# Patient Record
Sex: Female | Born: 1955 | Race: White | Hispanic: No | Marital: Married | Smoking: Never smoker
Health system: Southern US, Community
[De-identification: ages and names within clinical notes are randomized; demographics above are authoritative.]

## PROBLEM LIST (undated history)

## (undated) DIAGNOSIS — R112 Nausea with vomiting, unspecified: Secondary | ICD-10-CM

## (undated) DIAGNOSIS — I499 Cardiac arrhythmia, unspecified: Secondary | ICD-10-CM

## (undated) DIAGNOSIS — T8859XA Other complications of anesthesia, initial encounter: Secondary | ICD-10-CM

## (undated) DIAGNOSIS — R519 Headache, unspecified: Secondary | ICD-10-CM

## (undated) DIAGNOSIS — Z9889 Other specified postprocedural states: Secondary | ICD-10-CM

## (undated) DIAGNOSIS — E079 Disorder of thyroid, unspecified: Secondary | ICD-10-CM

## (undated) DIAGNOSIS — R0600 Dyspnea, unspecified: Secondary | ICD-10-CM

## (undated) DIAGNOSIS — Z87442 Personal history of urinary calculi: Secondary | ICD-10-CM

## (undated) DIAGNOSIS — E039 Hypothyroidism, unspecified: Secondary | ICD-10-CM

## (undated) DIAGNOSIS — M419 Scoliosis, unspecified: Secondary | ICD-10-CM

## (undated) DIAGNOSIS — C801 Malignant (primary) neoplasm, unspecified: Secondary | ICD-10-CM

## (undated) DIAGNOSIS — C50919 Malignant neoplasm of unspecified site of unspecified female breast: Secondary | ICD-10-CM

## (undated) HISTORY — DX: Disorder of thyroid, unspecified: E07.9

## (undated) HISTORY — PX: APPENDECTOMY: SHX54

## (undated) HISTORY — PX: WISDOM TOOTH EXTRACTION: SHX21

## (undated) HISTORY — PX: LAPAROSCOPIC APPENDECTOMY: SUR753

## (undated) HISTORY — PX: COLONOSCOPY: SHX174

## (undated) SURGERY — INSERTION, PLEURAL DRAINAGE CATHETER
Anesthesia: LOCAL | Laterality: Left

---

## 2005-08-05 ENCOUNTER — Encounter: Admission: RE | Admit: 2005-08-05 | Discharge: 2005-08-05 | Payer: Self-pay | Admitting: General Surgery

## 2006-08-11 ENCOUNTER — Encounter: Admission: RE | Admit: 2006-08-11 | Discharge: 2006-08-11 | Payer: Self-pay | Admitting: Unknown Physician Specialty

## 2007-08-18 ENCOUNTER — Encounter: Admission: RE | Admit: 2007-08-18 | Discharge: 2007-08-18 | Payer: Self-pay | Admitting: Unknown Physician Specialty

## 2008-08-27 ENCOUNTER — Encounter: Admission: RE | Admit: 2008-08-27 | Discharge: 2008-08-27 | Payer: Self-pay | Admitting: Unknown Physician Specialty

## 2009-09-05 ENCOUNTER — Encounter: Admission: RE | Admit: 2009-09-05 | Discharge: 2009-09-05 | Payer: Self-pay | Admitting: Unknown Physician Specialty

## 2010-10-29 ENCOUNTER — Encounter
Admission: RE | Admit: 2010-10-29 | Discharge: 2010-10-29 | Payer: Self-pay | Source: Home / Self Care | Attending: Unknown Physician Specialty | Admitting: Unknown Physician Specialty

## 2010-11-20 ENCOUNTER — Encounter: Payer: Self-pay | Admitting: Cardiology

## 2010-11-21 ENCOUNTER — Encounter: Payer: Self-pay | Admitting: Cardiology

## 2010-11-25 ENCOUNTER — Encounter: Payer: Self-pay | Admitting: Cardiology

## 2010-11-25 DIAGNOSIS — I4891 Unspecified atrial fibrillation: Secondary | ICD-10-CM

## 2010-11-27 DIAGNOSIS — R002 Palpitations: Secondary | ICD-10-CM | POA: Insufficient documentation

## 2010-11-30 ENCOUNTER — Encounter: Payer: Self-pay | Admitting: Cardiology

## 2010-11-30 ENCOUNTER — Encounter: Payer: Self-pay | Admitting: *Deleted

## 2010-11-30 ENCOUNTER — Ambulatory Visit (INDEPENDENT_AMBULATORY_CARE_PROVIDER_SITE_OTHER): Payer: BC Managed Care – PPO | Admitting: Cardiology

## 2010-11-30 VITALS — BP 105/74 | HR 68

## 2010-11-30 DIAGNOSIS — I4891 Unspecified atrial fibrillation: Secondary | ICD-10-CM | POA: Insufficient documentation

## 2010-11-30 DIAGNOSIS — E039 Hypothyroidism, unspecified: Secondary | ICD-10-CM | POA: Insufficient documentation

## 2010-11-30 DIAGNOSIS — Z7901 Long term (current) use of anticoagulants: Secondary | ICD-10-CM | POA: Insufficient documentation

## 2010-11-30 NOTE — Assessment & Plan Note (Signed)
I explained to the patient that at this point I would not commit her to long-term medications.  This was a single episode of atrial fibrillation and if it occurs again in the future he can be managed in the same fashion I also told the patient that even episode is no break in approximately 12 hours not to wait any longer than 24 to 48 hours to contact us and we consider chemical cardioversion with a type I C. Antiarrhythmic drug.  Consideration can be given to flecainide or Propafenone. I would also notes to make the patient a long-term antiarrhythmic drug therapy at this point in time.  I explained to her that the first time she ever takes flecainide or any other type of antiarrhythmic agent, this will need to be monitored in the emergency room or in hospital.  Thereafter, she can use the pill in the pocket approach. Briefly approach the subject of atrial fibrillation ablation, pulmonary vein isolation, but this is certainly not an option at this point in time.

## 2010-11-30 NOTE — Progress Notes (Deleted)
Subjective:      Patient ID: Caitlin [name] is a 55 y.o. female.  Chief Complaint: HPIthe patient {Common ambulatory SmartLinks:19316} ROS    Objective:    Physical Exam  Lab Review:  {Recent labs:19471::"not applicable"}    Assessment:     No diagnosis found.   Plan:     ***

## 2010-11-30 NOTE — Progress Notes (Signed)
The patient is a 55 year old female is been referred for evaluation of atrial fibrillation.  She had a recent echocardiogram done.  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 She had laboratory work done, this was essentially within normal limits.  There was a mild elevation of the ALT.  The patient however was hypothyroid with a TSH of 16.640. I reviewed an EKG from 217 2012.  This demonstrated atrial fibrillation with rapid ventricular response without any significant EKG changes.a follow-up EKG from the next day demonstrated normal sinus rhythm with no significant abnormalities.  The patient reports an episode Friday week ago in the day when she felt dizzy and developed palpitations.this was associated with a bad headache.  Her husband Dr. Neita Carp diagnosed her with it her fibrillation with rapid ventricular response.  This was confirmed by an electrocardiogram.  I noted he contacted Dr. Myrtis Ser.  The patient was given short-acting diltiazem 30 mg tablets for her she took 3 tablets then some hours later two tablets in another couple of hours later another two tablets.  Approximately 8 hours after the onset of symptoms the patient reverted back to normal sinus rhythm.  As outlined above this was confirmed on electrocardiogram the next day.   The patient stated he is currently doing well.  She has been started on Synthroid low dose.  She denies any palpitations shortness of breath orthopnea PND.  She said no recurrent episodes.  I reviewed her electrocardiogram and the patient was in normal sinus rhythm.  There is no evidence of preexcitation by EKG.

## 2010-11-30 NOTE — Assessment & Plan Note (Signed)
I discussed with the patient that at this point all she needs is aspirin.  She has no indication for chronic anticoagulation.

## 2010-11-30 NOTE — Progress Notes (Deleted)
Subjective:      Patient ID: Caitlin [name] is a 55 y.o. female.  Chief Complaint: HPI {Common ambulatory SmartLinks:19316} Review of Systems  All other systems reviewed and are negative.      Objective:    Physical Exam  Lab Review:  {Recent labs:19471::"not applicable"}    Assessment:     1. Chronic anticoagulation   2. Atrial fibrillation with RVR   3. Hypothyroidism      Plan:     ***

## 2010-11-30 NOTE — Assessment & Plan Note (Signed)
Synthroid has been started.  This will be further followed by Dr. Neita Carp.

## 2010-11-30 NOTE — Progress Notes (Deleted)
Subjective:      Patient ID: Caitlin [name] is a 55 y.o. female.  Chief Complaint: HPI   ROS    Objective:    Physical Exam  Lab Review:  {Recent labs:19471::"not applicable"}    Assessment:     1. Chronic anticoagulation   2. Atrial fibrillation with RVR   3. Hypothyroidism      Plan:     ***

## 2010-12-10 NOTE — Assessment & Plan Note (Signed)
Summary: NP-ABN EKG  AFIB PER DEGENT ADD THIS DAY-SRS   Visit Type:  Initial Consult Primary Provider:  Guido Sander   History of Present Illness: Note was done in EPIC.   put nothing in his  Preventive Screening-Counseling & Management  Alcohol-Tobacco     Smoking Status: never  Current Medications (verified): 1)  Synthroid 25 Mcg Tabs (Levothyroxine Sodium) .... Take 1 Tablet By Mouth Once A Day 2)  Aspir-Low 81 Mg Tbec (Aspirin) .... Take 1 Tablet By Mouth Once A Day 3)  Advair Hfa 115-21 Mcg/act Aero (Fluticasone-Salmeterol) .... As Needed  Allergies (verified): 1)  ! Pcn  Comments:  Nurse/Medical Assistant: The patient's medications and allergies were verbally reviewed with the patient and were updated in the Medication and Allergy Lists.  Past History:  Past Medical History: Last updated: 11/27/2010 Palpitations  Social History: Smoking Status:  never  Vital Signs:  Patient profile:   55 year old female Height:      65 inches Weight:      167 pounds BMI:     27.89 O2 Sat:      97 % on Room air Pulse rate:   68 / minute BP sitting:   105 / 74  (left arm) Cuff size:   regular  Vitals Entered By: Carlye Grippe (November 30, 2010 10:09 AM)  Nutrition Counseling: [name] BMI is greater than 25 and therefore counseled on weight management options.  O2 Flow:  Room air   Other Orders: EKG w/ Interpretation (93000)  Patient Instructions: 1)  Your physician recommends that you continue on your current medications as directed. Please refer to the Current Medication list given to you today. 2)  Follow up in  6 months

## 2011-12-13 ENCOUNTER — Other Ambulatory Visit: Payer: Self-pay | Admitting: Unknown Physician Specialty

## 2011-12-13 DIAGNOSIS — Z1231 Encounter for screening mammogram for malignant neoplasm of breast: Secondary | ICD-10-CM

## 2012-06-09 ENCOUNTER — Ambulatory Visit
Admission: RE | Admit: 2012-06-09 | Discharge: 2012-06-09 | Disposition: A | Discharge status: Home / Self Care | Payer: BC Managed Care – PPO | Source: Ambulatory Visit | Attending: Unknown Physician Specialty | Admitting: Unknown Physician Specialty

## 2012-06-09 DIAGNOSIS — Z1231 Encounter for screening mammogram for malignant neoplasm of breast: Secondary | ICD-10-CM

## 2013-06-07 ENCOUNTER — Other Ambulatory Visit: Payer: Self-pay

## 2013-06-07 DIAGNOSIS — Z1231 Encounter for screening mammogram for malignant neoplasm of breast: Secondary | ICD-10-CM

## 2013-06-29 ENCOUNTER — Ambulatory Visit
Admission: RE | Admit: 2013-06-29 | Discharge: 2013-06-29 | Disposition: A | Discharge status: Home / Self Care | Payer: BC Managed Care – PPO | Source: Ambulatory Visit

## 2013-06-29 DIAGNOSIS — Z1231 Encounter for screening mammogram for malignant neoplasm of breast: Secondary | ICD-10-CM

## 2014-06-06 ENCOUNTER — Other Ambulatory Visit: Payer: Self-pay

## 2014-06-06 DIAGNOSIS — Z1231 Encounter for screening mammogram for malignant neoplasm of breast: Secondary | ICD-10-CM

## 2014-07-01 ENCOUNTER — Ambulatory Visit
Admission: RE | Admit: 2014-07-01 | Discharge: 2014-07-01 | Disposition: A | Discharge status: Home / Self Care | Payer: BC Managed Care – PPO | Source: Ambulatory Visit

## 2014-07-01 DIAGNOSIS — Z1231 Encounter for screening mammogram for malignant neoplasm of breast: Secondary | ICD-10-CM

## 2014-07-02 ENCOUNTER — Other Ambulatory Visit: Payer: Self-pay | Admitting: Unknown Physician Specialty

## 2014-07-02 DIAGNOSIS — R928 Other abnormal and inconclusive findings on diagnostic imaging of breast: Secondary | ICD-10-CM

## 2014-07-08 ENCOUNTER — Ambulatory Visit
Admission: RE | Admit: 2014-07-08 | Discharge: 2014-07-08 | Disposition: A | Discharge status: Home / Self Care | Payer: BC Managed Care – PPO | Source: Ambulatory Visit | Attending: Unknown Physician Specialty | Admitting: Unknown Physician Specialty

## 2014-07-08 DIAGNOSIS — R928 Other abnormal and inconclusive findings on diagnostic imaging of breast: Secondary | ICD-10-CM

## 2015-06-13 ENCOUNTER — Other Ambulatory Visit: Payer: Self-pay

## 2015-06-13 DIAGNOSIS — Z1231 Encounter for screening mammogram for malignant neoplasm of breast: Secondary | ICD-10-CM

## 2015-07-15 ENCOUNTER — Ambulatory Visit
Admission: RE | Admit: 2015-07-15 | Discharge: 2015-07-15 | Disposition: A | Discharge status: Home / Self Care | Payer: BLUE CROSS/BLUE SHIELD | Source: Ambulatory Visit

## 2015-07-15 DIAGNOSIS — Z1231 Encounter for screening mammogram for malignant neoplasm of breast: Secondary | ICD-10-CM

## 2016-03-09 DIAGNOSIS — L821 Other seborrheic keratosis: Secondary | ICD-10-CM | POA: Diagnosis not present

## 2016-07-20 DIAGNOSIS — Z23 Encounter for immunization: Secondary | ICD-10-CM | POA: Diagnosis not present

## 2016-11-29 ENCOUNTER — Other Ambulatory Visit: Payer: Self-pay | Admitting: Unknown Physician Specialty

## 2016-11-29 DIAGNOSIS — Z1231 Encounter for screening mammogram for malignant neoplasm of breast: Secondary | ICD-10-CM

## 2016-12-13 ENCOUNTER — Ambulatory Visit
Admission: RE | Admit: 2016-12-13 | Discharge: 2016-12-13 | Disposition: A | Discharge status: Home / Self Care | Payer: BLUE CROSS/BLUE SHIELD | Source: Ambulatory Visit | Attending: Unknown Physician Specialty | Admitting: Unknown Physician Specialty

## 2016-12-13 DIAGNOSIS — Z1231 Encounter for screening mammogram for malignant neoplasm of breast: Secondary | ICD-10-CM

## 2017-01-27 DIAGNOSIS — H4311 Vitreous hemorrhage, right eye: Secondary | ICD-10-CM | POA: Diagnosis not present

## 2017-02-08 DIAGNOSIS — L820 Inflamed seborrheic keratosis: Secondary | ICD-10-CM | POA: Diagnosis not present

## 2017-02-14 DIAGNOSIS — H43811 Vitreous degeneration, right eye: Secondary | ICD-10-CM | POA: Diagnosis not present

## 2017-02-14 DIAGNOSIS — H2513 Age-related nuclear cataract, bilateral: Secondary | ICD-10-CM | POA: Diagnosis not present

## 2017-02-16 DIAGNOSIS — H6121 Impacted cerumen, right ear: Secondary | ICD-10-CM | POA: Diagnosis not present

## 2017-02-16 DIAGNOSIS — H9203 Otalgia, bilateral: Secondary | ICD-10-CM | POA: Diagnosis not present

## 2017-02-16 DIAGNOSIS — Z6826 Body mass index (BMI) 26.0-26.9, adult: Secondary | ICD-10-CM | POA: Diagnosis not present

## 2017-04-04 DIAGNOSIS — Z79899 Other long term (current) drug therapy: Secondary | ICD-10-CM | POA: Diagnosis not present

## 2017-04-04 DIAGNOSIS — K37 Unspecified appendicitis: Secondary | ICD-10-CM | POA: Diagnosis not present

## 2017-04-04 DIAGNOSIS — Z7951 Long term (current) use of inhaled steroids: Secondary | ICD-10-CM | POA: Diagnosis not present

## 2017-04-04 DIAGNOSIS — R1013 Epigastric pain: Secondary | ICD-10-CM | POA: Diagnosis not present

## 2017-04-04 DIAGNOSIS — M419 Scoliosis, unspecified: Secondary | ICD-10-CM | POA: Diagnosis not present

## 2017-04-04 DIAGNOSIS — R1031 Right lower quadrant pain: Secondary | ICD-10-CM | POA: Diagnosis not present

## 2017-04-04 DIAGNOSIS — J4530 Mild persistent asthma, uncomplicated: Secondary | ICD-10-CM | POA: Diagnosis not present

## 2017-04-04 DIAGNOSIS — Z880 Allergy status to penicillin: Secondary | ICD-10-CM | POA: Diagnosis not present

## 2017-04-04 DIAGNOSIS — N200 Calculus of kidney: Secondary | ICD-10-CM | POA: Diagnosis not present

## 2017-04-04 DIAGNOSIS — R1909 Other intra-abdominal and pelvic swelling, mass and lump: Secondary | ICD-10-CM | POA: Diagnosis not present

## 2017-04-04 DIAGNOSIS — I700 Atherosclerosis of aorta: Secondary | ICD-10-CM | POA: Diagnosis not present

## 2017-04-04 DIAGNOSIS — K353 Acute appendicitis with localized peritonitis: Secondary | ICD-10-CM | POA: Diagnosis not present

## 2017-04-04 DIAGNOSIS — Z8042 Family history of malignant neoplasm of prostate: Secondary | ICD-10-CM | POA: Diagnosis not present

## 2017-04-04 DIAGNOSIS — K3580 Unspecified acute appendicitis: Secondary | ICD-10-CM | POA: Diagnosis not present

## 2017-04-04 DIAGNOSIS — E039 Hypothyroidism, unspecified: Secondary | ICD-10-CM | POA: Diagnosis not present

## 2017-04-04 DIAGNOSIS — I480 Paroxysmal atrial fibrillation: Secondary | ICD-10-CM | POA: Diagnosis not present

## 2017-04-04 DIAGNOSIS — G250 Essential tremor: Secondary | ICD-10-CM | POA: Diagnosis not present

## 2017-04-04 DIAGNOSIS — M7989 Other specified soft tissue disorders: Secondary | ICD-10-CM | POA: Diagnosis not present

## 2017-04-04 DIAGNOSIS — Z6826 Body mass index (BMI) 26.0-26.9, adult: Secondary | ICD-10-CM | POA: Diagnosis not present

## 2017-04-05 DIAGNOSIS — K3580 Unspecified acute appendicitis: Secondary | ICD-10-CM | POA: Diagnosis not present

## 2017-04-05 DIAGNOSIS — Z880 Allergy status to penicillin: Secondary | ICD-10-CM | POA: Diagnosis not present

## 2017-04-05 DIAGNOSIS — Z7951 Long term (current) use of inhaled steroids: Secondary | ICD-10-CM | POA: Diagnosis not present

## 2017-04-05 DIAGNOSIS — E039 Hypothyroidism, unspecified: Secondary | ICD-10-CM | POA: Diagnosis not present

## 2017-04-05 DIAGNOSIS — J4530 Mild persistent asthma, uncomplicated: Secondary | ICD-10-CM | POA: Diagnosis not present

## 2017-04-05 DIAGNOSIS — I480 Paroxysmal atrial fibrillation: Secondary | ICD-10-CM | POA: Diagnosis not present

## 2017-04-05 DIAGNOSIS — G250 Essential tremor: Secondary | ICD-10-CM | POA: Diagnosis not present

## 2017-04-05 DIAGNOSIS — Z79899 Other long term (current) drug therapy: Secondary | ICD-10-CM | POA: Diagnosis not present

## 2017-04-05 DIAGNOSIS — R1909 Other intra-abdominal and pelvic swelling, mass and lump: Secondary | ICD-10-CM | POA: Diagnosis not present

## 2017-04-05 DIAGNOSIS — Z8042 Family history of malignant neoplasm of prostate: Secondary | ICD-10-CM | POA: Diagnosis not present

## 2017-04-05 DIAGNOSIS — K353 Acute appendicitis with localized peritonitis: Secondary | ICD-10-CM | POA: Diagnosis not present

## 2017-06-30 DIAGNOSIS — H00012 Hordeolum externum right lower eyelid: Secondary | ICD-10-CM | POA: Diagnosis not present

## 2017-07-01 DIAGNOSIS — L570 Actinic keratosis: Secondary | ICD-10-CM | POA: Diagnosis not present

## 2017-07-01 DIAGNOSIS — D485 Neoplasm of uncertain behavior of skin: Secondary | ICD-10-CM | POA: Diagnosis not present

## 2017-07-01 DIAGNOSIS — L821 Other seborrheic keratosis: Secondary | ICD-10-CM | POA: Diagnosis not present

## 2017-07-04 DIAGNOSIS — H00012 Hordeolum externum right lower eyelid: Secondary | ICD-10-CM | POA: Diagnosis not present

## 2017-07-18 DIAGNOSIS — Z23 Encounter for immunization: Secondary | ICD-10-CM | POA: Diagnosis not present

## 2017-08-01 DIAGNOSIS — Z13220 Encounter for screening for lipoid disorders: Secondary | ICD-10-CM | POA: Diagnosis not present

## 2017-08-01 DIAGNOSIS — E039 Hypothyroidism, unspecified: Secondary | ICD-10-CM | POA: Diagnosis not present

## 2017-08-01 DIAGNOSIS — R5382 Chronic fatigue, unspecified: Secondary | ICD-10-CM | POA: Diagnosis not present

## 2018-05-26 DIAGNOSIS — H00025 Hordeolum internum left lower eyelid: Secondary | ICD-10-CM | POA: Diagnosis not present

## 2018-06-13 ENCOUNTER — Other Ambulatory Visit: Payer: Self-pay | Admitting: Unknown Physician Specialty

## 2018-06-13 DIAGNOSIS — Z1231 Encounter for screening mammogram for malignant neoplasm of breast: Secondary | ICD-10-CM

## 2018-07-10 ENCOUNTER — Ambulatory Visit
Admission: RE | Admit: 2018-07-10 | Discharge: 2018-07-10 | Disposition: A | Discharge status: Home / Self Care | Payer: BLUE CROSS/BLUE SHIELD | Source: Ambulatory Visit | Attending: Unknown Physician Specialty | Admitting: Unknown Physician Specialty

## 2018-07-10 ENCOUNTER — Encounter (INDEPENDENT_AMBULATORY_CARE_PROVIDER_SITE_OTHER): Payer: Self-pay

## 2018-07-10 DIAGNOSIS — Z23 Encounter for immunization: Secondary | ICD-10-CM | POA: Diagnosis not present

## 2018-07-10 DIAGNOSIS — Z1231 Encounter for screening mammogram for malignant neoplasm of breast: Secondary | ICD-10-CM | POA: Diagnosis not present

## 2018-07-17 DIAGNOSIS — Z01419 Encounter for gynecological examination (general) (routine) without abnormal findings: Secondary | ICD-10-CM | POA: Diagnosis not present

## 2018-07-17 DIAGNOSIS — Z6826 Body mass index (BMI) 26.0-26.9, adult: Secondary | ICD-10-CM | POA: Diagnosis not present

## 2018-08-07 DIAGNOSIS — M9906 Segmental and somatic dysfunction of lower extremity: Secondary | ICD-10-CM | POA: Diagnosis not present

## 2018-08-07 DIAGNOSIS — M9901 Segmental and somatic dysfunction of cervical region: Secondary | ICD-10-CM | POA: Diagnosis not present

## 2018-08-07 DIAGNOSIS — M9905 Segmental and somatic dysfunction of pelvic region: Secondary | ICD-10-CM | POA: Diagnosis not present

## 2018-08-07 DIAGNOSIS — M4126 Other idiopathic scoliosis, lumbar region: Secondary | ICD-10-CM | POA: Diagnosis not present

## 2018-08-28 DIAGNOSIS — S335XXA Sprain of ligaments of lumbar spine, initial encounter: Secondary | ICD-10-CM | POA: Diagnosis not present

## 2018-08-28 DIAGNOSIS — S134XXA Sprain of ligaments of cervical spine, initial encounter: Secondary | ICD-10-CM | POA: Diagnosis not present

## 2018-08-28 DIAGNOSIS — M4125 Other idiopathic scoliosis, thoracolumbar region: Secondary | ICD-10-CM | POA: Diagnosis not present

## 2018-08-30 DIAGNOSIS — S335XXA Sprain of ligaments of lumbar spine, initial encounter: Secondary | ICD-10-CM | POA: Diagnosis not present

## 2018-08-30 DIAGNOSIS — M4125 Other idiopathic scoliosis, thoracolumbar region: Secondary | ICD-10-CM | POA: Diagnosis not present

## 2018-08-30 DIAGNOSIS — S134XXA Sprain of ligaments of cervical spine, initial encounter: Secondary | ICD-10-CM | POA: Diagnosis not present

## 2018-09-04 DIAGNOSIS — M4125 Other idiopathic scoliosis, thoracolumbar region: Secondary | ICD-10-CM | POA: Diagnosis not present

## 2018-09-04 DIAGNOSIS — S335XXA Sprain of ligaments of lumbar spine, initial encounter: Secondary | ICD-10-CM | POA: Diagnosis not present

## 2018-09-04 DIAGNOSIS — S134XXA Sprain of ligaments of cervical spine, initial encounter: Secondary | ICD-10-CM | POA: Diagnosis not present

## 2018-09-11 DIAGNOSIS — M4125 Other idiopathic scoliosis, thoracolumbar region: Secondary | ICD-10-CM | POA: Diagnosis not present

## 2018-09-11 DIAGNOSIS — S335XXA Sprain of ligaments of lumbar spine, initial encounter: Secondary | ICD-10-CM | POA: Diagnosis not present

## 2018-09-11 DIAGNOSIS — S134XXA Sprain of ligaments of cervical spine, initial encounter: Secondary | ICD-10-CM | POA: Diagnosis not present

## 2018-09-13 DIAGNOSIS — M4125 Other idiopathic scoliosis, thoracolumbar region: Secondary | ICD-10-CM | POA: Diagnosis not present

## 2018-09-13 DIAGNOSIS — S134XXA Sprain of ligaments of cervical spine, initial encounter: Secondary | ICD-10-CM | POA: Diagnosis not present

## 2018-09-13 DIAGNOSIS — S335XXA Sprain of ligaments of lumbar spine, initial encounter: Secondary | ICD-10-CM | POA: Diagnosis not present

## 2018-09-18 DIAGNOSIS — M4125 Other idiopathic scoliosis, thoracolumbar region: Secondary | ICD-10-CM | POA: Diagnosis not present

## 2018-09-18 DIAGNOSIS — S134XXA Sprain of ligaments of cervical spine, initial encounter: Secondary | ICD-10-CM | POA: Diagnosis not present

## 2018-09-18 DIAGNOSIS — S335XXA Sprain of ligaments of lumbar spine, initial encounter: Secondary | ICD-10-CM | POA: Diagnosis not present

## 2018-09-20 DIAGNOSIS — M4125 Other idiopathic scoliosis, thoracolumbar region: Secondary | ICD-10-CM | POA: Diagnosis not present

## 2018-09-20 DIAGNOSIS — S335XXA Sprain of ligaments of lumbar spine, initial encounter: Secondary | ICD-10-CM | POA: Diagnosis not present

## 2018-09-20 DIAGNOSIS — S134XXA Sprain of ligaments of cervical spine, initial encounter: Secondary | ICD-10-CM | POA: Diagnosis not present

## 2018-10-09 DIAGNOSIS — M4125 Other idiopathic scoliosis, thoracolumbar region: Secondary | ICD-10-CM | POA: Diagnosis not present

## 2018-10-09 DIAGNOSIS — S335XXA Sprain of ligaments of lumbar spine, initial encounter: Secondary | ICD-10-CM | POA: Diagnosis not present

## 2018-10-09 DIAGNOSIS — S134XXA Sprain of ligaments of cervical spine, initial encounter: Secondary | ICD-10-CM | POA: Diagnosis not present

## 2018-10-12 DIAGNOSIS — S134XXA Sprain of ligaments of cervical spine, initial encounter: Secondary | ICD-10-CM | POA: Diagnosis not present

## 2018-10-12 DIAGNOSIS — M4125 Other idiopathic scoliosis, thoracolumbar region: Secondary | ICD-10-CM | POA: Diagnosis not present

## 2018-10-12 DIAGNOSIS — S335XXA Sprain of ligaments of lumbar spine, initial encounter: Secondary | ICD-10-CM | POA: Diagnosis not present

## 2018-10-19 DIAGNOSIS — M4125 Other idiopathic scoliosis, thoracolumbar region: Secondary | ICD-10-CM | POA: Diagnosis not present

## 2018-10-19 DIAGNOSIS — S335XXA Sprain of ligaments of lumbar spine, initial encounter: Secondary | ICD-10-CM | POA: Diagnosis not present

## 2018-10-19 DIAGNOSIS — S134XXA Sprain of ligaments of cervical spine, initial encounter: Secondary | ICD-10-CM | POA: Diagnosis not present

## 2018-10-26 DIAGNOSIS — S335XXA Sprain of ligaments of lumbar spine, initial encounter: Secondary | ICD-10-CM | POA: Diagnosis not present

## 2018-10-26 DIAGNOSIS — S134XXA Sprain of ligaments of cervical spine, initial encounter: Secondary | ICD-10-CM | POA: Diagnosis not present

## 2018-10-26 DIAGNOSIS — M4125 Other idiopathic scoliosis, thoracolumbar region: Secondary | ICD-10-CM | POA: Diagnosis not present

## 2018-10-26 DIAGNOSIS — H2513 Age-related nuclear cataract, bilateral: Secondary | ICD-10-CM | POA: Diagnosis not present

## 2018-11-02 DIAGNOSIS — M4125 Other idiopathic scoliosis, thoracolumbar region: Secondary | ICD-10-CM | POA: Diagnosis not present

## 2018-11-02 DIAGNOSIS — S335XXA Sprain of ligaments of lumbar spine, initial encounter: Secondary | ICD-10-CM | POA: Diagnosis not present

## 2018-11-02 DIAGNOSIS — S134XXA Sprain of ligaments of cervical spine, initial encounter: Secondary | ICD-10-CM | POA: Diagnosis not present

## 2018-11-09 DIAGNOSIS — S134XXA Sprain of ligaments of cervical spine, initial encounter: Secondary | ICD-10-CM | POA: Diagnosis not present

## 2018-11-09 DIAGNOSIS — S335XXA Sprain of ligaments of lumbar spine, initial encounter: Secondary | ICD-10-CM | POA: Diagnosis not present

## 2018-11-09 DIAGNOSIS — M4125 Other idiopathic scoliosis, thoracolumbar region: Secondary | ICD-10-CM | POA: Diagnosis not present

## 2018-11-16 DIAGNOSIS — M4125 Other idiopathic scoliosis, thoracolumbar region: Secondary | ICD-10-CM | POA: Diagnosis not present

## 2018-11-16 DIAGNOSIS — S134XXA Sprain of ligaments of cervical spine, initial encounter: Secondary | ICD-10-CM | POA: Diagnosis not present

## 2018-11-16 DIAGNOSIS — S335XXA Sprain of ligaments of lumbar spine, initial encounter: Secondary | ICD-10-CM | POA: Diagnosis not present

## 2018-11-22 DIAGNOSIS — S335XXA Sprain of ligaments of lumbar spine, initial encounter: Secondary | ICD-10-CM | POA: Diagnosis not present

## 2018-11-22 DIAGNOSIS — S134XXA Sprain of ligaments of cervical spine, initial encounter: Secondary | ICD-10-CM | POA: Diagnosis not present

## 2018-11-22 DIAGNOSIS — M4125 Other idiopathic scoliosis, thoracolumbar region: Secondary | ICD-10-CM | POA: Diagnosis not present

## 2018-11-30 DIAGNOSIS — S335XXA Sprain of ligaments of lumbar spine, initial encounter: Secondary | ICD-10-CM | POA: Diagnosis not present

## 2018-11-30 DIAGNOSIS — S134XXA Sprain of ligaments of cervical spine, initial encounter: Secondary | ICD-10-CM | POA: Diagnosis not present

## 2018-11-30 DIAGNOSIS — M4125 Other idiopathic scoliosis, thoracolumbar region: Secondary | ICD-10-CM | POA: Diagnosis not present

## 2018-12-07 DIAGNOSIS — M4125 Other idiopathic scoliosis, thoracolumbar region: Secondary | ICD-10-CM | POA: Diagnosis not present

## 2018-12-07 DIAGNOSIS — S134XXA Sprain of ligaments of cervical spine, initial encounter: Secondary | ICD-10-CM | POA: Diagnosis not present

## 2018-12-07 DIAGNOSIS — S335XXA Sprain of ligaments of lumbar spine, initial encounter: Secondary | ICD-10-CM | POA: Diagnosis not present

## 2018-12-21 DIAGNOSIS — S134XXA Sprain of ligaments of cervical spine, initial encounter: Secondary | ICD-10-CM | POA: Diagnosis not present

## 2018-12-21 DIAGNOSIS — S335XXA Sprain of ligaments of lumbar spine, initial encounter: Secondary | ICD-10-CM | POA: Diagnosis not present

## 2018-12-21 DIAGNOSIS — M4125 Other idiopathic scoliosis, thoracolumbar region: Secondary | ICD-10-CM | POA: Diagnosis not present

## 2019-01-04 DIAGNOSIS — S134XXA Sprain of ligaments of cervical spine, initial encounter: Secondary | ICD-10-CM | POA: Diagnosis not present

## 2019-01-04 DIAGNOSIS — S335XXA Sprain of ligaments of lumbar spine, initial encounter: Secondary | ICD-10-CM | POA: Diagnosis not present

## 2019-01-04 DIAGNOSIS — M4125 Other idiopathic scoliosis, thoracolumbar region: Secondary | ICD-10-CM | POA: Diagnosis not present

## 2019-01-18 DIAGNOSIS — M4125 Other idiopathic scoliosis, thoracolumbar region: Secondary | ICD-10-CM | POA: Diagnosis not present

## 2019-01-18 DIAGNOSIS — S335XXA Sprain of ligaments of lumbar spine, initial encounter: Secondary | ICD-10-CM | POA: Diagnosis not present

## 2019-01-18 DIAGNOSIS — S134XXA Sprain of ligaments of cervical spine, initial encounter: Secondary | ICD-10-CM | POA: Diagnosis not present

## 2019-02-01 DIAGNOSIS — M4125 Other idiopathic scoliosis, thoracolumbar region: Secondary | ICD-10-CM | POA: Diagnosis not present

## 2019-02-01 DIAGNOSIS — S134XXA Sprain of ligaments of cervical spine, initial encounter: Secondary | ICD-10-CM | POA: Diagnosis not present

## 2019-02-01 DIAGNOSIS — S335XXA Sprain of ligaments of lumbar spine, initial encounter: Secondary | ICD-10-CM | POA: Diagnosis not present

## 2019-07-23 DIAGNOSIS — Z23 Encounter for immunization: Secondary | ICD-10-CM | POA: Diagnosis not present

## 2019-11-17 ENCOUNTER — Other Ambulatory Visit: Payer: Self-pay

## 2019-11-17 ENCOUNTER — Ambulatory Visit: Payer: Self-pay | Attending: Internal Medicine

## 2019-11-17 DIAGNOSIS — Z23 Encounter for immunization: Secondary | ICD-10-CM | POA: Insufficient documentation

## 2019-11-17 NOTE — Progress Notes (Signed)
   Covid-19 Vaccination Clinic  Name:  Caitlin [name]    MRN: [id number] DOB: [birth year]  11/17/2019  Caitlin [name] was observed post Covid-19 immunization for 15 minutes without incidence. She was provided with Vaccine Information Sheet and instruction to access the V-Safe system.   Caitlin [name] was instructed to call 911 with any severe reactions post vaccine: Marland Kitchen Difficulty breathing  . Swelling of your face and throat  . A fast heartbeat  . A bad rash all over your body  . Dizziness and weakness    Immunizations Administered    Name Date Dose VIS Date Route   Moderna COVID-19 Vaccine 11/17/2019 11:44 AM 0.5 mL 09/04/2019 Intramuscular   Manufacturer: Moderna   Lot: [id number]   [id number]

## 2019-12-15 ENCOUNTER — Ambulatory Visit: Payer: Self-pay | Attending: Internal Medicine

## 2019-12-15 DIAGNOSIS — Z23 Encounter for immunization: Secondary | ICD-10-CM

## 2019-12-15 NOTE — Progress Notes (Signed)
   Covid-19 Vaccination Clinic  Name:  Caitlin [name]    MRN: [id number] DOB: [birth year]  12/15/2019  [name] was observed post Covid-19 immunization for 15 minutes without incident. She was provided with Vaccine Information Sheet and instruction to access the V-Safe system.   [name] was instructed to call 911 with any severe reactions post vaccine: Marland Kitchen Difficulty breathing  . Swelling of face and throat  . A fast heartbeat  . A bad rash all over body  . Dizziness and weakness   Immunizations Administered    Name Date Dose VIS Date Route   Pfizer COVID-19 Vaccine 12/15/2019 10:03 AM 0.3 mL 09/14/2019 Intramuscular   Manufacturer: [place]   Lot: [id number]   [place]: [id number]

## 2020-02-14 ENCOUNTER — Other Ambulatory Visit: Payer: Self-pay | Admitting: Unknown Physician Specialty

## 2020-02-14 DIAGNOSIS — Z1231 Encounter for screening mammogram for malignant neoplasm of breast: Secondary | ICD-10-CM

## 2020-02-27 ENCOUNTER — Ambulatory Visit
Admission: RE | Admit: 2020-02-27 | Discharge: 2020-02-27 | Disposition: A | Discharge status: Home / Self Care | Payer: BC Managed Care – PPO | Source: Ambulatory Visit | Attending: Unknown Physician Specialty | Admitting: Unknown Physician Specialty

## 2020-02-27 ENCOUNTER — Other Ambulatory Visit: Payer: Self-pay

## 2020-02-27 DIAGNOSIS — Z1231 Encounter for screening mammogram for malignant neoplasm of breast: Secondary | ICD-10-CM | POA: Diagnosis not present

## 2020-05-07 DIAGNOSIS — Z20828 Contact with and (suspected) exposure to other viral communicable diseases: Secondary | ICD-10-CM | POA: Diagnosis not present

## 2020-06-19 DIAGNOSIS — Z6824 Body mass index (BMI) 24.0-24.9, adult: Secondary | ICD-10-CM | POA: Diagnosis not present

## 2020-06-19 DIAGNOSIS — Z01419 Encounter for gynecological examination (general) (routine) without abnormal findings: Secondary | ICD-10-CM | POA: Diagnosis not present

## 2020-09-02 DIAGNOSIS — R300 Dysuria: Secondary | ICD-10-CM | POA: Diagnosis not present

## 2020-09-10 DIAGNOSIS — Z23 Encounter for immunization: Secondary | ICD-10-CM | POA: Diagnosis not present

## 2020-09-16 DIAGNOSIS — Z23 Encounter for immunization: Secondary | ICD-10-CM | POA: Diagnosis not present

## 2021-04-27 ENCOUNTER — Other Ambulatory Visit: Payer: Self-pay | Admitting: Family Medicine

## 2021-04-27 DIAGNOSIS — Z1231 Encounter for screening mammogram for malignant neoplasm of breast: Secondary | ICD-10-CM

## 2021-06-17 ENCOUNTER — Other Ambulatory Visit: Payer: Self-pay

## 2021-06-17 ENCOUNTER — Ambulatory Visit
Admission: RE | Admit: 2021-06-17 | Discharge: 2021-06-17 | Disposition: A | Discharge status: Home / Self Care | Payer: BC Managed Care – PPO | Source: Ambulatory Visit | Attending: Family Medicine | Admitting: Family Medicine

## 2021-06-17 DIAGNOSIS — Z1231 Encounter for screening mammogram for malignant neoplasm of breast: Secondary | ICD-10-CM

## 2021-09-18 IMAGING — MG DIGITAL SCREENING BILAT W/ TOMO W/ CAD
8 series · 9 of 24 positions shown · non-contrast
Comparison: Previous exam(s).

CLINICAL DATA: Screening.

EXAM:
DIGITAL SCREENING BILATERAL MAMMOGRAM WITH TOMO AND CAD

[L MLO synth-2D]
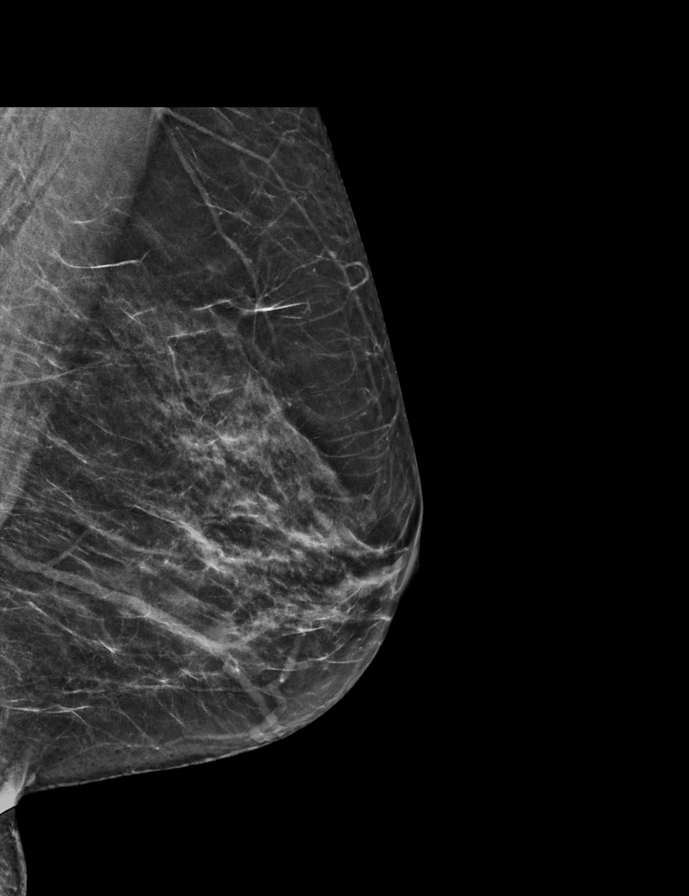

[R MLO synth-2D]
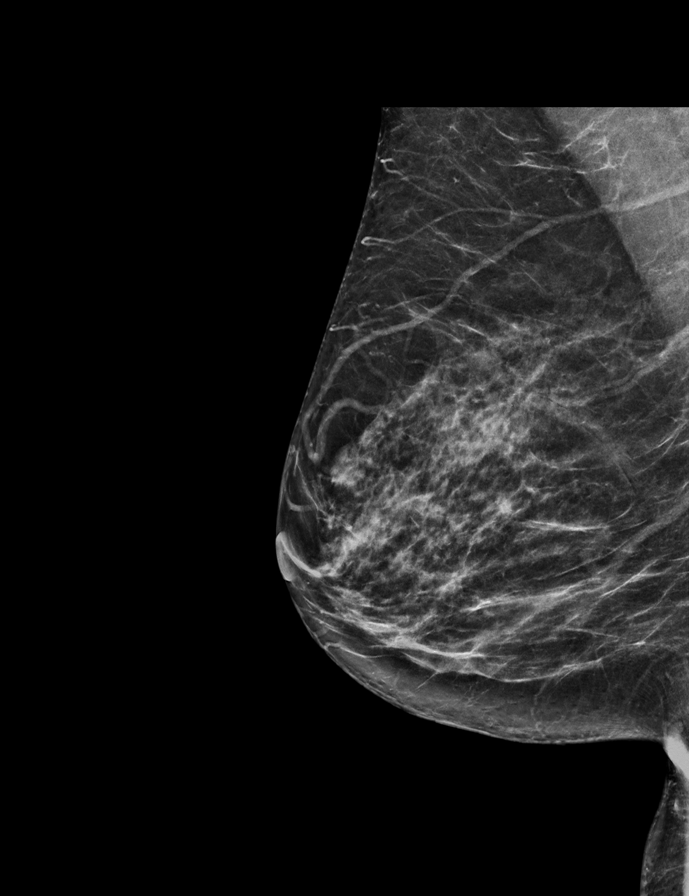

[R CC synth-2D]
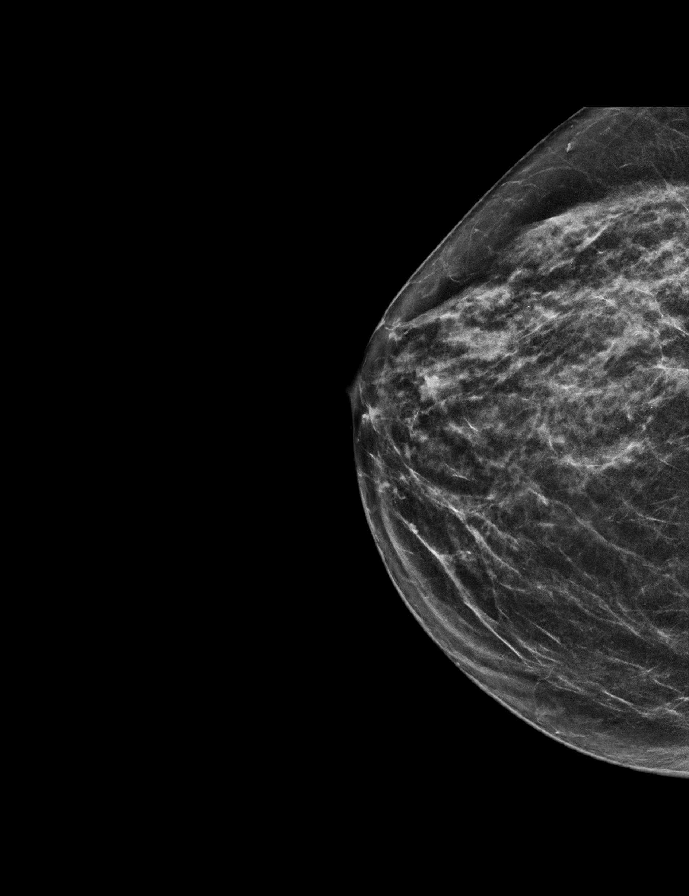

[L CC synth-2D]
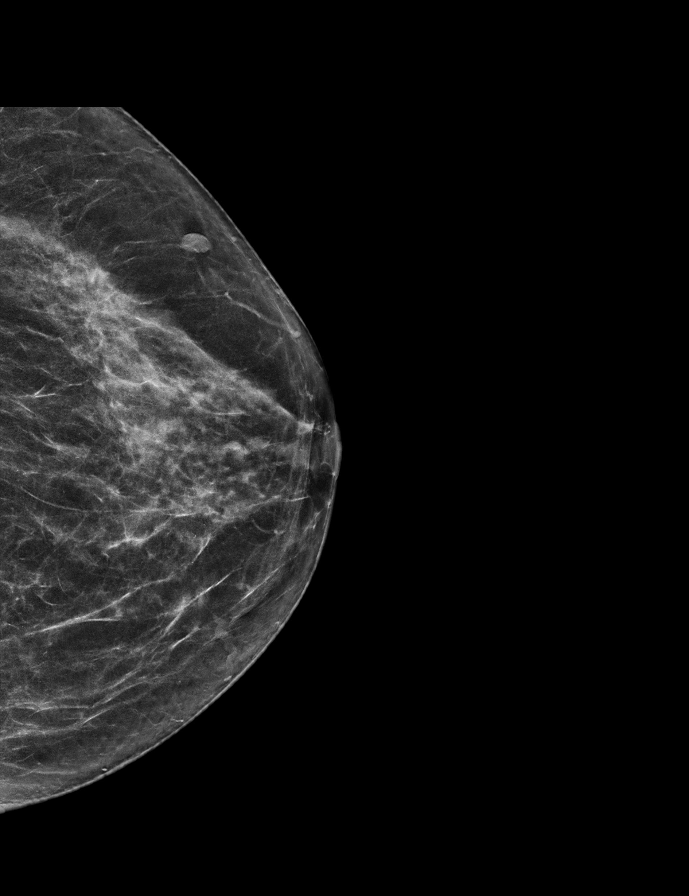

[L CC tomo · 2 of 59 frames shown]
[frame 20/59]
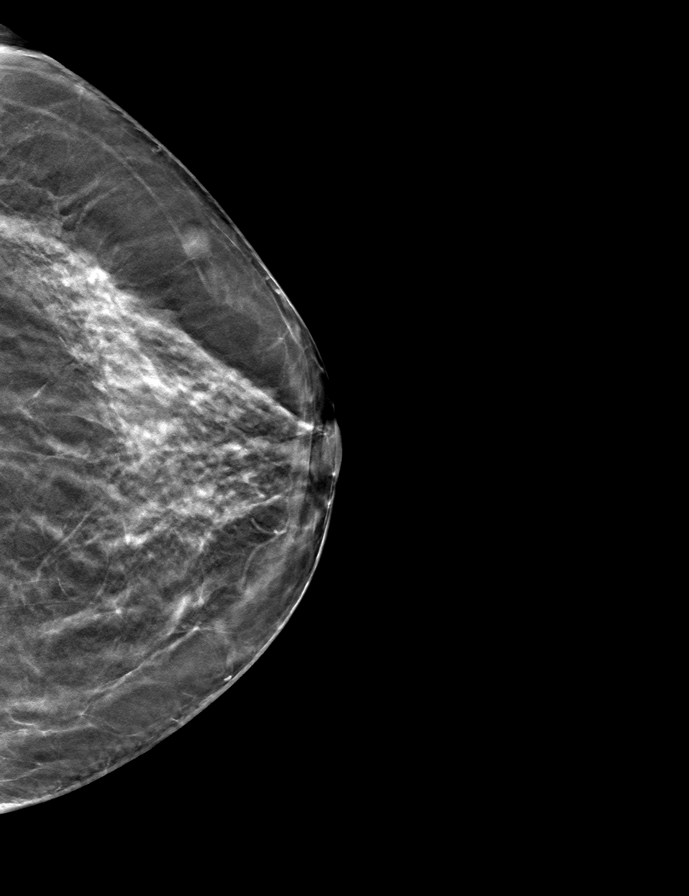
[frame 30/59]
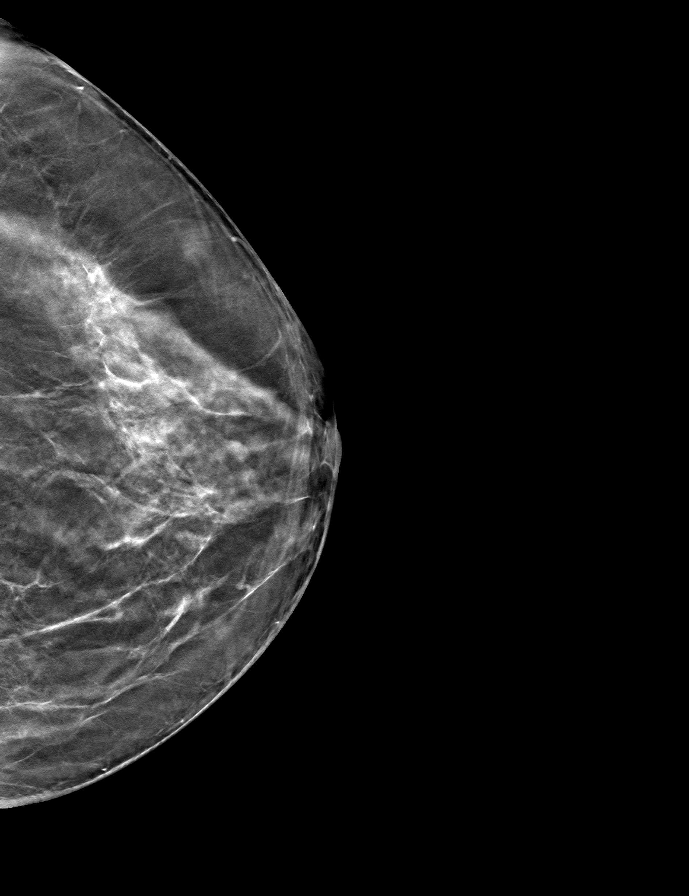

[R CC tomo · tomo slice 28/55.0]
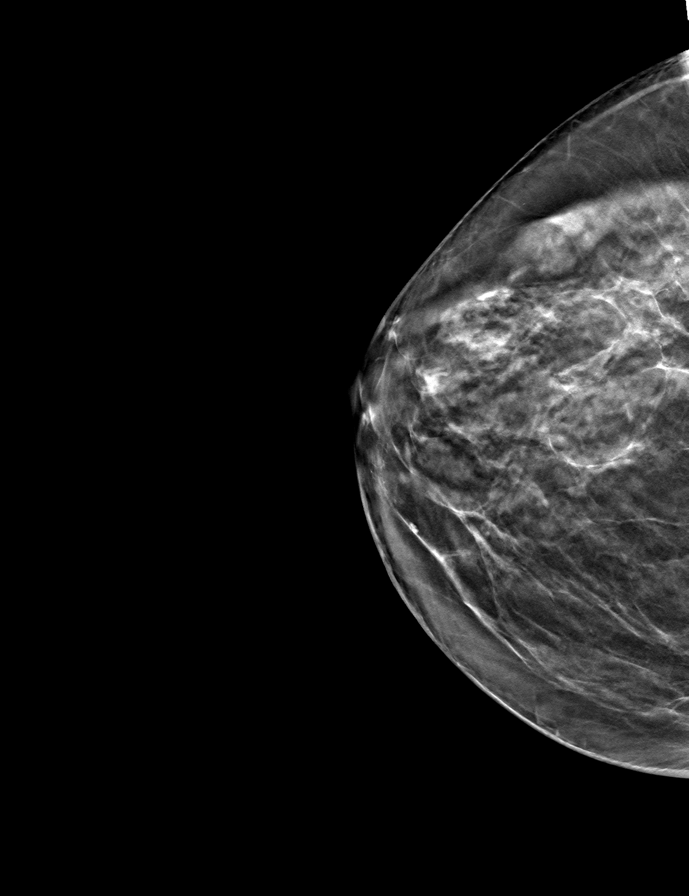

[R MLO tomo · tomo slice 31/60.0]
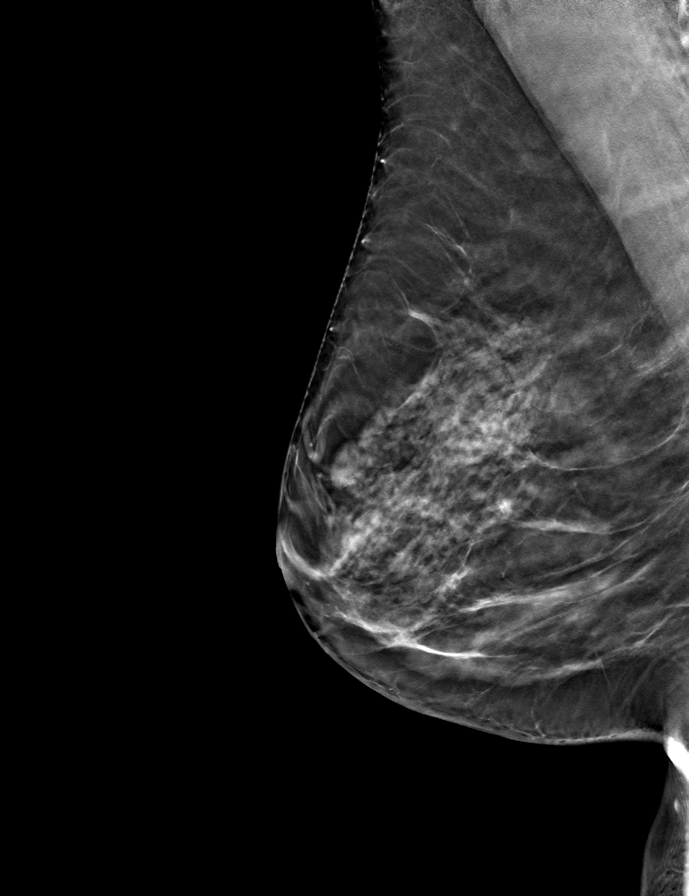

[L MLO tomo · tomo slice 29/56.0]
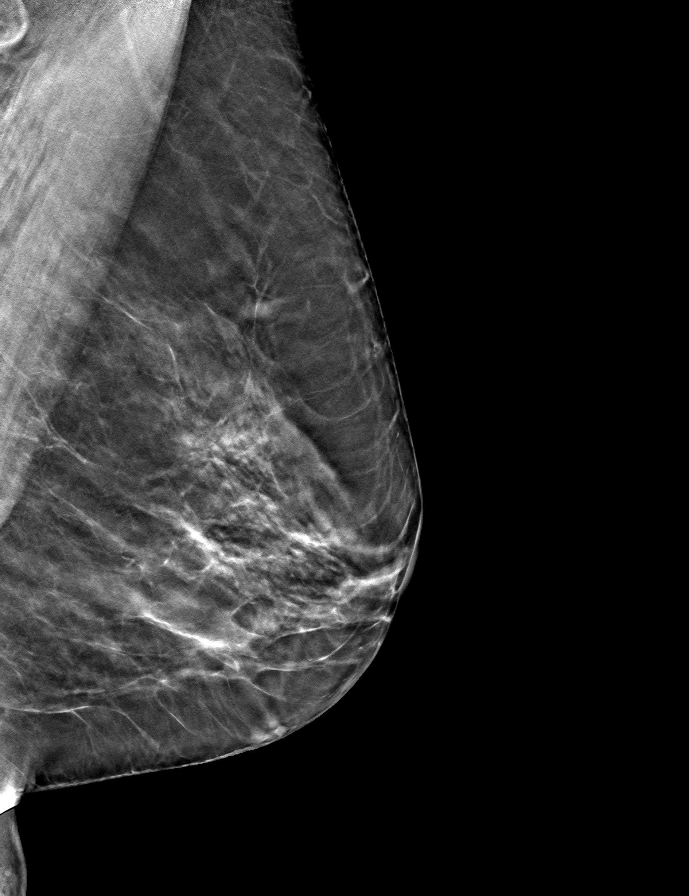

[9 of 24 positions shown; findings below may reference images not displayed]

ACR Breast Density Category c: The breast tissue is heterogeneously
dense, which may obscure small masses.
FINDINGS: There are no findings suspicious for malignancy. Images were
processed with CAD.
IMPRESSION: No mammographic evidence of malignancy. A result letter of this
screening mammogram will be mailed directly to the patient.

RECOMMENDATION:
Screening mammogram in one year. (Code:[id number])

BI-RADS CATEGORY  1: Negative.

## 2022-07-19 ENCOUNTER — Other Ambulatory Visit: Payer: Self-pay | Admitting: Unknown Physician Specialty

## 2022-07-19 ENCOUNTER — Other Ambulatory Visit: Payer: Self-pay | Admitting: Nurse Practitioner

## 2022-07-19 DIAGNOSIS — Z1231 Encounter for screening mammogram for malignant neoplasm of breast: Secondary | ICD-10-CM

## 2022-08-04 DIAGNOSIS — C801 Malignant (primary) neoplasm, unspecified: Secondary | ICD-10-CM

## 2022-08-04 HISTORY — DX: Malignant (primary) neoplasm, unspecified: C80.1

## 2022-08-10 ENCOUNTER — Ambulatory Visit
Admission: RE | Admit: 2022-08-10 | Discharge: 2022-08-10 | Disposition: A | Discharge status: Home / Self Care | Payer: BC Managed Care – PPO | Source: Ambulatory Visit | Attending: Unknown Physician Specialty | Admitting: Unknown Physician Specialty

## 2022-08-10 DIAGNOSIS — Z1231 Encounter for screening mammogram for malignant neoplasm of breast: Secondary | ICD-10-CM

## 2022-08-12 ENCOUNTER — Other Ambulatory Visit: Payer: Self-pay | Admitting: Unknown Physician Specialty

## 2022-08-12 DIAGNOSIS — R928 Other abnormal and inconclusive findings on diagnostic imaging of breast: Secondary | ICD-10-CM

## 2022-09-06 ENCOUNTER — Ambulatory Visit
Admission: RE | Admit: 2022-09-06 | Discharge: 2022-09-06 | Disposition: A | Discharge status: Home / Self Care | Payer: BC Managed Care – PPO | Source: Ambulatory Visit | Attending: Unknown Physician Specialty | Admitting: Unknown Physician Specialty

## 2022-09-06 ENCOUNTER — Encounter: Payer: Self-pay | Admitting: Unknown Physician Specialty

## 2022-09-06 ENCOUNTER — Other Ambulatory Visit: Payer: Self-pay | Admitting: Unknown Physician Specialty

## 2022-09-06 DIAGNOSIS — R928 Other abnormal and inconclusive findings on diagnostic imaging of breast: Secondary | ICD-10-CM

## 2022-09-06 DIAGNOSIS — N6310 Unspecified lump in the right breast, unspecified quadrant: Secondary | ICD-10-CM

## 2022-09-15 ENCOUNTER — Ambulatory Visit
Admission: RE | Admit: 2022-09-15 | Discharge: 2022-09-15 | Disposition: A | Discharge status: Home / Self Care | Payer: BC Managed Care – PPO | Source: Ambulatory Visit | Attending: Unknown Physician Specialty | Admitting: Unknown Physician Specialty

## 2022-09-15 DIAGNOSIS — N6310 Unspecified lump in the right breast, unspecified quadrant: Secondary | ICD-10-CM

## 2022-09-15 HISTORY — PX: BREAST BIOPSY: SHX20

## 2022-09-22 ENCOUNTER — Telehealth: Payer: Self-pay | Admitting: Hematology and Oncology

## 2022-09-22 ENCOUNTER — Telehealth: Payer: Self-pay | Admitting: Radiation Oncology

## 2022-09-22 ENCOUNTER — Other Ambulatory Visit: Payer: Self-pay | Admitting: General Surgery

## 2022-09-22 ENCOUNTER — Other Ambulatory Visit: Payer: Self-pay | Admitting: *Deleted

## 2022-09-22 DIAGNOSIS — Z170 Estrogen receptor positive status [ER+]: Secondary | ICD-10-CM

## 2022-09-22 NOTE — Telephone Encounter (Signed)
Contacted patient to scheduled appointments. Left message with appointment details and a call back number if patient had any questions or could not accommodate the time we provided.   

## 2022-09-22 NOTE — Telephone Encounter (Signed)
12/20 @ 11:34 am Left voicemail for patient to call our office to be schedule for consult.

## 2022-09-22 NOTE — Progress Notes (Signed)
Location of Breast Cancer:  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female, estrogen receptor positive   Histology per Pathology Report:    Receptor Status:    Did patient present with symptoms (if so, please note symptoms) or was this found on screening mammography?: routine mammogram  Past/Anticipated interventions by surgeon, if any: Scheduled to see Dr. Rolm Bookbinder on 10/05/2022  Past/Anticipated interventions by medical oncology, if any:  Under care of Dr. Nicholas Lose 09/25/2022 --Treatment plan: Breast conserving surgery with sentinel lymph node biopsy Adjuvant Taxotere and Cytoxan every 3 weeks x 4 Adjuvant radiation Adjuvant antiestrogen therapy --Patient husband is our family practice physician Dr. Consuello Masse. --We will repeat the breast prognostic panel on the final pathology.  Lymphedema issues, if any:  None   Pain issues, if any: Reports occasional discomfort to biopsy site  SAFETY ISSUES: Prior radiation? No Pacemaker/ICD? No Possible current pregnancy?  Is the patient on methotrexate? No  Current Complaints / other details:  Currently taking tamsulosin capsule daily to help manage kidney stone

## 2022-09-25 ENCOUNTER — Inpatient Hospital Stay: Payer: BC Managed Care – PPO | Attending: Hematology and Oncology | Admitting: Hematology and Oncology

## 2022-09-25 VITALS — BP 136/72 | HR 74 | Temp 97.2°F | Resp 20 | Ht 63.5 in | Wt 155.4 lb

## 2022-09-25 DIAGNOSIS — Z8042 Family history of malignant neoplasm of prostate: Secondary | ICD-10-CM | POA: Insufficient documentation

## 2022-09-25 DIAGNOSIS — Z8249 Family history of ischemic heart disease and other diseases of the circulatory system: Secondary | ICD-10-CM | POA: Insufficient documentation

## 2022-09-25 DIAGNOSIS — Z8349 Family history of other endocrine, nutritional and metabolic diseases: Secondary | ICD-10-CM | POA: Insufficient documentation

## 2022-09-25 DIAGNOSIS — Z880 Allergy status to penicillin: Secondary | ICD-10-CM | POA: Diagnosis not present

## 2022-09-25 DIAGNOSIS — Z170 Estrogen receptor positive status [ER+]: Secondary | ICD-10-CM | POA: Insufficient documentation

## 2022-09-25 DIAGNOSIS — C50411 Malignant neoplasm of upper-outer quadrant of right female breast: Secondary | ICD-10-CM | POA: Diagnosis present

## 2022-09-25 DIAGNOSIS — Z836 Family history of other diseases of the respiratory system: Secondary | ICD-10-CM | POA: Insufficient documentation

## 2022-09-25 DIAGNOSIS — Z832 Family history of diseases of the blood and blood-forming organs and certain disorders involving the immune mechanism: Secondary | ICD-10-CM | POA: Diagnosis not present

## 2022-09-25 DIAGNOSIS — Z79890 Hormone replacement therapy (postmenopausal): Secondary | ICD-10-CM | POA: Insufficient documentation

## 2022-09-25 DIAGNOSIS — Z79899 Other long term (current) drug therapy: Secondary | ICD-10-CM | POA: Diagnosis not present

## 2022-09-25 DIAGNOSIS — E039 Hypothyroidism, unspecified: Secondary | ICD-10-CM | POA: Diagnosis not present

## 2022-09-25 NOTE — Assessment & Plan Note (Addendum)
Screening mammogram detected right breast mass which measured 1.4 cm by ultrasound at 10 o'clock position, biopsy revealed grade 3 IDC with lymphovascular invasion ER 10%, PR 0%, Ki-67 30%, HER2 0 negative  Pathology and radiology counseling: Discussed with the patient, the details of pathology including the type of breast cancer,the clinical staging, the significance of ER, PR and HER-2/neu receptors and the implications for treatment. After reviewing the pathology in detail, we proceeded to discuss the different treatment options between surgery, radiation, chemotherapy, antiestrogen therapies.  Treatment plan: Breast conserving surgery with sentinel lymph node biopsy adjuvant Taxotere and Cytoxan every 3 weeks x 4 Adjuvant radiation Adjuvant antiestrogen therapy  Chemotherapy Counseling: I discussed the risks and benefits of chemotherapy including the risks of nausea/ vomiting, risk of infection from low WBC count, fatigue due to chemo or anemia, bruising or bleeding due to low platelets, mouth sores, loss/ change in taste and decreased appetite. Liver and kidney function will be monitored through out chemotherapy as abnormalities in liver and kidney function may be a side effect of treatment.  Risk of neuropathy and permanent bone marrow dysfunction and leukemia due to chemo were also discussed  We will repeat the breast prognostic panel on the final pathology.

## 2022-09-25 NOTE — Progress Notes (Signed)
[facility] CONSULT NOTE  Patient Care Team: Daniel, Terry G, MD as PCP - General (Unknown Physician Specialty)  CHIEF COMPLAINTS/PURPOSE OF CONSULTATION:  Newly diagnosed breast cancer  HISTORY OF PRESENTING ILLNESS:  Caitlin [name] 66 y.o. female is here because of recent diagnosis of right breast cancer.  Patient had a screening mammogram that detected abnormality in the right breast measuring 1.4 cm at 10 o'clock position.  Biopsy revealed grade 3 IDC with lymphovascular invasion that was ER 10% PR 0% Ki-67 30% and HER2 negative.  She is going to see Dr. Wakefield on October 05, 2022.  She came today to discuss adjuvant treatment plans.  I reviewed her records extensively and collaborated the history with the patient.  SUMMARY OF ONCOLOGIC HISTORY: Oncology History  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female, estrogen receptor positive (HCC)  09/15/2022 Initial Diagnosis   Screening mammogram detected right breast mass which measured 1.4 cm by ultrasound at 10 o'clock position, biopsy revealed grade 3 IDC with lymphovascular invasion ER 10%, PR 0%, Ki-67 30%, HER2 0 negative      MEDICAL HISTORY:  Past Medical History:  Diagnosis Date   Asthma    Thyroid disease     SURGICAL HISTORY: Past Surgical History:  Procedure Laterality Date   BREAST BIOPSY Right 09/15/2022   US RT BREAST BX W LOC DEV 1ST LESION IMG BX SPEC US GUIDE 09/15/2022 [facility]MO[facility]    SOCIAL HISTORY: Social History   Socioeconomic History   Marital status: Married    Spouse name: Not on file   Number of children: Not on file   Years of education: Not on file   Highest education level: Not on file  Occupational History   Not on file  Tobacco Use   Smoking status: Never   Smokeless tobacco: Not on file  Substance and Sexual Activity   Alcohol use: Not on file   Drug use: Not on file   Sexual activity: Not on file  Other Topics Concern   Not on  file  Social History Narrative   Not on file   Social Determinants of Health   Financial Resource Strain: Not on file  Food Insecurity: Not on file  Transportation Needs: Not on file  Physical Activity: Not on file  Stress: Not on file  Social Connections: Not on file  Intimate Partner Violence: Not on file    FAMILY HISTORY: Family History  Problem Relation Age of Onset   Thyroid disease Mother    Cancer Father        prostate ca died age 81   Hypertension Father    Heart attack Maternal Grandmother        died age 80's   Heart attack Maternal Grandfather        died age 80's   Clotting disorder Paternal Grandmother        cerebral hemorrage   Thyroid disease Sister        twin sister age 53   Asthma Sister        twin sister age53    ALLERGIES:  is allergic to penicillins.  MEDICATIONS:  Current Outpatient Medications  Medication Sig Dispense Refill   albuterol (VENTOLIN HFA) 108 (90 Base) MCG/ACT inhaler Inhale 2 puffs into the lungs every 4 (four) hours as needed.     aspirin 81 MG tablet Take 81 mg by mouth daily.       benzonatate (TESSALON) 200 MG capsule        conjugated estrogens (PREMARIN) vaginal cream Place vaginally.     diltiazem (CARDIZEM CD) 120 MG 24 hr capsule Take 120 mg by mouth daily.     fluticasone-salmeterol (ADVAIR HFA) 115-21 MCG/ACT inhaler Inhale 2 puffs into the lungs 2 (two) times daily.       levothyroxine (SYNTHROID) 75 MCG tablet Take 75 mcg by mouth daily before breakfast.     PULMICORT FLEXHALER 180 MCG/ACT inhaler SMARTSIG:2 Puff(s) By Mouth Twice Daily     No current facility-administered medications for this visit.    REVIEW OF SYSTEMS:   Constitutional: Denies fevers, chills or abnormal night sweats   All other systems were reviewed with the patient and are negative.  PHYSICAL EXAMINATION: ECOG PERFORMANCE STATUS: 1 - Symptomatic but completely ambulatory  Vitals:   09/25/22 1028  BP: 136/72  Pulse: 74  Resp: 20   Temp: (!) 97.2 F (36.2 C)   Filed Weights   09/25/22 1028  Weight: 155 lb 6.4 oz (70.5 kg)    GENERAL:alert, no distress and comfortable     RADIOGRAPHIC STUDIES: I have personally reviewed the radiological reports and agreed with the findings in the report.  ASSESSMENT AND PLAN:  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female, estrogen receptor positive (HCC) Screening mammogram detected right breast mass which measured 1.4 cm by ultrasound at 10 o'clock position, biopsy revealed grade 3 IDC with lymphovascular invasion ER 10%, PR 0%, Ki-67 30%, HER2 0 negative  Pathology and radiology counseling: Discussed with the patient, the details of pathology including the type of breast cancer,the clinical staging, the significance of ER, PR and HER-2/neu receptors and the implications for treatment. After reviewing the pathology in detail, we proceeded to discuss the different treatment options between surgery, radiation, chemotherapy, antiestrogen therapies.  Treatment plan: Breast conserving surgery with sentinel lymph node biopsy adjuvant Taxotere and Cytoxan every 3 weeks x 4 Adjuvant radiation Adjuvant antiestrogen therapy  Chemotherapy Counseling: I discussed the risks and benefits of chemotherapy including the risks of nausea/ vomiting, risk of infection from low WBC count, fatigue due to chemo or anemia, bruising or bleeding due to low platelets, mouth sores, loss/ change in taste and decreased appetite. Liver and kidney function will be monitored through out chemotherapy as abnormalities in liver and kidney function may be a side effect of treatment.  Risk of neuropathy and permanent bone marrow dysfunction and leukemia due to chemo were also discussed  Patient husband is our family practice physician Dr. [name] Navedo. We will repeat the breast prognostic panel on the final pathology.   All questions were answered. The patient knows to call the clinic with  any problems, questions or concerns.    Viinay K Gudena, MD 09/25/22  

## 2022-09-28 NOTE — Progress Notes (Signed)
**Note Caitlin-Identified via Obfuscation** Radiation Oncology         [phone] ________________________________  Initial Outpatient Consultation  Name: Caitlin [name] MRN: [id number]  Date: 09/29/2022  DOB: Jan 08, 1956  GU:RKYHCW, Mitzie Na, MD  Rolm Bookbinder, MD   REFERRING PHYSICIAN: Rolm Bookbinder, MD  DIAGNOSIS: The encounter diagnosis was 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female, estrogen receptor positive ([place]).  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 female who is accompanied by her husband Dr. Quintin Alto. she is seen as a courtesy of Dr. Donne Hazel for an opinion concerning radiation therapy as part of management for her recently diagnosed right breast cancer.   The patient presented for a routine screening mammogram on 08/10/22 which showed a possible mass in the right breast. Diagnostic right breast mammogram and right breast ultrasound on 09/06/22 further revealed a suspicious 1.4 cm mass in the 10 o'clock right breast, located 7 cmfn. No right axillary lymphadenopathy was appreciated.   Biopsy of the 10 o'clock right breast on 09/15/22 showed grade 3 invasive ductal carcinoma measuring 14 mm in the greatest linear extent of the sample, with focal high-grade DCIS, and a possible focus of LVI. Prognostic indicators significant for: estrogen receptor 10% weakly positive with weak staining intensity; progesterone receptor 0% negative; Proliferation marker Ki67 at 30%; Her2 status negative; Grade 3.   Accordingly, the patient was referred to Dr. Lindi Adie on 09/25/22 to discuss treatment options. Following discussion of the risks and benefits with Dr. Lindi Adie, the patient has agreed to proceed with breast conserving surgery with SLN biopsies, followed by adjuvant chemotherapy consisting of Taxotere and Cytoxan every 3 weeks x 4, XRT, and adjuvant  antiestrogen therapy.   She is scheduled to meet with Dr. Donne Hazel on 10/05/22.   PREVIOUS RADIATION THERAPY: No  PAST MEDICAL HISTORY:  Past Medical History:  Diagnosis Date   Asthma    Scoliosis    Thyroid disease     PAST SURGICAL HISTORY: Past Surgical History:  Procedure Laterality Date   BREAST BIOPSY Right 09/15/2022   [place] RT BREAST BX W LOC DEV 1ST LESION IMG BX SPEC US GUIDE 09/15/2022 [facility]    FAMILY HISTORY:  Family History  Problem Relation Age of Onset   Thyroid disease Mother    Cancer Father        prostate ca died age 55   Hypertension Father    Heart attack Maternal Grandmother        died age 67's   Heart attack Maternal Grandfather        died age 79's   Clotting disorder Paternal Grandmother        cerebral hemorrage   Thyroid disease Sister        twin sister age 50   Asthma Sister        twin sister age36    SOCIAL HISTORY:  Social History   Tobacco Use   Smoking status: Never  Vaping Use   Vaping Use: Never used  Substance Use Topics   Alcohol use: Not Currently   Drug use: Never    ALLERGIES:  Allergies  Allergen Reactions   Penicillins     REACTION: ?childhood reaction    MEDICATIONS:  Current Outpatient Medications  Medication Sig Dispense Refill   acetaminophen (TYLENOL) 500 MG tablet Take 500 mg by mouth every 6 (six) hours as  needed for moderate pain.     tamsulosin (FLOMAX) 0.4 MG CAPS capsule Take 0.4 mg by mouth daily.     albuterol (VENTOLIN HFA) 108 (90 Base) MCG/ACT inhaler Inhale 2 puffs into the lungs every 4 (four) hours as needed.     aspirin 81 MG tablet Take 81 mg by mouth daily.       benzonatate (TESSALON) 200 MG capsule      conjugated estrogens (PREMARIN) vaginal cream Place vaginally.     diltiazem (CARDIZEM CD) 120 MG 24 hr capsule Take 120 mg by mouth daily.     fluticasone-salmeterol (ADVAIR HFA) 115-21 MCG/ACT inhaler Inhale 2 puffs into the lungs 2 (two) times daily.        levothyroxine (SYNTHROID) 75 MCG tablet Take 75 mcg by mouth daily before breakfast.     PULMICORT FLEXHALER 180 MCG/ACT inhaler SMARTSIG:2 Puff(s) By Mouth Twice Daily     No current facility-administered medications for this encounter.    REVIEW OF SYSTEMS:  A 10+ POINT REVIEW OF SYSTEMS WAS OBTAINED including neurology, dermatology, psychiatry, cardiac, respiratory, lymph, extremities, GI, GU, musculoskeletal, constitutional, reproductive, HEENT.  Prior to biopsy she denied any pain within the breast area nipple discharge or bleeding.  She denies any previous biopsies of either breast.   PHYSICAL EXAM:  height is 5' 3.5" (1.613 m) and weight is 154 lb 9.6 oz (70.1 kg). Her temperature is 97.8 F (36.6 C). Her blood pressure is 127/66 and her pulse is 73. Her respiration is 20 and oxygen saturation is 100%.   General: Alert and oriented, in no acute distress HEENT: Head is normocephalic. Extraocular movements are intact.  Neck: Neck is supple, no palpable cervical or supraclavicular lymphadenopathy. Heart: Regular in rate and rhythm with no murmurs, rubs, or gallops. Chest: Clear to auscultation bilaterally, with no rhonchi, wheezes, or rales. Abdomen: Soft, nontender, nondistended, with no rigidity or guarding. Extremities: No cyanosis or edema. Lymphatics: see Neck Exam Skin: No concerning lesions. Musculoskeletal: symmetric strength and muscle tone throughout.  Scoliosis noted in the upper back Neurologic: Cranial nerves II through XII are grossly intact. No obvious focalities. Speech is fluent. Coordination is intact. Psychiatric: Judgment and insight are intact. Affect is appropriate. Left Breast: no palpable mass, nipple discharge or bleeding. Right Breast: Small biopsy site noted in the upper outer aspect of the right breast.  No dominant mass appreciated in the breast nipple discharge or bleeding.  ECOG = 0  0 - Asymptomatic (Fully active, able to carry on all predisease  activities without restriction)  1 - Symptomatic but completely ambulatory (Restricted in physically strenuous activity but ambulatory and able to carry out work of a light or sedentary nature. For example, light housework, office work)  2 - Symptomatic, <50% in bed during the day (Ambulatory and capable of all self care but unable to carry out any work activities. Up and about more than 50% of waking hours)  3 - Symptomatic, >50% in bed, but not bedbound (Capable of only limited self-care, confined to bed or chair 50% or more of waking hours)  4 - Bedbound (Completely disabled. Cannot carry on any self-care. Totally confined to bed or chair)  5 - Death   Eustace Pen MM, Creech RH, Tormey DC, et al. [phone]). "Toxicity and response criteria of the [facility]". [place] Oncol. 5 (6): 649-55  LABORATORY DATA:  No results found for: "WBC", "HGB", "HCT", "MCV", "PLT", "NEUTROABS" No results found for: "NA", "K", "CL", "CO2", "GLUCOSE", "  BUN", "CREATININE", "CALCIUM"    RADIOGRAPHY: [place] RT BREAST BX W LOC DEV 1ST LESION IMG BX SPEC US GUIDE  Addendum Date: 09/20/2022   ADDENDUM REPORT: 09/20/2022 08:59 ADDENDUM: Pathology revealed GRADE 3 INVASIVE DUCTAL CARCINOMA, FOCAL DUCTAL CARCINOMA IN SITU, SOLID TYPE, NUCLEAR GRADE 3 OF 3, LYMPHOVASCULAR INVASION: RARE FOCUS SUSPICIOUS FOR LYMPHOVASCULAR INVASION of the RIGHT breast, 10:00 o'clock (ribbon clip). This was found to be concordant by Dr. Franki Cabot. Pathology results were discussed with the patient by telephone. The patient reported doing well after the biopsy with tenderness at the site. Post biopsy instructions and care were reviewed and questions were answered. The patient was encouraged to call [facility] for any additional concerns. Per patient's request and her schedule, surgical consultation has been arranged with Dr. Rolm Bookbinder at [facility] on October 19, 2022.  Pathology results reported by Stacie Acres RN on 09/20/2022. Electronically Signed   By: Franki Cabot M.D.   On: 09/20/2022 08:59   Result Date: 09/20/2022 CLINICAL DATA:  Patient presents today for ultrasound-guided biopsy of a RIGHT breast mass at the 10 o'clock axis. EXAM: ULTRASOUND GUIDED RIGHT BREAST CORE NEEDLE BIOPSY COMPARISON:  Previous exam(s). PROCEDURE: I met with the patient and we discussed the procedure of ultrasound-guided biopsy, including benefits and alternatives. We discussed the high likelihood of a successful procedure. We discussed the risks of the procedure, including infection, bleeding, tissue injury, clip migration, and inadequate sampling. Informed written consent was given. The usual time-out protocol was performed immediately prior to the procedure. Lesion quadrant: Upper outer quadrant Using sterile technique and 1% Lidocaine as local anesthetic, under direct ultrasound visualization, a 12 gauge spring-loaded device was used to perform biopsy of the RIGHT breast mass at the 10 o'clock axis, 7 cm from the nipple, using a inferolateral approach. At the conclusion of the procedure , a ribbon shaped tissue marker clip was deployed into the biopsy cavity. Follow up 2 view mammogram was performed and dictated separately. IMPRESSION: Ultrasound guided biopsy of the RIGHT breast mass at the 10 o'clock axis. No apparent complications. Electronically Signed: By: Franki Cabot M.D. On: 09/15/2022 13:16  MM CLIP PLACEMENT RIGHT  Result Date: 09/15/2022 CLINICAL DATA:  Status post ultrasound-guided biopsy for a RIGHT breast mass at the 10 o'clock axis. EXAM: 3D DIAGNOSTIC RIGHT MAMMOGRAM POST ULTRASOUND BIOPSY COMPARISON:  Previous exam(s). FINDINGS: 3D Mammographic images were obtained following ultrasound guided biopsy of the RIGHT breast mass at the 10 o'clock axis. The biopsy marking clip is in expected position at the site of biopsy. IMPRESSION: Appropriate positioning of the ribbon  shaped biopsy marking clip at the site of biopsy in the upper-outer quadrant of the RIGHT breast corresponding to the targeted mass at the 10 o'clock axis. Final Assessment: Post Procedure Mammograms for Marker Placement Electronically Signed   By: Franki Cabot M.D.   On: 09/15/2022 13:36  MM DIAG BREAST TOMO UNI RIGHT  Result Date: 09/06/2022 CLINICAL DATA:  Patient was recalled from screening mammogram for a right breast mass. EXAM: DIGITAL DIAGNOSTIC UNILATERAL RIGHT MAMMOGRAM WITH TOMOSYNTHESIS; ULTRASOUND RIGHT BREAST LIMITED TECHNIQUE: Right digital diagnostic mammography and breast tomosynthesis was performed.; Targeted ultrasound examination of the right breast was performed COMPARISON:  Previous exam(s). ACR Breast Density Category c: The breast tissue is heterogeneously dense, which may obscure small masses. FINDINGS: Additional imaging of the right breast was performed. There is a spiculated mass in the upper-outer quadrant of the breast. There are no malignant  type microcalcifications. Benign vascular calcifications are seen in the upper-outer quadrant of the breast. Targeted ultrasound is performed, showing a spiculated hypoechoic mass in the right breast at 10 o'clock 7 cm from the nipple measuring 1.4 x 0.9 x 1.2 cm. Sonographic evaluation the right axilla does not show any enlarged adenopathy. IMPRESSION: Suspicious 1.4 cm mass in the 10 o'clock region of the right breast 7 cm from the nipple. RECOMMENDATION: Ultrasound-guided core biopsy of the mass in the upper-outer quadrant of the right breast is recommended. The biopsy will be scheduled at the patient's convenience. I have discussed the findings and recommendations with the patient. If applicable, a reminder letter will be sent to the patient regarding the next appointment. BI-RADS CATEGORY  5: Highly suggestive of malignancy. Electronically Signed   By: Lillia Mountain M.D.   On: 09/06/2022 12:23  US BREAST LTD UNI RIGHT INC AXILLA  Result  Date: 09/06/2022 CLINICAL DATA:  Patient was recalled from screening mammogram for a right breast mass. EXAM: DIGITAL DIAGNOSTIC UNILATERAL RIGHT MAMMOGRAM WITH TOMOSYNTHESIS; ULTRASOUND RIGHT BREAST LIMITED TECHNIQUE: Right digital diagnostic mammography and breast tomosynthesis was performed.; Targeted ultrasound examination of the right breast was performed COMPARISON:  Previous exam(s). ACR Breast Density Category c: The breast tissue is heterogeneously dense, which may obscure small masses. FINDINGS: Additional imaging of the right breast was performed. There is a spiculated mass in the upper-outer quadrant of the breast. There are no malignant type microcalcifications. Benign vascular calcifications are seen in the upper-outer quadrant of the breast. Targeted ultrasound is performed, showing a spiculated hypoechoic mass in the right breast at 10 o'clock 7 cm from the nipple measuring 1.4 x 0.9 x 1.2 cm. Sonographic evaluation the right axilla does not show any enlarged adenopathy. IMPRESSION: Suspicious 1.4 cm mass in the 10 o'clock region of the right breast 7 cm from the nipple. RECOMMENDATION: Ultrasound-guided core biopsy of the mass in the upper-outer quadrant of the right breast is recommended. The biopsy will be scheduled at the patient's convenience. I have discussed the findings and recommendations with the patient. If applicable, a reminder letter will be sent to the patient regarding the next appointment. BI-RADS CATEGORY  5: Highly suggestive of malignancy. Electronically Signed   By: Lillia Mountain M.D.   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  She is scheduled to see Dr. Donne Hazel in early January for further evaluation of surgical options.  She would also  benefit from radiation therapy to the breast as breast conserving therapy and to reduce the risk for recurrence within the breast.  Today, I talked to the patient and her husband about the findings and work-up thus far.  We discussed the natural history of invasive ductal breast cancer and general treatment, highlighting the role of radiotherapy in the management.  We discussed the available radiation techniques, and focused on the details of logistics and delivery.  We reviewed the anticipated acute and late sequelae associated with radiation in this setting.  The patient was encouraged to ask questions that I answered to the best of my ability.   PLAN: She will meet with with genetics later today.  Surgical evaluation in early January.  She has already met with Dr. Lindi Adie who has recommended adjuvant chemotherapy.  I discussed with the patient and her husband that radiation therapy would follow her adjuvant chemotherapy.  I also discussed potential radiation treatments in [place] where they live and she will make a decision concerning treatment facility at a later date after she has met with Dr. Donne Hazel.   45 minutes of total time was spent for this patient encounter, including preparation, face-to-face counseling with the patient and coordination of care, physical exam, and documentation of the encounter.   ------------------------------------------------  Blair Promise, PhD, MD  This document serves as a record of services personally performed by Gery Pray, MD. It was created on his behalf by Roney Mans, a trained medical scribe. The creation of this record is based on the scribe's personal observations and the provider's statements to them. This document has been checked and approved by the attending provider.

## 2022-09-29 ENCOUNTER — Encounter: Payer: Self-pay | Admitting: Genetic Counselor

## 2022-09-29 ENCOUNTER — Encounter: Payer: Self-pay | Admitting: Radiation Oncology

## 2022-09-29 ENCOUNTER — Inpatient Hospital Stay: Payer: BC Managed Care – PPO

## 2022-09-29 ENCOUNTER — Ambulatory Visit
Admission: RE | Admit: 2022-09-29 | Discharge: 2022-09-29 | Disposition: A | Discharge status: Home / Self Care | Payer: BC Managed Care – PPO | Source: Ambulatory Visit | Attending: Radiation Oncology | Admitting: Radiation Oncology

## 2022-09-29 ENCOUNTER — Other Ambulatory Visit: Payer: Self-pay | Admitting: Genetic Counselor

## 2022-09-29 ENCOUNTER — Inpatient Hospital Stay (HOSPITAL_BASED_OUTPATIENT_CLINIC_OR_DEPARTMENT_OTHER): Payer: BC Managed Care – PPO | Admitting: Genetic Counselor

## 2022-09-29 VITALS — BP 127/66 | HR 73 | Temp 97.8°F | Resp 20 | Ht 63.5 in | Wt 154.6 lb

## 2022-09-29 DIAGNOSIS — Z170 Estrogen receptor positive status [ER+]: Secondary | ICD-10-CM | POA: Diagnosis not present

## 2022-09-29 DIAGNOSIS — Z8042 Family history of malignant neoplasm of prostate: Secondary | ICD-10-CM | POA: Diagnosis not present

## 2022-09-29 DIAGNOSIS — Z79899 Other long term (current) drug therapy: Secondary | ICD-10-CM | POA: Insufficient documentation

## 2022-09-29 DIAGNOSIS — J45909 Unspecified asthma, uncomplicated: Secondary | ICD-10-CM | POA: Insufficient documentation

## 2022-09-29 DIAGNOSIS — Z7951 Long term (current) use of inhaled steroids: Secondary | ICD-10-CM | POA: Insufficient documentation

## 2022-09-29 DIAGNOSIS — Z7982 Long term (current) use of aspirin: Secondary | ICD-10-CM | POA: Insufficient documentation

## 2022-09-29 DIAGNOSIS — Z808 Family history of malignant neoplasm of other organs or systems: Secondary | ICD-10-CM | POA: Diagnosis not present

## 2022-09-29 DIAGNOSIS — C50411 Malignant neoplasm of upper-outer quadrant of right female breast: Secondary | ICD-10-CM | POA: Insufficient documentation

## 2022-09-29 DIAGNOSIS — Z1379 Encounter for other screening for genetic and chromosomal anomalies: Secondary | ICD-10-CM

## 2022-09-29 DIAGNOSIS — E079 Disorder of thyroid, unspecified: Secondary | ICD-10-CM | POA: Diagnosis not present

## 2022-09-29 DIAGNOSIS — K3533 Acute appendicitis with perforation and localized peritonitis, with abscess: Secondary | ICD-10-CM | POA: Insufficient documentation

## 2022-09-29 HISTORY — DX: Scoliosis, unspecified: M41.9

## 2022-09-29 LAB — GENETIC SCREENING ORDER

## 2022-09-29 NOTE — Progress Notes (Signed)
REFERRING PROVIDER: Rolm Bookbinder, MD  PRIMARY PROVIDER:  Caryl Bis, MD  PRIMARY REASON FOR VISIT:  1. 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female, estrogen receptor positive ([place])   2. Family history of prostate cancer     HISTORY OF PRESENT ILLNESS:   Ms. Youkhana, a 66 y.o. female, was seen for a [place] cancer genetics consultation at the request of Dr. Donne Hazel due to a personal and family history of cancer.  Ms. Lobb presents to clinic today to discuss the possibility of a hereditary predisposition to cancer, to discuss genetic testing, and to further clarify her future cancer risks, as well as potential cancer risks for family members.   In December 2023, at the age of 45, Ms. Rogue was diagnosed with invasive ductal carcinoma of the right breast (ER 10% low positive, weak staining, PR negative, HER2 negative).  CANCER HISTORY:  Oncology History  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female, estrogen receptor positive ([place])  09/15/2022 Initial Diagnosis   Screening mammogram detected right breast mass which measured 1.4 cm by ultrasound at 10 o'clock position, biopsy revealed grade 3 IDC with lymphovascular invasion ER 10%, PR 0%, Ki-67 30%, HER2 0 negative   09/25/2022 Cancer Staging   Staging form: Breast, AJCC 8th Edition - Clinical: Stage IB (cT1c, cN0, cM0, G3, ER+, PR-, HER2-) - Signed by Nicholas Lose, MD on 09/25/2022 Histologic grading system: 3 grade system      RISK FACTORS:  First live birth at age 80.  OCP use for approximately  15-20  years.  Ovaries intact: yes.  Uterus intact: yes.  Menopausal status: postmenopausal.  HRT use: 0 years. Colonoscopy: yes; normal. Mammogram within the last year: yes. Any excessive radiation exposure in the past: no  Past Medical History:  Diagnosis Date   Asthma    Scoliosis    Thyroid disease     Past Surgical History:  Procedure Laterality Date    BREAST BIOPSY Right 09/15/2022   [place] RT BREAST BX W LOC DEV 1ST LESION IMG BX SPEC US GUIDE 09/15/2022 [facility]    Social History   Socioeconomic History   Marital status: Married    Spouse name: Not on file   Number of children: Not on file   Years of education: Not on file   Highest education level: Not on file  Occupational History   Not on file  Tobacco Use   Smoking status: Never   Smokeless tobacco: Not on file  Vaping Use   Vaping Use: Never used  Substance and Sexual Activity   Alcohol use: Not Currently   Drug use: Never   Sexual activity: Not Currently  Other Topics Concern   Not on file  Social History Narrative   Not on file   Social Determinants of Health   Financial Resource Strain: Not on file  Food Insecurity: Not on file  Transportation Needs: Not on file  Physical Activity: Not on file  Stress: Not on file  Social Connections: Not on file     FAMILY HISTORY:  We obtained a detailed, 4-generation family history.  Significant diagnoses are listed below: Family History  Problem Relation Age of Onset   Thyroid disease Mother    Prostate cancer Father 85       metastatic   Hypertension Father    Thyroid disease Sister        twin sister age 70   Asthma Sister    Prostate  cancer Brother 42   Heart attack Maternal Grandmother        died age 35's   Heart attack Maternal Grandfather        died age 39's   Clotting disorder Paternal Grandmother        cerebral hemorrage       Ms. Renteria's brother was diagnosed with prostate cancer at age 25, he is currently 9. Her maternal great grandmother (grandmother's mother) possibly had breast cancer, she is deceased. Ms. Grabinski father was diagnosed with prostate cancer at age 43 and died due to metastatic prostate cancer at age 57. Ms. Montag is unaware of previous family history of genetic testing for hereditary cancer risks. There is no reported Ashkenazi Jewish ancestry.  GENETIC COUNSELING  ASSESSMENT: Ms. Luhmann is a 66 y.o. female with a personal and family history of cancer which is somewhat suggestive of a hereditary predisposition to cancer. We, therefore, discussed and recommended the following at today's visit.   DISCUSSION: We discussed that 5 - 10% of cancer is hereditary, with most cases of breast cancer associated with BRCA1/2.  There are other genes that can be associated with hereditary breast cancer syndromes.  We discussed that testing is beneficial for several reasons including knowing how to follow individuals after completing their treatment, identifying whether potential treatment options would be beneficial, and understanding if other family members could be at risk for cancer and allowing them to undergo genetic testing.   We reviewed the characteristics, features and inheritance patterns of hereditary cancer syndromes. We also discussed genetic testing, including the appropriate family members to test, the process of testing, insurance coverage and turn-around-time for results. We discussed the implications of a negative, positive, carrier and/or variant of uncertain significant result. We recommended Ms. Speece pursue genetic testing for a panel that includes genes associated with breast and prostate cancer.   Ms. Charley  was offered a common hereditary cancer panel (47 genes) and an expanded pan-cancer panel (77 genes). Ms. Leonhardt was informed of the benefits and limitations of each panel, including that expanded pan-cancer panels contain genes that do not have clear management guidelines at this point in time.  We also discussed that as the number of genes included on a panel increases, the chances of variants of uncertain significance increases. After considering the benefits and limitations of each gene panel, Ms. Jayne elected to have [facility] CancerNext-Expanded Panel.  The CancerNext-Expanded gene panel offered by [facility] and includes sequencing,  rearrangement, and RNA analysis for the following 77 genes: AIP, ALK, APC, ATM, AXIN2, BAP1, BARD1, BLM, BMPR1A, BRCA1, BRCA2, BRIP1, CDC73, CDH1, CDK4, CDKN1B, CDKN2A, CHEK2, CTNNA1, DICER1, FANCC, FH, FLCN, GALNT12, KIF1B, LZTR1, MAX, MEN1, MET, MLH1, MSH2, MSH3, MSH6, MUTYH, NBN, NF1, NF2, NTHL1, PALB2, PHOX2B, PMS2, POT1, PRKAR1A, PTCH1, PTEN, RAD51C, RAD51D, RB1, RECQL, RET, SDHA, SDHAF2, SDHB, SDHC, SDHD, SMAD4, SMARCA4, SMARCB1, SMARCE1, STK11, SUFU, TMEM127, TP53, TSC1, TSC2, VHL and XRCC2 (sequencing and deletion/duplication); EGFR, EGLN1, HOXB13, KIT, MITF, PDGFRA, POLD1, and POLE (sequencing only); EPCAM and GREM1 (deletion/duplication only).   Based on Ms. Cullifer's personal and family history of cancer, she meets medical criteria for genetic testing. Despite that she meets criteria, she may still have an out of pocket cost. We discussed that if her out of pocket cost for testing is over $100, the laboratory will call and confirm whether she wants to proceed with testing.  If the out of pocket cost of testing is less than $100 she will be billed by  the genetic testing laboratory.   PLAN: After considering the risks, benefits, and limitations, Ms. Kobler provided informed consent to pursue genetic testing and the blood sample was sent to [facility] for analysis of the CancerNext-Expanded Panel. Results should be available within approximately 2-3 weeks' time, at which point they will be disclosed by telephone to Ms. Krehbiel, as will any additional recommendations warranted by these results. Ms. Poulton will receive a summary of her genetic counseling visit and a copy of her results once available. This information will also be available in Epic.   Ms. Colonna questions were answered to her satisfaction today. Our contact information was provided should additional questions or concerns arise. Thank you for the referral and allowing [place] to share in the care of your patient.   Lucille Passy,  MS, [facility] Genetic Counselor [email or web address] (P) [phone]  The patient was seen for a total of 40 minutes in face-to-face genetic counseling. The patient brought her husband. Drs. Lindi Adie and/or Burr Medico were available to discuss this case as needed.   _______________________________________________________________________ For Office Staff:  Number of people involved in session: 2 Was an Intern/ student involved with case: no

## 2022-09-30 ENCOUNTER — Telehealth: Payer: Self-pay | Admitting: *Deleted

## 2022-09-30 ENCOUNTER — Encounter: Payer: Self-pay | Admitting: *Deleted

## 2022-09-30 NOTE — Telephone Encounter (Signed)
Left vm with contact information regarding questions or needs.

## 2022-10-05 ENCOUNTER — Other Ambulatory Visit: Payer: Self-pay | Admitting: General Surgery

## 2022-10-05 DIAGNOSIS — Z170 Estrogen receptor positive status [ER+]: Secondary | ICD-10-CM

## 2022-10-06 ENCOUNTER — Encounter: Payer: Self-pay | Admitting: *Deleted

## 2022-10-06 DIAGNOSIS — Z170 Estrogen receptor positive status [ER+]: Secondary | ICD-10-CM

## 2022-10-07 ENCOUNTER — Encounter: Payer: Self-pay | Admitting: *Deleted

## 2022-10-11 ENCOUNTER — Telehealth: Payer: Self-pay | Admitting: Genetic Counselor

## 2022-10-11 ENCOUNTER — Encounter: Payer: Self-pay | Admitting: Genetic Counselor

## 2022-10-11 ENCOUNTER — Encounter: Payer: Self-pay | Admitting: Hematology and Oncology

## 2022-10-11 DIAGNOSIS — Z1379 Encounter for other screening for genetic and chromosomal anomalies: Secondary | ICD-10-CM | POA: Insufficient documentation

## 2022-10-11 NOTE — Therapy (Signed)
OUTPATIENT PHYSICAL THERAPY BREAST CANCER BASELINE EVALUATION   Patient Name: Caitlin [name] MRN: [id number] DOB:[birth year], 67 y.o., female Today's Date: 10/12/2022  END OF SESSION:  PT End of Session - 10/12/22 1049     Visit Number 1    Number of Visits 2    Date for PT Re-Evaluation 11/09/22    PT Start Time 1004    PT Stop Time 1287    PT Time Calculation (min) 40 min    Activity Tolerance Patient tolerated treatment well    Behavior During Therapy [facility] for tasks assessed/performed             Past Medical History:  Diagnosis Date   Asthma    Scoliosis    Thyroid disease    Past Surgical History:  Procedure Laterality Date   BREAST BIOPSY Right 09/15/2022   [place] RT BREAST BX W LOC DEV 1ST LESION IMG BX SPEC US GUIDE 09/15/2022 [facility] MAMMOGRAPHY   Patient Active Problem List   Diagnosis Date Noted   Genetic testing 10/11/2022   Appendicitis with peritonitis 09/29/2022   Family history of prostate cancer 09/29/2022   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female, estrogen receptor positive ([name]) 09/22/2022   Atrial fibrillation with RVR ([place]) 11/30/2010   Chronic anticoagulation 11/30/2010   Hypothyroidism 11/30/2010   PALPITATIONS 11/27/2010    PCP: Caryl Bis, MD  REFERRING PROVIDER: Rolm Bookbinder, MD  REFERRING DIAG: [phone] (ICD-10-CM) - 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female, estrogen receptor positive ([place])  THERAPY DIAG:  Abnormal posture  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female, estrogen receptor positive ([place])  Rationale for Evaluation and Treatment: Rehabilitation  ONSET DATE: 09/15/22  SUBJECTIVE:                                                                                                                                                                                           SUBJECTIVE STATEMENT: Patient reports she is here today to be  seen by her medical team for her newly diagnosed right breast cancer.   PERTINENT HISTORY:  Patient was diagnosed on 09/15/22 with right grade 3. It measures 1.4 x 0.9 x 1.2 cm and is located in the upper-outer quadrant. It is ER+, PR-, HER2- with a Ki67 of 30%. Plan is to undergo a R lumpectomy and SLNB on 10/14/22 followed by chemo and then radiation.   PATIENT GOALS:   reduce lymphedema risk and learn post op HEP.   PAIN:  Are you having pain? No  PRECAUTIONS: Active CA None  HAND DOMINANCE: right  WEIGHT BEARING  RESTRICTIONS: No  FALLS:  Has patient fallen in last 6 months? 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   LEISURE: walks 2x a day every day to walk animals  PRIOR LEVEL OF FUNCTION: Independent   OBJECTIVE:  COGNITION: Overall cognitive status: Within functional limits for tasks assessed    POSTURE:  Forward head and rounded shoulders posture  UPPER EXTREMITY AROM/PROM:  A/PROM RIGHT   eval   Shoulder extension 81  Shoulder flexion 157  Shoulder abduction 165  Shoulder internal rotation 64  Shoulder external rotation 62    (Blank rows = not tested)  A/PROM LEFT   eval  Shoulder extension 64  Shoulder flexion 167  Shoulder abduction 160  Shoulder internal rotation 54  Shoulder external rotation 81    (Blank rows = not tested)  CERVICAL AROM: All within normal limits:    Percent limited  Flexion WFL  Extension 25%  Right lateral flexion WFL  Left lateral flexion WFL  Right rotation WFL  Left rotation WFL    UPPER EXTREMITY STRENGTH: 5/5  LYMPHEDEMA ASSESSMENTS:   LANDMARK RIGHT   eval  10 cm proximal to olecranon process 27.3  Olecranon process 23.3  10 cm proximal to ulnar styloid process 20.8   Just proximal to ulnar styloid process 15.5  Across hand at thumb web space 20.2  At base of 2nd digit 6.4  (Blank rows = not tested)  LANDMARK LEFT   eval  10 cm proximal to olecranon process 28  Olecranon process 23.8  10 cm proximal to ulnar styloid process 20  Just proximal to ulnar styloid process 15  Across hand at thumb web space 18.5  At base of 2nd digit 6.1  (Blank rows = not tested)  L-DEX LYMPHEDEMA SCREENING:  The patient was assessed using the L-Dex machine today to produce a lymphedema index baseline score. The patient will be reassessed on a regular basis (typically every 3 months) to obtain new L-Dex scores. If the score is > 6.5 points away from his/her baseline score indicating onset of subclinical lymphedema, it will be recommended to wear a compression garment for 4 weeks, 12 hours per day and then be reassessed. If the score continues to be > 6.5 points from baseline at reassessment, we will initiate lymphedema treatment. Assessing in this manner has a 95% rate of preventing clinically significant lymphedema.   L-DEX FLOWSHEETS - 10/12/22 1000       L-DEX LYMPHEDEMA SCREENING   Measurement Type Unilateral    L-DEX MEASUREMENT EXTREMITY Upper Extremity    POSITION  Standing    DOMINANT SIDE Right    At Risk Side Right    BASELINE SCORE (UNILATERAL) -2.9             QUICK DASH SURVEY:  Katina Dung - 10/12/22 0001     Open a tight or new jar Mild difficulty    Do heavy household chores (wash walls, wash floors) No difficulty    Carry a shopping bag or briefcase No difficulty    Wash your back No difficulty    Use a knife to cut food No difficulty    Recreational activities in which you take some force or impact through your arm, shoulder, or hand (golf, hammering, tennis) No difficulty  During the past week, to what extent has your arm, shoulder or hand problem interfered with your normal social activities with family, friends, neighbors, or groups?  Not at all    During the past week, to what extent has your arm, shoulder or hand problem limited your work or other regular daily activities Not at all    Arm, shoulder, or hand pain. None    Tingling (pins and needles) in your arm, shoulder, or hand None    Difficulty Sleeping No difficulty    DASH Score 2.27 %              PATIENT EDUCATION:  Education details: Lymphedema risk reduction and post op shoulder/posture HEP Person educated: Patient Education method: Explanation, Demonstration, Handout Education comprehension: Patient verbalized understanding and returned demonstration  HOME EXERCISE PROGRAM: Patient was instructed today in a home exercise program today for post op shoulder range of motion. These included active assist shoulder flexion in sitting, scapular retraction, wall walking with shoulder abduction, and hands behind head external rotation.  She was encouraged to do these twice a day, holding 3 seconds and repeating 5 times when permitted by her physician.   ASSESSMENT:  CLINICAL IMPRESSION: Pt reports to PT with recently diagnosed R breast cancer. She will undergo a R lumpectomy and SLNB on 10/14/22. She plans to have chemo and radiation following. Her shoulder ROM is WFL at baseline. Pt was educated about risk of lymphedema and post op exercises. She will benefit from a post op PT reassessment to determine needs and from L-Dex screens every 3 months for 2 years to detect subclinical lymphedema.  Pt will benefit from skilled therapeutic intervention to improve on the following deficits: Decreased knowledge of precautions, impaired UE functional use, pain, decreased ROM, postural dysfunction.   PT treatment/interventions: ADL/self-care home management, pt/family education, therapeutic exercise  REHAB POTENTIAL: Good  CLINICAL DECISION MAKING: Stable/uncomplicated  EVALUATION COMPLEXITY: Low   GOALS: Goals reviewed with patient? YES  LONG TERM GOALS:  (STG=LTG)    Name Target Date Goal status  1 Pt will be able to verbalize understanding of pertinent lymphedema risk reduction practices relevant to her dx specifically related to skin care.  Baseline:  No knowledge 10/12/2022 Achieved at eval  2 Pt will be able to return demo and/or verbalize understanding of the post op HEP related to regaining shoulder ROM. Baseline:  No knowledge 10/12/2022 Achieved at eval  3 Pt will be able to verbalize understanding of the importance of attending the post op After Breast CA Class for further lymphedema risk reduction education and therapeutic exercise.  Baseline:  No knowledge 10/12/2022 Achieved at eval  4 Pt will demo she has regained full shoulder ROM and function post operatively compared to baselines.  Baseline: See objective measurements taken today. 11/09/22 NEW    PLAN:  PT FREQUENCY/DURATION: EVAL and 1 follow up appointment.   PLAN FOR NEXT SESSION: will reassess 3-4 weeks post op to determine needs.   Patient will follow up at outpatient cancer rehab 3-4 weeks following surgery.  If the patient requires physical therapy at that time, a specific plan will be dictated and sent to the referring physician for approval. The patient was educated today on appropriate basic range of motion exercises to begin post operatively and the importance of attending the After Breast Cancer class following surgery.  Patient was educated today on lymphedema risk reduction practices as it pertains to recommendations that will benefit the patient immediately following surgery.  She  verbalized good understanding.    Physical Therapy Information for After Breast Cancer Surgery/Treatment:  Lymphedema is a swelling condition that you may be at risk for in your arm if you have lymph nodes removed from the armpit area.  After a sentinel node biopsy, the risk is approximately 5-9% and is higher after an axillary node dissection.  There is treatment available for this condition  and it is not life-threatening.  Contact your physician or physical therapist with concerns. You may begin the 4 shoulder/posture exercises (see additional sheet) when permitted by your physician (typically a week after surgery).  If you have drains, you may need to wait until those are removed before beginning range of motion exercises.  A general recommendation is to not lift your arms above shoulder height until drains are removed.  These exercises should be done to your tolerance and gently.  This is not a "no pain/no gain" type of recovery so listen to your body and stretch into the range of motion that you can tolerate, stopping if you have pain.  If you are having immediate reconstruction, ask your plastic surgeon about doing exercises as he or she may want you to wait. We encourage you to attend the free one time ABC (After Breast Cancer) class offered by [facility].  You will learn information related to lymphedema risk, prevention and treatment and additional exercises to regain mobility following surgery.  You can call [phone] for more information.  This is offered the 1st and 3rd Monday of each month.  You only attend the class one time. While undergoing any medical procedure or treatment, try to avoid blood pressure being taken or needle sticks from occurring on the arm on the side of cancer.   This recommendation begins after surgery and continues for the rest of your life.  This may help reduce your risk of getting lymphedema (swelling in your arm). An excellent resource for those seeking information on lymphedema is the National Lymphedema Network's web site. It can be accessed at [place].org If you notice swelling in your hand, arm or breast at any time following surgery (even if it is many years from now), please contact your doctor or physical therapist to discuss this.  Lymphedema can be treated at any time but it is easier for you if it is treated early  on.  If you feel like your shoulder motion is not returning to normal in a reasonable amount of time, please contact your surgeon or physical therapist.  [facility] [id number]. [address], [place] [address]  ABC CLASS After Breast Cancer Class  After Breast Cancer Class is a specially designed exercise class to assist you in a safe recover after having breast cancer surgery.  In this class you will learn how to get back to full function whether your drains were just removed or if you had surgery a month ago.  This one-time class is held the 1st and 3rd Monday of every month from 11:00 a.m. until 12:00 noon virtually.  This class is FREE and space is limited. For more information or to register for the next available class, call [phone].  Class Goals  Understand specific stretches to improve the flexibility of you chest and shoulder. Learn ways to safely strengthen your upper body and improve your posture. Understand the warning signs of infection and why you may be at risk for an arm infection. Learn about Lymphedema and prevention.  **  You do not attend this class until after surgery.  Drains must be removed to participate  Patient was instructed today in a home exercise program today for post op shoulder range of motion. These included active assist shoulder flexion in sitting, scapular retraction, wall walking with shoulder abduction, and hands behind head external rotation.  She was encouraged to do these twice a day, holding 3 seconds and repeating 5 times when permitted by her physician.    [employer], PT 10/12/2022, 10:51 AM

## 2022-10-11 NOTE — Telephone Encounter (Signed)
I contacted Ms. Cortopassi to discuss her genetic testing results. No pathogenic variants were identified in the 77 genes analyzed. Detailed clinic note to follow.  The test report has been scanned into EPIC and is located under the Molecular Pathology section of the Results Review tab.  A portion of the result report is included below for reference.   Lucille Passy, MS, [facility] [email or web address] (P) [phone]

## 2022-10-12 ENCOUNTER — Ambulatory Visit
Admission: RE | Admit: 2022-10-12 | Discharge: 2022-10-12 | Disposition: A | Discharge status: Home / Self Care | Payer: BC Managed Care – PPO | Source: Ambulatory Visit | Attending: General Surgery | Admitting: General Surgery

## 2022-10-12 ENCOUNTER — Encounter: Payer: Self-pay | Admitting: Physical Therapy

## 2022-10-12 ENCOUNTER — Other Ambulatory Visit: Payer: Self-pay | Admitting: General Surgery

## 2022-10-12 ENCOUNTER — Ambulatory Visit: Payer: BC Managed Care – PPO | Attending: General Surgery | Admitting: Physical Therapy

## 2022-10-12 ENCOUNTER — Other Ambulatory Visit: Payer: Self-pay

## 2022-10-12 DIAGNOSIS — Z170 Estrogen receptor positive status [ER+]: Secondary | ICD-10-CM

## 2022-10-12 DIAGNOSIS — C50411 Malignant neoplasm of upper-outer quadrant of right female breast: Secondary | ICD-10-CM | POA: Diagnosis present

## 2022-10-12 DIAGNOSIS — R293 Abnormal posture: Secondary | ICD-10-CM

## 2022-10-12 HISTORY — PX: BREAST BIOPSY: SHX20

## 2022-10-12 NOTE — Pre-Procedure Instructions (Signed)
Surgical Instructions    Your procedure is scheduled on Thursday 10/14/22.   Report to [facility] Main Entrance "A" at 07:00 A.M., then check in with the Admitting office.  Call this number if you have problems the morning of surgery:  [phone]   If you have any questions prior to your surgery date call [phone]: Open Monday-Friday 8am-4pm If you experience any cold or flu symptoms such as cough, fever, chills, shortness of breath, etc. between now and your scheduled surgery, please notify us at the above number     Remember:  Do not eat after midnight the night before your surgery  You may drink clear liquids until 06:00 A.M. the morning of your surgery.   Clear liquids allowed are: Water, Non-Citrus Juices (without pulp), Carbonated Beverages, Clear Tea, Black Coffee ONLY (NO MILK, CREAM OR POWDERED CREAMER of any kind), and Gatorade   Patient Instructions  The night before surgery:  No food after midnight. ONLY clear liquids after midnight  The day of surgery (if you do NOT have diabetes):  Drink ONE (1) Pre-Surgery Clear Ensure by 06:00 A.M. the morning of surgery. Drink in one sitting. Do not sip.  This drink was given to you during your hospital  pre-op appointment visit.  Nothing else to drink after completing the  Pre-Surgery Clear Ensure.         If you have questions, please contact your surgeon's office.   Take these medicines the morning of surgery with A SIP OF WATER:   diltiazem (CARDIZEM CD)   levothyroxine (SYNTHROID)    Take these medicines if needed:   acetaminophen (TYLENOL)   albuterol (VENTOLIN HFA)   benzonatate (TESSALON)   PULMICORT FLEXHALER   tamsulosin (FLOMAX)    As of today, STOP taking any Aspirin (unless otherwise instructed by your surgeon) Aleve, Naproxen, Ibuprofen, Motrin, Advil, Goody's, BC's, all herbal medications, fish oil, and all vitamins.           Do not wear jewelry or makeup. Do not wear lotions, powders,  perfumes/cologne or deodorant. Do not shave 48 hours prior to surgery.  Men may shave face and neck. Do not bring valuables to the hospital. Do not wear nail polish, gel polish, artificial nails, or any other type of covering on natural nails (fingers and toes) If you have artificial nails or gel coating that need to be removed by a nail salon, please have this removed prior to surgery. Artificial nails or gel coating may interfere with anesthesia's ability to adequately monitor your vital signs.  [facility] is not responsible for any belongings or valuables.    Do NOT Smoke (Tobacco/Vaping)  24 hours prior to your procedure  If you use a CPAP at night, you may bring your mask for your overnight stay.   Contacts, glasses, hearing aids, dentures or partials may not be worn into surgery, please bring cases for these belongings   For patients admitted to the hospital, discharge time will be determined by your treatment team.   Patients discharged the day of surgery will not be allowed to drive home, and someone needs to stay with them for 24 hours.   SURGICAL WAITING ROOM VISITATION Patients having surgery or a procedure may have no more than 2 support people in the waiting area - these visitors may rotate.   Children under the age of 2 must have an adult with them who is not the patient. If the patient needs to stay at the hospital during part  of their recovery, the visitor guidelines for inpatient rooms apply. Pre-op nurse will coordinate an appropriate time for 1 support person to accompany patient in pre-op.  This support person may not rotate.   Please refer to [email or web address] for the visitor guidelines for Inpatients (after your surgery is over and you are in a regular room).    Special instructions:    Oral Hygiene is also important to reduce your risk of infection.  Remember - BRUSH YOUR TEETH THE MORNING OF SURGERY WITH YOUR  REGULAR TOOTHPASTE   [place]- Preparing For Surgery  Before surgery, you can play an important role. Because skin is not sterile, your skin needs to be as free of germs as possible. You can reduce the number of germs on your skin by washing with CHG (chlorahexidine gluconate) Soap before surgery.  CHG is an antiseptic cleaner which kills germs and bonds with the skin to continue killing germs even after washing.     Please do not use if you have an allergy to CHG or antibacterial soaps. If your skin becomes reddened/irritated stop using the CHG.  Do not shave (including legs and underarms) for at least 48 hours prior to first CHG shower. It is OK to shave your face.  Please follow these instructions carefully.     Shower the NIGHT BEFORE SURGERY and the MORNING OF SURGERY with CHG Soap.   If you chose to wash your hair, wash your hair first as usual with your normal shampoo. After you shampoo, rinse your hair and body thoroughly to remove the shampoo.  Then [employer] and genitals (private parts) with your normal soap and rinse thoroughly to remove soap.  After that Use CHG Soap as you would any other liquid soap. You can apply CHG directly to the skin and wash gently with a scrungie or a clean washcloth.   Apply the CHG Soap to your body ONLY FROM THE NECK DOWN.  Do not use on open wounds or open sores. Avoid contact with your eyes, ears, mouth and genitals (private parts). Wash Face and genitals (private parts)  with your normal soap.   Wash thoroughly, paying special attention to the area where your surgery will be performed.  Thoroughly rinse your body with warm water from the neck down.  DO NOT shower/wash with your normal soap after using and rinsing off the CHG Soap.  Pat yourself dry with a CLEAN TOWEL.  Wear CLEAN PAJAMAS to bed the night before surgery  Place CLEAN SHEETS on your bed the night before your surgery  DO NOT SLEEP WITH PETS.   Day of Surgery:  Take a  shower with CHG soap. Wear Clean/Comfortable clothing the morning of surgery Do not apply any deodorants/lotions.   Remember to brush your teeth WITH YOUR REGULAR TOOTHPASTE.    If you received a COVID test during your pre-op visit, it is requested that you wear a mask when out in public, stay away from anyone that may not be feeling well, and notify your surgeon if you develop symptoms. If you have been in contact with anyone that has tested positive in the last 10 days, please notify your surgeon.    Please read over the following fact sheets that you were given.

## 2022-10-13 ENCOUNTER — Other Ambulatory Visit: Payer: Self-pay

## 2022-10-13 ENCOUNTER — Encounter (HOSPITAL_COMMUNITY): Payer: Self-pay

## 2022-10-13 ENCOUNTER — Encounter (HOSPITAL_COMMUNITY)
Admission: RE | Admit: 2022-10-13 | Discharge: 2022-10-13 | Disposition: A | Discharge status: Home / Self Care | Payer: BC Managed Care – PPO | Source: Ambulatory Visit | Attending: General Surgery | Admitting: General Surgery

## 2022-10-13 VITALS — BP 120/75 | HR 73 | Temp 97.7°F | Resp 17 | Ht 63.0 in | Wt 154.0 lb

## 2022-10-13 DIAGNOSIS — I4891 Unspecified atrial fibrillation: Secondary | ICD-10-CM | POA: Insufficient documentation

## 2022-10-13 DIAGNOSIS — I10 Essential (primary) hypertension: Secondary | ICD-10-CM | POA: Diagnosis not present

## 2022-10-13 DIAGNOSIS — E039 Hypothyroidism, unspecified: Secondary | ICD-10-CM | POA: Diagnosis not present

## 2022-10-13 DIAGNOSIS — J45909 Unspecified asthma, uncomplicated: Secondary | ICD-10-CM | POA: Diagnosis not present

## 2022-10-13 DIAGNOSIS — Z01818 Encounter for other preprocedural examination: Secondary | ICD-10-CM

## 2022-10-13 DIAGNOSIS — C773 Secondary and unspecified malignant neoplasm of axilla and upper limb lymph nodes: Secondary | ICD-10-CM | POA: Diagnosis not present

## 2022-10-13 DIAGNOSIS — Z170 Estrogen receptor positive status [ER+]: Secondary | ICD-10-CM | POA: Diagnosis not present

## 2022-10-13 DIAGNOSIS — C50411 Malignant neoplasm of upper-outer quadrant of right female breast: Secondary | ICD-10-CM | POA: Diagnosis not present

## 2022-10-13 DIAGNOSIS — Z8042 Family history of malignant neoplasm of prostate: Secondary | ICD-10-CM | POA: Diagnosis not present

## 2022-10-13 HISTORY — DX: Personal history of urinary calculi: Z87.442

## 2022-10-13 HISTORY — DX: Hypothyroidism, unspecified: E03.9

## 2022-10-13 HISTORY — DX: Malignant (primary) neoplasm, unspecified: C80.1

## 2022-10-13 HISTORY — DX: Dyspnea, unspecified: R06.00

## 2022-10-13 LAB — CBC
HCT: 39.7 % (ref 36.0–46.0)
Hemoglobin: 12.6 g/dL (ref 12.0–15.0)
MCH: 29.9 pg (ref 26.0–34.0)
MCHC: 31.7 g/dL (ref 30.0–36.0)
MCV: 94.1 fL (ref 80.0–100.0)
Platelets: 350 10*3/uL (ref 150–400)
RBC: 4.22 MIL/uL (ref 3.87–5.11)
RDW: 12.6 % (ref 11.5–15.5)
WBC: 11.1 10*3/uL — ABNORMAL HIGH (ref 4.0–10.5)
nRBC: 0 % (ref 0.0–0.2)

## 2022-10-13 LAB — BASIC METABOLIC PANEL
Anion gap: 10 (ref 5–15)
BUN: 14 mg/dL (ref 8–23)
CO2: 25 mmol/L (ref 22–32)
Calcium: 8.9 mg/dL (ref 8.9–10.3)
Chloride: 102 mmol/L (ref 98–111)
Creatinine, Ser: 0.89 mg/dL (ref 0.44–1.00)
GFR, Estimated: >60 mL/min (ref >60)
Glucose, Bld: 89 mg/dL (ref 70–99)
Potassium: 4.1 mmol/L (ref 3.5–5.1)
Sodium: 137 mmol/L (ref 135–145)

## 2022-10-13 NOTE — Progress Notes (Signed)
Anesthesia Chart Review:  Pt was evaluated by cardiologist Dr. Lutricia Feil in 2012 for episode of afib with RVR. Per notes, this was initially detected by her husband, Dr. Consuello Masse who contacted pt's PCP Dr. Ron Parker. She was treated with diltiazem and converted to sinus rhythm. Per Dr. Tennis Must Gent's note 11/30/10, echo showed EF 60-65%, no significant valvular abnormalities. She was noted to be doing well at that time without symptoms.  It was also noted that she was found to have hypothyroidism with TSH of 16.64 and was subsequently started on Synthroid.  She was not felt to require anticoagulation. Per note, "The patient does not need chronic anticoagulation.  She is at very low risk for thromboembolic risk.  She has no hypertension, diabetes mellitus, prior stroke or age about 67 years of age."  Patient did not have any additional cardiac follow-up.  Chronic conditions are followed by her PCP Dr. Gar Ponto at [facility] in [place].   Pt reports she has had no further episodes of afib. She denies any CV symptoms, no CP, SOB, DOE, palpitations.   Preop labs reviewed, unremarkable.  EKG 06/13/22: NSR. Rate 66.    Wynonia Musty [facility] Short Stay Center/Anesthesiology Phone [phone] 10/13/2022 3:27 PM

## 2022-10-13 NOTE — Anesthesia Preprocedure Evaluation (Signed)
Anesthesia Evaluation  Patient identified by MRN, date of birth, ID band Patient awake    Reviewed: Allergy & Precautions, NPO status , [name] Chart, lab work & pertinent test results  Airway Mallampati: III  TM Distance: >3 FB Neck ROM: Full    Dental  (+) Teeth Intact, Dental Advisory Given   Pulmonary asthma    Pulmonary exam normal breath sounds clear to auscultation       Cardiovascular hypertension, Pt. on medications + dysrhythmias Atrial Fibrillation  Rhythm:Regular Rate:Normal     Neuro/Psych negative neurological ROS  negative psych ROS   GI/Hepatic negative GI ROS, Neg liver ROS,,,  Endo/Other  Hypothyroidism    Renal/GU negative Renal ROS     Musculoskeletal negative musculoskeletal ROS (+)    Abdominal   Peds  Hematology negative hematology ROS (+)   Anesthesia Other Findings Day of surgery medications reviewed with the patient.  RIGHT BREAST CANCER  Reproductive/Obstetrics                             Anesthesia Physical Anesthesia Plan  ASA: 3  Anesthesia Plan: General   Post-op Pain Management: Regional block* and Tylenol PO (pre-op)*   Induction: Intravenous  PONV Risk Score and Plan: 3 and Midazolam, Dexamethasone and Ondansetron  Airway Management Planned: LMA  Additional Equipment:   Intra-op Plan:   Post-operative Plan: Extubation in OR  Informed Consent: I have reviewed the patients History and Physical, chart, labs and discussed the procedure including the risks, benefits and alternatives for the proposed anesthesia with the patient or authorized representative who has indicated his/her understanding and acceptance.     Dental advisory given  Plan Discussed with: CRNA  Anesthesia Plan Comments: (PAT note by Karoline Caldwell, PA-C: Pt was evaluated by cardiologist Dr. Lutricia Feil in 2012 for episode of afib with RVR. Per notes, this was initially  detected by her husband, Dr. Consuello Masse who contacted pt's PCP Dr. Ron Parker. She was treated with diltiazem and converted to sinus rhythm. Per Dr. Tennis Must Gent's note 11/30/10, echo showed EF 60-65%, no significant valvular abnormalities. She was noted to be doing well at that time without symptoms.  It was also noted that she was found to have hypothyroidism with TSH of 16.64 and was subsequently started on Synthroid.  She was not felt to require anticoagulation. Per note, "The patient does not need chronic anticoagulation.  She is at very low risk for thromboembolic risk.  She has no hypertension, diabetes mellitus, prior stroke or age about 67 years of age."  Patient did not have any additional cardiac follow-up.  Chronic conditions are followed by her PCP Dr. Gar Ponto at [facility] in [place].   Pt reports she has had no further episodes of afib. She denies any CV symptoms, no CP, SOB, DOE, palpitations.   Preop labs reviewed, unremarkable.  EKG 06/13/22: NSR. Rate 66.   )        Anesthesia Quick Evaluation

## 2022-10-13 NOTE — Progress Notes (Signed)
PCP - Dr. Gar Ponto at [facility]- Records Requested Cardiologist - denies. Saw Guy deGent in 2012  PPM/ICD - n/a Device Orders - n/a Rep Notified - n/a  Chest x-ray - n/a EKG - 10/13/22 Stress Test - denies ECHO - 2012 Cardiac Cath - denies  Sleep Study - denies CPAP - n/a  Fasting Blood Sugar - n/a Checks Blood Sugar _____ times a day- n/a  Last dose of GLP1 agonist-  n/a GLP1 instructions: n/a  Blood Thinner Instructions: n/a Aspirin Instructions: As today stop taking Aspirin. Patient states surgeon told her she does not have to stop Aspirin.   ERAS Protcol - Yes. Clear liquids until 0600 am day of surgery PRE-SURGERY Ensure or G2- Ensure  COVID TEST- n/a   Anesthesia review: Yes. Spoke to [employer], [place] during PAT visit. Records requested from Dr. Quillian Quince.   Patient denies shortness of breath, fever, cough and chest pain at PAT appointment   All instructions explained to the patient, with a verbal understanding of the material. Patient agrees to go over the instructions while at home for a better understanding. Patient also instructed to self quarantine after being tested for COVID-19. The opportunity to ask questions was provided.

## 2022-10-14 ENCOUNTER — Ambulatory Visit (HOSPITAL_COMMUNITY): Payer: BC Managed Care – PPO

## 2022-10-14 ENCOUNTER — Encounter (HOSPITAL_COMMUNITY): Payer: Self-pay | Admitting: General Surgery

## 2022-10-14 ENCOUNTER — Encounter (HOSPITAL_COMMUNITY)
Admission: RE | Disposition: A | Discharge status: Home / Self Care | Payer: Self-pay | Source: Home / Self Care | Attending: General Surgery

## 2022-10-14 ENCOUNTER — Ambulatory Visit (HOSPITAL_COMMUNITY): Payer: BC Managed Care – PPO | Admitting: Physician Assistant

## 2022-10-14 ENCOUNTER — Other Ambulatory Visit: Payer: Self-pay

## 2022-10-14 ENCOUNTER — Ambulatory Visit (HOSPITAL_COMMUNITY)
Admission: RE | Admit: 2022-10-14 | Discharge: 2022-10-14 | Disposition: A | Discharge status: Home / Self Care | Payer: BC Managed Care – PPO | Attending: General Surgery | Admitting: General Surgery

## 2022-10-14 ENCOUNTER — Ambulatory Visit (HOSPITAL_COMMUNITY): Payer: BC Managed Care – PPO | Admitting: Anesthesiology

## 2022-10-14 ENCOUNTER — Ambulatory Visit
Admission: RE | Admit: 2022-10-14 | Discharge: 2022-10-14 | Disposition: A | Discharge status: Home / Self Care | Payer: BC Managed Care – PPO | Source: Ambulatory Visit | Attending: General Surgery | Admitting: General Surgery

## 2022-10-14 DIAGNOSIS — J45909 Unspecified asthma, uncomplicated: Secondary | ICD-10-CM | POA: Insufficient documentation

## 2022-10-14 DIAGNOSIS — Z170 Estrogen receptor positive status [ER+]: Secondary | ICD-10-CM | POA: Insufficient documentation

## 2022-10-14 DIAGNOSIS — E039 Hypothyroidism, unspecified: Secondary | ICD-10-CM | POA: Insufficient documentation

## 2022-10-14 DIAGNOSIS — I10 Essential (primary) hypertension: Secondary | ICD-10-CM | POA: Insufficient documentation

## 2022-10-14 DIAGNOSIS — Z8042 Family history of malignant neoplasm of prostate: Secondary | ICD-10-CM | POA: Insufficient documentation

## 2022-10-14 DIAGNOSIS — C773 Secondary and unspecified malignant neoplasm of axilla and upper limb lymph nodes: Secondary | ICD-10-CM | POA: Insufficient documentation

## 2022-10-14 DIAGNOSIS — I4891 Unspecified atrial fibrillation: Secondary | ICD-10-CM | POA: Insufficient documentation

## 2022-10-14 DIAGNOSIS — C50411 Malignant neoplasm of upper-outer quadrant of right female breast: Secondary | ICD-10-CM | POA: Diagnosis not present

## 2022-10-14 HISTORY — PX: AXILLARY SENTINEL NODE BIOPSY: SHX5738

## 2022-10-14 HISTORY — PX: BREAST LUMPECTOMY WITH RADIOACTIVE SEED AND SENTINEL LYMPH NODE BIOPSY: SHX6550

## 2022-10-14 HISTORY — PX: PORTACATH PLACEMENT: SHX2246

## 2022-10-14 SURGERY — BREAST LUMPECTOMY WITH RADIOACTIVE SEED AND SENTINEL LYMPH NODE BIOPSY
Anesthesia: General | Site: Chest | Laterality: Right

## 2022-10-14 MED ORDER — MIDAZOLAM HCL 2 MG/2ML IJ SOLN: 2.0000 mg | Freq: Once | INTRAMUSCULAR | Status: AC

## 2022-10-14 MED ORDER — METHYLENE BLUE 1 % INJ SOLN
INTRAVENOUS | Status: AC
  Filled 2022-10-14: qty 10

## 2022-10-14 MED ORDER — HEPARIN SOD (PORK) LOCK FLUSH 100 UNIT/ML IV SOLN
INTRAVENOUS | Status: DC | PRN
  Administered 2022-10-14: 400 [IU]

## 2022-10-14 MED ORDER — BUPIVACAINE-EPINEPHRINE (PF) 0.5% -1:200000 IJ SOLN
INTRAMUSCULAR | Status: AC
  Filled 2022-10-14: qty 30

## 2022-10-14 MED ORDER — BUPIVACAINE-EPINEPHRINE (PF) 0.5% -1:200000 IJ SOLN
INTRAMUSCULAR | Status: DC | PRN
  Administered 2022-10-14: 30 mL via PERINEURAL

## 2022-10-14 MED ORDER — CHLORHEXIDINE GLUCONATE CLOTH 2 % EX PADS: 6.0000 | MEDICATED_PAD | Freq: Once | CUTANEOUS | Status: DC

## 2022-10-14 MED ORDER — TRAMADOL HCL 50 MG PO TABS
50.0000 mg | ORAL_TABLET | Freq: Once | ORAL | Status: AC
  Administered 2022-10-14: 50 mg via ORAL

## 2022-10-14 MED ORDER — PROPOFOL 10 MG/ML IV BOLUS
INTRAVENOUS | Status: AC
  Filled 2022-10-14: qty 20

## 2022-10-14 MED ORDER — ENSURE PRE-SURGERY PO LIQD: 296.0000 mL | Freq: Once | ORAL | Status: DC

## 2022-10-14 MED ORDER — HEPARIN SOD (PORK) LOCK FLUSH 100 UNIT/ML IV SOLN
INTRAVENOUS | Status: AC
  Filled 2022-10-14: qty 5

## 2022-10-14 MED ORDER — CHLORHEXIDINE GLUCONATE 0.12 % MT SOLN
15.0000 mL | Freq: Once | OROMUCOSAL | Status: AC
  Administered 2022-10-14: 15 mL via OROMUCOSAL
  Filled 2022-10-14: qty 15

## 2022-10-14 MED ORDER — FENTANYL CITRATE (PF) 250 MCG/5ML IJ SOLN
INTRAMUSCULAR | Status: DC | PRN
  Administered 2022-10-14: 50 ug via INTRAVENOUS

## 2022-10-14 MED ORDER — BUPIVACAINE-EPINEPHRINE (PF) 0.5% -1:200000 IJ SOLN
INTRAMUSCULAR | Status: DC | PRN
  Administered 2022-10-14: 5 mL via PERINEURAL

## 2022-10-14 MED ORDER — HEPARIN 6000 UNIT IRRIGATION SOLUTION
Status: DC | PRN
  Administered 2022-10-14: 1

## 2022-10-14 MED ORDER — PROMETHAZINE HCL 25 MG/ML IJ SOLN: 6.2500 mg | INTRAMUSCULAR | Status: DC | PRN

## 2022-10-14 MED ORDER — EPHEDRINE SULFATE-NACL 50-0.9 MG/10ML-% IV SOSY
PREFILLED_SYRINGE | INTRAVENOUS | Status: DC | PRN
  Administered 2022-10-14: 10 mg via INTRAVENOUS

## 2022-10-14 MED ORDER — DEXAMETHASONE SODIUM PHOSPHATE 10 MG/ML IJ SOLN
INTRAMUSCULAR | Status: DC | PRN
  Administered 2022-10-14: 10 mg

## 2022-10-14 MED ORDER — MIDAZOLAM HCL 2 MG/2ML IJ SOLN
INTRAMUSCULAR | Status: AC
  Administered 2022-10-14: 2 mg via INTRAVENOUS
  Filled 2022-10-14: qty 2

## 2022-10-14 MED ORDER — FENTANYL CITRATE (PF) 250 MCG/5ML IJ SOLN
INTRAMUSCULAR | Status: AC
  Filled 2022-10-14: qty 5

## 2022-10-14 MED ORDER — FENTANYL CITRATE (PF) 100 MCG/2ML IJ SOLN
INTRAMUSCULAR | Status: AC
  Administered 2022-10-14: 50 ug via INTRAVENOUS
  Filled 2022-10-14: qty 2

## 2022-10-14 MED ORDER — HEMOSTATIC AGENTS (NO CHARGE) OPTIME
TOPICAL | Status: DC | PRN
  Administered 2022-10-14: 1 via TOPICAL

## 2022-10-14 MED ORDER — PHENYLEPHRINE 80 MCG/ML (10ML) SYRINGE FOR IV PUSH (FOR BLOOD PRESSURE SUPPORT)
PREFILLED_SYRINGE | INTRAVENOUS | Status: DC | PRN
  Administered 2022-10-14 (×3): 80 ug via INTRAVENOUS

## 2022-10-14 MED ORDER — FENTANYL CITRATE (PF) 100 MCG/2ML IJ SOLN
25.0000 ug | INTRAMUSCULAR | Status: DC | PRN
  Administered 2022-10-14: 50 ug via INTRAVENOUS

## 2022-10-14 MED ORDER — 0.9 % SODIUM CHLORIDE (POUR BTL) OPTIME
TOPICAL | Status: DC | PRN
  Administered 2022-10-14: 1000 mL

## 2022-10-14 MED ORDER — FENTANYL CITRATE (PF) 100 MCG/2ML IJ SOLN
INTRAMUSCULAR | Status: AC
  Filled 2022-10-14: qty 2

## 2022-10-14 MED ORDER — DEXAMETHASONE SODIUM PHOSPHATE 10 MG/ML IJ SOLN
INTRAMUSCULAR | Status: DC | PRN
  Administered 2022-10-14: 4 mg via INTRAVENOUS

## 2022-10-14 MED ORDER — PROPOFOL 10 MG/ML IV BOLUS
INTRAVENOUS | Status: DC | PRN
  Administered 2022-10-14: 130 mg via INTRAVENOUS

## 2022-10-14 MED ORDER — ONDANSETRON HCL 4 MG/2ML IJ SOLN
INTRAMUSCULAR | Status: DC | PRN
  Administered 2022-10-14: 4 mg via INTRAVENOUS

## 2022-10-14 MED ORDER — MIDAZOLAM HCL 2 MG/2ML IJ SOLN
INTRAMUSCULAR | Status: AC
  Filled 2022-10-14: qty 2

## 2022-10-14 MED ORDER — HEPARIN 6000 UNIT IRRIGATION SOLUTION
Status: AC
  Filled 2022-10-14: qty 500

## 2022-10-14 MED ORDER — ORAL CARE MOUTH RINSE: 15.0000 mL | Freq: Once | OROMUCOSAL | Status: AC

## 2022-10-14 MED ORDER — TRAMADOL HCL 50 MG PO TABS: 50.0000 mg | ORAL_TABLET | Freq: Four times a day (QID) | ORAL | 0 refills | Status: DC | PRN

## 2022-10-14 MED ORDER — BUPIVACAINE HCL (PF) 0.25 % IJ SOLN
INTRAMUSCULAR | Status: AC
  Filled 2022-10-14: qty 30

## 2022-10-14 MED ORDER — FENTANYL CITRATE (PF) 100 MCG/2ML IJ SOLN: 50.0000 ug | Freq: Once | INTRAMUSCULAR | Status: AC

## 2022-10-14 MED ORDER — ACETAMINOPHEN 500 MG PO TABS: 1000.0000 mg | ORAL_TABLET | ORAL | Status: DC

## 2022-10-14 MED ORDER — MAGTRACE LYMPHATIC TRACER
INTRAMUSCULAR | Status: DC | PRN
  Administered 2022-10-14: 2 mL via INTRAMUSCULAR

## 2022-10-14 MED ORDER — LACTATED RINGERS IV SOLN: INTRAVENOUS | Status: DC

## 2022-10-14 MED ORDER — TRAMADOL HCL 50 MG PO TABS
ORAL_TABLET | ORAL | Status: AC
  Filled 2022-10-14: qty 1

## 2022-10-14 MED ORDER — CEFAZOLIN SODIUM-DEXTROSE 2-4 GM/100ML-% IV SOLN
2.0000 g | INTRAVENOUS | Status: AC
  Administered 2022-10-14: 2 g via INTRAVENOUS
  Filled 2022-10-14: qty 100

## 2022-10-14 MED ORDER — LIDOCAINE 2% (20 MG/ML) 5 ML SYRINGE
INTRAMUSCULAR | Status: DC | PRN
  Administered 2022-10-14: 60 mg via INTRAVENOUS

## 2022-10-14 SURGICAL SUPPLY — 64 items
ADH SKN CLS APL DERMABOND .7 (GAUZE/BANDAGES/DRESSINGS) ×6
APL PRP STRL LF DISP 70% ISPRP (MISCELLANEOUS) ×3
APPLIER CLIP 9.375 MED OPEN (MISCELLANEOUS) ×3
APR CLP MED 9.3 20 MLT OPN (MISCELLANEOUS) ×3
BAG DECANTER FOR FLEXI CONT (MISCELLANEOUS) ×3 IMPLANT
BINDER BREAST LRG (GAUZE/BANDAGES/DRESSINGS) IMPLANT
BINDER BREAST XLRG (GAUZE/BANDAGES/DRESSINGS) IMPLANT
CANISTER SUCT 3000ML PPV (MISCELLANEOUS) ×3 IMPLANT
CHLORAPREP W/TINT 26 (MISCELLANEOUS) ×3 IMPLANT
CLIP APPLIE 9.375 MED OPEN (MISCELLANEOUS) IMPLANT
CLIP VESOCCLUDE MED 6/CT (CLIP) ×3 IMPLANT
CLSR STERI-STRIP ANTIMIC 1/2X4 (GAUZE/BANDAGES/DRESSINGS) IMPLANT
COVER PROBE W GEL 5X96 (DRAPES) ×6 IMPLANT
COVER SURGICAL LIGHT HANDLE (MISCELLANEOUS) ×3 IMPLANT
DERMABOND ADVANCED .7 DNX12 (GAUZE/BANDAGES/DRESSINGS) ×3 IMPLANT
DRAPE C-ARM 42X120 X-RAY (DRAPES) ×3 IMPLANT
DRAPE ORTHO SPLIT 77X108 STRL (DRAPES) ×3
DRAPE SURG ORHT 6 SPLT 77X108 (DRAPES) IMPLANT
DRAPE TOP ARMCOVERS (MISCELLANEOUS) IMPLANT
DRAPE UTILITY XL STRL (DRAPES) IMPLANT
ELECT CAUTERY BLADE 6.4 (BLADE) ×3 IMPLANT
ELECT COATED BLADE 2.86 ST (ELECTRODE) ×3 IMPLANT
ELECT REM PT RETURN 9FT ADLT (ELECTROSURGICAL) ×3
ELECTRODE REM PT RTRN 9FT ADLT (ELECTROSURGICAL) ×3 IMPLANT
GAUZE 4X4 16PLY ~~LOC~~+RFID DBL (SPONGE) ×3 IMPLANT
GEL ULTRASOUND 20GR AQUASONIC (MISCELLANEOUS) ×3 IMPLANT
GLOVE BIO SURGEON STRL SZ7 (GLOVE) ×3 IMPLANT
GLOVE BIOGEL PI IND STRL 7.5 (GLOVE) ×3 IMPLANT
GOWN STRL REUS W/ TWL LRG LVL3 (GOWN DISPOSABLE) ×6 IMPLANT
GOWN STRL REUS W/TWL LRG LVL3 (GOWN DISPOSABLE) ×6
HEMOSTAT ARISTA ABSORB 3G PWDR (HEMOSTASIS) IMPLANT
KIT BASIN OR (CUSTOM PROCEDURE TRAY) ×3 IMPLANT
KIT MARKER MARGIN INK (KITS) ×3 IMPLANT
KIT PORT POWER 8FR ISP CVUE (Port) ×3 IMPLANT
KIT TURNOVER KIT B (KITS) ×3 IMPLANT
NDL 18GX1X1/2 (RX/OR ONLY) (NEEDLE) IMPLANT
NDL FILTER BLUNT 18X1 1/2 (NEEDLE) IMPLANT
NDL HYPO 25GX1X1/2 BEV (NEEDLE) ×3 IMPLANT
NEEDLE 18GX1X1/2 (RX/OR ONLY) (NEEDLE) IMPLANT
NEEDLE FILTER BLUNT 18X1 1/2 (NEEDLE) IMPLANT
NEEDLE HYPO 25GX1X1/2 BEV (NEEDLE) ×3 IMPLANT
NS IRRIG 1000ML POUR BTL (IV SOLUTION) ×3 IMPLANT
PACK GENERAL/GYN (CUSTOM PROCEDURE TRAY) ×3 IMPLANT
PAD ARMBOARD 7.5X6 YLW CONV (MISCELLANEOUS) ×6 IMPLANT
PENCIL BUTTON HOLSTER BLD 10FT (ELECTRODE) ×3 IMPLANT
PENCIL SMOKE EVACUATOR (MISCELLANEOUS) IMPLANT
POSITIONER HEAD DONUT 9IN (MISCELLANEOUS) ×3 IMPLANT
STRIP CLOSURE SKIN 1/2X4 (GAUZE/BANDAGES/DRESSINGS) ×3 IMPLANT
SUT MNCRL AB 4-0 PS2 18 (SUTURE) ×6 IMPLANT
SUT PROLENE 2 0 SH DA (SUTURE) ×3 IMPLANT
SUT SILK 2 0 SH (SUTURE) IMPLANT
SUT VIC AB 2-0 SH 27 (SUTURE) ×9
SUT VIC AB 2-0 SH 27X BRD (SUTURE) IMPLANT
SUT VIC AB 2-0 SH 27XBRD (SUTURE) ×6 IMPLANT
SUT VIC AB 3-0 SH 27 (SUTURE) ×12
SUT VIC AB 3-0 SH 27X BRD (SUTURE) ×6 IMPLANT
SUT VIC AB 3-0 SH 27XBRD (SUTURE) ×3 IMPLANT
SYR 5ML LUER SLIP (SYRINGE) ×3 IMPLANT
SYR CONTROL 10ML LL (SYRINGE) ×3 IMPLANT
TOWEL GREEN STERILE (TOWEL DISPOSABLE) ×3 IMPLANT
TOWEL GREEN STERILE FF (TOWEL DISPOSABLE) ×3 IMPLANT
TRAY LAPAROSCOPIC MC (CUSTOM PROCEDURE TRAY) ×3 IMPLANT
TUBE CONNECTING 12X1/4 (SUCTIONS) IMPLANT
YANKAUER SUCT BULB TIP NO VENT (SUCTIONS) IMPLANT

## 2022-10-14 NOTE — Anesthesia Procedure Notes (Signed)
Anesthesia Regional Block: Pectoralis block   Pre-Anesthetic Checklist: , timeout performed,  Correct Patient, Correct Site, Correct Laterality,  Correct Procedure, Correct Position, site marked,  Risks and benefits discussed,  Surgical consent,  Pre-op evaluation,  At surgeon's request and post-op pain management  Laterality: Right  Prep: chloraprep       Needles:  Injection technique: Single-shot  Needle Type: Echogenic Needle     Needle Length: 9cm  Needle Gauge: 21     Additional Needles:   Procedures:,,,, ultrasound used (permanent image in chart),,    Narrative:  Start time: 10/14/2022 8:00 AM End time: 10/14/2022 8:09 AM Injection made incrementally with aspirations every 5 mL.  Performed by: Personally  Anesthesiologist: Santa Lighter, MD  Additional Notes: No pain on injection. No increased resistance to injection. Injection made in 5cc increments.  Good needle visualization.  Patient tolerated procedure well.

## 2022-10-14 NOTE — H&P (View-Only) (Deleted)
67 year old otherwise fairly healthy female who has no prior breast history. She has a family history of prostate cancer in her father and her brother who had an at age 11. She had no mass or discharge. She underwent a screening mammogram that shows C density breast tissue. She was noted to have a right breast mass. On ultrasound this was 1.4 x 0.9 x 1.2 cm. There is no axillary adenopathy on ultrasound. 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 She is here with her husband Dr. Quintin Alto who works in [place] to discuss options.  Review of Systems: A complete review of systems was obtained from the patient. I have reviewed this information and discussed as appropriate with the patient. See HPI as well for other ROS.  Review of Systems  All other systems reviewed and are negative.   Medical History: Past Medical History:  Diagnosis Date  Arrhythmia  Asthma, unspecified asthma severity, unspecified whether complicated, unspecified whether persistent   Patient Active Problem List  Diagnosis  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female, estrogen receptor positive   Past Surgical History:  Procedure Laterality Date  APPENDECTOMY    Allergies  Allergen Reactions  Penicillins Rash  REACTION: ?childhood reaction   Current Outpatient Medications on File Prior to Visit  Medication Sig Dispense Refill  acetaminophen (TYLENOL) 500 MG tablet Take by mouth  albuterol 90 mcg/actuation inhaler INHALE TWO PUFFS EVERY 4 HOURS AS NEEDED  aspirin 81 MG chewable tablet Take 81 mg by mouth once daily  benzonatate (TESSALON) 200 MG capsule TAKE ONE CAPSULE BY MOUTH THREE TIMES DAILY AS NEEDED FOR COUGH  dilTIAZem (CARDIZEM CD) 120 MG XR capsule Take 120 mg by mouth once daily  fluticasone propion-salmeteroL (ADVAIR HFA) 115-21 mcg/actuation inhaler Inhale into the  lungs  levothyroxine (SYNTHROID) 75 MCG tablet Take 75 mcg by mouth once daily  tamsulosin (FLOMAX) 0.4 mg capsule Take by mouth   No current facility-administered medications on file prior to visit.   History reviewed. No pertinent family history.   Social History   Tobacco Use  Smoking Status Never  Smokeless Tobacco Never    Social History   Socioeconomic History  Marital status: Married  Tobacco Use  Smoking status: Never  Smokeless tobacco: Never  Substance and Sexual Activity  Alcohol use: Not Currently  Drug use: Not Currently   Objective:   Vitals:  10/05/22 0840  BP: 122/72  Pulse: 85  Weight: 69.5 kg (153 lb 3.2 oz)  Height: 160 cm ('5\' 3"'$ )   Body mass index is 27.14 kg/m.  Physical Exam Vitals reviewed.  Constitutional:  Appearance: Normal appearance.  Chest:  Breasts: Right: No inverted nipple, mass or nipple discharge.  Left: No inverted nipple, mass or nipple discharge.  Lymphadenopathy:  Upper Body:  Right upper body: No supraclavicular or axillary adenopathy.  Left upper body: No supraclavicular or axillary adenopathy.  Neurological:  Mental Status: She is alert.     Assessment and Plan:   Diagnoses and all orders for this visit:  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female, estrogen receptor positive  - Breast Prosthesis  Right breast seed guided lumpectomy, right ax sn biopsy, port placement  We discussed the staging and pathophysiology of breast cancer. We discussed all of the different options for treatment for breast cancer including surgery, chemotherapy, radiation therapy, Herceptin, and antiestrogen  therapy.  We discussed a sentinel lymph node biopsy as she does not appear to having lymph node involvement right now. We discussed the performance of that with injection of Magtrace. We discussed skin discoloration. We discussed that there is a chance of having a positive node with a sentinel lymph node  biopsy and we will await the permanent pathology to make any other first further decisions in terms of her treatment. We discussed up to a 5% risk lifetime of chronic shoulder pain as well as lymphedema associated with a sentinel lymph node biopsy.  We discussed the options for treatment of the breast cancer which included lumpectomy versus a mastectomy. We discussed the performance of the lumpectomy with radioactive seed placement. We discussed a 5-10% chance of a positive margin requiring reexcision in the operating room. We also discussed that she will likely need radiation therapy if she undergoes lumpectomy. We discussed mastectomy and the postoperative care for that as well. Mastectomy can be followed by reconstruction. The decision for lumpectomy vs mastectomy has no impact on decision for chemotherapy. Most mastectomy patients will not need radiation therapy. We discussed that there is no difference in her survival whether she undergoes lumpectomy with radiation therapy or antiestrogen therapy versus a mastectomy. There is also no real difference between her recurrence in the breast.  We discussed the risks of operation including bleeding, infection, possible reoperation. She understands her further therapy will be based on what her stages at the time of her operation.

## 2022-10-14 NOTE — Transfer of Care (Signed)
Immediate Anesthesia Transfer of Care Note  Patient: Caitlin [name]  Procedure(s) Performed: RIGHT BREAST LUMPECTOMY WITH RADIOACTIVE SEED (Right: Breast) INSERTION PORT-A-CATH (Left: Chest) RIGHT AXILLARY SENTINEL NODE BIOPSY (Right: Axilla)  Patient Location: PACU  Anesthesia Type:General  Level of Consciousness: awake, alert , and oriented  Airway & Oxygen Therapy: Patient Spontanous Breathing  Post-op Assessment: Report given to RN and Post -op Vital signs reviewed and stable  Post vital signs: Reviewed and stable  Last Vitals:  Vitals Value Taken Time  BP 116/61 10/14/22 1110  Temp    Pulse 81 10/14/22 1114  Resp 11 10/14/22 1114  SpO2 99 % 10/14/22 1114  Vitals shown include unvalidated device data.  Last Pain:  Vitals:   10/14/22 0815  TempSrc:   PainSc: 0-No pain      Patients Stated Pain Goal: 0 (96/11/64 3539)  Complications: No notable events documented.

## 2022-10-14 NOTE — Anesthesia Procedure Notes (Signed)
Procedure Name: LMA Insertion Date/Time: 10/14/2022 9:21 AM  Performed by: Babs Bertin, CRNAPre-anesthesia Checklist: Patient identified, Emergency Drugs available, Suction available and Patient being monitored Patient Re-evaluated:Patient Re-evaluated prior to induction Oxygen Delivery Method: Circle System Utilized Preoxygenation: Pre-oxygenation with 100% oxygen Induction Type: IV induction Ventilation: Mask ventilation without difficulty LMA: LMA inserted LMA Size: 4.0 Number of attempts: 1 Airway Equipment and Method: Bite block Placement Confirmation: positive ETCO2 Tube secured with: Tape Dental Injury: Teeth and Oropharynx as per pre-operative assessment

## 2022-10-14 NOTE — Discharge Instructions (Signed)
[facility] Office Phone Number [phone]  POST OP INSTRUCTIONS Take 400 mg of ibuprofen every 8 hours or 650 mg tylenol every 6 hours for next 72 hours then as needed. Use ice several times daily also.  A prescription for pain medication may be given to you upon discharge.  Take your pain medication as prescribed, if needed.  If narcotic pain medicine is not needed, then you may take acetaminophen (Tylenol), naprosyn (Alleve) or ibuprofen (Advil) as needed. Take your usually prescribed medications unless otherwise directed If you need a refill on your pain medication, please contact your pharmacy.  They will contact our office to request authorization.  Prescriptions will not be filled after 5pm or on week-ends. You should eat very light the first 24 hours after surgery, such as soup, crackers, pudding, etc.  Resume your normal diet the day after surgery. Most patients will experience some swelling and bruising in the breast.  Ice packs and a good support bra will help.  Wear the breast binder provided or a sports bra for 72 hours day and night.  After that wear a sports bra during the day until you return to the office. Swelling and bruising can take several days to resolve.  It is common to experience some constipation if taking pain medication after surgery.  Increasing fluid intake and taking a stool softener will usually help or prevent this problem from occurring.  A mild laxative (Milk of Magnesia or Miralax) should be taken according to package directions if there are no bowel movements after 48 hours. I used skin glue on the incision, you may shower in 24 hours.  The glue will flake off over the next 2-3 weeks.  Any sutures or staples will be removed at the office during your follow-up visit. ACTIVITIES:  You may resume regular daily activities (gradually increasing) beginning the next day.  Wearing a good support bra or sports bra minimizes pain and swelling.  You may have  sexual intercourse when it is comfortable. You may drive when you no longer are taking prescription pain medication, you can comfortably wear a seatbelt, and you can safely maneuver your car and apply brakes. RETURN TO WORK:  ______________________________________________________________________________________ You should see your doctor in the office for a follow-up appointment approximately two weeks after your surgery.  Your doctor's nurse will typically make your follow-up appointment when she calls you with your pathology report.  Expect your pathology report 3-4 business days after your surgery.  You may call to check if you do not hear from us after three days. OTHER INSTRUCTIONS: _______________________________________________________________________________________________ _____________________________________________________________________________________________________________________________________ _____________________________________________________________________________________________________________________________________ _____________________________________________________________________________________________________________________________________  WHEN TO CALL DR Kirra Verga: Fever over 101.0 Nausea and/or vomiting. Extreme swelling or bruising. Continued bleeding from incision. Increased pain, redness, or drainage from the incision.  The clinic staff is available to answer your questions during regular business hours.  Please don't hesitate to call and ask to speak to one of the nurses for clinical concerns.  If you have a medical emergency, go to the nearest emergency room or call 911.  A surgeon from [facility] is always on call at the hospital.  For further questions, please visit [email or web address]  

## 2022-10-14 NOTE — Anesthesia Postprocedure Evaluation (Signed)
Anesthesia Post Note  Patient: RAYLIN DIGUGLIELMO  Procedure(s) Performed: RIGHT BREAST LUMPECTOMY WITH RADIOACTIVE SEED (Right: Breast) INSERTION PORT-A-CATH (Left: Chest) RIGHT AXILLARY SENTINEL NODE BIOPSY (Right: Axilla)     Patient location during evaluation: PACU Anesthesia Type: General Level of consciousness: awake and alert Pain management: pain level controlled Vital Signs Assessment: post-procedure vital signs reviewed and stable Respiratory status: spontaneous breathing, nonlabored ventilation, respiratory function stable and patient connected to nasal cannula oxygen Cardiovascular status: blood pressure returned to baseline and stable Postop Assessment: no apparent nausea or vomiting Anesthetic complications: no   No notable events documented.  Last Vitals:  Vitals:   10/14/22 1130 10/14/22 1145  BP: 105/62 112/76  Pulse: 77 76  Resp: 16 12  Temp:    SpO2: 95% 96%    Last Pain:  Vitals:   10/14/22 1130  TempSrc:   PainSc: [place]

## 2022-10-14 NOTE — Interval H&P Note (Signed)
History and Physical Interval Note:  10/14/2022 9:08 AM  Caitlin [name]  has presented today for surgery, with the diagnosis of RIGHT BREAST CANCER.  The various methods of treatment have been discussed with the patient and family. After consideration of risks, benefits and other options for treatment, the patient has consented to  Procedure(s): RIGHT BREAST LUMPECTOMY WITH RADIOACTIVE SEED (Right) INSERTION PORT-A-CATH (N/A) RIGHT AXILLARY SENTINEL NODE BIOPSY (Right) as a surgical intervention.  The patient's history has been reviewed, patient examined, no change in status, stable for surgery.  I have reviewed the patient's chart and labs.  Questions were answered to the patient's satisfaction.     Rolm Bookbinder

## 2022-10-14 NOTE — H&P (Signed)
67 year old otherwise fairly healthy female who has no prior breast history. She has a family history of prostate cancer in her father and her brother who had an at age 27. She had no mass or discharge. She underwent a screening mammogram that shows C density breast tissue. She was noted to have a right breast mass. On ultrasound this was 1.4 x 0.9 x 1.2 cm. There is no axillary adenopathy on ultrasound. 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 She is here with her husband Dr. Quintin Alto who works in [place] to discuss options.  Review of Systems: A complete review of systems was obtained from the patient. I have reviewed this information and discussed as appropriate with the patient. See HPI as well for other ROS.  Review of Systems  All other systems reviewed and are negative.   Medical History: Past Medical History:  Diagnosis Date  Arrhythmia  Asthma, unspecified asthma severity, unspecified whether complicated, unspecified whether persistent   Patient Active Problem List  Diagnosis  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female, estrogen receptor positive   Past Surgical History:  Procedure Laterality Date  APPENDECTOMY    Allergies  Allergen Reactions  Penicillins Rash  REACTION: ?childhood reaction   Current Outpatient Medications on File Prior to Visit  Medication Sig Dispense Refill  acetaminophen (TYLENOL) 500 MG tablet Take by mouth  albuterol 90 mcg/actuation inhaler INHALE TWO PUFFS EVERY 4 HOURS AS NEEDED  aspirin 81 MG chewable tablet Take 81 mg by mouth once daily  benzonatate (TESSALON) 200 MG capsule TAKE ONE CAPSULE BY MOUTH THREE TIMES DAILY AS NEEDED FOR COUGH  dilTIAZem (CARDIZEM CD) 120 MG XR capsule Take 120 mg by mouth once daily  fluticasone propion-salmeteroL (ADVAIR HFA) 115-21 mcg/actuation inhaler Inhale into the  lungs  levothyroxine (SYNTHROID) 75 MCG tablet Take 75 mcg by mouth once daily  tamsulosin (FLOMAX) 0.4 mg capsule Take by mouth   No current facility-administered medications on file prior to visit.   History reviewed. No pertinent family history.   Social History   Tobacco Use  Smoking Status Never  Smokeless Tobacco Never    Social History   Socioeconomic History  Marital status: Married  Tobacco Use  Smoking status: Never  Smokeless tobacco: Never  Substance and Sexual Activity  Alcohol use: Not Currently  Drug use: Not Currently   Objective:   Vitals:  10/05/22 0840  BP: 122/72  Pulse: 85  Weight: 69.5 kg (153 lb 3.2 oz)  Height: 160 cm ('5\' 3"'$ )   Body mass index is 27.14 kg/m.  Physical Exam Vitals reviewed.  Constitutional:  Appearance: Normal appearance.  Chest:  Breasts: Right: No inverted nipple, mass or nipple discharge.  Left: No inverted nipple, mass or nipple discharge.  Lymphadenopathy:  Upper Body:  Right upper body: No supraclavicular or axillary adenopathy.  Left upper body: No supraclavicular or axillary adenopathy.  Neurological:  Mental Status: She is alert.     Assessment and Plan:   Diagnoses and all orders for this visit:  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female, estrogen receptor positive  - Breast Prosthesis  Right breast seed guided lumpectomy, right ax sn biopsy, port placement  We discussed the staging and pathophysiology of breast cancer. We discussed all of the different options for treatment for breast cancer including surgery, chemotherapy, radiation therapy, Herceptin, and antiestrogen  therapy.  We discussed a sentinel lymph node biopsy as she does not appear to having lymph node involvement right now. We discussed the performance of that with injection of Magtrace. We discussed skin discoloration. We discussed that there is a chance of having a positive node with a sentinel lymph node  biopsy and we will await the permanent pathology to make any other first further decisions in terms of her treatment. We discussed up to a 5% risk lifetime of chronic shoulder pain as well as lymphedema associated with a sentinel lymph node biopsy.  We discussed the options for treatment of the breast cancer which included lumpectomy versus a mastectomy. We discussed the performance of the lumpectomy with radioactive seed placement. We discussed a 5-10% chance of a positive margin requiring reexcision in the operating room. We also discussed that she will likely need radiation therapy if she undergoes lumpectomy. We discussed mastectomy and the postoperative care for that as well. Mastectomy can be followed by reconstruction. The decision for lumpectomy vs mastectomy has no impact on decision for chemotherapy. Most mastectomy patients will not need radiation therapy. We discussed that there is no difference in her survival whether she undergoes lumpectomy with radiation therapy or antiestrogen therapy versus a mastectomy. There is also no real difference between her recurrence in the breast.  We discussed the risks of operation including bleeding, infection, possible reoperation. She understands her further therapy will be based on what her stages at the time of her operation.

## 2022-10-14 NOTE — Op Note (Signed)
Preoperative diagnosis: Clinical stage I right breast cancer Postoperative diagnosis: Same as above Procedure 1.  Right breast radioactive seed guided lumpectomy 2.  Injection of mag trace for sentinel lymph node identification 3.  Right deep axillary sentinel lymph node biopsy 4.  Port placement  Surgeon: Dr. Serita Grammes Estimated blood loss: Minimal Anesthesia: General with a pectoral block Drains: None Specimens: 1.  Right breast tissue marked with paint containing clip seed 2. Additional lateral, inferior, medial, posterior and anterior margins marked short stitch superior, long stitch lateral, double stitch deep, additional anterior margin 3. Right deep axillary sentinel nodes Complications: None Special counts correct completion Disposition recovery stable condition   Indications: 67 year old otherwise fairly healthy female who has no prior breast history. She has a family history of prostate cancer in her father and her brother who had an at age 29. She had no mass or discharge. She underwent a screening mammogram that shows C density breast tissue. She was noted to have a right breast mass. On ultrasound this was 1.4 x 0.9 x 1.2 cm. There is no axillary adenopathy on ultrasound. 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 We discussed lumpectomy, sn biopsy and port placement.   Procedure: After informed consent obtained she first underwent a pectoral block.  Antibiotics were given.  SCDs were placed.  She was placed under general anesthesia without complication.  She was prepped and draped in standard sterile surgical fashion.  Surgical timeout was then performed.   I then injected 2 cc of mag trace in the subareolar position and massages for 5 minutes.   I first did the port.  I then identified the IJ and accessed it with the needle. The wire was placed and I  confirmed this with [place] and fluoro.  I then made an incision and created a pocket inferiorly below clavicle.  I then tunneled the line between the two sites.  I placed the dilator under fluoro.  I then placed the line and removed the peel away sheath.  I pulled the line back so that the tip was at the cavoatrial junction in good position for usage. I confirmed this with fluoro.  I then hooked this to the port and suture it to the muscle.  This flushed easily, aspirated blood and I packed it with heparin. I then closed this with 3-0 vicryl and 4-0 monocryl. Glue was placed.  I then performed a lumpectomy first.  II did both the lumpectomy and through the same incision.  I dissected to the seed. I then using the neoprobe I removed the mass and surrounding tissue to get a clear margin. MM confirmed removal of the seed and the clip.   I did a 3D image and I thought I was close to several margins so I removed these.  I then obtained hemostasis.  I placed clips in the cavity.  I closed the breast tissue down with 2-0 Vicryl.  I dissected through the axillary fascia into the axilla proper.  Using the magtrace was able to identify a couple of lymph nodes with highest count of 225.  These were removed and passed off the table as sentinel nodes.  I then obtained hemostasis. There were no other activity and there were no palpable nodes present. The skin was closed with 3-0 Vicryl as well as 5-0 Monocryl.  Glue and Steri-Strips were eventually placed.  I  She tolerated this well was extubated transferred recovery stable.

## 2022-10-15 ENCOUNTER — Encounter (HOSPITAL_COMMUNITY): Payer: Self-pay | Admitting: General Surgery

## 2022-10-18 ENCOUNTER — Ambulatory Visit: Payer: Self-pay | Admitting: Genetic Counselor

## 2022-10-18 DIAGNOSIS — Z1379 Encounter for other screening for genetic and chromosomal anomalies: Secondary | ICD-10-CM

## 2022-10-18 DIAGNOSIS — C50411 Malignant neoplasm of upper-outer quadrant of right female breast: Secondary | ICD-10-CM

## 2022-10-18 NOTE — Progress Notes (Signed)
HPI:   Caitlin [name] was previously seen in the [place] clinic due to a personal and family history of cancer and concerns regarding a hereditary predisposition to cancer. Please refer to our prior cancer genetics clinic note for more information regarding our discussion, assessment and recommendations, at the time. Ms. Lowenstein recent genetic test results were disclosed to her, as were recommendations warranted by these results. These results and recommendations are discussed in more detail below.  CANCER HISTORY:  Oncology History  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female, estrogen receptor positive ([place])  09/15/2022 Initial Diagnosis   Screening mammogram detected right breast mass which measured 1.4 cm by ultrasound at 10 o'clock position, biopsy revealed grade 3 IDC with lymphovascular invasion ER 10%, PR 0%, Ki-67 30%, HER2 0 negative   09/25/2022 Cancer Staging   Staging form: Breast, AJCC 8th Edition - Clinical: Stage IB (cT1c, cN0, cM0, G3, ER+, PR-, HER2-) - Signed by Nicholas Lose, MD on 09/25/2022 Histologic grading system: 3 grade system    Genetic Testing   [facility] CancerNext-Expanded Panel+RNA was Negative. Report date is 10/06/2021.  The CancerNext-Expanded gene panel offered by [facility] and includes sequencing, rearrangement, and RNA analysis for the following 77 genes: AIP, ALK, APC, ATM, AXIN2, BAP1, BARD1, BLM, BMPR1A, BRCA1, BRCA2, BRIP1, CDC73, CDH1, CDK4, CDKN1B, CDKN2A, CHEK2, CTNNA1, DICER1, FANCC, FH, FLCN, GALNT12, KIF1B, LZTR1, MAX, MEN1, MET, MLH1, MSH2, MSH3, MSH6, MUTYH, NBN, NF1, NF2, NTHL1, PALB2, PHOX2B, PMS2, POT1, PRKAR1A, PTCH1, PTEN, RAD51C, RAD51D, RB1, RECQL, RET, SDHA, SDHAF2, SDHB, SDHC, SDHD, SMAD4, SMARCA4, SMARCB1, SMARCE1, STK11, SUFU, TMEM127, TP53, TSC1, TSC2, VHL and XRCC2 (sequencing and deletion/duplication); EGFR, EGLN1, HOXB13, KIT, MITF, PDGFRA, POLD1, and POLE (sequencing only); EPCAM and GREM1  (deletion/duplication only).      FAMILY HISTORY:  We obtained a detailed, 4-generation family history.  Significant diagnoses are listed below:      Family History  Problem Relation Age of Onset   Thyroid disease Mother     Prostate cancer Father 24        metastatic   Hypertension Father     Thyroid disease Sister          twin sister age 80   Asthma Sister     Prostate cancer Brother 41   Heart attack Maternal Grandmother          died age 20's   Heart attack Maternal Grandfather          died age 61's   Clotting disorder Paternal Grandmother          cerebral hemorrage           Ms. Spires's brother was diagnosed with prostate cancer at age 16, he is currently 51. Her maternal great grandmother (grandmother's mother) possibly had breast cancer, she is deceased. Ms. Bickert father was diagnosed with prostate cancer at age 70 and died due to metastatic prostate cancer at age 17. Ms. Bohne is unaware of previous family history of genetic testing for hereditary cancer risks. There is no reported Ashkenazi Jewish ancestry.  GENETIC TEST RESULTS:  The [facility] CancerNext-Expanded Panel found no pathogenic mutations.   The CancerNext-Expanded gene panel offered by [facility] and includes sequencing, rearrangement, and RNA analysis for the following 77 genes: AIP, ALK, APC, ATM, AXIN2, BAP1, BARD1, BLM, BMPR1A, BRCA1, BRCA2, BRIP1, CDC73, CDH1, CDK4, CDKN1B, CDKN2A, CHEK2, CTNNA1, DICER1, FANCC, FH, FLCN, GALNT12, KIF1B, LZTR1, MAX, MEN1, MET, MLH1, MSH2, MSH3, MSH6, MUTYH, NBN, NF1, NF2,  NTHL1, PALB2, PHOX2B, PMS2, POT1, PRKAR1A, PTCH1, PTEN, RAD51C, RAD51D, RB1, RECQL, RET, SDHA, SDHAF2, SDHB, SDHC, SDHD, SMAD4, SMARCA4, SMARCB1, SMARCE1, STK11, SUFU, TMEM127, TP53, TSC1, TSC2, VHL and XRCC2 (sequencing and deletion/duplication); EGFR, EGLN1, HOXB13, KIT, MITF, PDGFRA, POLD1, and POLE (sequencing only); EPCAM and GREM1 (deletion/duplication only).    The test report has been  scanned into EPIC and is located under the Molecular Pathology section of the Results Review tab.  A portion of the result report is included below for reference. Genetic testing reported out on 10/06/2022.        Even though a pathogenic variant was not identified, possible explanations for the cancer in the family may include: There may be no hereditary risk for cancer in the family. The cancers in Ms. Malachi and/or her family may be due to other genetic or environmental factors. There may be a gene mutation in one of these genes that current testing methods cannot detect, but that chance is small. There could be another gene that has not yet been discovered, or that we have not yet tested, that is responsible for the cancer diagnoses in the family.   Therefore, it is important to remain in touch with cancer genetics in the future so that we can continue to offer Ms. Trapani the most up to date genetic testing.   ADDITIONAL GENETIC TESTING:  We discussed with Ms. Mccumbers that her genetic testing was fairly extensive.  If there are genes identified to increase cancer risk that can be analyzed in the future, we would be happy to discuss and coordinate this testing at that time.    CANCER SCREENING RECOMMENDATIONS:  Ms. Hackley test result is considered negative (normal). This means that we have not identified a hereditary cause for her personal and family history of cancer at this time.     An individual's cancer risk and medical management are not determined by genetic test results alone. Overall cancer risk assessment incorporates additional factors, including personal medical history, family history, and any available genetic information that may result in a personalized plan for cancer prevention and surveillance. Therefore, it is recommended she continue to follow the cancer management and screening guidelines provided by her oncology and primary healthcare provider.  RECOMMENDATIONS FOR  FAMILY MEMBERS:   Since she did not inherit a mutation in a cancer predisposition gene included on this panel, her children could not have inherited a mutation from her in one of these genes. Individuals in this family might be at some increased risk of developing cancer, over the general population risk, due to the family history of cancer. We recommend women in this family have a yearly mammogram beginning at age 49, or 80 years younger than the earliest onset of cancer, an annual clinical breast exam, and perform monthly breast self-exams.  FOLLOW-UP:  Cancer genetics is a rapidly advancing field and it is possible that new genetic tests will be appropriate for her and/or her family members in the future. We encouraged her to remain in contact with cancer genetics on an annual basis so we can update her personal and family histories and let her know of advances in cancer genetics that may benefit this family.   Our contact number was provided. Ms. Ausley questions were answered to her satisfaction, and she knows she is welcome to call us at anytime with additional questions or concerns.   Lucille Passy, MS, [facility] Genetic Counselor [email or web address] (P) [phone]

## 2022-10-19 ENCOUNTER — Encounter: Payer: Self-pay | Admitting: *Deleted

## 2022-10-20 ENCOUNTER — Telehealth: Payer: Self-pay | Admitting: Hematology and Oncology

## 2022-10-20 ENCOUNTER — Encounter: Payer: Self-pay | Admitting: *Deleted

## 2022-10-20 DIAGNOSIS — C50411 Malignant neoplasm of upper-outer quadrant of right female breast: Secondary | ICD-10-CM

## 2022-10-20 NOTE — Telephone Encounter (Signed)
Scheduled appointment per provider PAL. Patient is aware of the changes made to her upcoming appointment.  

## 2022-10-21 ENCOUNTER — Encounter (HOSPITAL_COMMUNITY): Payer: Self-pay

## 2022-10-21 ENCOUNTER — Encounter: Payer: Self-pay | Admitting: Hematology and Oncology

## 2022-10-21 ENCOUNTER — Telehealth: Payer: Self-pay

## 2022-10-21 ENCOUNTER — Inpatient Hospital Stay: Payer: BC Managed Care – PPO | Attending: Hematology and Oncology | Admitting: Hematology and Oncology

## 2022-10-21 ENCOUNTER — Other Ambulatory Visit: Payer: Self-pay

## 2022-10-21 VITALS — BP 119/81 | HR 73 | Temp 97.9°F | Resp 18 | Wt 153.2 lb

## 2022-10-21 DIAGNOSIS — Z170 Estrogen receptor positive status [ER+]: Secondary | ICD-10-CM | POA: Diagnosis not present

## 2022-10-21 DIAGNOSIS — C50411 Malignant neoplasm of upper-outer quadrant of right female breast: Secondary | ICD-10-CM | POA: Diagnosis present

## 2022-10-21 DIAGNOSIS — C773 Secondary and unspecified malignant neoplasm of axilla and upper limb lymph nodes: Secondary | ICD-10-CM | POA: Diagnosis not present

## 2022-10-21 DIAGNOSIS — Z79899 Other long term (current) drug therapy: Secondary | ICD-10-CM | POA: Insufficient documentation

## 2022-10-21 NOTE — Progress Notes (Signed)
Patient Care Team: Caryl Bis, MD as PCP - General (Unknown Physician Specialty) Mauro Kaufmann, RN as Oncology Nurse Navigator Rockwell Germany, RN as Oncology Nurse Navigator Nicholas Lose, MD as Consulting Physician (Hematology and Oncology)  DIAGNOSIS:  Encounter Diagnosis  Name Primary?   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female, estrogen receptor positive ([place]) Yes    SUMMARY OF ONCOLOGIC HISTORY: Oncology History  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female, estrogen receptor positive ([place])  09/15/2022 Initial Diagnosis   Screening mammogram detected right breast mass which measured 1.4 cm by ultrasound at 10 o'clock position, biopsy revealed grade 3 IDC with lymphovascular invasion ER 10%, PR 0%, Ki-67 30%, HER2 0 negative   09/25/2022 Cancer Staging   Staging form: Breast, AJCC 8th Edition - Clinical: Stage IB (cT1c, cN0, cM0, G3, ER+, PR-, HER2-) - Signed by Nicholas Lose, MD on 09/25/2022 Histologic grading system: 3 grade system    Genetic Testing   [facility] CancerNext-Expanded Panel+RNA was Negative. Report date is 10/06/2021.  The CancerNext-Expanded gene panel offered by [facility] and includes sequencing, rearrangement, and RNA analysis for the following 77 genes: AIP, ALK, APC, ATM, AXIN2, BAP1, BARD1, BLM, BMPR1A, BRCA1, BRCA2, BRIP1, CDC73, CDH1, CDK4, CDKN1B, CDKN2A, CHEK2, CTNNA1, DICER1, FANCC, FH, FLCN, GALNT12, KIF1B, LZTR1, MAX, MEN1, MET, MLH1, MSH2, MSH3, MSH6, MUTYH, NBN, NF1, NF2, NTHL1, PALB2, PHOX2B, PMS2, POT1, PRKAR1A, PTCH1, PTEN, RAD51C, RAD51D, RB1, RECQL, RET, SDHA, SDHAF2, SDHB, SDHC, SDHD, SMAD4, SMARCA4, SMARCB1, SMARCE1, STK11, SUFU, TMEM127, TP53, TSC1, TSC2, VHL and XRCC2 (sequencing and deletion/duplication); EGFR, EGLN1, HOXB13, KIT, MITF, PDGFRA, POLD1, and POLE (sequencing only); EPCAM and GREM1 (deletion/duplication only).    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 with focal lobular features with high-grade DCIS, inferior margin positive, angiolymphatic invasion present, 5/5 lymph nodes positive, additional medial margin: Poorly differentiated carcinoma with lymphoid stroma, additional inferior margin: Positive, ER 10% weak, PR 0%, HER2 0, Ki-67 30%   10/21/2022 Cancer Staging   Staging form: Breast, AJCC 8th Edition - Pathologic: Stage IIIC (pT1c, pN2a, cM0, G3, ER-, PR-, HER2-) - Signed by Nicholas Lose, MD on 10/21/2022 Stage prefix: Initial diagnosis Histologic grading system: 3 grade system   11/04/2022 -  Chemotherapy   Patient is on Treatment Plan : BREAST ADJUVANT DOSE DENSE AC q14d / PACLitaxel q7d       CHIEF COMPLIANT: Right breast cancer   INTERVAL HISTORY: Caitlin [name] is a 67 y.o. female is here because of recent diagnosis of right breast cancer. She presents to the clinic for a follow-up.    ALLERGIES:  is allergic to penicillins.  MEDICATIONS:  Current Outpatient Medications  Medication Sig Dispense Refill   acetaminophen (TYLENOL) 500 MG tablet Take 500 mg by mouth every 6 (six) hours as needed for moderate pain.     albuterol (VENTOLIN HFA) 108 (90 Base) MCG/ACT inhaler Inhale 2 puffs into the lungs every 4 (four) hours as needed for shortness of breath (Asthma).     aspirin 81 MG tablet Take 81 mg by mouth daily.       benzonatate (TESSALON) 200 MG capsule Take 200 mg by mouth daily as needed for cough.     conjugated estrogens (PREMARIN) vaginal cream Place 1 applicator vaginally daily as needed.     diltiazem (CARDIZEM CD) 120 MG 24 hr capsule Take 120 mg by mouth daily.     levothyroxine (SYNTHROID) 75  MCG tablet Take 75 mcg by mouth daily before breakfast.     Multiple Vitamins-Minerals (MULTIVITAMIN WITH MINERALS) tablet Take 1 tablet by mouth daily.     OVER THE COUNTER MEDICATION Take 1 capsule by mouth 2 (two) times daily. copaiba softgels     PULMICORT FLEXHALER  180 MCG/ACT inhaler Inhale 1 puff into the lungs daily.     tamsulosin (FLOMAX) 0.4 MG CAPS capsule Take 0.4 mg by mouth daily as needed (Kidney stones).     traMADol (ULTRAM) 50 MG tablet Take 1 tablet (50 mg total) by mouth every 6 (six) hours as needed. 10 tablet 0   triamcinolone cream (KENALOG) 0.1 % Apply 1 Application topically daily as needed (Dry skin).     No current facility-administered medications for this visit.    PHYSICAL EXAMINATION: ECOG PERFORMANCE STATUS: 1 - Symptomatic but completely ambulatory  Vitals:   10/21/22 1313  BP: 119/81  Pulse: 73  Resp: 18  Temp: 97.9 F (36.6 C)  SpO2: 98%   Filed Weights   10/21/22 1313  Weight: 153 lb 4 oz (69.5 kg)      LABORATORY DATA:  I have reviewed the data as listed    Latest Ref Rng & Units 10/13/2022    9:30 AM  CMP  Glucose 70 - 99 mg/dL 89   BUN 8 - 23 mg/dL 14   Creatinine 0.44 - 1.00 mg/dL 0.89   Sodium 135 - 145 mmol/L 137   Potassium 3.5 - 5.1 mmol/L 4.1   Chloride 98 - 111 mmol/L 102   CO2 22 - 32 mmol/L 25   Calcium 8.9 - 10.3 mg/dL 8.9     Lab Results  Component Value Date   WBC 11.1 (H) 10/13/2022   HGB 12.6 10/13/2022   HCT 39.7 10/13/2022   MCV 94.1 10/13/2022   PLT 350 10/13/2022    ASSESSMENT & PLAN:  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female, estrogen receptor positive ([place]) 10/14/2022:Right lumpectomy: Grade 3 invasive poorly differentiated ductal adenocarcinoma 1.4 cm with focal lobular features with high-grade DCIS, inferior margin positive, angiolymphatic invasion present, 5/5 lymph nodes positive, additional medial margin: Poorly differentiated carcinoma with lymphoid stroma, additional inferior margin: Positive, ER 10% weak, PR 0%, HER2 0, Ki-67 30% T1c N2a: Stage IIIc pathological staging  Treatment plan: CT CAP and bone scan If no evidence of metastatic disease then patient will require resection of the margins Adjuvant chemotherapy with dose dense  Adriamycin and Cytoxan followed by Taxol along with immunotherapy Adjuvant radiation  Chemo counseling: I discussed with the patient extensively about the risks and benefits of systemic chemotherapy  Chemotherapy Counseling: I discussed the risks and benefits of chemotherapy including the risks of nausea/ vomiting, risk of infection from low WBC count, fatigue due to chemo or anemia, bruising or bleeding due to low platelets, mouth sores, loss/ change in taste and decreased appetite. Liver and kidney function will be monitored through out chemotherapy as abnormalities in liver and kidney function may be a side effect of treatment. Cardiac dysfunction due to Adriamycin and neuropathy risk from Taxol were discussed in detail. Risk of permanent bone marrow dysfunction and leukemia due to chemo were also discussed.  Return to clinic based upon the CT scan and bone scan results   No orders of the defined types were placed in this encounter.  The patient has a good understanding of the overall plan. she agrees with it. she will call with any problems that may develop  before the next visit here. Total time spent: 30 mins including face to face time and time spent for planning, charting and co-ordination of care   Harriette Ohara, MD 10/21/22    I Gardiner Coins am acting as a Education administrator for [employer]  I have reviewed the above documentation for accuracy and completeness, and I agree with the above.

## 2022-10-21 NOTE — Progress Notes (Signed)
START ON PATHWAY REGIMEN - Breast     Cycles 1 through 4: A cycle is every 14 days:     Doxorubicin      Cyclophosphamide      [id number]-[id number]    Cycles 5 through 16: A cycle is every 7 days:     Paclitaxel   **Always confirm dose/schedule in your pharmacy ordering system**  Patient Characteristics: Postoperative without Neoadjuvant Therapy (Pathologic Staging), Invasive Disease, Adjuvant Therapy, HER2 Negative, ER Negative, Node Positive Therapeutic Status: Postoperative without Neoadjuvant Therapy (Pathologic Staging) AJCC Grade: G3 AJCC N Category: pN2a AJCC M Category: cM0 ER Status: Negative (-) AJCC 8 Stage Grouping: IIIC HER2 Status: Negative (-) Oncotype Dx Recurrence Score: Not Appropriate AJCC T Category: pT1c PR Status: Negative (-) Intent of Therapy: Curative Intent, Discussed with Patient

## 2022-10-21 NOTE — Progress Notes (Signed)
Patient Care Team: Caryl Bis, MD as PCP - General (Unknown Physician Specialty) Mauro Kaufmann, RN as Oncology Nurse Navigator Rockwell Germany, RN as Oncology Nurse Navigator Nicholas Lose, MD as Consulting Physician (Hematology and Oncology)  DIAGNOSIS:  Encounter Diagnosis  Name Primary?   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female, estrogen receptor positive ([place]) Yes    SUMMARY OF ONCOLOGIC HISTORY: Oncology History  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female, estrogen receptor positive ([place])  09/15/2022 Initial Diagnosis   Screening mammogram detected right breast mass which measured 1.4 cm by ultrasound at 10 o'clock position, biopsy revealed grade 3 IDC with lymphovascular invasion ER 10%, PR 0%, Ki-67 30%, HER2 0 negative   09/25/2022 Cancer Staging   Staging form: Breast, AJCC 8th Edition - Clinical: Stage IB (cT1c, cN0, cM0, G3, ER+, PR-, HER2-) - Signed by Nicholas Lose, MD on 09/25/2022 Histologic grading system: 3 grade system    Genetic Testing   [facility] CancerNext-Expanded Panel+RNA was Negative. Report date is 10/06/2021.  The CancerNext-Expanded gene panel offered by [facility] and includes sequencing, rearrangement, and RNA analysis for the following 77 genes: AIP, ALK, APC, ATM, AXIN2, BAP1, BARD1, BLM, BMPR1A, BRCA1, BRCA2, BRIP1, CDC73, CDH1, CDK4, CDKN1B, CDKN2A, CHEK2, CTNNA1, DICER1, FANCC, FH, FLCN, GALNT12, KIF1B, LZTR1, MAX, MEN1, MET, MLH1, MSH2, MSH3, MSH6, MUTYH, NBN, NF1, NF2, NTHL1, PALB2, PHOX2B, PMS2, POT1, PRKAR1A, PTCH1, PTEN, RAD51C, RAD51D, RB1, RECQL, RET, SDHA, SDHAF2, SDHB, SDHC, SDHD, SMAD4, SMARCA4, SMARCB1, SMARCE1, STK11, SUFU, TMEM127, TP53, TSC1, TSC2, VHL and XRCC2 (sequencing and deletion/duplication); EGFR, EGLN1, HOXB13, KIT, MITF, PDGFRA, POLD1, and POLE (sequencing only); EPCAM and GREM1 (deletion/duplication only).    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 with focal lobular features with high-grade DCIS, inferior margin positive, angiolymphatic invasion present, 5/5 lymph nodes positive, additional medial margin: Poorly differentiated carcinoma with lymphoid stroma, additional inferior margin: Positive, ER 10% weak, PR 0%, HER2 0, Ki-67 30%   10/21/2022 Cancer Staging   Staging form: Breast, AJCC 8th Edition - Pathologic: Stage IIIC (pT1c, pN2a, cM0, G3, ER-, PR-, HER2-) - Signed by Nicholas Lose, MD on 10/21/2022 Stage prefix: Initial diagnosis Histologic grading system: 3 grade system     CHIEF COMPLIANT: Follow-up to discuss results of surgery.  INTERVAL HISTORY: Caitlin [name] is a 67 year old with above-mentioned history of right breast cancer treated with lumpectomy who was noted to have 5 positive lymph nodes final pathology came back as poorly differential carcinoma that is functional triple negative.  She is here today to discuss the results of the surgery and to come up with the treatment plan.   ALLERGIES:  is allergic to penicillins.  MEDICATIONS:  Current Outpatient Medications  Medication Sig Dispense Refill   acetaminophen (TYLENOL) 500 MG tablet Take 500 mg by mouth every 6 (six) hours as needed for moderate pain.     albuterol (VENTOLIN HFA) 108 (90 Base) MCG/ACT inhaler Inhale 2 puffs into the lungs every 4 (four) hours as needed for shortness of breath (Asthma).     aspirin 81 MG tablet Take 81 mg by mouth daily.       benzonatate (TESSALON) 200 MG capsule Take 200 mg by mouth daily as needed for cough.     conjugated estrogens (PREMARIN) vaginal cream Place 1 applicator vaginally daily as needed.     diltiazem (CARDIZEM CD) 120 MG 24 hr capsule Take 120 mg by mouth  daily.     levothyroxine (SYNTHROID) 75 MCG tablet Take 75 mcg by mouth daily before breakfast.     Multiple Vitamins-Minerals (MULTIVITAMIN WITH MINERALS) tablet Take 1 tablet by mouth daily.     OVER THE  COUNTER MEDICATION Take 1 capsule by mouth 2 (two) times daily. copaiba softgels     PULMICORT FLEXHALER 180 MCG/ACT inhaler Inhale 1 puff into the lungs daily.     tamsulosin (FLOMAX) 0.4 MG CAPS capsule Take 0.4 mg by mouth daily as needed (Kidney stones).     traMADol (ULTRAM) 50 MG tablet Take 1 tablet (50 mg total) by mouth every 6 (six) hours as needed. 10 tablet 0   triamcinolone cream (KENALOG) 0.1 % Apply 1 Application topically daily as needed (Dry skin).     No current facility-administered medications for this visit.    PHYSICAL EXAMINATION: ECOG PERFORMANCE STATUS: 1 - Symptomatic but completely ambulatory  Vitals:   10/21/22 1313  BP: 119/81  Pulse: 73  Resp: 18  Temp: 97.9 F (36.6 C)  SpO2: 98%   Filed Weights   10/21/22 1313  Weight: 153 lb 4 oz (69.5 kg)     LABORATORY DATA:  I have reviewed the data as listed    Latest Ref Rng & Units 10/13/2022    9:30 AM  CMP  Glucose 70 - 99 mg/dL 89   BUN 8 - 23 mg/dL 14   Creatinine 0.44 - 1.00 mg/dL 0.89   Sodium 135 - 145 mmol/L 137   Potassium 3.5 - 5.1 mmol/L 4.1   Chloride 98 - 111 mmol/L 102   CO2 22 - 32 mmol/L 25   Calcium 8.9 - 10.3 mg/dL 8.9     Lab Results  Component Value Date   WBC 11.1 (H) 10/13/2022   HGB 12.6 10/13/2022   HCT 39.7 10/13/2022   MCV 94.1 10/13/2022   PLT 350 10/13/2022    ASSESSMENT & PLAN:  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female, estrogen receptor positive ([place]) 10/14/2022:Right lumpectomy: Grade 3 invasive poorly differentiated ductal adenocarcinoma 1.4 cm with focal lobular features with high-grade DCIS, inferior margin positive, angiolymphatic invasion present, 5/5 lymph nodes positive, additional medial margin: Poorly differentiated carcinoma with lymphoid stroma, additional inferior margin: Positive, ER 10% weak, PR 0%, HER2 0, Ki-67 30% T1c N2a: Stage IIIc pathological staging  Treatment plan: CT CAP and bone scan If no evidence of  metastatic disease then patient will require resection of the margins Adjuvant chemotherapy with dose dense Adriamycin and Cytoxan followed by Taxol along with immunotherapy Adjuvant radiation  Chemo counseling: I discussed with the patient extensively about the risks and benefits of systemic chemotherapy Return to clinic based upon the CT scan and bone scan results   No orders of the defined types were placed in this encounter.  The patient has a good understanding of the overall plan. she agrees with it. she will call with any problems that may develop before the next visit here. Total time spent: 30 mins including face to face time and time spent for planning, charting and co-ordination of care   Harriette Ohara, MD 10/21/22

## 2022-10-21 NOTE — Assessment & Plan Note (Signed)
10/14/2022:Right lumpectomy: Grade 3 invasive poorly differentiated ductal adenocarcinoma 1.4 cm with focal lobular features with high-grade DCIS, inferior margin positive, angiolymphatic invasion present, 5/5 lymph nodes positive, additional medial margin: Poorly differentiated carcinoma with lymphoid stroma, additional inferior margin: Positive, ER 10% weak, PR 0%, HER2 0, Ki-67 30% T1c N2a: Stage IIIc pathological staging  Treatment plan: CT CAP and bone scan If no evidence of metastatic disease then patient will require resection of the margins Adjuvant chemotherapy with dose dense Adriamycin and Cytoxan followed by Taxol along with immunotherapy Adjuvant radiation  Chemo counseling: I discussed with the patient extensively about the risks and benefits of systemic chemotherapy Return to clinic based upon the CT scan and bone scan results

## 2022-10-21 NOTE — Telephone Encounter (Signed)
Called Pt to discuss scan appts. Rescheduled bone scan and CTs for sooner appts per Lindi Adie, MD. Made MD follow up appt to go over scan results. Pt verbalized understanding.

## 2022-10-22 ENCOUNTER — Other Ambulatory Visit: Payer: Self-pay

## 2022-10-22 LAB — SURGICAL PATHOLOGY

## 2022-10-25 ENCOUNTER — Encounter: Payer: Self-pay | Admitting: *Deleted

## 2022-10-29 ENCOUNTER — Ambulatory Visit: Payer: BC Managed Care – PPO | Admitting: Hematology and Oncology

## 2022-10-29 ENCOUNTER — Encounter (HOSPITAL_COMMUNITY)
Admission: RE | Admit: 2022-10-29 | Discharge: 2022-10-29 | Disposition: A | Discharge status: Home / Self Care | Payer: BC Managed Care – PPO | Source: Ambulatory Visit | Attending: Hematology and Oncology | Admitting: Hematology and Oncology

## 2022-10-29 ENCOUNTER — Ambulatory Visit (HOSPITAL_COMMUNITY)
Admission: RE | Admit: 2022-10-29 | Discharge: 2022-10-29 | Disposition: A | Discharge status: Home / Self Care | Payer: BC Managed Care – PPO | Source: Ambulatory Visit | Attending: Hematology and Oncology | Admitting: Hematology and Oncology

## 2022-10-29 ENCOUNTER — Encounter (HOSPITAL_COMMUNITY): Payer: Self-pay

## 2022-10-29 DIAGNOSIS — Z170 Estrogen receptor positive status [ER+]: Secondary | ICD-10-CM | POA: Insufficient documentation

## 2022-10-29 DIAGNOSIS — C50411 Malignant neoplasm of upper-outer quadrant of right female breast: Secondary | ICD-10-CM | POA: Insufficient documentation

## 2022-10-29 MED ORDER — SODIUM CHLORIDE (PF) 0.9 % IJ SOLN
INTRAMUSCULAR | Status: AC
  Filled 2022-10-29: qty 50

## 2022-10-29 MED ORDER — TECHNETIUM TC 99M MEDRONATE IV KIT
20.1000 | PACK | Freq: Once | INTRAVENOUS | Status: AC
  Administered 2022-10-29: 20.1 via INTRAVENOUS

## 2022-10-29 MED ORDER — HEPARIN SOD (PORK) LOCK FLUSH 100 UNIT/ML IV SOLN
INTRAVENOUS | Status: AC
  Filled 2022-10-29: qty 5

## 2022-10-29 MED ORDER — IOHEXOL 300 MG/ML  SOLN
100.0000 mL | Freq: Once | INTRAMUSCULAR | Status: AC | PRN
  Administered 2022-10-29: 100 mL via INTRAVENOUS

## 2022-10-29 MED ORDER — HEPARIN SOD (PORK) LOCK FLUSH 100 UNIT/ML IV SOLN
500.0000 [IU] | Freq: Once | INTRAVENOUS | Status: AC
  Administered 2022-10-29: 500 [IU] via INTRAVENOUS

## 2022-11-01 ENCOUNTER — Encounter: Payer: Self-pay | Admitting: *Deleted

## 2022-11-01 ENCOUNTER — Encounter: Payer: Self-pay | Admitting: Hematology and Oncology

## 2022-11-02 ENCOUNTER — Other Ambulatory Visit (HOSPITAL_COMMUNITY): Payer: BC Managed Care – PPO

## 2022-11-03 ENCOUNTER — Encounter: Payer: Self-pay | Admitting: *Deleted

## 2022-11-03 ENCOUNTER — Ambulatory Visit (HOSPITAL_COMMUNITY): Payer: BC Managed Care – PPO

## 2022-11-03 ENCOUNTER — Other Ambulatory Visit: Payer: Self-pay

## 2022-11-03 ENCOUNTER — Other Ambulatory Visit: Payer: Self-pay | Admitting: General Surgery

## 2022-11-03 ENCOUNTER — Encounter (HOSPITAL_BASED_OUTPATIENT_CLINIC_OR_DEPARTMENT_OTHER): Payer: Self-pay | Admitting: General Surgery

## 2022-11-03 NOTE — Assessment & Plan Note (Signed)
10/14/2022:Right lumpectomy: Grade 3 invasive poorly differentiated ductal adenocarcinoma 1.4 cm with focal lobular features with high-grade DCIS, inferior margin positive, angiolymphatic invasion present, 5/5 lymph nodes positive, additional medial margin: Poorly differentiated carcinoma with lymphoid stroma, additional inferior margin: Positive, ER 10% weak, PR 0%, HER2 0, Ki-67 30% T1c N2a: Stage IIIc pathological staging   Treatment plan: CT CAP 10/29/2022: Several prominent lymph nodes and enlarged nodes right axilla and right supraclavicular region many of these are nonpathologic by size criteria.  No distant metastatic disease. Bone scan 10/29/2022: No evidence of bone metastasis Surgery: Resection of margins and ALND Adjuvant chemotherapy with dose dense Adriamycin and Cytoxan followed by Taxol along with immunotherapy Adjuvant radiation  Return to clinic to start chemotherapy after surgery

## 2022-11-04 ENCOUNTER — Encounter: Payer: Self-pay | Admitting: Physical Therapy

## 2022-11-04 ENCOUNTER — Telehealth: Payer: Self-pay | Admitting: Hematology and Oncology

## 2022-11-04 ENCOUNTER — Encounter: Payer: Self-pay | Admitting: *Deleted

## 2022-11-04 ENCOUNTER — Encounter: Payer: Self-pay | Admitting: Hematology and Oncology

## 2022-11-04 ENCOUNTER — Telehealth: Payer: Self-pay

## 2022-11-04 ENCOUNTER — Inpatient Hospital Stay: Payer: BC Managed Care – PPO | Attending: Hematology and Oncology | Admitting: Hematology and Oncology

## 2022-11-04 VITALS — BP 121/82 | HR 70 | Temp 97.7°F | Resp 18 | Wt 154.5 lb

## 2022-11-04 DIAGNOSIS — Z5111 Encounter for antineoplastic chemotherapy: Secondary | ICD-10-CM | POA: Diagnosis present

## 2022-11-04 DIAGNOSIS — Z79899 Other long term (current) drug therapy: Secondary | ICD-10-CM | POA: Insufficient documentation

## 2022-11-04 DIAGNOSIS — Z170 Estrogen receptor positive status [ER+]: Secondary | ICD-10-CM | POA: Insufficient documentation

## 2022-11-04 DIAGNOSIS — I427 Cardiomyopathy due to drug and external agent: Secondary | ICD-10-CM

## 2022-11-04 DIAGNOSIS — C50411 Malignant neoplasm of upper-outer quadrant of right female breast: Secondary | ICD-10-CM | POA: Diagnosis present

## 2022-11-04 MED ORDER — ONDANSETRON HCL 8 MG PO TABS: ORAL_TABLET | ORAL | 1 refills | Status: DC

## 2022-11-04 MED ORDER — DEXAMETHASONE 4 MG PO TABS: ORAL_TABLET | ORAL | 1 refills | Status: DC

## 2022-11-04 MED ORDER — LIDOCAINE-PRILOCAINE 2.5-2.5 % EX CREA: TOPICAL_CREAM | CUTANEOUS | 3 refills | Status: DC

## 2022-11-04 MED ORDER — PROCHLORPERAZINE MALEATE 10 MG PO TABS: 10.0000 mg | ORAL_TABLET | Freq: Four times a day (QID) | ORAL | 1 refills | Status: DC | PRN

## 2022-11-04 NOTE — Telephone Encounter (Signed)
Notified Patient of prior authorization approval for Lidocaine-Prilocaine 2.5% Cream. Medication is authorized through 02/01/2023. Patient inquired regarding scheduling appointments for chemo education class, treatments, and EKG. Provided Patient with telephone number for Scheduler for Dr. Lindi Adie. No other needs or concerns voiced at this time.

## 2022-11-04 NOTE — Telephone Encounter (Signed)
Scheduled appointments per WQ. Patient is aware.

## 2022-11-04 NOTE — Progress Notes (Signed)
Patient Care Team: Caryl Bis, MD as PCP - General (Unknown Physician Specialty) Mauro Kaufmann, RN as Oncology Nurse Navigator Rockwell Germany, RN as Oncology Nurse Navigator Nicholas Lose, MD as Consulting Physician (Hematology and Oncology)  DIAGNOSIS:  Encounter Diagnoses  Name Primary?   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female, estrogen receptor positive ([place]) Yes   Cardiotoxicity ([place])     SUMMARY OF ONCOLOGIC HISTORY: Oncology History  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female, estrogen receptor positive ([place])  09/15/2022 Initial Diagnosis   Screening mammogram detected right breast mass which measured 1.4 cm by ultrasound at 10 o'clock position, biopsy revealed grade 3 IDC with lymphovascular invasion ER 10%, PR 0%, Ki-67 30%, HER2 0 negative   09/25/2022 Cancer Staging   Staging form: Breast, AJCC 8th Edition - Clinical: Stage IB (cT1c, cN0, cM0, G3, ER+, PR-, HER2-) - Signed by Nicholas Lose, MD on 09/25/2022 Histologic grading system: 3 grade system    Genetic Testing   [facility] CancerNext-Expanded Panel+RNA was Negative. Report date is 10/06/2021.  The CancerNext-Expanded gene panel offered by [facility] and includes sequencing, rearrangement, and RNA analysis for the following 77 genes: AIP, ALK, APC, ATM, AXIN2, BAP1, BARD1, BLM, BMPR1A, BRCA1, BRCA2, BRIP1, CDC73, CDH1, CDK4, CDKN1B, CDKN2A, CHEK2, CTNNA1, DICER1, FANCC, FH, FLCN, GALNT12, KIF1B, LZTR1, MAX, MEN1, MET, MLH1, MSH2, MSH3, MSH6, MUTYH, NBN, NF1, NF2, NTHL1, PALB2, PHOX2B, PMS2, POT1, PRKAR1A, PTCH1, PTEN, RAD51C, RAD51D, RB1, RECQL, RET, SDHA, SDHAF2, SDHB, SDHC, SDHD, SMAD4, SMARCA4, SMARCB1, SMARCE1, STK11, SUFU, TMEM127, TP53, TSC1, TSC2, VHL and XRCC2 (sequencing and deletion/duplication); EGFR, EGLN1, HOXB13, KIT, MITF, PDGFRA, POLD1, and POLE (sequencing only); EPCAM and GREM1 (deletion/duplication only).    10/14/2022 Surgery   Right  lumpectomy: Grade 3 invasive poorly differentiated ductal adenocarcinoma 1.4 cm with focal lobular features with high-grade DCIS, inferior margin positive, angiolymphatic invasion present, 5/5 lymph nodes positive, additional medial margin: Poorly differentiated carcinoma with lymphoid stroma, additional inferior margin: Positive, ER 10% weak, PR 0%, HER2 0, Ki-67 30%   10/21/2022 Cancer Staging   Staging form: Breast, AJCC 8th Edition - Pathologic: Stage IIIC (pT1c, pN2a, cM0, G3, ER-, PR-, HER2-) - Signed by Nicholas Lose, MD on 10/21/2022 Stage prefix: Initial diagnosis Histologic grading system: 3 grade system   11/22/2022 -  Chemotherapy   Patient is on Treatment Plan : BREAST ADJUVANT DOSE DENSE AC q14d / PACLitaxel q7d       CHIEF COMPLIANT: Review scans  INTERVAL HISTORY: Caitlin [name] is a 67 y.o. female is here because of recent diagnosis of right breast cancer. She presents to the clinic for a follow-up to review scans and discuss treatment.  She is scheduled to undergo resection for the margins on Monday.  She is here today to decide on the date for her start of her chemotherapy.   ALLERGIES:  is allergic to penicillins.  MEDICATIONS:  Current Outpatient Medications  Medication Sig Dispense Refill   acetaminophen (TYLENOL) 500 MG tablet Take 500 mg by mouth every 6 (six) hours as needed for moderate pain.     albuterol (VENTOLIN HFA) 108 (90 Base) MCG/ACT inhaler Inhale 2 puffs into the lungs every 4 (four) hours as needed for shortness of breath (Asthma).     aspirin 81 MG tablet Take 81 mg by mouth daily.       benzonatate (TESSALON) 200 MG capsule Take 200 mg by mouth daily as needed for cough.     conjugated  estrogens (PREMARIN) vaginal cream Place 1 applicator vaginally daily as needed.     diltiazem (CARDIZEM CD) 120 MG 24 hr capsule Take 120 mg by mouth daily.     levothyroxine (SYNTHROID) 75 MCG tablet Take 75 mcg by mouth daily before breakfast.     Multiple  Vitamins-Minerals (MULTIVITAMIN WITH MINERALS) tablet Take 1 tablet by mouth daily.     OVER THE COUNTER MEDICATION Take 1 capsule by mouth 2 (two) times daily. copaiba softgels     PULMICORT FLEXHALER 180 MCG/ACT inhaler Inhale 1 puff into the lungs daily.     tamsulosin (FLOMAX) 0.4 MG CAPS capsule Take 0.4 mg by mouth daily as needed (Kidney stones).     traMADol (ULTRAM) 50 MG tablet Take 1 tablet (50 mg total) by mouth every 6 (six) hours as needed. 10 tablet 0   triamcinolone cream (KENALOG) 0.1 % Apply 1 Application topically daily as needed (Dry skin).     dexamethasone (DECADRON) 4 MG tablet Take 2 tablets (8 mg total) by mouth daily for 3 days. Start the day after doxorubicin/cyclophosphamide chemotherapy. Take with food. 30 tablet 1   lidocaine-prilocaine (EMLA) cream Apply to affected area once 30 g 3   ondansetron (ZOFRAN) 8 MG tablet Take 1 tab (8 mg) by mouth every 8 hrs as needed for nausea/vomiting. Start third day after doxorubicin/cyclophosphamide chemotherapy. 30 tablet 1   prochlorperazine (COMPAZINE) 10 MG tablet Take 1 tablet (10 mg total) by mouth every 6 (six) hours as needed for nausea or vomiting. 30 tablet 1   No current facility-administered medications for this visit.    PHYSICAL EXAMINATION: ECOG PERFORMANCE STATUS: 1 - Symptomatic but completely ambulatory  Vitals:   11/04/22 0757  BP: 121/82  Pulse: 70  Resp: 18  Temp: 97.7 F (36.5 C)  SpO2: 98%   Filed Weights   11/04/22 0757  Weight: 154 lb 8 oz (70.1 kg)      LABORATORY DATA:  I have reviewed the data as listed    Latest Ref Rng & Units 10/13/2022    9:30 AM  CMP  Glucose 70 - 99 mg/dL 89   BUN 8 - 23 mg/dL 14   Creatinine 0.44 - 1.00 mg/dL 0.89   Sodium 135 - 145 mmol/L 137   Potassium 3.5 - 5.1 mmol/L 4.1   Chloride 98 - 111 mmol/L 102   CO2 22 - 32 mmol/L 25   Calcium 8.9 - 10.3 mg/dL 8.9     Lab Results  Component Value Date   WBC 11.1 (H) 10/13/2022   HGB 12.6 10/13/2022    HCT 39.7 10/13/2022   MCV 94.1 10/13/2022   PLT 350 10/13/2022    ASSESSMENT & PLAN:  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female, estrogen receptor positive ([place]) 10/14/2022:Right lumpectomy: Grade 3 invasive poorly differentiated ductal adenocarcinoma 1.4 cm with focal lobular features with high-grade DCIS, inferior margin positive, angiolymphatic invasion present, 5/5 lymph nodes positive, additional medial margin: Poorly differentiated carcinoma with lymphoid stroma, additional inferior margin: Positive, ER 10% weak, PR 0%, HER2 0, Ki-67 30% T1c N2a: Stage IIIc pathological staging   Treatment plan: CT CAP 10/29/2022: Several prominent lymph nodes and enlarged nodes right axilla and right supraclavicular region many of these are nonpathologic by size criteria.  No distant metastatic disease. Bone scan 10/29/2022: No evidence of bone metastasis Surgery: Resection of margins and ALND Adjuvant chemotherapy with dose dense Adriamycin and Cytoxan followed by Taxol  Adjuvant radiation  Radiology counseling: I  discussed with the patient that there were several prominent lymph nodes in the axilla supraclavicular and pectoral region.  These nodes cannot all be removed and systemic treatment and radiation should be able to help with getting rid of any residual disease.  I discussed the pros and cons of adding immunotherapy to her treatment but we decided against it because of lack of adjuvant data without being given in the neoadjuvant setting. Return to clinic to start chemotherapy after surgery on 11/22/2022   Orders Placed This Encounter  Procedures   Consent Attestation for Oncology Treatment    Order Specific Question:   The patient is informed of risks, benefits, side-effects of the prescribed oncology treatment. Potential short term and long term side effects and response rates discussed. After a long discussion, the patient made informed decision to proceed.     Answer:   Yes   CBC with Differential ([place] Only)    Standing Status:   Future    Standing Expiration Date:   11/23/2023   CMP ([place] only)    Standing Status:   Future    Standing Expiration Date:   11/23/2023   CBC with Differential ([place] Only)    Standing Status:   Future    Standing Expiration Date:   12/07/2023   CMP ([place] only)    Standing Status:   Future    Standing Expiration Date:   12/07/2023   CBC with Differential ([place] Only)    Standing Status:   Future    Standing Expiration Date:   12/21/2023   CMP ([place] only)    Standing Status:   Future    Standing Expiration Date:   12/21/2023   CBC with Differential ([place] Only)    Standing Status:   Future    Standing Expiration Date:   01/04/2024   CMP ([place] only)    Standing Status:   Future    Standing Expiration Date:   01/04/2024   CBC with Differential ([place] Only)    Standing Status:   Future    Standing Expiration Date:   01/18/2024   CMP ([place] only)    Standing Status:   Future    Standing Expiration Date:   01/18/2024   CBC with Differential ([facility])    Standing Status:   Future    Standing Expiration Date:   01/25/2024   CMP ([place] only)    Standing Status:   Future    Standing Expiration Date:   01/25/2024   CBC with Differential ([facility] Only)    Standing Status:   Future    Standing Expiration Date:   02/01/2024   CMP ([place] only)    Standing Status:   Future    Standing Expiration Date:   02/01/2024   PHYSICIAN COMMUNICATION ORDER    A baseline Echo/ [place] should be obtained prior to initiation of Anthracycline Chemotherapy   ECHOCARDIOGRAM COMPLETE    Standing Status:   Future    Standing Expiration Date:   11/05/2023    Order Specific Question:   Where should this test be performed    Answer:   [facility]    Order Specific Question:   Perflutren DEFINITY (image enhancing agent) should be  administered unless hypersensitivity or allergy exist    Answer:   Administer Perflutren    Order Specific Question:   Reason for exam-Echo    Answer:   Chemo  Z09  The patient has a good understanding of the overall plan. she agrees with it. she will call with any problems that may develop before the next visit here. Total time spent: 30 mins including face to face time and time spent for planning, charting and co-ordination of care   Harriette Ohara, MD 11/04/22    I Gardiner Coins am acting as a Education administrator for [employer]  I have reviewed the above documentation for accuracy and completeness, and I agree with the above.

## 2022-11-05 ENCOUNTER — Ambulatory Visit: Payer: BC Managed Care – PPO | Admitting: Hematology and Oncology

## 2022-11-07 NOTE — Anesthesia Preprocedure Evaluation (Signed)
Anesthesia Evaluation  Patient identified by MRN, date of birth, ID band Patient awake    Reviewed: Allergy & Precautions, NPO status , [name] Chart, lab work & pertinent test results  Airway Mallampati: II  TM Distance: >3 FB Neck ROM: Full    Dental no notable dental hx. (+) Teeth Intact, Dental Advisory Given   Pulmonary asthma    Pulmonary exam normal breath sounds clear to auscultation       Cardiovascular hypertension, Normal cardiovascular exam+ dysrhythmias Atrial Fibrillation  Rhythm:Regular Rate:Normal     Neuro/Psych negative neurological ROS  negative psych ROS   GI/Hepatic   Endo/Other  Hypothyroidism    Renal/GU negative Renal ROS     Musculoskeletal   Abdominal   Peds  Hematology Lab Results      Component                Value               Date                      WBC                      11.1 (H)            10/13/2022                HGB                      12.6                10/13/2022                HCT                      39.7                10/13/2022                MCV                      94.1                10/13/2022                PLT                      350                 10/13/2022              Anesthesia Other Findings All: PCN Breast CA  Reproductive/Obstetrics                             Anesthesia Physical Anesthesia Plan  ASA: 3  Anesthesia Plan: General   Post-op Pain Management: Tylenol PO (pre-op)* and Precedex   Induction: Intravenous  PONV Risk Score and Plan: Propofol infusion, TIVA, Treatment may vary due to age or medical condition and Ondansetron  Airway Management Planned: LMA  Additional Equipment: None  Intra-op Plan:   Post-operative Plan:   Informed Consent: I have reviewed the patients History and Physical, chart, labs and discussed the procedure including the risks, benefits and alternatives for the proposed  anesthesia with the patient or authorized representative who has indicated his/her understanding and acceptance.  Dental advisory given  Plan Discussed with:   Anesthesia Plan Comments: (LMA TIVA GA)       Anesthesia Quick Evaluation

## 2022-11-08 ENCOUNTER — Other Ambulatory Visit: Payer: Self-pay

## 2022-11-08 ENCOUNTER — Ambulatory Visit: Payer: BC Managed Care – PPO | Admitting: Physical Therapy

## 2022-11-08 ENCOUNTER — Ambulatory Visit (HOSPITAL_BASED_OUTPATIENT_CLINIC_OR_DEPARTMENT_OTHER): Payer: BC Managed Care – PPO | Admitting: Anesthesiology

## 2022-11-08 ENCOUNTER — Ambulatory Visit (HOSPITAL_BASED_OUTPATIENT_CLINIC_OR_DEPARTMENT_OTHER)
Admission: RE | Admit: 2022-11-08 | Discharge: 2022-11-08 | Disposition: A | Discharge status: Home / Self Care | Payer: BC Managed Care – PPO | Attending: General Surgery | Admitting: General Surgery

## 2022-11-08 ENCOUNTER — Encounter (HOSPITAL_BASED_OUTPATIENT_CLINIC_OR_DEPARTMENT_OTHER): Payer: Self-pay | Admitting: General Surgery

## 2022-11-08 ENCOUNTER — Encounter (HOSPITAL_BASED_OUTPATIENT_CLINIC_OR_DEPARTMENT_OTHER)
Admission: RE | Disposition: A | Discharge status: Home / Self Care | Payer: Self-pay | Source: Home / Self Care | Attending: General Surgery

## 2022-11-08 DIAGNOSIS — I10 Essential (primary) hypertension: Secondary | ICD-10-CM | POA: Insufficient documentation

## 2022-11-08 DIAGNOSIS — J45909 Unspecified asthma, uncomplicated: Secondary | ICD-10-CM | POA: Insufficient documentation

## 2022-11-08 DIAGNOSIS — C50411 Malignant neoplasm of upper-outer quadrant of right female breast: Secondary | ICD-10-CM | POA: Insufficient documentation

## 2022-11-08 DIAGNOSIS — Z8042 Family history of malignant neoplasm of prostate: Secondary | ICD-10-CM | POA: Insufficient documentation

## 2022-11-08 DIAGNOSIS — E039 Hypothyroidism, unspecified: Secondary | ICD-10-CM | POA: Diagnosis not present

## 2022-11-08 DIAGNOSIS — I4891 Unspecified atrial fibrillation: Secondary | ICD-10-CM | POA: Insufficient documentation

## 2022-11-08 DIAGNOSIS — Z01818 Encounter for other preprocedural examination: Secondary | ICD-10-CM

## 2022-11-08 DIAGNOSIS — Z170 Estrogen receptor positive status [ER+]: Secondary | ICD-10-CM | POA: Insufficient documentation

## 2022-11-08 HISTORY — PX: RE-EXCISION OF BREAST LUMPECTOMY: SHX6048

## 2022-11-08 SURGERY — EXCISION, LESION, BREAST
Anesthesia: General | Site: Breast | Laterality: Right

## 2022-11-08 MED ORDER — ENSURE PRE-SURGERY PO LIQD: 296.0000 mL | Freq: Once | ORAL | Status: DC

## 2022-11-08 MED ORDER — BUPIVACAINE HCL (PF) 0.25 % IJ SOLN
INTRAMUSCULAR | Status: AC
  Filled 2022-11-08: qty 30

## 2022-11-08 MED ORDER — CHLORHEXIDINE GLUCONATE CLOTH 2 % EX PADS: 6.0000 | MEDICATED_PAD | Freq: Once | CUTANEOUS | Status: DC

## 2022-11-08 MED ORDER — LIDOCAINE HCL (PF) 1 % IJ SOLN
INTRAMUSCULAR | Status: AC
  Filled 2022-11-08: qty 30

## 2022-11-08 MED ORDER — ONDANSETRON HCL 4 MG/2ML IJ SOLN: 4.0000 mg | Freq: Once | INTRAMUSCULAR | Status: DC | PRN

## 2022-11-08 MED ORDER — DEXAMETHASONE SODIUM PHOSPHATE 4 MG/ML IJ SOLN
INTRAMUSCULAR | Status: DC | PRN
  Administered 2022-11-08: 4 mg via INTRAVENOUS

## 2022-11-08 MED ORDER — PROPOFOL 500 MG/50ML IV EMUL
INTRAVENOUS | Status: DC | PRN
  Administered 2022-11-08: 150 ug/kg/min via INTRAVENOUS

## 2022-11-08 MED ORDER — MIDAZOLAM HCL 5 MG/5ML IJ SOLN
INTRAMUSCULAR | Status: DC | PRN
  Administered 2022-11-08: 2 mg via INTRAVENOUS

## 2022-11-08 MED ORDER — FENTANYL CITRATE (PF) 100 MCG/2ML IJ SOLN
INTRAMUSCULAR | Status: DC | PRN
  Administered 2022-11-08 (×2): 50 ug via INTRAVENOUS

## 2022-11-08 MED ORDER — ACETAMINOPHEN 10 MG/ML IV SOLN: 1000.0000 mg | Freq: Once | INTRAVENOUS | Status: DC | PRN

## 2022-11-08 MED ORDER — LACTATED RINGERS IV SOLN: INTRAVENOUS | Status: DC

## 2022-11-08 MED ORDER — FENTANYL CITRATE (PF) 100 MCG/2ML IJ SOLN: 25.0000 ug | INTRAMUSCULAR | Status: DC | PRN

## 2022-11-08 MED ORDER — ACETAMINOPHEN 500 MG PO TABS
1000.0000 mg | ORAL_TABLET | ORAL | Status: AC
  Administered 2022-11-08: 1000 mg via ORAL

## 2022-11-08 MED ORDER — BUPIVACAINE HCL (PF) 0.25 % IJ SOLN
INTRAMUSCULAR | Status: DC | PRN
  Administered 2022-11-08: 10 mL

## 2022-11-08 MED ORDER — ONDANSETRON HCL 4 MG/2ML IJ SOLN
INTRAMUSCULAR | Status: DC | PRN
  Administered 2022-11-08: 4 mg via INTRAVENOUS

## 2022-11-08 MED ORDER — LIDOCAINE HCL (CARDIAC) PF 100 MG/5ML IV SOSY
PREFILLED_SYRINGE | INTRAVENOUS | Status: DC | PRN
  Administered 2022-11-08: 60 mg via INTRAVENOUS

## 2022-11-08 MED ORDER — CIPROFLOXACIN IN D5W 400 MG/200ML IV SOLN
400.0000 mg | INTRAVENOUS | Status: AC
  Administered 2022-11-08: 400 mg via INTRAVENOUS

## 2022-11-08 MED ORDER — ACETAMINOPHEN 500 MG PO TABS
ORAL_TABLET | ORAL | Status: AC
  Filled 2022-11-08: qty 2

## 2022-11-08 MED ORDER — PROPOFOL 10 MG/ML IV BOLUS
INTRAVENOUS | Status: DC | PRN
  Administered 2022-11-08: 150 mg via INTRAVENOUS

## 2022-11-08 MED ORDER — CIPROFLOXACIN IN D5W 400 MG/200ML IV SOLN
INTRAVENOUS | Status: AC
  Filled 2022-11-08: qty 200

## 2022-11-08 MED ORDER — FENTANYL CITRATE (PF) 100 MCG/2ML IJ SOLN
INTRAMUSCULAR | Status: AC
  Filled 2022-11-08: qty 2

## 2022-11-08 MED ORDER — MIDAZOLAM HCL 2 MG/2ML IJ SOLN
INTRAMUSCULAR | Status: AC
  Filled 2022-11-08: qty 2

## 2022-11-08 SURGICAL SUPPLY — 51 items
ADH SKN CLS APL DERMABOND .7 (GAUZE/BANDAGES/DRESSINGS)
APL PRP STRL LF DISP 70% ISPRP (MISCELLANEOUS) ×1
BINDER BREAST LRG (GAUZE/BANDAGES/DRESSINGS) IMPLANT
BINDER BREAST MEDIUM (GAUZE/BANDAGES/DRESSINGS) IMPLANT
BINDER BREAST XLRG (GAUZE/BANDAGES/DRESSINGS) IMPLANT
BINDER BREAST XXLRG (GAUZE/BANDAGES/DRESSINGS) IMPLANT
BLADE SURG 15 STRL LF DISP TIS (BLADE) ×1 IMPLANT
BLADE SURG 15 STRL SS (BLADE) ×1
CANISTER SUCT 1200ML W/VALVE (MISCELLANEOUS) ×1 IMPLANT
CHLORAPREP W/TINT 26 (MISCELLANEOUS) ×1 IMPLANT
CLIP TI WIDE RED SMALL 6 (CLIP) IMPLANT
COVER BACK TABLE 60X90IN (DRAPES) ×1 IMPLANT
COVER MAYO STAND STRL (DRAPES) ×1 IMPLANT
DERMABOND ADVANCED .7 DNX12 (GAUZE/BANDAGES/DRESSINGS) IMPLANT
DRAPE LAPAROSCOPIC ABDOMINAL (DRAPES) ×1 IMPLANT
DRAPE UTILITY XL STRL (DRAPES) ×1 IMPLANT
DRSG TEGADERM 4X4.75 (GAUZE/BANDAGES/DRESSINGS) ×1 IMPLANT
ELECT COATED BLADE 2.86 ST (ELECTRODE) ×1 IMPLANT
ELECT REM PT RETURN 9FT ADLT (ELECTROSURGICAL) ×1
ELECTRODE REM PT RTRN 9FT ADLT (ELECTROSURGICAL) ×1 IMPLANT
GAUZE SPONGE 4X4 12PLY STRL LF (GAUZE/BANDAGES/DRESSINGS) ×1 IMPLANT
GLOVE BIO SURGEON STRL SZ7 (GLOVE) ×1 IMPLANT
GLOVE BIOGEL PI IND STRL 7.5 (GLOVE) ×1 IMPLANT
GOWN STRL REUS W/ TWL LRG LVL3 (GOWN DISPOSABLE) ×3 IMPLANT
GOWN STRL REUS W/TWL LRG LVL3 (GOWN DISPOSABLE) ×3
HEMOSTAT ARISTA ABSORB 3G PWDR (HEMOSTASIS) IMPLANT
KIT MARKER MARGIN INK (KITS) IMPLANT
NDL HYPO 25X1 1.5 SAFETY (NEEDLE) ×1 IMPLANT
NEEDLE HYPO 25X1 1.5 SAFETY (NEEDLE) ×1 IMPLANT
NS IRRIG 1000ML POUR BTL (IV SOLUTION) IMPLANT
PACK BASIN DAY SURGERY FS (CUSTOM PROCEDURE TRAY) ×1 IMPLANT
PENCIL SMOKE EVACUATOR (MISCELLANEOUS) ×1 IMPLANT
RETRACTOR ONETRAX LX 90X20 (MISCELLANEOUS) IMPLANT
SLEEVE SCD COMPRESS KNEE MED (STOCKING) ×1 IMPLANT
SPIKE FLUID TRANSFER (MISCELLANEOUS) IMPLANT
SPONGE T-LAP 4X18 ~~LOC~~+RFID (SPONGE) ×1 IMPLANT
STRIP CLOSURE SKIN 1/2X4 (GAUZE/BANDAGES/DRESSINGS) IMPLANT
SUT MNCRL AB 3-0 PS2 18 (SUTURE) IMPLANT
SUT MNCRL AB 4-0 PS2 18 (SUTURE) IMPLANT
SUT MON AB 5-0 PS2 18 (SUTURE) IMPLANT
SUT SILK 2 0 SH (SUTURE) IMPLANT
SUT VIC AB 2-0 SH 27 (SUTURE) ×2
SUT VIC AB 2-0 SH 27XBRD (SUTURE) ×1 IMPLANT
SUT VIC AB 3-0 SH 27 (SUTURE) ×2
SUT VIC AB 3-0 SH 27X BRD (SUTURE) ×1 IMPLANT
SUT VIC AB 5-0 PS2 18 (SUTURE) IMPLANT
SUT VICRYL AB 3 0 TIES (SUTURE) IMPLANT
SYR CONTROL 10ML LL (SYRINGE) ×1 IMPLANT
TOWEL GREEN STERILE FF (TOWEL DISPOSABLE) ×1 IMPLANT
TUBE CONNECTING 20X1/4 (TUBING) ×1 IMPLANT
YANKAUER SUCT BULB TIP NO VENT (SUCTIONS) ×1 IMPLANT

## 2022-11-08 NOTE — Discharge Instructions (Addendum)
[place] Office Phone Number [phone]  POST OP INSTRUCTIONS Take 400 mg of ibuprofen every 8 hours or 650 mg tylenol every 6 hours for next 72 hours then as needed. Use ice several times daily also.  A prescription for pain medication may be given to you upon discharge.  Take your pain medication as prescribed, if needed.  If narcotic pain medicine is not needed, then you may take acetaminophen (Tylenol), naprosyn (Alleve) or ibuprofen (Advil) as needed. Take your usually prescribed medications unless otherwise directed If you need a refill on your pain medication, please contact your pharmacy.  They will contact our office to request authorization.  Prescriptions will not be filled after 5pm or on week-ends. You should eat very light the first 24 hours after surgery, such as soup, crackers, pudding, etc.  Resume your normal diet the day after surgery. Most patients will experience some swelling and bruising in the breast.  Ice packs and a good support bra will help.  Wear the breast binder provided or a sports bra for 72 hours day and night.  After that wear a sports bra during the day until you return to the office. Swelling and bruising can take several days to resolve.  It is common to experience some constipation if taking pain medication after surgery.  Increasing fluid intake and taking a stool softener will usually help or prevent this problem from occurring.  A mild laxative (Milk of Magnesia or Miralax) should be taken according to package directions if there are no bowel movements after 48 hours. I used skin glue on the incision, you may shower in 24 hours.  The glue will flake off over the next 2-3 weeks.  Any sutures or staples will be removed at the office during your follow-up visit. ACTIVITIES:  You may resume regular daily activities (gradually increasing) beginning the next day.  Wearing a good support bra or sports bra minimizes pain and swelling.  You may have  sexual intercourse when it is comfortable. You may drive when you no longer are taking prescription pain medication, you can comfortably wear a seatbelt, and you can safely maneuver your car and apply brakes. RETURN TO WORK:  ______________________________________________________________________________________ Dennis Bast should see your doctor in the office for a follow-up appointment approximately two weeks after your surgery.  Your doctor's nurse will typically make your follow-up appointment when she calls you with your pathology report.  Expect your pathology report 3-4 business days after your surgery.  You may call to check if you do not hear from [place] after three days. OTHER INSTRUCTIONS: _______________________________________________________________________________________________ _____________________________________________________________________________________________________________________________________ _____________________________________________________________________________________________________________________________________ _____________________________________________________________________________________________________________________________________  WHEN TO CALL DR WAKEFIELD: Fever over 101.0 Nausea and/or vomiting. Extreme swelling or bruising. Continued bleeding from incision. Increased pain, redness, or drainage from the incision.  The clinic staff is available to answer your questions during regular business hours.  Please don't hesitate to call and ask to speak to one of the nurses for clinical concerns.  If you have a medical emergency, go to the nearest emergency room or call 911.  A surgeon from [facility] is always on call at the hospital.  For further questions, please visit [email or web address]    Post Anesthesia Home Care Instructions  Activity: Get plenty of rest for the remainder of the day. A responsible individual must stay  with you for 24 hours following the procedure.  For the next 24 hours, DO NOT: -Drive a car -Paediatric nurse -Drink alcoholic beverages -Take any medication unless instructed  by your physician -Make any legal decisions or sign important papers.  Meals: Start with liquid foods such as gelatin or soup. Progress to regular foods as tolerated. Avoid greasy, spicy, heavy foods. If nausea and/or vomiting occur, drink only clear liquids until the nausea and/or vomiting subsides. Call your physician if vomiting continues.  Special Instructions/Symptoms: Your throat may feel dry or sore from the anesthesia or the breathing tube placed in your throat during surgery. If this causes discomfort, gargle with warm salt water. The discomfort should disappear within 24 hours.

## 2022-11-08 NOTE — Op Note (Signed)
Preoperative diagnosis: right breast cancer s/p lumpectomy/sn biopsy Postoperative diagnosis: Same as above Procedure re-excision right breast lumpectomy Surgeon: Dr. Serita Grammes Estimated blood loss: Minimal Anesthesia: General Drains: None Specimens:Right breast tissue marked short superior, long lateral double deep Complications: None Special counts correct completion Disposition recovery stable condition   Indications: 67 year old otherwise fairly healthy female who has no prior breast history. She has a family history of prostate cancer in her father and her brother who had an at age 69. She had no mass or discharge. She underwent a screening mammogram that shows C density breast tissue. She was noted to have a right breast mass. On ultrasound this was 1.4 x 0.9 x 1.2 cm. There is no axillary adenopathy on ultrasound. 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 We did lumpectomy, sn and port. She has multiple nodes positive and has elected not to proceed with alnd.  Plan for margin reexcision.    Procedure: After informed consent obtained she was taken to the OR.  Antibiotics were given.  SCDs were placed.  She was placed under general anesthesia without complication.  She was prepped and draped in standard sterile surgical fashion.  Surgical timeout was then performed.   I reopened the lumpectomy incision and released my sutures.  I then excised the inferior margin and some additional next to it medially that was hard.  These were passed off.  I obtained hemostasis and then closed the breast tissue again with 2-0 vicryl. The skin was closed with 3-0 Vicryl as well as 4-0 Monocryl.  Glue and Steri-Strips were placed. She tolerated this well was extubated transferred recovery stable.

## 2022-11-08 NOTE — Interval H&P Note (Deleted)
History and Physical Interval Note:  11/08/2022 2:01 PM  Caitlin [name]  has presented today for surgery, with the diagnosis of RIGHT BREAST CANCER.  The various methods of treatment have been discussed with the patient and family. After consideration of risks, benefits and other options for treatment, the patient has consented to  Procedure(s): RE-EXCISION OF RIGHT BREAST LUMPECTOMY (Right) as a surgical intervention.  The patient's history has been reviewed, patient examined, no change in status, stable for surgery.  I have reviewed the patient's chart and labs.  Questions were answered to the patient's satisfaction.     Rolm Bookbinder

## 2022-11-08 NOTE — H&P (Signed)
Caitlin [name] is an 67 y.o. female.   Chief Complaint: breast cancer HPI: 67 yof s/p right lump/sn. Doing well. Her pathology ended up being a 1.4 cm grade III IDC (inferior margin positive) that is 10% weak er pos, pr neg , her 2 negative. She had five sn that were positive. She has undergone staging scans now. Her bone scan is negative. Her ct c/a/p has no metastatic disease. There are some postop changes and prominent and some enlarged nodes on ct scan extending to right [place] region. There were no other abnormal nodes when I did her surgery.   Past Medical History:  Diagnosis Date   Asthma    Dyspnea    History of kidney stones    Hypothyroidism    rt breast ca 08/2022   Right Breast   Scoliosis    Thyroid disease     Past Surgical History:  Procedure Laterality Date   AXILLARY SENTINEL NODE BIOPSY Right 10/14/2022   Procedure: RIGHT AXILLARY SENTINEL NODE BIOPSY;  Surgeon: Warnell Rasnic, MD;  Location: [facility];  Service: General;  Laterality: Right;   BREAST BIOPSY Right 09/15/2022   US RT BREAST BX W LOC DEV 1ST LESION IMG BX SPEC US GUIDE 09/15/2022 [facility]   BREAST BIOPSY  10/12/2022   US RT RADIOACTIVE SEED LOC 10/12/2022 [facility]   BREAST LUMPECTOMY WITH RADIOACTIVE SEED AND SENTINEL LYMPH NODE BIOPSY Right 10/14/2022   Procedure: RIGHT BREAST LUMPECTOMY WITH RADIOACTIVE SEED;  Surgeon: Laquilla Dault, MD;  Location: [facility] OR;  Service: General;  Laterality: Right;   COLONOSCOPY     LAPAROSCOPIC APPENDECTOMY     PORTACATH PLACEMENT Left 10/14/2022   Procedure: INSERTION PORT-A-CATH;  Surgeon: Jabree Rebert, MD;  Location: [facility];  Service: General;  Laterality: Left;   WISDOM TOOTH EXTRACTION      Family History  Problem Relation Age of Onset   Thyroid disease Mother    Prostate cancer Father 75       metastatic   Hypertension Father    Thyroid disease Sister        twin sister age 53   Asthma Sister    Prostate cancer Brother 56   Heart  attack Maternal Grandmother        died age 80's   Heart attack Maternal Grandfather        died age 80's   Clotting disorder Paternal Grandmother        cerebral hemorrage   Social History:  reports that she has never smoked. She does not have any smokeless tobacco history on file. She reports that she does not currently use alcohol. She reports that she does not use drugs.  Allergies:  Allergies  Allergen Reactions   Penicillins     REACTION: ?childhood reaction    Medications Prior to Admission  Medication Sig Dispense Refill   acetaminophen (TYLENOL) 500 MG tablet Take 500 mg by mouth every 6 (six) hours as needed for moderate pain.     aspirin 81 MG tablet Take 81 mg by mouth daily.       diltiazem (CARDIZEM CD) 120 MG 24 hr capsule Take 120 mg by mouth daily.     levothyroxine (SYNTHROID) 75 MCG tablet Take 75 mcg by mouth daily before breakfast.     Multiple Vitamins-Minerals (MULTIVITAMIN WITH MINERALS) tablet Take 1 tablet by mouth daily.     OVER THE COUNTER MEDICATION Take 1 capsule by mouth 2 (two) times daily. copaiba softgels       PULMICORT FLEXHALER 180 MCG/ACT inhaler Inhale 1 puff into the lungs daily.     traMADol (ULTRAM) 50 MG tablet Take 1 tablet (50 mg total) by mouth every 6 (six) hours as needed. 10 tablet 0   albuterol (VENTOLIN HFA) 108 (90 Base) MCG/ACT inhaler Inhale 2 puffs into the lungs every 4 (four) hours as needed for shortness of breath (Asthma).     benzonatate (TESSALON) 200 MG capsule Take 200 mg by mouth daily as needed for cough.     conjugated estrogens (PREMARIN) vaginal cream Place 1 applicator vaginally daily as needed.     dexamethasone (DECADRON) 4 MG tablet Take 2 tablets (8 mg total) by mouth daily for 3 days. Start the day after doxorubicin/cyclophosphamide chemotherapy. Take with food. 30 tablet 1   lidocaine-prilocaine (EMLA) cream Apply to affected area once 30 g 3   ondansetron (ZOFRAN) 8 MG tablet Take 1 tab (8 mg) by mouth every 8  hrs as needed for nausea/vomiting. Start third day after doxorubicin/cyclophosphamide chemotherapy. 30 tablet 1   prochlorperazine (COMPAZINE) 10 MG tablet Take 1 tablet (10 mg total) by mouth every 6 (six) hours as needed for nausea or vomiting. 30 tablet 1   tamsulosin (FLOMAX) 0.4 MG CAPS capsule Take 0.4 mg by mouth daily as needed (Kidney stones).     triamcinolone cream (KENALOG) 0.1 % Apply 1 Application topically daily as needed (Dry skin).      No results found for this or any previous visit (from the past 48 hour(s)). No results found.  Review of Systems  All other systems reviewed and are negative.   Blood pressure 128/77, pulse 70, temperature 97.8 F (36.6 C), temperature source Oral, resp. rate 18, height 5' 3" (1.6 m), weight 70.2 kg, SpO2 100 %. Physical Exam  Port in place Incision healing well without infection  Assessment/Plan Margin excision  She is doing well from surgery. She does not have stage IV disease. We discussed that she does absolutely need a margin reexcision. I will plan on doing that ASAP. We discussed at length additional nodal surgery. I do think that the standard answer for this would be an axillary lymph node dissection. She appears to have what is likely stage IIIc disease with possible supraclavicular node involvement at this point. We had a long discussion about the pros and cons of an axillary lymph node dissection. I do not think this is going to change her therapy and I do not think it will improve her survival as opposed to just doing chemotherapy and radiotherapy. After this discussion and her understanding that an axillary lymph node dissection is still the primary answer to this we are choosing to forego this and we will proceed with chemotherapy and radiation.    Haleema Vanderheyden, MD 11/08/2022, 2:02 PM    

## 2022-11-08 NOTE — Transfer of Care (Signed)
Immediate Anesthesia Transfer of Care Note  Patient: Caitlin [name]  Procedure(s) Performed: RE-EXCISION OF RIGHT BREAST LUMPECTOMY (Right: Breast)  Patient Location: PACU  Anesthesia Type:General  Level of Consciousness: awake, alert , oriented, and patient cooperative  Airway & Oxygen Therapy: Patient Spontanous Breathing and Patient connected to face mask oxygen  Post-op Assessment: Report given to RN and Post -op Vital signs reviewed and stable  Post vital signs: Reviewed and stable  Last Vitals:  Vitals Value Taken Time  BP    Temp    Pulse 91 11/08/22 1503  Resp 17 11/08/22 1503  SpO2 100 % 11/08/22 1503    Last Pain:  Vitals:   11/08/22 1204  TempSrc: Oral  PainSc: 1       Patients Stated Pain Goal: 6 (09/81/19 1478)  Complications: No notable events documented.

## 2022-11-08 NOTE — H&P (View-Only) (Signed)
Caitlin [name] is an 67 y.o. female.   Chief Complaint: breast cancer HPI: 63 yof s/p right lump/sn. Doing well. Her pathology ended up being a 1.4 cm grade III IDC (inferior margin positive) that is 10% weak er pos, pr neg , her 2 negative. She had five sn that were positive. She has undergone staging scans now. Her bone scan is negative. Her ct c/a/p has no metastatic disease. There are some postop changes and prominent and some enlarged nodes on ct scan extending to right [place] region. There were no other abnormal nodes when I did her surgery.   Past Medical History:  Diagnosis Date   Asthma    Dyspnea    History of kidney stones    Hypothyroidism    rt breast ca 08/2022   Right Breast   Scoliosis    Thyroid disease     Past Surgical History:  Procedure Laterality Date   AXILLARY SENTINEL NODE BIOPSY Right 10/14/2022   Procedure: RIGHT AXILLARY SENTINEL NODE BIOPSY;  Surgeon: Rolm Bookbinder, MD;  Location: [place];  Service: General;  Laterality: Right;   BREAST BIOPSY Right 09/15/2022   [place] RT BREAST BX W LOC DEV 1ST LESION IMG BX [place] US GUIDE 09/15/2022 [facility]   BREAST BIOPSY  10/12/2022   [place] RT RADIOACTIVE SEED LOC 10/12/2022 [facility]   BREAST LUMPECTOMY WITH RADIOACTIVE SEED AND SENTINEL LYMPH NODE BIOPSY Right 10/14/2022   Procedure: RIGHT BREAST LUMPECTOMY WITH RADIOACTIVE SEED;  Surgeon: Rolm Bookbinder, MD;  Location: [place];  Service: General;  Laterality: Right;   COLONOSCOPY     LAPAROSCOPIC APPENDECTOMY     PORTACATH PLACEMENT Left 10/14/2022   Procedure: INSERTION PORT-A-CATH;  Surgeon: Rolm Bookbinder, MD;  Location: [place];  Service: General;  Laterality: Left;   WISDOM TOOTH EXTRACTION      Family History  Problem Relation Age of Onset   Thyroid disease Mother    Prostate cancer Father 104       metastatic   Hypertension Father    Thyroid disease Sister        twin sister age 41   Asthma Sister    Prostate cancer Brother 63   Heart  attack Maternal Grandmother        died age 61's   Heart attack Maternal Grandfather        died age 52's   Clotting disorder Paternal Grandmother        cerebral hemorrage   Social History:  reports that she has never smoked. She does not have any smokeless tobacco history on file. She reports that she does not currently use alcohol. She reports that she does not use drugs.  Allergies:  Allergies  Allergen Reactions   Penicillins     REACTION: ?childhood reaction    Medications Prior to Admission  Medication Sig Dispense Refill   acetaminophen (TYLENOL) 500 MG tablet Take 500 mg by mouth every 6 (six) hours as needed for moderate pain.     aspirin 81 MG tablet Take 81 mg by mouth daily.       diltiazem (CARDIZEM CD) 120 MG 24 hr capsule Take 120 mg by mouth daily.     levothyroxine (SYNTHROID) 75 MCG tablet Take 75 mcg by mouth daily before breakfast.     Multiple Vitamins-Minerals (MULTIVITAMIN WITH MINERALS) tablet Take 1 tablet by mouth daily.     OVER THE COUNTER MEDICATION Take 1 capsule by mouth 2 (two) times daily. copaiba softgels  PULMICORT FLEXHALER 180 MCG/ACT inhaler Inhale 1 puff into the lungs daily.     traMADol (ULTRAM) 50 MG tablet Take 1 tablet (50 mg total) by mouth every 6 (six) hours as needed. 10 tablet 0   albuterol (VENTOLIN HFA) 108 (90 Base) MCG/ACT inhaler Inhale 2 puffs into the lungs every 4 (four) hours as needed for shortness of breath (Asthma).     benzonatate (TESSALON) 200 MG capsule Take 200 mg by mouth daily as needed for cough.     conjugated estrogens (PREMARIN) vaginal cream Place 1 applicator vaginally daily as needed.     dexamethasone (DECADRON) 4 MG tablet Take 2 tablets (8 mg total) by mouth daily for 3 days. Start the day after doxorubicin/cyclophosphamide chemotherapy. Take with food. 30 tablet 1   lidocaine-prilocaine (EMLA) cream Apply to affected area once 30 g 3   ondansetron (ZOFRAN) 8 MG tablet Take 1 tab (8 mg) by mouth every 8  hrs as needed for nausea/vomiting. Start third day after doxorubicin/cyclophosphamide chemotherapy. 30 tablet 1   prochlorperazine (COMPAZINE) 10 MG tablet Take 1 tablet (10 mg total) by mouth every 6 (six) hours as needed for nausea or vomiting. 30 tablet 1   tamsulosin (FLOMAX) 0.4 MG CAPS capsule Take 0.4 mg by mouth daily as needed (Kidney stones).     triamcinolone cream (KENALOG) 0.1 % Apply 1 Application topically daily as needed (Dry skin).      No results found for this or any previous visit (from the past 48 hour(s)). No results found.  Review of Systems  All other systems reviewed and are negative.   Blood pressure 128/77, pulse 70, temperature 97.8 F (36.6 C), temperature source Oral, resp. rate 18, height 5' 3"$  (1.6 m), weight 70.2 kg, SpO2 100 %. Physical Exam  Port in place Incision healing well without infection  Assessment/Plan Margin excision  She is doing well from surgery. She does not have stage IV disease. We discussed that she does absolutely need a margin reexcision. I will plan on doing that ASAP. We discussed at length additional nodal surgery. I do think that the standard answer for this would be an axillary lymph node dissection. She appears to have what is likely stage IIIc disease with possible supraclavicular node involvement at this point. We had a long discussion about the pros and cons of an axillary lymph node dissection. I do not think this is going to change her therapy and I do not think it will improve her survival as opposed to just doing chemotherapy and radiotherapy. After this discussion and her understanding that an axillary lymph node dissection is still the primary answer to this we are choosing to forego this and we will proceed with chemotherapy and radiation.    Rolm Bookbinder, MD 11/08/2022, 2:02 PM

## 2022-11-08 NOTE — Anesthesia Postprocedure Evaluation (Signed)
Anesthesia Post Note  Patient: Caitlin [name]  Procedure(s) Performed: RE-EXCISION OF RIGHT BREAST LUMPECTOMY (Right: Breast)     Patient location during evaluation: PACU Anesthesia Type: General Level of consciousness: awake and alert Pain management: pain level controlled Vital Signs Assessment: post-procedure vital signs reviewed and stable Respiratory status: spontaneous breathing, nonlabored ventilation, respiratory function stable and patient connected to nasal cannula oxygen Cardiovascular status: blood pressure returned to baseline and stable Postop Assessment: no apparent nausea or vomiting Anesthetic complications: no  No notable events documented.  Last Vitals:  Vitals:   11/08/22 1515 11/08/22 1530  BP: 108/65 111/67  Pulse: 81 70  Resp: 10 14  Temp:    SpO2: 97% 98%    Last Pain:  Vitals:   11/08/22 1530  TempSrc:   PainSc: 2                  Barnet Glasgow

## 2022-11-08 NOTE — Progress Notes (Signed)
The following biosimilar Udenyca ([id number]) has been selected for use in this patient.  Kennith Center, Pharm.D., CPP 11/08/2022'@11'$ :59 AM

## 2022-11-08 NOTE — Interval H&P Note (Signed)
History and Physical Interval Note:  11/08/2022 2:03 PM  Caitlin [name]  has presented today for surgery, with the diagnosis of RIGHT BREAST CANCER.  The various methods of treatment have been discussed with the patient and family. After consideration of risks, benefits and other options for treatment, the patient has consented to  Procedure(s): RE-EXCISION OF RIGHT BREAST LUMPECTOMY (Right) as a surgical intervention.  The patient's history has been reviewed, patient examined, no change in status, stable for surgery.  I have reviewed the patient's chart and labs.  Questions were answered to the patient's satisfaction.     Rolm Bookbinder

## 2022-11-08 NOTE — Anesthesia Procedure Notes (Signed)
Procedure Name: LMA Insertion Date/Time: 11/08/2022 2:18 PM  Performed by: Damiya Sandefur, Ernesta Amble, CRNAPre-anesthesia Checklist: Patient identified, Emergency Drugs available, Suction available and Patient being monitored Patient Re-evaluated:Patient Re-evaluated prior to induction Oxygen Delivery Method: Circle system utilized Preoxygenation: Pre-oxygenation with 100% oxygen Induction Type: IV induction Ventilation: Mask ventilation without difficulty LMA: LMA inserted LMA Size: 4.0 Number of attempts: 1 Airway Equipment and Method: Bite block Placement Confirmation: positive ETCO2 Tube secured with: Tape Dental Injury: Teeth and Oropharynx as per pre-operative assessment

## 2022-11-09 ENCOUNTER — Encounter (HOSPITAL_BASED_OUTPATIENT_CLINIC_OR_DEPARTMENT_OTHER): Payer: Self-pay | Admitting: General Surgery

## 2022-11-09 LAB — SURGICAL PATHOLOGY

## 2022-11-09 NOTE — Progress Notes (Signed)
Left message stating courtesy call and if any questions or concerns please call the doctors office.  

## 2022-11-15 ENCOUNTER — Other Ambulatory Visit: Payer: Self-pay | Admitting: General Surgery

## 2022-11-15 ENCOUNTER — Ambulatory Visit (HOSPITAL_COMMUNITY)
Admission: RE | Admit: 2022-11-15 | Discharge: 2022-11-15 | Disposition: A | Discharge status: Home / Self Care | Payer: BC Managed Care – PPO | Source: Ambulatory Visit | Attending: Hematology and Oncology | Admitting: Hematology and Oncology

## 2022-11-15 DIAGNOSIS — I427 Cardiomyopathy due to drug and external agent: Secondary | ICD-10-CM | POA: Diagnosis not present

## 2022-11-15 DIAGNOSIS — Z0189 Encounter for other specified special examinations: Secondary | ICD-10-CM

## 2022-11-15 DIAGNOSIS — I351 Nonrheumatic aortic (valve) insufficiency: Secondary | ICD-10-CM | POA: Insufficient documentation

## 2022-11-15 DIAGNOSIS — E079 Disorder of thyroid, unspecified: Secondary | ICD-10-CM | POA: Diagnosis not present

## 2022-11-15 DIAGNOSIS — C50411 Malignant neoplasm of upper-outer quadrant of right female breast: Secondary | ICD-10-CM | POA: Diagnosis present

## 2022-11-15 DIAGNOSIS — Z170 Estrogen receptor positive status [ER+]: Secondary | ICD-10-CM

## 2022-11-15 LAB — ECHOCARDIOGRAM COMPLETE
Area-P 1/2: 2.88 cm2
P 1/2 time: 354 msec
S' Lateral: 2.1 cm

## 2022-11-15 NOTE — Progress Notes (Signed)
Pharmacist Chemotherapy Monitoring - Initial Assessment    Anticipated start date: 11/22/22   The following has been reviewed per standard work regarding the patient's treatment regimen: The patient's diagnosis, treatment plan and drug doses, and organ/hematologic function Lab orders and baseline tests specific to treatment regimen  The treatment plan start date, drug sequencing, and pre-medications Prior authorization status  [name] documented medication list, including drug-drug interaction screen and prescriptions for anti-emetics and supportive care specific to the treatment regimen The drug concentrations, fluid compatibility, administration routes, and timing of the medications to be used The patient's access for treatment and lifetime cumulative dose history, if applicable  The patient's medication allergies and previous infusion related reactions, if applicable   Changes made to treatment plan:  N/A  Follow up needed:  N/A   Acquanetta Belling, RPH, BCPS, BCOP 11/15/2022  1:47 PM

## 2022-11-15 NOTE — Progress Notes (Signed)
  Echocardiogram 2D Echocardiogram has been performed.  Caitlin [name] M 11/15/2022, 11:50 AM

## 2022-11-16 ENCOUNTER — Inpatient Hospital Stay: Payer: BC Managed Care – PPO

## 2022-11-16 ENCOUNTER — Inpatient Hospital Stay: Payer: BC Managed Care – PPO | Admitting: Pharmacist

## 2022-11-16 ENCOUNTER — Encounter: Payer: Self-pay | Admitting: Hematology and Oncology

## 2022-11-16 DIAGNOSIS — Z170 Estrogen receptor positive status [ER+]: Secondary | ICD-10-CM

## 2022-11-16 DIAGNOSIS — Z5111 Encounter for antineoplastic chemotherapy: Secondary | ICD-10-CM | POA: Diagnosis not present

## 2022-11-16 NOTE — Progress Notes (Signed)
[name]       Telephone: [phone]?Fax: [phone]   Oncology Clinical Pharmacist Practitioner Initial Assessment  Caitlin [name] is a 67 y.o. female with a diagnosis of breast cancer. They were contacted today via in-person visit.  Indication/Regimen Doxorubicin (Adriamycin) and cyclophosphamide (Cytoxan) followed by paclitaxel (Taxol) are being used appropriately for treatment of breast cancer by Dr. Nicholas Lose.      Wt Readings from Last 1 Encounters:  11/08/22 154 lb 12.2 oz (70.2 kg)    Estimated body surface area is 1.77 meters squared as calculated from the following:   Height as of 11/08/22: 5' 3"$  (1.6 m).   Weight as of 11/08/22: 154 lb 12.2 oz (70.2 kg).  The dosing regimen is every 14 days for 4 cycles  Doxorubicin (60 mg/m2) on Day 1 Cyclophosphamide (600 mg/m2) on Day 1 Pegfilgrastim (6 mg) on Day 3  Followed by a dosing regimen that is every 7 days for 12 cycles  Paclitaxel (80 mg/m2) on Days 1, 8, 15, 22  It is planned to continue until treatment plan completion or unacceptable toxicity. The tentative start date is: TBD. Was going to be 11/22/22 but patient states she had positive margins and they are going to do a re-excision. Treatment plan will likely need to be deferred. Nurse navigators and Dr. Lindi Adie aware.  Dose Modifications None at this time   Allergies Allergies  Allergen Reactions   Penicillins     REACTION: ?childhood reaction    Vitals = no vitals/labs done today with education visit    11/08/2022    4:15 PM 11/08/2022    3:30 PM 11/08/2022    3:15 PM  Oncology Vitals  Temp 97.5 F (36.4 C)    Pulse Rate 76 70 81  BP 127/76 111/67 108/65  Resp 15 14 10  $ SpO2 97 % 98 % 97 %     Laboratory Data    Latest Ref Rng & Units 10/13/2022    9:30 AM  CBC EXTENDED  WBC 4.0 - 10.5 K/uL 11.1   RBC 3.87 - 5.11 MIL/uL 4.22   Hemoglobin 12.0 - 15.0 g/dL 12.6   HCT 36.0 - 46.0 % 39.7   Platelets 150 - 400 K/uL 350         Latest Ref Rng & Units 10/13/2022    9:30 AM  CMP  Glucose 70 - 99 mg/dL 89   BUN 8 - 23 mg/dL 14   Creatinine 0.44 - 1.00 mg/dL 0.89   Sodium 135 - 145 mmol/L 137   Potassium 3.5 - 5.1 mmol/L 4.1   Chloride 98 - 111 mmol/L 102   CO2 22 - 32 mmol/L 25   Calcium 8.9 - 10.3 mg/dL 8.9    Contraindications Contraindications were reviewed? Yes Contraindications to therapy were identified? No . She is on several herbal supplements and we discussed that they are not regulated by the FDA. We mentioned that it is very hard to know for certain how they will interact with her current medications and the chemotherapy. She verbalized understanding and accepts this risk.  Safety Precautions The following safety precautions were reviewed:  Fever: reviewed the importance of having a thermometer and the Centers for Disease Control and Prevention (CDC) definition of fever which is 100.85F (38C) or higher. Patient should call 24/7 triage at [phone] if experiencing a fever or any other symptoms Decreased white blood cells (WBCs) and increased risk for infection Decreased platelet count and increased risk  of bleeding Decreased hemoglobin, part of the red blood cells that carry iron and oxygen Change in the color of urine Fatigue Hair loss Nausea or vomiting Mouth irritation or sores Doxorubicin (vesicant) Cardiotoxicity Hemorrhagic cystitis Secondary cancers Muscle or joint pain or weakness Peripheral neuropathy Hypersensitivity reactions Avoid grapefruit products Intimacy, sexual activity, contraception, fertility Handling body fluids and waste On several herbal supplements. As above, possible interactions with chemotherapy but unable to know for certain as not regulated by FDA.  Medication Reconciliation Current Outpatient Medications  Medication Sig Dispense Refill   aspirin 81 MG tablet Take 81 mg by mouth daily.       diltiazem (CARDIZEM CD) 120 MG 24 hr capsule Take 120 mg by  mouth daily.     levothyroxine (SYNTHROID) 75 MCG tablet Take 75 mcg by mouth daily before breakfast.     Multiple Vitamins-Minerals (MULTIVITAMIN WITH MINERALS) tablet Take 1 tablet by mouth daily.     OVER THE COUNTER MEDICATION Take 1 capsule by mouth 2 (two) times daily. copaiba softgels     PULMICORT FLEXHALER 180 MCG/ACT inhaler Inhale 1 puff into the lungs daily.     acetaminophen (TYLENOL) 500 MG tablet Take 500 mg by mouth every 6 (six) hours as needed for moderate pain. (Patient not taking: Reported on 11/16/2022)     albuterol (VENTOLIN HFA) 108 (90 Base) MCG/ACT inhaler Inhale 2 puffs into the lungs every 4 (four) hours as needed for shortness of breath (Asthma). (Patient not taking: Reported on 11/16/2022)     benzonatate (TESSALON) 200 MG capsule Take 200 mg by mouth daily as needed for cough. (Patient not taking: Reported on 11/16/2022)     conjugated estrogens (PREMARIN) vaginal cream Place 1 applicator vaginally daily as needed. (Patient not taking: Reported on 11/16/2022)     dexamethasone (DECADRON) 4 MG tablet Take 2 tablets (8 mg total) by mouth daily for 3 days. Start the day after doxorubicin/cyclophosphamide chemotherapy. Take with food. (Patient not taking: Reported on 11/16/2022) 30 tablet 1   lidocaine-prilocaine (EMLA) cream Apply to affected area once (Patient not taking: Reported on 11/16/2022) 30 g 3   ondansetron (ZOFRAN) 8 MG tablet Take 1 tab (8 mg) by mouth every 8 hrs as needed for nausea/vomiting. Start third day after doxorubicin/cyclophosphamide chemotherapy. (Patient not taking: Reported on 11/16/2022) 30 tablet 1   prochlorperazine (COMPAZINE) 10 MG tablet Take 1 tablet (10 mg total) by mouth every 6 (six) hours as needed for nausea or vomiting. (Patient not taking: Reported on 11/16/2022) 30 tablet 1   tamsulosin (FLOMAX) 0.4 MG CAPS capsule Take 0.4 mg by mouth daily as needed (Kidney stones). (Patient not taking: Reported on 11/16/2022)     traMADol (ULTRAM) 50 MG  tablet Take 1 tablet (50 mg total) by mouth every 6 (six) hours as needed. (Patient not taking: Reported on 11/16/2022) 10 tablet 0   triamcinolone cream (KENALOG) 0.1 % Apply 1 Application topically daily as needed (Dry skin). (Patient not taking: Reported on 11/16/2022)     No current facility-administered medications for this visit.   Medication reconciliation is based on the patient's most recent medication list in the electronic medical record (EMR) including herbal products and OTC medications.   The patient's medication list was reviewed today with the patient? Yes   Drug-drug interactions (DDIs) DDIs were evaluated? Yes Significant DDIs identified?  Nothing significant has flagged. However, as discussed above, herbal supplements do not always populate and there could be risk that is not captured. Patient verbalized understanding  and accepts the risk.  Drug-Food Interactions Drug-food interactions were evaluated? Yes Drug-food interactions identified?  Avoid grapefruit products  Follow-up Plan  Treatment start date: TBD, re-excision likely due to positive margins. Treatment plan will likely need to be deferred which will notify scheduling of the new start date Port placement date: 10/14/22 ECHO date: 11/15/22 Reviewed prescriptions, premedications, and chemotherapy sequencing. Reviewed possible side effects and management strategies Clinical pharmacy will assist Ms. Straley and  Dr. Nicholas Lose on an as needed basis going forward Possible adjuvant abemaciclib + endocrine therapy candidate  TANNETTE CLEMENSON participated in the discussion, expressed understanding, and voiced agreement with the above plan. All questions were answered to her satisfaction. The patient was advised to contact the clinic at [phone] with any questions or concerns prior to her return visit.   I spent 60 minutes assessing the patient.  Raina Mina, RPH-CPP, 11/16/2022 9:47 AM  **Disclaimer: This note  was dictated with voice recognition software. Similar sounding words can inadvertently be transcribed and this note may contain transcription errors which may not have been corrected upon publication of note.**

## 2022-11-17 ENCOUNTER — Telehealth: Payer: Self-pay | Admitting: Hematology and Oncology

## 2022-11-17 ENCOUNTER — Other Ambulatory Visit: Payer: Self-pay

## 2022-11-17 ENCOUNTER — Encounter (HOSPITAL_COMMUNITY): Payer: Self-pay | Admitting: General Surgery

## 2022-11-17 NOTE — Progress Notes (Signed)
PCP - Gar Ponto, MD Cardiologist - denies Oncologist - Nicholas Lose, MD  PPM/ICD - denies  EKG - 10/13/22 ECHO - 11/15/22 Cardiac Cath - denies  CPAP - n/a  Fasting Blood Sugar - n/a  Blood Thinner Instructions: Aspirin - last dose 11/16/22 per patient Patient was instructed: As of today, STOP taking any Aspirin (unless otherwise instructed by your surgeon) Aleve, Naproxen, Ibuprofen, Motrin, Advil, Goody's, BC's, all herbal medications, fish oil, and all vitamins.   ERAS Protcol - yes, until 07:00 o'clock  COVID TEST- n/a  Anesthesia review: yes  Patient verbally denies any shortness of breath, fever, cough and chest pain during phone call   -------------  SDW INSTRUCTIONS given:  Your procedure is scheduled on Thursday, February 15th, 2024.  Report to [facility] Main Entrance "A" at 07:30 A.M., and check in at the Admitting office.  Call this number if you have problems the morning of surgery:  [phone]   Remember:  Do not eat after midnight the night before your surgery  You may drink clear liquids until 07:00 the morning of your surgery.   Clear liquids allowed are: Water, Non-Citrus Juices (without pulp), Carbonated Beverages, Clear Tea, Black Coffee Only, and Gatorade    Take these medicines the morning of surgery with A SIP OF WATER: Cardizem, Synthroid PRN: Tylenol, Tessalon, inhalers  Please bring all inhalers with you the day of surgery.     The day of surgery:                     Do not wear jewelry, make up, or nail polish            Do not wear lotions, powders, perfumes, or deodorant.            Do not shave 48 hours prior to surgery.              Do not bring valuables to the hospital.            [facility] is not responsible for any belongings or valuables.  Do NOT Smoke (Tobacco/Vaping) 24 hours prior to your procedure If you use a CPAP at night, you may bring all equipment for your overnight stay.   Contacts, glasses, dentures or  bridgework may not be worn into surgery.      For patients admitted to the hospital, discharge time will be determined by your treatment team.   Patients discharged the day of surgery will not be allowed to drive home, and someone needs to stay with them for 24 hours.    Special instructions:   [place]- Preparing For Surgery  Before surgery, you can play an important role. Because skin is not sterile, your skin needs to be as free of germs as possible. You can reduce the number of germs on your skin by washing with CHG (chlorahexidine gluconate) Soap before surgery.  CHG is an antiseptic cleaner which kills germs and bonds with the skin to continue killing germs even after washing.    Oral Hygiene is also important to reduce your risk of infection.  Remember - BRUSH YOUR TEETH THE MORNING OF SURGERY WITH YOUR REGULAR TOOTHPASTE  Please do not use if you have an allergy to CHG or antibacterial soaps. If your skin becomes reddened/irritated stop using the CHG.  Do not shave (including legs and underarms) for at least 48 hours prior to first CHG shower. It is OK to shave your face.  Please follow  these instructions carefully.   Shower the NIGHT BEFORE SURGERY and the MORNING OF SURGERY with DIAL Soap.   Pat yourself dry with a CLEAN TOWEL.  Wear CLEAN PAJAMAS to bed the night before surgery  Place CLEAN SHEETS on your bed the night of your first shower and DO NOT SLEEP WITH PETS.   Day of Surgery: Please shower morning of surgery  Wear Clean/Comfortable clothing the morning of surgery Do not apply any deodorants/lotions.   Remember to brush your teeth WITH YOUR REGULAR TOOTHPASTE.   Questions were answered. Patient verbalized understanding of instructions.

## 2022-11-17 NOTE — Telephone Encounter (Signed)
Scheduled appointments per WQ. Patient is aware of all made appointments.

## 2022-11-18 ENCOUNTER — Encounter: Payer: Self-pay | Admitting: *Deleted

## 2022-11-18 ENCOUNTER — Ambulatory Visit (HOSPITAL_COMMUNITY): Payer: BC Managed Care – PPO | Admitting: Certified Registered Nurse Anesthetist

## 2022-11-18 ENCOUNTER — Encounter (HOSPITAL_COMMUNITY)
Admission: RE | Disposition: A | Discharge status: Home / Self Care | Payer: Self-pay | Source: Home / Self Care | Attending: General Surgery

## 2022-11-18 ENCOUNTER — Other Ambulatory Visit: Payer: Self-pay

## 2022-11-18 ENCOUNTER — Encounter (HOSPITAL_COMMUNITY): Payer: Self-pay | Admitting: General Surgery

## 2022-11-18 ENCOUNTER — Ambulatory Visit (HOSPITAL_COMMUNITY)
Admission: RE | Admit: 2022-11-18 | Discharge: 2022-11-18 | Disposition: A | Discharge status: Home / Self Care | Payer: BC Managed Care – PPO | Attending: General Surgery | Admitting: General Surgery

## 2022-11-18 DIAGNOSIS — N6081 Other benign mammary dysplasias of right breast: Secondary | ICD-10-CM | POA: Insufficient documentation

## 2022-11-18 DIAGNOSIS — E039 Hypothyroidism, unspecified: Secondary | ICD-10-CM | POA: Diagnosis not present

## 2022-11-18 DIAGNOSIS — Z170 Estrogen receptor positive status [ER+]: Secondary | ICD-10-CM | POA: Diagnosis not present

## 2022-11-18 DIAGNOSIS — C50911 Malignant neoplasm of unspecified site of right female breast: Secondary | ICD-10-CM | POA: Insufficient documentation

## 2022-11-18 DIAGNOSIS — J45909 Unspecified asthma, uncomplicated: Secondary | ICD-10-CM | POA: Diagnosis not present

## 2022-11-18 DIAGNOSIS — I4891 Unspecified atrial fibrillation: Secondary | ICD-10-CM | POA: Diagnosis not present

## 2022-11-18 HISTORY — PX: RE-EXCISION OF BREAST LUMPECTOMY: SHX6048

## 2022-11-18 SURGERY — EXCISION, LESION, BREAST
Anesthesia: General | Site: Breast | Laterality: Right

## 2022-11-18 MED ORDER — KETOROLAC TROMETHAMINE 30 MG/ML IJ SOLN
INTRAMUSCULAR | Status: AC
  Filled 2022-11-18: qty 1

## 2022-11-18 MED ORDER — PROPOFOL 500 MG/50ML IV EMUL
INTRAVENOUS | Status: DC | PRN
  Administered 2022-11-18: 150 ug/kg/min via INTRAVENOUS

## 2022-11-18 MED ORDER — PROPOFOL 10 MG/ML IV BOLUS
INTRAVENOUS | Status: AC
  Filled 2022-11-18: qty 20

## 2022-11-18 MED ORDER — LIDOCAINE 2% (20 MG/ML) 5 ML SYRINGE
INTRAMUSCULAR | Status: AC
  Filled 2022-11-18: qty 5

## 2022-11-18 MED ORDER — BUPIVACAINE HCL (PF) 0.25 % IJ SOLN
INTRAMUSCULAR | Status: AC
  Filled 2022-11-18: qty 30

## 2022-11-18 MED ORDER — LIDOCAINE 2% (20 MG/ML) 5 ML SYRINGE
INTRAMUSCULAR | Status: DC | PRN
  Administered 2022-11-18: 60 mg via INTRAVENOUS

## 2022-11-18 MED ORDER — MIDAZOLAM HCL 2 MG/2ML IJ SOLN
INTRAMUSCULAR | Status: DC | PRN
  Administered 2022-11-18: 2 mg via INTRAVENOUS

## 2022-11-18 MED ORDER — OXYCODONE HCL 5 MG PO TABS
ORAL_TABLET | ORAL | Status: AC
  Filled 2022-11-18: qty 1

## 2022-11-18 MED ORDER — HYDROMORPHONE HCL 1 MG/ML IJ SOLN
0.2500 mg | INTRAMUSCULAR | Status: DC | PRN
  Administered 2022-11-18 (×2): 0.25 mg via INTRAVENOUS
  Administered 2022-11-18: 0.5 mg via INTRAVENOUS

## 2022-11-18 MED ORDER — SUCCINYLCHOLINE CHLORIDE 200 MG/10ML IV SOSY
PREFILLED_SYRINGE | INTRAVENOUS | Status: AC
  Filled 2022-11-18: qty 10

## 2022-11-18 MED ORDER — CHLORHEXIDINE GLUCONATE 0.12 % MT SOLN
15.0000 mL | Freq: Once | OROMUCOSAL | Status: AC
  Administered 2022-11-18: 15 mL via OROMUCOSAL
  Filled 2022-11-18: qty 15

## 2022-11-18 MED ORDER — PHENYLEPHRINE 80 MCG/ML (10ML) SYRINGE FOR IV PUSH (FOR BLOOD PRESSURE SUPPORT)
PREFILLED_SYRINGE | INTRAVENOUS | Status: AC
  Filled 2022-11-18: qty 10

## 2022-11-18 MED ORDER — ONDANSETRON HCL 4 MG/2ML IJ SOLN
INTRAMUSCULAR | Status: AC
  Filled 2022-11-18: qty 2

## 2022-11-18 MED ORDER — CIPROFLOXACIN IN D5W 400 MG/200ML IV SOLN
400.0000 mg | INTRAVENOUS | Status: AC
  Administered 2022-11-18: 400 mg via INTRAVENOUS
  Filled 2022-11-18: qty 200

## 2022-11-18 MED ORDER — CHLORHEXIDINE GLUCONATE CLOTH 2 % EX PADS: 6.0000 | MEDICATED_PAD | Freq: Once | CUTANEOUS | Status: DC

## 2022-11-18 MED ORDER — ACETAMINOPHEN 500 MG PO TABS
1000.0000 mg | ORAL_TABLET | ORAL | Status: DC
  Filled 2022-11-18: qty 2

## 2022-11-18 MED ORDER — DEXAMETHASONE SODIUM PHOSPHATE 10 MG/ML IJ SOLN
INTRAMUSCULAR | Status: DC | PRN
  Administered 2022-11-18: 4 mg via INTRAVENOUS

## 2022-11-18 MED ORDER — AMISULPRIDE (ANTIEMETIC) 5 MG/2ML IV SOLN: 10.0000 mg | Freq: Once | INTRAVENOUS | Status: DC | PRN

## 2022-11-18 MED ORDER — MIDAZOLAM HCL 2 MG/2ML IJ SOLN
INTRAMUSCULAR | Status: AC
  Filled 2022-11-18: qty 2

## 2022-11-18 MED ORDER — OXYCODONE HCL 5 MG PO TABS
5.0000 mg | ORAL_TABLET | Freq: Once | ORAL | Status: AC | PRN
  Administered 2022-11-18: 5 mg via ORAL

## 2022-11-18 MED ORDER — PROPOFOL 10 MG/ML IV BOLUS
INTRAVENOUS | Status: DC | PRN
  Administered 2022-11-18: 150 mg via INTRAVENOUS
  Administered 2022-11-18: 50 mg via INTRAVENOUS

## 2022-11-18 MED ORDER — PHENYLEPHRINE 80 MCG/ML (10ML) SYRINGE FOR IV PUSH (FOR BLOOD PRESSURE SUPPORT)
PREFILLED_SYRINGE | INTRAVENOUS | Status: DC | PRN
  Administered 2022-11-18: 40 ug via INTRAVENOUS

## 2022-11-18 MED ORDER — HYDROMORPHONE HCL 1 MG/ML IJ SOLN
INTRAMUSCULAR | Status: AC
  Filled 2022-11-18: qty 1

## 2022-11-18 MED ORDER — ONDANSETRON HCL 4 MG/2ML IJ SOLN: 4.0000 mg | Freq: Once | INTRAMUSCULAR | Status: DC | PRN

## 2022-11-18 MED ORDER — DEXAMETHASONE SODIUM PHOSPHATE 10 MG/ML IJ SOLN
INTRAMUSCULAR | Status: AC
  Filled 2022-11-18: qty 1

## 2022-11-18 MED ORDER — OXYCODONE HCL 5 MG/5ML PO SOLN: 5.0000 mg | Freq: Once | ORAL | Status: AC | PRN

## 2022-11-18 MED ORDER — BUPIVACAINE HCL 0.25 % IJ SOLN
INTRAMUSCULAR | Status: DC | PRN
  Administered 2022-11-18: 10 mL

## 2022-11-18 MED ORDER — EPHEDRINE 5 MG/ML INJ
INTRAVENOUS | Status: AC
  Filled 2022-11-18: qty 5

## 2022-11-18 MED ORDER — LACTATED RINGERS IV SOLN: INTRAVENOUS | Status: DC

## 2022-11-18 MED ORDER — ROCURONIUM BROMIDE 10 MG/ML (PF) SYRINGE
PREFILLED_SYRINGE | INTRAVENOUS | Status: AC
  Filled 2022-11-18: qty 10

## 2022-11-18 MED ORDER — 0.9 % SODIUM CHLORIDE (POUR BTL) OPTIME
TOPICAL | Status: DC | PRN
  Administered 2022-11-18: 1000 mL

## 2022-11-18 MED ORDER — FENTANYL CITRATE (PF) 250 MCG/5ML IJ SOLN
INTRAMUSCULAR | Status: AC
  Filled 2022-11-18: qty 5

## 2022-11-18 MED ORDER — FENTANYL CITRATE (PF) 250 MCG/5ML IJ SOLN
INTRAMUSCULAR | Status: DC | PRN
  Administered 2022-11-18: 50 ug via INTRAVENOUS

## 2022-11-18 MED ORDER — ORAL CARE MOUTH RINSE: 15.0000 mL | Freq: Once | OROMUCOSAL | Status: AC

## 2022-11-18 MED ORDER — ENSURE PRE-SURGERY PO LIQD: 296.0000 mL | Freq: Once | ORAL | Status: DC

## 2022-11-18 MED ORDER — ONDANSETRON HCL 4 MG/2ML IJ SOLN
INTRAMUSCULAR | Status: DC | PRN
  Administered 2022-11-18: 4 mg via INTRAVENOUS

## 2022-11-18 SURGICAL SUPPLY — 39 items
ADH SKN CLS APL DERMABOND .7 (GAUZE/BANDAGES/DRESSINGS) ×1
APL PRP STRL LF DISP 70% ISPRP (MISCELLANEOUS) ×1
APPLIER CLIP 9.375 MED OPEN (MISCELLANEOUS) ×1
APR CLP MED 9.3 20 MLT OPN (MISCELLANEOUS) ×1
BAG COUNTER SPONGE SURGICOUNT (BAG) ×1 IMPLANT
BAG SPNG CNTER NS LX DISP (BAG) ×1
BINDER BREAST XLRG (GAUZE/BANDAGES/DRESSINGS) IMPLANT
CANISTER SUCT 3000ML PPV (MISCELLANEOUS) ×1 IMPLANT
CHLORAPREP W/TINT 26 (MISCELLANEOUS) ×1 IMPLANT
CLIP APPLIE 9.375 MED OPEN (MISCELLANEOUS) IMPLANT
COVER SURGICAL LIGHT HANDLE (MISCELLANEOUS) ×1 IMPLANT
DERMABOND ADVANCED .7 DNX12 (GAUZE/BANDAGES/DRESSINGS) ×1 IMPLANT
DRAPE CHEST BREAST 15X10 FENES (DRAPES) ×1 IMPLANT
ELECT CAUTERY BLADE 6.4 (BLADE) ×1 IMPLANT
ELECT REM PT RETURN 9FT ADLT (ELECTROSURGICAL) ×1
ELECTRODE REM PT RTRN 9FT ADLT (ELECTROSURGICAL) ×1 IMPLANT
GLOVE BIO SURGEON STRL SZ7 (GLOVE) ×1 IMPLANT
GLOVE BIOGEL PI IND STRL 7.5 (GLOVE) ×1 IMPLANT
GOWN STRL REUS W/ TWL LRG LVL3 (GOWN DISPOSABLE) ×2 IMPLANT
GOWN STRL REUS W/TWL LRG LVL3 (GOWN DISPOSABLE) ×2
KIT BASIN OR (CUSTOM PROCEDURE TRAY) ×1 IMPLANT
KIT TURNOVER KIT B (KITS) ×1 IMPLANT
NDL HYPO 25GX1X1/2 BEV (NEEDLE) ×1 IMPLANT
NEEDLE HYPO 25GX1X1/2 BEV (NEEDLE) ×1 IMPLANT
NS IRRIG 1000ML POUR BTL (IV SOLUTION) ×1 IMPLANT
PACK GENERAL/GYN (CUSTOM PROCEDURE TRAY) ×1 IMPLANT
PAD ARMBOARD 7.5X6 YLW CONV (MISCELLANEOUS) ×1 IMPLANT
RETRACTOR ONETRAX LX 90X20 (MISCELLANEOUS) IMPLANT
SPIKE FLUID TRANSFER (MISCELLANEOUS) ×1 IMPLANT
STRIP CLOSURE SKIN 1/4X3 (GAUZE/BANDAGES/DRESSINGS) IMPLANT
SUT MNCRL AB 4-0 PS2 18 (SUTURE) IMPLANT
SUT MON AB 5-0 PS2 18 (SUTURE) IMPLANT
SUT SILK 2 0 SH (SUTURE) IMPLANT
SUT VIC AB 2-0 SH 27 (SUTURE) ×2
SUT VIC AB 2-0 SH 27XBRD (SUTURE) ×1 IMPLANT
SUT VIC AB 3-0 SH 27 (SUTURE) ×1
SUT VIC AB 3-0 SH 27X BRD (SUTURE) ×1 IMPLANT
SYR CONTROL 10ML LL (SYRINGE) ×1 IMPLANT
TOWEL GREEN STERILE (TOWEL DISPOSABLE) ×1 IMPLANT

## 2022-11-18 NOTE — Discharge Instructions (Signed)
[facility] Office Phone Number [phone]  POST OP INSTRUCTIONS Take 400 mg of ibuprofen every 8 hours or 650 mg tylenol every 6 hours for next 72 hours then as needed. Use ice several times daily also.  A prescription for pain medication may be given to you upon discharge.  Take your pain medication as prescribed, if needed.  If narcotic pain medicine is not needed, then you may take acetaminophen (Tylenol), naprosyn (Alleve) or ibuprofen (Advil) as needed. Take your usually prescribed medications unless otherwise directed If you need a refill on your pain medication, please contact your pharmacy.  They will contact our office to request authorization.  Prescriptions will not be filled after 5pm or on week-ends. You should eat very light the first 24 hours after surgery, such as soup, crackers, pudding, etc.  Resume your normal diet the day after surgery. Most patients will experience some swelling and bruising in the breast.  Ice packs and a good support bra will help.  Wear the breast binder provided or a sports bra for 72 hours day and night.  After that wear a sports bra during the day until you return to the office. Swelling and bruising can take several days to resolve.  It is common to experience some constipation if taking pain medication after surgery.  Increasing fluid intake and taking a stool softener will usually help or prevent this problem from occurring.  A mild laxative (Milk of Magnesia or Miralax) should be taken according to package directions if there are no bowel movements after 48 hours. I used skin glue on the incision, you may shower in 24 hours.  The glue will flake off over the next 2-3 weeks.  Any sutures or staples will be removed at the office during your follow-up visit. ACTIVITIES:  You may resume regular daily activities (gradually increasing) beginning the next day.  Wearing a good support bra or sports bra minimizes pain and swelling.  You may have  sexual intercourse when it is comfortable. You may drive when you no longer are taking prescription pain medication, you can comfortably wear a seatbelt, and you can safely maneuver your car and apply brakes. RETURN TO WORK:  ______________________________________________________________________________________ You should see your doctor in the office for a follow-up appointment approximately two weeks after your surgery.  Your doctor's nurse will typically make your follow-up appointment when she calls you with your pathology report.  Expect your pathology report 3-4 business days after your surgery.  You may call to check if you do not hear from us after three days. OTHER INSTRUCTIONS: _______________________________________________________________________________________________ _____________________________________________________________________________________________________________________________________ _____________________________________________________________________________________________________________________________________ _____________________________________________________________________________________________________________________________________  WHEN TO CALL DR Karriem Muench: Fever over 101.0 Nausea and/or vomiting. Extreme swelling or bruising. Continued bleeding from incision. Increased pain, redness, or drainage from the incision.  The clinic staff is available to answer your questions during regular business hours.  Please don't hesitate to call and ask to speak to one of the nurses for clinical concerns.  If you have a medical emergency, go to the nearest emergency room or call 911.  A surgeon from [facility] is always on call at the hospital.  For further questions, please visit [email or web address]  

## 2022-11-18 NOTE — Anesthesia Procedure Notes (Signed)
Procedure Name: LMA Insertion Date/Time: 11/18/2022 10:06 AM  Performed by: Dorthea Cove, CRNAPre-anesthesia Checklist: Patient identified, Emergency Drugs available, Suction available and Patient being monitored Patient Re-evaluated:Patient Re-evaluated prior to induction Oxygen Delivery Method: Circle System Utilized Preoxygenation: Pre-oxygenation with 100% oxygen Induction Type: IV induction Ventilation: Mask ventilation without difficulty LMA: LMA inserted LMA Size: 4.0 Number of attempts: 1 Airway Equipment and Method: Bite block Placement Confirmation: positive ETCO2 Tube secured with: Tape Dental Injury: Teeth and Oropharynx as per pre-operative assessment

## 2022-11-18 NOTE — Anesthesia Postprocedure Evaluation (Signed)
Anesthesia Post Note  Patient: Caitlin [name]  Procedure(s) Performed: RE-EXCISION OF RIGHT BREAST LUMPECTOMY (Right: Breast)     Patient location during evaluation: PACU Anesthesia Type: General Level of consciousness: awake and alert, oriented and patient cooperative Pain management: pain level controlled Vital Signs Assessment: post-procedure vital signs reviewed and stable Respiratory status: spontaneous breathing, nonlabored ventilation and respiratory function stable Cardiovascular status: blood pressure returned to baseline and stable Postop Assessment: no apparent nausea or vomiting Anesthetic complications: no   No notable events documented.  Last Vitals:  Vitals:   11/18/22 1130 11/18/22 1145  BP: 137/74 136/79  Pulse: 65 61  Resp: 11 13  Temp:  36.7 C  SpO2: 97% 97%    Last Pain:  Vitals:   11/18/22 1130  TempSrc:   PainSc: [place]

## 2022-11-18 NOTE — Anesthesia Preprocedure Evaluation (Addendum)
Anesthesia Evaluation  Patient identified by MRN, date of birth, ID band Patient awake    Reviewed: Allergy & Precautions, NPO status , [name] Chart, lab work & pertinent test results  Airway Mallampati: II  TM Distance: >3 FB Neck ROM: Full    Dental no notable dental hx. (+) Teeth Intact, Dental Advisory Given   Pulmonary asthma    Pulmonary exam normal breath sounds clear to auscultation       Cardiovascular Normal cardiovascular exam+ dysrhythmias Atrial Fibrillation  Rhythm:Regular Rate:Normal     Neuro/Psych negative neurological ROS  negative psych ROS   GI/Hepatic   Endo/Other  Hypothyroidism    Renal/GU negative Renal ROSLab Results      Component                Value               Date                      CREATININE               0.89                10/13/2022                BUN                      14                  10/13/2022                NA                       137                 10/13/2022                K                        4.1                 10/13/2022                CL                       102                 10/13/2022                CO2                      25                  10/13/2022                Musculoskeletal   Abdominal   Peds  Hematology Lab Results      Component                Value               Date                      WBC                      11.1 (H)  10/13/2022                HGB                      12.6                10/13/2022                HCT                      39.7                10/13/2022                MCV                      94.1                10/13/2022                PLT                      350                 10/13/2022               Anesthesia Other Findings All: PCN Breast CA  Reproductive/Obstetrics                             Anesthesia Physical Anesthesia Plan  ASA: 3  Anesthesia Plan: General    Post-op Pain Management: Tylenol PO (pre-op)* and Precedex   Induction: Intravenous  PONV Risk Score and Plan: 4 or greater and Propofol infusion, TIVA, Treatment may vary due to age or medical condition and Ondansetron  Airway Management Planned: LMA  Additional Equipment: None  Intra-op Plan:   Post-operative Plan:   Informed Consent: I have reviewed the patients History and Physical, chart, labs and discussed the procedure including the risks, benefits and alternatives for the proposed anesthesia with the patient or authorized representative who has indicated his/her understanding and acceptance.     Dental advisory given  Plan Discussed with: CRNA, Anesthesiologist and Surgeon  Anesthesia Plan Comments: (LMA TIVA GA)       Anesthesia Quick Evaluation

## 2022-11-18 NOTE — Op Note (Signed)
Preoperative diagnosis: right breast cancer s/p lumpectomy/sn biopsy and one re-excision Postoperative diagnosis: Same as above Procedure re-excision right breast lumpectomy Surgeon: Dr. Serita Grammes Estimated blood loss: Minimal Anesthesia: General Drains: None Specimens:Right breast tissue inferior, posterior and medial margins marked short superior, long lateral double deep Complications: None Special counts correct completion Disposition recovery stable condition   Indications: 67 year old otherwise fairly healthy female who has no prior breast history. She has a family history of prostate cancer in her father and her brother who had an at age 52. She had no mass or discharge. She underwent a screening mammogram that shows C density breast tissue. She was noted to have a right breast mass. On ultrasound this was 1.4 x 0.9 x 1.2 cm. There is no axillary adenopathy on ultrasound. 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 We did lumpectomy, sn and port. She has multiple nodes positive and has elected not to proceed with alnd.  Plan for margin reexcision again after one attempt.    Procedure: After informed consent obtained she was taken to the OR.  Antibiotics were given.  SCDs were placed.  She was placed under general anesthesia without complication.  She was prepped and draped in standard sterile surgical fashion.  Surgical timeout was then performed.   I reopened the lumpectomy incision and released my sutures.  I then excised the inferior margin and the medial margin. I also removed some hard tissue posteriorly.  These were passed off.  I obtained hemostasis and then closed the breast tissue again with 2-0 vicryl. The skin was closed with 3-0 Vicryl as well as 4-0 Monocryl.  Glue and Steri-Strips were placed. She tolerated this well was extubated transferred recovery  stable.

## 2022-11-18 NOTE — Interval H&P Note (Signed)
History and Physical Interval Note:  11/18/2022 7:47 AM  Caitlin [name]  has presented today for surgery, with the diagnosis of RIGHT BREAST CANCER.  The various methods of treatment have been discussed with the patient and family. After consideration of risks, benefits and other options for treatment, the patient has consented to  Procedure(s): RE-EXCISION OF RIGHT BREAST LUMPECTOMY (Right) as a surgical intervention.  The patient's history has been reviewed, patient examined, no change in status, stable for surgery.  I have reviewed the patient's chart and labs.  Questions were answered to the patient's satisfaction.     Rolm Bookbinder

## 2022-11-18 NOTE — Transfer of Care (Signed)
Immediate Anesthesia Transfer of Care Note  Patient: ESRA BARKS  Procedure(s) Performed: RE-EXCISION OF RIGHT BREAST LUMPECTOMY (Right: Breast)  Patient Location: PACU  Anesthesia Type:General  Level of Consciousness: patient cooperative and responds to stimulation  Airway & Oxygen Therapy: Patient Spontanous Breathing  Post-op Assessment: Report given to RN and Post -op Vital signs reviewed and stable  Post vital signs: Reviewed and stable  Last Vitals:  Vitals Value Taken Time  BP 116/68 11/18/22 1106  Temp    Pulse 76 11/18/22 1108  Resp 14 11/18/22 1108  SpO2 97 % 11/18/22 1108  Vitals shown include unvalidated device data.  Last Pain:  Vitals:   11/18/22 0814  TempSrc:   PainSc: 0-No pain         Complications: No notable events documented.

## 2022-11-19 ENCOUNTER — Encounter (HOSPITAL_COMMUNITY): Payer: Self-pay | Admitting: General Surgery

## 2022-11-19 LAB — SURGICAL PATHOLOGY

## 2022-11-20 ENCOUNTER — Encounter: Payer: Self-pay | Admitting: Hematology and Oncology

## 2022-11-22 ENCOUNTER — Inpatient Hospital Stay: Payer: BC Managed Care – PPO | Admitting: Hematology and Oncology

## 2022-11-22 ENCOUNTER — Inpatient Hospital Stay: Payer: BC Managed Care – PPO

## 2022-11-23 ENCOUNTER — Encounter: Payer: Self-pay | Admitting: Hematology and Oncology

## 2022-11-24 ENCOUNTER — Ambulatory Visit: Payer: BC Managed Care – PPO

## 2022-11-24 ENCOUNTER — Telehealth: Payer: Self-pay | Admitting: Emergency Medicine

## 2022-11-24 NOTE — Telephone Encounter (Signed)
S2205, ICE COMPRESS: RANDOMIZED TRIAL OF LIMB CRYOCOMPRESSION VERSUS CONTINUOUS COMPRESSION VERSUS LOW CYCLIC COMPRESSION FOR THE PREVENTION  OF TAXANE-INDUCED PERIPHERAL NEUROPATHY  Contacted patient to follow up on MyChart message sent to Dr. Geralyn Flash office.  Patient was asking about who to follow up with in regards to ice/compression trial before her treatment.  Patient was informed about study during last visit with Dr. Lindi Adie but Research referral was not made.  Spoke to patient briefly about study and informed her that we would follow up with her closer to her Taxol start date as this is when she would be eligible.  Patient verbalized understanding, denies any questions or concerns at this time but aware to call back as needed.  Wells Guiles 'Learta Codding' Neysa Bonito, RN, BSN, [facility] Clinical Research Nurse I 11/24/22 9:29 AM

## 2022-11-26 ENCOUNTER — Encounter: Payer: Self-pay | Admitting: *Deleted

## 2022-11-26 ENCOUNTER — Telehealth: Payer: Self-pay | Admitting: Hematology and Oncology

## 2022-11-26 NOTE — Telephone Encounter (Signed)
Scheduled appointment per WQ. Informed the patient that I was able to get her injection appointments scheduled at [facility] due to them being closed. Only scheduled 1 injection at this time until I'm able to hear back from the patient.

## 2022-11-29 ENCOUNTER — Ambulatory Visit: Payer: BC Managed Care – PPO | Admitting: Hematology and Oncology

## 2022-11-29 ENCOUNTER — Ambulatory Visit: Payer: BC Managed Care – PPO | Admitting: Physical Therapy

## 2022-11-29 ENCOUNTER — Other Ambulatory Visit: Payer: BC Managed Care – PPO

## 2022-12-01 ENCOUNTER — Encounter: Payer: Self-pay | Admitting: Physical Therapy

## 2022-12-01 ENCOUNTER — Ambulatory Visit: Payer: BC Managed Care – PPO | Attending: General Surgery | Admitting: Physical Therapy

## 2022-12-01 DIAGNOSIS — M25611 Stiffness of right shoulder, not elsewhere classified: Secondary | ICD-10-CM | POA: Insufficient documentation

## 2022-12-01 DIAGNOSIS — Z170 Estrogen receptor positive status [ER+]: Secondary | ICD-10-CM | POA: Diagnosis present

## 2022-12-01 DIAGNOSIS — Z483 Aftercare following surgery for neoplasm: Secondary | ICD-10-CM | POA: Diagnosis present

## 2022-12-01 DIAGNOSIS — C50411 Malignant neoplasm of upper-outer quadrant of right female breast: Secondary | ICD-10-CM | POA: Diagnosis present

## 2022-12-01 DIAGNOSIS — R293 Abnormal posture: Secondary | ICD-10-CM | POA: Diagnosis present

## 2022-12-01 MED FILL — Fosaprepitant Dimeglumine For IV Infusion 150 MG (Base Eq): INTRAVENOUS | Qty: 5 | Status: AC

## 2022-12-01 MED FILL — Dexamethasone Sodium Phosphate Inj 100 MG/10ML: INTRAMUSCULAR | Qty: 1 | Status: AC

## 2022-12-01 NOTE — Therapy (Addendum)
OUTPATIENT PHYSICAL THERAPY BREAST CANCER POST OP FOLLOW UP   Patient Name: Caitlin [name] MRN: [id number] DOB:[birth year], 67 y.o., female Today's Date: 12/01/2022  END OF SESSION:  PT End of Session - 12/01/22 1001     Visit Number 2    Number of Visits 6    Date for PT Re-Evaluation 12/29/22    PT Start Time 0908    PT Stop Time 0955    PT Time Calculation (min) 47 min    Activity Tolerance Patient tolerated treatment well    Behavior During Therapy [facility] for tasks assessed/performed             Past Medical History:  Diagnosis Date   Asthma    Dyspnea    History of kidney stones    Hypothyroidism    rt breast ca 08/2022   Right Breast   Scoliosis    Thyroid disease    Past Surgical History:  Procedure Laterality Date   APPENDECTOMY     AXILLARY SENTINEL NODE BIOPSY Right 10/14/2022   Procedure: RIGHT AXILLARY SENTINEL NODE BIOPSY;  Surgeon: Rolm Bookbinder, MD;  Location: [place];  Service: General;  Laterality: Right;   BREAST BIOPSY Right 09/15/2022   [place] RT BREAST BX W LOC DEV 1ST LESION IMG BX [place] US GUIDE 09/15/2022 [facility] MAMMOGRAPHY   BREAST BIOPSY  10/12/2022   [place] RT RADIOACTIVE SEED LOC 10/12/2022 [facility] MAMMOGRAPHY   BREAST LUMPECTOMY WITH RADIOACTIVE SEED AND SENTINEL LYMPH NODE BIOPSY Right 10/14/2022   Procedure: RIGHT BREAST LUMPECTOMY WITH RADIOACTIVE SEED;  Surgeon: Rolm Bookbinder, MD;  Location: [place];  Service: General;  Laterality: Right;   COLONOSCOPY     LAPAROSCOPIC APPENDECTOMY     PORTACATH PLACEMENT Left 10/14/2022   Procedure: INSERTION PORT-A-CATH;  Surgeon: Rolm Bookbinder, MD;  Location: [place];  Service: General;  Laterality: Left;   RE-EXCISION OF BREAST LUMPECTOMY Right 11/08/2022   Procedure: RE-EXCISION OF RIGHT BREAST LUMPECTOMY;  Surgeon: Rolm Bookbinder, MD;  Location: [place];  Service: General;  Laterality: Right;   RE-EXCISION OF BREAST LUMPECTOMY Right 11/18/2022   Procedure: RE-EXCISION OF  RIGHT BREAST LUMPECTOMY;  Surgeon: Rolm Bookbinder, MD;  Location: [place];  Service: General;  Laterality: Right;   [place] EXTRACTION     Patient Active Problem List   Diagnosis Date Noted   Genetic testing 10/11/2022   Appendicitis with peritonitis 09/29/2022   Family history of prostate cancer 09/29/2022   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female, estrogen receptor positive ([place]) 09/22/2022   Atrial fibrillation with RVR ([place]) 11/30/2010   Chronic anticoagulation 11/30/2010   Hypothyroidism 11/30/2010   PALPITATIONS 11/27/2010    PCP: Caryl Bis, MD  REFERRING PROVIDER: Rolm Bookbinder, MD  REFERRING DIAG: [phone] (ICD-10-CM) - 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female, estrogen receptor positive ([place])   THERAPY DIAG:  Stiffness of right shoulder, not elsewhere classified  Aftercare following surgery for neoplasm  Abnormal posture  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female, estrogen receptor positive ([place])  Rationale for Evaluation and Treatment: Rehabilitation  ONSET DATE: 09/15/22  SUBJECTIVE:  SUBJECTIVE STATEMENT: I feel like the arm is moving really well.   PERTINENT HISTORY:  Patient was diagnosed on 09/15/22 with right grade 3. It measures 1.4 x 0.9 x 1.2 cm and is located in the upper-outer quadrant. It is ER+, PR-, HER2- with a Ki67 of 30%. Plan is to undergo a R lumpectomy and SLNB on 10/14/22 followed by chemo and then radiation.  10/14/22: R breast lumpectomy 5/5, 11/08/22: Re excision of R lumpectomy, 11/18/22 - Re excision of R lumpectomy, will begin chemo and then possibly will need a mastectomy  PATIENT GOALS:  Reassess how my recovery is going related to arm function, pain, and  swelling.  PAIN:  Are you having pain? No  PRECAUTIONS: Recent Surgery, right UE Lymphedema risk,   ACTIVITY LEVEL / LEISURE: has recently begun doing post op exercises   OBJECTIVE:   PATIENT SURVEYS:  QUICK DASH:  Quick Dash - 12/01/22 0001     Open a tight or new jar Mild difficulty    Do heavy household chores (wash walls, wash floors) Mild difficulty    Carry a shopping bag or briefcase Mild difficulty    Wash your back Mild difficulty    Use a knife to cut food No difficulty    Recreational activities in which you take some force or impact through your arm, shoulder, or hand (golf, hammering, tennis) Mild difficulty    During the past week, to what extent has your arm, shoulder or hand problem interfered with your normal social activities with family, friends, neighbors, or groups? Slightly    During the past week, to what extent has your arm, shoulder or hand problem limited your work or other regular daily activities Slightly    Arm, shoulder, or hand pain. Mild    Tingling (pins and needles) in your arm, shoulder, or hand None    Difficulty Sleeping No difficulty    DASH Score 18.18 %              OBSERVATIONS: Healing lumpectomy scar with stitches and steri strips  POSTURE:  Forward head and rounded shoulders posture   UPPER EXTREMITY AROM/PROM:   A/PROM RIGHT   eval   RIGHT 12/01/22  Shoulder extension 81 74  Shoulder flexion 157 136  Shoulder abduction 165 141  Shoulder internal rotation 64 80  Shoulder external rotation 62 73                          (Blank rows = not tested)   A/PROM LEFT   eval  Shoulder extension 64  Shoulder flexion 167  Shoulder abduction 160  Shoulder internal rotation 54  Shoulder external rotation 81                          (Blank rows = not tested)   CERVICAL AROM: All within normal limits:      Percent limited  Flexion WFL  Extension 25%  Right lateral flexion WFL  Left lateral flexion WFL  Right rotation  WFL  Left rotation WFL      UPPER EXTREMITY STRENGTH: 5/5   LYMPHEDEMA ASSESSMENTS:    LANDMARK RIGHT   eval RIGHT 12/01/22  10 cm proximal to olecranon process 27.3 27.5  Olecranon process 23.3 23.5  10 cm proximal to ulnar styloid process 20.8 20.5  Just proximal to ulnar styloid process 15.5 15.5  Across hand at thumb web space 20.2 20.5  At base of 2nd digit 6.4 6.5  (Blank rows = not tested)   LANDMARK LEFT   eval  10 cm proximal to olecranon process 28  Olecranon process 23.8  10 cm proximal to ulnar styloid process 20  Just proximal to ulnar styloid process 15  Across hand at thumb web space 18.5  At base of 2nd digit 6.1  (Blank rows = not tested)  Surgery type/Date: 10/14/22: R breast lumpectomy 5/5, 11/08/22: Re excision of R lumpectomy, 11/18/22 - Re excision of R lumpectomy Number of lymph nodes removed: 5 nodes - all positive Current/past treatment (chemo, radiation, hormone therapy): will begin chemo on 12/02/22, possibly followed by surgery (most likely mastectomy) then radiation  Other symptoms:  Heaviness/tightness Yes tightness Pain No Pitting edema No Infections No Decreased scar mobility Yes still healing Stemmer sign No   TREATMENT PERFORMED: 12/01/22: pulleys in direction of flexion and abduction x 2 min each, PROM to R shoulder in to flexion and abduction  PATIENT EDUCATION:  Education details: ABC class, importance of walking to decrease fatigue during chemp/radiation, post op exercises, cording, scar massage Person educated: Patient Education method: Explanation Education comprehension: verbalized understanding  HOME EXERCISE PROGRAM: Reviewed previously given post op HEP. Walking program  ASSESSMENT:  CLINICAL IMPRESSION: Pt returns to PT after undergoing a R breast lumpectomy and SLNB 5/5 nodes on 10/14/22 , then on 11/08/22: Re excision of R lumpectomy, 11/18/22 - Re excision of R lumpectomy. Pt did not have clear margins after second  reexcision. She will begin chemo and then possibly will need a mastectomy followed by radiation. Her R shoulder ROM is limited in direction of flexion and abduction. She would benefit from skilled PT services to improve R shoulder ROM so she can return to her prior level of function.   Pt will benefit from skilled therapeutic intervention to improve on the following deficits: Decreased knowledge of precautions, impaired UE functional use, pain, decreased ROM, postural dysfunction.   PT treatment/interventions: ADL/Self care home management, Therapeutic exercises, Therapeutic activity, Patient/Family education, Self Care, Joint mobilization, Manual lymph drainage, Compression bandaging, scar mobilization, Taping, Vasopneumatic device, Manual therapy, and Re-evaluation   GOALS: Goals reviewed with patient? Yes  LONG TERM GOALS:  (STG=LTG)  GOALS Name Target Date  Goal status  1 Pt will demonstrate she has regained full shoulder ROM and function post operatively compared to baselines.  Baseline: 12/29/22 NEW  2 Pt will be independent a home exercise program for long term strengthening and stretching. 12/29/22 NEW  3 Pt will be able to verbalize lymphedema risk reduction practices  12/29/22 NEW     PLAN:  PT FREQUENCY/DURATION: 1x/wk for 4 wks   PLAN FOR NEXT SESSION: pulleys, ball, PROM to R shoulder, eventually supine scap   [facility]  [address], [address]  [place] [address]  [phone]  After Breast Cancer Class It is recommended you attend the ABC class to be educated on lymphedema risk reduction. This class is free of charge and lasts for 1 hour. It is a 1-time class. You will need to download the Webex app either on your phone or computer. We will send you a link the night before or the morning of the class. You should be able to click on that link to join the class. This is not a confidential class. You don't have to turn your camera on, but other  participants may be able to see your email address.  Scar massage You can begin  gentle scar massage to you incision sites. Gently place one hand on the incision and move the skin (without sliding on the skin) in various directions. Do this for a few minutes and then you can gently massage either coconut oil or vitamin E cream into the scars.  Compression garment You should continue wearing your compression bra until you feel like you no longer have swelling.  Home exercise Program Continue doing the exercises you were given until you feel like you can do them without feeling any tightness at the end.   Walking Program Studies show that 30 minutes of walking per day (fast enough to elevate your heart rate) can significantly reduce the risk of a cancer recurrence. If you can't walk due to other medical reasons, we encourage you to find another activity you could do (like a stationary bike or water exercise).  Posture After breast cancer surgery, people frequently sit with rounded shoulders posture because it puts their incisions on slack and feels better. If you sit like this and scar tissue forms in that position, you can become very tight and have pain sitting or standing with good posture. Try to be aware of your posture and sit and stand up tall to heal properly.  Follow up PT: It is recommended you return every 3 months for the first 3 years following surgery to be assessed on the SOZO machine for an L-Dex score. This helps prevent clinically significant lymphedema in 95% of patients. These follow up screens are 10 minute appointments that you are not billed for.  [employer], PT 12/01/2022, 10:02 AM

## 2022-12-01 NOTE — Progress Notes (Signed)
Patient Care Team: Caitlin Bis, MD as PCP - General (Unknown Physician Specialty) Mauro Kaufmann, RN as Oncology Nurse Navigator Rockwell Germany, RN as Oncology Nurse Navigator Nicholas Lose, MD as Consulting Physician (Hematology and Oncology)  DIAGNOSIS: No diagnosis found.  SUMMARY OF ONCOLOGIC HISTORY: Oncology History  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female, estrogen receptor positive ([place])  09/15/2022 Initial Diagnosis   Screening mammogram detected right breast mass which measured 1.4 cm by ultrasound at 10 o'clock position, biopsy revealed grade 3 IDC with lymphovascular invasion ER 10%, PR 0%, Ki-67 30%, HER2 0 negative   09/25/2022 Cancer Staging   Staging form: Breast, AJCC 8th Edition - Clinical: Stage IB (cT1c, cN0, cM0, G3, ER+, PR-, HER2-) - Signed by Nicholas Lose, MD on 09/25/2022 Histologic grading system: 3 grade system    Genetic Testing   [facility] CancerNext-Expanded Panel+RNA was Negative. Report date is 10/06/2021.  The CancerNext-Expanded gene panel offered by [facility] and includes sequencing, rearrangement, and RNA analysis for the following 77 genes: AIP, ALK, APC, ATM, AXIN2, BAP1, BARD1, BLM, BMPR1A, BRCA1, BRCA2, BRIP1, CDC73, CDH1, CDK4, CDKN1B, CDKN2A, CHEK2, CTNNA1, DICER1, FANCC, FH, FLCN, GALNT12, KIF1B, LZTR1, MAX, MEN1, MET, MLH1, MSH2, MSH3, MSH6, MUTYH, NBN, NF1, NF2, NTHL1, PALB2, PHOX2B, PMS2, POT1, PRKAR1A, PTCH1, PTEN, RAD51C, RAD51D, RB1, RECQL, RET, SDHA, SDHAF2, SDHB, SDHC, SDHD, SMAD4, SMARCA4, SMARCB1, SMARCE1, STK11, SUFU, TMEM127, TP53, TSC1, TSC2, VHL and XRCC2 (sequencing and deletion/duplication); EGFR, EGLN1, HOXB13, KIT, MITF, PDGFRA, POLD1, and POLE (sequencing only); EPCAM and GREM1 (deletion/duplication only).    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 with high-grade DCIS, inferior margin positive,  angiolymphatic invasion present, 5/5 lymph nodes positive, additional medial margin: Poorly differentiated carcinoma with lymphoid stroma, additional inferior margin: Positive, ER 10% weak, PR 0%, HER2 0, Ki-67 30%   10/21/2022 Cancer Staging   Staging form: Breast, AJCC 8th Edition - Pathologic: Stage IIIC (pT1c, pN2a, cM0, G3, ER-, PR-, HER2-) - Signed by Nicholas Lose, MD on 10/21/2022 Stage prefix: Initial diagnosis Histologic grading system: 3 grade system   12/02/2022 -  Chemotherapy   Patient is on Treatment Plan : BREAST ADJUVANT DOSE DENSE AC q14d / PACLitaxel q7d       CHIEF COMPLIANT:   INTERVAL HISTORY: ARMINE JAEN is a   ALLERGIES:  is allergic to penicillins.  MEDICATIONS:  Current Outpatient Medications  Medication Sig Dispense Refill   acetaminophen (TYLENOL) 500 MG tablet Take 500 mg by mouth every 6 (six) hours as needed for moderate pain.     albuterol (VENTOLIN HFA) 108 (90 Base) MCG/ACT inhaler Inhale 2 puffs into the lungs every 4 (four) hours as needed for shortness of breath (Asthma).     aspirin 81 MG tablet Take 81 mg by mouth daily.       benzonatate (TESSALON) 200 MG capsule Take 200 mg by mouth daily as needed for cough.     dexamethasone (DECADRON) 4 MG tablet Take 2 tablets (8 mg total) by mouth daily for 3 days. Start the day after doxorubicin/cyclophosphamide chemotherapy. Take with food. 30 tablet 1   diltiazem (CARDIZEM CD) 120 MG 24 hr capsule Take 120 mg by mouth daily.     levothyroxine (SYNTHROID) 75 MCG tablet Take 75 mcg by mouth daily before breakfast.     lidocaine-prilocaine (EMLA) cream Apply to affected area once 30 g 3   Multiple Vitamins-Minerals (MULTIVITAMIN WITH MINERALS)  tablet Take 1 tablet by mouth daily.     ondansetron (ZOFRAN) 8 MG tablet Take 1 tab (8 mg) by mouth every 8 hrs as needed for nausea/vomiting. Start third day after doxorubicin/cyclophosphamide chemotherapy. 30 tablet 1   OVER THE COUNTER MEDICATION Take 1  capsule by mouth 2 (two) times daily. copaiba softgels     prochlorperazine (COMPAZINE) 10 MG tablet Take 1 tablet (10 mg total) by mouth every 6 (six) hours as needed for nausea or vomiting. 30 tablet 1   PULMICORT FLEXHALER 180 MCG/ACT inhaler Inhale 1 puff into the lungs daily.     traMADol (ULTRAM) 50 MG tablet Take 1 tablet (50 mg total) by mouth every 6 (six) hours as needed. (Patient not taking: Reported on 11/16/2022) 10 tablet 0   triamcinolone cream (KENALOG) 0.1 % Apply 1 Application topically daily as needed (Dry skin).     No current facility-administered medications for this visit.    PHYSICAL EXAMINATION: ECOG PERFORMANCE STATUS: {CHL ONC ECOG PS:[phone]}  There were no vitals filed for this visit. There were no vitals filed for this visit.  BREAST:*** No palpable masses or nodules in either right or left breasts. No palpable axillary supraclavicular or infraclavicular adenopathy no breast tenderness or nipple discharge. (exam performed in the presence of a chaperone)  LABORATORY DATA:  I have reviewed the data as listed    Latest Ref Rng & Units 10/13/2022    9:30 AM  CMP  Glucose 70 - 99 mg/dL 89   BUN 8 - 23 mg/dL 14   Creatinine 0.44 - 1.00 mg/dL 0.89   Sodium 135 - 145 mmol/L 137   Potassium 3.5 - 5.1 mmol/L 4.1   Chloride 98 - 111 mmol/L 102   CO2 22 - 32 mmol/L 25   Calcium 8.9 - 10.3 mg/dL 8.9     Lab Results  Component Value Date   WBC 11.1 (H) 10/13/2022   HGB 12.6 10/13/2022   HCT 39.7 10/13/2022   MCV 94.1 10/13/2022   PLT 350 10/13/2022    ASSESSMENT & PLAN:  No problem-specific Assessment & Plan notes found for this encounter.    No orders of the defined types were placed in this encounter.  The patient has a good understanding of the overall plan. she agrees with it. she will call with any problems that may develop before the next visit here. Total time spent: 30 mins including face to face time and time spent for planning, charting and  co-ordination of care   Suzzette Righter, [place] 12/01/22    I Gardiner Coins am acting as a Education administrator for [employer]  ***

## 2022-12-02 ENCOUNTER — Other Ambulatory Visit: Payer: Self-pay

## 2022-12-02 ENCOUNTER — Inpatient Hospital Stay (HOSPITAL_BASED_OUTPATIENT_CLINIC_OR_DEPARTMENT_OTHER): Payer: BC Managed Care – PPO | Admitting: Hematology and Oncology

## 2022-12-02 ENCOUNTER — Encounter: Payer: Self-pay | Admitting: *Deleted

## 2022-12-02 ENCOUNTER — Inpatient Hospital Stay: Payer: BC Managed Care – PPO

## 2022-12-02 VITALS — BP 122/77 | HR 79 | Temp 98.4°F | Resp 16

## 2022-12-02 VITALS — BP 146/91 | HR 79 | Temp 97.2°F | Resp 18 | Ht 63.0 in | Wt 155.6 lb

## 2022-12-02 DIAGNOSIS — C50411 Malignant neoplasm of upper-outer quadrant of right female breast: Secondary | ICD-10-CM | POA: Diagnosis not present

## 2022-12-02 DIAGNOSIS — Z170 Estrogen receptor positive status [ER+]: Secondary | ICD-10-CM | POA: Diagnosis not present

## 2022-12-02 DIAGNOSIS — Z5111 Encounter for antineoplastic chemotherapy: Secondary | ICD-10-CM | POA: Diagnosis not present

## 2022-12-02 LAB — CBC WITH DIFFERENTIAL (CANCER CENTER ONLY)
Abs Immature Granulocytes: 0.03 10*3/uL (ref 0.00–0.07)
Basophils Absolute: 0.1 10*3/uL (ref 0.0–0.1)
Basophils Relative: 1 %
Eosinophils Absolute: 0.5 10*3/uL (ref 0.0–0.5)
Eosinophils Relative: 7 %
HCT: 37.1 % (ref 36.0–46.0)
Hemoglobin: 12.6 g/dL (ref 12.0–15.0)
Immature Granulocytes: 0 %
Lymphocytes Relative: 29 %
Lymphs Abs: 2.1 10*3/uL (ref 0.7–4.0)
MCH: 31 pg (ref 26.0–34.0)
MCHC: 34 g/dL (ref 30.0–36.0)
MCV: 91.2 fL (ref 80.0–100.0)
Monocytes Absolute: 0.5 10*3/uL (ref 0.1–1.0)
Monocytes Relative: 7 %
Neutro Abs: 4.2 10*3/uL (ref 1.7–7.7)
Neutrophils Relative %: 56 %
Platelet Count: 302 10*3/uL (ref 150–400)
RBC: 4.07 MIL/uL (ref 3.87–5.11)
RDW: 13.3 % (ref 11.5–15.5)
WBC Count: 7.4 10*3/uL (ref 4.0–10.5)
nRBC: 0 % (ref 0.0–0.2)

## 2022-12-02 LAB — CMP (CANCER CENTER ONLY)
ALT: 34 U/L (ref 0–44)
AST: 20 U/L (ref 15–41)
Albumin: 4.3 g/dL (ref 3.5–5.0)
Alkaline Phosphatase: 88 U/L (ref 38–126)
Anion gap: 8 (ref 5–15)
BUN: 18 mg/dL (ref 8–23)
CO2: 25 mmol/L (ref 22–32)
Calcium: 8.9 mg/dL (ref 8.9–10.3)
Chloride: 104 mmol/L (ref 98–111)
Creatinine: 0.72 mg/dL (ref 0.44–1.00)
GFR, Estimated: >60 mL/min (ref >60)
Glucose, Bld: 127 mg/dL — ABNORMAL HIGH (ref 70–99)
Potassium: 4.1 mmol/L (ref 3.5–5.1)
Sodium: 137 mmol/L (ref 135–145)
Total Bilirubin: 0.4 mg/dL (ref 0.3–1.2)
Total Protein: 6.5 g/dL (ref 6.5–8.1)

## 2022-12-02 MED ORDER — HEPARIN SOD (PORK) LOCK FLUSH 100 UNIT/ML IV SOLN
500.0000 [IU] | Freq: Once | INTRAVENOUS | Status: AC | PRN
  Administered 2022-12-02: 500 [IU]

## 2022-12-02 MED ORDER — SODIUM CHLORIDE 0.9% FLUSH
10.0000 mL | INTRAVENOUS | Status: DC | PRN
  Administered 2022-12-02: 10 mL

## 2022-12-02 MED ORDER — PALONOSETRON HCL INJECTION 0.25 MG/5ML
0.2500 mg | Freq: Once | INTRAVENOUS | Status: AC
  Administered 2022-12-02: 0.25 mg via INTRAVENOUS
  Filled 2022-12-02: qty 5

## 2022-12-02 MED ORDER — SODIUM CHLORIDE 0.9% FLUSH
10.0000 mL | INTRAVENOUS | Status: DC | PRN
  Administered 2022-12-02: 10 mL via INTRAVENOUS

## 2022-12-02 MED ORDER — DOXORUBICIN HCL CHEMO IV INJECTION 2 MG/ML
60.0000 mg/m2 | Freq: Once | INTRAVENOUS | Status: AC
  Administered 2022-12-02: 106 mg via INTRAVENOUS
  Filled 2022-12-02: qty 53

## 2022-12-02 MED ORDER — SODIUM CHLORIDE 0.9 % IV SOLN
150.0000 mg | Freq: Once | INTRAVENOUS | Status: AC
  Administered 2022-12-02: 150 mg via INTRAVENOUS
  Filled 2022-12-02: qty 150

## 2022-12-02 MED ORDER — SODIUM CHLORIDE 0.9 % IV SOLN
10.0000 mg | Freq: Once | INTRAVENOUS | Status: AC
  Administered 2022-12-02: 10 mg via INTRAVENOUS
  Filled 2022-12-02: qty 10

## 2022-12-02 MED ORDER — SODIUM CHLORIDE 0.9 % IV SOLN
600.0000 mg/m2 | Freq: Once | INTRAVENOUS | Status: AC
  Administered 2022-12-02: 1000 mg via INTRAVENOUS
  Filled 2022-12-02: qty 50

## 2022-12-02 MED ORDER — SODIUM CHLORIDE 0.9 % IV SOLN: Freq: Once | INTRAVENOUS | Status: AC

## 2022-12-02 NOTE — Assessment & Plan Note (Signed)
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  No distant metastatic disease. Bone scan 10/29/2022: No evidence of bone metastasis  11/08/2022 and 11/18/2022: Margin reexcision surgeries: Final margin focal positivity Treatment plan: Adjuvant chemotherapy with dose dense Adriamycin and Cytoxan followed by Taxol  Adjuvant radiation ------------------------------------------------------------------------------------------------------------------------------------------ Current treatment: Cycle 1 day 1 dose dense Adriamycin and Cytoxan Labs reviewed, chemo education completed, chemo consent obtained, antiemetics were discussed Return to clinic in 1 week for toxicity check

## 2022-12-02 NOTE — Patient Instructions (Signed)
[name]  Discharge Instructions: Thank you for choosing [facility] to provide your oncology and hematology care.   If you have a lab appointment with the [facility], please go directly to the [facility] and check in at the registration area.   Wear comfortable clothing and clothing appropriate for easy access to any Portacath or PICC line.   We strive to give you quality time with your provider. You may need to reschedule your appointment if you arrive late (15 or more minutes).  Arriving late affects you and other patients whose appointments are after yours.  Also, if you miss three or more appointments without notifying the office, you may be dismissed from the clinic at the provider's discretion.      For prescription refill requests, have your pharmacy contact our office and allow 72 hours for refills to be completed.    Today you received the following chemotherapy and/or immunotherapy agents: Doxorubicin and Cyclophosphamide   To help prevent nausea and vomiting after your treatment, we encourage you to take your nausea medication as directed.  BELOW ARE SYMPTOMS THAT SHOULD BE REPORTED IMMEDIATELY: *FEVER GREATER THAN 100.4 F (38 C) OR HIGHER *CHILLS OR SWEATING *NAUSEA AND VOMITING THAT IS NOT CONTROLLED WITH YOUR NAUSEA MEDICATION *UNUSUAL SHORTNESS OF BREATH *UNUSUAL BRUISING OR BLEEDING *URINARY PROBLEMS (pain or burning when urinating, or frequent urination) *BOWEL PROBLEMS (unusual diarrhea, constipation, pain near the anus) TENDERNESS IN MOUTH AND THROAT WITH OR WITHOUT PRESENCE OF ULCERS (sore throat, sores in mouth, or a toothache) UNUSUAL RASH, SWELLING OR PAIN  UNUSUAL VAGINAL DISCHARGE OR ITCHING   Items with * indicate a potential emergency and should be followed up as soon as possible or go to the Emergency Department if any problems should occur.  Please show the CHEMOTHERAPY ALERT CARD or IMMUNOTHERAPY  ALERT CARD at check-in to the Emergency Department and triage nurse.  Should you have questions after your visit or need to cancel or reschedule your appointment, please contact [facility]  Dept: [phone]  and follow the prompts.  Office hours are 8:00 a.m. to 4:30 p.m. Monday - Friday. Please note that voicemails left after 4:00 p.m. may not be returned until the following business day.  We are closed weekends and major holidays. You have access to a nurse at all times for urgent questions. Please call the main number to the clinic Dept: [phone] and follow the prompts.   For any non-urgent questions, you may also contact your provider using MyChart. We now offer e-Visits for anyone 46 and older to request care online for non-urgent symptoms. For details visit [email or web address].   Also download the MyChart app! Go to the app store, search "MyChart", open the app, select [facility], and log in with your MyChart username and password.  Doxorubicin Injection What is this medication? DOXORUBICIN (dox oh ROO bi sin) treats some types of cancer. It works by slowing down the growth of cancer cells. This medicine may be used for other purposes; ask your health care provider or pharmacist if you have questions. COMMON BRAND NAME(S): Adriamycin, Adriamycin PFS, Adriamycin RDF, Rubex What should I tell my care team before I take this medication? They need to know if you have any of these conditions: Heart disease History of low blood cell levels caused by a medication Liver disease Recent or ongoing radiation An unusual or allergic reaction to doxorubicin, other medications, foods, dyes,  or preservatives If you or your partner are pregnant or trying to get pregnant Breast-feeding How should I use this medication? This medication is injected into a vein. It is given by your care team in a hospital or clinic setting. Talk to your care team about the  use of this medication in children. Special care may be needed. Overdosage: If you think you have taken too much of this medicine contact a poison control center or emergency room at once. NOTE: This medicine is only for you. Do not share this medicine with others. What if I miss a dose? Keep appointments for follow-up doses. It is important not to miss your dose. Call your care team if you are unable to keep an appointment. What may interact with this medication? 6-mercaptopurine Paclitaxel Phenytoin St. John's wort Trastuzumab Verapamil This list may not describe all possible interactions. Give your health care provider a list of all the medicines, herbs, non-prescription drugs, or dietary supplements you use. Also tell them if you smoke, drink alcohol, or use illegal drugs. Some items may interact with your medicine. What should I watch for while using this medication? Your condition will be monitored carefully while you are receiving this medication. You may need blood work while taking this medication. This medication may make you feel generally unwell. This is not uncommon as chemotherapy can affect healthy cells as well as cancer cells. Report any side effects. Continue your course of treatment even though you feel ill unless your care team tells you to stop. There is a maximum amount of this medication you should receive throughout your life. The amount depends on the medical condition being treated and your overall health. Your care team will watch how much of this medication you receive. Tell your care team if you have taken this medication before. Your urine may turn red for a few days after your dose. This is not blood. If your urine is dark or brown, call your care team. In some cases, you may be given additional medications to help with side effects. Follow all directions for their use. This medication may increase your risk of getting an infection. Call your care team for advice if  you get a fever, chills, sore throat, or other symptoms of a cold or flu. Do not treat yourself. Try to avoid being around people who are sick. This medication may increase your risk to bruise or bleed. Call your care team if you notice any unusual bleeding. Talk to your care team about your risk of cancer. You may be more at risk for certain types of cancers if you take this medication. You should make sure that you get enough Coenzyme Q10 while you are taking this medication. Discuss the foods you eat and the vitamins you take with your care team. Talk to your care team if you or your partner may be pregnant. Serious birth defects can occur if you take this medication during pregnancy and for 6 months after the last dose. Contraception is recommended while taking this medication and for 6 months after the last dose. Your care team can help you find the option that works for you. If your partner can get pregnant, use a condom while taking this medication and for 6 months after the last dose. Do not breastfeed while taking this medication. This medication may cause infertility. Talk to your care team if you are concerned about your fertility. What side effects may I notice from receiving this medication? Side effects that  you should report to your care team as soon as possible: Allergic reactions--skin rash, itching, hives, swelling of the face, lips, tongue, or throat Heart failure--shortness of breath, swelling of the ankles, feet, or hands, sudden weight gain, unusual weakness or fatigue Heart rhythm changes--fast or irregular heartbeat, dizziness, feeling faint or lightheaded, chest pain, trouble breathing Infection--fever, chills, cough, sore throat, wounds that don't heal, pain or trouble when passing urine, general feeling of discomfort or being unwell Low red blood cell level--unusual weakness or fatigue, dizziness, headache, trouble breathing Painful swelling, warmth, or redness of the skin,  blisters or sores at the infusion site Unusual bruising or bleeding Side effects that usually do not require medical attention (report to your care team if they continue or are bothersome): Diarrhea Hair loss Nausea Pain, redness, or swelling with sores inside the mouth or throat Red urine This list may not describe all possible side effects. Call your doctor for medical advice about side effects. You may report side effects to FDA at 1-800-FDA-1088. Where should I keep my medication? This medication is given in a hospital or clinic. It will not be stored at home. NOTE: This sheet is a summary. It may not cover all possible information. If you have questions about this medicine, talk to your doctor, pharmacist, or health care provider.  2023 Elsevier/Gold Standard (2022-01-27 00:00:00) Cyclophosphamide Injection What is this medication? CYCLOPHOSPHAMIDE (sye kloe FOSS fa mide) treats some types of cancer. It works by slowing down the growth of cancer cells. This medicine may be used for other purposes; ask your health care provider or pharmacist if you have questions. COMMON BRAND NAME(S): Cyclophosphamide, Cytoxan, Neosar What should I tell my care team before I take this medication? They need to know if you have any of these conditions: Heart disease Irregular heartbeat or rhythm Infection Kidney problems Liver disease Low blood cell levels (white cells, platelets, or red blood cells) Lung disease Previous radiation Trouble passing urine An unusual or allergic reaction to cyclophosphamide, other medications, foods, dyes, or preservatives Pregnant or trying to get pregnant Breast-feeding How should I use this medication? This medication is injected into a vein. It is given by your care team in a hospital or clinic setting. Talk to your care team about the use of this medication in children. Special care may be needed. Overdosage: If you think you have taken too much of this  medicine contact a poison control center or emergency room at once. NOTE: This medicine is only for you. Do not share this medicine with others. What if I miss a dose? Keep appointments for follow-up doses. It is important not to miss your dose. Call your care team if you are unable to keep an appointment. What may interact with this medication? Amphotericin B Amiodarone Azathioprine Certain antivirals for HIV or hepatitis Certain medications for blood pressure, such as enalapril, lisinopril, quinapril Cyclosporine Diuretics Etanercept Indomethacin Medications that relax muscles Metronidazole Natalizumab Tamoxifen Warfarin This list may not describe all possible interactions. Give your health care provider a list of all the medicines, herbs, non-prescription drugs, or dietary supplements you use. Also tell them if you smoke, drink alcohol, or use illegal drugs. Some items may interact with your medicine. What should I watch for while using this medication? This medication may make you feel generally unwell. This is not uncommon as chemotherapy can affect healthy cells as well as cancer cells. Report any side effects. Continue your course of treatment even though you feel ill unless your  care team tells you to stop. You may need blood work while you are taking this medication. This medication may increase your risk of getting an infection. Call your care team for advice if you get a fever, chills, sore throat, or other symptoms of a cold or flu. Do not treat yourself. Try to avoid being around people who are sick. Avoid taking medications that contain aspirin, acetaminophen, ibuprofen, naproxen, or ketoprofen unless instructed by your care team. These medications may hide a fever. Be careful brushing or flossing your teeth or using a toothpick because you may get an infection or bleed more easily. If you have any dental work done, tell your dentist you are receiving this medication. Drink  water or other fluids as directed. Urinate often, even at night. Some products may contain alcohol. Ask your care team if this medication contains alcohol. Be sure to tell all care teams you are taking this medicine. Certain medicines, like metronidazole and disulfiram, can cause an unpleasant reaction when taken with alcohol. The reaction includes flushing, headache, nausea, vomiting, sweating, and increased thirst. The reaction can last from 30 minutes to several hours. Talk to your care team if you wish to become pregnant or think you might be pregnant. This medication can cause serious birth defects if taken during pregnancy and for 1 year after the last dose. A negative pregnancy test is required before starting this medication. A reliable form of contraception is recommended while taking this medication and for 1 year after the last dose. Talk to your care team about reliable forms of contraception. Do not father a child while taking this medication and for 4 months after the last dose. Use a condom during this time period. Do not breast-feed while taking this medication or for 1 week after the last dose. This medication may cause infertility. Talk to your care team if you are concerned about your fertility. Talk to your care team about your risk of cancer. You may be more at risk for certain types of cancer if you take this medication. What side effects may I notice from receiving this medication? Side effects that you should report to your care team as soon as possible: Allergic reactions--skin rash, itching, hives, swelling of the face, lips, tongue, or throat Dry cough, shortness of breath or trouble breathing Heart failure--shortness of breath, swelling of the ankles, feet, or hands, sudden weight gain, unusual weakness or fatigue Heart muscle inflammation--unusual weakness or fatigue, shortness of breath, chest pain, fast or irregular heartbeat, dizziness, swelling of the ankles, feet, or  hands Heart rhythm changes--fast or irregular heartbeat, dizziness, feeling faint or lightheaded, chest pain, trouble breathing Infection--fever, chills, cough, sore throat, wounds that don't heal, pain or trouble when passing urine, general feeling of discomfort or being unwell Kidney injury--decrease in the amount of urine, swelling of the ankles, hands, or feet Liver injury--right upper belly pain, loss of appetite, nausea, light-colored stool, dark yellow or brown urine, yellowing skin or eyes, unusual weakness or fatigue Low red blood cell level--unusual weakness or fatigue, dizziness, headache, trouble breathing Low sodium level--muscle weakness, fatigue, dizziness, headache, confusion Red or dark brown urine Unusual bruising or bleeding Side effects that usually do not require medical attention (report to your care team if they continue or are bothersome): Hair loss Irregular menstrual cycles or spotting Loss of appetite Nausea Pain, redness, or swelling with sores inside the mouth or throat Vomiting This list may not describe all possible side effects. Call your doctor for  medical advice about side effects. You may report side effects to FDA at 1-800-FDA-1088. Where should I keep my medication? This medication is given in a hospital or clinic. It will not be stored at home. NOTE: This sheet is a summary. It may not cover all possible information. If you have questions about this medicine, talk to your doctor, pharmacist, or health care provider.  2023 Elsevier/Gold Standard (2021-11-10 00:00:00)

## 2022-12-03 ENCOUNTER — Encounter: Payer: Self-pay | Admitting: Adult Health

## 2022-12-03 ENCOUNTER — Ambulatory Visit: Payer: BC Managed Care – PPO | Admitting: Adult Health

## 2022-12-03 ENCOUNTER — Other Ambulatory Visit: Payer: BC Managed Care – PPO

## 2022-12-03 ENCOUNTER — Telehealth: Payer: Self-pay | Admitting: *Deleted

## 2022-12-03 ENCOUNTER — Encounter: Payer: Self-pay | Admitting: Hematology and Oncology

## 2022-12-03 ENCOUNTER — Ambulatory Visit: Payer: BC Managed Care – PPO

## 2022-12-03 NOTE — Telephone Encounter (Signed)
RN placed call to pt for chemo follow up.  Pt denies any side effects at this time.  RN encouraged increase p.o intake and fluid intake and to contact our office if any issues develop.  Pt verbalized understanding.

## 2022-12-04 ENCOUNTER — Inpatient Hospital Stay: Payer: BC Managed Care – PPO | Attending: Hematology and Oncology

## 2022-12-04 VITALS — BP 130/79 | HR 80 | Temp 98.1°F | Resp 16

## 2022-12-04 DIAGNOSIS — Z5111 Encounter for antineoplastic chemotherapy: Secondary | ICD-10-CM | POA: Insufficient documentation

## 2022-12-04 DIAGNOSIS — Z5189 Encounter for other specified aftercare: Secondary | ICD-10-CM | POA: Diagnosis not present

## 2022-12-04 DIAGNOSIS — Z170 Estrogen receptor positive status [ER+]: Secondary | ICD-10-CM | POA: Insufficient documentation

## 2022-12-04 DIAGNOSIS — C50411 Malignant neoplasm of upper-outer quadrant of right female breast: Secondary | ICD-10-CM | POA: Diagnosis present

## 2022-12-04 MED ORDER — PEGFILGRASTIM-CBQV 6 MG/0.6ML ~~LOC~~ SOSY
6.0000 mg | PREFILLED_SYRINGE | Freq: Once | SUBCUTANEOUS | Status: AC
  Administered 2022-12-04: 6 mg via SUBCUTANEOUS

## 2022-12-06 ENCOUNTER — Ambulatory Visit: Payer: BC Managed Care – PPO

## 2022-12-06 ENCOUNTER — Telehealth: Payer: Self-pay | Admitting: Hematology and Oncology

## 2022-12-06 ENCOUNTER — Other Ambulatory Visit: Payer: BC Managed Care – PPO

## 2022-12-06 ENCOUNTER — Ambulatory Visit: Payer: BC Managed Care – PPO | Admitting: Hematology and Oncology

## 2022-12-06 NOTE — Telephone Encounter (Signed)
Rescheduled and scheduled appointments per room/resource. Patient is aware of the changes made to her upcoming appointments.

## 2022-12-07 ENCOUNTER — Encounter: Payer: Self-pay | Admitting: Physical Therapy

## 2022-12-08 ENCOUNTER — Ambulatory Visit: Payer: BC Managed Care – PPO

## 2022-12-09 ENCOUNTER — Telehealth: Payer: Self-pay | Admitting: Adult Health

## 2022-12-09 ENCOUNTER — Inpatient Hospital Stay: Payer: BC Managed Care – PPO

## 2022-12-09 ENCOUNTER — Inpatient Hospital Stay (HOSPITAL_BASED_OUTPATIENT_CLINIC_OR_DEPARTMENT_OTHER): Payer: BC Managed Care – PPO | Admitting: Adult Health

## 2022-12-09 ENCOUNTER — Encounter: Payer: Self-pay | Admitting: Adult Health

## 2022-12-09 ENCOUNTER — Other Ambulatory Visit: Payer: Self-pay

## 2022-12-09 VITALS — BP 127/81 | HR 86 | Temp 97.5°F | Resp 16 | Wt 155.9 lb

## 2022-12-09 DIAGNOSIS — Z95828 Presence of other vascular implants and grafts: Secondary | ICD-10-CM

## 2022-12-09 DIAGNOSIS — C50411 Malignant neoplasm of upper-outer quadrant of right female breast: Secondary | ICD-10-CM | POA: Diagnosis not present

## 2022-12-09 DIAGNOSIS — Z170 Estrogen receptor positive status [ER+]: Secondary | ICD-10-CM

## 2022-12-09 DIAGNOSIS — Z5111 Encounter for antineoplastic chemotherapy: Secondary | ICD-10-CM | POA: Diagnosis not present

## 2022-12-09 LAB — CBC WITH DIFFERENTIAL (CANCER CENTER ONLY)
Abs Immature Granulocytes: 0.07 10*3/uL (ref 0.00–0.07)
Basophils Absolute: 0 10*3/uL (ref 0.0–0.1)
Basophils Relative: 0 %
Eosinophils Absolute: 0.2 10*3/uL (ref 0.0–0.5)
Eosinophils Relative: 4 %
HCT: 34.9 % — ABNORMAL LOW (ref 36.0–46.0)
Hemoglobin: 11.9 g/dL — ABNORMAL LOW (ref 12.0–15.0)
Immature Granulocytes: 1 %
Lymphocytes Relative: 30 %
Lymphs Abs: 1.5 10*3/uL (ref 0.7–4.0)
MCH: 31 pg (ref 26.0–34.0)
MCHC: 34.1 g/dL (ref 30.0–36.0)
MCV: 90.9 fL (ref 80.0–100.0)
Monocytes Absolute: 0.5 10*3/uL (ref 0.1–1.0)
Monocytes Relative: 11 %
Neutro Abs: 2.6 10*3/uL (ref 1.7–7.7)
Neutrophils Relative %: 54 %
Platelet Count: 227 10*3/uL (ref 150–400)
RBC: 3.84 MIL/uL — ABNORMAL LOW (ref 3.87–5.11)
RDW: 13.2 % (ref 11.5–15.5)
Smear Review: NORMAL
WBC Count: 4.9 10*3/uL (ref 4.0–10.5)
nRBC: 0 % (ref 0.0–0.2)

## 2022-12-09 LAB — CMP (CANCER CENTER ONLY)
ALT: 18 U/L (ref 0–44)
AST: 11 U/L — ABNORMAL LOW (ref 15–41)
Albumin: 4.1 g/dL (ref 3.5–5.0)
Alkaline Phosphatase: 88 U/L (ref 38–126)
Anion gap: 6 (ref 5–15)
BUN: 25 mg/dL — ABNORMAL HIGH (ref 8–23)
CO2: 27 mmol/L (ref 22–32)
Calcium: 9 mg/dL (ref 8.9–10.3)
Chloride: 103 mmol/L (ref 98–111)
Creatinine: 0.64 mg/dL (ref 0.44–1.00)
GFR, Estimated: >60 mL/min (ref >60)
Glucose, Bld: 91 mg/dL (ref 70–99)
Potassium: 4.3 mmol/L (ref 3.5–5.1)
Sodium: 136 mmol/L (ref 135–145)
Total Bilirubin: 0.3 mg/dL (ref 0.3–1.2)
Total Protein: 6.5 g/dL (ref 6.5–8.1)

## 2022-12-09 MED ORDER — SODIUM CHLORIDE 0.9% FLUSH
10.0000 mL | INTRAVENOUS | Status: DC | PRN
  Administered 2022-12-09: 10 mL via INTRAVENOUS

## 2022-12-09 MED ORDER — HEPARIN SOD (PORK) LOCK FLUSH 100 UNIT/ML IV SOLN
500.0000 [IU] | Freq: Once | INTRAVENOUS | Status: AC
  Administered 2022-12-09: 500 [IU] via INTRAVENOUS

## 2022-12-09 NOTE — Telephone Encounter (Signed)
Pt was here today for port flush appointment. Had an issue getting her blood flushed her port twice. Once blood was taken from the port Pt stated "I feel funny". Pt vasovagaled. Pt layed back and v/s taken. After a few minutes Pt stated she was feeling ok last vital sign taken was BP 104/62 , HR 74. Pt seen briefly by symptom management. Accompanied by charge Nurse Lexine Baton to her appointment with Wilber Bihari.

## 2022-12-09 NOTE — Progress Notes (Signed)
[facility] Follow up:    Caryl Bis, MD [place] [address]   DIAGNOSIS:  Cancer Staging  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female, estrogen receptor positive ([place]) Staging form: Breast, AJCC 8th Edition - Clinical: Stage IB (cT1c, cN0, cM0, G3, ER+, PR-, HER2-) - Signed by Nicholas Lose, MD on 09/25/2022 Histologic grading system: 3 grade system - Pathologic: Stage IIIC (pT1c, pN2a, cM0, G3, ER-, PR-, HER2-) - Signed by Nicholas Lose, MD on 10/21/2022 Stage prefix: Initial diagnosis Histologic grading system: 3 grade system   SUMMARY OF ONCOLOGIC HISTORY: Oncology History  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female, estrogen receptor positive ([place])  09/15/2022 Initial Diagnosis   Screening mammogram detected right breast mass which measured 1.4 cm by ultrasound at 10 o'clock position, biopsy revealed grade 3 IDC with lymphovascular invasion ER 10%, PR 0%, Ki-67 30%, HER2 0 negative   09/25/2022 Cancer Staging   Staging form: Breast, AJCC 8th Edition - Clinical: Stage IB (cT1c, cN0, cM0, G3, ER+, PR-, HER2-) - Signed by Nicholas Lose, MD on 09/25/2022 Histologic grading system: 3 grade system    Genetic Testing   [facility] CancerNext-Expanded Panel+RNA was Negative. Report date is 10/06/2021.  The CancerNext-Expanded gene panel offered by [facility] and includes sequencing, rearrangement, and RNA analysis for the following 77 genes: AIP, ALK, APC, ATM, AXIN2, BAP1, BARD1, BLM, BMPR1A, BRCA1, BRCA2, BRIP1, CDC73, CDH1, CDK4, CDKN1B, CDKN2A, CHEK2, CTNNA1, DICER1, FANCC, FH, FLCN, GALNT12, KIF1B, LZTR1, MAX, MEN1, MET, MLH1, MSH2, MSH3, MSH6, MUTYH, NBN, NF1, NF2, NTHL1, PALB2, PHOX2B, PMS2, POT1, PRKAR1A, PTCH1, PTEN, RAD51C, RAD51D, RB1, RECQL, RET, SDHA, SDHAF2, SDHB, SDHC, SDHD, SMAD4, SMARCA4, SMARCB1, SMARCE1, STK11, SUFU, TMEM127, TP53, TSC1, TSC2, VHL and XRCC2 (sequencing and  deletion/duplication); EGFR, EGLN1, HOXB13, KIT, MITF, PDGFRA, POLD1, and POLE (sequencing only); EPCAM and GREM1 (deletion/duplication only).    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 with high-grade DCIS, inferior margin positive, angiolymphatic invasion present, 5/5 lymph nodes positive, additional medial margin: Poorly differentiated carcinoma with lymphoid stroma, additional inferior margin: Positive, ER 10% weak, PR 0%, HER2 0, Ki-67 30%   10/21/2022 Cancer Staging   Staging form: Breast, AJCC 8th Edition - Pathologic: Stage IIIC (pT1c, pN2a, cM0, G3, ER-, PR-, HER2-) - Signed by Nicholas Lose, MD on 10/21/2022 Stage prefix: Initial diagnosis Histologic grading system: 3 grade system   11/08/2022 Surgery   Margin reexcision: Inferior margin: Grade 3 IDC, new margin positive Medial margin: Grade 3 IDC new margin widely involved   11/18/2022 Surgery   Right inferior margin reexcision: Invasive poorly differentiated adenocarcinoma grade 3 present in new inferior margin.  Medial margin reexcision: Invasive poorly differentiated adenocarcinoma grade 3 present at new medial margin, posterior margin: Poorly differentiated adenocarcinoma grade 3, margins negative   12/02/2022 -  Chemotherapy   Patient is on Treatment Plan : BREAST ADJUVANT DOSE DENSE AC q14d / PACLitaxel q7d       CURRENT THERAPY: cycle 1 day 8 Adriamycin/Cytoxan  INTERVAL HISTORY: MEILANI ROETHLISBERGER 67 y.o. female returns for follow-up after receiving her first cycle of adjuvant chemotherapy with Adriamycin and Cytoxan.  She has experienced fatigue. She had three of episodes of nausea and took the Dexamethasone and Prochlorperazine as recommended.  She vomited three times.  She experienced a headache following treatment too that was difficult to endure.  She has been drinking fluids  without difficulty.     Patient Active Problem List    Diagnosis Date Noted   Genetic testing 10/11/2022   Appendicitis with peritonitis 09/29/2022   Family history of prostate cancer 09/29/2022   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female, estrogen receptor positive ([place]) 09/22/2022   Atrial fibrillation with RVR ([place]) 11/30/2010   Chronic anticoagulation 11/30/2010   Hypothyroidism 11/30/2010   PALPITATIONS 11/27/2010    is allergic to penicillins.  MEDICAL HISTORY: Past Medical History:  Diagnosis Date   Asthma    Dyspnea    History of kidney stones    Hypothyroidism    rt breast ca 08/2022   Right Breast   Scoliosis    Thyroid disease     SURGICAL HISTORY: Past Surgical History:  Procedure Laterality Date   APPENDECTOMY     AXILLARY SENTINEL NODE BIOPSY Right 10/14/2022   Procedure: RIGHT AXILLARY SENTINEL NODE BIOPSY;  Surgeon: Rolm Bookbinder, MD;  Location: [facility];  Service: General;  Laterality: Right;   BREAST BIOPSY Right 09/15/2022   [place] RT BREAST BX W LOC DEV 1ST LESION IMG BX [facility] US GUIDE 09/15/2022 [facility]   BREAST BIOPSY  10/12/2022   [place] RT RADIOACTIVE SEED LOC 10/12/2022 [facility] MAMMOGRAPHY   BREAST LUMPECTOMY WITH RADIOACTIVE SEED AND SENTINEL LYMPH NODE BIOPSY Right 10/14/2022   Procedure: RIGHT BREAST LUMPECTOMY WITH RADIOACTIVE SEED;  Surgeon: Rolm Bookbinder, MD;  Location: [facility] OR;  Service: General;  Laterality: Right;   COLONOSCOPY     LAPAROSCOPIC APPENDECTOMY     PORTACATH PLACEMENT Left 10/14/2022   Procedure: INSERTION PORT-A-CATH;  Surgeon: Rolm Bookbinder, MD;  Location: [facility];  Service: General;  Laterality: Left;   RE-EXCISION OF BREAST LUMPECTOMY Right 11/08/2022   Procedure: RE-EXCISION OF RIGHT BREAST LUMPECTOMY;  Surgeon: Rolm Bookbinder, MD;  Location: [facility];  Service: General;  Laterality: Right;   RE-EXCISION OF BREAST LUMPECTOMY Right 11/18/2022   Procedure: RE-EXCISION OF RIGHT BREAST LUMPECTOMY;  Surgeon: Rolm Bookbinder, MD;  Location: [facility];  Service: General;  Laterality: Right;   WISDOM TOOTH EXTRACTION      SOCIAL HISTORY: Social History   Socioeconomic History   Marital status: Married    Spouse name: Not on file   Number of children: Not on file   Years of education: Not on file   Highest education level: Not on file  Occupational History   Not on file  Tobacco Use   Smoking status: Never   Smokeless tobacco: Not on file  Vaping Use   Vaping Use: Never used  Substance and Sexual Activity   Alcohol use: Not Currently   Drug use: Never   Sexual activity: Not Currently  Other Topics Concern   Not on file  Social History Narrative   Not on file   Social Determinants of Health   Financial Resource Strain: Not on file  Food Insecurity: Not on file  Transportation Needs: Not on file  Physical Activity: Not on file  Stress: Not on file  Social Connections: Not on file  Intimate Partner Violence: Not on file    FAMILY HISTORY: Family History  Problem Relation Age of Onset   Thyroid disease Mother    Prostate cancer Father 54       metastatic   Hypertension Father    Thyroid disease Sister        twin sister age 68   Asthma Sister    Prostate cancer Brother 74   Heart  attack Maternal Grandmother        died age 15's   Heart attack Maternal Grandfather        died age 39's   Clotting disorder Paternal Grandmother        cerebral hemorrage    Review of Systems  Constitutional:  Positive for fatigue. Negative for appetite change, chills, fever and unexpected weight change.  HENT:   Negative for hearing loss, lump/mass and trouble swallowing.   Eyes:  Negative for eye problems and icterus.  Respiratory:  Negative for chest tightness, cough and shortness of breath.   Cardiovascular:  Negative for chest pain, leg swelling and palpitations.  Gastrointestinal:  Positive for nausea and vomiting. Negative for abdominal distention, abdominal pain, constipation and diarrhea.   Endocrine: Negative for hot flashes.  Genitourinary:  Negative for difficulty urinating.   Musculoskeletal:  Negative for arthralgias.  Skin:  Negative for itching and rash.  Neurological:  Negative for dizziness, extremity weakness, headaches and numbness.  Hematological:  Negative for adenopathy. Does not bruise/bleed easily.  Psychiatric/Behavioral:  Negative for depression. The patient is not nervous/anxious.       PHYSICAL EXAMINATION  ECOG PERFORMANCE STATUS: 1 - Symptomatic but completely ambulatory  Vitals:   12/09/22 1024  BP: 127/81  Pulse: 86  Resp: 16  Temp: (!) 97.5 F (36.4 C)  SpO2: 98%    Physical Exam Constitutional:      General: She is not in acute distress.    Appearance: Normal appearance. She is not toxic-appearing.  HENT:     Head: Normocephalic and atraumatic.     Mouth/Throat:     Mouth: Mucous membranes are moist.     Pharynx: Oropharynx is clear. No oropharyngeal exudate or posterior oropharyngeal erythema.  Eyes:     General: No scleral icterus. Cardiovascular:     Rate and Rhythm: Normal rate and regular rhythm.     Pulses: Normal pulses.     Heart sounds: Normal heart sounds.  Pulmonary:     Effort: Pulmonary effort is normal.     Breath sounds: Normal breath sounds.  Abdominal:     General: Abdomen is flat. Bowel sounds are normal. There is no distension.     Palpations: Abdomen is soft.     Tenderness: There is no abdominal tenderness.  Musculoskeletal:        General: No swelling.     Cervical back: Neck supple.  Lymphadenopathy:     Cervical: No cervical adenopathy.  Skin:    General: Skin is warm and dry.     Findings: No rash.  Neurological:     General: No focal deficit present.     Mental Status: She is alert.  Psychiatric:        Mood and Affect: Mood normal.        Behavior: Behavior normal.     LABORATORY DATA:  CBC    Component Value Date/Time   WBC 4.9 12/09/2022 0954   WBC 11.1 (H) 10/13/2022 0930    RBC 3.84 (L) 12/09/2022 0954   HGB 11.9 (L) 12/09/2022 0954   HCT 34.9 (L) 12/09/2022 0954   PLT 227 12/09/2022 0954   MCV 90.9 12/09/2022 0954   MCH 31.0 12/09/2022 0954   MCHC 34.1 12/09/2022 0954   RDW 13.2 12/09/2022 0954   LYMPHSABS 1.5 12/09/2022 0954   MONOABS 0.5 12/09/2022 0954   EOSABS 0.2 12/09/2022 0954   BASOSABS 0.0 12/09/2022 0954    CMP     Component Value  Date/Time   NA 136 12/09/2022 0954   K 4.3 12/09/2022 0954   CL 103 12/09/2022 0954   CO2 27 12/09/2022 0954   GLUCOSE 91 12/09/2022 0954   BUN 25 (H) 12/09/2022 0954   CREATININE 0.64 12/09/2022 0954   CALCIUM 9.0 12/09/2022 0954   PROT 6.5 12/09/2022 0954   ALBUMIN 4.1 12/09/2022 0954   AST 11 (L) 12/09/2022 0954   ALT 18 12/09/2022 0954   ALKPHOS 88 12/09/2022 0954   BILITOT 0.3 12/09/2022 0954   GFRNONAA >60 12/09/2022 0954         ASSESSMENT and THERAPY PLAN:   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female, estrogen receptor positive ([facility]) Mylinh is a 67 year old woman with stage IIIc triple negative breast cancer here today for follow-up and evaluation after receiving cycle one of her adjuvant chemotherapy with Adriamycin and Cytoxan.  Taylani had a few side effects but overall is feeling better than she did earlier this week.  Nausea: She vomited twice.  She did take the antinausea medicines as directed and has not become dehydrated.  We reviewed how to take these moving forward which she understands and is doing quite well. Headache: I recommended that she take either Aleve or Advil in addition to the Tylenol if this happens with the next cycle.  An alternative consideration is to let us know because we can always get her in for Toradol or IV fluids as headaches can sometimes be a sign of dehydration.  Her vitals are stable today and do not coincide with a dehydration picture. Fatigue: This does get better with time.  We discussed energy conservation and I encouraged  her as she has for tough cycles with Adriamycin and Cytoxan and that will be followed by adjuvant Taxol which is typically easier to tolerate.  Jamayia will return in 1 week for labs, follow-up, and cycle 2 of her chemotherapy.  She knows to call between now and her next appointment should she have any questions or concerns.   All questions were answered. The patient knows to call the clinic with any problems, questions or concerns. We can certainly see the patient much sooner if necessary.  Total encounter time:30 minutes*in face-to-face visit time, chart review, lab review, care coordination, order entry, and documentation of the encounter time.    Wilber Bihari, NP 12/09/22 12:22 PM Medical Oncology and Hematology [facility] [place], [place] [address] Tel. [phone]    Fax. [phone]  *Total Encounter Time as defined by the Centers for Medicare and Medicaid Services includes, in addition to the face-to-face time of a patient visit (documented in the note above) non-face-to-face time: obtaining and reviewing outside history, ordering and reviewing medications, tests or procedures, care coordination (communications with other health care professionals or caregivers) and documentation in the medical record.

## 2022-12-09 NOTE — Patient Instructions (Signed)

## 2022-12-09 NOTE — Assessment & Plan Note (Signed)
Caitlin [name] is a 67 year old woman with stage IIIc triple negative breast cancer here today for follow-up and evaluation after receiving cycle one of her adjuvant chemotherapy with Adriamycin and Cytoxan.  Caitlin [name] had a few side effects but overall is feeling better than she did earlier this week.  Nausea: She vomited twice.  She did take the antinausea medicines as directed and has not become dehydrated.  We reviewed how to take these moving forward which she understands and is doing quite well. Headache: I recommended that she take either Aleve or Advil in addition to the Tylenol if this happens with the next cycle.  An alternative consideration is to let us know because we can always get her in for Toradol or IV fluids as headaches can sometimes be a sign of dehydration.  Her vitals are stable today and do not coincide with a dehydration picture. Fatigue: This does get better with time.  We discussed energy conservation and I encouraged her as she has for tough cycles with Adriamycin and Cytoxan and that will be followed by adjuvant Taxol which is typically easier to tolerate.  Caitlin [name] will return in 1 week for labs, follow-up, and cycle 2 of her chemotherapy.  She knows to call between now and her next appointment should she have any questions or concerns.

## 2022-12-10 ENCOUNTER — Other Ambulatory Visit: Payer: BC Managed Care – PPO

## 2022-12-10 ENCOUNTER — Ambulatory Visit: Payer: BC Managed Care – PPO | Admitting: Hematology and Oncology

## 2022-12-14 ENCOUNTER — Ambulatory Visit: Payer: BC Managed Care – PPO | Attending: General Surgery | Admitting: Physical Therapy

## 2022-12-14 ENCOUNTER — Encounter: Payer: Self-pay | Admitting: Physical Therapy

## 2022-12-14 DIAGNOSIS — M25611 Stiffness of right shoulder, not elsewhere classified: Secondary | ICD-10-CM | POA: Diagnosis present

## 2022-12-14 DIAGNOSIS — Z170 Estrogen receptor positive status [ER+]: Secondary | ICD-10-CM | POA: Insufficient documentation

## 2022-12-14 DIAGNOSIS — Z483 Aftercare following surgery for neoplasm: Secondary | ICD-10-CM | POA: Diagnosis present

## 2022-12-14 DIAGNOSIS — R293 Abnormal posture: Secondary | ICD-10-CM | POA: Diagnosis present

## 2022-12-14 DIAGNOSIS — C50411 Malignant neoplasm of upper-outer quadrant of right female breast: Secondary | ICD-10-CM | POA: Diagnosis present

## 2022-12-14 NOTE — Therapy (Signed)
OUTPATIENT PHYSICAL THERAPY BREAST CANCER POST OP FOLLOW UP   Patient Name: Caitlin [name] MRN: [id number] DOB:12-20-1955, 67 y.o., female Today's Date: 12/14/2022  END OF SESSION:  PT End of Session - 12/14/22 1110     Visit Number 3    Number of Visits 6    Date for PT Re-Evaluation 12/29/22    PT Start Time 1059    PT Stop Time 1153    PT Time Calculation (min) 54 min    Activity Tolerance Patient tolerated treatment well    Behavior During Therapy [facility] for tasks assessed/performed             Past Medical History:  Diagnosis Date   Asthma    Dyspnea    History of kidney stones    Hypothyroidism    rt breast ca 08/2022   Right Breast   Scoliosis    Thyroid disease    Past Surgical History:  Procedure Laterality Date   APPENDECTOMY     AXILLARY SENTINEL NODE BIOPSY Right 10/14/2022   Procedure: RIGHT AXILLARY SENTINEL NODE BIOPSY;  Surgeon: Rolm Bookbinder, MD;  Location: [facility];  Service: General;  Laterality: Right;   BREAST BIOPSY Right 09/15/2022   [place] RT BREAST BX W LOC DEV 1ST LESION IMG BX [place] US GUIDE 09/15/2022 [facility]MO[facility]   BREAST BIOPSY  10/12/2022   [place] RT RADIOACTIVE SEED LOC 10/12/2022 [facility]AMMOGRAPHY   BREAST LUMPECTOMY WITH RADIOACTIVE SEED AND SENTINEL LYMPH NODE BIOPSY Right 10/14/2022   Procedure: RIGHT BREAST LUMPECTOMY WITH RADIOACTIVE SEED;  Surgeon: Rolm Bookbinder, MD;  Location: [facility];  Service: General;  Laterality: Right;   COLONOSCOPY     LAPAROSCOPIC APPENDECTOMY     PORTACATH PLACEMENT Left 10/14/2022   Procedure: INSERTION PORT-A-CATH;  Surgeon: Rolm Bookbinder, MD;  Location: [place];  Service: General;  Laterality: Left;   RE-EXCISION OF BREAST LUMPECTOMY Right 11/08/2022   Procedure: RE-EXCISION OF RIGHT BREAST LUMPECTOMY;  Surgeon: Rolm Bookbinder, MD;  Location: [facility];  Service: General;  Laterality: Right;   RE-EXCISION OF BREAST LUMPECTOMY Right 11/18/2022   Procedure: RE-EXCISION OF  RIGHT BREAST LUMPECTOMY;  Surgeon: Rolm Bookbinder, MD;  Location: [place];  Service: General;  Laterality: Right;   [place] EXTRACTION     Patient Active Problem List   Diagnosis Date Noted   Genetic testing 10/11/2022   Appendicitis with peritonitis 09/29/2022   Family history of prostate cancer 09/29/2022   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female, estrogen receptor positive ([place]) 09/22/2022   Atrial fibrillation with RVR ([place]) 11/30/2010   Chronic anticoagulation 11/30/2010   Hypothyroidism 11/30/2010   PALPITATIONS 11/27/2010    PCP: Caryl Bis, MD  REFERRING PROVIDER: Rolm Bookbinder, MD  REFERRING DIAG: [phone] (ICD-10-CM) - 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female, estrogen receptor positive ([place])   THERAPY DIAG:  Stiffness of right shoulder, not elsewhere classified  Aftercare following surgery for neoplasm  Abnormal posture  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female, estrogen receptor positive ([place])  Rationale for Evaluation and Treatment: Rehabilitation  ONSET DATE: 09/15/22  SUBJECTIVE:  SUBJECTIVE STATEMENT: I got the pulleys and I have been doing them at home.   PERTINENT HISTORY:  Patient was diagnosed on 09/15/22 with right grade 3. It measures 1.4 x 0.9 x 1.2 cm and is located in the upper-outer quadrant. It is ER+, PR-, HER2- with a Ki67 of 30%. Plan is to undergo a R lumpectomy and SLNB on 10/14/22 followed by chemo and then radiation.  10/14/22: R breast lumpectomy 5/5, 11/08/22: Re excision of R lumpectomy, 11/18/22 - Re excision of R lumpectomy, will begin chemo and then possibly will need a mastectomy  PATIENT GOALS:  Reassess how my recovery is going related to arm function,  pain, and swelling.  PAIN:  Are you having pain? No  PRECAUTIONS: Recent Surgery, right UE Lymphedema risk,   ACTIVITY LEVEL / LEISURE: has recently begun doing post op exercises   OBJECTIVE:   PATIENT SURVEYS:  QUICK DASH:     OBSERVATIONS: Healing lumpectomy scar with stitches and steri strips  POSTURE:  Forward head and rounded shoulders posture   UPPER EXTREMITY AROM/PROM:   A/PROM RIGHT   eval   RIGHT 12/01/22 RIGHT 12/14/22  Shoulder extension 81 74   Shoulder flexion 157 136 161  Shoulder abduction 165 141 155 (164 with stretching)  Shoulder internal rotation 64 80   Shoulder external rotation 62 73                           (Blank rows = not tested)   A/PROM LEFT   eval  Shoulder extension 64  Shoulder flexion 167  Shoulder abduction 160  Shoulder internal rotation 54  Shoulder external rotation 81                          (Blank rows = not tested)   CERVICAL AROM: All within normal limits:      Percent limited  Flexion WFL  Extension 25%  Right lateral flexion WFL  Left lateral flexion WFL  Right rotation WFL  Left rotation WFL      UPPER EXTREMITY STRENGTH: 5/5   LYMPHEDEMA ASSESSMENTS:    LANDMARK RIGHT   eval RIGHT 12/01/22  10 cm proximal to olecranon process 27.3 27.5  Olecranon process 23.3 23.5  10 cm proximal to ulnar styloid process 20.8 20.5  Just proximal to ulnar styloid process 15.5 15.5  Across hand at thumb web space 20.2 20.5  At base of 2nd digit 6.4 6.5  (Blank rows = not tested)   LANDMARK LEFT   eval  10 cm proximal to olecranon process 28  Olecranon process 23.8  10 cm proximal to ulnar styloid process 20  Just proximal to ulnar styloid process 15  Across hand at thumb web space 18.5  At base of 2nd digit 6.1  (Blank rows = not tested)  Surgery type/Date: 10/14/22: R breast lumpectomy 5/5, 11/08/22: Re excision of R lumpectomy, 11/18/22 - Re excision of R lumpectomy Number of lymph nodes removed: 5 nodes - all  positive Current/past treatment (chemo, radiation, hormone therapy): will begin chemo on 12/02/22, possibly followed by surgery (most likely mastectomy) then radiation  Other symptoms:  Heaviness/tightness Yes tightness Pain No Pitting edema No Infections No Decreased scar mobility Yes still healing Stemmer sign No  TREATMENT PERFORMED: 12/14/22: pulleys x 2 min in direction of flexion and 2 min in direction of abduction, ball up wall x 10 reps in direction of flexion and  10 reps in direction of abduction (R) Instructed pt in supine scapular strengthening exercises and had pt return demonstrate 10 reps of each with yellow theraband as follows while therapist provided verbal and tactile cues to do correctly: narrow and wide overhead flexion, horizontal abduction, ER, and diagonals PROM to R shoulder in direction of flexion, abduction and ER while performing STM to R axilla  12/01/22: pulleys in direction of flexion and abduction x 2 min each, PROM to R shoulder in to flexion and abduction  PATIENT EDUCATION:  Education details: ABC class, importance of walking to decrease fatigue during chemp/radiation, post op exercises, cording, scar massage Person educated: Patient Education method: Explanation Education comprehension: verbalized understanding  HOME EXERCISE PROGRAM: Reviewed previously given post op HEP. Walking program  ASSESSMENT:  CLINICAL IMPRESSION: Pt has been using the pulleys at home but was not performing the abduction direction correctly so educated her in correct way to do it. Her ROM has improved greatly since last session and her flexion is back to baseline. She still has tightness with abduction so focused on that with PROM. Educated pt in supine scapular strengthening exercises and issued these as part of her HEP.   Pt will benefit from skilled therapeutic intervention to improve on the following deficits: Decreased knowledge of precautions, impaired UE functional use,  pain, decreased ROM, postural dysfunction.   PT treatment/interventions: ADL/Self care home management, Therapeutic exercises, Therapeutic activity, Patient/Family education, Self Care, Joint mobilization, Manual lymph drainage, Compression bandaging, scar mobilization, Taping, Vasopneumatic device, Manual therapy, and Re-evaluation   GOALS: Goals reviewed with patient? Yes  LONG TERM GOALS:  (STG=LTG)  GOALS Name Target Date  Goal status  1 Pt will demonstrate she has regained full shoulder ROM and function post operatively compared to baselines.  Baseline: 12/29/22 NEW  2 Pt will be independent a home exercise program for long term strengthening and stretching. 12/29/22 NEW  3 Pt will be able to verbalize lymphedema risk reduction practices  12/29/22 NEW     PLAN:  PT FREQUENCY/DURATION: 1x/wk for 4 wks   PLAN FOR NEXT SESSION: pulleys, ball, PROM to R shoulder, eventually supine scap   [facility]  [address], [address]  [place] [address]  [phone]  After Breast Cancer Class It is recommended you attend the ABC class to be educated on lymphedema risk reduction. This class is free of charge and lasts for 1 hour. It is a 1-time class. You will need to download the Webex app either on your phone or computer. We will send you a link the night before or the morning of the class. You should be able to click on that link to join the class. This is not a confidential class. You don't have to turn your camera on, but other participants may be able to see your email address.  Scar massage You can begin gentle scar massage to you incision sites. Gently place one hand on the incision and move the skin (without sliding on the skin) in various directions. Do this for a few minutes and then you can gently massage either coconut oil or vitamin E cream into the scars.  Compression garment You should continue wearing your compression bra until you feel like you no  longer have swelling.  Home exercise Program Continue doing the exercises you were given until you feel like you can do them without feeling any tightness at the end.   Walking Program Studies show that 30 minutes of walking  per day (fast enough to elevate your heart rate) can significantly reduce the risk of a cancer recurrence. If you can't walk due to other medical reasons, we encourage you to find another activity you could do (like a stationary bike or water exercise).  Posture After breast cancer surgery, people frequently sit with rounded shoulders posture because it puts their incisions on slack and feels better. If you sit like this and scar tissue forms in that position, you can become very tight and have pain sitting or standing with good posture. Try to be aware of your posture and sit and stand up tall to heal properly.  Follow up PT: It is recommended you return every 3 months for the first 3 years following surgery to be assessed on the SOZO machine for an L-Dex score. This helps prevent clinically significant lymphedema in 95% of patients. These follow up screens are 10 minute appointments that you are not billed for.  [facility], PT 12/14/2022, 12:00 PM

## 2022-12-14 NOTE — Patient Instructions (Signed)
Over Head Pull: Narrow and Wide Grip   Cancer Rehab [phone]   On back, knees bent, feet flat, band across thighs, elbows straight but relaxed. Pull hands apart (start). Keeping elbows straight, bring arms up and over head, hands toward floor. Keep pull steady on band. Hold momentarily. Return slowly, keeping pull steady, back to start. Then do same with a wider grip on the band (past shoulder width) Repeat _10__ times. Band color __yellow____   Side Pull: Double Arm   On back, knees bent, feet flat. Arms perpendicular to body, shoulder level, elbows straight but relaxed. Pull arms out to sides, elbows straight. Resistance band comes across collarbones, hands toward floor. Hold momentarily. Slowly return to starting position. Repeat _10__ times. Band color _yellow____   Sword   On back, knees bent, feet flat, left hand on left hip, right hand above left. Pull right arm DIAGONALLY (hip to shoulder) across chest. Bring right arm along head toward floor. Hold momentarily. Slowly return to starting position. Thumb is pointed down when by opposite hip and rotates back towards floor when by head "drawing sword out of pocket" Repeat _10__ times. Do with left arm. Band color _yellow_____   Shoulder Rotation: Double Arm   On back, knees bent, feet flat, elbows tucked at sides, bent 90, hands palms up. Pull hands apart and down toward floor, keeping elbows near sides. Hold momentarily. Slowly return to starting position. Repeat _10__ times. Band color __yellow____

## 2022-12-15 ENCOUNTER — Encounter: Payer: Self-pay | Admitting: Adult Health

## 2022-12-15 ENCOUNTER — Encounter: Payer: Self-pay | Admitting: Hematology and Oncology

## 2022-12-15 MED FILL — Fosaprepitant Dimeglumine For IV Infusion 150 MG (Base Eq): INTRAVENOUS | Qty: 5 | Status: AC

## 2022-12-15 MED FILL — Dexamethasone Sodium Phosphate Inj 100 MG/10ML: INTRAMUSCULAR | Qty: 1 | Status: AC

## 2022-12-15 NOTE — Progress Notes (Signed)
Called pt to introduce myself as her Financial Resource Specialist and to discuss the [employer] grant.  I left a msg requesting she return my call if she's interested in applying for the grant.  

## 2022-12-16 ENCOUNTER — Inpatient Hospital Stay (HOSPITAL_BASED_OUTPATIENT_CLINIC_OR_DEPARTMENT_OTHER): Payer: BC Managed Care – PPO | Admitting: Adult Health

## 2022-12-16 ENCOUNTER — Inpatient Hospital Stay: Payer: BC Managed Care – PPO

## 2022-12-16 ENCOUNTER — Encounter: Payer: Self-pay | Admitting: Adult Health

## 2022-12-16 VITALS — BP 106/67 | HR 73 | Resp 17

## 2022-12-16 DIAGNOSIS — C50411 Malignant neoplasm of upper-outer quadrant of right female breast: Secondary | ICD-10-CM

## 2022-12-16 DIAGNOSIS — Z5111 Encounter for antineoplastic chemotherapy: Secondary | ICD-10-CM | POA: Diagnosis not present

## 2022-12-16 DIAGNOSIS — Z95828 Presence of other vascular implants and grafts: Secondary | ICD-10-CM

## 2022-12-16 DIAGNOSIS — Z170 Estrogen receptor positive status [ER+]: Secondary | ICD-10-CM

## 2022-12-16 LAB — CMP (CANCER CENTER ONLY)
ALT: 28 U/L (ref 0–44)
AST: 16 U/L (ref 15–41)
Albumin: 4.1 g/dL (ref 3.5–5.0)
Alkaline Phosphatase: 115 U/L (ref 38–126)
Anion gap: 7 (ref 5–15)
BUN: 24 mg/dL — ABNORMAL HIGH (ref 8–23)
CO2: 27 mmol/L (ref 22–32)
Calcium: 9.3 mg/dL (ref 8.9–10.3)
Chloride: 105 mmol/L (ref 98–111)
Creatinine: 0.78 mg/dL (ref 0.44–1.00)
GFR, Estimated: >60 mL/min (ref >60)
Glucose, Bld: 100 mg/dL — ABNORMAL HIGH (ref 70–99)
Potassium: 4.2 mmol/L (ref 3.5–5.1)
Sodium: 139 mmol/L (ref 135–145)
Total Bilirubin: 0.2 mg/dL — ABNORMAL LOW (ref 0.3–1.2)
Total Protein: 6.8 g/dL (ref 6.5–8.1)

## 2022-12-16 LAB — CBC WITH DIFFERENTIAL (CANCER CENTER ONLY)
Abs Immature Granulocytes: 1.56 10*3/uL — ABNORMAL HIGH (ref 0.00–0.07)
Basophils Absolute: 0.1 10*3/uL (ref 0.0–0.1)
Basophils Relative: 1 %
Eosinophils Absolute: 0.1 10*3/uL (ref 0.0–0.5)
Eosinophils Relative: 0 %
HCT: 36 % (ref 36.0–46.0)
Hemoglobin: 11.9 g/dL — ABNORMAL LOW (ref 12.0–15.0)
Immature Granulocytes: 10 %
Lymphocytes Relative: 13 %
Lymphs Abs: 2 10*3/uL (ref 0.7–4.0)
MCH: 30.4 pg (ref 26.0–34.0)
MCHC: 33.1 g/dL (ref 30.0–36.0)
MCV: 91.8 fL (ref 80.0–100.0)
Monocytes Absolute: 1.2 10*3/uL — ABNORMAL HIGH (ref 0.1–1.0)
Monocytes Relative: 8 %
Neutro Abs: 10.5 10*3/uL — ABNORMAL HIGH (ref 1.7–7.7)
Neutrophils Relative %: 68 %
Platelet Count: 238 10*3/uL (ref 150–400)
RBC: 3.92 MIL/uL (ref 3.87–5.11)
RDW: 13.8 % (ref 11.5–15.5)
Smear Review: NORMAL
WBC Count: 15.4 10*3/uL — ABNORMAL HIGH (ref 4.0–10.5)
nRBC: 0 % (ref 0.0–0.2)

## 2022-12-16 MED ORDER — HEPARIN SOD (PORK) LOCK FLUSH 100 UNIT/ML IV SOLN
500.0000 [IU] | Freq: Once | INTRAVENOUS | Status: AC | PRN
  Administered 2022-12-16: 500 [IU]

## 2022-12-16 MED ORDER — SODIUM CHLORIDE 0.9% FLUSH
10.0000 mL | INTRAVENOUS | Status: DC | PRN
  Administered 2022-12-16: 10 mL

## 2022-12-16 MED ORDER — SODIUM CHLORIDE 0.9 % IV SOLN
600.0000 mg/m2 | Freq: Once | INTRAVENOUS | Status: AC
  Administered 2022-12-16: 1000 mg via INTRAVENOUS
  Filled 2022-12-16: qty 50

## 2022-12-16 MED ORDER — DOXORUBICIN HCL CHEMO IV INJECTION 2 MG/ML
60.0000 mg/m2 | Freq: Once | INTRAVENOUS | Status: AC
  Administered 2022-12-16: 106 mg via INTRAVENOUS
  Filled 2022-12-16: qty 53

## 2022-12-16 MED ORDER — SODIUM CHLORIDE 0.9 % IV SOLN
10.0000 mg | Freq: Once | INTRAVENOUS | Status: AC
  Administered 2022-12-16: 10 mg via INTRAVENOUS
  Filled 2022-12-16: qty 10

## 2022-12-16 MED ORDER — SODIUM CHLORIDE 0.9% FLUSH
10.0000 mL | Freq: Once | INTRAVENOUS | Status: AC
  Administered 2022-12-16: 10 mL

## 2022-12-16 MED ORDER — PALONOSETRON HCL INJECTION 0.25 MG/5ML
0.2500 mg | Freq: Once | INTRAVENOUS | Status: AC
  Administered 2022-12-16: 0.25 mg via INTRAVENOUS
  Filled 2022-12-16: qty 5

## 2022-12-16 MED ORDER — SODIUM CHLORIDE 0.9 % IV SOLN
150.0000 mg | Freq: Once | INTRAVENOUS | Status: AC
  Administered 2022-12-16: 150 mg via INTRAVENOUS
  Filled 2022-12-16: qty 150

## 2022-12-16 MED ORDER — SODIUM CHLORIDE 0.9 % IV SOLN: Freq: Once | INTRAVENOUS | Status: AC

## 2022-12-16 NOTE — Progress Notes (Signed)
Caitlin [name] Follow up:    Caitlin Bis, MD [place] [address]   DIAGNOSIS:  [name] Staging  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female, estrogen receptor positive ([place]) Staging form: Breast, AJCC 8th Edition - Clinical: Stage IB (cT1c, cN0, cM0, G3, ER+, PR-, HER2-) - Signed by Nicholas Lose, MD on 09/25/2022 Histologic grading system: 3 grade system - Pathologic: Stage IIIC (pT1c, pN2a, cM0, G3, ER-, PR-, HER2-) - Signed by Nicholas Lose, MD on 10/21/2022 Stage prefix: Initial diagnosis Histologic grading system: 3 grade system   SUMMARY OF ONCOLOGIC HISTORY: Oncology History  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female, estrogen receptor positive ([place])  09/15/2022 Initial Diagnosis   Screening mammogram detected right breast mass which measured 1.4 cm by ultrasound at 10 o'clock position, biopsy revealed grade 3 IDC with lymphovascular invasion ER 10%, PR 0%, Ki-67 30%, HER2 0 negative   09/25/2022 [name] Staging   Staging form: Breast, AJCC 8th Edition - Clinical: Stage IB (cT1c, cN0, cM0, G3, ER+, PR-, HER2-) - Signed by Nicholas Lose, MD on 09/25/2022 Histologic grading system: 3 grade system    Genetic Testing   [facility] CancerNext-Expanded Panel+RNA was Negative. Report date is 10/06/2021.  The CancerNext-Expanded gene panel offered by [facility] and includes sequencing, rearrangement, and RNA analysis for the following 77 genes: AIP, ALK, APC, ATM, AXIN2, BAP1, BARD1, BLM, BMPR1A, BRCA1, BRCA2, BRIP1, CDC73, CDH1, CDK4, CDKN1B, CDKN2A, CHEK2, CTNNA1, DICER1, FANCC, FH, FLCN, GALNT12, KIF1B, LZTR1, MAX, MEN1, MET, MLH1, MSH2, MSH3, MSH6, MUTYH, NBN, NF1, NF2, NTHL1, PALB2, PHOX2B, PMS2, POT1, PRKAR1A, PTCH1, PTEN, RAD51C, RAD51D, RB1, RECQL, RET, SDHA, SDHAF2, SDHB, SDHC, SDHD, SMAD4, SMARCA4, SMARCB1, SMARCE1, STK11, SUFU, TMEM127, TP53, TSC1, TSC2, VHL and XRCC2 (sequencing and  deletion/duplication); EGFR, EGLN1, HOXB13, KIT, MITF, PDGFRA, POLD1, and POLE (sequencing only); EPCAM and GREM1 (deletion/duplication only).    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 with high-grade DCIS, inferior margin positive, angiolymphatic invasion present, 5/5 lymph nodes positive, additional medial margin: Poorly differentiated carcinoma with lymphoid stroma, additional inferior margin: Positive, ER 10% weak, PR 0%, HER2 0, Ki-67 30%   10/21/2022 [name] Staging   Staging form: Breast, AJCC 8th Edition - Pathologic: Stage IIIC (pT1c, pN2a, cM0, G3, ER-, PR-, HER2-) - Signed by Nicholas Lose, MD on 10/21/2022 Stage prefix: Initial diagnosis Histologic grading system: 3 grade system   11/08/2022 Surgery   Margin reexcision: Inferior margin: Grade 3 IDC, new margin positive Medial margin: Grade 3 IDC new margin widely involved   11/18/2022 Surgery   Right inferior margin reexcision: Invasive poorly differentiated adenocarcinoma grade 3 present in new inferior margin.  Medial margin reexcision: Invasive poorly differentiated adenocarcinoma grade 3 present at new medial margin, posterior margin: Poorly differentiated adenocarcinoma grade 3, margins negative   12/02/2022 -  Chemotherapy   Patient is on Treatment Plan : BREAST ADJUVANT DOSE DENSE AC q14d / PACLitaxel q7d       CURRENT THERAPY: Adriamycin/Cytoxan  INTERVAL HISTORY: [name] 67 y.o. female returns for follow-up and evaluation prior to receiving her next cycle of Adriamycin and Cytoxan.  After her last cycle she did experience some mild nausea along with a headache.  For this neck cycle she plans on using her antinausea medicine in addition to ginger.  She also knows that she can take 1 extra strength Tylenol and 1 Aleve as much  is 3 times a day as needed if she has a headache with this cycle.   Patient Active Problem List    Diagnosis Date Noted   Port-A-Cath in place 12/16/2022   Genetic testing 10/11/2022   Appendicitis with peritonitis 09/29/2022   Family history of prostate [name] 09/29/2022   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female, estrogen receptor positive ([place]) 09/22/2022   Atrial fibrillation with RVR ([facility]) 11/30/2010   Chronic anticoagulation 11/30/2010   Hypothyroidism 11/30/2010   PALPITATIONS 11/27/2010    is allergic to penicillins.  MEDICAL HISTORY: Past Medical History:  Diagnosis Date   Asthma    Dyspnea    History of kidney stones    Hypothyroidism    rt breast ca 08/2022   Right Breast   Scoliosis    Thyroid disease     SURGICAL HISTORY: Past Surgical History:  Procedure Laterality Date   APPENDECTOMY     AXILLARY SENTINEL NODE BIOPSY Right 10/14/2022   Procedure: RIGHT AXILLARY SENTINEL NODE BIOPSY;  Surgeon: Rolm Bookbinder, MD;  Location: [facility];  Service: General;  Laterality: Right;   BREAST BIOPSY Right 09/15/2022   [place] RT BREAST BX W LOC DEV 1ST LESION IMG BX [place] US GUIDE 09/15/2022 [facility]   BREAST BIOPSY  10/12/2022   [place] RT RADIOACTIVE SEED LOC 10/12/2022 [facility]   BREAST LUMPECTOMY WITH RADIOACTIVE SEED AND SENTINEL LYMPH NODE BIOPSY Right 10/14/2022   Procedure: RIGHT BREAST LUMPECTOMY WITH RADIOACTIVE SEED;  Surgeon: Rolm Bookbinder, MD;  Location: [facility] OR;  Service: General;  Laterality: Right;   COLONOSCOPY     LAPAROSCOPIC APPENDECTOMY     PORTACATH PLACEMENT Left 10/14/2022   Procedure: INSERTION PORT-A-CATH;  Surgeon: Rolm Bookbinder, MD;  Location: [facility];  Service: General;  Laterality: Left;   RE-EXCISION OF BREAST LUMPECTOMY Right 11/08/2022   Procedure: RE-EXCISION OF RIGHT BREAST LUMPECTOMY;  Surgeon: Rolm Bookbinder, MD;  Location: [facility];  Service: General;  Laterality: Right;   RE-EXCISION OF BREAST LUMPECTOMY Right 11/18/2022   Procedure: RE-EXCISION OF RIGHT BREAST  LUMPECTOMY;  Surgeon: Rolm Bookbinder, MD;  Location: [facility];  Service: General;  Laterality: Right;   WISDOM TOOTH EXTRACTION      SOCIAL HISTORY: Social History   Socioeconomic History   Marital status: Married    Spouse name: Not on file   Number of children: Not on file   Years of education: Not on file   Highest education level: Not on file  Occupational History   Not on file  Tobacco Use   Smoking status: Never   Smokeless tobacco: Not on file  Vaping Use   Vaping Use: Never used  Substance and Sexual Activity   Alcohol use: Not Currently   Drug use: Never   Sexual activity: Not Currently  Other Topics Concern   Not on file  Social History Narrative   Not on file   Social Determinants of Health   Financial Resource Strain: Not on file  Food Insecurity: Not on file  Transportation Needs: Not on file  Physical Activity: Not on file  Stress: Not on file  Social Connections: Not on file  Intimate Partner Violence: Not on file    FAMILY HISTORY: Family History  Problem Relation Age of Onset   Thyroid disease Mother    Prostate [name] Father 51       metastatic   Hypertension Father    Thyroid disease Sister        twin  sister age 11   Asthma Sister    Prostate [name] Brother 76   Heart attack Maternal Grandmother        died age 28's   Heart attack Maternal Grandfather        died age 32's   Clotting disorder Paternal Grandmother        cerebral hemorrage    Review of Systems  Constitutional:  Positive for fatigue. Negative for appetite change, chills, fever and unexpected weight change.  HENT:   Negative for hearing loss, lump/mass and trouble swallowing.   Eyes:  Negative for eye problems and icterus.  Respiratory:  Negative for chest tightness, cough and shortness of breath.   Cardiovascular:  Negative for chest pain, leg swelling and palpitations.  Gastrointestinal:  Negative for abdominal distention, abdominal pain, constipation, diarrhea,  nausea and vomiting.  Endocrine: Negative for hot flashes.  Genitourinary:  Negative for difficulty urinating.   Musculoskeletal:  Negative for arthralgias.  Skin:  Negative for itching and rash.  Neurological:  Negative for dizziness, extremity weakness, headaches and numbness.  Hematological:  Negative for adenopathy. Does not bruise/bleed easily.  Psychiatric/Behavioral:  Negative for depression. The patient is not nervous/anxious.       PHYSICAL EXAMINATION  ECOG PERFORMANCE STATUS: 1 - Symptomatic but completely ambulatory  Vitals:   12/16/22 0828  BP: 116/81  Pulse: 80  Resp: 18  Temp: 97.9 F (36.6 C)  SpO2: 97%    Physical Exam Constitutional:      General: She is not in acute distress.    Appearance: Normal appearance. She is not toxic-appearing.  HENT:     Head: Normocephalic and atraumatic.  Eyes:     General: No scleral icterus. Cardiovascular:     Rate and Rhythm: Normal rate and regular rhythm.     Pulses: Normal pulses.     Heart sounds: Normal heart sounds.  Pulmonary:     Effort: Pulmonary effort is normal.     Breath sounds: Normal breath sounds.  Abdominal:     General: Abdomen is flat. Bowel sounds are normal. There is no distension.     Palpations: Abdomen is soft.     Tenderness: There is no abdominal tenderness.  Musculoskeletal:        General: No swelling.     Cervical back: Neck supple.  Lymphadenopathy:     Cervical: No cervical adenopathy.  Skin:    General: Skin is warm and dry.     Findings: No rash.  Neurological:     General: No focal deficit present.     Mental Status: She is alert.  Psychiatric:        Mood and Affect: Mood normal.        Behavior: Behavior normal.     LABORATORY DATA:  CBC    Component Value Date/Time   WBC 15.4 (H) 12/16/2022 0753   WBC 11.1 (H) 10/13/2022 0930   RBC 3.92 12/16/2022 0753   HGB 11.9 (L) 12/16/2022 0753   HCT 36.0 12/16/2022 0753   PLT 238 12/16/2022 0753   MCV 91.8 12/16/2022  0753   MCH 30.4 12/16/2022 0753   MCHC 33.1 12/16/2022 0753   RDW 13.8 12/16/2022 0753   LYMPHSABS 2.0 12/16/2022 0753   MONOABS 1.2 (H) 12/16/2022 0753   EOSABS 0.1 12/16/2022 0753   BASOSABS 0.1 12/16/2022 0753    CMP     Component Value Date/Time   NA 139 12/16/2022 0753   K 4.2 12/16/2022 0753   CL  105 12/16/2022 0753   CO2 27 12/16/2022 0753   GLUCOSE 100 (H) 12/16/2022 0753   BUN 24 (H) 12/16/2022 0753   CREATININE 0.78 12/16/2022 0753   CALCIUM 9.3 12/16/2022 0753   PROT 6.8 12/16/2022 0753   ALBUMIN 4.1 12/16/2022 0753   AST 16 12/16/2022 0753   ALT 28 12/16/2022 0753   ALKPHOS 115 12/16/2022 0753   BILITOT 0.2 (L) 12/16/2022 0753   GFRNONAA >60 12/16/2022 0753       ASSESSMENT and THERAPY PLAN:   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female, estrogen receptor positive [facility]) Caitlin [name] is a 67 year old woman with 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  Treatment plan:  1.  Surgery  2.  Adjuvant chemotherapy  3.  Adjuvant radiation therapy  1.  Side effects: Nausea-managed with antinausea medications and ginger, headache-1 Tylenol and 1 Aleve 3 times daily as needed--Toradol orders were also placed for her to receive if needed on Saturday when she returns for her Neulasta, we may need to discontinue Aloxi with her next cycle.  Caitlin [name] will return on Saturday for an injection and in 2 weeks for her next cycle of adjuvant chemotherapy.   All questions were answered. The patient knows to call the clinic with any problems, questions or concerns. We can certainly see the patient much sooner if necessary.  Total encounter time:30 minutes*in face-to-face visit time, chart review, lab review, care coordination, order entry, and documentation of the encounter  time.    Wilber Bihari, NP 12/17/22 9:58 AM Medical Oncology and Hematology [facility] [place],  [address] Tel. [phone]    Fax. [phone]  *Total Encounter Time as defined by the Centers for Medicare and Medicaid Services includes, in addition to the face-to-face time of a patient visit (documented in the note above) non-face-to-face time: obtaining and reviewing outside history, ordering and reviewing medications, tests or procedures, care coordination (communications with other health care professionals or caregivers) and documentation in the medical record.

## 2022-12-16 NOTE — Patient Instructions (Signed)
[facility]  Discharge Instructions: Thank you for choosing [facility] to provide your oncology and hematology care.   If you have a lab appointment with the Cancer Center, please go directly to the Cancer Center and check in at the registration area.   Wear comfortable clothing and clothing appropriate for easy access to any Portacath or PICC line.   We strive to give you quality time with your provider. You may need to reschedule your appointment if you arrive late (15 or more minutes).  Arriving late affects you and other patients whose appointments are after yours.  Also, if you miss three or more appointments without notifying the office, you may be dismissed from the clinic at the provider's discretion.      For prescription refill requests, have your pharmacy contact our office and allow 72 hours for refills to be completed.    Today you received the following chemotherapy and/or immunotherapy agents: doxorubicin and cyclophosphamide      To help prevent nausea and vomiting after your treatment, we encourage you to take your nausea medication as directed.  BELOW ARE SYMPTOMS THAT SHOULD BE REPORTED IMMEDIATELY: *FEVER GREATER THAN 100.4 F (38 C) OR HIGHER *CHILLS OR SWEATING *NAUSEA AND VOMITING THAT IS NOT CONTROLLED WITH YOUR NAUSEA MEDICATION *UNUSUAL SHORTNESS OF BREATH *UNUSUAL BRUISING OR BLEEDING *URINARY PROBLEMS (pain or burning when urinating, or frequent urination) *BOWEL PROBLEMS (unusual diarrhea, constipation, pain near the anus) TENDERNESS IN MOUTH AND THROAT WITH OR WITHOUT PRESENCE OF ULCERS (sore throat, sores in mouth, or a toothache) UNUSUAL RASH, SWELLING OR PAIN  UNUSUAL VAGINAL DISCHARGE OR ITCHING   Items with * indicate a potential emergency and should be followed up as soon as possible or go to the Emergency Department if any problems should occur.  Please show the CHEMOTHERAPY ALERT CARD or  IMMUNOTHERAPY ALERT CARD at check-in to the Emergency Department and triage nurse.  Should you have questions after your visit or need to cancel or reschedule your appointment, please contact [facility]  Dept: [phone]  and follow the prompts.  Office hours are 8:00 a.m. to 4:30 p.m. Monday - Friday. Please note that voicemails left after 4:00 p.m. may not be returned until the following business day.  We are closed weekends and major holidays. You have access to a nurse at all times for urgent questions. Please call the main number to the clinic Dept: [phone] and follow the prompts.   For any non-urgent questions, you may also contact your provider using MyChart. We now offer e-Visits for anyone 18 and older to request care online for non-urgent symptoms. For details visit [email or web address].   Also download the MyChart app! Go to the app store, search "MyChart", open the app, select [facility], and log in with your MyChart username and password.   

## 2022-12-17 ENCOUNTER — Other Ambulatory Visit: Payer: Self-pay | Admitting: Hematology and Oncology

## 2022-12-17 ENCOUNTER — Ambulatory Visit: Payer: BC Managed Care – PPO

## 2022-12-17 ENCOUNTER — Other Ambulatory Visit: Payer: BC Managed Care – PPO

## 2022-12-17 ENCOUNTER — Encounter: Payer: Self-pay | Admitting: Hematology and Oncology

## 2022-12-17 ENCOUNTER — Ambulatory Visit: Payer: BC Managed Care – PPO | Admitting: Adult Health

## 2022-12-17 DIAGNOSIS — Z170 Estrogen receptor positive status [ER+]: Secondary | ICD-10-CM

## 2022-12-17 NOTE — Assessment & Plan Note (Signed)
Caitlin [name] is a 67 year old woman with stage IIIc triple negative breast cancer diagnosed in December 2023 status post right lumpectomy, reexcision, now continuing on adjuvant chemotherapy with Adriamycin and Cytoxan to be followed by weekly paclitaxel.  Treatment plan:  1.  Surgery  2.  Adjuvant chemotherapy  3.  Adjuvant radiation therapy  1.  Side effects: Nausea-managed with antinausea medications and ginger, headache-1 Tylenol and 1 Aleve 3 times daily as needed--Toradol orders were also placed for her to receive if needed on Saturday when she returns for her Neulasta, we may need to discontinue Aloxi with her next cycle.  Caitlin [name] will return on Saturday for an injection and in 2 weeks for her next cycle of adjuvant chemotherapy.

## 2022-12-18 ENCOUNTER — Encounter: Payer: Self-pay | Admitting: Adult Health

## 2022-12-18 ENCOUNTER — Inpatient Hospital Stay: Payer: BC Managed Care – PPO

## 2022-12-18 VITALS — BP 122/68 | HR 77 | Temp 97.7°F | Resp 15

## 2022-12-18 DIAGNOSIS — Z5111 Encounter for antineoplastic chemotherapy: Secondary | ICD-10-CM | POA: Diagnosis not present

## 2022-12-18 DIAGNOSIS — C50411 Malignant neoplasm of upper-outer quadrant of right female breast: Secondary | ICD-10-CM

## 2022-12-18 MED ORDER — PEGFILGRASTIM-CBQV 6 MG/0.6ML ~~LOC~~ SOSY
6.0000 mg | PREFILLED_SYRINGE | Freq: Once | SUBCUTANEOUS | Status: AC
  Administered 2022-12-18: 6 mg via SUBCUTANEOUS

## 2022-12-20 ENCOUNTER — Ambulatory Visit: Payer: BC Managed Care – PPO

## 2022-12-20 ENCOUNTER — Encounter: Payer: Self-pay | Admitting: *Deleted

## 2022-12-20 ENCOUNTER — Ambulatory Visit: Payer: BC Managed Care – PPO | Admitting: Hematology and Oncology

## 2022-12-20 ENCOUNTER — Other Ambulatory Visit: Payer: BC Managed Care – PPO

## 2022-12-20 ENCOUNTER — Telehealth: Payer: Self-pay | Admitting: Hematology and Oncology

## 2022-12-20 NOTE — Telephone Encounter (Signed)
Scheduled appointment per WQ. Left voicemail. 

## 2022-12-22 ENCOUNTER — Ambulatory Visit: Payer: BC Managed Care – PPO

## 2022-12-26 ENCOUNTER — Encounter: Payer: Self-pay | Admitting: Adult Health

## 2022-12-27 ENCOUNTER — Telehealth: Payer: Self-pay | Admitting: Emergency Medicine

## 2022-12-27 NOTE — Telephone Encounter (Signed)
S2205, ICE COMPRESS: RANDOMIZED TRIAL OF LIMB CRYOCOMPRESSION VERSUS CONTINUOUS COMPRESSION VERSUS LOW CYCLIC COMPRESSION FOR THE PREVENTION  OF TAXANE-INDUCED PERIPHERAL NEUROPATHY  Called to f/u on patient's potential interest in study as we are now closer to start of treatment time with taxane.  No answer.  ROI on file.  Left VM requesting call back.  Will attempt to f/u again in next few days.  Wells Guiles 'Learta Codding' Neysa Bonito, RN, BSN, [facility] Clinical Research Nurse I 12/27/22 11:21 AM

## 2022-12-28 ENCOUNTER — Ambulatory Visit: Payer: BC Managed Care – PPO | Admitting: Physical Therapy

## 2022-12-28 ENCOUNTER — Encounter: Payer: Self-pay | Admitting: Physical Therapy

## 2022-12-28 DIAGNOSIS — R293 Abnormal posture: Secondary | ICD-10-CM

## 2022-12-28 DIAGNOSIS — Z170 Estrogen receptor positive status [ER+]: Secondary | ICD-10-CM

## 2022-12-28 DIAGNOSIS — M25611 Stiffness of right shoulder, not elsewhere classified: Secondary | ICD-10-CM | POA: Diagnosis not present

## 2022-12-28 DIAGNOSIS — Z483 Aftercare following surgery for neoplasm: Secondary | ICD-10-CM

## 2022-12-28 NOTE — Patient Instructions (Signed)

## 2022-12-28 NOTE — Therapy (Signed)
OUTPATIENT PHYSICAL THERAPY BREAST CANCER POST OP FOLLOW UP   Patient Name: Caitlin [name] MRN: [id number] DOB:1955/12/06, 67 y.o., female Today's Date: 12/28/2022  END OF SESSION:  PT End of Session - 12/28/22 0816     Visit Number 4    Number of Visits 6    PT Start Time 0804    PT Stop Time 0849    PT Time Calculation (min) 45 min    Activity Tolerance Patient tolerated treatment well    Behavior During Therapy [facility] for tasks assessed/performed             Past Medical History:  Diagnosis Date   Asthma    Dyspnea    History of kidney stones    Hypothyroidism    rt breast ca 08/2022   Right Breast   Scoliosis    Thyroid disease    Past Surgical History:  Procedure Laterality Date   APPENDECTOMY     AXILLARY SENTINEL NODE BIOPSY Right 10/14/2022   Procedure: RIGHT AXILLARY SENTINEL NODE BIOPSY;  Surgeon: Rolm Bookbinder, MD;  Location: [facility];  Service: General;  Laterality: Right;   BREAST BIOPSY Right 09/15/2022   [place] RT BREAST BX W LOC DEV 1ST LESION IMG BX SPEC US GUIDE 09/15/2022 [facility]   BREAST BIOPSY  10/12/2022   [place] RT RADIOACTIVE SEED LOC 10/12/2022 [facility]   BREAST LUMPECTOMY WITH RADIOACTIVE SEED AND SENTINEL LYMPH NODE BIOPSY Right 10/14/2022   Procedure: RIGHT BREAST LUMPECTOMY WITH RADIOACTIVE SEED;  Surgeon: Rolm Bookbinder, MD;  Location: [facility];  Service: General;  Laterality: Right;   COLONOSCOPY     LAPAROSCOPIC APPENDECTOMY     PORTACATH PLACEMENT Left 10/14/2022   Procedure: INSERTION PORT-A-CATH;  Surgeon: Rolm Bookbinder, MD;  Location: [facility];  Service: General;  Laterality: Left;   RE-EXCISION OF BREAST LUMPECTOMY Right 11/08/2022   Procedure: RE-EXCISION OF RIGHT BREAST LUMPECTOMY;  Surgeon: Rolm Bookbinder, MD;  Location: [facility];  Service: General;  Laterality: Right;   RE-EXCISION OF BREAST LUMPECTOMY Right 11/18/2022   Procedure: RE-EXCISION OF RIGHT BREAST LUMPECTOMY;  Surgeon:  Rolm Bookbinder, MD;  Location: [facility];  Service: General;  Laterality: Right;   [place] EXTRACTION     Patient Active Problem List   Diagnosis Date Noted   Port-A-Cath in place 12/16/2022   Genetic testing 10/11/2022   Appendicitis with peritonitis 09/29/2022   Family history of prostate cancer 09/29/2022   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female, estrogen receptor positive ([place]) 09/22/2022   Atrial fibrillation with RVR ([place]) 11/30/2010   Chronic anticoagulation 11/30/2010   Hypothyroidism 11/30/2010   PALPITATIONS 11/27/2010    PCP: Caryl Bis, MD  REFERRING PROVIDER: Rolm Bookbinder, MD  REFERRING DIAG: [phone] (ICD-10-CM) - 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female, estrogen receptor positive ([place])   THERAPY DIAG:  Stiffness of right shoulder, not elsewhere classified  Aftercare following surgery for neoplasm  Abnormal posture  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female, estrogen receptor positive ([place])  Rationale for Evaluation and Treatment: Rehabilitation  ONSET DATE: 09/15/22  SUBJECTIVE:  SUBJECTIVE STATEMENT: I have been doing the pulleys but I have not been as good at the band exercises.   PERTINENT HISTORY:  Patient was diagnosed on 09/15/22 with right grade 3. It measures 1.4 x 0.9 x 1.2 cm and is located in the upper-outer quadrant. It is ER+, PR-, HER2- with a Ki67 of 30%. Plan is to undergo a R lumpectomy and SLNB on 10/14/22 followed by chemo and then radiation.  10/14/22: R breast lumpectomy 5/5, 11/08/22: Re excision of R lumpectomy, 11/18/22 - Re excision of R lumpectomy, will begin chemo and then possibly will need a mastectomy  PATIENT GOALS:  Reassess how my recovery is  going related to arm function, pain, and swelling.  PAIN:  Are you having pain? No  PRECAUTIONS: Recent Surgery, right UE Lymphedema risk,   ACTIVITY LEVEL / LEISURE: has recently begun doing post op exercises   OBJECTIVE:   PATIENT SURVEYS:  QUICK DASH:     OBSERVATIONS: Healing lumpectomy scar with stitches and steri strips  POSTURE:  Forward head and rounded shoulders posture   UPPER EXTREMITY AROM/PROM:   A/PROM RIGHT   eval   RIGHT 12/01/22 RIGHT 12/14/22 RIGHT 12/28/22  Shoulder extension 81 74    Shoulder flexion 157 136 161 162  Shoulder abduction 165 141 155 (164 with stretching) 168  Shoulder internal rotation 64 80    Shoulder external rotation 62 73                            (Blank rows = not tested)   A/PROM LEFT   eval  Shoulder extension 64  Shoulder flexion 167  Shoulder abduction 160  Shoulder internal rotation 54  Shoulder external rotation 81                          (Blank rows = not tested)   CERVICAL AROM: All within normal limits:      Percent limited  Flexion WFL  Extension 25%  Right lateral flexion WFL  Left lateral flexion WFL  Right rotation WFL  Left rotation WFL      UPPER EXTREMITY STRENGTH: 5/5   LYMPHEDEMA ASSESSMENTS:    LANDMARK RIGHT   eval RIGHT 12/01/22  10 cm proximal to olecranon process 27.3 27.5  Olecranon process 23.3 23.5  10 cm proximal to ulnar styloid process 20.8 20.5  Just proximal to ulnar styloid process 15.5 15.5  Across hand at thumb web space 20.2 20.5  At base of 2nd digit 6.4 6.5  (Blank rows = not tested)   LANDMARK LEFT   eval  10 cm proximal to olecranon process 28  Olecranon process 23.8  10 cm proximal to ulnar styloid process 20  Just proximal to ulnar styloid process 15  Across hand at thumb web space 18.5  At base of 2nd digit 6.1  (Blank rows = not tested)  Surgery type/Date: 10/14/22: R breast lumpectomy 5/5, 11/08/22: Re excision of R lumpectomy, 11/18/22 - Re excision of R  lumpectomy Number of lymph nodes removed: 5 nodes - all positive Current/past treatment (chemo, radiation, hormone therapy): will begin chemo on 12/02/22, possibly followed by surgery (most likely mastectomy) then radiation  Other symptoms:  Heaviness/tightness Yes tightness Pain No Pitting edema No Infections No Decreased scar mobility Yes still healing Stemmer sign No  TREATMENT PERFORMED: 12/28/22: pulleys x 2 min in direction of flexion and 2 min in direction of  abduction, ball up wall x 10 reps in direction of flexion and 10 reps in direction of abduction (R) Reviewed supine scapular strengthening exercises and had pt demonstrate each for 10 reps with yellow theraband as follows while therapist provided verbal and tactile cues to do correctly: narrow and wide overhead flexion, horizontal abduction, ER, and diagonals with only 1 cue on ER rotation to keep elbows at sides PROM to R shoulder in direction of flexion and abduction  12/14/22: pulleys x 2 min in direction of flexion and 2 min in direction of abduction, ball up wall x 10 reps in direction of flexion and 10 reps in direction of abduction (R) Instructed pt in supine scapular strengthening exercises and had pt return demonstrate 10 reps of each with yellow theraband as follows while therapist provided verbal and tactile cues to do correctly: narrow and wide overhead flexion, horizontal abduction, ER, and diagonals PROM to R shoulder in direction of flexion, abduction and ER while performing STM to R axilla  12/01/22: pulleys in direction of flexion and abduction x 2 min each, PROM to R shoulder in to flexion and abduction  PATIENT EDUCATION:  Education details: ABC class, importance of walking to decrease fatigue during chemp/radiation, post op exercises, cording, scar massage Person educated: Patient Education method: Explanation Education comprehension: verbalized understanding  HOME EXERCISE PROGRAM: Reviewed previously given  post op HEP. Walking program Supine scap exercises pulleys  ASSESSMENT:  CLINICAL IMPRESSION: Assessed pt's progress towards goals in therapy. She has now met all of her goals for therapy. She has returned to baseline shoulder ROM and is independent in a home exercise program. She will complete chemo and then undergo a mastectomy. Pt will be discharged from skilled PT services at this time.   Pt will benefit from skilled therapeutic intervention to improve on the following deficits: Decreased knowledge of precautions, impaired UE functional use, pain, decreased ROM, postural dysfunction.   PT treatment/interventions: ADL/Self care home management, Therapeutic exercises, Therapeutic activity, Patient/Family education, Self Care, Joint mobilization, Manual lymph drainage, Compression bandaging, scar mobilization, Taping, Vasopneumatic device, Manual therapy, and Re-evaluation   GOALS: Goals reviewed with patient? Yes  LONG TERM GOALS:  (STG=LTG)  GOALS Name Target Date  Goal status  1 Pt will demonstrate she has regained full shoulder ROM and function post operatively compared to baselines.  Baseline: 12/29/22 MET 12/28/22  2 Pt will be independent a home exercise program for long term strengthening and stretching. 12/29/22 MET 12/28/22  3 Pt will be able to verbalize lymphedema risk reduction practices  12/29/22 MET 12/28/22     PLAN:  PT FREQUENCY/DURATION: d/c this visit  PLAN FOR NEXT SESSION: d/c this session, continue with ldex screenings, pt plans to have mastectomy in Aug 2024   [facility]  [address]  [place] [address]  [phone]  After Breast Cancer Class It is recommended you attend the ABC class to be educated on lymphedema risk reduction. This class is free of charge and lasts for 1 hour. It is a 1-time class. You will need to download the Webex app either on your phone or computer. We will send you a link the night before or  the morning of the class. You should be able to click on that link to join the class. This is not a confidential class. You don't have to turn your camera on, but other participants may be able to see your email address.  Scar massage You can begin gentle  scar massage to you incision sites. Gently place one hand on the incision and move the skin (without sliding on the skin) in various directions. Do this for a few minutes and then you can gently massage either coconut oil or vitamin E cream into the scars.  Compression garment You should continue wearing your compression bra until you feel like you no longer have swelling.  Home exercise Program Continue doing the exercises you were given until you feel like you can do them without feeling any tightness at the end.   Walking Program Studies show that 30 minutes of walking per day (fast enough to elevate your heart rate) can significantly reduce the risk of a cancer recurrence. If you can't walk due to other medical reasons, we encourage you to find another activity you could do (like a stationary bike or water exercise).  Posture After breast cancer surgery, people frequently sit with rounded shoulders posture because it puts their incisions on slack and feels better. If you sit like this and scar tissue forms in that position, you can become very tight and have pain sitting or standing with good posture. Try to be aware of your posture and sit and stand up tall to heal properly.  Follow up PT: It is recommended you return every 3 months for the first 3 years following surgery to be assessed on the SOZO machine for an L-Dex score. This helps prevent clinically significant lymphedema in 95% of patients. These follow up screens are 10 minute appointments that you are not billed for.  [facility], PT 12/28/2022, 8:52 AM   PHYSICAL THERAPY DISCHARGE SUMMARY  Visits from Start of Care: 4  Current functional level related to goals /  functional outcomes: All goals met   Remaining deficits: None   Education / Equipment: HEP   Patient agrees to discharge. Patient goals were met. Patient is being discharged due to meeting the stated rehab goals.  Allyson Sabal [place], [place] 12/28/22 8:56 AM

## 2022-12-29 MED FILL — Dexamethasone Sodium Phosphate Inj 100 MG/10ML: INTRAMUSCULAR | Qty: 1 | Status: AC

## 2022-12-29 MED FILL — Fosaprepitant Dimeglumine For IV Infusion 150 MG (Base Eq): INTRAVENOUS | Qty: 5 | Status: AC

## 2022-12-30 ENCOUNTER — Encounter: Payer: Self-pay | Admitting: Emergency Medicine

## 2022-12-30 ENCOUNTER — Inpatient Hospital Stay (HOSPITAL_BASED_OUTPATIENT_CLINIC_OR_DEPARTMENT_OTHER): Payer: BC Managed Care – PPO | Admitting: Hematology and Oncology

## 2022-12-30 ENCOUNTER — Other Ambulatory Visit: Payer: BC Managed Care – PPO

## 2022-12-30 ENCOUNTER — Inpatient Hospital Stay: Payer: BC Managed Care – PPO

## 2022-12-30 ENCOUNTER — Ambulatory Visit: Payer: BC Managed Care – PPO

## 2022-12-30 DIAGNOSIS — Z95828 Presence of other vascular implants and grafts: Secondary | ICD-10-CM

## 2022-12-30 DIAGNOSIS — C50411 Malignant neoplasm of upper-outer quadrant of right female breast: Secondary | ICD-10-CM

## 2022-12-30 DIAGNOSIS — Z170 Estrogen receptor positive status [ER+]: Secondary | ICD-10-CM | POA: Diagnosis not present

## 2022-12-30 DIAGNOSIS — Z5111 Encounter for antineoplastic chemotherapy: Secondary | ICD-10-CM | POA: Diagnosis not present

## 2022-12-30 LAB — CMP (CANCER CENTER ONLY)
ALT: 14 U/L (ref 0–44)
AST: 12 U/L — ABNORMAL LOW (ref 15–41)
Albumin: 4 g/dL (ref 3.5–5.0)
Alkaline Phosphatase: 91 U/L (ref 38–126)
Anion gap: 8 (ref 5–15)
BUN: 16 mg/dL (ref 8–23)
CO2: 26 mmol/L (ref 22–32)
Calcium: 9 mg/dL (ref 8.9–10.3)
Chloride: 104 mmol/L (ref 98–111)
Creatinine: 0.69 mg/dL (ref 0.44–1.00)
GFR, Estimated: >60 mL/min (ref >60)
Glucose, Bld: 97 mg/dL (ref 70–99)
Potassium: 3.9 mmol/L (ref 3.5–5.1)
Sodium: 138 mmol/L (ref 135–145)
Total Bilirubin: 0.2 mg/dL — ABNORMAL LOW (ref 0.3–1.2)
Total Protein: 6.8 g/dL (ref 6.5–8.1)

## 2022-12-30 LAB — CBC WITH DIFFERENTIAL (CANCER CENTER ONLY)
Abs Immature Granulocytes: 1.27 10*3/uL — ABNORMAL HIGH (ref 0.00–0.07)
Basophils Absolute: 0.2 10*3/uL — ABNORMAL HIGH (ref 0.0–0.1)
Basophils Relative: 1 %
Eosinophils Absolute: 0 10*3/uL (ref 0.0–0.5)
Eosinophils Relative: 0 %
HCT: 32.4 % — ABNORMAL LOW (ref 36.0–46.0)
Hemoglobin: 10.7 g/dL — ABNORMAL LOW (ref 12.0–15.0)
Immature Granulocytes: 9 %
Lymphocytes Relative: 10 %
Lymphs Abs: 1.4 10*3/uL (ref 0.7–4.0)
MCH: 30.5 pg (ref 26.0–34.0)
MCHC: 33 g/dL (ref 30.0–36.0)
MCV: 92.3 fL (ref 80.0–100.0)
Monocytes Absolute: 1 10*3/uL (ref 0.1–1.0)
Monocytes Relative: 7 %
Neutro Abs: 10 10*3/uL — ABNORMAL HIGH (ref 1.7–7.7)
Neutrophils Relative %: 73 %
Platelet Count: 274 10*3/uL (ref 150–400)
RBC: 3.51 MIL/uL — ABNORMAL LOW (ref 3.87–5.11)
RDW: 14.3 % (ref 11.5–15.5)
WBC Count: 13.8 10*3/uL — ABNORMAL HIGH (ref 4.0–10.5)
nRBC: 0 % (ref 0.0–0.2)

## 2022-12-30 MED ORDER — SODIUM CHLORIDE 0.9 % IV SOLN
150.0000 mg | Freq: Once | INTRAVENOUS | Status: AC
  Administered 2022-12-30: 150 mg via INTRAVENOUS
  Filled 2022-12-30: qty 150

## 2022-12-30 MED ORDER — SODIUM CHLORIDE 0.9 % IV SOLN
10.0000 mg | Freq: Once | INTRAVENOUS | Status: AC
  Administered 2022-12-30: 10 mg via INTRAVENOUS
  Filled 2022-12-30: qty 10

## 2022-12-30 MED ORDER — SODIUM CHLORIDE 0.9% FLUSH
10.0000 mL | INTRAVENOUS | Status: DC | PRN
  Administered 2022-12-30: 10 mL

## 2022-12-30 MED ORDER — SODIUM CHLORIDE 0.9 % IV SOLN: Freq: Once | INTRAVENOUS | Status: AC

## 2022-12-30 MED ORDER — SODIUM CHLORIDE 0.9 % IV SOLN
600.0000 mg/m2 | Freq: Once | INTRAVENOUS | Status: AC
  Administered 2022-12-30: 1000 mg via INTRAVENOUS
  Filled 2022-12-30: qty 50

## 2022-12-30 MED ORDER — SODIUM CHLORIDE 0.9% FLUSH
10.0000 mL | Freq: Once | INTRAVENOUS | Status: AC
  Administered 2022-12-30: 10 mL

## 2022-12-30 MED ORDER — HEPARIN SOD (PORK) LOCK FLUSH 100 UNIT/ML IV SOLN
500.0000 [IU] | Freq: Once | INTRAVENOUS | Status: AC | PRN
  Administered 2022-12-30: 500 [IU]

## 2022-12-30 MED ORDER — DOXORUBICIN HCL CHEMO IV INJECTION 2 MG/ML
60.0000 mg/m2 | Freq: Once | INTRAVENOUS | Status: AC
  Administered 2022-12-30: 106 mg via INTRAVENOUS
  Filled 2022-12-30: qty 53

## 2022-12-30 MED ORDER — PALONOSETRON HCL INJECTION 0.25 MG/5ML
0.2500 mg | Freq: Once | INTRAVENOUS | Status: AC
  Administered 2022-12-30: 0.25 mg via INTRAVENOUS
  Filled 2022-12-30: qty 5

## 2022-12-30 NOTE — Progress Notes (Signed)
[facility] Follow up:    Caitlin Bis, MD [place] [address]   DIAGNOSIS:  Cancer Staging  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female, estrogen receptor positive (Caitlin [name]) Staging form: Breast, AJCC 8th Edition - Clinical: Stage IB (cT1c, cN0, cM0, G3, ER+, PR-, HER2-) - Signed by Nicholas Lose, MD on 09/25/2022 Histologic grading system: 3 grade system - Pathologic: Stage IIIC (pT1c, pN2a, cM0, G3, ER-, PR-, HER2-) - Signed by Nicholas Lose, MD on 10/21/2022 Stage prefix: Initial diagnosis Histologic grading system: 3 grade system   SUMMARY OF ONCOLOGIC HISTORY: Oncology History  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female, estrogen receptor positive (Caitlin [name])  09/15/2022 Initial Diagnosis   Screening mammogram detected right breast mass which measured 1.4 cm by ultrasound at 10 o'clock position, biopsy revealed grade 3 IDC with lymphovascular invasion ER 10%, PR 0%, Ki-67 30%, HER2 0 negative   09/25/2022 Cancer Staging   Staging form: Breast, AJCC 8th Edition - Clinical: Stage IB (cT1c, cN0, cM0, G3, ER+, PR-, HER2-) - Signed by Nicholas Lose, MD on 09/25/2022 Histologic grading system: 3 grade system    Genetic Testing   [facility] CancerNext-Expanded Panel+RNA was Negative. Report date is 10/06/2021.  The CancerNext-Expanded gene panel offered by [facility] and includes sequencing, rearrangement, and RNA analysis for the following 77 genes: AIP, ALK, APC, ATM, AXIN2, BAP1, BARD1, BLM, BMPR1A, BRCA1, BRCA2, BRIP1, CDC73, CDH1, CDK4, CDKN1B, CDKN2A, CHEK2, CTNNA1, DICER1, FANCC, FH, FLCN, GALNT12, KIF1B, LZTR1, MAX, MEN1, MET, MLH1, MSH2, MSH3, MSH6, MUTYH, NBN, NF1, NF2, NTHL1, PALB2, PHOX2B, PMS2, POT1, PRKAR1A, PTCH1, PTEN, RAD51C, RAD51D, RB1, RECQL, RET, SDHA, SDHAF2, SDHB, SDHC, SDHD, SMAD4, SMARCA4, SMARCB1, SMARCE1, STK11, SUFU, TMEM127, TP53, TSC1, TSC2, VHL and XRCC2 (sequencing and  deletion/duplication); EGFR, EGLN1, HOXB13, KIT, MITF, PDGFRA, POLD1, and POLE (sequencing only); EPCAM and GREM1 (deletion/duplication only).    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 with high-grade DCIS, inferior margin positive, angiolymphatic invasion present, 5/5 lymph nodes positive, additional medial margin: Poorly differentiated carcinoma with lymphoid stroma, additional inferior margin: Positive, ER 10% weak, PR 0%, HER2 0, Ki-67 30%   10/21/2022 Cancer Staging   Staging form: Breast, AJCC 8th Edition - Pathologic: Stage IIIC (pT1c, pN2a, cM0, G3, ER-, PR-, HER2-) - Signed by Nicholas Lose, MD on 10/21/2022 Stage prefix: Initial diagnosis Histologic grading system: 3 grade system   11/08/2022 Surgery   Margin reexcision: Inferior margin: Grade 3 IDC, new margin positive Medial margin: Grade 3 IDC new margin widely involved   11/18/2022 Surgery   Right inferior margin reexcision: Invasive poorly differentiated adenocarcinoma grade 3 present in new inferior margin.  Medial margin reexcision: Invasive poorly differentiated adenocarcinoma grade 3 present at new medial margin, posterior margin: Poorly differentiated adenocarcinoma grade 3, margins negative   12/02/2022 -  Chemotherapy   Patient is on Treatment Plan : BREAST ADJUVANT DOSE DENSE AC q14d / PACLitaxel q7d       CURRENT THERAPY: Adriamycin/Cytoxan  INTERVAL HISTORY: ALCIE [name] 67 y.o. female returns for follow-up and evaluation prior to receiving her next cycle of Adriamycin and Cytoxan.     Patient Active Problem List   Diagnosis Date Noted   Port-A-Cath in place 12/16/2022   Genetic testing 10/11/2022   Appendicitis with peritonitis 09/29/2022   Family history of prostate cancer 09/29/2022   Malignant neoplasm of upper-outer quadrant of right breast  in female, estrogen receptor positive (Caitlin [name]) 09/22/2022   Atrial  fibrillation with RVR (Caitlin [name]) 11/30/2010   Chronic anticoagulation 11/30/2010   Hypothyroidism 11/30/2010   PALPITATIONS 11/27/2010    is allergic to penicillins.  MEDICAL HISTORY: Past Medical History:  Diagnosis Date   Asthma    Dyspnea    History of kidney stones    Hypothyroidism    rt breast ca 08/2022   Right Breast   Scoliosis    Thyroid disease     SURGICAL HISTORY: Past Surgical History:  Procedure Laterality Date   APPENDECTOMY     AXILLARY SENTINEL NODE BIOPSY Right 10/14/2022   Procedure: RIGHT AXILLARY SENTINEL NODE BIOPSY;  Surgeon: Rolm Bookbinder, MD;  Location: [facility];  Service: General;  Laterality: Right;   BREAST BIOPSY Right 09/15/2022   [place] RT BREAST BX W LOC DEV 1ST LESION IMG BX [place] US GUIDE 09/15/2022 [facility]   BREAST BIOPSY  10/12/2022   [place] RT RADIOACTIVE SEED LOC 10/12/2022 [facility]AMMO[facility]   BREAST LUMPECTOMY WITH RADIOACTIVE SEED AND SENTINEL LYMPH NODE BIOPSY Right 10/14/2022   Procedure: RIGHT BREAST LUMPECTOMY WITH RADIOACTIVE SEED;  Surgeon: Rolm Bookbinder, MD;  Location: [facility] OR;  Service: General;  Laterality: Right;   COLONOSCOPY     LAPAROSCOPIC APPENDECTOMY     PORTACATH PLACEMENT Left 10/14/2022   Procedure: INSERTION PORT-A-CATH;  Surgeon: Rolm Bookbinder, MD;  Location: [facility];  Service: General;  Laterality: Left;   RE-EXCISION OF BREAST LUMPECTOMY Right 11/08/2022   Procedure: RE-EXCISION OF RIGHT BREAST LUMPECTOMY;  Surgeon: Rolm Bookbinder, MD;  Location: [facility];  Service: General;  Laterality: Right;   RE-EXCISION OF BREAST LUMPECTOMY Right 11/18/2022   Procedure: RE-EXCISION OF RIGHT BREAST LUMPECTOMY;  Surgeon: Rolm Bookbinder, MD;  Location: [facility];  Service: General;  Laterality: Right;   WISDOM TOOTH EXTRACTION      SOCIAL HISTORY: Social History   Socioeconomic History   Marital status: Married    Spouse name: Not on file   Number of children: Not on file   Years of education:  Not on file   Highest education level: Not on file  Occupational History   Not on file  Tobacco Use   Smoking status: Never   Smokeless tobacco: Not on file  Vaping Use   Vaping Use: Never used  Substance and Sexual Activity   Alcohol use: Not Currently   Drug use: Never   Sexual activity: Not Currently  Other Topics Concern   Not on file  Social History Narrative   Not on file   Social Determinants of Health   Financial Resource Strain: Not on file  Food Insecurity: Not on file  Transportation Needs: Not on file  Physical Activity: Not on file  Stress: Not on file  Social Connections: Not on file  Intimate Partner Violence: Not on file    FAMILY HISTORY: Family History  Problem Relation Age of Onset   Thyroid disease Mother    Prostate cancer Father 71       metastatic   Hypertension Father    Thyroid disease Sister        twin sister age 13   Asthma Sister    Prostate cancer Brother 27   Heart attack Maternal Grandmother        died age 29's   Heart attack Maternal Grandfather        died age 88's   Clotting disorder Paternal Grandmother        cerebral hemorrage  Review of Systems  Constitutional:  Positive for fatigue. Negative for appetite change, chills, fever and unexpected weight change.       Alopecia  HENT:   Negative for hearing loss, lump/mass and trouble swallowing.   Eyes:  Negative for eye problems and icterus.  Respiratory:  Negative for chest tightness, cough and shortness of breath.   Cardiovascular:  Negative for chest pain, leg swelling and palpitations.  Gastrointestinal:  Positive for constipation and nausea. Negative for abdominal distention, abdominal pain, diarrhea and vomiting.  Endocrine: Negative for hot flashes.  Genitourinary:  Negative for difficulty urinating.   Musculoskeletal:  Negative for arthralgias.  Skin:  Negative for itching and rash.  Neurological:  Negative for dizziness, extremity weakness, headaches and  numbness.  Hematological:  Negative for adenopathy. Does not bruise/bleed easily.  Psychiatric/Behavioral:  Negative for depression. The patient is not nervous/anxious.       PHYSICAL EXAMINATION  ECOG PERFORMANCE STATUS: 1 - Symptomatic but completely ambulatory  There were no vitals filed for this visit.   Physical Exam Constitutional:      General: She is not in acute distress.    Appearance: Normal appearance. She is not toxic-appearing.  HENT:     Head: Normocephalic and atraumatic.  Eyes:     General: No scleral icterus. Cardiovascular:     Rate and Rhythm: Normal rate and regular rhythm.     Pulses: Normal pulses.     Heart sounds: Normal heart sounds.  Pulmonary:     Effort: Pulmonary effort is normal.     Breath sounds: Normal breath sounds.  Abdominal:     General: Abdomen is flat. Bowel sounds are normal. There is no distension.     Palpations: Abdomen is soft.     Tenderness: There is no abdominal tenderness.  Musculoskeletal:        General: No swelling.     Cervical back: Neck supple.  Lymphadenopathy:     Cervical: No cervical adenopathy.  Skin:    General: Skin is warm and dry.     Findings: No rash.  Neurological:     General: No focal deficit present.     Mental Status: She is alert.  Psychiatric:        Mood and Affect: Mood normal.        Behavior: Behavior normal.     LABORATORY DATA:  CBC    Component Value Date/Time   WBC 15.4 (H) 12/16/2022 0753   WBC 11.1 (H) 10/13/2022 0930   RBC 3.92 12/16/2022 0753   HGB 11.9 (L) 12/16/2022 0753   HCT 36.0 12/16/2022 0753   PLT 238 12/16/2022 0753   MCV 91.8 12/16/2022 0753   MCH 30.4 12/16/2022 0753   MCHC 33.1 12/16/2022 0753   RDW 13.8 12/16/2022 0753   LYMPHSABS 2.0 12/16/2022 0753   MONOABS 1.2 (H) 12/16/2022 0753   EOSABS 0.1 12/16/2022 0753   BASOSABS 0.1 12/16/2022 0753    CMP     Component Value Date/Time   NA 139 12/16/2022 0753   K 4.2 12/16/2022 0753   CL 105 12/16/2022  0753   CO2 27 12/16/2022 0753   GLUCOSE 100 (H) 12/16/2022 0753   BUN 24 (H) 12/16/2022 0753   CREATININE 0.78 12/16/2022 0753   CALCIUM 9.3 12/16/2022 0753   PROT 6.8 12/16/2022 0753   ALBUMIN 4.1 12/16/2022 0753   AST 16 12/16/2022 0753   ALT 28 12/16/2022 0753   ALKPHOS 115 12/16/2022 0753   BILITOT 0.2 (L)  12/16/2022 0753   GFRNONAA >60 12/16/2022 0753     ASSESSMENT & PLAN:  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female, estrogen receptor positive ([place]) 10/14/2022:Right lumpectomy: 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  No distant metastatic disease. Bone scan 10/29/2022: No evidence of bone metastasis   11/08/2022 and 11/18/2022: Margin reexcision surgeries: Final margin focal positivity.  After much discussion the plan is to finish her systemic treatment and come back at a later point to do mastectomy.   Treatment plan: Adjuvant chemotherapy with dose dense Adriamycin and Cytoxan followed by Taxol  Adjuvant radiation ------------------------------------------------------------------------------------------------------------------------------------------ Current treatment: Cycle 3 day 1 dose dense Adriamycin and Cytoxan  She is doing remarkably well, toxicities so far include grade 1 nausea, grade 1 constipation and alopecia.  No concern for infection. CBC reviewed and satisfactory, CMP pending at the time of visit Physical exam  unremarkable, no concerns. Ok to proceed with chemotherapy if labs are within parameters.  RTC with Dr Lindi Adie before planned C4.  All questions were answered. The patient knows to call the clinic with any problems, questions or concerns. We can certainly see the patient much sooner if necessary.  Total encounter time:30 minutes*in face-to-face visit time, chart review, lab review, care coordination, order entry, and documentation of the encounter time.   *Total Encounter Time as defined by the Centers for Medicare and Medicaid Services includes, in addition to the face-to-face time of a patient visit (documented in the note above) non-face-to-face time: obtaining and reviewing outside history, ordering and reviewing medications, tests or procedures, care coordination (communications with other health care professionals or caregivers) and documentation in the medical record.

## 2022-12-30 NOTE — Research (Signed)
S2205, ICE COMPRESS: RANDOMIZED TRIAL OF LIMB CRYOCOMPRESSION VERSUS CONTINUOUS COMPRESSION VERSUS LOW CYCLIC COMPRESSION FOR THE PREVENTION  OF TAXANE-INDUCED PERIPHERAL NEUROPATHY  INTRO TO STUDY/CONSENT FORMS  Patient Caitlin [name] was identified by Clinical Research as a potential candidate for the above listed study.  This Clinical Research Nurse met with [name], [id number], on 12/30/22 in a manner and location that ensures patient privacy to discuss participation in the above listed research study.  Patient is Unaccompanied.  A copy of the informed consent document and separate HIPAA Authorization was provided to the patient.  Patient reads, speaks, and understands [place].   Patient was provided with the business card of this Nurse and encouraged to contact the research team with any questions.  Approximately 20 minutes were spent with the patient reviewing the informed consent documents.  Patient was provided the option of taking informed consent documents home to review and was encouraged to review at their convenience with their support network, including other care providers. Patient took the consent documents home to review.  Will f/u with patient in the next few days to determine interest.  [facility] Guiles 'Learta CoddingNeysa Bonito, RN, BSN, [facility] Nurse I 12/30/22 9:59 AM

## 2022-12-30 NOTE — Patient Instructions (Signed)
[facility]  Discharge Instructions: Thank you for choosing [facility] to provide your oncology and hematology care.   If you have a lab appointment with the [facility], please go directly to the [facility] and check in at the registration area.   Wear comfortable clothing and clothing appropriate for easy access to any Portacath or PICC line.   We strive to give you quality time with your provider. You may need to reschedule your appointment if you arrive late (15 or more minutes).  Arriving late affects you and other patients whose appointments are after yours.  Also, if you miss three or more appointments without notifying the office, you may be dismissed from the clinic at the provider's discretion.      For prescription refill requests, have your pharmacy contact our office and allow 72 hours for refills to be completed.    Today you received the following chemotherapy and/or immunotherapy agents: Adriamycin/Cytoxan      To help prevent nausea and vomiting after your treatment, we encourage you to take your nausea medication as directed.  BELOW ARE SYMPTOMS THAT SHOULD BE REPORTED IMMEDIATELY: *FEVER GREATER THAN 100.4 F (38 C) OR HIGHER *CHILLS OR SWEATING *NAUSEA AND VOMITING THAT IS NOT CONTROLLED WITH YOUR NAUSEA MEDICATION *UNUSUAL SHORTNESS OF BREATH *UNUSUAL BRUISING OR BLEEDING *URINARY PROBLEMS (pain or burning when urinating, or frequent urination) *BOWEL PROBLEMS (unusual diarrhea, constipation, pain near the anus) TENDERNESS IN MOUTH AND THROAT WITH OR WITHOUT PRESENCE OF ULCERS (sore throat, sores in mouth, or a toothache) UNUSUAL RASH, SWELLING OR PAIN  UNUSUAL VAGINAL DISCHARGE OR ITCHING   Items with * indicate a potential emergency and should be followed up as soon as possible or go to the Emergency Department if any problems should occur.  Please show the CHEMOTHERAPY ALERT CARD or IMMUNOTHERAPY ALERT CARD  at check-in to the Emergency Department and triage nurse.  Should you have questions after your visit or need to cancel or reschedule your appointment, please contact [facility]  Dept: [phone]  and follow the prompts.  Office hours are 8:00 a.m. to 4:30 p.m. Monday - Friday. Please note that voicemails left after 4:00 p.m. may not be returned until the following business day.  We are closed weekends and major holidays. You have access to a nurse at all times for urgent questions. Please call the main number to the clinic Dept: [phone] and follow the prompts.   For any non-urgent questions, you may also contact your provider using MyChart. We now offer e-Visits for anyone 70 and older to request care online for non-urgent symptoms. For details visit [email or web address].   Also download the MyChart app! Go to the app store, search "MyChart", open the app, select [facility], and log in with your MyChart username and password.

## 2023-01-01 ENCOUNTER — Inpatient Hospital Stay: Payer: BC Managed Care – PPO

## 2023-01-01 VITALS — BP 126/78 | HR 77 | Temp 97.9°F | Resp 18

## 2023-01-01 DIAGNOSIS — Z5111 Encounter for antineoplastic chemotherapy: Secondary | ICD-10-CM | POA: Diagnosis not present

## 2023-01-01 DIAGNOSIS — Z170 Estrogen receptor positive status [ER+]: Secondary | ICD-10-CM

## 2023-01-01 MED ORDER — PEGFILGRASTIM-CBQV 6 MG/0.6ML ~~LOC~~ SOSY
6.0000 mg | PREFILLED_SYRINGE | Freq: Once | SUBCUTANEOUS | Status: AC
  Administered 2023-01-01: 6 mg via SUBCUTANEOUS

## 2023-01-03 ENCOUNTER — Telehealth: Payer: Self-pay

## 2023-01-03 ENCOUNTER — Telehealth: Payer: Self-pay | Admitting: Emergency Medicine

## 2023-01-03 NOTE — Progress Notes (Signed)
Received call from [facility] requesting order for compression garment. Attempted to call to clarify specific order needed, office closed. Order for compression bra per PT note faxed to [phone] with receipt confirmation.

## 2023-01-03 NOTE — Telephone Encounter (Signed)
Returned call and left a message asking for a call to the office to clarify what type of Rx is needed. Left office phone #.

## 2023-01-03 NOTE — Telephone Encounter (Signed)
S2205, ICE COMPRESS: RANDOMIZED TRIAL OF LIMB CRYOCOMPRESSION VERSUS CONTINUOUS COMPRESSION VERSUS LOW CYCLIC COMPRESSION FOR THE PREVENTION  OF TAXANE-INDUCED PERIPHERAL NEUROPATHY  Called to f/u on interest in study.  No answer.  ROI on file.  Left VM to call back when able to discuss study interest.  Will f/u next week.  [facility] Guiles 'Learta Codding' Neysa Bonito, RN, BSN, [facility] Clinical Research Nurse I 01/03/23 4:06 PM

## 2023-01-10 ENCOUNTER — Ambulatory Visit: Payer: BC Managed Care – PPO

## 2023-01-12 ENCOUNTER — Telehealth: Payer: Self-pay | Admitting: Emergency Medicine

## 2023-01-12 NOTE — Telephone Encounter (Signed)
N4627, ICE COMPRESS: RANDOMIZED TRIAL OF LIMB CRYOCOMPRESSION VERSUS CONTINUOUS COMPRESSION VERSUS LOW CYCLIC COMPRESSION FOR THE PREVENTION  OF TAXANE-INDUCED PERIPHERAL NEUROPATHY  Patient called to decline study, reason unspecified.  Left return VM offering [phone] if patient is interested with call back number.  [name] '[name]' [name], RN, BSN, [facility] Clinical Research Nurse I 01/12/23 4:31 PM

## 2023-01-14 MED FILL — Dexamethasone Sodium Phosphate Inj 100 MG/10ML: INTRAMUSCULAR | Qty: 1 | Status: AC

## 2023-01-14 MED FILL — Fosaprepitant Dimeglumine For IV Infusion 150 MG (Base Eq): INTRAVENOUS | Qty: 5 | Status: AC

## 2023-01-14 NOTE — Progress Notes (Signed)
Patient Care Team: [name], MD as PCP - General (Unknown Physician Specialty) Pershing Proud, RN as Oncology Nurse Navigator Donnelly Angelica, RN as Oncology Nurse Navigator [name], MD as Consulting Physician (Hematology and Oncology)  DIAGNOSIS: No diagnosis found.  SUMMARY OF ONCOLOGIC HISTORY: Oncology History  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female, estrogen receptor positive  09/15/2022 Initial Diagnosis   Screening mammogram detected right breast mass which measured 1.4 cm by ultrasound at 10 o'clock position, biopsy revealed grade 3 IDC with lymphovascular invasion ER 10%, PR 0%, Ki-67 30%, HER2 0 negative   09/25/2022 Cancer Staging   Staging form: Breast, AJCC 8th Edition - Clinical: Stage IB (cT1c, cN0, cM0, G3, ER+, PR-, HER2-) - Signed by [name], MD on 09/25/2022 Histologic grading system: 3 grade system    Genetic Testing   [facility] CancerNext-Expanded Panel+RNA was Negative. Report date is 10/06/2021.  The CancerNext-Expanded gene panel offered by [facility] and includes sequencing, rearrangement, and RNA analysis for the following 77 genes: AIP, ALK, APC, ATM, AXIN2, BAP1, BARD1, BLM, BMPR1A, BRCA1, BRCA2, BRIP1, CDC73, CDH1, CDK4, CDKN1B, CDKN2A, CHEK2, CTNNA1, DICER1, FANCC, FH, FLCN, GALNT12, KIF1B, LZTR1, MAX, MEN1, MET, MLH1, MSH2, MSH3, MSH6, MUTYH, NBN, NF1, NF2, NTHL1, PALB2, PHOX2B, PMS2, POT1, PRKAR1A, PTCH1, PTEN, RAD51C, RAD51D, RB1, RECQL, RET, SDHA, SDHAF2, SDHB, SDHC, SDHD, SMAD4, SMARCA4, SMARCB1, SMARCE1, STK11, SUFU, TMEM127, TP53, TSC1, TSC2, VHL and XRCC2 (sequencing and deletion/duplication); EGFR, EGLN1, HOXB13, KIT, MITF, PDGFRA, POLD1, and POLE (sequencing only); EPCAM and GREM1 (deletion/duplication only).    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 with high-grade DCIS, inferior margin positive,  angiolymphatic invasion present, 5/5 lymph nodes positive, additional medial margin: Poorly differentiated carcinoma with lymphoid stroma, additional inferior margin: Positive, ER 10% weak, PR 0%, HER2 0, Ki-67 30%   10/21/2022 Cancer Staging   Staging form: Breast, AJCC 8th Edition - Pathologic: Stage IIIC (pT1c, pN2a, cM0, G3, ER-, PR-, HER2-) - Signed by [name], MD on 10/21/2022 Stage prefix: Initial diagnosis Histologic grading system: 3 grade system   11/08/2022 Surgery   Margin reexcision: Inferior margin: Grade 3 IDC, new margin positive Medial margin: Grade 3 IDC new margin widely involved   11/18/2022 Surgery   Right inferior margin reexcision: Invasive poorly differentiated adenocarcinoma grade 3 present in new inferior margin.  Medial margin reexcision: Invasive poorly differentiated adenocarcinoma grade 3 present at new medial margin, posterior margin: Poorly differentiated adenocarcinoma grade 3, margins negative   12/02/2022 -  Chemotherapy   Patient is on Treatment Plan : BREAST ADJUVANT DOSE DENSE AC q14d / PACLitaxel q7d       CHIEF COMPLIANT: Toxicity check  INTERVAL HISTORY: Caitlin [name] is a 67 y.o. female is here because of recent diagnosis of right breast cancer. She presents to the clinic for a follow-up.    ALLERGIES:  is allergic to penicillins.  MEDICATIONS:  Current Outpatient Medications  Medication Sig Dispense Refill   acetaminophen (TYLENOL) 500 MG tablet Take 500 mg by mouth every 6 (six) hours as needed for moderate pain.     albuterol (VENTOLIN HFA) 108 (90 Base) MCG/ACT inhaler Inhale 2 puffs into the lungs every 4 (four) hours as needed for shortness of breath (Asthma).     aspirin 81 MG tablet Take 81 mg by mouth daily.       benzonatate (TESSALON) 200 MG capsule Take 200 mg by mouth daily  as needed for cough.     dexamethasone (DECADRON) 4 MG tablet Take 2 tablets (8 mg total) by mouth daily for 3 days. Start the day after  doxorubicin/cyclophosphamide chemotherapy. Take with food. (Patient not taking: Reported on 12/02/2022) 30 tablet 1   diltiazem (CARDIZEM CD) 120 MG 24 hr capsule Take 120 mg by mouth daily.     levothyroxine (SYNTHROID) 75 MCG tablet Take 75 mcg by mouth daily before breakfast.     lidocaine-prilocaine (EMLA) cream Apply to affected area once 30 g 3   Multiple Vitamins-Minerals (MULTIVITAMIN WITH MINERALS) tablet Take 1 tablet by mouth daily.     ondansetron (ZOFRAN) 8 MG tablet Take 1 tab (8 mg) by mouth every 8 hrs as needed for nausea/vomiting. Start third day after doxorubicin/cyclophosphamide chemotherapy. (Patient not taking: Reported on 12/02/2022) 30 tablet 1   OVER THE COUNTER MEDICATION Take 1 capsule by mouth 2 (two) times daily. copaiba softgels     prochlorperazine (COMPAZINE) 10 MG tablet TAKE 1 TABLET BY MOUTH EVERY 6 HOURS AS NEEDED FOR NAUSEA / FOR VOMITING 30 tablet 1   PULMICORT FLEXHALER 180 MCG/ACT inhaler Inhale 1 puff into the lungs daily.     traMADol (ULTRAM) 50 MG tablet Take 1 tablet (50 mg total) by mouth every 6 (six) hours as needed. 10 tablet 0   triamcinolone cream (KENALOG) 0.1 % Apply 1 Application topically daily as needed (Dry skin).     No current facility-administered medications for this visit.    PHYSICAL EXAMINATION: ECOG PERFORMANCE STATUS: {CHL ONC ECOG [phone]}  There were no vitals filed for this visit. There were no vitals filed for this visit.  BREAST:*** No palpable masses or nodules in either right or left breasts. No palpable axillary supraclavicular or infraclavicular adenopathy no breast tenderness or nipple discharge. (exam performed in the presence of a chaperone)  LABORATORY DATA:  I have reviewed the data as listed    Latest Ref Rng & Units 12/30/2022    8:48 AM 12/16/2022    7:53 AM 12/09/2022    9:54 AM  CMP  Glucose 70 - 99 mg/dL 97  818  91   BUN 8 - 23 mg/dL 16  24  25    Creatinine 0.44 - 1.00 mg/dL 2.99  3.71  6.96    Sodium 135 - 145 mmol/L 138  139  136   Potassium 3.5 - 5.1 mmol/L 3.9  4.2  4.3   Chloride 98 - 111 mmol/L 104  105  103   CO2 22 - 32 mmol/L 26  27  27    Calcium 8.9 - 10.3 mg/dL 9.0  9.3  9.0   Total Protein 6.5 - 8.1 g/dL 6.8  6.8  6.5   Total Bilirubin 0.3 - 1.2 mg/dL 0.2  0.2  0.3   Alkaline Phos 38 - 126 U/L 91  115  88   AST 15 - 41 U/L 12  16  11    ALT 0 - 44 U/L 14  28  18      Lab Results  Component Value Date   WBC 13.8 (H) 12/30/2022   HGB 10.7 (L) 12/30/2022   HCT 32.4 (L) 12/30/2022   MCV 92.3 12/30/2022   PLT 274 12/30/2022   NEUTROABS 10.0 (H) 12/30/2022    ASSESSMENT & PLAN:  No problem-specific Assessment & Plan notes found for this encounter.    No orders of the defined types were placed in this encounter.  The patient has a good understanding of the  overall plan. she agrees with it. she will call with any problems that may develop before the next visit here. Total time spent: 30 mins including face to face time and time spent for planning, charting and co-ordination of care   Sherlyn Lick, CMA 01/14/23    I Janan Ridge am acting as a Neurosurgeon for [employer]  ***

## 2023-01-17 ENCOUNTER — Encounter: Payer: Self-pay | Admitting: Hematology and Oncology

## 2023-01-17 ENCOUNTER — Inpatient Hospital Stay (HOSPITAL_BASED_OUTPATIENT_CLINIC_OR_DEPARTMENT_OTHER): Payer: BC Managed Care – PPO | Admitting: Hematology and Oncology

## 2023-01-17 ENCOUNTER — Encounter: Payer: Self-pay | Admitting: *Deleted

## 2023-01-17 ENCOUNTER — Inpatient Hospital Stay: Payer: BC Managed Care – PPO

## 2023-01-17 ENCOUNTER — Inpatient Hospital Stay: Payer: BC Managed Care – PPO | Attending: Hematology and Oncology

## 2023-01-17 VITALS — BP 123/74 | HR 76 | Resp 17

## 2023-01-17 VITALS — BP 130/73 | HR 83 | Temp 97.8°F | Resp 18 | Ht 63.0 in | Wt 156.1 lb

## 2023-01-17 DIAGNOSIS — Z170 Estrogen receptor positive status [ER+]: Secondary | ICD-10-CM | POA: Insufficient documentation

## 2023-01-17 DIAGNOSIS — C50411 Malignant neoplasm of upper-outer quadrant of right female breast: Secondary | ICD-10-CM

## 2023-01-17 DIAGNOSIS — Z5111 Encounter for antineoplastic chemotherapy: Secondary | ICD-10-CM | POA: Diagnosis present

## 2023-01-17 DIAGNOSIS — Z5189 Encounter for other specified aftercare: Secondary | ICD-10-CM | POA: Diagnosis not present

## 2023-01-17 DIAGNOSIS — Z79899 Other long term (current) drug therapy: Secondary | ICD-10-CM | POA: Diagnosis not present

## 2023-01-17 DIAGNOSIS — Z95828 Presence of other vascular implants and grafts: Secondary | ICD-10-CM

## 2023-01-17 LAB — CBC WITH DIFFERENTIAL (CANCER CENTER ONLY)
Abs Immature Granulocytes: 0.13 10*3/uL — ABNORMAL HIGH (ref 0.00–0.07)
Basophils Absolute: 0.1 10*3/uL (ref 0.0–0.1)
Basophils Relative: 2 %
Eosinophils Absolute: 0.2 10*3/uL (ref 0.0–0.5)
Eosinophils Relative: 4 %
HCT: 33.6 % — ABNORMAL LOW (ref 36.0–46.0)
Hemoglobin: 11.1 g/dL — ABNORMAL LOW (ref 12.0–15.0)
Immature Granulocytes: 2 %
Lymphocytes Relative: 16 %
Lymphs Abs: 0.9 10*3/uL (ref 0.7–4.0)
MCH: 30.7 pg (ref 26.0–34.0)
MCHC: 33 g/dL (ref 30.0–36.0)
MCV: 93.1 fL (ref 80.0–100.0)
Monocytes Absolute: 1 10*3/uL (ref 0.1–1.0)
Monocytes Relative: 17 %
Neutro Abs: 3.4 10*3/uL (ref 1.7–7.7)
Neutrophils Relative %: 59 %
Platelet Count: 325 10*3/uL (ref 150–400)
RBC: 3.61 MIL/uL — ABNORMAL LOW (ref 3.87–5.11)
RDW: 15.8 % — ABNORMAL HIGH (ref 11.5–15.5)
WBC Count: 5.7 10*3/uL (ref 4.0–10.5)
nRBC: 0 % (ref 0.0–0.2)

## 2023-01-17 LAB — CMP (CANCER CENTER ONLY)
ALT: 12 U/L (ref 0–44)
AST: 15 U/L (ref 15–41)
Albumin: 4 g/dL (ref 3.5–5.0)
Alkaline Phosphatase: 80 U/L (ref 38–126)
Anion gap: 6 (ref 5–15)
BUN: 20 mg/dL (ref 8–23)
CO2: 26 mmol/L (ref 22–32)
Calcium: 9.4 mg/dL (ref 8.9–10.3)
Chloride: 107 mmol/L (ref 98–111)
Creatinine: 0.7 mg/dL (ref 0.44–1.00)
GFR, Estimated: >60 mL/min (ref >60)
Glucose, Bld: 88 mg/dL (ref 70–99)
Potassium: 4 mmol/L (ref 3.5–5.1)
Sodium: 139 mmol/L (ref 135–145)
Total Bilirubin: 0.3 mg/dL (ref 0.3–1.2)
Total Protein: 6.5 g/dL (ref 6.5–8.1)

## 2023-01-17 MED ORDER — HEPARIN SOD (PORK) LOCK FLUSH 100 UNIT/ML IV SOLN
500.0000 [IU] | Freq: Once | INTRAVENOUS | Status: AC | PRN
  Administered 2023-01-17: 500 [IU]

## 2023-01-17 MED ORDER — DOXORUBICIN HCL CHEMO IV INJECTION 2 MG/ML
60.0000 mg/m2 | Freq: Once | INTRAVENOUS | Status: AC
  Administered 2023-01-17: 106 mg via INTRAVENOUS
  Filled 2023-01-17: qty 53

## 2023-01-17 MED ORDER — SODIUM CHLORIDE 0.9% FLUSH
10.0000 mL | Freq: Once | INTRAVENOUS | Status: AC
  Administered 2023-01-17: 10 mL

## 2023-01-17 MED ORDER — SODIUM CHLORIDE 0.9 % IV SOLN
600.0000 mg/m2 | Freq: Once | INTRAVENOUS | Status: AC
  Administered 2023-01-17: 1000 mg via INTRAVENOUS
  Filled 2023-01-17: qty 50

## 2023-01-17 MED ORDER — SODIUM CHLORIDE 0.9 % IV SOLN
10.0000 mg | Freq: Once | INTRAVENOUS | Status: AC
  Administered 2023-01-17: 10 mg via INTRAVENOUS
  Filled 2023-01-17: qty 10

## 2023-01-17 MED ORDER — SODIUM CHLORIDE 0.9 % IV SOLN
150.0000 mg | Freq: Once | INTRAVENOUS | Status: AC
  Administered 2023-01-17: 150 mg via INTRAVENOUS
  Filled 2023-01-17: qty 150

## 2023-01-17 MED ORDER — SODIUM CHLORIDE 0.9 % IV SOLN: Freq: Once | INTRAVENOUS | Status: AC

## 2023-01-17 MED ORDER — SODIUM CHLORIDE 0.9% FLUSH
10.0000 mL | INTRAVENOUS | Status: DC | PRN
  Administered 2023-01-17: 10 mL

## 2023-01-17 MED ORDER — PALONOSETRON HCL INJECTION 0.25 MG/5ML
0.2500 mg | Freq: Once | INTRAVENOUS | Status: AC
  Administered 2023-01-17: 0.25 mg via INTRAVENOUS
  Filled 2023-01-17: qty 5

## 2023-01-17 NOTE — Patient Instructions (Signed)
[facility]  Discharge Instructions: Thank you for choosing [facility] to provide your oncology and hematology care.   If you have a lab appointment with the Cancer Center, please go directly to the Cancer Center and check in at the registration area.   Wear comfortable clothing and clothing appropriate for easy access to any Portacath or PICC line.   We strive to give you quality time with your provider. You may need to reschedule your appointment if you arrive late (15 or more minutes).  Arriving late affects you and other patients whose appointments are after yours.  Also, if you miss three or more appointments without notifying the office, you may be dismissed from the clinic at the provider's discretion.      For prescription refill requests, have your pharmacy contact our office and allow 72 hours for refills to be completed.    Today you received the following chemotherapy and/or immunotherapy agents: Doxorubicin and Cytoxan      To help prevent nausea and vomiting after your treatment, we encourage you to take your nausea medication as directed.  BELOW ARE SYMPTOMS THAT SHOULD BE REPORTED IMMEDIATELY: *FEVER GREATER THAN 100.4 F (38 C) OR HIGHER *CHILLS OR SWEATING *NAUSEA AND VOMITING THAT IS NOT CONTROLLED WITH YOUR NAUSEA MEDICATION *UNUSUAL SHORTNESS OF BREATH *UNUSUAL BRUISING OR BLEEDING *URINARY PROBLEMS (pain or burning when urinating, or frequent urination) *BOWEL PROBLEMS (unusual diarrhea, constipation, pain near the anus) TENDERNESS IN MOUTH AND THROAT WITH OR WITHOUT PRESENCE OF ULCERS (sore throat, sores in mouth, or a toothache) UNUSUAL RASH, SWELLING OR PAIN  UNUSUAL VAGINAL DISCHARGE OR ITCHING   Items with * indicate a potential emergency and should be followed up as soon as possible or go to the Emergency Department if any problems should occur.  Please show the CHEMOTHERAPY ALERT CARD or IMMUNOTHERAPY ALERT  CARD at check-in to the Emergency Department and triage nurse.  Should you have questions after your visit or need to cancel or reschedule your appointment, please contact [facility]  Dept: [phone]  and follow the prompts.  Office hours are 8:00 a.m. to 4:30 p.m. Monday - Friday. Please note that voicemails left after 4:00 p.m. may not be returned until the following business day.  We are closed weekends and major holidays. You have access to a nurse at all times for urgent questions. Please call the main number to the clinic Dept: [phone] and follow the prompts.   For any non-urgent questions, you may also contact your provider using MyChart. We now offer e-Visits for anyone 18 and older to request care online for non-urgent symptoms. For details visit [email or web address].   Also download the MyChart app! Go to the app store, search "MyChart", open the app, select , and log in with your MyChart username and password.   

## 2023-01-17 NOTE — Assessment & Plan Note (Signed)
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  No distant metastatic disease. Bone scan 10/29/2022: No evidence of bone metastasis   11/08/2022 and 11/18/2022: Margin reexcision surgeries: Final margin focal positivity.  After much discussion the plan is to finish her systemic treatment and come back at a later point to do mastectomy.   Treatment plan: Adjuvant chemotherapy with dose dense Adriamycin and Cytoxan followed by Taxol  Adjuvant radiation ------------------------------------------------------------------------------------------------------------------------------------------ Current treatment: Cycle 4 day 1 dose dense Adriamycin and Cytoxan Chemo toxicities: Nausea: Grade 1 Constipation Alopecia  Trend closely for toxicities Return to clinic in 2 weeks for cycle 1 of Taxol

## 2023-01-19 ENCOUNTER — Inpatient Hospital Stay: Payer: BC Managed Care – PPO

## 2023-01-19 VITALS — BP 127/75 | HR 77 | Temp 99.0°F | Resp 16

## 2023-01-19 DIAGNOSIS — Z5111 Encounter for antineoplastic chemotherapy: Secondary | ICD-10-CM | POA: Diagnosis not present

## 2023-01-19 DIAGNOSIS — C50411 Malignant neoplasm of upper-outer quadrant of right female breast: Secondary | ICD-10-CM

## 2023-01-19 MED ORDER — PEGFILGRASTIM-CBQV 6 MG/0.6ML ~~LOC~~ SOSY
6.0000 mg | PREFILLED_SYRINGE | Freq: Once | SUBCUTANEOUS | Status: AC
  Administered 2023-01-19: 6 mg via SUBCUTANEOUS
  Filled 2023-01-19: qty 0.6

## 2023-01-25 ENCOUNTER — Encounter: Payer: Self-pay | Admitting: Hematology and Oncology

## 2023-01-26 DIAGNOSIS — J189 Pneumonia, unspecified organism: Secondary | ICD-10-CM

## 2023-01-26 HISTORY — DX: Pneumonia, unspecified organism: J18.9

## 2023-01-28 ENCOUNTER — Encounter: Payer: Self-pay | Admitting: Hematology and Oncology

## 2023-01-28 MED FILL — Dexamethasone Sodium Phosphate Inj 100 MG/10ML: INTRAMUSCULAR | Qty: 1 | Status: AC

## 2023-01-31 ENCOUNTER — Inpatient Hospital Stay: Payer: BC Managed Care – PPO

## 2023-01-31 ENCOUNTER — Inpatient Hospital Stay (HOSPITAL_BASED_OUTPATIENT_CLINIC_OR_DEPARTMENT_OTHER): Payer: BC Managed Care – PPO | Admitting: Adult Health

## 2023-01-31 ENCOUNTER — Telehealth: Payer: Self-pay | Admitting: Emergency Medicine

## 2023-01-31 ENCOUNTER — Encounter: Payer: Self-pay | Admitting: Hematology and Oncology

## 2023-01-31 ENCOUNTER — Encounter: Payer: Self-pay | Admitting: Adult Health

## 2023-01-31 VITALS — BP 130/70 | HR 93 | Temp 98.2°F | Resp 16 | Ht 63.0 in | Wt 150.2 lb

## 2023-01-31 DIAGNOSIS — C50411 Malignant neoplasm of upper-outer quadrant of right female breast: Secondary | ICD-10-CM | POA: Diagnosis not present

## 2023-01-31 DIAGNOSIS — Z170 Estrogen receptor positive status [ER+]: Secondary | ICD-10-CM

## 2023-01-31 DIAGNOSIS — Z5111 Encounter for antineoplastic chemotherapy: Secondary | ICD-10-CM | POA: Diagnosis not present

## 2023-01-31 DIAGNOSIS — Z95828 Presence of other vascular implants and grafts: Secondary | ICD-10-CM

## 2023-01-31 LAB — CBC WITH DIFFERENTIAL (CANCER CENTER ONLY)
Abs Immature Granulocytes: 0.12 10*3/uL — ABNORMAL HIGH (ref 0.00–0.07)
Basophils Absolute: 0.1 10*3/uL (ref 0.0–0.1)
Basophils Relative: 1 %
Eosinophils Absolute: 0 10*3/uL (ref 0.0–0.5)
Eosinophils Relative: 0 %
HCT: 27 % — ABNORMAL LOW (ref 36.0–46.0)
Hemoglobin: 9 g/dL — ABNORMAL LOW (ref 12.0–15.0)
Immature Granulocytes: 1 %
Lymphocytes Relative: 8 %
Lymphs Abs: 0.7 10*3/uL (ref 0.7–4.0)
MCH: 30.7 pg (ref 26.0–34.0)
MCHC: 33.3 g/dL (ref 30.0–36.0)
MCV: 92.2 fL (ref 80.0–100.0)
Monocytes Absolute: 0.8 10*3/uL (ref 0.1–1.0)
Monocytes Relative: 9 %
Neutro Abs: 6.8 10*3/uL (ref 1.7–7.7)
Neutrophils Relative %: 81 %
Platelet Count: 555 10*3/uL — ABNORMAL HIGH (ref 150–400)
RBC: 2.93 MIL/uL — ABNORMAL LOW (ref 3.87–5.11)
RDW: 15.6 % — ABNORMAL HIGH (ref 11.5–15.5)
WBC Count: 8.5 10*3/uL (ref 4.0–10.5)
nRBC: 0 % (ref 0.0–0.2)

## 2023-01-31 LAB — CMP (CANCER CENTER ONLY)
ALT: 24 U/L (ref 0–44)
AST: 20 U/L (ref 15–41)
Albumin: 3.5 g/dL (ref 3.5–5.0)
Alkaline Phosphatase: 76 U/L (ref 38–126)
Anion gap: 8 (ref 5–15)
BUN: 11 mg/dL (ref 8–23)
CO2: 27 mmol/L (ref 22–32)
Calcium: 9.4 mg/dL (ref 8.9–10.3)
Chloride: 105 mmol/L (ref 98–111)
Creatinine: 0.62 mg/dL (ref 0.44–1.00)
GFR, Estimated: >60 mL/min (ref >60)
Glucose, Bld: 100 mg/dL — ABNORMAL HIGH (ref 70–99)
Potassium: 3.8 mmol/L (ref 3.5–5.1)
Sodium: 140 mmol/L (ref 135–145)
Total Bilirubin: 0.2 mg/dL — ABNORMAL LOW (ref 0.3–1.2)
Total Protein: 6.5 g/dL (ref 6.5–8.1)

## 2023-01-31 MED ORDER — SODIUM CHLORIDE 0.9% FLUSH
10.0000 mL | Freq: Once | INTRAVENOUS | Status: AC
  Administered 2023-01-31: 10 mL

## 2023-01-31 NOTE — Telephone Encounter (Signed)
Error

## 2023-01-31 NOTE — Progress Notes (Signed)
[facility] Cancer Follow up:    Caitlin Chimera, MD [address] [place] [address]   DIAGNOSIS:  Cancer Staging  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female, estrogen receptor positive (HCC) Staging form: Breast, AJCC 8th Edition - Clinical: Stage IB (cT1c, cN0, cM0, G3, ER+, PR-, HER2-) - Signed by [name], MD on 09/25/2022 Histologic grading system: 3 grade system - Pathologic: Stage IIIC (pT1c, pN2a, cM0, G3, ER-, PR-, HER2-) - Signed by [name], MD on 10/21/2022 Stage prefix: Initial diagnosis Histologic grading system: 3 grade system   SUMMARY OF ONCOLOGIC HISTORY: Oncology History  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female, estrogen receptor positive (HCC)  09/15/2022 Initial Diagnosis   Screening mammogram detected right breast mass which measured 1.4 cm by ultrasound at 10 o'clock position, biopsy revealed grade 3 IDC with lymphovascular invasion ER 10%, PR 0%, Ki-67 30%, HER2 0 negative   09/25/2022 Cancer Staging   Staging form: Breast, AJCC 8th Edition - Clinical: Stage IB (cT1c, cN0, cM0, G3, ER+, PR-, HER2-) - Signed by [name], MD on 09/25/2022 Histologic grading system: 3 grade system    Genetic Testing   [facility] CancerNext-Expanded Panel+RNA was Negative. Report date is 10/06/2021.  The CancerNext-Expanded gene panel offered by [facility] and includes sequencing, rearrangement, and RNA analysis for the following 77 genes: AIP, ALK, APC, ATM, AXIN2, BAP1, BARD1, BLM, BMPR1A, BRCA1, BRCA2, BRIP1, CDC73, CDH1, CDK4, CDKN1B, CDKN2A, CHEK2, CTNNA1, DICER1, FANCC, FH, FLCN, GALNT12, KIF1B, LZTR1, MAX, MEN1, MET, MLH1, MSH2, MSH3, MSH6, MUTYH, NBN, NF1, NF2, NTHL1, PALB2, PHOX2B, PMS2, POT1, PRKAR1A, PTCH1, PTEN, RAD51C, RAD51D, RB1, RECQL, RET, SDHA, SDHAF2, SDHB, SDHC, SDHD, SMAD4, SMARCA4, SMARCB1, SMARCE1, STK11, SUFU, TMEM127, TP53, TSC1, TSC2, VHL and XRCC2 (sequencing and  deletion/duplication); EGFR, EGLN1, HOXB13, KIT, MITF, PDGFRA, POLD1, and POLE (sequencing only); EPCAM and GREM1 (deletion/duplication only).    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 with high-grade DCIS, inferior margin positive, angiolymphatic invasion present, 5/5 lymph nodes positive, additional medial margin: Poorly differentiated carcinoma with lymphoid stroma, additional inferior margin: Positive, ER 10% weak, PR 0%, HER2 0, Ki-67 30%   10/21/2022 Cancer Staging   Staging form: Breast, AJCC 8th Edition - Pathologic: Stage IIIC (pT1c, pN2a, cM0, G3, ER-, PR-, HER2-) - Signed by [name], MD on 10/21/2022 Stage prefix: Initial diagnosis Histologic grading system: 3 grade system   11/08/2022 Surgery   Margin reexcision: Inferior margin: Grade 3 IDC, new margin positive Medial margin: Grade 3 IDC new margin widely involved   11/18/2022 Surgery   Right inferior margin reexcision: Invasive poorly differentiated adenocarcinoma grade 3 present in new inferior margin.  Medial margin reexcision: Invasive poorly differentiated adenocarcinoma grade 3 present at new medial margin, posterior margin: Poorly differentiated adenocarcinoma grade 3, margins negative   12/02/2022 -  Chemotherapy   Patient is on Treatment Plan : BREAST ADJUVANT DOSE DENSE AC q14d / PACLitaxel q7d       CURRENT THERAPY: weekly Taxol  INTERVAL HISTORY: Caitlin [name] 67 y.o. female returns for f/u prior to receiving weekly Taxol chemotherapy.  She is here to receive her first cycle after completing the first four treatments of dose dense adriamycin and cytoxan.    Kemper was admitted to the hospital last week for right lower lobe pneumonia.  She was discharged on January 28, 2023.  She received 2 doses of IV Rocephin  along with a course of azithromycin which she completes today.  She was also dehydrated, noted with  hyponatremia and hypokalemia.  This had improved upon discharge however her potassium is still slightly decreased at 3.3.  She took oral potassium supplementation and has been drinking and eating potassium rich foods since her discharge.  She is continued on her inhalers with Symbicort and albuterol as needed.  She is feeling better and is continuing to work on regaining her strength.  She does not want to receive chemotherapy today and wants to wait a week to recover.   Patient Active Problem List   Diagnosis Date Noted   Port-A-Cath in place 12/16/2022   Genetic testing 10/11/2022   Appendicitis with peritonitis 09/29/2022   Family history of prostate cancer 09/29/2022   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female, estrogen receptor positive (HCC) 09/22/2022   Atrial fibrillation with RVR (HCC) 11/30/2010   Chronic anticoagulation 11/30/2010   Hypothyroidism 11/30/2010   PALPITATIONS 11/27/2010    is allergic to penicillins.  MEDICAL HISTORY: Past Medical History:  Diagnosis Date   Asthma    Dyspnea    History of kidney stones    Hypothyroidism    rt breast ca 08/2022   Right Breast   Scoliosis    Thyroid disease     SURGICAL HISTORY: Past Surgical History:  Procedure Laterality Date   APPENDECTOMY     AXILLARY SENTINEL NODE BIOPSY Right 10/14/2022   Procedure: RIGHT AXILLARY SENTINEL NODE BIOPSY;  Surgeon: [name], MD;  Location: [facility] OR;  Service: General;  Laterality: Right;   BREAST BIOPSY Right 09/15/2022   [place] RT BREAST BX W LOC DEV 1ST LESION IMG BX SPEC US GUIDE 09/15/2022 [facility] MAMMOGRAPHY   BREAST BIOPSY  10/12/2022   [place] RT RADIOACTIVE SEED LOC 10/12/2022 [facility] MAMMOGRAPHY   BREAST LUMPECTOMY WITH RADIOACTIVE SEED AND SENTINEL LYMPH NODE BIOPSY Right 10/14/2022   Procedure: RIGHT BREAST LUMPECTOMY WITH RADIOACTIVE SEED;  Surgeon: [name], MD;  Location: [facility] OR;  Service: General;  Laterality: Right;   COLONOSCOPY      LAPAROSCOPIC APPENDECTOMY     PORTACATH PLACEMENT Left 10/14/2022   Procedure: INSERTION PORT-A-CATH;  Surgeon: [name], MD;  Location: [facility] OR;  Service: General;  Laterality: Left;   RE-EXCISION OF BREAST LUMPECTOMY Right 11/08/2022   Procedure: RE-EXCISION OF RIGHT BREAST LUMPECTOMY;  Surgeon: [name], MD;  Location: [facility];  Service: General;  Laterality: Right;   RE-EXCISION OF BREAST LUMPECTOMY Right 11/18/2022   Procedure: RE-EXCISION OF RIGHT BREAST LUMPECTOMY;  Surgeon: [name], MD;  Location: [facility] OR;  Service: General;  Laterality: Right;   WISDOM TOOTH EXTRACTION      SOCIAL HISTORY: Social History   Socioeconomic History   Marital status: Married    Spouse name: Not on file   Number of children: Not on file   Years of education: Not on file   Highest education level: Not on file  Occupational History   Not on file  Tobacco Use   Smoking status: Never   Smokeless tobacco: Not on file  Vaping Use   Vaping Use: Never used  Substance and Sexual Activity   Alcohol use: Not Currently   Drug use: Never   Sexual activity: Not Currently  Other Topics Concern   Not on file  Social History Narrative   Not on file   Social Determinants of Health   Financial Resource Strain: Not on file  Food Insecurity: Not  on file  Transportation Needs: Not on file  Physical Activity: Not on file  Stress: Not on file  Social Connections: Not on file  Intimate Partner Violence: Not on file    FAMILY HISTORY: Family History  Problem Relation Age of Onset   Thyroid disease Mother    Prostate cancer Father 73       metastatic   Hypertension Father    Thyroid disease Sister        twin sister age 70   Asthma Sister    Prostate cancer Brother 61   Heart attack Maternal Grandmother        died age 71's   Heart attack Maternal Grandfather        died age 42's   Clotting disorder Paternal Grandmother        cerebral hemorrage     Review of Systems  Constitutional:  Positive for fatigue. Negative for appetite change, chills, fever and unexpected weight change.  HENT:   Negative for hearing loss, lump/mass and trouble swallowing.   Eyes:  Negative for eye problems and icterus.  Respiratory:  Negative for chest tightness, cough and shortness of breath.   Cardiovascular:  Negative for chest pain, leg swelling and palpitations.  Gastrointestinal:  Negative for abdominal distention, abdominal pain, constipation, diarrhea, nausea and vomiting.  Endocrine: Negative for hot flashes.  Genitourinary:  Negative for difficulty urinating.   Musculoskeletal:  Negative for arthralgias.  Skin:  Negative for itching and rash.  Neurological:  Negative for dizziness, extremity weakness, headaches and numbness.  Hematological:  Negative for adenopathy. Does not bruise/bleed easily.  Psychiatric/Behavioral:  Negative for depression. The patient is not nervous/anxious.       PHYSICAL EXAMINATION   Onc Performance Status - 01/31/23 0835       ECOG Perf Status   ECOG Perf Status Fully active, able to carry on all pre-disease performance without restriction      KPS SCALE   KPS % SCORE Able to carry on normal activity, minor s/s of disease             Vitals:   01/31/23 0833  BP: 130/70  Pulse: 93  Resp: 16  Temp: 98.2 F (36.8 C)  SpO2: 97%    Physical Exam Constitutional:      General: She is not in acute distress.    Appearance: Normal appearance. She is not toxic-appearing.  HENT:     Head: Normocephalic and atraumatic.     Mouth/Throat:     Mouth: Mucous membranes are moist.     Pharynx: Oropharynx is clear. No oropharyngeal exudate or posterior oropharyngeal erythema.  Eyes:     General: No scleral icterus. Cardiovascular:     Rate and Rhythm: Normal rate and regular rhythm.     Pulses: Normal pulses.     Heart sounds: Normal heart sounds.  Pulmonary:     Effort: Pulmonary effort is normal.      Breath sounds: Wheezing (right lower lobe) present.  Abdominal:     General: Abdomen is flat. Bowel sounds are normal. There is no distension.     Palpations: Abdomen is soft.     Tenderness: There is no abdominal tenderness.  Musculoskeletal:        General: No swelling.     Cervical back: Neck supple.  Lymphadenopathy:     Cervical: No cervical adenopathy.  Skin:    General: Skin is warm and dry.     Findings: No rash.  Neurological:  General: No focal deficit present.     Mental Status: She is alert.  Psychiatric:        Mood and Affect: Mood normal.        Behavior: Behavior normal.     LABORATORY DATA:  CBC    Component Value Date/Time   WBC 8.5 01/31/2023 0811   WBC 11.1 (H) 10/13/2022 0930   RBC 2.93 (L) 01/31/2023 0811   HGB 9.0 (L) 01/31/2023 0811   HCT 27.0 (L) 01/31/2023 0811   PLT 555 (H) 01/31/2023 0811   MCV 92.2 01/31/2023 0811   MCH 30.7 01/31/2023 0811   MCHC 33.3 01/31/2023 0811   RDW 15.6 (H) 01/31/2023 0811   LYMPHSABS 0.7 01/31/2023 0811   MONOABS 0.8 01/31/2023 0811   EOSABS 0.0 01/31/2023 0811   BASOSABS 0.1 01/31/2023 0811    CMP  Pending      ASSESSMENT and THERAPY PLAN:   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female, estrogen receptor positive (HCC) Dickie is a 67 year old woman with stage IIIc triple negative breast cancer diagnosed in December 2023 status post right lumpectomy, reexcision, now continuing on adjuvant chemotherapy with Adriamycin and Cytoxan to be followed by weekly paclitaxel.  Treatment plan:  1.  Surgery  2.  Adjuvant chemotherapy  3.  Adjuvant radiation therapy  Chemo Toxicities:   Nausea-none recently Constipation-LBM today Dysgeusia-not at this time Right lower lobe PNA: Holding chemotherapy today.  She is completing a course of azithromycin and will be due for repeat chest x-ray in 2 to 3 weeks. Hypokalemia: Her c-Met is pending today.  She is actively eating and drinking  potassium rich foods and will continue to do so. Dehydration: This has resolved.  She will return in 1 week for labs, follow-up, and weekly Taxol chemotherapy.  I reviewed risks, benefits in detail with the patient and her husband.   All questions were answered. The patient knows to call the clinic with any problems, questions or concerns. We can certainly see the patient much sooner if necessary.  Total encounter time:30 minutes*in face-to-face visit time, chart review, lab review, care coordination, order entry, and documentation of the encounter time.    Lillard Anes, NP 01/31/23 9:09 AM Medical Oncology and Hematology [facility] [address] [place], [place] [address] Tel. [phone]    Fax. [phone]  *Total Encounter Time as defined by the Centers for Medicare and Medicaid Services includes, in addition to the face-to-face time of a patient visit (documented in the note above) non-face-to-face time: obtaining and reviewing outside history, ordering and reviewing medications, tests or procedures, care coordination (communications with other health care professionals or caregivers) and documentation in the medical record.

## 2023-01-31 NOTE — Progress Notes (Signed)
Upon arrival to flush room at [facility], pt verbalized she is declining tx after recovery from PNA. She requested to deaccess port before seeing NP. Port deaccessed and NP notified.

## 2023-01-31 NOTE — Assessment & Plan Note (Addendum)
Caitlin [name] is a 67 year old woman with stage IIIc triple negative breast cancer diagnosed in December 2023 status post right lumpectomy, reexcision, now continuing on adjuvant chemotherapy with Adriamycin and Cytoxan to be followed by weekly paclitaxel.  Treatment plan:  1.  Surgery  2.  Adjuvant chemotherapy  3.  Adjuvant radiation therapy  Chemo Toxicities:   Nausea-none recently Constipation-LBM today Dysgeusia-not at this time Right lower lobe PNA: Holding chemotherapy today.  She is completing a course of azithromycin and will be due for repeat chest x-ray in 2 to 3 weeks. Hypokalemia: Her c-Met is pending today.  She is actively eating and drinking potassium rich foods and will continue to do so. Dehydration: This has resolved.  She will return in 1 week for labs, follow-up, and weekly Taxol chemotherapy.  I reviewed risks, benefits in detail with the patient and her husband.

## 2023-02-04 ENCOUNTER — Other Ambulatory Visit: Payer: Self-pay | Admitting: Hematology and Oncology

## 2023-02-04 ENCOUNTER — Telehealth: Payer: Self-pay | Admitting: Hematology and Oncology

## 2023-02-04 DIAGNOSIS — Z170 Estrogen receptor positive status [ER+]: Secondary | ICD-10-CM

## 2023-02-04 MED FILL — Dexamethasone Sodium Phosphate Inj 100 MG/10ML: INTRAMUSCULAR | Qty: 1 | Status: AC

## 2023-02-04 NOTE — Telephone Encounter (Signed)
Scheduled appointments per WQ. Left voicemail. 

## 2023-02-05 ENCOUNTER — Other Ambulatory Visit: Payer: Self-pay

## 2023-02-05 NOTE — Progress Notes (Signed)
Patient Care Team: [name], MD as PCP - General (Unknown Physician Specialty) Pershing Proud, RN as Oncology Nurse Navigator Donnelly Angelica, RN as Oncology Nurse Navigator [name], MD as Consulting Physician (Hematology and Oncology)  DIAGNOSIS: No diagnosis found.  SUMMARY OF ONCOLOGIC HISTORY: Oncology History  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female, estrogen receptor positive (HCC)  09/15/2022 Initial Diagnosis   Screening mammogram detected right breast mass which measured 1.4 cm by ultrasound at 10 o'clock position, biopsy revealed grade 3 IDC with lymphovascular invasion ER 10%, PR 0%, Ki-67 30%, HER2 0 negative   09/25/2022 Cancer Staging   Staging form: Breast, AJCC 8th Edition - Clinical: Stage IB (cT1c, cN0, cM0, G3, ER+, PR-, HER2-) - Signed by [name], MD on 09/25/2022 Histologic grading system: 3 grade system    Genetic Testing   [facility] CancerNext-Expanded Panel+RNA was Negative. Report date is 10/06/2021.  The CancerNext-Expanded gene panel offered by [facility] and includes sequencing, rearrangement, and RNA analysis for the following 77 genes: AIP, ALK, APC, ATM, AXIN2, BAP1, BARD1, BLM, BMPR1A, BRCA1, BRCA2, BRIP1, CDC73, CDH1, CDK4, CDKN1B, CDKN2A, CHEK2, CTNNA1, DICER1, FANCC, FH, FLCN, GALNT12, KIF1B, LZTR1, MAX, MEN1, MET, MLH1, MSH2, MSH3, MSH6, MUTYH, NBN, NF1, NF2, NTHL1, PALB2, PHOX2B, PMS2, POT1, PRKAR1A, PTCH1, PTEN, RAD51C, RAD51D, RB1, RECQL, RET, SDHA, SDHAF2, SDHB, SDHC, SDHD, SMAD4, SMARCA4, SMARCB1, SMARCE1, STK11, SUFU, TMEM127, TP53, TSC1, TSC2, VHL and XRCC2 (sequencing and deletion/duplication); EGFR, EGLN1, HOXB13, KIT, MITF, PDGFRA, POLD1, and POLE (sequencing only); EPCAM and GREM1 (deletion/duplication only).    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 with high-grade DCIS, inferior margin positive,  angiolymphatic invasion present, 5/5 lymph nodes positive, additional medial margin: Poorly differentiated carcinoma with lymphoid stroma, additional inferior margin: Positive, ER 10% weak, PR 0%, HER2 0, Ki-67 30%   10/21/2022 Cancer Staging   Staging form: Breast, AJCC 8th Edition - Pathologic: Stage IIIC (pT1c, pN2a, cM0, G3, ER-, PR-, HER2-) - Signed by [name], MD on 10/21/2022 Stage prefix: Initial diagnosis Histologic grading system: 3 grade system   11/08/2022 Surgery   Margin reexcision: Inferior margin: Grade 3 IDC, new margin positive Medial margin: Grade 3 IDC new margin widely involved   11/18/2022 Surgery   Right inferior margin reexcision: Invasive poorly differentiated adenocarcinoma grade 3 present in new inferior margin.  Medial margin reexcision: Invasive poorly differentiated adenocarcinoma grade 3 present at new medial margin, posterior margin: Poorly differentiated adenocarcinoma grade 3, margins negative   12/02/2022 -  Chemotherapy   Patient is on Treatment Plan : BREAST ADJUVANT DOSE DENSE AC q14d / PACLitaxel q7d       CHIEF COMPLIANT:   INTERVAL HISTORY: Caitlin [name] is a   ALLERGIES:  is allergic to penicillins.  MEDICATIONS:  Current Outpatient Medications  Medication Sig Dispense Refill   acetaminophen (TYLENOL) 500 MG tablet Take 500 mg by mouth every 6 (six) hours as needed for moderate pain.     albuterol (VENTOLIN HFA) 108 (90 Base) MCG/ACT inhaler Inhale 2 puffs into the lungs every 4 (four) hours as needed for shortness of breath (Asthma).     aspirin 81 MG tablet Take 81 mg by mouth daily.       azithromycin (ZITHROMAX) 500 MG tablet Take 500 mg by mouth daily.     benzonatate (TESSALON) 200 MG capsule Take 200 mg by mouth daily as needed for cough.  dexamethasone (DECADRON) 4 MG tablet Take 2 tablets (8 mg total) by mouth daily for 3 days. Start the day after doxorubicin/cyclophosphamide chemotherapy. Take with food. (Patient not taking:  Reported on 12/02/2022) 30 tablet 1   diltiazem (CARDIZEM CD) 120 MG 24 hr capsule Take 120 mg by mouth daily.     levothyroxine (SYNTHROID) 75 MCG tablet Take 75 mcg by mouth daily before breakfast.     lidocaine-prilocaine (EMLA) cream Apply to affected area once 30 g 3   Multiple Vitamins-Minerals (MULTIVITAMIN WITH MINERALS) tablet Take 1 tablet by mouth daily.     ondansetron (ZOFRAN) 8 MG tablet Take 1 tab (8 mg) by mouth every 8 hrs as needed for nausea/vomiting. Start third day after doxorubicin/cyclophosphamide chemotherapy. 30 tablet 1   OVER THE COUNTER MEDICATION Take 1 capsule by mouth 2 (two) times daily. copaiba softgels     prochlorperazine (COMPAZINE) 10 MG tablet TAKE 1 TABLET BY MOUTH EVERY 6 HOURS AS NEEDED FOR NAUSEA / FOR VOMITING 30 tablet 1   PULMICORT FLEXHALER 180 MCG/ACT inhaler Inhale 1 puff into the lungs daily.     traMADol (ULTRAM) 50 MG tablet Take 1 tablet (50 mg total) by mouth every 6 (six) hours as needed. 10 tablet 0   triamcinolone cream (KENALOG) 0.1 % Apply 1 Application topically daily as needed (Dry skin).     No current facility-administered medications for this visit.    PHYSICAL EXAMINATION: ECOG PERFORMANCE STATUS: {CHL ONC ECOG [phone]}  There were no vitals filed for this visit. There were no vitals filed for this visit.  BREAST:*** No palpable masses or nodules in either right or left breasts. No palpable axillary supraclavicular or infraclavicular adenopathy no breast tenderness or nipple discharge. (exam performed in the presence of a chaperone)  LABORATORY DATA:  I have reviewed the data as listed    Latest Ref Rng & Units 01/31/2023    8:11 AM 01/17/2023    7:46 AM 12/30/2022    8:48 AM  CMP  Glucose 70 - 99 mg/dL 161  88  97   BUN 8 - 23 mg/dL 11  20  16    Creatinine 0.44 - 1.00 mg/dL 0.96  0.45  4.09   Sodium 135 - 145 mmol/L 140  139  138   Potassium 3.5 - 5.1 mmol/L 3.8  4.0  3.9   Chloride 98 - 111 mmol/L 105  107  104    CO2 22 - 32 mmol/L 27  26  26    Calcium 8.9 - 10.3 mg/dL 9.4  9.4  9.0   Total Protein 6.5 - 8.1 g/dL 6.5  6.5  6.8   Total Bilirubin 0.3 - 1.2 mg/dL 0.2  0.3  0.2   Alkaline Phos 38 - 126 U/L 76  80  91   AST 15 - 41 U/L 20  15  12    ALT 0 - 44 U/L 24  12  14      Lab Results  Component Value Date   WBC 8.5 01/31/2023   HGB 9.0 (L) 01/31/2023   HCT 27.0 (L) 01/31/2023   MCV 92.2 01/31/2023   PLT 555 (H) 01/31/2023   NEUTROABS 6.8 01/31/2023    ASSESSMENT & PLAN:  No problem-specific Assessment & Plan notes found for this encounter.    No orders of the defined types were placed in this encounter.  The patient has a good understanding of the overall plan. she agrees with it. she will call with any problems that may develop  before the next visit here. Total time spent: 30 mins including face to face time and time spent for planning, charting and co-ordination of care   Sherlyn Lick, CMA 02/05/23    I Janan Ridge am acting as a Neurosurgeon for [employer]  ***

## 2023-02-07 ENCOUNTER — Encounter: Payer: Self-pay | Admitting: Emergency Medicine

## 2023-02-07 ENCOUNTER — Inpatient Hospital Stay: Payer: BC Managed Care – PPO | Attending: Hematology and Oncology

## 2023-02-07 ENCOUNTER — Inpatient Hospital Stay (HOSPITAL_BASED_OUTPATIENT_CLINIC_OR_DEPARTMENT_OTHER): Payer: BC Managed Care – PPO | Admitting: Hematology and Oncology

## 2023-02-07 ENCOUNTER — Inpatient Hospital Stay: Payer: BC Managed Care – PPO

## 2023-02-07 VITALS — BP 131/74 | HR 77 | Temp 97.5°F | Resp 18 | Ht 63.0 in | Wt 147.5 lb

## 2023-02-07 VITALS — BP 130/78 | HR 93 | Temp 98.1°F | Resp 17

## 2023-02-07 DIAGNOSIS — Z5111 Encounter for antineoplastic chemotherapy: Secondary | ICD-10-CM | POA: Insufficient documentation

## 2023-02-07 DIAGNOSIS — Z95828 Presence of other vascular implants and grafts: Secondary | ICD-10-CM

## 2023-02-07 DIAGNOSIS — C50411 Malignant neoplasm of upper-outer quadrant of right female breast: Secondary | ICD-10-CM | POA: Insufficient documentation

## 2023-02-07 DIAGNOSIS — Z79899 Other long term (current) drug therapy: Secondary | ICD-10-CM | POA: Insufficient documentation

## 2023-02-07 DIAGNOSIS — Z170 Estrogen receptor positive status [ER+]: Secondary | ICD-10-CM

## 2023-02-07 LAB — CBC WITH DIFFERENTIAL (CANCER CENTER ONLY)
Abs Immature Granulocytes: 0.13 10*3/uL — ABNORMAL HIGH (ref 0.00–0.07)
Basophils Absolute: 0.1 10*3/uL (ref 0.0–0.1)
Basophils Relative: 2 %
Eosinophils Absolute: 0.2 10*3/uL (ref 0.0–0.5)
Eosinophils Relative: 3 %
HCT: 31.1 % — ABNORMAL LOW (ref 36.0–46.0)
Hemoglobin: 10.3 g/dL — ABNORMAL LOW (ref 12.0–15.0)
Immature Granulocytes: 2 %
Lymphocytes Relative: 13 %
Lymphs Abs: 0.9 10*3/uL (ref 0.7–4.0)
MCH: 31.1 pg (ref 26.0–34.0)
MCHC: 33.1 g/dL (ref 30.0–36.0)
MCV: 94 fL (ref 80.0–100.0)
Monocytes Absolute: 0.7 10*3/uL (ref 0.1–1.0)
Monocytes Relative: 11 %
Neutro Abs: 4.6 10*3/uL (ref 1.7–7.7)
Neutrophils Relative %: 69 %
Platelet Count: 663 10*3/uL — ABNORMAL HIGH (ref 150–400)
RBC: 3.31 MIL/uL — ABNORMAL LOW (ref 3.87–5.11)
RDW: 15.9 % — ABNORMAL HIGH (ref 11.5–15.5)
WBC Count: 6.6 10*3/uL (ref 4.0–10.5)
nRBC: 0 % (ref 0.0–0.2)

## 2023-02-07 LAB — CMP (CANCER CENTER ONLY)
ALT: 13 U/L (ref 0–44)
AST: 12 U/L — ABNORMAL LOW (ref 15–41)
Albumin: 3.9 g/dL (ref 3.5–5.0)
Alkaline Phosphatase: 63 U/L (ref 38–126)
Anion gap: 8 (ref 5–15)
BUN: 13 mg/dL (ref 8–23)
CO2: 26 mmol/L (ref 22–32)
Calcium: 9 mg/dL (ref 8.9–10.3)
Chloride: 105 mmol/L (ref 98–111)
Creatinine: 0.68 mg/dL (ref 0.44–1.00)
GFR, Estimated: >60 mL/min (ref >60)
Glucose, Bld: 103 mg/dL — ABNORMAL HIGH (ref 70–99)
Potassium: 3.9 mmol/L (ref 3.5–5.1)
Sodium: 139 mmol/L (ref 135–145)
Total Bilirubin: 0.2 mg/dL — ABNORMAL LOW (ref 0.3–1.2)
Total Protein: 6.7 g/dL (ref 6.5–8.1)

## 2023-02-07 MED ORDER — DIPHENHYDRAMINE HCL 50 MG/ML IJ SOLN
25.0000 mg | Freq: Once | INTRAMUSCULAR | Status: AC
  Administered 2023-02-07: 25 mg via INTRAVENOUS
  Filled 2023-02-07: qty 1

## 2023-02-07 MED ORDER — SODIUM CHLORIDE 0.9 % IV SOLN
70.0000 mg/m2 | Freq: Once | INTRAVENOUS | Status: AC
  Administered 2023-02-07: 126 mg via INTRAVENOUS
  Filled 2023-02-07: qty 21

## 2023-02-07 MED ORDER — SODIUM CHLORIDE 0.9 % IV SOLN
10.0000 mg | Freq: Once | INTRAVENOUS | Status: AC
  Administered 2023-02-07: 10 mg via INTRAVENOUS
  Filled 2023-02-07: qty 10

## 2023-02-07 MED ORDER — SODIUM CHLORIDE 0.9% FLUSH
10.0000 mL | Freq: Once | INTRAVENOUS | Status: AC
  Administered 2023-02-07: 10 mL

## 2023-02-07 MED ORDER — FAMOTIDINE 20 MG IN NS 100 ML IVPB
20.0000 mg | Freq: Once | INTRAVENOUS | Status: AC
  Administered 2023-02-07: 20 mg via INTRAVENOUS
  Filled 2023-02-07: qty 100

## 2023-02-07 MED ORDER — HEPARIN SOD (PORK) LOCK FLUSH 100 UNIT/ML IV SOLN
500.0000 [IU] | Freq: Once | INTRAVENOUS | Status: AC | PRN
  Administered 2023-02-07: 500 [IU]

## 2023-02-07 MED ORDER — SODIUM CHLORIDE 0.9% FLUSH
10.0000 mL | INTRAVENOUS | Status: DC | PRN
  Administered 2023-02-07: 10 mL

## 2023-02-07 MED ORDER — SODIUM CHLORIDE 0.9 % IV SOLN: Freq: Once | INTRAVENOUS | Status: AC

## 2023-02-07 NOTE — Patient Instructions (Addendum)
[place] [facility]  Discharge Instructions: Thank you for choosing [facility] to provide your oncology and hematology care.   If you have a lab appointment with the Cancer Center, please go directly to the Cancer Center and check in at the registration area.   Wear comfortable clothing and clothing appropriate for easy access to any Portacath or PICC line.   We strive to give you quality time with your provider. You may need to reschedule your appointment if you arrive late (15 or more minutes).  Arriving late affects you and other patients whose appointments are after yours.  Also, if you miss three or more appointments without notifying the office, you may be dismissed from the clinic at the provider's discretion.      For prescription refill requests, have your pharmacy contact our office and allow 72 hours for refills to be completed.    Today you received the following chemotherapy and/or immunotherapy agents: TAXOL (paclitaxel)      To help prevent nausea and vomiting after your treatment, we encourage you to take your nausea medication as directed.  BELOW ARE SYMPTOMS THAT SHOULD BE REPORTED IMMEDIATELY: *FEVER GREATER THAN 100.4 F (38 C) OR HIGHER *CHILLS OR SWEATING *NAUSEA AND VOMITING THAT IS NOT CONTROLLED WITH YOUR NAUSEA MEDICATION *UNUSUAL SHORTNESS OF BREATH *UNUSUAL BRUISING OR BLEEDING *URINARY PROBLEMS (pain or burning when urinating, or frequent urination) *BOWEL PROBLEMS (unusual diarrhea, constipation, pain near the anus) TENDERNESS IN MOUTH AND THROAT WITH OR WITHOUT PRESENCE OF ULCERS (sore throat, sores in mouth, or a toothache) UNUSUAL RASH, SWELLING OR PAIN  UNUSUAL VAGINAL DISCHARGE OR ITCHING   Items with * indicate a potential emergency and should be followed up as soon as possible or go to the Emergency Department if any problems should occur.  Please show the CHEMOTHERAPY ALERT CARD or IMMUNOTHERAPY ALERT CARD  at check-in to the Emergency Department and triage nurse.  Should you have questions after your visit or need to cancel or reschedule your appointment, please contact [place] [facility]  Dept: [phone]  and follow the prompts.  Office hours are 8:00 a.m. to 4:30 p.m. Monday - Friday. Please note that voicemails left after 4:00 p.m. may not be returned until the following business day.  We are closed weekends and major holidays. You have access to a nurse at all times for urgent questions. Please call the main number to the clinic Dept: [phone] and follow the prompts.   For any non-urgent questions, you may also contact your provider using MyChart. We now offer e-Visits for anyone 59 and older to request care online for non-urgent symptoms. For details visit [email or web address].   Also download the MyChart app! Go to the app store, search "MyChart", open the app, select , and log in with your MyChart username and password.  Paclitaxel Injection What is this medication? PACLITAXEL (PAK li TAX el) treats some types of cancer. It works by slowing down the growth of cancer cells. This medicine may be used for other purposes; ask your health care provider or pharmacist if you have questions. COMMON BRAND NAME(S): Onxol, Taxol What should I tell my care team before I take this medication? They need to know if you have any of these conditions: Heart disease Liver disease Low white blood cell levels An unusual or allergic reaction to paclitaxel, other medications, foods, dyes, or preservatives If you or your partner are pregnant or trying to  get pregnant Breast-feeding How should I use this medication? This medication is injected into a vein. It is given by your care team in a hospital or clinic setting. Talk to your care team about the use of this medication in children. While it may be given to children for selected conditions, precautions do  apply. Overdosage: If you think you have taken too much of this medicine contact a poison control center or emergency room at once. NOTE: This medicine is only for you. Do not share this medicine with others. What if I miss a dose? Keep appointments for follow-up doses. It is important not to miss your dose. Call your care team if you are unable to keep an appointment. What may interact with this medication? Do not take this medication with any of the following: Live virus vaccines Other medications may affect the way this medication works. Talk with your care team about all of the medications you take. They may suggest changes to your treatment plan to lower the risk of side effects and to make sure your medications work as intended. This list may not describe all possible interactions. Give your health care provider a list of all the medicines, herbs, non-prescription drugs, or dietary supplements you use. Also tell them if you smoke, drink alcohol, or use illegal drugs. Some items may interact with your medicine. What should I watch for while using this medication? Your condition will be monitored carefully while you are receiving this medication. You may need blood work while taking this medication. This medication may make you feel generally unwell. This is not uncommon as chemotherapy can affect healthy cells as well as cancer cells. Report any side effects. Continue your course of treatment even though you feel ill unless your care team tells you to stop. This medication can cause serious allergic reactions. To reduce the risk, your care team may give you other medications to take before receiving this one. Be sure to follow the directions from your care team. This medication may increase your risk of getting an infection. Call your care team for advice if you get a fever, chills, sore throat, or other symptoms of a cold or flu. Do not treat yourself. Try to avoid being around people who are  sick. This medication may increase your risk to bruise or bleed. Call your care team if you notice any unusual bleeding. Be careful brushing or flossing your teeth or using a toothpick because you may get an infection or bleed more easily. If you have any dental work done, tell your dentist you are receiving this medication. Talk to your care team if you may be pregnant. Serious birth defects can occur if you take this medication during pregnancy. Talk to your care team before breastfeeding. Changes to your treatment plan may be needed. What side effects may I notice from receiving this medication? Side effects that you should report to your care team as soon as possible: Allergic reactions--skin rash, itching, hives, swelling of the face, lips, tongue, or throat Heart rhythm changes--fast or irregular heartbeat, dizziness, feeling faint or lightheaded, chest pain, trouble breathing Increase in blood pressure Infection--fever, chills, cough, sore throat, wounds that don't heal, pain or trouble when passing urine, general feeling of discomfort or being unwell Low blood pressure--dizziness, feeling faint or lightheaded, blurry vision Low red blood cell level--unusual weakness or fatigue, dizziness, headache, trouble breathing Painful swelling, warmth, or redness of the skin, blisters or sores at the infusion site Pain, tingling, or numbness  in the hands or feet Slow heartbeat--dizziness, feeling faint or lightheaded, confusion, trouble breathing, unusual weakness or fatigue Unusual bruising or bleeding Side effects that usually do not require medical attention (report to your care team if they continue or are bothersome): Diarrhea Hair loss Joint pain Loss of appetite Muscle pain Nausea Vomiting This list may not describe all possible side effects. Call your doctor for medical advice about side effects. You may report side effects to FDA at 1-800-FDA-1088. Where should I keep my  medication? This medication is given in a hospital or clinic. It will not be stored at home. NOTE: This sheet is a summary. It may not cover all possible information. If you have questions about this medicine, talk to your doctor, pharmacist, or health care provider.  2023 Elsevier/Gold Standard (2022-02-04 00:00:00)

## 2023-02-07 NOTE — Assessment & Plan Note (Signed)
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  No distant metastatic disease. Bone scan 10/29/2022: No evidence of bone metastasis   11/08/2022 and 11/18/2022: Margin reexcision surgeries: Final margin focal positivity.  After much discussion the plan is to finish her systemic treatment and come back at a later point to do mastectomy.   Treatment plan: Adjuvant chemotherapy with dose dense Adriamycin and Cytoxan followed by Taxol  Adjuvant radiation ------------------------------------------------------------------------------------------------------------------------------------------ Current treatment: Completed 4 cycles of dose dense Adriamycin and Cytoxan, today cycle 1 Taxol Chemo toxicities: Nausea: Grade 1 Constipation Alopecia Metallic taste in the mouth Right lower lobe pneumonia: Held treatment on 01/31/2023 completed antibiotics.  Resume chemotherapy today. Return to clinic weekly for Taxol treatments

## 2023-02-07 NOTE — Research (Signed)
DCP-001: Use of a Clinical Trial Screening Tool to Address Cancer Health Disparities in the [employer] Community Oncology Research Program [place])  CONSENT  Patient Caitlin [name] was identified by Clinical Research as a potential candidate for the above listed study.  This Clinical Research Nurse met with Caitlin [name], [name] on 02/07/23 in a manner and location that ensures patient privacy to discuss participation in the above listed research study.  Patient is Unaccompanied.  Patient was previously provided with informed consent documents.  Patient has not yet read the informed consent documents and so documents were reviewed page by page today.  As outlined in the informed consent form, this Nurse and [name] discussed the purpose of the research study, the investigational nature of the study, study procedures and requirements for study participation, potential risks and benefits of study participation, as well as alternatives to participation.  This study is not blinded or double-blinded. The patient understands participation is voluntary and they may withdraw from study participation at any time.  This study does not involve randomization.  This study does not involve an investigational drug or device. This study does not involve a placebo. Patient understands enrollment is pending full eligibility review.   Confidentiality and how the patient's information will be used as part of study participation were discussed.  Patient was informed there is not reimbursement provided for their time and effort spent on trial participation.  The patient is encouraged to discuss research study participation with their insurance provider to determine what costs they may incur as part of study participation, including research related injury.    All questions were answered to patient's satisfaction.  The informed consent and separate HIPAA Authorization was reviewed page by page.  The patient's mental  and emotional status is appropriate to provide informed consent, and the patient verbalizes an understanding of study participation.  Patient has agreed to participate in the above listed research study and has voluntarily signed the informed consent version 11/02/21 (cone date 05/21/22) and separate HIPAA Authorization, version 07/14/21 (cone date 07/22/22)  on 02/07/23 at 9:11 AM.  The patient was provided with a copy of the signed informed consent form and separate HIPAA Authorization for their reference.  No study specific procedures were obtained prior to the signing of the informed consent document.  Approximately 20 minutes were spent with the patient reviewing the informed consent documents.  Patient was not requested to complete a Release of Information form.  [name] '[name]' [name], RN, BSN, [facility] Clinical Research Nurse I 02/07/23 9:23 AM

## 2023-02-09 ENCOUNTER — Telehealth: Payer: Self-pay | Admitting: Hematology and Oncology

## 2023-02-09 NOTE — Telephone Encounter (Signed)
Scheduled appointments per WQ. Left voicemail. 

## 2023-02-10 ENCOUNTER — Other Ambulatory Visit: Payer: Self-pay

## 2023-02-10 NOTE — Progress Notes (Signed)
Patient Care Team: Caitlin Chimera, MD as PCP - General (Unknown Physician Specialty) Caitlin Proud, RN as Oncology Nurse Navigator Caitlin Angelica, RN as Oncology Nurse Navigator Caitlin Croissant, MD as Consulting Physician (Hematology and Oncology)  DIAGNOSIS:  Encounter Diagnosis  Name Primary?   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female, estrogen receptor positive (HCC) Yes    SUMMARY OF ONCOLOGIC HISTORY: Oncology History  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female, estrogen receptor positive (HCC)  09/15/2022 Initial Diagnosis   Screening mammogram detected right breast mass which measured 1.4 cm by ultrasound at 10 o'clock position, biopsy revealed grade 3 IDC with lymphovascular invasion ER 10%, PR 0%, Ki-67 30%, HER2 0 negative   09/25/2022 Cancer Staging   Staging form: Breast, AJCC 8th Edition - Clinical: Stage IB (cT1c, cN0, cM0, G3, ER+, PR-, HER2-) - Signed by Caitlin Croissant, MD on 09/25/2022 Histologic grading system: 3 grade system    Genetic Testing   [facility] CancerNext-Expanded Panel+RNA was Negative. Report date is 10/06/2021.  The CancerNext-Expanded gene panel offered by [facility] and includes sequencing, rearrangement, and RNA analysis for the following 77 genes: AIP, ALK, APC, ATM, AXIN2, BAP1, BARD1, BLM, BMPR1A, BRCA1, BRCA2, BRIP1, CDC73, CDH1, CDK4, CDKN1B, CDKN2A, CHEK2, CTNNA1, DICER1, FANCC, FH, FLCN, GALNT12, KIF1B, LZTR1, MAX, MEN1, MET, MLH1, MSH2, MSH3, MSH6, MUTYH, NBN, NF1, NF2, NTHL1, PALB2, PHOX2B, PMS2, POT1, PRKAR1A, PTCH1, PTEN, RAD51C, RAD51D, RB1, RECQL, RET, SDHA, SDHAF2, SDHB, SDHC, SDHD, SMAD4, SMARCA4, SMARCB1, SMARCE1, STK11, SUFU, TMEM127, TP53, TSC1, TSC2, VHL and XRCC2 (sequencing and deletion/duplication); EGFR, EGLN1, HOXB13, KIT, MITF, PDGFRA, POLD1, and POLE (sequencing only); EPCAM and GREM1 (deletion/duplication only).    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 with focal lobular features with high-grade DCIS, inferior margin positive, angiolymphatic invasion present, 5/5 lymph nodes positive, additional medial margin: Poorly differentiated carcinoma with lymphoid stroma, additional inferior margin: Positive, ER 10% weak, PR 0%, HER2 0, Ki-67 30%   10/21/2022 Cancer Staging   Staging form: Breast, AJCC 8th Edition - Pathologic: Stage IIIC (pT1c, pN2a, cM0, G3, ER-, PR-, HER2-) - Signed by Caitlin Croissant, MD on 10/21/2022 Stage prefix: Initial diagnosis Histologic grading system: 3 grade system   11/08/2022 Surgery   Margin reexcision: Inferior margin: Grade 3 IDC, new margin positive Medial margin: Grade 3 IDC new margin widely involved   11/18/2022 Surgery   Right inferior margin reexcision: Invasive poorly differentiated adenocarcinoma grade 3 present in new inferior margin.  Medial margin reexcision: Invasive poorly differentiated adenocarcinoma grade 3 present at new medial margin, posterior margin: Poorly differentiated adenocarcinoma grade 3, margins negative   12/02/2022 -  Chemotherapy   Patient is on Treatment Plan : BREAST ADJUVANT DOSE DENSE AC q14d / PACLitaxel q7d       CHIEF COMPLIANT: cycle 2 Taxol   INTERVAL HISTORY: Caitlin [name] is a 67 y.o. female is here because of recent of right breast cancer.  Currently on Taxol. She presents to the clinic for a follow-up. She reports that she had some mild nausea the day after and some fatigue. She says the appetite is doing ok. Denies losing any weight. Denies any neuropathy in fingers or toes.   ALLERGIES:  is allergic to penicillins.  MEDICATIONS:  Current Outpatient Medications  Medication Sig Dispense Refill   acetaminophen (TYLENOL) 500 MG tablet Take 500 mg by mouth every 6 (six) hours as needed for moderate pain.  albuterol (VENTOLIN HFA) 108 (90 Base) MCG/ACT inhaler Inhale 2 puffs into the lungs every 4 (four) hours as needed  for shortness of breath (Asthma).     aspirin 81 MG tablet Take 81 mg by mouth daily.       azithromycin (ZITHROMAX) 500 MG tablet Take 500 mg by mouth daily.     benzonatate (TESSALON) 200 MG capsule Take 200 mg by mouth daily as needed for cough.     diltiazem (CARDIZEM CD) 120 MG 24 hr capsule Take 120 mg by mouth daily.     levothyroxine (SYNTHROID) 75 MCG tablet Take 75 mcg by mouth daily before breakfast.     lidocaine-prilocaine (EMLA) cream Apply to affected area once 30 g 3   Multiple Vitamins-Minerals (MULTIVITAMIN WITH MINERALS) tablet Take 1 tablet by mouth daily.     ondansetron (ZOFRAN) 8 MG tablet Take 1 tab (8 mg) by mouth every 8 hrs as needed for nausea/vomiting. Start third day after doxorubicin/cyclophosphamide chemotherapy. 30 tablet 1   OVER THE COUNTER MEDICATION Take 1 capsule by mouth 2 (two) times daily. copaiba softgels     prochlorperazine (COMPAZINE) 10 MG tablet TAKE 1 TABLET BY MOUTH EVERY 6 HOURS AS NEEDED FOR NAUSEA / FOR VOMITING 30 tablet 1   PULMICORT FLEXHALER 180 MCG/ACT inhaler Inhale 1 puff into the lungs daily.     traMADol (ULTRAM) 50 MG tablet Take 1 tablet (50 mg total) by mouth every 6 (six) hours as needed. 10 tablet 0   triamcinolone cream (KENALOG) 0.1 % Apply 1 Application topically daily as needed (Dry skin).     dexamethasone (DECADRON) 4 MG tablet Take 2 tablets (8 mg total) by mouth daily for 3 days. Start the day after doxorubicin/cyclophosphamide chemotherapy. Take with food. (Patient not taking: Reported on 02/14/2023) 30 tablet 1   No current facility-administered medications for this visit.    PHYSICAL EXAMINATION: ECOG PERFORMANCE STATUS: 1 - Symptomatic but completely ambulatory  Vitals:   02/14/23 0806  BP: 131/76  Pulse: 75  Resp: 18  Temp: (!) 97.3 F (36.3 C)  SpO2: 99%   Filed Weights   02/14/23 0806  Weight: 147 lb 11.2 oz (67 kg)      LABORATORY DATA:  I have reviewed the data as listed    Latest Ref Rng & Units  02/07/2023    7:48 AM 01/31/2023    8:11 AM 01/17/2023    7:46 AM  CMP  Glucose 70 - 99 mg/dL 161  096  88   BUN 8 - 23 mg/dL 13  11  20    Creatinine 0.44 - 1.00 mg/dL 0.45  4.09  8.11   Sodium 135 - 145 mmol/L 139  140  139   Potassium 3.5 - 5.1 mmol/L 3.9  3.8  4.0   Chloride 98 - 111 mmol/L 105  105  107   CO2 22 - 32 mmol/L 26  27  26    Calcium 8.9 - 10.3 mg/dL 9.0  9.4  9.4   Total Protein 6.5 - 8.1 g/dL 6.7  6.5  6.5   Total Bilirubin 0.3 - 1.2 mg/dL 0.2  0.2  0.3   Alkaline Phos 38 - 126 U/L 63  76  80   AST 15 - 41 U/L 12  20  15    ALT 0 - 44 U/L 13  24  12      Lab Results  Component Value Date   WBC 4.8 02/14/2023   HGB 10.5 (L) 02/14/2023  HCT 32.5 (L) 02/14/2023   MCV 95.9 02/14/2023   PLT 382 02/14/2023   NEUTROABS 3.1 02/14/2023    ASSESSMENT & PLAN:  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female, estrogen receptor positive (HCC) 10/14/2022:Right lumpectomy: 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  No distant metastatic disease. Bone scan 10/29/2022: No evidence of bone metastasis   11/08/2022 and 11/18/2022: Margin reexcision surgeries: Final margin focal positivity.  After much discussion the plan is to finish her systemic treatment and come back at a later point to do mastectomy.   Treatment plan: Adjuvant chemotherapy with dose dense Adriamycin and Cytoxan followed by Taxol  Adjuvant  radiation ------------------------------------------------------------------------------------------------------------------------------------------ Current treatment: Completed 4 cycles of dose dense Adriamycin and Cytoxan, today is cycle 2 Taxol Chemo toxicities: Nausea: Grade 1 Alopecia Right lower lobe pneumonia: Held treatment on 01/31/2023 completed antibiotics.  Resume chemotherapy today. Weight loss: Stabilized since Taxol was started Fatigue  We are monitoring her blood counts closely.  Her neutrophil count will be watched and if necessary Neupogen will be considered.  Her grandchildren are coming into town in June and she is excited about that. Return to clinic weekly for Taxol treatments    No orders of the defined types were placed in this encounter.  The patient has a good understanding of the overall plan. she agrees with it. she will call with any problems that may develop before the next visit here. Total time spent: 30 mins including face to face time and time spent for planning, charting and co-ordination of care   Caitlin Meek, MD 02/14/23    I Janan Ridge am acting as a Neurosurgeon for [employer]  I have reviewed the above documentation for accuracy and completeness, and I agree with the above.

## 2023-02-11 MED FILL — Dexamethasone Sodium Phosphate Inj 100 MG/10ML: INTRAMUSCULAR | Qty: 1 | Status: AC

## 2023-02-12 ENCOUNTER — Encounter: Payer: Self-pay | Admitting: Hematology and Oncology

## 2023-02-14 ENCOUNTER — Inpatient Hospital Stay: Payer: BC Managed Care – PPO

## 2023-02-14 ENCOUNTER — Inpatient Hospital Stay (HOSPITAL_BASED_OUTPATIENT_CLINIC_OR_DEPARTMENT_OTHER): Payer: BC Managed Care – PPO | Admitting: Hematology and Oncology

## 2023-02-14 ENCOUNTER — Inpatient Hospital Stay: Payer: BC Managed Care – PPO | Admitting: Hematology and Oncology

## 2023-02-14 VITALS — BP 131/76 | HR 75 | Temp 97.3°F | Resp 18 | Ht 63.0 in | Wt 147.7 lb

## 2023-02-14 DIAGNOSIS — Z170 Estrogen receptor positive status [ER+]: Secondary | ICD-10-CM

## 2023-02-14 DIAGNOSIS — Z5111 Encounter for antineoplastic chemotherapy: Secondary | ICD-10-CM | POA: Diagnosis not present

## 2023-02-14 DIAGNOSIS — Z95828 Presence of other vascular implants and grafts: Secondary | ICD-10-CM

## 2023-02-14 DIAGNOSIS — C50411 Malignant neoplasm of upper-outer quadrant of right female breast: Secondary | ICD-10-CM

## 2023-02-14 LAB — CMP (CANCER CENTER ONLY)
ALT: 11 U/L (ref 0–44)
AST: 13 U/L — ABNORMAL LOW (ref 15–41)
Albumin: 4 g/dL (ref 3.5–5.0)
Alkaline Phosphatase: 67 U/L (ref 38–126)
Anion gap: 7 (ref 5–15)
BUN: 17 mg/dL (ref 8–23)
CO2: 26 mmol/L (ref 22–32)
Calcium: 9 mg/dL (ref 8.9–10.3)
Chloride: 107 mmol/L (ref 98–111)
Creatinine: 0.66 mg/dL (ref 0.44–1.00)
GFR, Estimated: >60 mL/min (ref >60)
Glucose, Bld: 97 mg/dL (ref 70–99)
Potassium: 3.9 mmol/L (ref 3.5–5.1)
Sodium: 140 mmol/L (ref 135–145)
Total Bilirubin: 0.3 mg/dL (ref 0.3–1.2)
Total Protein: 6.5 g/dL (ref 6.5–8.1)

## 2023-02-14 LAB — CBC WITH DIFFERENTIAL (CANCER CENTER ONLY)
Abs Immature Granulocytes: 0.05 10*3/uL (ref 0.00–0.07)
Basophils Absolute: 0.1 10*3/uL (ref 0.0–0.1)
Basophils Relative: 2 %
Eosinophils Absolute: 0.4 10*3/uL (ref 0.0–0.5)
Eosinophils Relative: 8 %
HCT: 32.5 % — ABNORMAL LOW (ref 36.0–46.0)
Hemoglobin: 10.5 g/dL — ABNORMAL LOW (ref 12.0–15.0)
Immature Granulocytes: 1 %
Lymphocytes Relative: 13 %
Lymphs Abs: 0.6 10*3/uL — ABNORMAL LOW (ref 0.7–4.0)
MCH: 31 pg (ref 26.0–34.0)
MCHC: 32.3 g/dL (ref 30.0–36.0)
MCV: 95.9 fL (ref 80.0–100.0)
Monocytes Absolute: 0.6 10*3/uL (ref 0.1–1.0)
Monocytes Relative: 12 %
Neutro Abs: 3.1 10*3/uL (ref 1.7–7.7)
Neutrophils Relative %: 64 %
Platelet Count: 382 10*3/uL (ref 150–400)
RBC: 3.39 MIL/uL — ABNORMAL LOW (ref 3.87–5.11)
RDW: 16.6 % — ABNORMAL HIGH (ref 11.5–15.5)
WBC Count: 4.8 10*3/uL (ref 4.0–10.5)
nRBC: 0 % (ref 0.0–0.2)

## 2023-02-14 MED ORDER — SODIUM CHLORIDE 0.9% FLUSH
10.0000 mL | INTRAVENOUS | Status: DC | PRN
  Administered 2023-02-14: 10 mL

## 2023-02-14 MED ORDER — SODIUM CHLORIDE 0.9 % IV SOLN: Freq: Once | INTRAVENOUS | Status: AC

## 2023-02-14 MED ORDER — SODIUM CHLORIDE 0.9 % IV SOLN
70.0000 mg/m2 | Freq: Once | INTRAVENOUS | Status: AC
  Administered 2023-02-14: 126 mg via INTRAVENOUS
  Filled 2023-02-14: qty 21

## 2023-02-14 MED ORDER — FAMOTIDINE 20 MG IN NS 100 ML IVPB
20.0000 mg | Freq: Once | INTRAVENOUS | Status: AC
  Administered 2023-02-14: 20 mg via INTRAVENOUS
  Filled 2023-02-14: qty 100

## 2023-02-14 MED ORDER — HEPARIN SOD (PORK) LOCK FLUSH 100 UNIT/ML IV SOLN
500.0000 [IU] | Freq: Once | INTRAVENOUS | Status: AC | PRN
  Administered 2023-02-14: 500 [IU]

## 2023-02-14 MED ORDER — DIPHENHYDRAMINE HCL 50 MG/ML IJ SOLN
25.0000 mg | Freq: Once | INTRAMUSCULAR | Status: AC
  Administered 2023-02-14: 25 mg via INTRAVENOUS
  Filled 2023-02-14: qty 1

## 2023-02-14 MED ORDER — SODIUM CHLORIDE 0.9% FLUSH
10.0000 mL | Freq: Once | INTRAVENOUS | Status: AC
  Administered 2023-02-14: 10 mL

## 2023-02-14 MED ORDER — SODIUM CHLORIDE 0.9 % IV SOLN
10.0000 mg | Freq: Once | INTRAVENOUS | Status: AC
  Administered 2023-02-14: 10 mg via INTRAVENOUS
  Filled 2023-02-14: qty 10

## 2023-02-14 NOTE — Assessment & Plan Note (Addendum)
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  No distant metastatic disease. Bone scan 10/29/2022: No evidence of bone metastasis   11/08/2022 and 11/18/2022: Margin reexcision surgeries: Final margin focal positivity.  After much discussion the plan is to finish her systemic treatment and come back at a later point to do mastectomy.   Treatment plan: Adjuvant chemotherapy with dose dense Adriamycin and Cytoxan followed by Taxol  Adjuvant radiation ------------------------------------------------------------------------------------------------------------------------------------------ Current treatment: Completed 4 cycles of dose dense Adriamycin and Cytoxan, today is cycle 2 Taxol Chemo toxicities: Nausea: Grade 1 Alopecia Right lower lobe pneumonia: Held treatment on 01/31/2023 completed antibiotics.  Resume chemotherapy today. Weight loss: Stabilized since Taxol was started Fatigue  We are monitoring her blood counts closely.  Her neutrophil count will be watched and if necessary Neupogen will be considered.  Her grandchildren are coming into town in June and she is excited about that. Return to clinic weekly for Taxol treatments

## 2023-02-14 NOTE — Patient Instructions (Signed)
[facility]  Discharge Instructions: Thank you for choosing [facility] to provide your oncology and hematology care.   If you have a lab appointment with the Cancer Center, please go directly to the Cancer Center and check in at the registration area.   Wear comfortable clothing and clothing appropriate for easy access to any Portacath or PICC line.   We strive to give you quality time with your provider. You may need to reschedule your appointment if you arrive late (15 or more minutes).  Arriving late affects you and other patients whose appointments are after yours.  Also, if you miss three or more appointments without notifying the office, you may be dismissed from the clinic at the provider's discretion.      For prescription refill requests, have your pharmacy contact our office and allow 72 hours for refills to be completed.    Today you received the following chemotherapy and/or immunotherapy agents: paclitaxel      To help prevent nausea and vomiting after your treatment, we encourage you to take your nausea medication as directed.  BELOW ARE SYMPTOMS THAT SHOULD BE REPORTED IMMEDIATELY: *FEVER GREATER THAN 100.4 F (38 C) OR HIGHER *CHILLS OR SWEATING *NAUSEA AND VOMITING THAT IS NOT CONTROLLED WITH YOUR NAUSEA MEDICATION *UNUSUAL SHORTNESS OF BREATH *UNUSUAL BRUISING OR BLEEDING *URINARY PROBLEMS (pain or burning when urinating, or frequent urination) *BOWEL PROBLEMS (unusual diarrhea, constipation, pain near the anus) TENDERNESS IN MOUTH AND THROAT WITH OR WITHOUT PRESENCE OF ULCERS (sore throat, sores in mouth, or a toothache) UNUSUAL RASH, SWELLING OR PAIN  UNUSUAL VAGINAL DISCHARGE OR ITCHING   Items with * indicate a potential emergency and should be followed up as soon as possible or go to the Emergency Department if any problems should occur.  Please show the CHEMOTHERAPY ALERT CARD or IMMUNOTHERAPY ALERT CARD at  check-in to the Emergency Department and triage nurse.  Should you have questions after your visit or need to cancel or reschedule your appointment, please contact [facility]  Dept: [phone]  and follow the prompts.  Office hours are 8:00 a.m. to 4:30 p.m. Monday - Friday. Please note that voicemails left after 4:00 p.m. may not be returned until the following business day.  We are closed weekends and major holidays. You have access to a nurse at all times for urgent questions. Please call the main number to the clinic Dept: [phone] and follow the prompts.   For any non-urgent questions, you may also contact your provider using MyChart. We now offer e-Visits for anyone 18 and older to request care online for non-urgent symptoms. For details visit [email or web address].   Also download the MyChart app! Go to the app store, search "MyChart", open the app, select [place], and log in with your MyChart username and password.   

## 2023-02-16 ENCOUNTER — Other Ambulatory Visit: Payer: Self-pay

## 2023-02-16 ENCOUNTER — Encounter: Payer: Self-pay | Admitting: *Deleted

## 2023-02-16 NOTE — Progress Notes (Signed)
Patient Care Team: [name], MD as PCP - General (Unknown Physician Specialty) Pershing Proud, RN as Oncology Nurse Navigator Donnelly Angelica, RN as Oncology Nurse Navigator [name], MD as Consulting Physician (Hematology and Oncology)  DIAGNOSIS:  Encounter Diagnosis  Name Primary?   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female, estrogen receptor positive (HCC) Yes    SUMMARY OF ONCOLOGIC HISTORY: Oncology History  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female, estrogen receptor positive (HCC)  09/15/2022 Initial Diagnosis   Screening mammogram detected right breast mass which measured 1.4 cm by ultrasound at 10 o'clock position, biopsy revealed grade 3 IDC with lymphovascular invasion ER 10%, PR 0%, Ki-67 30%, HER2 0 negative   09/25/2022 Cancer Staging   Staging form: Breast, AJCC 8th Edition - Clinical: Stage IB (cT1c, cN0, cM0, G3, ER+, PR-, HER2-) - Signed by [name], MD on 09/25/2022 Histologic grading system: 3 grade system    Genetic Testing   [facility] CancerNext-Expanded Panel+RNA was Negative. Report date is 10/06/2021.  The CancerNext-Expanded gene panel offered by [facility] and includes sequencing, rearrangement, and RNA analysis for the following 77 genes: AIP, ALK, APC, ATM, AXIN2, BAP1, BARD1, BLM, BMPR1A, BRCA1, BRCA2, BRIP1, CDC73, CDH1, CDK4, CDKN1B, CDKN2A, CHEK2, CTNNA1, DICER1, FANCC, FH, FLCN, GALNT12, KIF1B, LZTR1, MAX, MEN1, MET, MLH1, MSH2, MSH3, MSH6, MUTYH, NBN, NF1, NF2, NTHL1, PALB2, PHOX2B, PMS2, POT1, PRKAR1A, PTCH1, PTEN, RAD51C, RAD51D, RB1, RECQL, RET, SDHA, SDHAF2, SDHB, SDHC, SDHD, SMAD4, SMARCA4, SMARCB1, SMARCE1, STK11, SUFU, TMEM127, TP53, TSC1, TSC2, VHL and XRCC2 (sequencing and deletion/duplication); EGFR, EGLN1, HOXB13, KIT, MITF, PDGFRA, POLD1, and POLE (sequencing only); EPCAM and GREM1 (deletion/duplication only).    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 with focal lobular features with high-grade DCIS, inferior margin positive, angiolymphatic invasion present, 5/5 lymph nodes positive, additional medial margin: Poorly differentiated carcinoma with lymphoid stroma, additional inferior margin: Positive, ER 10% weak, PR 0%, HER2 0, Ki-67 30%   10/21/2022 Cancer Staging   Staging form: Breast, AJCC 8th Edition - Pathologic: Stage IIIC (pT1c, pN2a, cM0, G3, ER-, PR-, HER2-) - Signed by [name], MD on 10/21/2022 Stage prefix: Initial diagnosis Histologic grading system: 3 grade system   11/08/2022 Surgery   Margin reexcision: Inferior margin: Grade 3 IDC, new margin positive Medial margin: Grade 3 IDC new margin widely involved   11/18/2022 Surgery   Right inferior margin reexcision: Invasive poorly differentiated adenocarcinoma grade 3 present in new inferior margin.  Medial margin reexcision: Invasive poorly differentiated adenocarcinoma grade 3 present at new medial margin, posterior margin: Poorly differentiated adenocarcinoma grade 3, margins negative   12/02/2022 -  Chemotherapy   Patient is on Treatment Plan : BREAST ADJUVANT DOSE DENSE AC q14d / PACLitaxel q7d       CHIEF COMPLIANT: cycle 3 Taxol   INTERVAL HISTORY: Caitlin [name] is a 67 y.o. female is here because of recent of right breast cancer.  Currently on Taxol. She presents to the clinic for a follow-up. She reports that she is tolerating the taxol. She has not lost ant weight and says taste is coming back. She have noticed head trimmers. She denies skin dryness and no numbness at all.   ALLERGIES:  is allergic to penicillins.  MEDICATIONS:  Current Outpatient Medications  Medication Sig Dispense Refill   acetaminophen (TYLENOL) 500 MG tablet Take 500 mg by mouth every 6 (six) hours as needed for moderate  pain.     albuterol (VENTOLIN HFA) 108 (90 Base) MCG/ACT inhaler Inhale 2 puffs into the lungs every 4 (four) hours as  needed for shortness of breath (Asthma).     aspirin 81 MG tablet Take 81 mg by mouth daily.       azithromycin (ZITHROMAX) 500 MG tablet Take 500 mg by mouth daily.     benzonatate (TESSALON) 200 MG capsule Take 200 mg by mouth daily as needed for cough.     dexamethasone (DECADRON) 4 MG tablet Take 2 tablets (8 mg total) by mouth daily for 3 days. Start the day after doxorubicin/cyclophosphamide chemotherapy. Take with food. (Patient not taking: Reported on 02/14/2023) 30 tablet 1   diltiazem (CARDIZEM CD) 120 MG 24 hr capsule Take 120 mg by mouth daily.     levothyroxine (SYNTHROID) 75 MCG tablet Take 75 mcg by mouth daily before breakfast.     lidocaine-prilocaine (EMLA) cream Apply to affected area once 30 g 3   Multiple Vitamins-Minerals (MULTIVITAMIN WITH MINERALS) tablet Take 1 tablet by mouth daily.     ondansetron (ZOFRAN) 8 MG tablet Take 1 tab (8 mg) by mouth every 8 hrs as needed for nausea/vomiting. Start third day after doxorubicin/cyclophosphamide chemotherapy. 30 tablet 1   OVER THE COUNTER MEDICATION Take 1 capsule by mouth 2 (two) times daily. copaiba softgels     prochlorperazine (COMPAZINE) 10 MG tablet TAKE 1 TABLET BY MOUTH EVERY 6 HOURS AS NEEDED FOR NAUSEA / FOR VOMITING 30 tablet 1   PULMICORT FLEXHALER 180 MCG/ACT inhaler Inhale 1 puff into the lungs daily.     traMADol (ULTRAM) 50 MG tablet Take 1 tablet (50 mg total) by mouth every 6 (six) hours as needed. 10 tablet 0   triamcinolone cream (KENALOG) 0.1 % Apply 1 Application topically daily as needed (Dry skin).     No current facility-administered medications for this visit.    PHYSICAL EXAMINATION: ECOG PERFORMANCE STATUS: 1 - Symptomatic but completely ambulatory  Vitals:   02/21/23 0808  BP: 118/68  Pulse: 74  Resp: 18  Temp: (!) 97.5 F (36.4 C)  SpO2: 98%   Filed Weights   02/21/23 0808  Weight: 147 lb (66.7 kg)     LABORATORY DATA:  I have reviewed the data as listed    Latest Ref Rng & Units  02/14/2023    7:47 AM 02/07/2023    7:48 AM 01/31/2023    8:11 AM  CMP  Glucose 70 - 99 mg/dL 97  409  811   BUN 8 - 23 mg/dL 17  13  11    Creatinine 0.44 - 1.00 mg/dL 9.14  7.82  9.56   Sodium 135 - 145 mmol/L 140  139  140   Potassium 3.5 - 5.1 mmol/L 3.9  3.9  3.8   Chloride 98 - 111 mmol/L 107  105  105   CO2 22 - 32 mmol/L 26  26  27    Calcium 8.9 - 10.3 mg/dL 9.0  9.0  9.4   Total Protein 6.5 - 8.1 g/dL 6.5  6.7  6.5   Total Bilirubin 0.3 - 1.2 mg/dL 0.3  0.2  0.2   Alkaline Phos 38 - 126 U/L 67  63  76   AST 15 - 41 U/L 13  12  20    ALT 0 - 44 U/L 11  13  24      Lab Results  Component Value Date   WBC 4.1 02/21/2023   HGB 10.6 (L) 02/21/2023  HCT 31.7 (L) 02/21/2023   MCV 94.6 02/21/2023   PLT 272 02/21/2023   NEUTROABS 2.9 02/21/2023    ASSESSMENT & PLAN:  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female, estrogen receptor positive (HCC) 10/14/2022:Right lumpectomy: 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  No distant metastatic disease. Bone scan 10/29/2022: No evidence of bone metastasis   11/08/2022 and 11/18/2022: Margin reexcision surgeries: Final margin focal positivity.  After much discussion the plan is to finish her systemic treatment and come back at a later point to do mastectomy.   Treatment plan: Adjuvant chemotherapy with dose dense Adriamycin and Cytoxan followed by Taxol  Adjuvant  radiation ------------------------------------------------------------------------------------------------------------------------------------------ Current treatment: Completed 4 cycles of dose dense Adriamycin and Cytoxan, today is cycle 3 Taxol Chemo toxicities: Alopecia Right lower lobe pneumonia: Held treatment on 01/31/2023 completed antibiotics.  Resume chemotherapy today. Weight loss: Stabilized since Taxol was started Fatigue   We are monitoring her blood counts closely.  Her neutrophil count will be watched and if necessary Neupogen will be considered. On 03/14/2023 we will hold off on treatment because her family is visiting her. Return to clinic weekly for Taxol treatments     No orders of the defined types were placed in this encounter.  The patient has a good understanding of the overall plan. she agrees with it. she will call with any problems that may develop before the next visit here. Total time spent: 30 mins including face to face time and time spent for planning, charting and co-ordination of care   Tamsen Meek, MD 02/21/23    I Janan Ridge am acting as a Neurosurgeon for [employer]  I have reviewed the above documentation for accuracy and completeness, and I agree with the above.

## 2023-02-17 ENCOUNTER — Ambulatory Visit: Payer: BC Managed Care – PPO | Attending: General Surgery

## 2023-02-17 DIAGNOSIS — Z483 Aftercare following surgery for neoplasm: Secondary | ICD-10-CM | POA: Insufficient documentation

## 2023-02-17 NOTE — Therapy (Signed)
OUTPATIENT PHYSICAL THERAPY SOZO SCREENING NOTE   Patient Name: Caitlin [name] MRN: [id number] DOB:[birth year], 67 y.o., female Today's Date: 02/17/2023  PCP: [name], MD REFERRING PROVIDER: [name], MD   PT End of Session - 02/17/23 1232     Visit Number 4    PT Start Time 1150    PT Stop Time 1156    PT Time Calculation (min) 6 min    Activity Tolerance Patient tolerated treatment well    Behavior During Therapy [facility] for tasks assessed/performed             Past Medical History:  Diagnosis Date   Asthma    Dyspnea    History of kidney stones    Hypothyroidism    rt breast ca 08/2022   Right Breast   Scoliosis    Thyroid disease    Past Surgical History:  Procedure Laterality Date   APPENDECTOMY     AXILLARY SENTINEL NODE BIOPSY Right 10/14/2022   Procedure: RIGHT AXILLARY SENTINEL NODE BIOPSY;  Surgeon: [name], MD;  Location: [facility] OR;  Service: General;  Laterality: Right;   BREAST BIOPSY Right 09/15/2022   [place] RT BREAST BX W LOC DEV 1ST LESION IMG BX SPEC US GUIDE 09/15/2022 [facility] MAMMOGRAPHY   BREAST BIOPSY  10/12/2022   [place] RT RADIOACTIVE SEED LOC 10/12/2022 [facility] MAMMOGRAPHY   BREAST LUMPECTOMY WITH RADIOACTIVE SEED AND SENTINEL LYMPH NODE BIOPSY Right 10/14/2022   Procedure: RIGHT BREAST LUMPECTOMY WITH RADIOACTIVE SEED;  Surgeon: [name], MD;  Location: [facility] OR;  Service: General;  Laterality: Right;   COLONOSCOPY     LAPAROSCOPIC APPENDECTOMY     PORTACATH PLACEMENT Left 10/14/2022   Procedure: INSERTION PORT-A-CATH;  Surgeon: [name], MD;  Location: [facility] OR;  Service: General;  Laterality: Left;   RE-EXCISION OF BREAST LUMPECTOMY Right 11/08/2022   Procedure: RE-EXCISION OF RIGHT BREAST LUMPECTOMY;  Surgeon: [name], MD;  Location:  SURGERY CENTER;  Service: General;  Laterality: Right;   RE-EXCISION OF BREAST LUMPECTOMY Right 11/18/2022   Procedure: RE-EXCISION OF RIGHT BREAST  LUMPECTOMY;  Surgeon: [name], MD;  Location: [facility] OR;  Service: General;  Laterality: Right;   WISDOM TOOTH EXTRACTION     Patient Active Problem List   Diagnosis Date Noted   Port-A-Cath in place 12/16/2022   Genetic testing 10/11/2022   Appendicitis with peritonitis 09/29/2022   Family history of prostate cancer 09/29/2022   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female, estrogen receptor positive (HCC) 09/22/2022   Atrial fibrillation with RVR (HCC) 11/30/2010   Chronic anticoagulation 11/30/2010   Hypothyroidism 11/30/2010   PALPITATIONS 11/27/2010    REFERRING DIAG: right breast cancer at risk for lymphedema  THERAPY DIAG:  Aftercare following surgery for neoplasm  PERTINENT HISTORY: Patient was diagnosed on 09/15/22 with right grade 3. It measures 1.4 x 0.9 x 1.2 cm and is located in the upper-outer quadrant. It is ER+, PR-, HER2- with a Ki67 of 30%. Plan is to undergo a R lumpectomy and SLNB on 10/14/22 followed by chemo and then radiation.  10/14/22: R breast lumpectomy 5/5, 11/08/22: Re excision of R lumpectomy, 11/18/22 - Re excision of R lumpectomy, will begin chemo and then possibly will need a mastectomy   PRECAUTIONS: right UE Lymphedema risk  SUBJECTIVE: Here for SOZO screen  PAIN:  Are you having pain? No  SOZO SCREENING: Patient was assessed today using the SOZO machine to determine the lymphedema index score. This was compared to  her baseline score. It was determined that she is within the recommended range when compared to her baseline and no further action is needed at this time. She will continue SOZO screenings. These are done every 3 months for 2 years post operatively followed by every 6 months for 2 years, and then annually.   L-DEX FLOWSHEETS - 02/17/23 1200       L-DEX LYMPHEDEMA SCREENING   Measurement Type Unilateral    L-DEX MEASUREMENT EXTREMITY Upper Extremity    POSITION  Standing    DOMINANT SIDE Right    At Risk  Side Right    BASELINE SCORE (UNILATERAL) -2.9    L-DEX SCORE (UNILATERAL) -1.6    VALUE CHANGE (UNILAT) 1.3             [name], [name] 02/17/23 12:34 PM

## 2023-02-18 MED FILL — Dexamethasone Sodium Phosphate Inj 100 MG/10ML: INTRAMUSCULAR | Qty: 1 | Status: AC

## 2023-02-21 ENCOUNTER — Inpatient Hospital Stay (HOSPITAL_BASED_OUTPATIENT_CLINIC_OR_DEPARTMENT_OTHER): Payer: BC Managed Care – PPO | Admitting: Hematology and Oncology

## 2023-02-21 ENCOUNTER — Ambulatory Visit: Payer: BC Managed Care – PPO

## 2023-02-21 ENCOUNTER — Inpatient Hospital Stay: Payer: BC Managed Care – PPO

## 2023-02-21 VITALS — BP 118/68 | HR 74 | Temp 97.5°F | Resp 18 | Ht 63.0 in | Wt 147.0 lb

## 2023-02-21 VITALS — BP 116/74 | HR 76 | Resp 14

## 2023-02-21 DIAGNOSIS — Z170 Estrogen receptor positive status [ER+]: Secondary | ICD-10-CM

## 2023-02-21 DIAGNOSIS — C50411 Malignant neoplasm of upper-outer quadrant of right female breast: Secondary | ICD-10-CM | POA: Diagnosis not present

## 2023-02-21 DIAGNOSIS — Z5111 Encounter for antineoplastic chemotherapy: Secondary | ICD-10-CM | POA: Diagnosis not present

## 2023-02-21 DIAGNOSIS — Z95828 Presence of other vascular implants and grafts: Secondary | ICD-10-CM

## 2023-02-21 LAB — CMP (CANCER CENTER ONLY)
ALT: 11 U/L (ref 0–44)
AST: 14 U/L — ABNORMAL LOW (ref 15–41)
Albumin: 4 g/dL (ref 3.5–5.0)
Alkaline Phosphatase: 64 U/L (ref 38–126)
Anion gap: 6 (ref 5–15)
BUN: 15 mg/dL (ref 8–23)
CO2: 25 mmol/L (ref 22–32)
Calcium: 8.9 mg/dL (ref 8.9–10.3)
Chloride: 107 mmol/L (ref 98–111)
Creatinine: 0.78 mg/dL (ref 0.44–1.00)
GFR, Estimated: >60 mL/min (ref >60)
Glucose, Bld: 97 mg/dL (ref 70–99)
Potassium: 3.9 mmol/L (ref 3.5–5.1)
Sodium: 138 mmol/L (ref 135–145)
Total Bilirubin: 0.4 mg/dL (ref 0.3–1.2)
Total Protein: 6.3 g/dL — ABNORMAL LOW (ref 6.5–8.1)

## 2023-02-21 LAB — CBC WITH DIFFERENTIAL (CANCER CENTER ONLY)
Abs Immature Granulocytes: 0.04 10*3/uL (ref 0.00–0.07)
Basophils Absolute: 0 10*3/uL (ref 0.0–0.1)
Basophils Relative: 1 %
Eosinophils Absolute: 0.2 10*3/uL (ref 0.0–0.5)
Eosinophils Relative: 6 %
HCT: 31.7 % — ABNORMAL LOW (ref 36.0–46.0)
Hemoglobin: 10.6 g/dL — ABNORMAL LOW (ref 12.0–15.0)
Immature Granulocytes: 1 %
Lymphocytes Relative: 13 %
Lymphs Abs: 0.5 10*3/uL — ABNORMAL LOW (ref 0.7–4.0)
MCH: 31.6 pg (ref 26.0–34.0)
MCHC: 33.4 g/dL (ref 30.0–36.0)
MCV: 94.6 fL (ref 80.0–100.0)
Monocytes Absolute: 0.4 10*3/uL (ref 0.1–1.0)
Monocytes Relative: 10 %
Neutro Abs: 2.9 10*3/uL (ref 1.7–7.7)
Neutrophils Relative %: 69 %
Platelet Count: 272 10*3/uL (ref 150–400)
RBC: 3.35 MIL/uL — ABNORMAL LOW (ref 3.87–5.11)
RDW: 17 % — ABNORMAL HIGH (ref 11.5–15.5)
WBC Count: 4.1 10*3/uL (ref 4.0–10.5)
nRBC: 0 % (ref 0.0–0.2)

## 2023-02-21 MED ORDER — SODIUM CHLORIDE 0.9 % IV SOLN
10.0000 mg | Freq: Once | INTRAVENOUS | Status: AC
  Administered 2023-02-21: 10 mg via INTRAVENOUS
  Filled 2023-02-21: qty 10

## 2023-02-21 MED ORDER — SODIUM CHLORIDE 0.9 % IV SOLN
70.0000 mg/m2 | Freq: Once | INTRAVENOUS | Status: AC
  Administered 2023-02-21: 126 mg via INTRAVENOUS
  Filled 2023-02-21: qty 21

## 2023-02-21 MED ORDER — SODIUM CHLORIDE 0.9 % IV SOLN: Freq: Once | INTRAVENOUS | Status: AC

## 2023-02-21 MED ORDER — FAMOTIDINE 20 MG IN NS 100 ML IVPB
20.0000 mg | Freq: Once | INTRAVENOUS | Status: AC
  Administered 2023-02-21: 20 mg via INTRAVENOUS
  Filled 2023-02-21: qty 100

## 2023-02-21 MED ORDER — SODIUM CHLORIDE 0.9% FLUSH
10.0000 mL | Freq: Once | INTRAVENOUS | Status: AC
  Administered 2023-02-21: 10 mL

## 2023-02-21 MED ORDER — SODIUM CHLORIDE 0.9% FLUSH
10.0000 mL | INTRAVENOUS | Status: DC | PRN
  Administered 2023-02-21: 10 mL

## 2023-02-21 MED ORDER — DIPHENHYDRAMINE HCL 50 MG/ML IJ SOLN
25.0000 mg | Freq: Once | INTRAMUSCULAR | Status: AC
  Administered 2023-02-21: 25 mg via INTRAVENOUS
  Filled 2023-02-21: qty 1

## 2023-02-21 MED ORDER — HEPARIN SOD (PORK) LOCK FLUSH 100 UNIT/ML IV SOLN
500.0000 [IU] | Freq: Once | INTRAVENOUS | Status: AC | PRN
  Administered 2023-02-21: 500 [IU]

## 2023-02-21 NOTE — Patient Instructions (Signed)
[facility]  Discharge Instructions: Thank you for choosing [facility] to provide your oncology and hematology care.   If you have a lab appointment with the Cancer Center, please go directly to the Cancer Center and check in at the registration area.   Wear comfortable clothing and clothing appropriate for easy access to any Portacath or PICC line.   We strive to give you quality time with your provider. You may need to reschedule your appointment if you arrive late (15 or more minutes).  Arriving late affects you and other patients whose appointments are after yours.  Also, if you miss three or more appointments without notifying the office, you may be dismissed from the clinic at the provider's discretion.      For prescription refill requests, have your pharmacy contact our office and allow 72 hours for refills to be completed.    Today you received the following chemotherapy and/or immunotherapy agents Taxol      To help prevent nausea and vomiting after your treatment, we encourage you to take your nausea medication as directed.  BELOW ARE SYMPTOMS THAT SHOULD BE REPORTED IMMEDIATELY: *FEVER GREATER THAN 100.4 F (38 C) OR HIGHER *CHILLS OR SWEATING *NAUSEA AND VOMITING THAT IS NOT CONTROLLED WITH YOUR NAUSEA MEDICATION *UNUSUAL SHORTNESS OF BREATH *UNUSUAL BRUISING OR BLEEDING *URINARY PROBLEMS (pain or burning when urinating, or frequent urination) *BOWEL PROBLEMS (unusual diarrhea, constipation, pain near the anus) TENDERNESS IN MOUTH AND THROAT WITH OR WITHOUT PRESENCE OF ULCERS (sore throat, sores in mouth, or a toothache) UNUSUAL RASH, SWELLING OR PAIN  UNUSUAL VAGINAL DISCHARGE OR ITCHING   Items with * indicate a potential emergency and should be followed up as soon as possible or go to the Emergency Department if any problems should occur.  Please show the CHEMOTHERAPY ALERT CARD or IMMUNOTHERAPY ALERT CARD at check-in  to the Emergency Department and triage nurse.  Should you have questions after your visit or need to cancel or reschedule your appointment, please contact [facility]  Dept: [phone]  and follow the prompts.  Office hours are 8:00 a.m. to 4:30 p.m. Monday - Friday. Please note that voicemails left after 4:00 p.m. may not be returned until the following business day.  We are closed weekends and major holidays. You have access to a nurse at all times for urgent questions. Please call the main number to the clinic Dept: [phone] and follow the prompts.   For any non-urgent questions, you may also contact your provider using MyChart. We now offer e-Visits for anyone 18 and older to request care online for non-urgent symptoms. For details visit [email or web address].   Also download the MyChart app! Go to the app store, search "MyChart", open the app, select , and log in with your MyChart username and password.   

## 2023-02-21 NOTE — Assessment & Plan Note (Addendum)
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  No distant metastatic disease. Bone scan 10/29/2022: No evidence of bone metastasis   11/08/2022 and 11/18/2022: Margin reexcision surgeries: Final margin focal positivity.  After much discussion the plan is to finish her systemic treatment and come back at a later point to do mastectomy.   Treatment plan: Adjuvant chemotherapy with dose dense Adriamycin and Cytoxan followed by Taxol  Adjuvant radiation ------------------------------------------------------------------------------------------------------------------------------------------ Current treatment: Completed 4 cycles of dose dense Adriamycin and Cytoxan, today is cycle 3 Taxol Chemo toxicities: Nausea: Grade 1 Alopecia Right lower lobe pneumonia: Held treatment on 01/31/2023 completed antibiotics.  Weight loss: Stabilized since Taxol was started Fatigue   We are monitoring her blood counts closely.  Her neutrophil count will be watched and if necessary Neupogen will be considered.  Return to clinic weekly for Taxol treatments

## 2023-02-22 ENCOUNTER — Other Ambulatory Visit: Payer: Self-pay

## 2023-02-22 ENCOUNTER — Encounter: Payer: Self-pay | Admitting: Hematology and Oncology

## 2023-02-25 ENCOUNTER — Telehealth: Payer: Self-pay

## 2023-02-25 ENCOUNTER — Encounter: Payer: Self-pay | Admitting: Hematology and Oncology

## 2023-02-25 ENCOUNTER — Other Ambulatory Visit: Payer: Self-pay

## 2023-02-25 MED ORDER — AZITHROMYCIN 250 MG PO TABS: ORAL_TABLET | ORAL | 0 refills | Status: DC

## 2023-02-25 MED FILL — Dexamethasone Sodium Phosphate Inj 100 MG/10ML: INTRAMUSCULAR | Qty: 1 | Status: AC

## 2023-02-25 NOTE — Telephone Encounter (Signed)
S/w pt via phone. Z-pak sent to preferred phx per MD.

## 2023-02-27 NOTE — Progress Notes (Signed)
Patient Care Team: [name], MD as PCP - General (Unknown Physician Specialty) Pershing Proud, RN as Oncology Nurse Navigator Donnelly Angelica, RN as Oncology Nurse Navigator [name], MD as Consulting Physician (Hematology and Oncology)  DIAGNOSIS:  Encounter Diagnosis  Name Primary?   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 Report date is 10/06/2021.  The CancerNext-Expanded gene panel offered by [facility] and includes sequencing, rearrangement, and RNA analysis for the following 77 genes: AIP, ALK, APC, ATM, AXIN2, BAP1, BARD1, BLM, BMPR1A, BRCA1, BRCA2, BRIP1, CDC73, CDH1, CDK4, CDKN1B, CDKN2A, CHEK2, CTNNA1, DICER1, FANCC, FH, FLCN, GALNT12, KIF1B, LZTR1, MAX, MEN1, MET, MLH1, MSH2, MSH3, MSH6, MUTYH, NBN, NF1, NF2, NTHL1, PALB2, PHOX2B, PMS2, POT1, PRKAR1A, PTCH1, PTEN, RAD51C, RAD51D, RB1, RECQL, RET, SDHA, SDHAF2, SDHB, SDHC, SDHD, SMAD4, SMARCA4, SMARCB1, SMARCE1, STK11, SUFU, TMEM127, TP53, TSC1, TSC2, VHL and XRCC2 (sequencing and deletion/duplication); EGFR, EGLN1, HOXB13, KIT, MITF, PDGFRA, POLD1, and POLE (sequencing only); EPCAM and GREM1 (deletion/duplication only).    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 with focal lobular features with high-grade DCIS, inferior margin positive, angiolymphatic invasion present, 5/5 lymph nodes positive, additional medial margin: Poorly differentiated carcinoma with lymphoid stroma, additional inferior margin: Positive, ER 10% weak, PR 0%, HER2 0, Ki-67 30%   10/21/2022 Cancer Staging   Staging form: Breast, AJCC 8th Edition - Pathologic: Stage IIIC (pT1c, pN2a, cM0, G3, ER-, PR-, HER2-) - Signed by [name], MD on 10/21/2022 Stage prefix: Initial diagnosis Histologic grading system: 3 grade system   11/08/2022 Surgery   Margin reexcision: Inferior margin: Grade 3 IDC, new margin positive Medial margin: Grade 3 IDC new margin widely involved   11/18/2022 Surgery   Right inferior margin reexcision: Invasive poorly differentiated adenocarcinoma grade 3 present in new inferior margin.  Medial margin reexcision: Invasive poorly differentiated adenocarcinoma grade 3 present at new medial margin, posterior margin: Poorly differentiated adenocarcinoma grade 3, margins negative   12/02/2022 -  Chemotherapy   Patient is on Treatment Plan : BREAST ADJUVANT DOSE DENSE AC q14d / PACLitaxel q7d       CHIEF COMPLIANT:  cycle 5 Taxol   INTERVAL HISTORY: Caitlin [name] is a  67 y.o. [name] is here because of recent of right breast cancer.  Currently on Taxol. She presents to the clinic for a follow-up. She reports that she is doing well. She still feel tired. She says she does walk some. Denies any tingling in fingers or toes.  She is excited about the next week time off from chemo so that she can enjoy time with her family.   ALLERGIES:  is allergic to penicillins.  MEDICATIONS:  Current Outpatient Medications  Medication Sig Dispense Refill   acetaminophen (TYLENOL) 500 MG tablet  Take 500 mg by mouth every 6 (six) hours as needed for moderate pain.     albuterol (VENTOLIN HFA) 108 (90 Base) MCG/ACT inhaler Inhale 2  puffs into the lungs every 4 (four) hours as needed for shortness of breath (Asthma).     aspirin 81 MG tablet Take 81 mg by mouth daily.       azithromycin (ZITHROMAX Z-PAK) 250 MG tablet Take as directed 6 each 0   benzonatate (TESSALON) 200 MG capsule Take 200 mg by mouth daily as needed for cough.     dexamethasone (DECADRON) 4 MG tablet Take 2 tablets (8 mg total) by mouth daily for 3 days. Start the day after doxorubicin/cyclophosphamide chemotherapy. Take with food. 30 tablet 1   diltiazem (CARDIZEM CD) 120 MG 24 hr capsule Take 120 mg by mouth daily.     levothyroxine (SYNTHROID) 75 MCG tablet Take 75 mcg by mouth daily before breakfast.     lidocaine-prilocaine (EMLA) cream Apply to affected area once 30 g 3   Multiple Vitamins-Minerals (MULTIVITAMIN WITH MINERALS) tablet Take 1 tablet by mouth daily.     ondansetron (ZOFRAN) 8 MG tablet Take 1 tab (8 mg) by mouth every 8 hrs as needed for nausea/vomiting. Start third day after doxorubicin/cyclophosphamide chemotherapy. 30 tablet 1   OVER THE COUNTER MEDICATION Take 1 capsule by mouth 2 (two) times daily. copaiba softgels     prochlorperazine (COMPAZINE) 10 MG tablet TAKE 1 TABLET BY MOUTH EVERY 6 HOURS AS NEEDED FOR NAUSEA / FOR VOMITING 30 tablet 1   PULMICORT FLEXHALER 180 MCG/ACT inhaler Inhale 1 puff into the lungs daily.     traMADol (ULTRAM) 50 MG tablet Take 1 tablet (50 mg total) by mouth every 6 (six) hours as needed. 10 tablet 0   triamcinolone cream (KENALOG) 0.1 % Apply 1 Application topically daily as needed (Dry skin).     No current facility-administered medications for this visit.    PHYSICAL EXAMINATION: ECOG PERFORMANCE STATUS: 1 - Symptomatic but completely ambulatory  Vitals:   03/07/23 0840  BP: 130/77  Pulse: 85  Resp: 18  Temp: 97.8 F (36.6 C)  SpO2: 98%   Filed Weights   03/07/23 0840  Weight: 147 lb 12.8 oz (67 kg)      LABORATORY DATA:  I have reviewed the data as listed    Latest Ref Rng &  Units 03/01/2023    7:39 AM 02/21/2023    7:35 AM 02/14/2023    7:47 AM  CMP  Glucose 70 - 99 mg/dL 161  97  97   BUN 8 - 23 mg/dL 15  15  17    Creatinine 0.44 - 1.00 mg/dL 0.96  0.45  4.09   Sodium 135 - 145 mmol/L 139  138  140   Potassium 3.5 - 5.1 mmol/L 3.8  3.9  3.9   Chloride 98 - 111 mmol/L 107  107  107   CO2 22 - 32 mmol/L 25  25  26    Calcium 8.9 - 10.3 mg/dL 8.7  8.9  9.0   Total Protein 6.5 - 8.1 g/dL 6.2  6.3  6.5   Total Bilirubin 0.3 - 1.2 mg/dL 0.4  0.4  0.3   Alkaline Phos 38 - 126 U/L 56  64  67   AST 15 - 41 U/L 20  14  13    ALT 0 - 44 U/L 19  11  11      Lab Results  Component Value Date   WBC  3.2 (L) 03/07/2023   HGB 10.7 (L) 03/07/2023   HCT 32.0 (L) 03/07/2023   MCV 96.7 03/07/2023   PLT 264 03/07/2023   NEUTROABS 2.1 03/07/2023    ASSESSMENT & PLAN:  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[name], estrogen receptor positive (HCC) 10/14/2022:Right lumpectomy: 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  No distant metastatic disease. Bone scan 10/29/2022: No evidence of bone metastasis   11/08/2022 and 11/18/2022: Margin reexcision surgeries: Final margin focal positivity.  After much discussion the plan is to finish her systemic treatment and come back at a later point to do mastectomy.   Treatment plan: Adjuvant chemotherapy with dose dense Adriamycin and Cytoxan followed by Taxol  Adjuvant  radiation ------------------------------------------------------------------------------------------------------------------------------------------ Current treatment: Completed 4 cycles of dose dense Adriamycin and Cytoxan, today is cycle 5 Taxol Chemo toxicities: Alopecia Right lower lobe pneumonia: Held treatment on 01/31/2023 completed antibiotics.  Weight loss: Stabilized since Taxol was started Fatigue Leukopenia: Monitoring very closely today's ANC is 2.1. Chemo-induced anemia: Hemoglobin is 10.7 and has improved slightly.   We are monitoring her blood counts closely.  Her neutrophil count will be watched and if necessary Neupogen will be considered. On 03/14/2023 we will hold off on treatment because her family is visiting her. Return to clinic after that for weekly Taxol treatments     No orders of the defined types were placed in this encounter.  The patient has a good understanding of the overall plan. she agrees with it. she will call with any problems that may develop before the next visit here. Total time spent: 30 mins including face to face time and time spent for planning, charting and co-ordination of care   Tamsen Meek, MD 03/07/23    I Janan Ridge am acting as a Neurosurgeon for [employer]  I have reviewed the above documentation for accuracy and completeness, and I agree with the above.

## 2023-03-01 ENCOUNTER — Inpatient Hospital Stay: Payer: BC Managed Care – PPO

## 2023-03-01 VITALS — BP 117/77 | HR 68 | Temp 98.7°F | Resp 18 | Ht 63.0 in | Wt 148.5 lb

## 2023-03-01 DIAGNOSIS — Z170 Estrogen receptor positive status [ER+]: Secondary | ICD-10-CM

## 2023-03-01 DIAGNOSIS — Z95828 Presence of other vascular implants and grafts: Secondary | ICD-10-CM

## 2023-03-01 DIAGNOSIS — Z5111 Encounter for antineoplastic chemotherapy: Secondary | ICD-10-CM | POA: Diagnosis not present

## 2023-03-01 LAB — CBC WITH DIFFERENTIAL (CANCER CENTER ONLY)
Abs Immature Granulocytes: 0.03 10*3/uL (ref 0.00–0.07)
Basophils Absolute: 0 10*3/uL (ref 0.0–0.1)
Basophils Relative: 1 %
Eosinophils Absolute: 0.2 10*3/uL (ref 0.0–0.5)
Eosinophils Relative: 5 %
HCT: 31.3 % — ABNORMAL LOW (ref 36.0–46.0)
Hemoglobin: 10.4 g/dL — ABNORMAL LOW (ref 12.0–15.0)
Immature Granulocytes: 1 %
Lymphocytes Relative: 20 %
Lymphs Abs: 0.7 10*3/uL (ref 0.7–4.0)
MCH: 32 pg (ref 26.0–34.0)
MCHC: 33.2 g/dL (ref 30.0–36.0)
MCV: 96.3 fL (ref 80.0–100.0)
Monocytes Absolute: 0.5 10*3/uL (ref 0.1–1.0)
Monocytes Relative: 13 %
Neutro Abs: 2.3 10*3/uL (ref 1.7–7.7)
Neutrophils Relative %: 60 %
Platelet Count: 267 10*3/uL (ref 150–400)
RBC: 3.25 MIL/uL — ABNORMAL LOW (ref 3.87–5.11)
RDW: 16.7 % — ABNORMAL HIGH (ref 11.5–15.5)
WBC Count: 3.8 10*3/uL — ABNORMAL LOW (ref 4.0–10.5)
nRBC: 0 % (ref 0.0–0.2)

## 2023-03-01 LAB — CMP (CANCER CENTER ONLY)
ALT: 19 U/L (ref 0–44)
AST: 20 U/L (ref 15–41)
Albumin: 4 g/dL (ref 3.5–5.0)
Alkaline Phosphatase: 56 U/L (ref 38–126)
Anion gap: 7 (ref 5–15)
BUN: 15 mg/dL (ref 8–23)
CO2: 25 mmol/L (ref 22–32)
Calcium: 8.7 mg/dL — ABNORMAL LOW (ref 8.9–10.3)
Chloride: 107 mmol/L (ref 98–111)
Creatinine: 0.68 mg/dL (ref 0.44–1.00)
GFR, Estimated: >60 mL/min (ref >60)
Glucose, Bld: 101 mg/dL — ABNORMAL HIGH (ref 70–99)
Potassium: 3.8 mmol/L (ref 3.5–5.1)
Sodium: 139 mmol/L (ref 135–145)
Total Bilirubin: 0.4 mg/dL (ref 0.3–1.2)
Total Protein: 6.2 g/dL — ABNORMAL LOW (ref 6.5–8.1)

## 2023-03-01 MED ORDER — SODIUM CHLORIDE 0.9 % IV SOLN: Freq: Once | INTRAVENOUS | Status: AC

## 2023-03-01 MED ORDER — SODIUM CHLORIDE 0.9 % IV SOLN
10.0000 mg | Freq: Once | INTRAVENOUS | Status: AC
  Administered 2023-03-01: 10 mg via INTRAVENOUS
  Filled 2023-03-01: qty 10

## 2023-03-01 MED ORDER — DIPHENHYDRAMINE HCL 50 MG/ML IJ SOLN
25.0000 mg | Freq: Once | INTRAMUSCULAR | Status: AC
  Administered 2023-03-01: 25 mg via INTRAVENOUS
  Filled 2023-03-01: qty 1

## 2023-03-01 MED ORDER — SODIUM CHLORIDE 0.9% FLUSH
10.0000 mL | INTRAVENOUS | Status: DC | PRN
  Administered 2023-03-01: 10 mL

## 2023-03-01 MED ORDER — SODIUM CHLORIDE 0.9% FLUSH
10.0000 mL | Freq: Once | INTRAVENOUS | Status: AC
  Administered 2023-03-01: 10 mL

## 2023-03-01 MED ORDER — HEPARIN SOD (PORK) LOCK FLUSH 100 UNIT/ML IV SOLN
500.0000 [IU] | Freq: Once | INTRAVENOUS | Status: AC | PRN
  Administered 2023-03-01: 500 [IU]

## 2023-03-01 MED ORDER — SODIUM CHLORIDE 0.9 % IV SOLN
70.0000 mg/m2 | Freq: Once | INTRAVENOUS | Status: AC
  Administered 2023-03-01: 126 mg via INTRAVENOUS
  Filled 2023-03-01: qty 21

## 2023-03-01 MED ORDER — FAMOTIDINE IN NACL 20-0.9 MG/50ML-% IV SOLN
20.0000 mg | Freq: Once | INTRAVENOUS | Status: AC
  Administered 2023-03-01: 20 mg via INTRAVENOUS
  Filled 2023-03-01: qty 50

## 2023-03-01 NOTE — Patient Instructions (Signed)
[place] [facility]  Discharge Instructions: Thank you for choosing [facility] to provide your oncology and hematology care.   If you have a lab appointment with the Cancer Center, please go directly to the Cancer Center and check in at the registration area.   Wear comfortable clothing and clothing appropriate for easy access to any Portacath or PICC line.   We strive to give you quality time with your provider. You may need to reschedule your appointment if you arrive late (15 or more minutes).  Arriving late affects you and other patients whose appointments are after yours.  Also, if you miss three or more appointments without notifying the office, you may be dismissed from the clinic at the provider's discretion.      For prescription refill requests, have your pharmacy contact our office and allow 72 hours for refills to be completed.    Today you received the following chemotherapy and/or immunotherapy agents Taxol      To help prevent nausea and vomiting after your treatment, we encourage you to take your nausea medication as directed.  BELOW ARE SYMPTOMS THAT SHOULD BE REPORTED IMMEDIATELY: *FEVER GREATER THAN 100.4 F (38 C) OR HIGHER *CHILLS OR SWEATING *NAUSEA AND VOMITING THAT IS NOT CONTROLLED WITH YOUR NAUSEA MEDICATION *UNUSUAL SHORTNESS OF BREATH *UNUSUAL BRUISING OR BLEEDING *URINARY PROBLEMS (pain or burning when urinating, or frequent urination) *BOWEL PROBLEMS (unusual diarrhea, constipation, pain near the anus) TENDERNESS IN MOUTH AND THROAT WITH OR WITHOUT PRESENCE OF ULCERS (sore throat, sores in mouth, or a toothache) UNUSUAL RASH, SWELLING OR PAIN  UNUSUAL VAGINAL DISCHARGE OR ITCHING   Items with * indicate a potential emergency and should be followed up as soon as possible or go to the Emergency Department if any problems should occur.  Please show the CHEMOTHERAPY ALERT CARD or IMMUNOTHERAPY ALERT CARD at check-in  to the Emergency Department and triage nurse.  Should you have questions after your visit or need to cancel or reschedule your appointment, please contact [place] [facility]  Dept: [phone]  and follow the prompts.  Office hours are 8:00 a.m. to 4:30 p.m. Monday - Friday. Please note that voicemails left after 4:00 p.m. may not be returned until the following business day.  We are closed weekends and major holidays. You have access to a nurse at all times for urgent questions. Please call the main number to the clinic Dept: [phone] and follow the prompts.   For any non-urgent questions, you may also contact your provider using MyChart. We now offer e-Visits for anyone 18 and older to request care online for non-urgent symptoms. For details visit [email or web address].   Also download the MyChart app! Go to the app store, search "MyChart", open the app, select [place], and log in with your MyChart username and password.   

## 2023-03-04 MED FILL — Dexamethasone Sodium Phosphate Inj 100 MG/10ML: INTRAMUSCULAR | Qty: 1 | Status: AC

## 2023-03-07 ENCOUNTER — Inpatient Hospital Stay: Payer: BC Managed Care – PPO

## 2023-03-07 ENCOUNTER — Inpatient Hospital Stay (HOSPITAL_BASED_OUTPATIENT_CLINIC_OR_DEPARTMENT_OTHER): Payer: BC Managed Care – PPO | Admitting: Hematology and Oncology

## 2023-03-07 ENCOUNTER — Inpatient Hospital Stay: Payer: BC Managed Care – PPO | Attending: Hematology and Oncology

## 2023-03-07 VITALS — BP 130/77 | HR 85 | Temp 97.8°F | Resp 18 | Ht 63.0 in | Wt 147.8 lb

## 2023-03-07 VITALS — BP 116/71 | HR 90 | Temp 98.7°F | Resp 16

## 2023-03-07 DIAGNOSIS — Z170 Estrogen receptor positive status [ER+]: Secondary | ICD-10-CM

## 2023-03-07 DIAGNOSIS — C50411 Malignant neoplasm of upper-outer quadrant of right female breast: Secondary | ICD-10-CM

## 2023-03-07 DIAGNOSIS — Z5111 Encounter for antineoplastic chemotherapy: Secondary | ICD-10-CM | POA: Insufficient documentation

## 2023-03-07 DIAGNOSIS — Z79899 Other long term (current) drug therapy: Secondary | ICD-10-CM | POA: Insufficient documentation

## 2023-03-07 DIAGNOSIS — Z95828 Presence of other vascular implants and grafts: Secondary | ICD-10-CM

## 2023-03-07 LAB — CBC WITH DIFFERENTIAL (CANCER CENTER ONLY)
Abs Immature Granulocytes: 0.02 10*3/uL (ref 0.00–0.07)
Basophils Absolute: 0 10*3/uL (ref 0.0–0.1)
Basophils Relative: 1 %
Eosinophils Absolute: 0.1 10*3/uL (ref 0.0–0.5)
Eosinophils Relative: 4 %
HCT: 32 % — ABNORMAL LOW (ref 36.0–46.0)
Hemoglobin: 10.7 g/dL — ABNORMAL LOW (ref 12.0–15.0)
Immature Granulocytes: 1 %
Lymphocytes Relative: 19 %
Lymphs Abs: 0.6 10*3/uL — ABNORMAL LOW (ref 0.7–4.0)
MCH: 32.3 pg (ref 26.0–34.0)
MCHC: 33.4 g/dL (ref 30.0–36.0)
MCV: 96.7 fL (ref 80.0–100.0)
Monocytes Absolute: 0.3 10*3/uL (ref 0.1–1.0)
Monocytes Relative: 10 %
Neutro Abs: 2.1 10*3/uL (ref 1.7–7.7)
Neutrophils Relative %: 65 %
Platelet Count: 264 10*3/uL (ref 150–400)
RBC: 3.31 MIL/uL — ABNORMAL LOW (ref 3.87–5.11)
RDW: 16.2 % — ABNORMAL HIGH (ref 11.5–15.5)
WBC Count: 3.2 10*3/uL — ABNORMAL LOW (ref 4.0–10.5)
nRBC: 0 % (ref 0.0–0.2)

## 2023-03-07 LAB — CMP (CANCER CENTER ONLY)
ALT: 17 U/L (ref 0–44)
AST: 17 U/L (ref 15–41)
Albumin: 4.1 g/dL (ref 3.5–5.0)
Alkaline Phosphatase: 66 U/L (ref 38–126)
Anion gap: 6 (ref 5–15)
BUN: 16 mg/dL (ref 8–23)
CO2: 25 mmol/L (ref 22–32)
Calcium: 9.2 mg/dL (ref 8.9–10.3)
Chloride: 107 mmol/L (ref 98–111)
Creatinine: 0.73 mg/dL (ref 0.44–1.00)
GFR, Estimated: >60 mL/min (ref >60)
Glucose, Bld: 96 mg/dL (ref 70–99)
Potassium: 4.1 mmol/L (ref 3.5–5.1)
Sodium: 138 mmol/L (ref 135–145)
Total Bilirubin: 0.4 mg/dL (ref 0.3–1.2)
Total Protein: 6.5 g/dL (ref 6.5–8.1)

## 2023-03-07 MED ORDER — HEPARIN SOD (PORK) LOCK FLUSH 100 UNIT/ML IV SOLN
500.0000 [IU] | Freq: Once | INTRAVENOUS | Status: AC | PRN
  Administered 2023-03-07: 500 [IU]

## 2023-03-07 MED ORDER — DIPHENHYDRAMINE HCL 50 MG/ML IJ SOLN
25.0000 mg | Freq: Once | INTRAMUSCULAR | Status: AC
  Administered 2023-03-07: 25 mg via INTRAVENOUS
  Filled 2023-03-07: qty 1

## 2023-03-07 MED ORDER — SODIUM CHLORIDE 0.9 % IV SOLN
70.0000 mg/m2 | Freq: Once | INTRAVENOUS | Status: AC
  Administered 2023-03-07: 126 mg via INTRAVENOUS
  Filled 2023-03-07: qty 21

## 2023-03-07 MED ORDER — SODIUM CHLORIDE 0.9% FLUSH
10.0000 mL | Freq: Once | INTRAVENOUS | Status: AC
  Administered 2023-03-07: 10 mL

## 2023-03-07 MED ORDER — FAMOTIDINE IN NACL 20-0.9 MG/50ML-% IV SOLN
20.0000 mg | Freq: Once | INTRAVENOUS | Status: AC
  Administered 2023-03-07: 20 mg via INTRAVENOUS
  Filled 2023-03-07: qty 50

## 2023-03-07 MED ORDER — SODIUM CHLORIDE 0.9% FLUSH
10.0000 mL | INTRAVENOUS | Status: DC | PRN
  Administered 2023-03-07: 10 mL

## 2023-03-07 MED ORDER — SODIUM CHLORIDE 0.9 % IV SOLN: Freq: Once | INTRAVENOUS | Status: AC

## 2023-03-07 MED ORDER — SODIUM CHLORIDE 0.9 % IV SOLN
10.0000 mg | Freq: Once | INTRAVENOUS | Status: AC
  Administered 2023-03-07: 10 mg via INTRAVENOUS
  Filled 2023-03-07: qty 10

## 2023-03-07 NOTE — Patient Instructions (Signed)
[place] [facility]  Discharge Instructions: Thank you for choosing [facility] to provide your oncology and hematology care.   If you have a lab appointment with the Cancer Center, please go directly to the Cancer Center and check in at the registration area.   Wear comfortable clothing and clothing appropriate for easy access to any Portacath or PICC line.   We strive to give you quality time with your provider. You may need to reschedule your appointment if you arrive late (15 or more minutes).  Arriving late affects you and other patients whose appointments are after yours.  Also, if you miss three or more appointments without notifying the office, you may be dismissed from the clinic at the provider's discretion.      For prescription refill requests, have your pharmacy contact our office and allow 72 hours for refills to be completed.    Today you received the following chemotherapy and/or immunotherapy agent: Paclitaxel   To help prevent nausea and vomiting after your treatment, we encourage you to take your nausea medication as directed.  BELOW ARE SYMPTOMS THAT SHOULD BE REPORTED IMMEDIATELY: *FEVER GREATER THAN 100.4 F (38 C) OR HIGHER *CHILLS OR SWEATING *NAUSEA AND VOMITING THAT IS NOT CONTROLLED WITH YOUR NAUSEA MEDICATION *UNUSUAL SHORTNESS OF BREATH *UNUSUAL BRUISING OR BLEEDING *URINARY PROBLEMS (pain or burning when urinating, or frequent urination) *BOWEL PROBLEMS (unusual diarrhea, constipation, pain near the anus) TENDERNESS IN MOUTH AND THROAT WITH OR WITHOUT PRESENCE OF ULCERS (sore throat, sores in mouth, or a toothache) UNUSUAL RASH, SWELLING OR PAIN  UNUSUAL VAGINAL DISCHARGE OR ITCHING   Items with * indicate a potential emergency and should be followed up as soon as possible or go to the Emergency Department if any problems should occur.  Please show the CHEMOTHERAPY ALERT CARD or IMMUNOTHERAPY ALERT CARD at check-in  to the Emergency Department and triage nurse.  Should you have questions after your visit or need to cancel or reschedule your appointment, please contact [place] [facility]  Dept: [phone]  and follow the prompts.  Office hours are 8:00 a.m. to 4:30 p.m. Monday - Friday. Please note that voicemails left after 4:00 p.m. may not be returned until the following business day.  We are closed weekends and major holidays. You have access to a nurse at all times for urgent questions. Please call the main number to the clinic Dept: [phone] and follow the prompts.   For any non-urgent questions, you may also contact your provider using MyChart. We now offer e-Visits for anyone 78 and older to request care online for non-urgent symptoms. For details visit [email or web address].   Also download the MyChart app! Go to the app store, search "MyChart", open the app, select [place], and log in with your MyChart username and password.  Paclitaxel Injection What is this medication? PACLITAXEL (PAK li TAX el) treats some types of cancer. It works by slowing down the growth of cancer cells. This medicine may be used for other purposes; ask your health care provider or pharmacist if you have questions. COMMON BRAND NAME(S): Onxol, Taxol What should I tell my care team before I take this medication? They need to know if you have any of these conditions: Heart disease Liver disease Low white blood cell levels An unusual or allergic reaction to paclitaxel, other medications, foods, dyes, or preservatives If you or your partner are pregnant or trying to get pregnant Breast-feeding How  should I use this medication? This medication is injected into a vein. It is given by your care team in a hospital or clinic setting. Talk to your care team about the use of this medication in children. While it may be given to children for selected conditions, precautions do  apply. Overdosage: If you think you have taken too much of this medicine contact a poison control center or emergency room at once. NOTE: This medicine is only for you. Do not share this medicine with others. What if I miss a dose? Keep appointments for follow-up doses. It is important not to miss your dose. Call your care team if you are unable to keep an appointment. What may interact with this medication? Do not take this medication with any of the following: Live virus vaccines Other medications may affect the way this medication works. Talk with your care team about all of the medications you take. They may suggest changes to your treatment plan to lower the risk of side effects and to make sure your medications work as intended. This list may not describe all possible interactions. Give your health care provider a list of all the medicines, herbs, non-prescription drugs, or dietary supplements you use. Also tell them if you smoke, drink alcohol, or use illegal drugs. Some items may interact with your medicine. What should I watch for while using this medication? Your condition will be monitored carefully while you are receiving this medication. You may need blood work while taking this medication. This medication may make you feel generally unwell. This is not uncommon as chemotherapy can affect healthy cells as well as cancer cells. Report any side effects. Continue your course of treatment even though you feel ill unless your care team tells you to stop. This medication can cause serious allergic reactions. To reduce the risk, your care team may give you other medications to take before receiving this one. Be sure to follow the directions from your care team. This medication may increase your risk of getting an infection. Call your care team for advice if you get a fever, chills, sore throat, or other symptoms of a cold or flu. Do not treat yourself. Try to avoid being around people who are  sick. This medication may increase your risk to bruise or bleed. Call your care team if you notice any unusual bleeding. Be careful brushing or flossing your teeth or using a toothpick because you may get an infection or bleed more easily. If you have any dental work done, tell your dentist you are receiving this medication. Talk to your care team if you may be pregnant. Serious birth defects can occur if you take this medication during pregnancy. Talk to your care team before breastfeeding. Changes to your treatment plan may be needed. What side effects may I notice from receiving this medication? Side effects that you should report to your care team as soon as possible: Allergic reactions--skin rash, itching, hives, swelling of the face, lips, tongue, or throat Heart rhythm changes--fast or irregular heartbeat, dizziness, feeling faint or lightheaded, chest pain, trouble breathing Increase in blood pressure Infection--fever, chills, cough, sore throat, wounds that don't heal, pain or trouble when passing urine, general feeling of discomfort or being unwell Low blood pressure--dizziness, feeling faint or lightheaded, blurry vision Low red blood cell level--unusual weakness or fatigue, dizziness, headache, trouble breathing Painful swelling, warmth, or redness of the skin, blisters or sores at the infusion site Pain, tingling, or numbness in the hands or  feet Slow heartbeat--dizziness, feeling faint or lightheaded, confusion, trouble breathing, unusual weakness or fatigue Unusual bruising or bleeding Side effects that usually do not require medical attention (report to your care team if they continue or are bothersome): Diarrhea Hair loss Joint pain Loss of appetite Muscle pain Nausea Vomiting This list may not describe all possible side effects. Call your doctor for medical advice about side effects. You may report side effects to FDA at 1-800-FDA-1088. Where should I keep my  medication? This medication is given in a hospital or clinic. It will not be stored at home. NOTE: This sheet is a summary. It may not cover all possible information. If you have questions about this medicine, talk to your doctor, pharmacist, or health care provider.  2024 Elsevier/Gold Standard (2022-02-09 00:00:00)

## 2023-03-07 NOTE — Assessment & Plan Note (Signed)
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  No distant metastatic disease. Bone scan 10/29/2022: No evidence of bone metastasis   11/08/2022 and 11/18/2022: Margin reexcision surgeries: Final margin focal positivity.  After much discussion the plan is to finish her systemic treatment and come back at a later point to do mastectomy.   Treatment plan: Adjuvant chemotherapy with dose dense Adriamycin and Cytoxan followed by Taxol  Adjuvant radiation ------------------------------------------------------------------------------------------------------------------------------------------ Current treatment: Completed 4 cycles of dose dense Adriamycin and Cytoxan, today is cycle 5 Taxol Chemo toxicities: Alopecia Right lower lobe pneumonia: Held treatment on 01/31/2023 completed antibiotics.  Resume chemotherapy today. Weight loss: Stabilized since Taxol was started Fatigue   We are monitoring her blood counts closely.  Her neutrophil count will be watched and if necessary Neupogen will be considered. On 03/14/2023 we will hold off on treatment because her family is visiting her. Return to clinic weekly for Taxol treatments

## 2023-03-14 ENCOUNTER — Ambulatory Visit: Payer: BC Managed Care – PPO

## 2023-03-14 ENCOUNTER — Other Ambulatory Visit: Payer: BC Managed Care – PPO

## 2023-03-18 MED FILL — Dexamethasone Sodium Phosphate Inj 100 MG/10ML: INTRAMUSCULAR | Qty: 1 | Status: AC

## 2023-03-21 ENCOUNTER — Inpatient Hospital Stay: Payer: BC Managed Care – PPO

## 2023-03-21 ENCOUNTER — Encounter: Payer: Self-pay | Admitting: Adult Health

## 2023-03-21 ENCOUNTER — Inpatient Hospital Stay (HOSPITAL_BASED_OUTPATIENT_CLINIC_OR_DEPARTMENT_OTHER): Payer: BC Managed Care – PPO | Admitting: Adult Health

## 2023-03-21 VITALS — BP 134/78 | HR 82 | Temp 97.5°F | Resp 18 | Ht 63.0 in | Wt 148.1 lb

## 2023-03-21 DIAGNOSIS — Z95828 Presence of other vascular implants and grafts: Secondary | ICD-10-CM

## 2023-03-21 DIAGNOSIS — Z5111 Encounter for antineoplastic chemotherapy: Secondary | ICD-10-CM | POA: Diagnosis not present

## 2023-03-21 DIAGNOSIS — C50411 Malignant neoplasm of upper-outer quadrant of right female breast: Secondary | ICD-10-CM | POA: Diagnosis not present

## 2023-03-21 DIAGNOSIS — Z170 Estrogen receptor positive status [ER+]: Secondary | ICD-10-CM

## 2023-03-21 LAB — CMP (CANCER CENTER ONLY)
ALT: 16 U/L (ref 0–44)
AST: 17 U/L (ref 15–41)
Albumin: 4 g/dL (ref 3.5–5.0)
Alkaline Phosphatase: 87 U/L (ref 38–126)
Anion gap: 5 (ref 5–15)
BUN: 12 mg/dL (ref 8–23)
CO2: 27 mmol/L (ref 22–32)
Calcium: 9.4 mg/dL (ref 8.9–10.3)
Chloride: 108 mmol/L (ref 98–111)
Creatinine: 0.69 mg/dL (ref 0.44–1.00)
GFR, Estimated: >60 mL/min (ref >60)
Glucose, Bld: 96 mg/dL (ref 70–99)
Potassium: 3.8 mmol/L (ref 3.5–5.1)
Sodium: 140 mmol/L (ref 135–145)
Total Bilirubin: 0.4 mg/dL (ref 0.3–1.2)
Total Protein: 6.3 g/dL — ABNORMAL LOW (ref 6.5–8.1)

## 2023-03-21 LAB — CBC WITH DIFFERENTIAL (CANCER CENTER ONLY)
Abs Immature Granulocytes: 0.01 10*3/uL (ref 0.00–0.07)
Basophils Absolute: 0 10*3/uL (ref 0.0–0.1)
Basophils Relative: 1 %
Eosinophils Absolute: 0.2 10*3/uL (ref 0.0–0.5)
Eosinophils Relative: 5 %
HCT: 34.2 % — ABNORMAL LOW (ref 36.0–46.0)
Hemoglobin: 11.2 g/dL — ABNORMAL LOW (ref 12.0–15.0)
Immature Granulocytes: 0 %
Lymphocytes Relative: 23 %
Lymphs Abs: 0.9 10*3/uL (ref 0.7–4.0)
MCH: 32.4 pg (ref 26.0–34.0)
MCHC: 32.7 g/dL (ref 30.0–36.0)
MCV: 98.8 fL (ref 80.0–100.0)
Monocytes Absolute: 0.5 10*3/uL (ref 0.1–1.0)
Monocytes Relative: 12 %
Neutro Abs: 2.4 10*3/uL (ref 1.7–7.7)
Neutrophils Relative %: 59 %
Platelet Count: 292 10*3/uL (ref 150–400)
RBC: 3.46 MIL/uL — ABNORMAL LOW (ref 3.87–5.11)
RDW: 14.7 % (ref 11.5–15.5)
WBC Count: 4 10*3/uL (ref 4.0–10.5)
nRBC: 0 % (ref 0.0–0.2)

## 2023-03-21 MED ORDER — SODIUM CHLORIDE 0.9% FLUSH
10.0000 mL | INTRAVENOUS | Status: DC | PRN
  Administered 2023-03-21: 10 mL

## 2023-03-21 MED ORDER — DIPHENHYDRAMINE HCL 50 MG/ML IJ SOLN
25.0000 mg | Freq: Once | INTRAMUSCULAR | Status: AC
  Administered 2023-03-21: 25 mg via INTRAVENOUS
  Filled 2023-03-21: qty 1

## 2023-03-21 MED ORDER — HEPARIN SOD (PORK) LOCK FLUSH 100 UNIT/ML IV SOLN
500.0000 [IU] | Freq: Once | INTRAVENOUS | Status: AC | PRN
  Administered 2023-03-21: 500 [IU]

## 2023-03-21 MED ORDER — FAMOTIDINE IN NACL 20-0.9 MG/50ML-% IV SOLN
20.0000 mg | Freq: Once | INTRAVENOUS | Status: AC
  Administered 2023-03-21: 20 mg via INTRAVENOUS
  Filled 2023-03-21: qty 50

## 2023-03-21 MED ORDER — SODIUM CHLORIDE 0.9 % IV SOLN
10.0000 mg | Freq: Once | INTRAVENOUS | Status: AC
  Administered 2023-03-21: 10 mg via INTRAVENOUS
  Filled 2023-03-21: qty 10

## 2023-03-21 MED ORDER — SODIUM CHLORIDE 0.9 % IV SOLN: Freq: Once | INTRAVENOUS | Status: AC

## 2023-03-21 MED ORDER — SODIUM CHLORIDE 0.9 % IV SOLN
70.0000 mg/m2 | Freq: Once | INTRAVENOUS | Status: AC
  Administered 2023-03-21: 126 mg via INTRAVENOUS
  Filled 2023-03-21: qty 21

## 2023-03-21 MED ORDER — SODIUM CHLORIDE 0.9% FLUSH
10.0000 mL | Freq: Once | INTRAVENOUS | Status: AC
  Administered 2023-03-21: 10 mL

## 2023-03-21 NOTE — Progress Notes (Signed)
[facility] Cancer Follow up:    Caitlin Chimera, MD [address] [place] [address]   DIAGNOSIS:  Cancer Staging  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female, estrogen receptor positive (HCC) Staging form: Breast, AJCC 8th Edition - Clinical: Stage IB (cT1c, cN0, cM0, G3, ER+, PR-, HER2-) - Signed by [name], MD on 09/25/2022 Histologic grading system: 3 grade system - Pathologic: Stage IIIC (pT1c, pN2a, cM0, G3, ER-, PR-, HER2-) - Signed by [name], MD on 10/21/2022 Stage prefix: Initial diagnosis Histologic grading system: 3 grade system   SUMMARY OF ONCOLOGIC HISTORY: Oncology History  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female, estrogen receptor positive (HCC)  09/15/2022 Initial Diagnosis   Screening mammogram detected right breast mass which measured 1.4 cm by ultrasound at 10 o'clock position, biopsy revealed grade 3 IDC with lymphovascular invasion ER 10%, PR 0%, Ki-67 30%, HER2 0 negative   09/25/2022 Cancer Staging   Staging form: Breast, AJCC 8th Edition - Clinical: Stage IB (cT1c, cN0, cM0, G3, ER+, PR-, HER2-) - Signed by [name], MD on 09/25/2022 Histologic grading system: 3 grade system    Genetic Testing   [facility] CancerNext-Expanded Panel+RNA was Negative. Report date is 10/06/2021.  The CancerNext-Expanded gene panel offered by [facility] and includes sequencing, rearrangement, and RNA analysis for the following 77 genes: AIP, ALK, APC, ATM, AXIN2, BAP1, BARD1, BLM, BMPR1A, BRCA1, BRCA2, BRIP1, CDC73, CDH1, CDK4, CDKN1B, CDKN2A, CHEK2, CTNNA1, DICER1, FANCC, FH, FLCN, GALNT12, KIF1B, LZTR1, MAX, MEN1, MET, MLH1, MSH2, MSH3, MSH6, MUTYH, NBN, NF1, NF2, NTHL1, PALB2, PHOX2B, PMS2, POT1, PRKAR1A, PTCH1, PTEN, RAD51C, RAD51D, RB1, RECQL, RET, SDHA, SDHAF2, SDHB, SDHC, SDHD, SMAD4, SMARCA4, SMARCB1, SMARCE1, STK11, SUFU, TMEM127, TP53, TSC1, TSC2, VHL and XRCC2 (sequencing and  deletion/duplication); EGFR, EGLN1, HOXB13, KIT, MITF, PDGFRA, POLD1, and POLE (sequencing only); EPCAM and GREM1 (deletion/duplication only).    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 with high-grade DCIS, inferior margin positive, angiolymphatic invasion present, 5/5 lymph nodes positive, additional medial margin: Poorly differentiated carcinoma with lymphoid stroma, additional inferior margin: Positive, ER 10% weak, PR 0%, HER2 0, Ki-67 30%   10/21/2022 Cancer Staging   Staging form: Breast, AJCC 8th Edition - Pathologic: Stage IIIC (pT1c, pN2a, cM0, G3, ER-, PR-, HER2-) - Signed by [name], MD on 10/21/2022 Stage prefix: Initial diagnosis Histologic grading system: 3 grade system   11/08/2022 Surgery   Margin reexcision: Inferior margin: Grade 3 IDC, new margin positive Medial margin: Grade 3 IDC new margin widely involved   11/18/2022 Surgery   Right inferior margin reexcision: Invasive poorly differentiated adenocarcinoma grade 3 present in new inferior margin.  Medial margin reexcision: Invasive poorly differentiated adenocarcinoma grade 3 present at new medial margin, posterior margin: Poorly differentiated adenocarcinoma grade 3, margins negative   12/02/2022 -  Chemotherapy   Patient is on Treatment Plan : BREAST ADJUVANT DOSE DENSE AC q14d / PACLitaxel q7d       CURRENT THERAPY: Taxol  INTERVAL HISTORY: Caitlin [name] 67 y.o. female returns for follow-up prior to receiving her weekly Taxol.  She is tolerating treatment well.  She denies any peripheral neuropathy.  She tells me that she has mild fatigue but feels so much better than when she was receiving the Adriamycin and Cytoxan.   Patient Active Problem List   Diagnosis Date Noted   Port-A-Cath in place 12/16/2022  Genetic testing 10/11/2022   Appendicitis with peritonitis 09/29/2022   Family history of prostate cancer  09/29/2022   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female, estrogen receptor positive (HCC) 09/22/2022   Atrial fibrillation with RVR (HCC) 11/30/2010   Chronic anticoagulation 11/30/2010   Hypothyroidism 11/30/2010   PALPITATIONS 11/27/2010    is allergic to penicillins.  MEDICAL HISTORY: Past Medical History:  Diagnosis Date   Asthma    Dyspnea    History of kidney stones    Hypothyroidism    rt breast ca 08/2022   Right Breast   Scoliosis    Thyroid disease     SURGICAL HISTORY: Past Surgical History:  Procedure Laterality Date   APPENDECTOMY     AXILLARY SENTINEL NODE BIOPSY Right 10/14/2022   Procedure: RIGHT AXILLARY SENTINEL NODE BIOPSY;  Surgeon: [name], MD;  Location: [facility] OR;  Service: General;  Laterality: Right;   BREAST BIOPSY Right 09/15/2022   [place] RT BREAST BX W LOC DEV 1ST LESION IMG BX SPEC US GUIDE 09/15/2022 [facility]MO[facility]   BREAST BIOPSY  10/12/2022   [place] RT RADIOACTIVE SEED LOC 10/12/2022 [facility]AMMOGRAP[facility]   BREAST LUMPECTOMY WITH RADIOACTIVE SEED AND SENTINEL LYMPH NODE BIOPSY Right 10/14/2022   Procedure: RIGHT BREAST LUMPECTOMY WITH RADIOACTIVE SEED;  Surgeon: [name], MD;  Location: [facility];  Service: General;  Laterality: Right;   COLONOSCOPY     LAPAROSCOPIC APPENDECTOMY     PORTACATH PLACEMENT Left 10/14/2022   Procedure: INSERTION PORT-A-CATH;  Surgeon: [name], MD;  Location: [facility] OR;  Service: General;  Laterality: Left;   RE-EXCISION OF BREAST LUMPECTOMY Right 11/08/2022   Procedure: RE-EXCISION OF RIGHT BREAST LUMPECTOMY;  Surgeon: [name], MD;  Location: [facility];  Service: General;  Laterality: Right;   RE-EXCISION OF BREAST LUMPECTOMY Right 11/18/2022   Procedure: RE-EXCISION OF RIGHT BREAST LUMPECTOMY;  Surgeon: [name], MD;  Location: [facility] OR;  Service: General;  Laterality: Right;   WISDOM TOOTH EXTRACTION      SOCIAL HISTORY: Social History    Socioeconomic History   Marital status: Married    Spouse name: Not on file   Number of children: Not on file   Years of education: Not on file   Highest education level: Not on file  Occupational History   Not on file  Tobacco Use   Smoking status: Never   Smokeless tobacco: Not on file  Vaping Use   Vaping Use: Never used  Substance and Sexual Activity   Alcohol use: Not Currently   Drug use: Never   Sexual activity: Not Currently  Other Topics Concern   Not on file  Social History Narrative   Not on file   Social Determinants of Health   Financial Resource Strain: Not on file  Food Insecurity: Not on file  Transportation Needs: Not on file  Physical Activity: Not on file  Stress: Not on file  Social Connections: Not on file  Intimate Partner Violence: Not on file    FAMILY HISTORY: Family History  Problem Relation Age of Onset   Thyroid disease Mother    Prostate cancer Father 65       metastatic   Hypertension Father    Thyroid disease Sister        twin sister age 65   Asthma Sister    Prostate cancer Brother 30   Heart attack Maternal Grandmother        died age 66's   Heart attack  Maternal Grandfather        died age 41's   Clotting disorder Paternal Grandmother        cerebral hemorrage    Review of Systems  Constitutional:  Positive for fatigue. Negative for appetite change, chills, fever and unexpected weight change.  HENT:   Negative for hearing loss, lump/mass and trouble swallowing.   Eyes:  Negative for eye problems and icterus.  Respiratory:  Negative for chest tightness, cough and shortness of breath.   Cardiovascular:  Negative for chest pain, leg swelling and palpitations.  Gastrointestinal:  Negative for abdominal distention, abdominal pain, constipation, diarrhea, nausea and vomiting.  Endocrine: Negative for hot flashes.  Genitourinary:  Negative for difficulty urinating.   Musculoskeletal:  Negative for arthralgias.  Skin:   Negative for itching and rash.  Neurological:  Negative for dizziness, extremity weakness, headaches and numbness.  Hematological:  Negative for adenopathy. Does not bruise/bleed easily.  Psychiatric/Behavioral:  Negative for depression. The patient is not nervous/anxious.       PHYSICAL EXAMINATION    Vitals:   03/21/23 0824  BP: 134/78  Pulse: 82  Resp: 18  Temp: (!) 97.5 F (36.4 C)  SpO2: 99%    Physical Exam Constitutional:      General: She is not in acute distress.    Appearance: Normal appearance. She is not toxic-appearing.  HENT:     Head: Normocephalic and atraumatic.     Mouth/Throat:     Mouth: Mucous membranes are moist.     Pharynx: Oropharynx is clear. No oropharyngeal exudate or posterior oropharyngeal erythema.  Eyes:     General: No scleral icterus. Cardiovascular:     Rate and Rhythm: Normal rate and regular rhythm.     Pulses: Normal pulses.     Heart sounds: Normal heart sounds.  Pulmonary:     Effort: Pulmonary effort is normal.     Breath sounds: Normal breath sounds.  Abdominal:     General: Abdomen is flat. Bowel sounds are normal. There is no distension.     Palpations: Abdomen is soft.     Tenderness: There is no abdominal tenderness.  Musculoskeletal:        General: No swelling.     Cervical back: Neck supple.  Lymphadenopathy:     Cervical: No cervical adenopathy.  Skin:    General: Skin is warm and dry.     Findings: No rash.  Neurological:     General: No focal deficit present.     Mental Status: She is alert.  Psychiatric:        Mood and Affect: Mood normal.        Behavior: Behavior normal.     LABORATORY DATA:  CBC    Component Value Date/Time   WBC 4.0 03/21/2023 0805   WBC 11.1 (H) 10/13/2022 0930   RBC 3.46 (L) 03/21/2023 0805   HGB 11.2 (L) 03/21/2023 0805   HCT 34.2 (L) 03/21/2023 0805   PLT 292 03/21/2023 0805   MCV 98.8 03/21/2023 0805   MCH 32.4 03/21/2023 0805   MCHC 32.7 03/21/2023 0805   RDW 14.7  03/21/2023 0805   LYMPHSABS 0.9 03/21/2023 0805   MONOABS 0.5 03/21/2023 0805   EOSABS 0.2 03/21/2023 0805   BASOSABS 0.0 03/21/2023 0805    CMP     Component Value Date/Time   NA 140 03/21/2023 0805   K 3.8 03/21/2023 0805   CL 108 03/21/2023 0805   CO2 27 03/21/2023 0805   GLUCOSE  96 03/21/2023 0805   BUN 12 03/21/2023 0805   CREATININE 0.69 03/21/2023 0805   CALCIUM 9.4 03/21/2023 0805   PROT 6.3 (L) 03/21/2023 0805   ALBUMIN 4.0 03/21/2023 0805   AST 17 03/21/2023 0805   ALT 16 03/21/2023 0805   ALKPHOS 87 03/21/2023 0805   BILITOT 0.4 03/21/2023 0805   GFRNONAA >60 03/21/2023 0805       PENDING LABS:   RADIOGRAPHIC STUDIES:  No results found.   PATHOLOGY:     ASSESSMENT and THERAPY PLAN:   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female, estrogen receptor positive (HCC) Caitlin [name] is a 67 year old woman with stage IIIc triple negative breast cancer diagnosed in December 2023 status post right lumpectomy, reexcision, now continuing on adjuvant chemotherapy with Adriamycin and Cytoxan to be followed by weekly paclitaxel.  Treatment plan:  1.  Surgery  2.  Adjuvant chemotherapy  3.  Adjuvant radiation therapy  Chemo Toxicities:   Treatment related fatigue: Recommended energy conservation. Nausea-resolved Constipation-resolved Chemotherapy-induced anemia: Her hemoglobin today is improved at 11.2. Hypokalemia: Her potassium is normal today.  She continues to eat potassium rich food Dehydration: resolved.  She will return weekly for Taxol and in two weeks for labs, follow-up, Taxol.    All questions were answered. The patient knows to call the clinic with any problems, questions or concerns. We can certainly see the patient much sooner if necessary.  Total encounter time:20 minutes*in face-to-face visit time, chart review, lab review, care coordination, order entry, and documentation of the encounter time.    Lillard Anes, NP  03/21/23 8:53 AM Medical Oncology and Hematology [facility] [address] [place], [place] [address] Tel. [phone]    Fax. [phone]  *Total Encounter Time as defined by the Centers for Medicare and Medicaid Services includes, in addition to the face-to-face time of a patient visit (documented in the note above) non-face-to-face time: obtaining and reviewing outside history, ordering and reviewing medications, tests or procedures, care coordination (communications with other health care professionals or caregivers) and documentation in the medical record.

## 2023-03-21 NOTE — Patient Instructions (Signed)
[facility]  Discharge Instructions: Thank you for choosing [facility] to provide your oncology and hematology care.   If you have a lab appointment with the Cancer Center, please go directly to the Cancer Center and check in at the registration area.   Wear comfortable clothing and clothing appropriate for easy access to any Portacath or PICC line.   We strive to give you quality time with your provider. You may need to reschedule your appointment if you arrive late (15 or more minutes).  Arriving late affects you and other patients whose appointments are after yours.  Also, if you miss three or more appointments without notifying the office, you may be dismissed from the clinic at the provider's discretion.      For prescription refill requests, have your pharmacy contact our office and allow 72 hours for refills to be completed.    Today you received the following chemotherapy and/or immunotherapy agents Paclitaxel      To help prevent nausea and vomiting after your treatment, we encourage you to take your nausea medication as directed.  BELOW ARE SYMPTOMS THAT SHOULD BE REPORTED IMMEDIATELY: *FEVER GREATER THAN 100.4 F (38 C) OR HIGHER *CHILLS OR SWEATING *NAUSEA AND VOMITING THAT IS NOT CONTROLLED WITH YOUR NAUSEA MEDICATION *UNUSUAL SHORTNESS OF BREATH *UNUSUAL BRUISING OR BLEEDING *URINARY PROBLEMS (pain or burning when urinating, or frequent urination) *BOWEL PROBLEMS (unusual diarrhea, constipation, pain near the anus) TENDERNESS IN MOUTH AND THROAT WITH OR WITHOUT PRESENCE OF ULCERS (sore throat, sores in mouth, or a toothache) UNUSUAL RASH, SWELLING OR PAIN  UNUSUAL VAGINAL DISCHARGE OR ITCHING   Items with * indicate a potential emergency and should be followed up as soon as possible or go to the Emergency Department if any problems should occur.  Please show the CHEMOTHERAPY ALERT CARD or IMMUNOTHERAPY ALERT CARD at  check-in to the Emergency Department and triage nurse.  Should you have questions after your visit or need to cancel or reschedule your appointment, please contact [facility]  Dept: [phone]  and follow the prompts.  Office hours are 8:00 a.m. to 4:30 p.m. Monday - Friday. Please note that voicemails left after 4:00 p.m. may not be returned until the following business day.  We are closed weekends and major holidays. You have access to a nurse at all times for urgent questions. Please call the main number to the clinic Dept: [phone] and follow the prompts.   For any non-urgent questions, you may also contact your provider using MyChart. We now offer e-Visits for anyone 18 and older to request care online for non-urgent symptoms. For details visit [email or web address].   Also download the MyChart app! Go to the app store, search "MyChart", open the app, select [facility], and log in with your MyChart username and password.   

## 2023-03-21 NOTE — Assessment & Plan Note (Signed)
Caitlin [name] is a 67 year old woman with stage IIIc triple negative breast cancer diagnosed in December 2023 status post right lumpectomy, reexcision, now continuing on adjuvant chemotherapy with Adriamycin and Cytoxan to be followed by weekly paclitaxel.  Treatment plan:  1.  Surgery  2.  Adjuvant chemotherapy  3.  Adjuvant radiation therapy  Chemo Toxicities:   Treatment related fatigue: Recommended energy conservation. Nausea-resolved Constipation-resolved Chemotherapy-induced anemia: Her hemoglobin today is improved at 11.2. Hypokalemia: Her potassium is normal today.  She continues to eat potassium rich food Dehydration: resolved.  She will return weekly for Taxol and in two weeks for labs, follow-up, Taxol.

## 2023-03-25 MED FILL — Dexamethasone Sodium Phosphate Inj 100 MG/10ML: INTRAMUSCULAR | Qty: 1 | Status: AC

## 2023-03-28 ENCOUNTER — Inpatient Hospital Stay: Payer: BC Managed Care – PPO

## 2023-03-28 ENCOUNTER — Other Ambulatory Visit: Payer: Self-pay

## 2023-03-28 VITALS — BP 147/82 | HR 79 | Temp 98.0°F | Resp 18 | Wt 148.0 lb

## 2023-03-28 DIAGNOSIS — Z5111 Encounter for antineoplastic chemotherapy: Secondary | ICD-10-CM | POA: Diagnosis not present

## 2023-03-28 DIAGNOSIS — Z170 Estrogen receptor positive status [ER+]: Secondary | ICD-10-CM

## 2023-03-28 DIAGNOSIS — Z95828 Presence of other vascular implants and grafts: Secondary | ICD-10-CM

## 2023-03-28 LAB — CMP (CANCER CENTER ONLY)
ALT: 15 U/L (ref 0–44)
AST: 17 U/L (ref 15–41)
Albumin: 4 g/dL (ref 3.5–5.0)
Alkaline Phosphatase: 77 U/L (ref 38–126)
Anion gap: 6 (ref 5–15)
BUN: 15 mg/dL (ref 8–23)
CO2: 27 mmol/L (ref 22–32)
Calcium: 9.4 mg/dL (ref 8.9–10.3)
Chloride: 107 mmol/L (ref 98–111)
Creatinine: 0.75 mg/dL (ref 0.44–1.00)
GFR, Estimated: >60 mL/min (ref >60)
Glucose, Bld: 96 mg/dL (ref 70–99)
Potassium: 3.7 mmol/L (ref 3.5–5.1)
Sodium: 140 mmol/L (ref 135–145)
Total Bilirubin: 0.3 mg/dL (ref 0.3–1.2)
Total Protein: 6.3 g/dL — ABNORMAL LOW (ref 6.5–8.1)

## 2023-03-28 LAB — CBC WITH DIFFERENTIAL (CANCER CENTER ONLY)
Abs Immature Granulocytes: 0.03 10*3/uL (ref 0.00–0.07)
Basophils Absolute: 0 10*3/uL (ref 0.0–0.1)
Basophils Relative: 1 %
Eosinophils Absolute: 0.2 10*3/uL (ref 0.0–0.5)
Eosinophils Relative: 6 %
HCT: 34.3 % — ABNORMAL LOW (ref 36.0–46.0)
Hemoglobin: 11.3 g/dL — ABNORMAL LOW (ref 12.0–15.0)
Immature Granulocytes: 1 %
Lymphocytes Relative: 25 %
Lymphs Abs: 0.9 10*3/uL (ref 0.7–4.0)
MCH: 32.3 pg (ref 26.0–34.0)
MCHC: 32.9 g/dL (ref 30.0–36.0)
MCV: 98 fL (ref 80.0–100.0)
Monocytes Absolute: 0.5 10*3/uL (ref 0.1–1.0)
Monocytes Relative: 13 %
Neutro Abs: 2.1 10*3/uL (ref 1.7–7.7)
Neutrophils Relative %: 54 %
Platelet Count: 293 10*3/uL (ref 150–400)
RBC: 3.5 MIL/uL — ABNORMAL LOW (ref 3.87–5.11)
RDW: 14.2 % (ref 11.5–15.5)
Smear Review: NORMAL
WBC Count: 3.8 10*3/uL — ABNORMAL LOW (ref 4.0–10.5)
nRBC: 0 % (ref 0.0–0.2)

## 2023-03-28 MED ORDER — SODIUM CHLORIDE 0.9 % IV SOLN
70.0000 mg/m2 | Freq: Once | INTRAVENOUS | Status: AC
  Administered 2023-03-28: 126 mg via INTRAVENOUS
  Filled 2023-03-28: qty 21

## 2023-03-28 MED ORDER — SODIUM CHLORIDE 0.9% FLUSH
10.0000 mL | Freq: Once | INTRAVENOUS | Status: AC
  Administered 2023-03-28: 10 mL

## 2023-03-28 MED ORDER — SODIUM CHLORIDE 0.9 % IV SOLN
10.0000 mg | Freq: Once | INTRAVENOUS | Status: AC
  Administered 2023-03-28: 10 mg via INTRAVENOUS
  Filled 2023-03-28: qty 10

## 2023-03-28 MED ORDER — DIPHENHYDRAMINE HCL 50 MG/ML IJ SOLN
25.0000 mg | Freq: Once | INTRAMUSCULAR | Status: AC
  Administered 2023-03-28: 25 mg via INTRAVENOUS
  Filled 2023-03-28: qty 1

## 2023-03-28 MED ORDER — SODIUM CHLORIDE 0.9% FLUSH
10.0000 mL | INTRAVENOUS | Status: DC | PRN
  Administered 2023-03-28: 10 mL

## 2023-03-28 MED ORDER — SODIUM CHLORIDE 0.9 % IV SOLN: Freq: Once | INTRAVENOUS | Status: AC

## 2023-03-28 MED ORDER — FAMOTIDINE IN NACL 20-0.9 MG/50ML-% IV SOLN
20.0000 mg | Freq: Once | INTRAVENOUS | Status: AC
  Administered 2023-03-28: 20 mg via INTRAVENOUS
  Filled 2023-03-28: qty 50

## 2023-03-28 MED ORDER — HEPARIN SOD (PORK) LOCK FLUSH 100 UNIT/ML IV SOLN
500.0000 [IU] | Freq: Once | INTRAVENOUS | Status: AC | PRN
  Administered 2023-03-28: 500 [IU]

## 2023-03-28 NOTE — Patient Instructions (Signed)
[facility]  Discharge Instructions: Thank you for choosing [facility] to provide your oncology and hematology care.   If you have a lab appointment with the Cancer Center, please go directly to the Cancer Center and check in at the registration area.   Wear comfortable clothing and clothing appropriate for easy access to any Portacath or PICC line.   We strive to give you quality time with your provider. You may need to reschedule your appointment if you arrive late (15 or more minutes).  Arriving late affects you and other patients whose appointments are after yours.  Also, if you miss three or more appointments without notifying the office, you may be dismissed from the clinic at the provider's discretion.      For prescription refill requests, have your pharmacy contact our office and allow 72 hours for refills to be completed.    Today you received the following chemotherapy and/or immunotherapy agents: paclitaxel      To help prevent nausea and vomiting after your treatment, we encourage you to take your nausea medication as directed.  BELOW ARE SYMPTOMS THAT SHOULD BE REPORTED IMMEDIATELY: *FEVER GREATER THAN 100.4 F (38 C) OR HIGHER *CHILLS OR SWEATING *NAUSEA AND VOMITING THAT IS NOT CONTROLLED WITH YOUR NAUSEA MEDICATION *UNUSUAL SHORTNESS OF BREATH *UNUSUAL BRUISING OR BLEEDING *URINARY PROBLEMS (pain or burning when urinating, or frequent urination) *BOWEL PROBLEMS (unusual diarrhea, constipation, pain near the anus) TENDERNESS IN MOUTH AND THROAT WITH OR WITHOUT PRESENCE OF ULCERS (sore throat, sores in mouth, or a toothache) UNUSUAL RASH, SWELLING OR PAIN  UNUSUAL VAGINAL DISCHARGE OR ITCHING   Items with * indicate a potential emergency and should be followed up as soon as possible or go to the Emergency Department if any problems should occur.  Please show the CHEMOTHERAPY ALERT CARD or IMMUNOTHERAPY ALERT CARD at  check-in to the Emergency Department and triage nurse.  Should you have questions after your visit or need to cancel or reschedule your appointment, please contact [facility]  Dept: [phone]  and follow the prompts.  Office hours are 8:00 a.m. to 4:30 p.m. Monday - Friday. Please note that voicemails left after 4:00 p.m. may not be returned until the following business day.  We are closed weekends and major holidays. You have access to a nurse at all times for urgent questions. Please call the main number to the clinic Dept: [phone] and follow the prompts.   For any non-urgent questions, you may also contact your provider using MyChart. We now offer e-Visits for anyone 18 and older to request care online for non-urgent symptoms. For details visit [email or web address].   Also download the MyChart app! Go to the app store, search "MyChart", open the app, select , and log in with your MyChart username and password.   

## 2023-03-30 ENCOUNTER — Telehealth: Payer: Self-pay | Admitting: Hematology and Oncology

## 2023-03-30 NOTE — Telephone Encounter (Signed)
Scheduled appointments per WQ. Patient is aware of all made appointments. 

## 2023-03-30 NOTE — Telephone Encounter (Signed)
Scheduled appointments per WQ. Patient is aware. 

## 2023-03-31 ENCOUNTER — Other Ambulatory Visit: Payer: Self-pay

## 2023-04-01 MED FILL — Dexamethasone Sodium Phosphate Inj 100 MG/10ML: INTRAMUSCULAR | Qty: 1 | Status: AC

## 2023-04-04 ENCOUNTER — Encounter: Payer: Self-pay | Admitting: Adult Health

## 2023-04-04 ENCOUNTER — Encounter: Payer: Self-pay | Admitting: *Deleted

## 2023-04-04 ENCOUNTER — Inpatient Hospital Stay: Payer: BC Managed Care – PPO

## 2023-04-04 ENCOUNTER — Other Ambulatory Visit: Payer: Self-pay | Admitting: *Deleted

## 2023-04-04 ENCOUNTER — Inpatient Hospital Stay (HOSPITAL_BASED_OUTPATIENT_CLINIC_OR_DEPARTMENT_OTHER): Payer: BC Managed Care – PPO | Admitting: Adult Health

## 2023-04-04 ENCOUNTER — Inpatient Hospital Stay: Payer: BC Managed Care – PPO | Attending: Hematology and Oncology

## 2023-04-04 VITALS — BP 125/79 | HR 80 | Temp 98.1°F | Resp 18 | Ht 63.0 in | Wt 146.4 lb

## 2023-04-04 VITALS — BP 109/74 | HR 84 | Temp 98.1°F | Resp 16

## 2023-04-04 DIAGNOSIS — Z5111 Encounter for antineoplastic chemotherapy: Secondary | ICD-10-CM | POA: Diagnosis present

## 2023-04-04 DIAGNOSIS — C50411 Malignant neoplasm of upper-outer quadrant of right female breast: Secondary | ICD-10-CM

## 2023-04-04 DIAGNOSIS — Z170 Estrogen receptor positive status [ER+]: Secondary | ICD-10-CM

## 2023-04-04 DIAGNOSIS — Z79899 Other long term (current) drug therapy: Secondary | ICD-10-CM | POA: Insufficient documentation

## 2023-04-04 DIAGNOSIS — Z95828 Presence of other vascular implants and grafts: Secondary | ICD-10-CM

## 2023-04-04 LAB — CBC WITH DIFFERENTIAL (CANCER CENTER ONLY)
Abs Immature Granulocytes: 0.01 10*3/uL (ref 0.00–0.07)
Basophils Absolute: 0 10*3/uL (ref 0.0–0.1)
Basophils Relative: 1 %
Eosinophils Absolute: 0.1 10*3/uL (ref 0.0–0.5)
Eosinophils Relative: 4 %
HCT: 34 % — ABNORMAL LOW (ref 36.0–46.0)
Hemoglobin: 11.3 g/dL — ABNORMAL LOW (ref 12.0–15.0)
Immature Granulocytes: 0 %
Lymphocytes Relative: 24 %
Lymphs Abs: 0.8 10*3/uL (ref 0.7–4.0)
MCH: 32.5 pg (ref 26.0–34.0)
MCHC: 33.2 g/dL (ref 30.0–36.0)
MCV: 97.7 fL (ref 80.0–100.0)
Monocytes Absolute: 0.4 10*3/uL (ref 0.1–1.0)
Monocytes Relative: 12 %
Neutro Abs: 1.9 10*3/uL (ref 1.7–7.7)
Neutrophils Relative %: 59 %
Platelet Count: 282 10*3/uL (ref 150–400)
RBC: 3.48 MIL/uL — ABNORMAL LOW (ref 3.87–5.11)
RDW: 13.4 % (ref 11.5–15.5)
WBC Count: 3.2 10*3/uL — ABNORMAL LOW (ref 4.0–10.5)
nRBC: 0 % (ref 0.0–0.2)

## 2023-04-04 LAB — CMP (CANCER CENTER ONLY)
ALT: 12 U/L (ref 0–44)
AST: 15 U/L (ref 15–41)
Albumin: 3.9 g/dL (ref 3.5–5.0)
Alkaline Phosphatase: 78 U/L (ref 38–126)
Anion gap: 7 (ref 5–15)
BUN: 14 mg/dL (ref 8–23)
CO2: 26 mmol/L (ref 22–32)
Calcium: 8.8 mg/dL — ABNORMAL LOW (ref 8.9–10.3)
Chloride: 106 mmol/L (ref 98–111)
Creatinine: 0.76 mg/dL (ref 0.44–1.00)
GFR, Estimated: >60 mL/min (ref >60)
Glucose, Bld: 87 mg/dL (ref 70–99)
Potassium: 3.8 mmol/L (ref 3.5–5.1)
Sodium: 139 mmol/L (ref 135–145)
Total Bilirubin: 0.4 mg/dL (ref 0.3–1.2)
Total Protein: 6.7 g/dL (ref 6.5–8.1)

## 2023-04-04 MED ORDER — HEPARIN SOD (PORK) LOCK FLUSH 100 UNIT/ML IV SOLN
500.0000 [IU] | Freq: Once | INTRAVENOUS | Status: AC | PRN
  Administered 2023-04-04: 500 [IU]

## 2023-04-04 MED ORDER — DIPHENHYDRAMINE HCL 50 MG/ML IJ SOLN
25.0000 mg | Freq: Once | INTRAMUSCULAR | Status: AC
  Administered 2023-04-04: 25 mg via INTRAVENOUS
  Filled 2023-04-04: qty 1

## 2023-04-04 MED ORDER — SODIUM CHLORIDE 0.9% FLUSH
10.0000 mL | Freq: Once | INTRAVENOUS | Status: AC
  Administered 2023-04-04: 10 mL

## 2023-04-04 MED ORDER — FAMOTIDINE IN NACL 20-0.9 MG/50ML-% IV SOLN
20.0000 mg | Freq: Once | INTRAVENOUS | Status: AC
  Administered 2023-04-04: 20 mg via INTRAVENOUS
  Filled 2023-04-04: qty 50

## 2023-04-04 MED ORDER — SODIUM CHLORIDE 0.9% FLUSH
10.0000 mL | INTRAVENOUS | Status: DC | PRN
  Administered 2023-04-04: 10 mL

## 2023-04-04 MED ORDER — SODIUM CHLORIDE 0.9 % IV SOLN
70.0000 mg/m2 | Freq: Once | INTRAVENOUS | Status: AC
  Administered 2023-04-04: 126 mg via INTRAVENOUS
  Filled 2023-04-04: qty 21

## 2023-04-04 MED ORDER — SODIUM CHLORIDE 0.9 % IV SOLN
10.0000 mg | Freq: Once | INTRAVENOUS | Status: AC
  Administered 2023-04-04: 10 mg via INTRAVENOUS
  Filled 2023-04-04: qty 10

## 2023-04-04 MED ORDER — SODIUM CHLORIDE 0.9 % IV SOLN: Freq: Once | INTRAVENOUS | Status: AC

## 2023-04-04 NOTE — Assessment & Plan Note (Addendum)
Caitlin [name] is a 67 year old woman with stage IIIc triple negative breast cancer diagnosed in December 2023 status post right lumpectomy, reexcision, now continuing on adjuvant chemotherapy with Adriamycin and Cytoxan to be followed by weekly paclitaxel.  Treatment plan:  1.  Surgery  2.  Adjuvant chemotherapy  3.  Mastectomy  4.  Adjuvant radiation therapy  Chemo Toxicities:   Treatment related fatigue: she remains active, which I continue to encourage.  She is practicing energy conservation. Nausea-resolved Constipation-resolved Chemotherapy-induced anemia: Her hemoglobin today is improved at 11.3. Hypokalemia: potassium level pending Dehydration: resolved.  She is nearing the end of her treatment.  She is weighing whether to do a singular or double mastectomy.  We had a long discussion about risks and benefits of single mastectomy versus double mastectomy we also discussed radiation and its impact on the skin.  She is tentatively planning to have surgery once she completes chemotherapy and then will go on to radiation.  RTC weekly for labs and Taxol, and will see me or Dr. Pamelia [name] with every other treatment.

## 2023-04-04 NOTE — Patient Instructions (Signed)
[facility]  Discharge Instructions: Thank you for choosing [facility] to provide your oncology and hematology care.   If you have a lab appointment with the Cancer Center, please go directly to the Cancer Center and check in at the registration area.   Wear comfortable clothing and clothing appropriate for easy access to any Portacath or PICC line.   We strive to give you quality time with your provider. You may need to reschedule your appointment if you arrive late (15 or more minutes).  Arriving late affects you and other patients whose appointments are after yours.  Also, if you miss three or more appointments without notifying the office, you may be dismissed from the clinic at the provider's discretion.      For prescription refill requests, have your pharmacy contact our office and allow 72 hours for refills to be completed.    Today you received the following chemotherapy and/or immunotherapy agent: Paclitaxel (Taxol)   To help prevent nausea and vomiting after your treatment, we encourage you to take your nausea medication as directed.  BELOW ARE SYMPTOMS THAT SHOULD BE REPORTED IMMEDIATELY: *FEVER GREATER THAN 100.4 F (38 C) OR HIGHER *CHILLS OR SWEATING *NAUSEA AND VOMITING THAT IS NOT CONTROLLED WITH YOUR NAUSEA MEDICATION *UNUSUAL SHORTNESS OF BREATH *UNUSUAL BRUISING OR BLEEDING *URINARY PROBLEMS (pain or burning when urinating, or frequent urination) *BOWEL PROBLEMS (unusual diarrhea, constipation, pain near the anus) TENDERNESS IN MOUTH AND THROAT WITH OR WITHOUT PRESENCE OF ULCERS (sore throat, sores in mouth, or a toothache) UNUSUAL RASH, SWELLING OR PAIN  UNUSUAL VAGINAL DISCHARGE OR ITCHING   Items with * indicate a potential emergency and should be followed up as soon as possible or go to the Emergency Department if any problems should occur.  Please show the CHEMOTHERAPY ALERT CARD or IMMUNOTHERAPY ALERT CARD at  check-in to the Emergency Department and triage nurse.  Should you have questions after your visit or need to cancel or reschedule your appointment, please contact [facility]  Dept: [phone]  and follow the prompts.  Office hours are 8:00 a.m. to 4:30 p.m. Monday - Friday. Please note that voicemails left after 4:00 p.m. may not be returned until the following business day.  We are closed weekends and major holidays. You have access to a nurse at all times for urgent questions. Please call the main number to the clinic Dept: [phone] and follow the prompts.   For any non-urgent questions, you may also contact your provider using MyChart. We now offer e-Visits for anyone 50 and older to request care online for non-urgent symptoms. For details visit [email or web address].   Also download the MyChart app! Go to the app store, search "MyChart", open the app, select [facility], and log in with your MyChart username and password.  Paclitaxel Injection What is this medication? PACLITAXEL (PAK li TAX el) treats some types of cancer. It works by slowing down the growth of cancer cells. This medicine may be used for other purposes; ask your health care provider or pharmacist if you have questions. COMMON BRAND NAME(S): Onxol, Taxol What should I tell my care team before I take this medication? They need to know if you have any of these conditions: Heart disease Liver disease Low white blood cell levels An unusual or allergic reaction to paclitaxel, other medications, foods, dyes, or preservatives If you or your partner are pregnant or trying to get pregnant Breast-feeding  How should I use this medication? This medication is injected into a vein. It is given by your care team in a hospital or clinic setting. Talk to your care team about the use of this medication in children. While it may be given to children for selected conditions, precautions do  apply. Overdosage: If you think you have taken too much of this medicine contact a poison control center or emergency room at once. NOTE: This medicine is only for you. Do not share this medicine with others. What if I miss a dose? Keep appointments for follow-up doses. It is important not to miss your dose. Call your care team if you are unable to keep an appointment. What may interact with this medication? Do not take this medication with any of the following: Live virus vaccines Other medications may affect the way this medication works. Talk with your care team about all of the medications you take. They may suggest changes to your treatment plan to lower the risk of side effects and to make sure your medications work as intended. This list may not describe all possible interactions. Give your health care provider a list of all the medicines, herbs, non-prescription drugs, or dietary supplements you use. Also tell them if you smoke, drink alcohol, or use illegal drugs. Some items may interact with your medicine. What should I watch for while using this medication? Your condition will be monitored carefully while you are receiving this medication. You may need blood work while taking this medication. This medication may make you feel generally unwell. This is not uncommon as chemotherapy can affect healthy cells as well as cancer cells. Report any side effects. Continue your course of treatment even though you feel ill unless your care team tells you to stop. This medication can cause serious allergic reactions. To reduce the risk, your care team may give you other medications to take before receiving this one. Be sure to follow the directions from your care team. This medication may increase your risk of getting an infection. Call your care team for advice if you get a fever, chills, sore throat, or other symptoms of a cold or flu. Do not treat yourself. Try to avoid being around people who are  sick. This medication may increase your risk to bruise or bleed. Call your care team if you notice any unusual bleeding. Be careful brushing or flossing your teeth or using a toothpick because you may get an infection or bleed more easily. If you have any dental work done, tell your dentist you are receiving this medication. Talk to your care team if you may be pregnant. Serious birth defects can occur if you take this medication during pregnancy. Talk to your care team before breastfeeding. Changes to your treatment plan may be needed. What side effects may I notice from receiving this medication? Side effects that you should report to your care team as soon as possible: Allergic reactions--skin rash, itching, hives, swelling of the face, lips, tongue, or throat Heart rhythm changes--fast or irregular heartbeat, dizziness, feeling faint or lightheaded, chest pain, trouble breathing Increase in blood pressure Infection--fever, chills, cough, sore throat, wounds that don't heal, pain or trouble when passing urine, general feeling of discomfort or being unwell Low blood pressure--dizziness, feeling faint or lightheaded, blurry vision Low red blood cell level--unusual weakness or fatigue, dizziness, headache, trouble breathing Painful swelling, warmth, or redness of the skin, blisters or sores at the infusion site Pain, tingling, or numbness in the hands  or feet Slow heartbeat--dizziness, feeling faint or lightheaded, confusion, trouble breathing, unusual weakness or fatigue Unusual bruising or bleeding Side effects that usually do not require medical attention (report to your care team if they continue or are bothersome): Diarrhea Hair loss Joint pain Loss of appetite Muscle pain Nausea Vomiting This list may not describe all possible side effects. Call your doctor for medical advice about side effects. You may report side effects to FDA at 1-800-FDA-1088. Where should I keep my  medication? This medication is given in a hospital or clinic. It will not be stored at home. NOTE: This sheet is a summary. It may not cover all possible information. If you have questions about this medicine, talk to your doctor, pharmacist, or health care provider.  2024 Elsevier/Gold Standard (2022-02-09 00:00:00)

## 2023-04-04 NOTE — Progress Notes (Signed)
[facility] Cancer Follow up:    Caitlin Chimera, MD [address] [place] [address]   DIAGNOSIS:  Cancer Staging  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female, estrogen receptor positive (HCC) Staging form: Breast, AJCC 8th Edition - Clinical: Stage IB (cT1c, cN0, cM0, G3, ER+, PR-, HER2-) - Signed by [name], MD on 09/25/2022 Histologic grading system: 3 grade system - Pathologic: Stage IIIC (pT1c, pN2a, cM0, G3, ER-, PR-, HER2-) - Signed by [name], MD on 10/21/2022 Stage prefix: Initial diagnosis Histologic grading system: 3 grade system   SUMMARY OF ONCOLOGIC HISTORY: Oncology History  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female, estrogen receptor positive (HCC)  09/15/2022 Initial Diagnosis   Screening mammogram detected right breast mass which measured 1.4 cm by ultrasound at 10 o'clock position, biopsy revealed grade 3 IDC with lymphovascular invasion ER 10%, PR 0%, Ki-67 30%, HER2 0 negative   09/25/2022 Cancer Staging   Staging form: Breast, AJCC 8th Edition - Clinical: Stage IB (cT1c, cN0, cM0, G3, ER+, PR-, HER2-) - Signed by [name], MD on 09/25/2022 Histologic grading system: 3 grade system    Genetic Testing   [facility] CancerNext-Expanded Panel+RNA was Negative. Report date is 10/06/2021.  The CancerNext-Expanded gene panel offered by [facility] and includes sequencing, rearrangement, and RNA analysis for the following 77 genes: AIP, ALK, APC, ATM, AXIN2, BAP1, BARD1, BLM, BMPR1A, BRCA1, BRCA2, BRIP1, CDC73, CDH1, CDK4, CDKN1B, CDKN2A, CHEK2, CTNNA1, DICER1, FANCC, FH, FLCN, GALNT12, KIF1B, LZTR1, MAX, MEN1, MET, MLH1, MSH2, MSH3, MSH6, MUTYH, NBN, NF1, NF2, NTHL1, PALB2, PHOX2B, PMS2, POT1, PRKAR1A, PTCH1, PTEN, RAD51C, RAD51D, RB1, RECQL, RET, SDHA, SDHAF2, SDHB, SDHC, SDHD, SMAD4, SMARCA4, SMARCB1, SMARCE1, STK11, SUFU, TMEM127, TP53, TSC1, TSC2, VHL and XRCC2 (sequencing and  deletion/duplication); EGFR, EGLN1, HOXB13, KIT, MITF, PDGFRA, POLD1, and POLE (sequencing only); EPCAM and GREM1 (deletion/duplication only).    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 with high-grade DCIS, inferior margin positive, angiolymphatic invasion present, 5/5 lymph nodes positive, additional medial margin: Poorly differentiated carcinoma with lymphoid stroma, additional inferior margin: Positive, ER 10% weak, PR 0%, HER2 0, Ki-67 30%   10/21/2022 Cancer Staging   Staging form: Breast, AJCC 8th Edition - Pathologic: Stage IIIC (pT1c, pN2a, cM0, G3, ER-, PR-, HER2-) - Signed by [name], MD on 10/21/2022 Stage prefix: Initial diagnosis Histologic grading system: 3 grade system   11/08/2022 Surgery   Margin reexcision: Inferior margin: Grade 3 IDC, new margin positive Medial margin: Grade 3 IDC new margin widely involved   11/18/2022 Surgery   Right inferior margin reexcision: Invasive poorly differentiated adenocarcinoma grade 3 present in new inferior margin.  Medial margin reexcision: Invasive poorly differentiated adenocarcinoma grade 3 present at new medial margin, posterior margin: Poorly differentiated adenocarcinoma grade 3, margin negative  11/08/2022 and 11/18/2022: Final margin focal positivity. After much discussion the plan is to finish her systemic treatment and come back at a later point to do mastectomy.    12/02/2022 -  Chemotherapy   Patient is on Treatment Plan : BREAST ADJUVANT DOSE DENSE AC q14d / PACLitaxel q7d       CURRENT THERAPY: Weekly Taxol  INTERVAL HISTORY: Caitlin [name] 67 y.o. female returns for f/u and evaluation prior to receiving her eighth dose of weekly Taxol.  She had breast pain last week, but none today.  She denies any erythema, swelling tenderness, or  changes in her chest wall.  She also denies any peripheral neuropathy.  She is mildly fatigued,  but remains active.  She is otherwise doing well and has no concerns today.    Patient Active Problem List   Diagnosis Date Noted   Port-A-Cath in place 12/16/2022   Genetic testing 10/11/2022   Appendicitis with peritonitis 09/29/2022   Family history of prostate cancer 09/29/2022   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female, estrogen receptor positive (HCC) 09/22/2022   Atrial fibrillation with RVR (HCC) 11/30/2010   Chronic anticoagulation 11/30/2010   Hypothyroidism 11/30/2010   PALPITATIONS 11/27/2010    is allergic to penicillins.  MEDICAL HISTORY: Past Medical History:  Diagnosis Date   Asthma    Dyspnea    History of kidney stones    Hypothyroidism    rt breast ca 08/2022   Right Breast   Scoliosis    Thyroid disease     SURGICAL HISTORY: Past Surgical History:  Procedure Laterality Date   APPENDECTOMY     AXILLARY SENTINEL NODE BIOPSY Right 10/14/2022   Procedure: RIGHT AXILLARY SENTINEL NODE BIOPSY;  Surgeon: [name], MD;  Location: [facility] OR;  Service: General;  Laterality: Right;   BREAST BIOPSY Right 09/15/2022   [place] RT BREAST BX W LOC DEV 1ST LESION IMG BX SPEC US GUIDE 09/15/2022 [facility]MO[facility]HY   BREAST BIOPSY  10/12/2022   [place] RT RADIOACTIVE SEED LOC 10/12/2022 [facility] MAMMOGRAPHY   BREAST LUMPECTOMY WITH RADIOACTIVE SEED AND SENTINEL LYMPH NODE BIOPSY Right 10/14/2022   Procedure: RIGHT BREAST LUMPECTOMY WITH RADIOACTIVE SEED;  Surgeon: [name], MD;  Location[facility] OR;  Service: General;  Laterality: Right;   COLONOSCOPY     LAPAROSCOPIC APPENDECTOMY     PORTACATH PLACEMENT Left 10/14/2022   Procedure: INSERTION PORT-A-CATH;  Surgeon: [name], MD;  Location: [facility] OR;  Service: General;  Laterality: Left;   RE-EXCISION OF BREAST LUMPECTOMY Right 11/08/2022   Procedure: RE-EXCISION OF RIGHT BREAST LUMPECTOMY;  Surgeon: [name], MD;  Location: [facility];  Service: General;   Laterality: Right;   RE-EXCISION OF BREAST LUMPECTOMY Right 11/18/2022   Procedure: RE-EXCISION OF RIGHT BREAST LUMPECTOMY;  Surgeon: [name], MD;  Location: [facility] OR;  Service: General;  Laterality: Right;   WISDOM TOOTH EXTRACTION      SOCIAL HISTORY: Social History   Socioeconomic History   Marital status: Married    Spouse name: Not on file   Number of children: Not on file   Years of education: Not on file   Highest education level: Not on file  Occupational History   Not on file  Tobacco Use   Smoking status: Never   Smokeless tobacco: Not on file  Vaping Use   Vaping Use: Never used  Substance and Sexual Activity   Alcohol use: Not Currently   Drug use: Never   Sexual activity: Not Currently  Other Topics Concern   Not on file  Social History Narrative   Not on file   Social Determinants of Health   Financial Resource Strain: Not on file  Food Insecurity: Not on file  Transportation Needs: Not on file  Physical Activity: Not on file  Stress: Not on file  Social Connections: Not on file  Intimate Partner Violence: Not on file    FAMILY HISTORY: Family History  Problem Relation Age of Onset   Thyroid disease Mother    Prostate cancer Father 76       metastatic  Hypertension Father    Thyroid disease Sister        twin sister age 64   Asthma Sister    Prostate cancer Brother 68   Heart attack Maternal Grandmother        died age 11's   Heart attack Maternal Grandfather        died age 50's   Clotting disorder Paternal Grandmother        cerebral hemorrage    Review of Systems  Constitutional:  Positive for fatigue. Negative for appetite change, chills, fever and unexpected weight change.  HENT:   Negative for hearing loss, lump/mass and trouble swallowing.   Eyes:  Negative for eye problems and icterus.  Respiratory:  Negative for chest tightness, cough and shortness of breath.   Cardiovascular:  Negative for chest pain, leg swelling and  palpitations.  Gastrointestinal:  Negative for abdominal distention, abdominal pain, constipation, diarrhea, nausea and vomiting.  Endocrine: Negative for hot flashes.  Genitourinary:  Negative for difficulty urinating.   Musculoskeletal:  Negative for arthralgias.  Skin:  Negative for itching and rash.  Neurological:  Negative for dizziness, extremity weakness, headaches and numbness.  Hematological:  Negative for adenopathy. Does not bruise/bleed easily.  Psychiatric/Behavioral:  Negative for depression. The patient is not nervous/anxious.       PHYSICAL EXAMINATION   Onc Performance Status - 04/04/23 0800       KPS SCALE   KPS % SCORE Normal activity with effort, some s/s of disease             Vitals:   04/04/23 0828  BP: 125/79  Pulse: 80  Resp: 18  Temp: 98.1 F (36.7 C)  SpO2: 98%    Physical Exam Constitutional:      General: She is not in acute distress.    Appearance: Normal appearance. She is not toxic-appearing.  HENT:     Head: Normocephalic and atraumatic.     Mouth/Throat:     Mouth: Mucous membranes are moist.     Pharynx: Oropharynx is clear. No oropharyngeal exudate or posterior oropharyngeal erythema.  Eyes:     General: No scleral icterus. Cardiovascular:     Rate and Rhythm: Normal rate and regular rhythm.     Pulses: Normal pulses.     Heart sounds: Normal heart sounds.  Pulmonary:     Effort: Pulmonary effort is normal.     Breath sounds: Normal breath sounds.  Abdominal:     General: Abdomen is flat. Bowel sounds are normal. There is no distension.     Palpations: Abdomen is soft.     Tenderness: There is no abdominal tenderness.  Musculoskeletal:        General: No swelling.     Cervical back: Neck supple.  Lymphadenopathy:     Cervical: No cervical adenopathy.  Skin:    General: Skin is warm and dry.     Findings: No rash.  Neurological:     General: No focal deficit present.     Mental Status: She is alert.  Psychiatric:         Mood and Affect: Mood normal.        Behavior: Behavior normal.     LABORATORY DATA:  CBC    Component Value Date/Time   WBC 3.2 (L) 04/04/2023 0810   WBC 11.1 (H) 10/13/2022 0930   RBC 3.48 (L) 04/04/2023 0810   HGB 11.3 (L) 04/04/2023 0810   HCT 34.0 (L) 04/04/2023 0810   PLT 282  04/04/2023 0810   MCV 97.7 04/04/2023 0810   MCH 32.5 04/04/2023 0810   MCHC 33.2 04/04/2023 0810   RDW 13.4 04/04/2023 0810   LYMPHSABS 0.8 04/04/2023 0810   MONOABS 0.4 04/04/2023 0810   EOSABS 0.1 04/04/2023 0810   BASOSABS 0.0 04/04/2023 0810    CMP     Component Value Date/Time   NA 140 03/28/2023 0733   K 3.7 03/28/2023 0733   CL 107 03/28/2023 0733   CO2 27 03/28/2023 0733   GLUCOSE 96 03/28/2023 0733   BUN 15 03/28/2023 0733   CREATININE 0.75 03/28/2023 0733   CALCIUM 9.4 03/28/2023 0733   PROT 6.3 (L) 03/28/2023 0733   ALBUMIN 4.0 03/28/2023 0733   AST 17 03/28/2023 0733   ALT 15 03/28/2023 0733   ALKPHOS 77 03/28/2023 0733   BILITOT 0.3 03/28/2023 0733   GFRNONAA >60 03/28/2023 0733    ASSESSMENT and THERAPY PLAN:   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female, estrogen receptor positive (HCC) Caitlin [name] is a 67 year old woman with stage IIIc triple negative breast cancer diagnosed in December 2023 status post right lumpectomy, reexcision, now continuing on adjuvant chemotherapy with Adriamycin and Cytoxan to be followed by weekly paclitaxel.  Treatment plan:  1.  Surgery  2.  Adjuvant chemotherapy  3.  Mastectomy  4.  Adjuvant radiation therapy  Chemo Toxicities:   Treatment related fatigue: she remains active, which I continue to encourage.  She is practicing energy conservation. Nausea-resolved Constipation-resolved Chemotherapy-induced anemia: Her hemoglobin today is improved at 11.3. Hypokalemia: potassium level pending Dehydration: resolved.  She is nearing the end of her treatment.  She is weighing whether to do a singular or  double mastectomy.  We had a long discussion about risks and benefits of single mastectomy versus double mastectomy we also discussed radiation and its impact on the skin.  She is tentatively planning to have surgery once she completes chemotherapy and then will go on to radiation.  RTC weekly for labs and Taxol, and will see me or Dr. Pamelia Hoit with every other treatment.     All questions were answered. The patient knows to call the clinic with any problems, questions or concerns. We can certainly see the patient much sooner if necessary.  Total encounter time:30 minutes*in face-to-face visit time, chart review, lab review, care coordination, order entry, and documentation of the encounter time.  Lillard Anes, NP 04/04/23 8:56 AM Medical Oncology and Hematology [facility] [address] [place], [place] [address] Tel. [phone]    Fax. [phone]  *Total Encounter Time as defined by the Centers for Medicare and Medicaid Services includes, in addition to the face-to-face time of a patient visit (documented in the note above) non-face-to-face time: obtaining and reviewing outside history, ordering and reviewing medications, tests or procedures, care coordination (communications with other health care professionals or caregivers) and documentation in the medical record.

## 2023-04-05 ENCOUNTER — Telehealth: Payer: Self-pay | Admitting: Radiation Oncology

## 2023-04-05 NOTE — Telephone Encounter (Signed)
Called patient to schedule a FUN visit w. Dr. Kinard. No answer, LVM for a return call.  

## 2023-04-05 NOTE — Telephone Encounter (Signed)
Called patient to schedule a FUN visit w. Dr. Roselind Messier, patient stated she has a follow up appt w. her surgeon on 7/19 to discuss future surgery date. Since patient is in need of more surgery the current referral will be closed. Inbasket sent to Lillard Anes and Angie Fava to send new referral once new surgery date is scheduled.

## 2023-04-08 MED FILL — Dexamethasone Sodium Phosphate Inj 100 MG/10ML: INTRAMUSCULAR | Qty: 1 | Status: AC

## 2023-04-11 ENCOUNTER — Inpatient Hospital Stay: Payer: BC Managed Care – PPO

## 2023-04-11 ENCOUNTER — Other Ambulatory Visit: Payer: Self-pay

## 2023-04-11 VITALS — BP 122/72 | HR 82 | Temp 98.2°F | Resp 18

## 2023-04-11 DIAGNOSIS — Z170 Estrogen receptor positive status [ER+]: Secondary | ICD-10-CM

## 2023-04-11 DIAGNOSIS — Z95828 Presence of other vascular implants and grafts: Secondary | ICD-10-CM

## 2023-04-11 DIAGNOSIS — Z5111 Encounter for antineoplastic chemotherapy: Secondary | ICD-10-CM | POA: Diagnosis not present

## 2023-04-11 LAB — CMP (CANCER CENTER ONLY)
ALT: 11 U/L (ref 0–44)
AST: 15 U/L (ref 15–41)
Albumin: 3.9 g/dL (ref 3.5–5.0)
Alkaline Phosphatase: 78 U/L (ref 38–126)
Anion gap: 7 (ref 5–15)
BUN: 14 mg/dL (ref 8–23)
CO2: 26 mmol/L (ref 22–32)
Calcium: 9.5 mg/dL (ref 8.9–10.3)
Chloride: 107 mmol/L (ref 98–111)
Creatinine: 0.79 mg/dL (ref 0.44–1.00)
GFR, Estimated: >60 mL/min (ref >60)
Glucose, Bld: 83 mg/dL (ref 70–99)
Potassium: 3.8 mmol/L (ref 3.5–5.1)
Sodium: 140 mmol/L (ref 135–145)
Total Bilirubin: 0.3 mg/dL (ref 0.3–1.2)
Total Protein: 6.7 g/dL (ref 6.5–8.1)

## 2023-04-11 LAB — CBC WITH DIFFERENTIAL (CANCER CENTER ONLY)
Abs Immature Granulocytes: 0.03 10*3/uL (ref 0.00–0.07)
Basophils Absolute: 0 10*3/uL (ref 0.0–0.1)
Basophils Relative: 1 %
Eosinophils Absolute: 0.1 10*3/uL (ref 0.0–0.5)
Eosinophils Relative: 4 %
HCT: 34.3 % — ABNORMAL LOW (ref 36.0–46.0)
Hemoglobin: 11.5 g/dL — ABNORMAL LOW (ref 12.0–15.0)
Immature Granulocytes: 1 %
Lymphocytes Relative: 23 %
Lymphs Abs: 0.8 10*3/uL (ref 0.7–4.0)
MCH: 32.8 pg (ref 26.0–34.0)
MCHC: 33.5 g/dL (ref 30.0–36.0)
MCV: 97.7 fL (ref 80.0–100.0)
Monocytes Absolute: 0.4 10*3/uL (ref 0.1–1.0)
Monocytes Relative: 12 %
Neutro Abs: 2 10*3/uL (ref 1.7–7.7)
Neutrophils Relative %: 59 %
Platelet Count: 277 10*3/uL (ref 150–400)
RBC: 3.51 MIL/uL — ABNORMAL LOW (ref 3.87–5.11)
RDW: 13.5 % (ref 11.5–15.5)
WBC Count: 3.4 10*3/uL — ABNORMAL LOW (ref 4.0–10.5)
nRBC: 0 % (ref 0.0–0.2)

## 2023-04-11 MED ORDER — FAMOTIDINE IN NACL 20-0.9 MG/50ML-% IV SOLN
20.0000 mg | Freq: Once | INTRAVENOUS | Status: AC
  Administered 2023-04-11: 20 mg via INTRAVENOUS
  Filled 2023-04-11: qty 50

## 2023-04-11 MED ORDER — SODIUM CHLORIDE 0.9% FLUSH
10.0000 mL | Freq: Once | INTRAVENOUS | Status: AC
  Administered 2023-04-11: 10 mL

## 2023-04-11 MED ORDER — SODIUM CHLORIDE 0.9 % IV SOLN
10.0000 mg | Freq: Once | INTRAVENOUS | Status: AC
  Administered 2023-04-11: 10 mg via INTRAVENOUS
  Filled 2023-04-11: qty 10

## 2023-04-11 MED ORDER — SODIUM CHLORIDE 0.9 % IV SOLN
70.0000 mg/m2 | Freq: Once | INTRAVENOUS | Status: AC
  Administered 2023-04-11: 126 mg via INTRAVENOUS
  Filled 2023-04-11: qty 21

## 2023-04-11 MED ORDER — DIPHENHYDRAMINE HCL 50 MG/ML IJ SOLN
25.0000 mg | Freq: Once | INTRAMUSCULAR | Status: AC
  Administered 2023-04-11: 25 mg via INTRAVENOUS
  Filled 2023-04-11: qty 1

## 2023-04-11 MED ORDER — SODIUM CHLORIDE 0.9 % IV SOLN: Freq: Once | INTRAVENOUS | Status: AC

## 2023-04-15 ENCOUNTER — Encounter: Payer: Self-pay | Admitting: Hematology and Oncology

## 2023-04-15 MED FILL — Dexamethasone Sodium Phosphate Inj 100 MG/10ML: INTRAMUSCULAR | Qty: 1 | Status: AC

## 2023-04-16 NOTE — Progress Notes (Signed)
Patient Care Team: [name], MD as PCP - General (Unknown Physician Specialty) Pershing Proud, RN as Oncology Nurse Navigator Donnelly Angelica, RN as Oncology Nurse Navigator [name], MD as Consulting Physician (Hematology and Oncology) Antony Blackbird, MD as Consulting Physician (Radiation Oncology) [name], MD as Consulting Physician (General Surgery)  DIAGNOSIS:  Encounter Diagnosis  Name Primary?   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female, estrogen receptor positive (HCC) Yes    SUMMARY OF ONCOLOGIC HISTORY: Oncology History  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female, estrogen receptor positive (HCC)  09/15/2022 Initial Diagnosis   Screening mammogram detected right breast mass which measured 1.4 cm by ultrasound at 10 o'clock position, biopsy revealed grade 3 IDC with lymphovascular invasion ER 10%, PR 0%, Ki-67 30%, HER2 0 negative   09/25/2022 Cancer Staging   Staging form: Breast, AJCC 8th Edition - Clinical: Stage IB (cT1c, cN0, cM0, G3, ER+, PR-, HER2-) - Signed by [name], MD on 09/25/2022 Histologic grading system: 3 grade system    Genetic Testing   [facility] CancerNext-Expanded Panel+RNA was Negative. Report date is 10/06/2021.  The CancerNext-Expanded gene panel offered by [facility] and includes sequencing, rearrangement, and RNA analysis for the following 77 genes: AIP, ALK, APC, ATM, AXIN2, BAP1, BARD1, BLM, BMPR1A, BRCA1, BRCA2, BRIP1, CDC73, CDH1, CDK4, CDKN1B, CDKN2A, CHEK2, CTNNA1, DICER1, FANCC, FH, FLCN, GALNT12, KIF1B, LZTR1, MAX, MEN1, MET, MLH1, MSH2, MSH3, MSH6, MUTYH, NBN, NF1, NF2, NTHL1, PALB2, PHOX2B, PMS2, POT1, PRKAR1A, PTCH1, PTEN, RAD51C, RAD51D, RB1, RECQL, RET, SDHA, SDHAF2, SDHB, SDHC, SDHD, SMAD4, SMARCA4, SMARCB1, SMARCE1, STK11, SUFU, TMEM127, TP53, TSC1, TSC2, VHL and XRCC2 (sequencing and deletion/duplication); EGFR, EGLN1, HOXB13, KIT, MITF, PDGFRA, POLD1, and  POLE (sequencing only); EPCAM and GREM1 (deletion/duplication only).    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 with high-grade DCIS, inferior margin positive, angiolymphatic invasion present, 5/5 lymph nodes positive, additional medial margin: Poorly differentiated carcinoma with lymphoid stroma, additional inferior margin: Positive, ER 10% weak, PR 0%, HER2 0, Ki-67 30%   10/21/2022 Cancer Staging   Staging form: Breast, AJCC 8th Edition - Pathologic: Stage IIIC (pT1c, pN2a, cM0, G3, ER-, PR-, HER2-) - Signed by [name], MD on 10/21/2022 Stage prefix: Initial diagnosis Histologic grading system: 3 grade system   11/08/2022 Surgery   Margin reexcision: Inferior margin: Grade 3 IDC, new margin positive Medial margin: Grade 3 IDC new margin widely involved   11/18/2022 Surgery   Right inferior margin reexcision: Invasive poorly differentiated adenocarcinoma grade 3 present in new inferior margin.  Medial margin reexcision: Invasive poorly differentiated adenocarcinoma grade 3 present at new medial margin, posterior margin: Poorly differentiated adenocarcinoma grade 3, margin negative  11/08/2022 and 11/18/2022: Final margin focal positivity. After much discussion the plan is to finish her systemic treatment and come back at a later point to do mastectomy.    12/02/2022 -  Chemotherapy   Patient is on Treatment Plan : BREAST ADJUVANT DOSE DENSE AC q14d / PACLitaxel q7d       CHIEF COMPLIANT:  Follow-up on Taxol cycle 10  INTERVAL HISTORY: UMA JERDE is a 67 y.o. female is here because of recent of right breast cancer.  Currently on Taxol. She presents to the clinic for a follow-up. Pt reports that she is doing good. She does still have some fatigue and complains of pain in right breast with discoloration. Denies any neuropathy.  Bowels are moving ok. Taste and appetite is good.   ALLERGIES:  is  allergic to penicillins.  MEDICATIONS:  Current Outpatient Medications  Medication Sig Dispense Refill   acetaminophen (TYLENOL) 500 MG tablet Take 500 mg by mouth every 6 (six) hours as needed for moderate pain.     albuterol (VENTOLIN HFA) 108 (90 Base) MCG/ACT inhaler Inhale 2 puffs into the lungs every 4 (four) hours as needed for shortness of breath (Asthma).     aspirin 81 MG tablet Take 81 mg by mouth daily.       benzonatate (TESSALON) 200 MG capsule Take 200 mg by mouth daily as needed for cough.     diltiazem (CARDIZEM CD) 120 MG 24 hr capsule Take 120 mg by mouth daily.     levothyroxine (SYNTHROID) 75 MCG tablet Take 75 mcg by mouth daily before breakfast.     lidocaine-prilocaine (EMLA) cream Apply to affected area once 30 g 3   Multiple Vitamins-Minerals (MULTIVITAMIN WITH MINERALS) tablet Take 1 tablet by mouth daily.     ondansetron (ZOFRAN) 8 MG tablet Take 1 tab (8 mg) by mouth every 8 hrs as needed for nausea/vomiting. Start third day after doxorubicin/cyclophosphamide chemotherapy. 30 tablet 1   OVER THE COUNTER MEDICATION Take 1 capsule by mouth 2 (two) times daily. copaiba softgels     prochlorperazine (COMPAZINE) 10 MG tablet TAKE 1 TABLET BY MOUTH EVERY 6 HOURS AS NEEDED FOR NAUSEA / FOR VOMITING 30 tablet 1   PULMICORT FLEXHALER 180 MCG/ACT inhaler Inhale 1 puff into the lungs daily.     traMADol (ULTRAM) 50 MG tablet Take 1 tablet (50 mg total) by mouth every 6 (six) hours as needed. 10 tablet 0   triamcinolone cream (KENALOG) 0.1 % Apply 1 Application topically daily as needed (Dry skin).     azithromycin (ZITHROMAX Z-PAK) 250 MG tablet Take as directed (Patient not taking: Reported on 04/04/2023) 6 each 0   dexamethasone (DECADRON) 4 MG tablet Take 2 tablets (8 mg total) by mouth daily for 3 days. Start the day after doxorubicin/cyclophosphamide chemotherapy. Take with food. (Patient not taking: Reported on 03/21/2023) 30 tablet 1   No current facility-administered  medications for this visit.    PHYSICAL EXAMINATION: ECOG PERFORMANCE STATUS: 1 - Symptomatic but completely ambulatory  Vitals:   04/18/23 0757  BP: 123/78  Pulse: 81  Resp: 18  Temp: (!) 97.2 F (36.2 C)  SpO2: 97%   Filed Weights   04/18/23 0757  Weight: 148 lb 4.8 oz (67.3 kg)    BREAST: Palpable fullness in the right breast with bruising like discoloration. (exam performed in the presence of a chaperone)  LABORATORY DATA:  I have reviewed the data as listed    Latest Ref Rng & Units 04/11/2023    7:40 AM 04/04/2023    8:10 AM 03/28/2023    7:33 AM  CMP  Glucose 70 - 99 mg/dL 83  87  96   BUN 8 - 23 mg/dL 14  14  15    Creatinine 0.44 - 1.00 mg/dL 6.21  3.08  6.57   Sodium 135 - 145 mmol/L 140  139  140   Potassium 3.5 - 5.1 mmol/L 3.8  3.8  3.7   Chloride 98 - 111 mmol/L 107  106  107   CO2 22 - 32 mmol/L 26  26  27    Calcium 8.9 - 10.3 mg/dL 9.5  8.8  9.4   Total Protein 6.5 - 8.1 g/dL 6.7  6.7  6.3   Total Bilirubin 0.3 - 1.2 mg/dL 0.3  0.4  0.3   Alkaline Phos 38 - 126 U/L 78  78  77   AST 15 - 41 U/L 15  15  17    ALT 0 - 44 U/L 11  12  15      Lab Results  Component Value Date   WBC 3.1 (L) 04/18/2023   HGB 11.2 (L) 04/18/2023   HCT 33.7 (L) 04/18/2023   MCV 96.8 04/18/2023   PLT 271 04/18/2023   NEUTROABS 1.9 04/18/2023    ASSESSMENT & PLAN:  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female, estrogen receptor positive (HCC) 10/14/2022:Right lumpectomy: 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  No distant metastatic disease. Bone scan 10/29/2022: No evidence of bone metastasis   11/08/2022 and 11/18/2022: Margin reexcision surgeries: Final margin focal positivity.  After much discussion the plan is to finish her systemic treatment and come back at a later point to do mastectomy.   Treatment plan: Adjuvant chemotherapy with dose dense Adriamycin and Cytoxan followed by Taxol to be completed 05/02/2023 Mastectomy by Dr. Dwain Sarna Adjuvant radiation Very little to mild benefit from antiestrogen therapy and therefore we decided that she would not need to take it.  ------------------------------------------------------------------------------------------------------------------------------------------ Current treatment: Completed 4 cycles of dose dense Adriamycin and Cytoxan, today is cycle 10 Taxol Chemo toxicities: Alopecia Right lower lobe pneumonia: Held treatment on 01/31/2023 completed antibiotics.  Weight loss: Stabilized since Taxol was started Fatigue Leukopenia: Monitoring very closely today's ANC is 1.9 Chemo-induced anemia: Hemoglobin is 11.2     We are monitoring her blood counts closely.  She has an appointment to see Dr. Dwain Sarna next Friday.  We will try to obtain an immediate mammogram and ultrasound of the right breast because of this new finding of bruising and swelling with slight discomfort in the right breast.  I will see the patient back after radiation is complete. I recommended Signatera testing for minimal residual disease monitoring after she completes with chemo.   Orders Placed This Encounter  Procedures   MM DIAG BREAST TOMO BILATERAL    Standing Status:   Future    Standing Expiration Date:   04/17/2024    Order Specific Question:   Reason for Exam (SYMPTOM  OR DIAGNOSIS REQUIRED)    Answer:   right breast dicloration and pain    Order Specific Question:   Preferred imaging  location?    [facility]   [place] LIMITED ULTRASOUND INCLUDING AXILLA RIGHT BREAST    Standing Status:   Future    Standing Expiration Date:   04/17/2024    Order Specific Question:   Reason for Exam (SYMPTOM  OR DIAGNOSIS REQUIRED)    Answer:   breast pain and discloration    Order Specific Question:   Preferred imaging location?    Answer:   [facility]   The patient has a good understanding of the overall plan. she agrees with it. she will call with any problems that may develop before the  next visit here. Total time spent: 30 mins including face to face time and time spent for planning, charting and co-ordination of care   Tamsen Meek, MD 04/18/23    I Janan Ridge am acting as a Neurosurgeon for [employer]  I have reviewed the above documentation for accuracy and completeness, and I agree with the above.

## 2023-04-18 ENCOUNTER — Other Ambulatory Visit: Payer: Self-pay

## 2023-04-18 ENCOUNTER — Inpatient Hospital Stay: Payer: BC Managed Care – PPO

## 2023-04-18 ENCOUNTER — Inpatient Hospital Stay: Payer: BC Managed Care – PPO | Admitting: Hematology and Oncology

## 2023-04-18 VITALS — BP 123/78 | HR 81 | Temp 97.2°F | Resp 18 | Ht 63.0 in | Wt 148.3 lb

## 2023-04-18 DIAGNOSIS — C50411 Malignant neoplasm of upper-outer quadrant of right female breast: Secondary | ICD-10-CM

## 2023-04-18 DIAGNOSIS — Z170 Estrogen receptor positive status [ER+]: Secondary | ICD-10-CM

## 2023-04-18 DIAGNOSIS — Z95828 Presence of other vascular implants and grafts: Secondary | ICD-10-CM

## 2023-04-18 DIAGNOSIS — Z5111 Encounter for antineoplastic chemotherapy: Secondary | ICD-10-CM | POA: Diagnosis not present

## 2023-04-18 LAB — CMP (CANCER CENTER ONLY)
ALT: 11 U/L (ref 0–44)
AST: 15 U/L (ref 15–41)
Albumin: 4.1 g/dL (ref 3.5–5.0)
Alkaline Phosphatase: 63 U/L (ref 38–126)
Anion gap: 8 (ref 5–15)
BUN: 14 mg/dL (ref 8–23)
CO2: 25 mmol/L (ref 22–32)
Calcium: 9.1 mg/dL (ref 8.9–10.3)
Chloride: 107 mmol/L (ref 98–111)
Creatinine: 0.73 mg/dL (ref 0.44–1.00)
GFR, Estimated: >60 mL/min (ref >60)
Glucose, Bld: 89 mg/dL (ref 70–99)
Potassium: 3.7 mmol/L (ref 3.5–5.1)
Sodium: 140 mmol/L (ref 135–145)
Total Bilirubin: 0.4 mg/dL (ref 0.3–1.2)
Total Protein: 6.5 g/dL (ref 6.5–8.1)

## 2023-04-18 LAB — CBC WITH DIFFERENTIAL (CANCER CENTER ONLY)
Abs Immature Granulocytes: 0.02 10*3/uL (ref 0.00–0.07)
Basophils Absolute: 0 10*3/uL (ref 0.0–0.1)
Basophils Relative: 1 %
Eosinophils Absolute: 0.1 10*3/uL (ref 0.0–0.5)
Eosinophils Relative: 4 %
HCT: 33.7 % — ABNORMAL LOW (ref 36.0–46.0)
Hemoglobin: 11.2 g/dL — ABNORMAL LOW (ref 12.0–15.0)
Immature Granulocytes: 1 %
Lymphocytes Relative: 23 %
Lymphs Abs: 0.7 10*3/uL (ref 0.7–4.0)
MCH: 32.2 pg (ref 26.0–34.0)
MCHC: 33.2 g/dL (ref 30.0–36.0)
MCV: 96.8 fL (ref 80.0–100.0)
Monocytes Absolute: 0.4 10*3/uL (ref 0.1–1.0)
Monocytes Relative: 12 %
Neutro Abs: 1.9 10*3/uL (ref 1.7–7.7)
Neutrophils Relative %: 59 %
Platelet Count: 271 10*3/uL (ref 150–400)
RBC: 3.48 MIL/uL — ABNORMAL LOW (ref 3.87–5.11)
RDW: 13.4 % (ref 11.5–15.5)
WBC Count: 3.1 10*3/uL — ABNORMAL LOW (ref 4.0–10.5)
nRBC: 0 % (ref 0.0–0.2)

## 2023-04-18 MED ORDER — SODIUM CHLORIDE 0.9 % IV SOLN
10.0000 mg | Freq: Once | INTRAVENOUS | Status: AC
  Administered 2023-04-18: 10 mg via INTRAVENOUS
  Filled 2023-04-18: qty 10

## 2023-04-18 MED ORDER — SODIUM CHLORIDE 0.9 % IV SOLN
70.0000 mg/m2 | Freq: Once | INTRAVENOUS | Status: AC
  Administered 2023-04-18: 126 mg via INTRAVENOUS
  Filled 2023-04-18: qty 21

## 2023-04-18 MED ORDER — DIPHENHYDRAMINE HCL 50 MG/ML IJ SOLN
25.0000 mg | Freq: Once | INTRAMUSCULAR | Status: AC
  Administered 2023-04-18: 25 mg via INTRAVENOUS
  Filled 2023-04-18: qty 1

## 2023-04-18 MED ORDER — SODIUM CHLORIDE 0.9 % IV SOLN: Freq: Once | INTRAVENOUS | Status: AC

## 2023-04-18 MED ORDER — FAMOTIDINE IN NACL 20-0.9 MG/50ML-% IV SOLN
20.0000 mg | Freq: Once | INTRAVENOUS | Status: AC
  Administered 2023-04-18: 20 mg via INTRAVENOUS
  Filled 2023-04-18: qty 50

## 2023-04-18 MED ORDER — HEPARIN SOD (PORK) LOCK FLUSH 100 UNIT/ML IV SOLN
500.0000 [IU] | Freq: Once | INTRAVENOUS | Status: AC | PRN
  Administered 2023-04-18: 500 [IU]

## 2023-04-18 MED ORDER — SODIUM CHLORIDE 0.9% FLUSH
10.0000 mL | INTRAVENOUS | Status: DC | PRN
  Administered 2023-04-18: 10 mL

## 2023-04-18 MED ORDER — SODIUM CHLORIDE 0.9% FLUSH
10.0000 mL | Freq: Once | INTRAVENOUS | Status: AC
  Administered 2023-04-18: 10 mL

## 2023-04-18 NOTE — Patient Instructions (Signed)
[facility]  Discharge Instructions: Thank you for choosing [facility] to provide your oncology and hematology care.   If you have a lab appointment with the Cancer Center, please go directly to the Cancer Center and check in at the registration area.   Wear comfortable clothing and clothing appropriate for easy access to any Portacath or PICC line.   We strive to give you quality time with your provider. You may need to reschedule your appointment if you arrive late (15 or more minutes).  Arriving late affects you and other patients whose appointments are after yours.  Also, if you miss three or more appointments without notifying the office, you may be dismissed from the clinic at the provider's discretion.      For prescription refill requests, have your pharmacy contact our office and allow 72 hours for refills to be completed.    Today you received the following chemotherapy and/or immunotherapy agents Paclitaxel      To help prevent nausea and vomiting after your treatment, we encourage you to take your nausea medication as directed.  BELOW ARE SYMPTOMS THAT SHOULD BE REPORTED IMMEDIATELY: *FEVER GREATER THAN 100.4 F (38 C) OR HIGHER *CHILLS OR SWEATING *NAUSEA AND VOMITING THAT IS NOT CONTROLLED WITH YOUR NAUSEA MEDICATION *UNUSUAL SHORTNESS OF BREATH *UNUSUAL BRUISING OR BLEEDING *URINARY PROBLEMS (pain or burning when urinating, or frequent urination) *BOWEL PROBLEMS (unusual diarrhea, constipation, pain near the anus) TENDERNESS IN MOUTH AND THROAT WITH OR WITHOUT PRESENCE OF ULCERS (sore throat, sores in mouth, or a toothache) UNUSUAL RASH, SWELLING OR PAIN  UNUSUAL VAGINAL DISCHARGE OR ITCHING   Items with * indicate a potential emergency and should be followed up as soon as possible or go to the Emergency Department if any problems should occur.  Please show the CHEMOTHERAPY ALERT CARD or IMMUNOTHERAPY ALERT CARD at  check-in to the Emergency Department and triage nurse.  Should you have questions after your visit or need to cancel or reschedule your appointment, please contact [facility]  Dept: [phone]  and follow the prompts.  Office hours are 8:00 a.m. to 4:30 p.m. Monday - Friday. Please note that voicemails left after 4:00 p.m. may not be returned until the following business day.  We are closed weekends and major holidays. You have access to a nurse at all times for urgent questions. Please call the main number to the clinic Dept: [phone] and follow the prompts.   For any non-urgent questions, you may also contact your provider using MyChart. We now offer e-Visits for anyone 18 and older to request care online for non-urgent symptoms. For details visit [email or web address].   Also download the MyChart app! Go to the app store, search "MyChart", open the app, select [place], and log in with your MyChart username and password.   

## 2023-04-18 NOTE — Assessment & Plan Note (Addendum)
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  No distant metastatic disease. Bone scan 10/29/2022: No evidence of bone metastasis   11/08/2022 and 11/18/2022: Margin reexcision surgeries: Final margin focal positivity.  After much discussion the plan is to finish her systemic treatment and come back at a later point to do mastectomy.   Treatment plan: Adjuvant chemotherapy with dose dense Adriamycin and Cytoxan followed by Taxol to be completed 05/02/2023 Mastectomy by Dr. Dwain Sarna Adjuvant radiation Very little to mild benefit from antiestrogen therapy and therefore we decided that she would not need to take it.  ------------------------------------------------------------------------------------------------------------------------------------------ Current treatment: Completed 4 cycles of dose dense Adriamycin and Cytoxan, today is cycle 10 Taxol Chemo toxicities: Alopecia Right lower lobe pneumonia: Held treatment on 01/31/2023 completed antibiotics.  Weight loss: Stabilized since Taxol was started Fatigue Leukopenia: Monitoring very closely today's ANC is 2.1. Chemo-induced anemia: Hemoglobin is 10.7 and has improved slightly.   We are monitoring her blood counts closely.  She finishes chemotherapy next week. Subsequently she will start radiation. Return to clinic after radiation is completed.

## 2023-04-19 ENCOUNTER — Other Ambulatory Visit: Payer: Self-pay

## 2023-04-22 ENCOUNTER — Ambulatory Visit
Admission: RE | Admit: 2023-04-22 | Discharge: 2023-04-22 | Disposition: A | Discharge status: Home / Self Care | Payer: BC Managed Care – PPO | Source: Ambulatory Visit | Attending: Hematology and Oncology | Admitting: Hematology and Oncology

## 2023-04-22 DIAGNOSIS — C50411 Malignant neoplasm of upper-outer quadrant of right female breast: Secondary | ICD-10-CM

## 2023-04-25 ENCOUNTER — Encounter: Payer: Self-pay | Admitting: Hematology and Oncology

## 2023-04-25 ENCOUNTER — Inpatient Hospital Stay: Payer: BC Managed Care – PPO

## 2023-04-25 ENCOUNTER — Encounter: Payer: Self-pay | Admitting: *Deleted

## 2023-04-26 ENCOUNTER — Encounter: Payer: Self-pay | Admitting: Adult Health

## 2023-04-27 ENCOUNTER — Other Ambulatory Visit: Payer: Self-pay

## 2023-04-28 ENCOUNTER — Other Ambulatory Visit: Payer: Self-pay | Admitting: General Surgery

## 2023-04-28 NOTE — Progress Notes (Signed)
Surgical Instructions    Your procedure is scheduled on Tuesday May 03, 2023.  Report to [facility] Main Entrance "A" at 7:30 A.M., then check in with the Admitting office.  Call this number if you have problems the morning of surgery:  [phone]   If you have any questions prior to your surgery date call [phone]: Open Monday-Friday 8am-4pm If you experience any cold or flu symptoms such as cough, fever, chills, shortness of breath, etc. between now and your scheduled surgery, please notify us at the above number     Remember:  Do not eat after midnight the night before your surgery.  You may drink clear liquids until 6:30 the morning of your surgery.   Clear liquids allowed are: Water, Non-Citrus Juices (without pulp), Carbonated Beverages, Clear Tea, Black Coffee ONLY (NO MILK, CREAM OR POWDERED CREAMER of any kind), and Gatorade.    Take these medicines the morning of surgery with A SIP OF WATER:  diltiazem (CARDIZEM CD)  levothyroxine (SYNTHROID)  PULMICORT FLEXHALER : Please bring with you day of surgery.  If needed:  acetaminophen (TYLENOL)  albuterol Inhaler: Please bring with you day of surgery.  Follow your surgeon's instructions on when to stop Aspirin.  If no instructions were given by your surgeon then you will need to call the office to get those instructions.    As of today, STOP taking any (unless otherwise instructed by your surgeon) Aleve, Naproxen, Ibuprofen, Motrin, Advil, Goody's, BC's, all herbal medications, fish oil, and all vitamins.  Special instructions:    Oral Hygiene is also important to reduce your risk of infection.  Remember - BRUSH YOUR TEETH THE MORNING OF SURGERY WITH YOUR REGULAR TOOTHPASTE   [place]- Preparing For Surgery  Before surgery, you can play an important role. Because skin is not sterile, your skin needs to be as free of germs as possible. You can reduce the number of germs on your skin by washing with CHG  (chlorahexidine gluconate) Soap before surgery.  CHG is an antiseptic cleaner which kills germs and bonds with the skin to continue killing germs even after washing.     Please do not use if you have an allergy to CHG or antibacterial soaps. If your skin becomes reddened/irritated stop using the CHG.  Do not shave (including legs and underarms) for at least 48 hours prior to first CHG shower. It is OK to shave your face.  Please follow these instructions carefully.     Shower the NIGHT BEFORE SURGERY and the MORNING OF SURGERY with CHG Soap.   If you chose to wash your hair, wash your hair first as usual with your normal shampoo. After you shampoo, rinse your hair and body thoroughly to remove the shampoo.  Then [employer] and genitals (private parts) with your normal soap and rinse thoroughly to remove soap.  After that Use CHG Soap as you would any other liquid soap. You can apply CHG directly to the skin and wash gently with a scrungie or a clean washcloth.   Apply the CHG Soap to your body ONLY FROM THE NECK DOWN.  Do not use on open wounds or open sores. Avoid contact with your eyes, ears, mouth and genitals (private parts). Wash Face and genitals (private parts)  with your normal soap.   Wash thoroughly, paying special attention to the area where your surgery will be performed.  Thoroughly rinse your body with warm water from the neck down.  DO NOT shower/wash with  your normal soap after using and rinsing off the CHG Soap.  Pat yourself dry with a CLEAN TOWEL.  Wear CLEAN PAJAMAS to bed the night before surgery  Place CLEAN SHEETS on your bed the night before your surgery  DO NOT SLEEP WITH PETS.   Day of Surgery:  Take a shower with CHG soap. Wear Clean/Comfortable clothing the morning of surgery Do not apply any deodorants/lotions.   Remember to brush your teeth WITH YOUR REGULAR TOOTHPASTE.  Do not wear jewelry or makeup. Do not wear lotions, powders, perfumes/cologne  or deodorant. Do not shave 48 hours prior to surgery.  Men may shave face and neck. Do not bring valuables to the hospital. Do not wear nail polish, gel polish, artificial nails, or any other type of covering on natural nails (fingers and toes) If you have artificial nails or gel coating that need to be removed by a nail salon, please have this removed prior to surgery. Artificial nails or gel coating may interfere with anesthesia's ability to adequately monitor your vital signs.  [facility] is not responsible for any belongings or valuables.    Do NOT Smoke (Tobacco/Vaping)  24 hours prior to your procedure  If you use a CPAP at night, you may bring your mask for your overnight stay.   Contacts, glasses, hearing aids, dentures or partials may not be worn into surgery, please bring cases for these belongings   For patients admitted to the hospital, discharge time will be determined by your treatment team.   Patients discharged the day of surgery will not be allowed to drive home, and someone needs to stay with them for 24 hours.   SURGICAL WAITING ROOM VISITATION Patients having surgery or a procedure may have no more than 2 support people in the waiting area - these visitors may rotate.   Children under the age of 28 must have an adult with them who is not the patient. If the patient needs to stay at the hospital during part of their recovery, the visitor guidelines for inpatient rooms apply. Pre-op nurse will coordinate an appropriate time for 1 support person to accompany patient in pre-op.  This support person may not rotate.   Please refer to [email or web address] for the visitor guidelines for Inpatients (after your surgery is over and you are in a regular room).  If you received a COVID test during your pre-op visit, it is requested that you wear a mask when out in public, stay away from anyone that may not be feeling well, and notify  your surgeon if you develop symptoms. If you have been in contact with anyone that has tested positive in the last 10 days, please notify your surgeon.    Please read over the following fact sheets that you were given.

## 2023-04-29 ENCOUNTER — Encounter (HOSPITAL_COMMUNITY)
Admission: RE | Admit: 2023-04-29 | Discharge: 2023-04-29 | Disposition: A | Discharge status: Home / Self Care | Payer: BC Managed Care – PPO | Source: Ambulatory Visit | Attending: General Surgery | Admitting: General Surgery

## 2023-04-29 ENCOUNTER — Other Ambulatory Visit: Payer: Self-pay

## 2023-04-29 ENCOUNTER — Encounter (HOSPITAL_COMMUNITY): Payer: Self-pay

## 2023-04-29 DIAGNOSIS — Z01818 Encounter for other preprocedural examination: Secondary | ICD-10-CM | POA: Diagnosis not present

## 2023-04-29 HISTORY — DX: Other specified postprocedural states: Z98.890

## 2023-04-29 HISTORY — DX: Headache, unspecified: R51.9

## 2023-04-29 HISTORY — DX: Other specified postprocedural states: R11.2

## 2023-04-29 HISTORY — DX: Other complications of anesthesia, initial encounter: T88.59XA

## 2023-04-29 HISTORY — DX: Nausea with vomiting, unspecified: R11.2

## 2023-04-29 MED FILL — Dexamethasone Sodium Phosphate Inj 100 MG/10ML: INTRAMUSCULAR | Qty: 1 | Status: AC

## 2023-04-29 NOTE — Progress Notes (Signed)
Surgical Instructions    Your procedure is scheduled on Tuesday May 03, 2023.  Report to [facility] Main Entrance "A" at 7:30 A.M., then check in with the Admitting office.  Call this number if you have problems the morning of surgery:  [phone]   If you have any questions prior to your surgery date call [phone]: Open Monday-Friday 8am-4pm If you experience any cold or flu symptoms such as cough, fever, chills, shortness of breath, etc. between now and your scheduled surgery, please notify us at the above number     Remember:  Do not eat after midnight the night before your surgery.  You may drink clear liquids until 6:30 the morning of your surgery.   Clear liquids allowed are: Water, Non-Citrus Juices (without pulp), Carbonated Beverages, Clear Tea, Black Coffee ONLY (NO MILK, CREAM OR POWDERED CREAMER of any kind), and Gatorade.  Enhanced Recovery after Surgery for Orthopedics Enhanced Recovery after Surgery is a protocol used to improve the stress on your body and your recovery after surgery.  Patient Instructions  The day of surgery (if you do NOT have diabetes):  Drink ONE (1) Pre-Surgery Clear Ensure by __6:30 am the morning of surgery   This drink was given to you during your hospital  pre-op appointment visit. Nothing else to drink after completing the  Pre-Surgery Clear Ensure.         If you have questions, please contact your surgeon's office.    Take these medicines the morning of surgery with A SIP OF WATER:   diltiazem (CARDIZEM CD)  levothyroxine (SYNTHROID)  PULMICORT FLEXHALER : Please bring with you day of surgery.  If needed:  acetaminophen (TYLENOL)  albuterol Inhaler: Please bring with you day of surgery.  Follow your surgeon's instructions on when to stop Aspirin.  If no instructions were given by your surgeon then you will need to call the office to get those instructions.    As of today, STOP taking any (unless otherwise instructed by your  surgeon) Aleve, Naproxen, Ibuprofen, Motrin, Advil, Goody's, BC's, all herbal medications, fish oil, and all vitamins.  Special instructions:    Oral Hygiene is also important to reduce your risk of infection.  Remember - BRUSH YOUR TEETH THE MORNING OF SURGERY WITH YOUR REGULAR TOOTHPASTE   [facility]- Preparing For Surgery  Before surgery, you can play an important role. Because skin is not sterile, your skin needs to be as free of germs as possible. You can reduce the number of germs on your skin by washing with CHG (chlorahexidine gluconate) Soap before surgery.  CHG is an antiseptic cleaner which kills germs and bonds with the skin to continue killing germs even after washing.     Please do not use if you have an allergy to CHG or antibacterial soaps. If your skin becomes reddened/irritated stop using the CHG.  Do not shave (including legs and underarms) for at least 48 hours prior to first CHG shower. It is OK to shave your face.  Please follow these instructions carefully.     Shower the NIGHT BEFORE SURGERY and the MORNING OF SURGERY with CHG Soap.   If you chose to wash your hair, wash your hair first as usual with your normal shampoo. After you shampoo, rinse your hair and body thoroughly to remove the shampoo.  Then [employer] and genitals (private parts) with your normal soap and rinse thoroughly to remove soap.  After that Use CHG Soap as you would any other  liquid soap. You can apply CHG directly to the skin and wash gently with a scrungie or a clean washcloth.   Apply the CHG Soap to your body ONLY FROM THE NECK DOWN.  Do not use on open wounds or open sores. Avoid contact with your eyes, ears, mouth and genitals (private parts).  Wash thoroughly, paying special attention to the area where your surgery will be performed.  Thoroughly rinse your body with warm water from the neck down.  DO NOT shower/wash with your normal soap after using and rinsing off the CHG Soap.  Pat  yourself dry with a CLEAN TOWEL.  Wear CLEAN PAJAMAS to bed the night before surgery  Place CLEAN SHEETS on your bed the night before your surgery  DO NOT SLEEP WITH PETS.   Day of Surgery:  Take a shower with CHG soap. Wear Clean/Comfortable clothing the morning of surgery Do not apply any deodorants/lotions.   Remember to brush your teeth WITH YOUR REGULAR TOOTHPASTE.  Do not wear jewelry or makeup. Do not wear lotions, powders, perfumes/cologne or deodorant. Do not shave 48 hours prior to surgery.  Men may shave face and neck. Do not bring valuables to the hospital. Do not wear nail polish, gel polish, artificial nails, or any other type of covering on natural nails (fingers and toes) If you have artificial nails or gel coating that need to be removed by a nail salon, please have this removed prior to surgery. Artificial nails or gel coating may interfere with anesthesia's ability to adequately monitor your vital signs.  [facility] is not responsible for any belongings or valuables.    Do NOT Smoke (Tobacco/Vaping)  24 hours prior to your procedure  If you use a CPAP at night, you may bring your mask for your overnight stay.   Contacts, glasses, hearing aids, dentures or partials may not be worn into surgery, please bring cases for these belongings   For patients admitted to the hospital, discharge time will be determined by your treatment team.   Patients discharged the day of surgery will not be allowed to drive home, and someone needs to stay with them for 24 hours.   SURGICAL WAITING ROOM VISITATION Patients having surgery or a procedure may have no more than 2 support people in the waiting area - these visitors may rotate.   Children under the age of 69 must have an adult with them who is not the patient. If the patient needs to stay at the hospital during part of their recovery, the visitor guidelines for inpatient rooms apply. Pre-op nurse will coordinate an  appropriate time for 1 support person to accompany patient in pre-op.  This support person may not rotate.   Please refer to [email or web address] for the visitor guidelines for Inpatients (after your surgery is over and you are in a regular room).  If you received a COVID test during your pre-op visit, it is requested that you wear a mask when out in public, stay away from anyone that may not be feeling well, and notify your surgeon if you develop symptoms. If you have been in contact with anyone that has tested positive in the last 10 days, please notify your surgeon.    Please read over the following fact sheets that you were given.

## 2023-04-29 NOTE — Progress Notes (Signed)
PCP - Dr. Donzetta Sprung  Cardiologist - not current Mrs Paoletti saw  a cardiologist in 2012- he had afib, Patient has not been in Afib since 2012- and no longer sees Cardiologist  Pulm-no  Chest x-ray - 01/26/23  EKG - 10/13/22  Stress Test - no  ECHO - 11/15/22  Cardiac Cath - no  Nerve Stimulator-no  Sleep Study - no CPAP - no  LABS- CBC. CMP drawn 04/18/23  ASA-81 mg- was not given instructions, instructed to call surgeon's office.  ERAS- yes  HA1C-na GLP-1-na Fasting Blood Sugar - na Checks Blood Sugar _0____ times a day  Anesthesia-  Pt denies having chest pain, sob, or fever at this time. All instructions explained to the pt, with a verbal understanding of the material. Pt agrees to go over the instructions while at home for a better understanding. The opportunity to ask questions was provided.

## 2023-05-02 ENCOUNTER — Inpatient Hospital Stay: Payer: BC Managed Care – PPO

## 2023-05-02 ENCOUNTER — Inpatient Hospital Stay: Payer: BC Managed Care – PPO | Admitting: Adult Health

## 2023-05-02 ENCOUNTER — Ambulatory Visit: Payer: BC Managed Care – PPO

## 2023-05-02 NOTE — Anesthesia Preprocedure Evaluation (Signed)
Anesthesia Evaluation  Patient identified by MRN, date of birth, ID band Patient awake    Reviewed: Allergy & Precautions, NPO status , [name] Chart, lab work & pertinent test results  History of Anesthesia Complications (+) PONV and history of anesthetic complications  Airway Mallampati: II  TM Distance: >3 FB Neck ROM: Full    Dental no notable dental hx. (+) Dental Advisory Given   Pulmonary asthma    Pulmonary exam normal        Cardiovascular Normal cardiovascular exam+ dysrhythmias Atrial Fibrillation      Neuro/Psych negative neurological ROS  negative psych ROS   GI/Hepatic   Endo/Other  Hypothyroidism    Renal/GU negative Renal ROS      Musculoskeletal   Abdominal   Peds  Hematology   Anesthesia Other Findings All: PCN Breast CA  Reproductive/Obstetrics                             Anesthesia Physical Anesthesia Plan  ASA: 3  Anesthesia Plan: General   Post-op Pain Management: Tylenol PO (pre-op)* and Regional block*   Induction: Intravenous  PONV Risk Score and Plan: 4 or greater and Propofol infusion, TIVA, Treatment may vary due to age or medical condition, Ondansetron and Midazolam  Airway Management Planned: LMA  Additional Equipment: None  Intra-op Plan:   Post-operative Plan:   Informed Consent: I have reviewed the patients History and Physical, chart, labs and discussed the procedure including the risks, benefits and alternatives for the proposed anesthesia with the patient or authorized representative who has indicated his/her understanding and acceptance.     Dental advisory given  Plan Discussed with: Anesthesiologist and CRNA  Anesthesia Plan Comments: (LMA TIVA GA)       Anesthesia Quick Evaluation

## 2023-05-03 ENCOUNTER — Observation Stay (HOSPITAL_COMMUNITY)
Admission: RE | Admit: 2023-05-03 | Discharge: 2023-05-04 | Disposition: A | Discharge status: Home / Self Care | Payer: BC Managed Care – PPO | Source: Ambulatory Visit | Attending: General Surgery | Admitting: General Surgery

## 2023-05-03 ENCOUNTER — Encounter (HOSPITAL_COMMUNITY): Payer: Self-pay | Admitting: General Surgery

## 2023-05-03 ENCOUNTER — Encounter: Payer: Self-pay | Admitting: *Deleted

## 2023-05-03 ENCOUNTER — Other Ambulatory Visit: Payer: Self-pay

## 2023-05-03 ENCOUNTER — Ambulatory Visit (HOSPITAL_COMMUNITY): Payer: Self-pay | Admitting: Anesthesiology

## 2023-05-03 ENCOUNTER — Ambulatory Visit (HOSPITAL_COMMUNITY): Payer: BC Managed Care – PPO | Admitting: Anesthesiology

## 2023-05-03 ENCOUNTER — Encounter (HOSPITAL_COMMUNITY)
Admission: RE | Disposition: A | Discharge status: Home / Self Care | Payer: Self-pay | Source: Ambulatory Visit | Attending: General Surgery

## 2023-05-03 DIAGNOSIS — Z79899 Other long term (current) drug therapy: Secondary | ICD-10-CM | POA: Diagnosis not present

## 2023-05-03 DIAGNOSIS — J45909 Unspecified asthma, uncomplicated: Secondary | ICD-10-CM | POA: Diagnosis not present

## 2023-05-03 DIAGNOSIS — Z9011 Acquired absence of right breast and nipple: Secondary | ICD-10-CM

## 2023-05-03 DIAGNOSIS — Z7982 Long term (current) use of aspirin: Secondary | ICD-10-CM | POA: Insufficient documentation

## 2023-05-03 DIAGNOSIS — Z170 Estrogen receptor positive status [ER+]: Secondary | ICD-10-CM | POA: Insufficient documentation

## 2023-05-03 DIAGNOSIS — C50411 Malignant neoplasm of upper-outer quadrant of right female breast: Secondary | ICD-10-CM | POA: Diagnosis not present

## 2023-05-03 DIAGNOSIS — C50919 Malignant neoplasm of unspecified site of unspecified female breast: Principal | ICD-10-CM

## 2023-05-03 HISTORY — PX: PORT-A-CATH REMOVAL: SHX5289

## 2023-05-03 HISTORY — PX: SIMPLE MASTECTOMY WITH AXILLARY SENTINEL NODE BIOPSY: SHX6098

## 2023-05-03 SURGERY — SIMPLE MASTECTOMY
Anesthesia: General | Site: Chest | Laterality: Right

## 2023-05-03 MED ORDER — ACETAMINOPHEN 500 MG PO TABS
1000.0000 mg | ORAL_TABLET | Freq: Four times a day (QID) | ORAL | Status: DC
  Administered 2023-05-03 – 2023-05-04 (×2): 1000 mg via ORAL
  Filled 2023-05-03 (×3): qty 2

## 2023-05-03 MED ORDER — METHOCARBAMOL 500 MG PO TABS: 500.0000 mg | ORAL_TABLET | Freq: Three times a day (TID) | ORAL | Status: DC | PRN

## 2023-05-03 MED ORDER — FENTANYL CITRATE (PF) 100 MCG/2ML IJ SOLN
25.0000 ug | INTRAMUSCULAR | Status: DC | PRN
  Administered 2023-05-03: 25 ug via INTRAVENOUS

## 2023-05-03 MED ORDER — EPHEDRINE 5 MG/ML INJ
INTRAVENOUS | Status: AC
  Filled 2023-05-03: qty 5

## 2023-05-03 MED ORDER — BUDESONIDE 0.25 MG/2ML IN SUSP
0.2500 mg | Freq: Two times a day (BID) | RESPIRATORY_TRACT | Status: DC
  Administered 2023-05-03: 0.25 mg via RESPIRATORY_TRACT
  Filled 2023-05-03: qty 2

## 2023-05-03 MED ORDER — LACTATED RINGERS IV SOLN: INTRAVENOUS | Status: DC

## 2023-05-03 MED ORDER — FENTANYL CITRATE (PF) 100 MCG/2ML IJ SOLN
50.0000 ug | Freq: Once | INTRAMUSCULAR | Status: AC
  Administered 2023-05-03: 100 ug via INTRAVENOUS

## 2023-05-03 MED ORDER — BUPIVACAINE HCL (PF) 0.5 % IJ SOLN
INTRAMUSCULAR | Status: DC | PRN
  Administered 2023-05-03: 20 mL

## 2023-05-03 MED ORDER — AMISULPRIDE (ANTIEMETIC) 5 MG/2ML IV SOLN: 10.0000 mg | Freq: Once | INTRAVENOUS | Status: DC | PRN

## 2023-05-03 MED ORDER — MIDAZOLAM HCL 2 MG/2ML IJ SOLN
INTRAMUSCULAR | Status: AC
  Filled 2023-05-03: qty 2

## 2023-05-03 MED ORDER — FENTANYL CITRATE (PF) 250 MCG/5ML IJ SOLN
INTRAMUSCULAR | Status: AC
  Filled 2023-05-03: qty 5

## 2023-05-03 MED ORDER — HEMOSTATIC AGENTS (NO CHARGE) OPTIME
TOPICAL | Status: DC | PRN
  Administered 2023-05-03: 1 via TOPICAL

## 2023-05-03 MED ORDER — ROCURONIUM BROMIDE 10 MG/ML (PF) SYRINGE
PREFILLED_SYRINGE | INTRAVENOUS | Status: AC
  Filled 2023-05-03: qty 10

## 2023-05-03 MED ORDER — LIDOCAINE 2% (20 MG/ML) 5 ML SYRINGE
INTRAMUSCULAR | Status: AC
  Filled 2023-05-03: qty 5

## 2023-05-03 MED ORDER — EPHEDRINE SULFATE-NACL 50-0.9 MG/10ML-% IV SOSY
PREFILLED_SYRINGE | INTRAVENOUS | Status: DC | PRN
  Administered 2023-05-03: 2.5 mg via INTRAVENOUS
  Administered 2023-05-03: 5 mg via INTRAVENOUS

## 2023-05-03 MED ORDER — DEXAMETHASONE SODIUM PHOSPHATE 10 MG/ML IJ SOLN
INTRAMUSCULAR | Status: AC
  Filled 2023-05-03: qty 1

## 2023-05-03 MED ORDER — ACETAMINOPHEN 10 MG/ML IV SOLN: 1000.0000 mg | Freq: Once | INTRAVENOUS | Status: DC | PRN

## 2023-05-03 MED ORDER — OXYCODONE HCL 5 MG/5ML PO SOLN: 5.0000 mg | Freq: Once | ORAL | Status: DC | PRN

## 2023-05-03 MED ORDER — BUPIVACAINE-EPINEPHRINE (PF) 0.25% -1:200000 IJ SOLN
INTRAMUSCULAR | Status: AC
  Filled 2023-05-03: qty 30

## 2023-05-03 MED ORDER — CIPROFLOXACIN IN D5W 400 MG/200ML IV SOLN
400.0000 mg | INTRAVENOUS | Status: AC
  Administered 2023-05-03: 400 mg via INTRAVENOUS
  Filled 2023-05-03: qty 200

## 2023-05-03 MED ORDER — ONDANSETRON HCL 4 MG/2ML IJ SOLN: 4.0000 mg | Freq: Four times a day (QID) | INTRAMUSCULAR | Status: DC | PRN

## 2023-05-03 MED ORDER — FENTANYL CITRATE (PF) 100 MCG/2ML IJ SOLN
INTRAMUSCULAR | Status: AC
  Filled 2023-05-03: qty 2

## 2023-05-03 MED ORDER — LEVOTHYROXINE SODIUM 75 MCG PO TABS
75.0000 ug | ORAL_TABLET | Freq: Every day | ORAL | Status: DC
  Administered 2023-05-04: 75 ug via ORAL
  Filled 2023-05-03: qty 1

## 2023-05-03 MED ORDER — MORPHINE SULFATE (PF) 2 MG/ML IV SOLN: 2.0000 mg | INTRAVENOUS | Status: DC | PRN

## 2023-05-03 MED ORDER — PHENYLEPHRINE 80 MCG/ML (10ML) SYRINGE FOR IV PUSH (FOR BLOOD PRESSURE SUPPORT)
PREFILLED_SYRINGE | INTRAVENOUS | Status: DC | PRN
  Administered 2023-05-03: 80 ug via INTRAVENOUS

## 2023-05-03 MED ORDER — SCOPOLAMINE 1 MG/3DAYS TD PT72
1.0000 | MEDICATED_PATCH | TRANSDERMAL | Status: DC
  Administered 2023-05-03: 1.5 mg via TRANSDERMAL
  Filled 2023-05-03: qty 1

## 2023-05-03 MED ORDER — METHOCARBAMOL 1000 MG/10ML IJ SOLN: 500.0000 mg | Freq: Three times a day (TID) | INTRAVENOUS | Status: DC | PRN

## 2023-05-03 MED ORDER — OXYCODONE HCL 5 MG PO TABS: 5.0000 mg | ORAL_TABLET | Freq: Once | ORAL | Status: DC | PRN

## 2023-05-03 MED ORDER — ONDANSETRON HCL 4 MG/2ML IJ SOLN
INTRAMUSCULAR | Status: AC
  Filled 2023-05-03: qty 2

## 2023-05-03 MED ORDER — ENSURE PRE-SURGERY PO LIQD: 296.0000 mL | Freq: Once | ORAL | Status: DC

## 2023-05-03 MED ORDER — ALBUTEROL SULFATE HFA 108 (90 BASE) MCG/ACT IN AERS: 2.0000 | INHALATION_SPRAY | RESPIRATORY_TRACT | Status: DC | PRN

## 2023-05-03 MED ORDER — CHLORHEXIDINE GLUCONATE CLOTH 2 % EX PADS: 6.0000 | MEDICATED_PAD | Freq: Once | CUTANEOUS | Status: DC

## 2023-05-03 MED ORDER — DEXAMETHASONE SODIUM PHOSPHATE 10 MG/ML IJ SOLN
INTRAMUSCULAR | Status: DC | PRN
  Administered 2023-05-03: 10 mg via INTRAVENOUS

## 2023-05-03 MED ORDER — ONDANSETRON 4 MG PO TBDP: 4.0000 mg | ORAL_TABLET | Freq: Four times a day (QID) | ORAL | Status: DC | PRN

## 2023-05-03 MED ORDER — PHENYLEPHRINE HCL-NACL 20-0.9 MG/250ML-% IV SOLN
INTRAVENOUS | Status: DC | PRN
  Administered 2023-05-03: 20 ug/min via INTRAVENOUS

## 2023-05-03 MED ORDER — 0.9 % SODIUM CHLORIDE (POUR BTL) OPTIME
TOPICAL | Status: DC | PRN
  Administered 2023-05-03: 1000 mL

## 2023-05-03 MED ORDER — CHLORHEXIDINE GLUCONATE 0.12 % MT SOLN
OROMUCOSAL | Status: AC
  Administered 2023-05-03: 15 mL
  Filled 2023-05-03: qty 15

## 2023-05-03 MED ORDER — SODIUM CHLORIDE 0.9 % IV SOLN: INTRAVENOUS | Status: DC

## 2023-05-03 MED ORDER — BUPIVACAINE LIPOSOME 1.3 % IJ SUSP
INTRAMUSCULAR | Status: DC | PRN
Start: 2023-05-03 — End: 2023-05-03
  Administered 2023-05-03: 10 mL

## 2023-05-03 MED ORDER — DILTIAZEM HCL ER COATED BEADS 120 MG PO CP24
120.0000 mg | ORAL_CAPSULE | Freq: Every day | ORAL | Status: DC
  Administered 2023-05-03: 120 mg via ORAL
  Filled 2023-05-03 (×2): qty 1

## 2023-05-03 MED ORDER — ONDANSETRON HCL 4 MG/2ML IJ SOLN
INTRAMUSCULAR | Status: DC | PRN
  Administered 2023-05-03: 4 mg via INTRAVENOUS

## 2023-05-03 MED ORDER — OXYCODONE HCL 5 MG PO TABS: 5.0000 mg | ORAL_TABLET | ORAL | Status: DC | PRN

## 2023-05-03 MED ORDER — PHENYLEPHRINE 80 MCG/ML (10ML) SYRINGE FOR IV PUSH (FOR BLOOD PRESSURE SUPPORT)
PREFILLED_SYRINGE | INTRAVENOUS | Status: AC
  Filled 2023-05-03: qty 10

## 2023-05-03 MED ORDER — TRANEXAMIC ACID 1000 MG/10ML IV SOLN
INTRAVENOUS | Status: DC | PRN
  Administered 2023-05-03: 2000 mg via TOPICAL

## 2023-05-03 MED ORDER — PROPOFOL 10 MG/ML IV BOLUS
INTRAVENOUS | Status: DC | PRN
  Administered 2023-05-03: 10 mg via INTRAVENOUS
  Administered 2023-05-03: 125 mg via INTRAVENOUS

## 2023-05-03 MED ORDER — PROPOFOL 500 MG/50ML IV EMUL
INTRAVENOUS | Status: DC | PRN
Start: 2023-05-03 — End: 2023-05-03
  Administered 2023-05-03: 150 ug/kg/min via INTRAVENOUS

## 2023-05-03 MED ORDER — TRANEXAMIC ACID 1000 MG/10ML IV SOLN
2000.0000 mg | INTRAVENOUS | Status: DC
  Filled 2023-05-03: qty 20

## 2023-05-03 MED ORDER — ACETAMINOPHEN 500 MG PO TABS
1000.0000 mg | ORAL_TABLET | ORAL | Status: AC
  Administered 2023-05-03: 500 mg via ORAL
  Filled 2023-05-03: qty 2

## 2023-05-03 MED ORDER — MIDAZOLAM HCL 2 MG/2ML IJ SOLN: 2.0000 mg | Freq: Once | INTRAMUSCULAR | Status: DC

## 2023-05-03 MED ORDER — SIMETHICONE 80 MG PO CHEW: 40.0000 mg | CHEWABLE_TABLET | Freq: Four times a day (QID) | ORAL | Status: DC | PRN

## 2023-05-03 MED ORDER — ACETAMINOPHEN 500 MG PO TABS: 1000.0000 mg | ORAL_TABLET | Freq: Once | ORAL | Status: DC

## 2023-05-03 SURGICAL SUPPLY — 55 items
ADH SKN CLS APL DERMABOND .7 (GAUZE/BANDAGES/DRESSINGS) ×2
APL PRP STRL LF DISP 70% ISPRP (MISCELLANEOUS) ×2
APL PRP STRL LF ISPRP CHG 10.5 (MISCELLANEOUS)
APPLICATOR CHLORAPREP 10.5 ORG (MISCELLANEOUS) ×2 IMPLANT
APPLIER CLIP 9.375 MED OPEN (MISCELLANEOUS) ×4
APR CLP MED 9.3 20 MLT OPN (MISCELLANEOUS) ×4
BAG COUNTER SPONGE SURGICOUNT (BAG) ×2 IMPLANT
BAG SPNG CNTER NS LX DISP (BAG)
BINDER BREAST LRG (GAUZE/BANDAGES/DRESSINGS) IMPLANT
BINDER BREAST XLRG (GAUZE/BANDAGES/DRESSINGS) IMPLANT
BIOPATCH RED 1 DISK 7.0 (GAUZE/BANDAGES/DRESSINGS) ×2 IMPLANT
CHLORAPREP W/TINT 26 (MISCELLANEOUS) ×2 IMPLANT
CLIP APPLIE 9.375 MED OPEN (MISCELLANEOUS) ×2 IMPLANT
CLSR STERI-STRIP ANTIMIC 1/2X4 (GAUZE/BANDAGES/DRESSINGS) IMPLANT
COVER SURGICAL LIGHT HANDLE (MISCELLANEOUS) ×2 IMPLANT
DERMABOND ADVANCED .7 DNX12 (GAUZE/BANDAGES/DRESSINGS) ×2 IMPLANT
DRAIN CHANNEL 19F RND (DRAIN) ×2 IMPLANT
DRAPE LAPAROTOMY 100X72 PEDS (DRAPES) ×2 IMPLANT
DRAPE TOP ARMCOVERS (MISCELLANEOUS) ×2 IMPLANT
DRAPE U-SHAPE 76X120 STRL (DRAPES) ×2 IMPLANT
DRSG TEGADERM 4X4.75 (GAUZE/BANDAGES/DRESSINGS) ×2 IMPLANT
ELECT BLADE 4.0 EZ CLEAN MEGAD (MISCELLANEOUS) ×2
ELECT CAUTERY BLADE 6.4 (BLADE) ×2 IMPLANT
ELECT REM PT RETURN 9FT ADLT (ELECTROSURGICAL) ×2
ELECTRODE BLDE 4.0 EZ CLN MEGD (MISCELLANEOUS) ×2 IMPLANT
ELECTRODE REM PT RTRN 9FT ADLT (ELECTROSURGICAL) ×2 IMPLANT
EVACUATOR SILICONE 100CC (DRAIN) ×2 IMPLANT
GAUZE 4X4 16PLY ~~LOC~~+RFID DBL (SPONGE) ×2 IMPLANT
GAUZE PAD ABD 8X10 STRL (GAUZE/BANDAGES/DRESSINGS) ×4 IMPLANT
GLOVE BIO SURGEON STRL SZ7 (GLOVE) ×2 IMPLANT
GLOVE BIOGEL PI IND STRL 7.5 (GLOVE) ×2 IMPLANT
GOWN STRL REUS W/ TWL LRG LVL3 (GOWN DISPOSABLE) ×6 IMPLANT
GOWN STRL REUS W/TWL LRG LVL3 (GOWN DISPOSABLE) ×6
HEMOSTAT ARISTA ABSORB 3G PWDR (HEMOSTASIS) IMPLANT
KIT BASIN OR (CUSTOM PROCEDURE TRAY) ×2 IMPLANT
KIT TURNOVER KIT B (KITS) ×2 IMPLANT
NDL HYPO 25GX1X1/2 BEV (NEEDLE) ×2 IMPLANT
NEEDLE HYPO 25GX1X1/2 BEV (NEEDLE) ×2 IMPLANT
NS IRRIG 1000ML POUR BTL (IV SOLUTION) ×2 IMPLANT
PACK GENERAL/GYN (CUSTOM PROCEDURE TRAY) ×2 IMPLANT
PAD ARMBOARD 7.5X6 YLW CONV (MISCELLANEOUS) ×4 IMPLANT
PENCIL SMOKE EVACUATOR (MISCELLANEOUS) ×2 IMPLANT
SPIKE FLUID TRANSFER (MISCELLANEOUS) ×2 IMPLANT
STAPLER VISISTAT 35W (STAPLE) IMPLANT
STRIP CLOSURE SKIN 1/2X4 (GAUZE/BANDAGES/DRESSINGS) ×2 IMPLANT
SUT ETHILON 2 0 FS 18 (SUTURE) ×2 IMPLANT
SUT MNCRL AB 4-0 PS2 18 (SUTURE) ×2 IMPLANT
SUT SILK 2 0 SH (SUTURE) ×2 IMPLANT
SUT VIC AB 2-0 SH 18 (SUTURE) ×2 IMPLANT
SUT VIC AB 3-0 SH 18 (SUTURE) ×2 IMPLANT
SUT VIC AB 3-0 SH 27 (SUTURE) ×2
SUT VIC AB 3-0 SH 27X BRD (SUTURE) ×2 IMPLANT
SYR CONTROL 10ML LL (SYRINGE) ×2 IMPLANT
TOWEL GREEN STERILE (TOWEL DISPOSABLE) ×2 IMPLANT
TOWEL GREEN STERILE FF (TOWEL DISPOSABLE) ×2 IMPLANT

## 2023-05-03 NOTE — Discharge Instructions (Signed)
[facility] [phone]  MASTECTOMY: POST OP INSTRUCTIONS Take 400 mg of ibuprofen every 8 hours or 650 mg tylenol every 6 hours for next 72 hours then as needed. Use ice several times daily also. Always review your discharge instruction sheet given to you by the facility where your surgery was performed.  A prescription for pain medication may be given to you upon discharge.  Take your pain medication as prescribed, if needed.  If narcotic pain medicine is not needed, then you may take acetaminophen (Tylenol), naprosyn (Alleve) or ibuprofen (Advil) as needed. Take your usually prescribed medications unless otherwise directed. If you need a refill on your pain medication, please contact your pharmacy.  They will contact our office to request authorization.  Prescriptions will not be filled after 5pm or on week-ends. You should follow a light diet the first 24 hours after surgery.  Resume your normal diet the day after surgery. Most patients will experience some swelling and bruising on the chest and underarm.  Ice packs will help.  Swelling and bruising can take several days to resolve. Wear the binder or [place] bra for 72 hours day and night. After night please wear during the day.  It is common to experience some constipation if taking pain medication after surgery.  Increasing fluid intake and taking a stool softener (such as Colace) will usually help or prevent this problem from occurring.  A mild laxative (Milk of Magnesia or Miralax) should be taken according to package instructions if there are no bowel movements after 48 hours. There is glue and steristrips on your incision. They will come off in the next few weeks.  You may take a shower 48 hours after surgery.  Any sutures will be removed at an office visit DRAINS:  If you have drains in place, it is important to keep a list of the amount of drainage produced each day in your drains.  Before leaving the hospital, you  should be instructed on drain care.  Call our office if you have any questions about your drains. I will remove your drains when they put out less than 30 cc or ml for 2 consecutive days. ACTIVITIES:  You may resume regular (light) daily activities beginning the next day--such as daily self-care, walking, climbing stairs--gradually increasing activities as tolerated.  You may have sexual intercourse when it is comfortable.  Refrain from any heavy lifting or straining until approved by your doctor. You may drive when you are no longer taking prescription pain medication, you can comfortably wear a seatbelt, and you can safely maneuver your car and apply brakes. RETURN TO WORK:  __________________________________________________________ You should see your doctor in the office for a follow-up appointment approximately 3-5 days after your surgery.  Your doctor's nurse will typically make your follow-up appointment when she calls you with your pathology report.  Expect your pathology report 3-4business days after surgery. OTHER INSTRUCTIONS: ______________________________________________________________________________________________ ____________________________________________________________________________________________ WHEN TO CALL YOUR DR [place]: Fever over 101.0 Nausea and/or vomiting Extreme swelling or bruising Continued bleeding from incision. Increased pain, redness, or drainage from the incision. The clinic staff is available to answer your questions during regular business hours.  Please don't hesitate to call and ask to speak to one of the nurses for clinical concerns.  If you have a medical emergency, go to the nearest emergency room or call 911.  A surgeon from [facility] is always on call at the hospital. [address], [address], [place], [place]    [address] ? [address] [address], [place], [place]   [address] [phone] ? [phone] ? FAX [phone] Web site:  [email or web address]    About my Jackson-Pratt Bulb Drain  What is a Jackson-Pratt bulb? A Jackson-Pratt is a soft, round device used to collect drainage. It is connected to a long, thin drainage catheter, which is held in place by one or two small stiches near your surgical incision site. When the bulb is squeezed, it forms a vacuum, forcing the drainage to empty into the bulb.  Emptying the Jackson-Pratt bulb- To empty the bulb: 1. Release the plug on the top of the bulb. 2. Pour the bulb's contents into a measuring container which your nurse will provide. 3. Record the time emptied and amount of drainage. Empty the drain(s) as often as your     doctor or nurse recommends.  Date                  Time                    Amount (Drain 1)                 Amount (Drain 2)  _____________________________________________________________________  _____________________________________________________________________  _____________________________________________________________________  _____________________________________________________________________  _____________________________________________________________________  _____________________________________________________________________  _____________________________________________________________________  _____________________________________________________________________  Squeezing the Jackson-Pratt Bulb- To squeeze the bulb: 1. Make sure the plug at the top of the bulb is open. 2. Squeeze the bulb tightly in your fist. You will hear air squeezing from the bulb. 3. Replace the plug while the bulb is squeezed. 4. Use a safety pin to attach the bulb to your clothing. This will keep the catheter from     pulling at the bulb insertion site.  When to call your doctor- Call your doctor if: Drain site becomes red, swollen or hot. You have a fever greater than 101 degrees F. There is oozing at the drain  site. Drain falls out (apply a guaze bandage over the drain hole and secure it with tape). Drainage increases daily not related to activity patterns. (You will usually have more drainage when you are active than when you are resting.) Drainage has a bad odor.   

## 2023-05-03 NOTE — Op Note (Signed)
Preoperative diagnosis: Node positive breast cancer status postlumpectomy and chemotherapy Postoperative diagnosis: Same as above Procedure: 1.  Port removal 2.  Right mastectomy Surgeon: Dr. Harden Mo Anesthesia: General with a pectoral block Estimated blood loss: Minimal Drains: 19 French Blake drain to mastectomy space Specimens: Right breast tissue marked short superior and long axilla, some additional axillary tissue Complications: Sponge and count was correct completion Disposition recovery stable condition  Indications: This is a 67 year old female who I previously have done a right lumpectomy and sentinel node.  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  She had 5 sentinel lymph nodes that were positive at that time.  She underwent a couple of reexcision's that she continued to have positive margins.  I thought that the best choice was going to be a mastectomy.  She had no metastatic disease and due to the fact that this was going to delay chemotherapy we elected to proceed with chemotherapy.  She has done well with that but had some redness and swelling of her breast and we elected to proceed with mastectomy now.  Oncology stated I could remove her port as well.  Procedure: After for consent was obtained she first underwent a pectoral block.  Antibiotics were given.  SCDs were placed.  She was placed under general anesthesia without complication.  She was prepped and draped in standard sterile surgical fashion.  Surgical timeout was then performed.  I reentered her port incision.  The port and the line removed in their entirety.  I then sutured this with 3-0 Vicryl and 4 Monocryl.  Glue and Steri-Strips were applied.  I then made an elliptical incision in the nipple and areola with the inferior limb at the IM fold.  I then created flaps to the clavicle, parasternal area, anterior axillary line at the latissimus.  I  then remove the breast and the fascia from the pectoralis muscle without difficulty.  This was marked as above.  She did have some more palpable tissue in the axilla that I removed as well.  There certainly are no other abnormal lymph nodes that are far present as she and I discussed not doing any axillary lymph node dissection previously.  This was her preference.  I then placed a TXA soaked sponge for 5 minutes into the cavity.  This really controlled hemostasis well.  I then placed a 64 [place] Blake drain secured this with a 2-0 nylon suture.  Then closed down the breast tissue with 3-0 Vicryl and 4 Monocryl for the skin.  Glue and Steri-Strips were applied.  A surgical bra was placed and she was transferred recovery in stable condition.

## 2023-05-03 NOTE — Anesthesia Postprocedure Evaluation (Signed)
Anesthesia Post Note  Patient: Caitlin [name]  Procedure(s) Performed: RIGHT MASTECTOMY (Right) REMOVAL PORT-A-CATH     Patient location during evaluation: PACU Anesthesia Type: General Level of consciousness: sedated Pain management: pain level controlled Vital Signs Assessment: post-procedure vital signs reviewed and stable Respiratory status: spontaneous breathing and respiratory function stable Cardiovascular status: stable Postop Assessment: no apparent nausea or vomiting Anesthetic complications: no   There were no known notable events for this encounter.  Last Vitals:  Vitals:   05/03/23 1215 05/03/23 1245  BP: 123/71 133/74  Pulse: 63 65  Resp: 14 15  Temp: 36.5 C 36.4 C  SpO2: 99% 100%    Last Pain:  Vitals:   05/03/23 1245  TempSrc: Oral  PainSc:                  Ismelda Weatherman DANIEL

## 2023-05-03 NOTE — H&P (Addendum)
66 yof s/p right lump/sn. Doing well. Her pathology ended up being a 1.4 cm grade III IDC (inferior margin positive) that is 10% weak er pos, pr neg , her 2 negative. She had five sn that were positive. She has undergone staging scans now. Her bone scan is negative. Her ct c/a/p has no metastatic disease. There are some postop changes and prominent and some enlarged nodes on ct scan extending to right [place] region. There were no other abnormal nodes when I did her surgery. I have now re-excised her margins twice. The final time I re-excised two that were positive and another that felt abnormal that I removed. All three of these had additional cancer and two of the margins are still positive (the other is the muscle now). She has undergone chemotherapy (has two more taxol doses). She had redness and swelling of right breast show up. She otherwise is doing well  Medical History: Past Medical History:  Diagnosis Date  Arrhythmia  Arthritis  Asthma, unspecified asthma severity, unspecified whether complicated, unspecified whether persistent (HHS-HCC)   Patient Active Problem List  Diagnosis  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female, estrogen receptor positive (CMS/HHS-HCC)   Past Surgical History:  Procedure Laterality Date  APPENDECTOMY  DEEP AXILLARY SENTINEL NODE BIOPSY / EXCISION Right  MASTECTOMY PARTIAL / LUMPECTOMY Right    Allergies  Allergen Reactions  Penicillins Rash  REACTION: ?childhood reaction   Current Outpatient Medications on File Prior to Visit  Medication Sig Dispense Refill  acetaminophen (TYLENOL) 500 MG tablet Take by mouth  albuterol 90 mcg/actuation inhaler INHALE TWO PUFFS EVERY 4 HOURS AS NEEDED  aspirin 81 MG chewable tablet Take 81 mg by mouth once daily  benzonatate (TESSALON) 200 MG capsule TAKE ONE CAPSULE BY MOUTH THREE TIMES DAILY AS NEEDED FOR COUGH  dilTIAZem (CARDIZEM CD) 120 MG XR capsule Take 120 mg by mouth once daily   fluticasone propion-salmeteroL (ADVAIR HFA) 115-21 mcg/actuation inhaler Inhale into the lungs  levothyroxine (SYNTHROID) 75 MCG tablet Take 75 mcg by mouth once daily  tamsulosin (FLOMAX) 0.4 mg capsule Take by mouth   No current facility-administered medications on file prior to visit.   History reviewed. No pertinent family history.   Social History   Tobacco Use  Smoking Status Never  Smokeless Tobacco Never    Social History   Socioeconomic History  Marital status: Married  Tobacco Use  Smoking status: Never  Smokeless tobacco: Never  Substance and Sexual Activity  Alcohol use: Not Currently  Drug use: Not Currently   Social Determinants of Health   Physical Activity: Sufficiently Active (06/19/2020)  [facility], [facility]  Exercise Vital Sign  Days of Exercise per Week: 7 days  Minutes of Exercise per Session: 30 min   Objective:   Physical Exam   Right breast without mass, erythema overlying upper breast, ? Swelling, no ax adenopathy  Assessment and Plan:   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female, estrogen receptor positive (CMS/HHS-HCC)  Right mastectomy, port removal  She is doing well. She is getting mm/us today due to redness and swelling. Would be surprised if this is cancer. I will discuss with oncology. She needs mastectomy on right side. We could just proceed now and that might be reasonable. She wanted before to avoid nodal surgery but will await [place]. If there are abnormal nodes then will need that. Possibly needs surgery before next chemo or can discuss timing

## 2023-05-03 NOTE — Anesthesia Procedure Notes (Signed)
Anesthesia Regional Block: Pectoralis block   Pre-Anesthetic Checklist: , timeout performed,  Correct Patient, Correct Site, Correct Laterality,  Correct Procedure, Correct Position, site marked,  Risks and benefits discussed,  Surgical consent,  Pre-op evaluation,  At surgeon's request and post-op pain management  Laterality: Right  Prep: chloraprep       Needles:  Injection technique: Single-shot  Needle Type: Echogenic Stimulator Needle     Needle Length: 10cm  Needle Gauge: 21     Additional Needles:   Procedures:,,,, ultrasound used (permanent image in chart),,    Narrative:  Start time: 05/03/2023 8:32 AM End time: 05/03/2023 8:42 AM Injection made incrementally with aspirations every 5 mL.  Performed by: Personally

## 2023-05-03 NOTE — Transfer of Care (Signed)
Immediate Anesthesia Transfer of Care Note  Patient: Caitlin [name]  Procedure(s) Performed: RIGHT MASTECTOMY (Right) REMOVAL PORT-A-CATH  Patient Location: PACU  Anesthesia Type:GA combined with regional for post-op pain  Level of Consciousness: drowsy and patient cooperative  Airway & Oxygen Therapy: Patient Spontanous Breathing and Patient connected to face mask oxygen  Post-op Assessment: Report given to RN and Post -op Vital signs reviewed and stable  Post vital signs: Reviewed and stable  Last Vitals:  Vitals Value Taken Time  BP    Temp 36.5 C 05/03/23 1141  Pulse 67 05/03/23 1145  Resp 18 05/03/23 1145  SpO2 100 % 05/03/23 1145  Vitals shown include unfiled device data.  Last Pain:  Vitals:   05/03/23 1141  TempSrc:   PainSc: Asleep         Complications: There were no known notable events for this encounter.

## 2023-05-03 NOTE — Interval H&P Note (Signed)
History and Physical Interval Note:  05/03/2023 8:26 AM  Caitlin [name]  has presented today for surgery, with the diagnosis of RIGHT BREAST CANCER.  The various methods of treatment have been discussed with the patient and family. After consideration of risks, benefits and other options for treatment, the patient has consented to  Procedure(s) with comments: RIGHT MASTECTOMY (Right) - GEN & PEC BLOCK REMOVAL PORT-A-CATH (N/A) as a surgical intervention.  The patient's history has been reviewed, patient examined, no change in status, stable for surgery.  I have reviewed the patient's chart and labs.  Questions were answered to the patient's satisfaction.     [name]

## 2023-05-03 NOTE — H&P (View-Only) (Signed)
 66 yof s/p right lump/sn. Doing well. Her pathology ended up being a 1.4 cm grade III IDC (inferior margin positive) that is 10% weak er pos, pr neg , her 2 negative. She had five sn that were positive. She has undergone staging scans now. Her bone scan is negative. Her ct c/a/p has no metastatic disease. There are some postop changes and prominent and some enlarged nodes on ct scan extending to right [place] region. There were no other abnormal nodes when I did her surgery. I have now re-excised her margins twice. The final time I re-excised two that were positive and another that felt abnormal that I removed. All three of these had additional cancer and two of the margins are still positive (the other is the muscle now). She has undergone chemotherapy (has two more taxol doses). She had redness and swelling of right breast show up. She otherwise is doing well  Medical History: Past Medical History:  Diagnosis Date  Arrhythmia  Arthritis  Asthma, unspecified asthma severity, unspecified whether complicated, unspecified whether persistent (HHS-HCC)   Patient Active Problem List  Diagnosis  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female, estrogen receptor positive (CMS/HHS-HCC)   Past Surgical History:  Procedure Laterality Date  APPENDECTOMY  DEEP AXILLARY SENTINEL NODE BIOPSY / EXCISION Right  MASTECTOMY PARTIAL / LUMPECTOMY Right    Allergies  Allergen Reactions  Penicillins Rash  REACTION: ?childhood reaction   Current Outpatient Medications on File Prior to Visit  Medication Sig Dispense Refill  acetaminophen (TYLENOL) 500 MG tablet Take by mouth  albuterol 90 mcg/actuation inhaler INHALE TWO PUFFS EVERY 4 HOURS AS NEEDED  aspirin 81 MG chewable tablet Take 81 mg by mouth once daily  benzonatate (TESSALON) 200 MG capsule TAKE ONE CAPSULE BY MOUTH THREE TIMES DAILY AS NEEDED FOR COUGH  dilTIAZem (CARDIZEM CD) 120 MG XR capsule Take 120 mg by mouth once daily   fluticasone propion-salmeteroL (ADVAIR HFA) 115-21 mcg/actuation inhaler Inhale into the lungs  levothyroxine (SYNTHROID) 75 MCG tablet Take 75 mcg by mouth once daily  tamsulosin (FLOMAX) 0.4 mg capsule Take by mouth   No current facility-administered medications on file prior to visit.   History reviewed. No pertinent family history.   Social History   Tobacco Use  Smoking Status Never  Smokeless Tobacco Never    Social History   Socioeconomic History  Marital status: Married  Tobacco Use  Smoking status: Never  Smokeless tobacco: Never  Substance and Sexual Activity  Alcohol use: Not Currently  Drug use: Not Currently   Social Determinants of Health   Physical Activity: Sufficiently Active (06/19/2020)  Received from [facility], [facility]  Exercise Vital Sign  Days of Exercise per Week: 7 days  Minutes of Exercise per Session: 30 min   Objective:   Physical Exam   Right breast without mass, erythema overlying upper breast, ? Swelling, no ax adenopathy  Assessment and Plan:   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female, estrogen receptor positive (CMS/HHS-HCC)  Right mastectomy, port removal  She is doing well. She is getting mm/us today due to redness and swelling. Would be surprised if this is cancer. I will discuss with oncology. She needs mastectomy on right side. We could just proceed now and that might be reasonable. She wanted before to avoid nodal surgery but will await [place]. If there are abnormal nodes then will need that. Possibly needs surgery before next chemo or can discuss timing

## 2023-05-03 NOTE — Anesthesia Procedure Notes (Signed)
Procedure Name: LMA Insertion Date/Time: 05/03/2023 10:12 AM  Performed by: Rachel Moulds, CRNAPre-anesthesia Checklist: Patient identified, Emergency Drugs available, Suction available and Patient being monitored Patient Re-evaluated:Patient Re-evaluated prior to induction Oxygen Delivery Method: Circle System Utilized Preoxygenation: Pre-oxygenation with 100% oxygen Induction Type: IV induction LMA: LMA inserted LMA Size: 4.0 Number of attempts: 1 Airway Equipment and Method: Bite block Placement Confirmation: positive ETCO2 Tube secured with: Tape Dental Injury: Teeth and Oropharynx as per pre-operative assessment

## 2023-05-04 ENCOUNTER — Encounter (HOSPITAL_COMMUNITY): Payer: Self-pay | Admitting: General Surgery

## 2023-05-04 ENCOUNTER — Encounter: Payer: Self-pay | Admitting: Hematology and Oncology

## 2023-05-04 DIAGNOSIS — C50411 Malignant neoplasm of upper-outer quadrant of right female breast: Secondary | ICD-10-CM | POA: Diagnosis not present

## 2023-05-04 MED ORDER — OXYCODONE HCL 5 MG PO TABS: 5.0000 mg | ORAL_TABLET | ORAL | 0 refills | Status: DC | PRN

## 2023-05-04 NOTE — Progress Notes (Signed)
This encounter was created in error - please disregard.

## 2023-05-04 NOTE — Discharge Summary (Signed)
Physician Discharge Summary  Patient ID: Caitlin [name] MRN: [id number] DOB/AGE: [birth year] 67 y.o.  Admit date: 05/03/2023 Discharge date: 05/04/2023  Admission Diagnoses: Breast cancer  Discharge Diagnoses:  Principal Problem:   Breast CA (HCC) Active Problems:   S/P mastectomy, right   Discharged Condition: good  Hospital Course: 67 yof s/p right mastectomy, port removal doing well ready for dc  Consults: None  Significant Diagnostic Studies: none  Treatments: surgery: right mastectomy, port removal  Discharge Exam: Blood pressure 98/66, pulse 70, temperature 98.2 F (36.8 C), temperature source Oral, resp. rate 17, height 5\' 3"  (1.6 m), weight 67 kg, SpO2 98%. Drain as expected, no hematoma  Disposition: Discharge disposition: 01-Home or Self Care        Allergies as of 05/04/2023       Reactions   Penicillins Rash   childhood reaction        Medication List     TAKE these medications    acetaminophen 500 MG tablet Commonly known as: TYLENOL Take 500 mg by mouth every 6 (six) hours as needed for moderate pain.   albuterol 108 (90 Base) MCG/ACT inhaler Commonly known as: VENTOLIN HFA Inhale 2 puffs into the lungs every 4 (four) hours as needed for shortness of breath (Asthma).   aspirin 81 MG tablet Take 81 mg by mouth daily.   benzonatate 200 MG capsule Commonly known as: TESSALON Take 200 mg by mouth daily as needed for cough.   diltiazem 120 MG 24 hr capsule Commonly known as: CARDIZEM CD Take 120 mg by mouth daily.   levothyroxine 75 MCG tablet Commonly known as: SYNTHROID Take 75 mcg by mouth daily before breakfast.   lidocaine-prilocaine cream Commonly known as: EMLA Apply to affected area once What changed:  how much to take how to take this when to take this reasons to take this additional instructions   multivitamin with minerals tablet Take 1 tablet by mouth daily.   OVER THE COUNTER MEDICATION Take 1 capsule by  mouth 2 (two) times daily. copaiba softgels   oxyCODONE 5 MG immediate release tablet Commonly known as: Oxy IR/ROXICODONE Take 1 tablet (5 mg total) by mouth every 4 (four) hours as needed for moderate pain.   Pulmicort Flexhaler 180 MCG/ACT inhaler Generic drug: budesonide Inhale 1 puff into the lungs daily.   triamcinolone cream 0.1 % Commonly known as: KENALOG Apply 1 Application topically daily as needed (Dry skin).        Follow-up Information     [name], MD Follow up in 2 week(s).   Specialty: General Surgery Contact information: [address] [place] [address] [phone]                 Signed: [name] 05/04/2023, 7:12 AM

## 2023-05-04 NOTE — Progress Notes (Signed)
RN gave patient DC instructions and the patient stated understanding. Drain care has been taught and IV has been removed, Oxycodone has been escribed to patients home pharmacy. Patient waiting for her breathing treatment this AM and her daughter to arrive.

## 2023-05-05 ENCOUNTER — Encounter: Payer: Self-pay | Admitting: Radiation Oncology

## 2023-05-05 ENCOUNTER — Telehealth: Payer: Self-pay | Admitting: Radiation Oncology

## 2023-05-05 ENCOUNTER — Other Ambulatory Visit: Payer: Self-pay

## 2023-05-05 DIAGNOSIS — C50411 Malignant neoplasm of upper-outer quadrant of right female breast: Secondary | ICD-10-CM

## 2023-05-05 NOTE — Telephone Encounter (Signed)
8/1 @ 2:12 pm received call from Darryl Nestle, patient decided to have all her follow up appointments and care done in [place], [place] -per Dr. Roselind Messier.  All appointments has been canceled as requested.

## 2023-05-06 ENCOUNTER — Telehealth: Payer: Self-pay | Admitting: *Deleted

## 2023-05-06 ENCOUNTER — Encounter: Payer: Self-pay | Admitting: Hematology and Oncology

## 2023-05-06 NOTE — Telephone Encounter (Signed)
Received phone call from [facility] and patient has an appt. for consultation  on  06/01/23 @ 10:30 am

## 2023-05-09 ENCOUNTER — Telehealth: Payer: Self-pay | Admitting: *Deleted

## 2023-05-09 ENCOUNTER — Encounter: Payer: Self-pay | Admitting: *Deleted

## 2023-05-09 DIAGNOSIS — Z170 Estrogen receptor positive status [ER+]: Secondary | ICD-10-CM

## 2023-05-09 NOTE — Telephone Encounter (Signed)
Pt scheduled to see Dr. Langston Masker on 06/01/23 for xrt.

## 2023-05-10 ENCOUNTER — Encounter: Payer: Self-pay | Admitting: Hematology and Oncology

## 2023-05-10 ENCOUNTER — Other Ambulatory Visit: Payer: Self-pay

## 2023-05-10 ENCOUNTER — Telehealth: Payer: Self-pay | Admitting: Radiation Oncology

## 2023-05-10 ENCOUNTER — Encounter: Payer: Self-pay | Admitting: *Deleted

## 2023-05-10 ENCOUNTER — Inpatient Hospital Stay: Payer: BC Managed Care – PPO | Attending: Hematology and Oncology | Admitting: Hematology and Oncology

## 2023-05-10 ENCOUNTER — Telehealth: Payer: Self-pay

## 2023-05-10 VITALS — BP 147/86 | HR 85 | Temp 97.5°F | Resp 18 | Ht 63.0 in | Wt 145.3 lb

## 2023-05-10 DIAGNOSIS — Z170 Estrogen receptor positive status [ER+]: Secondary | ICD-10-CM | POA: Diagnosis not present

## 2023-05-10 DIAGNOSIS — Z79620 Long term (current) use of immunosuppressive biologic: Secondary | ICD-10-CM | POA: Insufficient documentation

## 2023-05-10 DIAGNOSIS — C50411 Malignant neoplasm of upper-outer quadrant of right female breast: Secondary | ICD-10-CM | POA: Insufficient documentation

## 2023-05-10 DIAGNOSIS — C773 Secondary and unspecified malignant neoplasm of axilla and upper limb lymph nodes: Secondary | ICD-10-CM | POA: Insufficient documentation

## 2023-05-10 DIAGNOSIS — Z9221 Personal history of antineoplastic chemotherapy: Secondary | ICD-10-CM | POA: Insufficient documentation

## 2023-05-10 DIAGNOSIS — Z79899 Other long term (current) drug therapy: Secondary | ICD-10-CM | POA: Insufficient documentation

## 2023-05-10 DIAGNOSIS — Z171 Estrogen receptor negative status [ER-]: Secondary | ICD-10-CM | POA: Insufficient documentation

## 2023-05-10 DIAGNOSIS — Z5112 Encounter for antineoplastic immunotherapy: Secondary | ICD-10-CM | POA: Insufficient documentation

## 2023-05-10 DIAGNOSIS — Z9011 Acquired absence of right breast and nipple: Secondary | ICD-10-CM | POA: Diagnosis not present

## 2023-05-10 NOTE — Research (Signed)
A Randomized, Open-label, Phase 3 Study of Adjuvant Sacituzumab Govitecan and Pembrolizumab Versus Treatment of Physician's Choice in Patients With Triple Negative Breast Cancer Who Have Residual Invasive Disease After Surgery and Neoadjuvant Therapy  Study Acronym: ASCENT-05  Patient Caitlin [name] was identified by Dr. Pamelia Hoit as a potential candidate for the above listed study.  This Clinical Research Nurse met with Caitlin [name], [id number], on 05/10/23 in a manner and location that ensures patient privacy to discuss participation in the above listed research study.  Patient is Accompanied by her husband .  A copy of the informed consent document with embedded HIPAA language was provided to the patient.  Patient reads, speaks, and understands [place].   Patient was provided with the business card of this Nurse and encouraged to contact the research team with any questions.  Approximately 15 minutes were spent with the patient reviewing the informed consent documents.  Patient was provided the option of taking informed consent documents home to review and was encouraged to review at their convenience with their support network, including other care providers. Patient took the consent documents home to review. The research nurse informed the pt there are several eligibility criteria that must be reviewed before she can qualify for the study.  The research nurse will begin the eligibility review process.  The pt was encouraged to read the 39 page consent form and contact the research nurse if she has any questions.  Janan Ridge RN, BSN, CCRP Clinical Research Nurse Lead 05/10/2023 1:54 PM

## 2023-05-10 NOTE — Assessment & Plan Note (Addendum)
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  No distant metastatic disease. Bone scan 10/29/2022: No evidence of bone metastasis   11/08/2022 and 11/18/2022: Margin reexcision surgeries: Final margin focal positivity.  After much discussion the plan is to finish her systemic treatment and come back at a later point to do mastectomy.   Treatment plan: Adjuvant chemotherapy with dose dense Adriamycin and Cytoxan followed by Taxol to be completed 05/02/2023 Mastectomy by Dr. Dwain Sarna 05/03/2023:Right mastectomy: 8 cm grade 3 IDC with extensive lymphatic and vascular invasion RCB class III, deep margin multifocally positive, 5/5 lymph nodes positive, ER weak 10%, PR 0%, HER2 0, Ki-67 30%, total lymph nodes: 10/10 (prior lumpectomy she had 5 positive nodes) Adjuvant radiation Very little to mild benefit from antiestrogen therapy and therefore we decided that she would not need to take it. ------------------------------------------------------------------------------------------------------------------------------------------------ Treatment plan: Based upon extensive amount of residual breast cancer following neoadjuvant chemotherapy with Adriamycin Cytoxan and Taxol, I recommend adjuvant Xeloda   CREATE-X study enrolled 910 patient's with triple negative breast cancer and residual disease after neo-adjuvant therapy. 455 patients assigned to 1250 mg/m times daily 2 weeks  on 1 week off  up to 8 cycles, at 5 years PFS 74.1% with Xeloda compared to 67.7% in control arm, 30% reduction in risk, overall survival 89.2% versus 83.9%, hand-foot syndrome was seen in 72.3% with Xeloda grade 3 in 10.9%  We did also discussed the pros and cons of adjuvant immunotherapy with Keytruda. I recommended another round of CT and bone scans

## 2023-05-10 NOTE — Telephone Encounter (Signed)
Per Caitlin [name] patient has decided to further her care at our facility instead of [facility], spoke to patient to schedule FUN visit w. Dr. Roselind Messier.

## 2023-05-10 NOTE — Telephone Encounter (Signed)
Per MyChart message, pt accepted appt with MD for 1145 today. Appt added and pt aware.

## 2023-05-10 NOTE — Progress Notes (Signed)
Patient Care Team: [name], MD as PCP - General (Unknown Physician Specialty) Pershing Proud, RN as Oncology Nurse Navigator Donnelly Angelica, RN as Oncology Nurse Navigator [name], MD as Consulting Physician (Hematology and Oncology) Antony Blackbird, MD as Consulting Physician (Radiation Oncology) [name], MD as Consulting Physician (General Surgery)  DIAGNOSIS:  Encounter Diagnosis  Name Primary?   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female, estrogen receptor positive (HCC) Yes    SUMMARY OF ONCOLOGIC HISTORY: Oncology History  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female, estrogen receptor positive (HCC)  09/15/2022 Initial Diagnosis   Screening mammogram detected right breast mass which measured 1.4 cm by ultrasound at 10 o'clock position, biopsy revealed grade 3 IDC with lymphovascular invasion ER 10%, PR 0%, Ki-67 30%, HER2 0 negative   09/25/2022 Cancer Staging   Staging form: Breast, AJCC 8th Edition - Clinical: Stage IB (cT1c, cN0, cM0, G3, ER+, PR-, HER2-) - Signed by [name], MD on 09/25/2022 Histologic grading system: 3 grade system    Genetic Testing   [facility] CancerNext-Expanded Panel+RNA was Negative. Report date is 10/06/2021.  The CancerNext-Expanded gene panel offered by [facility] and includes sequencing, rearrangement, and RNA analysis for the following 77 genes: AIP, ALK, APC, ATM, AXIN2, BAP1, BARD1, BLM, BMPR1A, BRCA1, BRCA2, BRIP1, CDC73, CDH1, CDK4, CDKN1B, CDKN2A, CHEK2, CTNNA1, DICER1, FANCC, FH, FLCN, GALNT12, KIF1B, LZTR1, MAX, MEN1, MET, MLH1, MSH2, MSH3, MSH6, MUTYH, NBN, NF1, NF2, NTHL1, PALB2, PHOX2B, PMS2, POT1, PRKAR1A, PTCH1, PTEN, RAD51C, RAD51D, RB1, RECQL, RET, SDHA, SDHAF2, SDHB, SDHC, SDHD, SMAD4, SMARCA4, SMARCB1, SMARCE1, STK11, SUFU, TMEM127, TP53, TSC1, TSC2, VHL and XRCC2 (sequencing and deletion/duplication); EGFR, EGLN1, HOXB13, KIT, MITF, PDGFRA, POLD1, and  POLE (sequencing only); EPCAM and GREM1 (deletion/duplication only).    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 with high-grade DCIS, inferior margin positive, angiolymphatic invasion present, 5/5 lymph nodes positive, additional medial margin: Poorly differentiated carcinoma with lymphoid stroma, additional inferior margin: Positive, ER 10% weak, PR 0%, HER2 0, Ki-67 30%   10/21/2022 Cancer Staging   Staging form: Breast, AJCC 8th Edition - Pathologic: Stage IIIC (pT1c, pN2a, cM0, G3, ER-, PR-, HER2-) - Signed by [name], MD on 10/21/2022 Stage prefix: Initial diagnosis Histologic grading system: 3 grade system   11/08/2022 Surgery   Margin reexcision: Inferior margin: Grade 3 IDC, new margin positive Medial margin: Grade 3 IDC new margin widely involved   11/18/2022 Surgery   Right inferior margin reexcision: Invasive poorly differentiated adenocarcinoma grade 3 present in new inferior margin.  Medial margin reexcision: Invasive poorly differentiated adenocarcinoma grade 3 present at new medial margin, posterior margin: Poorly differentiated adenocarcinoma grade 3, margin negative  11/08/2022 and 11/18/2022: Final margin focal positivity. After much discussion the plan is to finish her systemic treatment and come back at a later point to do mastectomy.    12/02/2022 -  Chemotherapy   Patient is on Treatment Plan : BREAST ADJUVANT DOSE DENSE AC q14d / PACLitaxel q7d     05/03/2023 Surgery   Right mastectomy: 8 cm grade 3 IDC with extensive lymphatic and vascular invasion RCB class III, deep margin multifocally positive, 5/5 lymph nodes positive, ER weak 10%, PR 0%, HER2 0, Ki-67 30%     CHIEF COMPLIANT: Follow-up after right mastectomy  INTERVAL HISTORY: Caitlin [name] is a  67 y.o. female is here because of above-mentioned history of breast  cancer who underwent initially lumpectomy that  revealed extensive positive margins and residual disease for triple negative breast cancer, she underwent neoadjuvant chemotherapy with dose dense Adriamycin and Cytoxan followed by Taxol.  Subsequent mastectomy showed a large amount of residual disease measuring 8 cm.  Completion axillary lymph node dissection was performed which revealed additional 5 lymph nodes making 10 out of 10 positive lymph nodes.  Final pathology showed weak ER 10% (functional triple negative) she tolerated surgery extremely well.  She is here today accompanied by her husband to discuss adjuvant treatment plan.   ALLERGIES:  is allergic to penicillins.  MEDICATIONS:  Current Outpatient Medications  Medication Sig Dispense Refill   acetaminophen (TYLENOL) 500 MG tablet Take 500 mg by mouth every 6 (six) hours as needed for moderate pain.     albuterol (VENTOLIN HFA) 108 (90 Base) MCG/ACT inhaler Inhale 2 puffs into the lungs every 4 (four) hours as needed for shortness of breath (Asthma).     aspirin 81 MG tablet Take 81 mg by mouth daily.       benzonatate (TESSALON) 200 MG capsule Take 200 mg by mouth daily as needed for cough.     diltiazem (CARDIZEM CD) 120 MG 24 hr capsule Take 120 mg by mouth daily.     levothyroxine (SYNTHROID) 75 MCG tablet Take 75 mcg by mouth daily before breakfast.     lidocaine-prilocaine (EMLA) cream Apply to affected area once (Patient taking differently: Apply 1 Application topically daily as needed (port access).) 30 g 3   Multiple Vitamins-Minerals (MULTIVITAMIN WITH MINERALS) tablet Take 1 tablet by mouth daily.     OVER THE COUNTER MEDICATION Take 1 capsule by mouth 2 (two) times daily. copaiba softgels     oxyCODONE (OXY IR/ROXICODONE) 5 MG immediate release tablet Take 1 tablet (5 mg total) by mouth every 4 (four) hours as needed for moderate pain. 10 tablet 0   PULMICORT FLEXHALER 180 MCG/ACT inhaler Inhale 1 puff into the lungs daily.     triamcinolone cream (KENALOG) 0.1 % Apply 1  Application topically daily as needed (Dry skin).     No current facility-administered medications for this visit.    PHYSICAL EXAMINATION: ECOG PERFORMANCE STATUS: 1 - Symptomatic but completely ambulatory  Vitals:   05/10/23 1149  BP: (!) 147/86  Pulse: 85  Resp: 18  Temp: (!) 97.5 F (36.4 C)  SpO2: 98%   Filed Weights   05/10/23 1149  Weight: 145 lb 4.8 oz (65.9 kg)      LABORATORY DATA:  I have reviewed the data as listed    Latest Ref Rng & Units 04/18/2023    7:32 AM 04/11/2023    7:40 AM 04/04/2023    8:10 AM  CMP  Glucose 70 - 99 mg/dL 89  83  87   BUN 8 - 23 mg/dL 14  14  14    Creatinine 0.44 - 1.00 mg/dL 1.61  0.96  0.45   Sodium 135 - 145 mmol/L 140  140  139   Potassium 3.5 - 5.1 mmol/L 3.7  3.8  3.8   Chloride 98 - 111 mmol/L 107  107  106   CO2 22 - 32 mmol/L 25  26  26    Calcium 8.9 - 10.3 mg/dL 9.1  9.5  8.8   Total Protein 6.5 - 8.1 g/dL 6.5  6.7  6.7   Total Bilirubin 0.3 - 1.2 mg/dL 0.4  0.3  0.4   Alkaline Phos 38 - 126 U/L 63  78  78   AST 15 - 41 U/L 15  15  15    ALT 0 - 44 U/L 11  11  12      Lab Results  Component Value Date   WBC 3.1 (L) 04/18/2023   HGB 11.2 (L) 04/18/2023   HCT 33.7 (L) 04/18/2023   MCV 96.8 04/18/2023   PLT 271 04/18/2023   NEUTROABS 1.9 04/18/2023    ASSESSMENT & PLAN:  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female, estrogen receptor positive (HCC) 10/14/2022:Right lumpectomy: 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  No distant metastatic disease. Bone scan 10/29/2022: No evidence of bone metastasis   11/08/2022 and 11/18/2022: Margin reexcision surgeries: Final margin focal positivity.  After much discussion the plan is to finish her systemic treatment and come back at a later point to do mastectomy.   Treatment plan: Adjuvant chemotherapy with dose dense Adriamycin and Cytoxan followed by Taxol to be completed 05/02/2023 Mastectomy by Dr. Dwain Sarna 05/03/2023:Right mastectomy: 8 cm grade 3 IDC with extensive lymphatic and vascular invasion RCB class III, deep margin multifocally positive, 5/5 lymph nodes positive, ER weak 10%, PR 0%, HER2 0, Ki-67 30%, total lymph nodes: 10/10 (prior lumpectomy she had 5 positive nodes) Adjuvant radiation Very little to mild benefit from antiestrogen therapy and therefore we decided that she would not need to take it. ------------------------------------------------------------------------------------------------------------------------------------------------ Treatment plan: Based upon extensive amount of residual breast cancer following neoadjuvant chemotherapy with Adriamycin Cytoxan and Taxol, I recommended Keytruda with capecitabine. I also discussed if she is interested in participating in a clinical trial called ASCENT 05 which randomizes patients between [place] Xeloda versus Cher Nakai.  I went over the risks and benefits of each of these drugs in great detail. I will request Dr. Harden Mo to place the port. I will request Dr. Arnette Schaumann to see her for adjuvant radiation. We are able to give her at least 2 doses of Keytruda before randomization on the clinical trial.   I recommended another round of CT scans. Return to clinic to start immunotherapy as soon as the port is in place.  Orders Placed This Encounter  Procedures   CT CHEST ABDOMEN PELVIS W CONTRAST    Standing Status:   Future    Standing Expiration  Date:   05/09/2024    Order Specific Question:   If indicated for the ordered procedure, I authorize the administration of contrast media per Radiology protocol    Answer:   Yes    Order Specific Question:   Does the patient have a contrast media/X-ray dye allergy?    Answer:   No    Order Specific Question:   Preferred imaging location?    Answer:   [facility]    Order Specific Question:   Release to patient    Answer:   Immediate    Order Specific Question:   If indicated for the ordered procedure, I authorize the administration of oral contrast media per Radiology protocol    Answer:   Yes   The patient  has a good understanding of the overall plan. she agrees with it. she will call with any problems that may develop before the next visit here. Total time spent: 45 mins including face to face time and time spent for planning, charting and co-ordination of care   Tamsen Meek, MD 05/10/23    I Janan Ridge am acting as a Neurosurgeon for [employer]  I have reviewed the above documentation for accuracy and completeness, and I agree with the above.

## 2023-05-10 NOTE — Progress Notes (Signed)
DISCONTINUE ON PATHWAY REGIMEN - Breast     Cycles 1 through 4: A cycle is every 14 days:     Doxorubicin      Cyclophosphamide      Pegfilgrastim-xxxx    Cycles 5 through 16: A cycle is every 7 days:     Paclitaxel   **Always confirm dose/schedule in your pharmacy ordering system**  REASON: Other Reason PRIOR TREATMENT: [id number]: Dose-Dense AC-T (Paclitaxel Weekly) - [Doxorubicin + Cyclophosphamide q14 Days x 4 Cycles, Followed by Paclitaxel 80 mg/m2 Weekly x 12 Weeks] TREATMENT RESPONSE: Progressive Disease (PD)  START OFF PATHWAY REGIMEN - Breast   [id number]:Pembrolizumab 200 mg IV D1 q21 Days:   A cycle is every 21 days:     Pembrolizumab   **Always confirm dose/schedule in your pharmacy ordering system**  Patient Characteristics: Post-Neoadjuvant Therapy and Resection, M0, HER2 Negative, Residual Disease, ER Negative, Did Not Receive Neoadjuvant Pembrolizumab + Chemotherapy (and gBRCA-WT/Unknown) Therapeutic Status: Post-Neoadjuvant Therapy and Resection, M0 Residual Invasive Disease Post-Neoadjuvant Therapy<= Yes ER Status: Negative (-) HER2 Status: Negative (-) PR Status: Negative (-) BRCA Mutation Status: Absent Intent of Therapy: Curative Intent, Discussed with Patient

## 2023-05-11 ENCOUNTER — Other Ambulatory Visit: Payer: Self-pay

## 2023-05-11 DIAGNOSIS — Z170 Estrogen receptor positive status [ER+]: Secondary | ICD-10-CM

## 2023-05-12 ENCOUNTER — Telehealth: Payer: BC Managed Care – PPO | Admitting: Hematology and Oncology

## 2023-05-12 ENCOUNTER — Other Ambulatory Visit: Payer: Self-pay

## 2023-05-12 ENCOUNTER — Encounter (HOSPITAL_BASED_OUTPATIENT_CLINIC_OR_DEPARTMENT_OTHER): Payer: Self-pay | Admitting: General Surgery

## 2023-05-12 NOTE — Progress Notes (Signed)
   05/12/23 1348  PAT Phone Screen  Is the patient taking a GLP-1 receptor agonist? No  Do You Have Diabetes? No  Do You Have Hypertension? No  Have You Ever Been to the ER for Asthma? No  Have You Taken Oral Steroids in the Past 3 Months? No  Do you Take Phenteramine or any Other Diet Drugs? No  Recent  Lab Work, EKG, CXR? Yes  Where was this test performed? (S)  EKG 10/13/22, BMP 04/18/23, echo 11/15/22  Do you have a history of heart problems? (S)  Yes (dysrhythmia a-fib, tx w/cardizem, from 2012)  Cardiologist Name (S)  stabilized w/cardizem, no cardiologist needed since 2012.  Have you ever had tests on your heart? Yes  What cardiac tests were performed? Echo;EKG  What date/year were cardiac tests completed? (S)  ECHO on 11/15/22 WNL EF 60-65%, EKG 10/13/22  Any Recent Hospitalizations? No  Height 5\' 3"  (1.6 m)  Weight 65.9 kg  Pat Appointment Scheduled No  Reason for No Appointment Not Needed (pt has ensure and CHG at home)   Pt has no new symptoms of cardiac issues. Pt had surgery here at [facility] on 11/08/52 with no complications.

## 2023-05-13 ENCOUNTER — Encounter: Payer: Self-pay | Admitting: *Deleted

## 2023-05-13 ENCOUNTER — Telehealth: Payer: Self-pay

## 2023-05-13 ENCOUNTER — Telehealth: Payer: Self-pay | Admitting: *Deleted

## 2023-05-13 NOTE — Telephone Encounter (Signed)
Called and LVM regarding PET scan. We still do not have PA, so PET has been moved form 08/12 to 08/16. Advised Pt to give [place] a call if unable to make appt.

## 2023-05-13 NOTE — Telephone Encounter (Signed)
A Randomized, Open-label, Phase 3 Study of Adjuvant Sacituzumab Govitecan and Pembrolizumab Versus Treatment of Physician's Choice in Patients With Triple Negative Breast Cancer Who Have Residual Invasive Disease After Surgery and Neoadjuvant Therapy   Study Acronym: ASCENT-05  Research nurse attempted to reach the pt by phone to give her an update on her eligibility status regarding the study.  The pt was not available.  Therefore, the nurse left a brief message stating that no final determination has been made regarding her eligibility status for the above trial.  The pt was encouraged to contact the research nurse if she has any additional questions.  The pt was informed that she will be contacted when her eligibility status has been determined by the study Medical Monitor.   Janan Ridge RN, BSN, CCRP Clinical Research Nurse Lead 05/13/2023 10:08 AM

## 2023-05-16 ENCOUNTER — Ambulatory Visit (HOSPITAL_COMMUNITY): Payer: BC Managed Care – PPO

## 2023-05-16 ENCOUNTER — Telehealth: Payer: Self-pay | Admitting: Hematology and Oncology

## 2023-05-16 ENCOUNTER — Telehealth: Payer: Self-pay | Admitting: *Deleted

## 2023-05-16 ENCOUNTER — Encounter: Payer: Self-pay | Admitting: Hematology and Oncology

## 2023-05-16 ENCOUNTER — Encounter: Payer: Self-pay | Admitting: *Deleted

## 2023-05-16 ENCOUNTER — Encounter (HOSPITAL_COMMUNITY): Admission: RE | Admit: 2023-05-16 | Payer: BC Managed Care – PPO | Source: Ambulatory Visit

## 2023-05-16 NOTE — Telephone Encounter (Signed)
Scheduled appointments per WQ. Patient is aware of the made appointments.  

## 2023-05-16 NOTE — Telephone Encounter (Signed)
Study of Sacituzumab Govitecan-hziy and Pembrolizumab Versus Treatment ofPhysician's Choice in Patients With Triple Negative Breast Cancer Who Have Residual Invasive Disease After Surgery and Neoadjuvant Therapy  The research nurse called and informed the pt that the study chair felt the pt was not the appropriate fit for the above study.  The pt has not had an ALND.  The pt also received a lumpectomy before receiving her chemo.  These were some of the issues that made her ineligible for study participation.  Dr. Pamelia Hoit was notified of the study team's decision.  He asked that the nurse call and inform the pt that she will not be able to enroll on the ASCENT-05 study.  The nurse wished the pt well with her upcoming scans and treatments.  The pt had no questions for the nurse.   Janan Ridge RN, BSN, CCRP Clinical Research Nurse Lead 05/16/2023 12:06 PM

## 2023-05-17 NOTE — Progress Notes (Signed)
Location of Breast Cancer:  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female, estrogen receptor positive   Histology per Pathology Report:  05-03-23 FINAL MICROSCOPIC DIAGNOSIS:  A. BREAST, RIGHT, MASTECTOMY WITH LYMPH NODES: Invasive ductal carcinoma, status post neoadjuvant therapy, 8 cm in greatest linear dimension Calcifications associated with carcinoma: Not identified Margins, invasive carcinoma: Positive        Nearest margin invasive carcinoma: Posterior, multifocally positive 5 lymph nodes, positive for malignancy (5/5) with largest deposit 14 mm with focal extracapsular invasion Lymphovascular space invasion there is extensive lymphovascular invasion with evidence in all 4 quadrants of the random breast tissue as well as nipple See oncology table, below  INVASIVE CARCINOMA OF THE BREAST:  Resection  Procedure: Mastectomy Specimen Laterality: Right Histologic Type: Invasive ductal carcinoma, NOS/invasive mammary carcinoma, NST Histologic Grade:      Glandular (Acinar)/Tubular Differentiation: 3/3      Nuclear Pleomorphism: 3/3      Mitotic Rate: 3/3      Overall Grade: III/III Tumor Size: 8.0 x 6.0 x 3.5 cm Ductal Carcinoma In Situ: Not identified Lymphatic and/or Vascular Invasion: Extensively present; even present in random sections of upper outer, lower outer, upper inner and lower inner breast tissue and nipple section Treatment Effect in the Breast: No morphologic evidence of treatment effect; MD Dareen Piano residual cancer burden 4.644/class RCB-III Margins: Deep margin multifocally positive      Distance from Closest Margin (mm): Positive      Specify Closest Margin (required only if <68mm): Posterior DCIS Margins: N/A      Distance from Closest Margin (mm): N/A      Specify Closest Margin (required only if <15mm): N/A Regional Lymph Nodes:      Number of Lymph Nodes Examined: 5      Number of Sentinel Nodes Examined: 0      Number of  Lymph Nodes with Macrometastases (>2 mm): 5      Number of Lymph Nodes with Micrometastases: N/A      Number of Lymph Nodes with Isolated Tumor Cells (=0.2 mm or =200 cells): N/A      Size of Largest Metastatic Deposit (mm): 14      Extranodal Extension: Focally present Distant Metastasis:      Distant Site(s) Involved: N/A Breast Biomarker Testing Performed on Previous Biopsy:      Testing Performed on Case Number: [id number]            Estrogen Receptor: Positive, weak 10%            Progesterone Receptor: Negative            HER2: Negative (0 by IHC            Ki-67: 30% Pathologic Stage Classification (pTNM, AJCC 8th Edition): ypT3, pN2a Representative Tumor Block: A1 Comment(s): (v4.5.0.0)  Receptor Status: ER(10%), PR (0%), Her2-neu (neg), Ki-67(30%)  Did patient present with symptoms (if so, please note symptoms) or was this found on screening mammography?: screening mammogram  Past/Anticipated interventions by surgeon, if any: 11-18-22 Preoperative diagnosis: right breast cancer s/p lumpectomy/sn biopsy and one re-excision Postoperative diagnosis: Same as above Procedure re-excision right breast lumpectomy Surgeon: Dr. Harden Mo  05-03-23 Preoperative diagnosis: Node positive breast cancer status postlumpectomy and chemotherapy Postoperative diagnosis: Same as above Procedure: 1.  Port removal 2.  Right mastectomy Surgeon: Dr. Harden Mo  Past/Anticipated interventions by medical oncology, if any:  [name], MD 05/10/2023  Oncology History  Malignant neoplasm of upper-outer quadrant of  right breast in female, estrogen receptor positive (HCC)  09/15/2022 Initial Diagnosis    Screening mammogram detected right breast mass which measured 1.4 cm by ultrasound at 10 o'clock position, biopsy revealed grade 3 IDC with lymphovascular invasion ER 10%, PR 0%, Ki-67 30%, HER2 0 negative    09/25/2022 Cancer Staging    Staging form: Breast, AJCC 8th Edition -  Clinical: Stage IB (cT1c, cN0, cM0, G3, ER+, PR-, HER2-) - Signed by [name], MD on 09/25/2022 Histologic grading system: 3 grade system      Genetic Testing    [facility] CancerNext-Expanded Panel+RNA was Negative. Report date is 10/06/2021.   The CancerNext-Expanded gene panel offered by [facility] and includes sequencing, rearrangement, and RNA analysis for the following 77 genes: AIP, ALK, APC, ATM, AXIN2, BAP1, BARD1, BLM, BMPR1A, BRCA1, BRCA2, BRIP1, CDC73, CDH1, CDK4, CDKN1B, CDKN2A, CHEK2, CTNNA1, DICER1, FANCC, FH, FLCN, GALNT12, KIF1B, LZTR1, MAX, MEN1, MET, MLH1, MSH2, MSH3, MSH6, MUTYH, NBN, NF1, NF2, NTHL1, PALB2, PHOX2B, PMS2, POT1, PRKAR1A, PTCH1, PTEN, RAD51C, RAD51D, RB1, RECQL, RET, SDHA, SDHAF2, SDHB, SDHC, SDHD, SMAD4, SMARCA4, SMARCB1, SMARCE1, STK11, SUFU, TMEM127, TP53, TSC1, TSC2, VHL and XRCC2 (sequencing and deletion/duplication); EGFR, EGLN1, HOXB13, KIT, MITF, PDGFRA, POLD1, and POLE (sequencing only); EPCAM and GREM1 (deletion/duplication only).     10/14/2022 Surgery    Right lumpectomy: Grade 3 invasive poorly differentiated ductal adenocarcinoma 1.4 cm with focal lobular features with high-grade DCIS, inferior margin positive, angiolymphatic invasion present, 5/5 lymph nodes positive, additional medial margin: Poorly differentiated carcinoma with lymphoid stroma, additional inferior margin: Positive, ER 10% weak, PR 0%, HER2 0, Ki-67 30%    10/21/2022 Cancer Staging    Staging form: Breast, AJCC 8th Edition - Pathologic: Stage IIIC (pT1c, pN2a, cM0, G3, ER-, PR-, HER2-) - Signed by [name], MD on 10/21/2022 Stage prefix: Initial diagnosis Histologic grading system: 3 grade system    11/08/2022 Surgery    Margin reexcision: Inferior margin: Grade 3 IDC, new margin positive Medial margin: Grade 3 IDC new margin widely involved    11/18/2022 Surgery    Right inferior margin reexcision: Invasive poorly differentiated adenocarcinoma grade 3 present in new inferior  margin.  Medial margin reexcision: Invasive poorly differentiated adenocarcinoma grade 3 present at new medial margin, posterior margin: Poorly differentiated adenocarcinoma grade 3, margin negative   11/08/2022 and 11/18/2022: Final margin focal positivity. After much discussion the plan is to finish her systemic treatment and come back at a later point to do mastectomy.     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  No distant metastatic  disease. Bone scan 10/29/2022: No evidence of bone metastasis   11/08/2022 and 11/18/2022: Margin reexcision surgeries: Final margin focal positivity.  After much discussion the plan is to finish her systemic treatment and come back at a later point to do mastectomy.   Treatment plan: Adjuvant chemotherapy with dose dense Adriamycin and Cytoxan followed by Taxol to be completed 05/02/2023 Mastectomy by Dr. Dwain Sarna 05/03/2023:Right mastectomy: 8 cm grade 3 IDC with extensive  lymphatic and vascular invasion RCB class III, deep margin multifocally positive, 5/5 lymph nodes positive, ER weak 10%, PR 0%, HER2 0, Ki-67 30%, total lymph nodes: 10/10 (prior lumpectomy she had 5 positive nodes) Adjuvant radiation Very little to mild benefit from antiestrogen therapy and therefore we decided that she would not need to take it. ------------------------------------------------------------------------------------------------------------------------------------------------ Treatment plan: Based upon extensive amount of residual breast cancer following neoadjuvant chemotherapy with Adriamycin Cytoxan and Taxol, I recommended Keytruda with capecitabine. I also discussed if she is interested in participating in a clinical trial called ASCENT 05 which randomizes patients between [place] Xeloda versus Cher Nakai.   I went over the risks and benefits of each of these drugs in great detail. I will request Dr. Harden Mo to place the port. I will request Dr. Arnette Schaumann to see her for adjuvant radiation. We are able to give her at least 2 doses of Keytruda before randomization on the clinical trial.   I recommended another round of CT scans. Return to clinic to start immunotherapy as soon as the port is in place.  Lymphedema issues, if any:  none to report  Pain issues, if any:  none to report  SAFETY ISSUES: Prior radiation? no Pacemaker/ICD? no Possible current pregnancy?no Is the patient on methotrexate? no  Current Complaints / other details:  Pt has no major concerns or questions. She is ready to get started.

## 2023-05-18 ENCOUNTER — Encounter: Payer: Self-pay | Admitting: Hematology and Oncology

## 2023-05-18 ENCOUNTER — Other Ambulatory Visit: Payer: Self-pay

## 2023-05-18 ENCOUNTER — Telehealth: Payer: Self-pay

## 2023-05-18 ENCOUNTER — Other Ambulatory Visit: Payer: Self-pay | Admitting: General Surgery

## 2023-05-18 DIAGNOSIS — Z170 Estrogen receptor positive status [ER+]: Secondary | ICD-10-CM

## 2023-05-18 NOTE — Telephone Encounter (Signed)
S/w pt and her husband, Dr Neita Carp this morning regarding her order for PET scan. Insurance is requiring CT before they will approve a PET. There is an option for appeal but pt states she would prefer to go ahead and cancel the PET and have CT CAP & NM Bone Scan per MD.   Orders placed per MD and message sent to our PA team to advise.

## 2023-05-18 NOTE — Telephone Encounter (Signed)
Pt is scheduled for CT CAP and NM Bone scan 05/19/23 at 11 AM inj, 1130 CT and 1 PM return for bone scan. She understands these directions and will f/u w/ MD 8/21 as scheduled.

## 2023-05-19 ENCOUNTER — Other Ambulatory Visit: Payer: Self-pay

## 2023-05-19 ENCOUNTER — Ambulatory Visit (HOSPITAL_COMMUNITY): Payer: BC Managed Care – PPO

## 2023-05-19 ENCOUNTER — Ambulatory Visit (HOSPITAL_BASED_OUTPATIENT_CLINIC_OR_DEPARTMENT_OTHER)
Admission: RE | Admit: 2023-05-19 | Discharge: 2023-05-19 | Disposition: A | Discharge status: Home / Self Care | Payer: BC Managed Care – PPO | Source: Ambulatory Visit | Attending: General Surgery | Admitting: General Surgery

## 2023-05-19 ENCOUNTER — Ambulatory Visit (HOSPITAL_COMMUNITY)
Admission: RE | Admit: 2023-05-19 | Discharge: 2023-05-19 | Disposition: A | Discharge status: Home / Self Care | Payer: BC Managed Care – PPO | Source: Ambulatory Visit | Attending: Hematology and Oncology | Admitting: Hematology and Oncology

## 2023-05-19 ENCOUNTER — Other Ambulatory Visit (HOSPITAL_COMMUNITY): Payer: BC Managed Care – PPO

## 2023-05-19 ENCOUNTER — Encounter (HOSPITAL_BASED_OUTPATIENT_CLINIC_OR_DEPARTMENT_OTHER)
Admission: RE | Disposition: A | Discharge status: Home / Self Care | Payer: Self-pay | Source: Ambulatory Visit | Attending: General Surgery

## 2023-05-19 ENCOUNTER — Ambulatory Visit (HOSPITAL_BASED_OUTPATIENT_CLINIC_OR_DEPARTMENT_OTHER): Payer: BC Managed Care – PPO | Admitting: Anesthesiology

## 2023-05-19 ENCOUNTER — Encounter (HOSPITAL_BASED_OUTPATIENT_CLINIC_OR_DEPARTMENT_OTHER): Payer: Self-pay | Admitting: General Surgery

## 2023-05-19 ENCOUNTER — Other Ambulatory Visit: Payer: Self-pay | Admitting: *Deleted

## 2023-05-19 DIAGNOSIS — Z170 Estrogen receptor positive status [ER+]: Secondary | ICD-10-CM | POA: Insufficient documentation

## 2023-05-19 DIAGNOSIS — C50411 Malignant neoplasm of upper-outer quadrant of right female breast: Secondary | ICD-10-CM | POA: Insufficient documentation

## 2023-05-19 DIAGNOSIS — I4891 Unspecified atrial fibrillation: Secondary | ICD-10-CM | POA: Insufficient documentation

## 2023-05-19 DIAGNOSIS — J45909 Unspecified asthma, uncomplicated: Secondary | ICD-10-CM | POA: Diagnosis not present

## 2023-05-19 HISTORY — DX: Cardiac arrhythmia, unspecified: I49.9

## 2023-05-19 HISTORY — PX: PORTACATH PLACEMENT: SHX2246

## 2023-05-19 SURGERY — INSERTION, TUNNELED CENTRAL VENOUS DEVICE, WITH PORT
Anesthesia: General | Site: Chest | Laterality: Left

## 2023-05-19 MED ORDER — HEPARIN SOD (PORK) LOCK FLUSH 100 UNIT/ML IV SOLN
INTRAVENOUS | Status: AC
  Filled 2023-05-19: qty 5

## 2023-05-19 MED ORDER — HEPARIN (PORCINE) IN NACL 1000-0.9 UT/500ML-% IV SOLN
INTRAVENOUS | Status: AC
  Filled 2023-05-19: qty 500

## 2023-05-19 MED ORDER — FENTANYL CITRATE (PF) 100 MCG/2ML IJ SOLN: 25.0000 ug | INTRAMUSCULAR | Status: DC | PRN

## 2023-05-19 MED ORDER — HEPARIN (PORCINE) IN NACL 2-0.9 UNITS/ML
INTRAMUSCULAR | Status: AC | PRN
  Administered 2023-05-19: 1 via INTRAVENOUS

## 2023-05-19 MED ORDER — IOHEXOL 300 MG/ML  SOLN
100.0000 mL | Freq: Once | INTRAMUSCULAR | Status: AC | PRN
  Administered 2023-05-19: 100 mL via INTRAVENOUS

## 2023-05-19 MED ORDER — HEPARIN SOD (PORK) LOCK FLUSH 100 UNIT/ML IV SOLN
500.0000 [IU] | Freq: Once | INTRAVENOUS | Status: AC
  Administered 2023-05-19: 500 [IU] via INTRAVENOUS

## 2023-05-19 MED ORDER — DEXAMETHASONE SODIUM PHOSPHATE 10 MG/ML IJ SOLN
INTRAMUSCULAR | Status: AC
  Filled 2023-05-19: qty 1

## 2023-05-19 MED ORDER — PHENYLEPHRINE HCL (PRESSORS) 10 MG/ML IV SOLN
INTRAVENOUS | Status: DC | PRN
Start: 2023-05-19 — End: 2023-05-19
  Administered 2023-05-19 (×2): 80 ug via INTRAVENOUS

## 2023-05-19 MED ORDER — CEFAZOLIN SODIUM-DEXTROSE 2-4 GM/100ML-% IV SOLN
INTRAVENOUS | Status: AC
  Filled 2023-05-19: qty 100

## 2023-05-19 MED ORDER — ACETAMINOPHEN 500 MG PO TABS: 1000.0000 mg | ORAL_TABLET | ORAL | Status: DC

## 2023-05-19 MED ORDER — ENSURE PRE-SURGERY PO LIQD: 296.0000 mL | Freq: Once | ORAL | Status: DC

## 2023-05-19 MED ORDER — TECHNETIUM TC 99M MEDRONATE IV KIT
20.0000 | PACK | Freq: Once | INTRAVENOUS | Status: AC | PRN
  Administered 2023-05-19: 21 via INTRAVENOUS

## 2023-05-19 MED ORDER — FENTANYL CITRATE (PF) 100 MCG/2ML IJ SOLN
INTRAMUSCULAR | Status: DC | PRN
  Administered 2023-05-19 (×2): 50 ug via INTRAVENOUS

## 2023-05-19 MED ORDER — BUPIVACAINE HCL (PF) 0.25 % IJ SOLN
INTRAMUSCULAR | Status: DC | PRN
  Administered 2023-05-19: 10 mL

## 2023-05-19 MED ORDER — BUPIVACAINE HCL (PF) 0.25 % IJ SOLN
INTRAMUSCULAR | Status: AC
  Filled 2023-05-19: qty 30

## 2023-05-19 MED ORDER — DEXAMETHASONE SODIUM PHOSPHATE 4 MG/ML IJ SOLN
INTRAMUSCULAR | Status: DC | PRN
  Administered 2023-05-19: 10 mg via INTRAVENOUS

## 2023-05-19 MED ORDER — LIDOCAINE HCL (CARDIAC) PF 100 MG/5ML IV SOSY
PREFILLED_SYRINGE | INTRAVENOUS | Status: DC | PRN
  Administered 2023-05-19: 80 mg via INTRAVENOUS

## 2023-05-19 MED ORDER — ONDANSETRON HCL 4 MG/2ML IJ SOLN
INTRAMUSCULAR | Status: DC | PRN
  Administered 2023-05-19: 4 mg via INTRAVENOUS

## 2023-05-19 MED ORDER — HEPARIN SOD (PORK) LOCK FLUSH 100 UNIT/ML IV SOLN
INTRAVENOUS | Status: DC | PRN
  Administered 2023-05-19: 400 [IU] via INTRAVENOUS

## 2023-05-19 MED ORDER — LIDOCAINE 2% (20 MG/ML) 5 ML SYRINGE
INTRAMUSCULAR | Status: AC
  Filled 2023-05-19: qty 5

## 2023-05-19 MED ORDER — FENTANYL CITRATE (PF) 100 MCG/2ML IJ SOLN
INTRAMUSCULAR | Status: AC
  Filled 2023-05-19: qty 2

## 2023-05-19 MED ORDER — MIDAZOLAM HCL 5 MG/5ML IJ SOLN
INTRAMUSCULAR | Status: DC | PRN
  Administered 2023-05-19: 2 mg via INTRAVENOUS

## 2023-05-19 MED ORDER — CHLORHEXIDINE GLUCONATE CLOTH 2 % EX PADS: 6.0000 | MEDICATED_PAD | Freq: Once | CUTANEOUS | Status: DC

## 2023-05-19 MED ORDER — PROPOFOL 10 MG/ML IV BOLUS
INTRAVENOUS | Status: DC | PRN
Start: 2023-05-19 — End: 2023-05-19
  Administered 2023-05-19: 150 mg via INTRAVENOUS
  Administered 2023-05-19: 50 mg via INTRAVENOUS

## 2023-05-19 MED ORDER — CEFAZOLIN SODIUM-DEXTROSE 2-4 GM/100ML-% IV SOLN
2.0000 g | INTRAVENOUS | Status: AC
  Administered 2023-05-19: 2 g via INTRAVENOUS

## 2023-05-19 MED ORDER — ACETAMINOPHEN 500 MG PO TABS
ORAL_TABLET | ORAL | Status: AC
  Filled 2023-05-19: qty 2

## 2023-05-19 MED ORDER — LACTATED RINGERS IV SOLN: INTRAVENOUS | Status: DC

## 2023-05-19 MED ORDER — PROPOFOL 500 MG/50ML IV EMUL
INTRAVENOUS | Status: DC | PRN
  Administered 2023-05-19: 200 ug/kg/min via INTRAVENOUS

## 2023-05-19 MED ORDER — MIDAZOLAM HCL 2 MG/2ML IJ SOLN
INTRAMUSCULAR | Status: AC
  Filled 2023-05-19: qty 2

## 2023-05-19 MED ORDER — ONDANSETRON HCL 4 MG/2ML IJ SOLN
INTRAMUSCULAR | Status: AC
  Filled 2023-05-19: qty 2

## 2023-05-19 MED ORDER — ONDANSETRON HCL 4 MG/2ML IJ SOLN: 4.0000 mg | Freq: Once | INTRAMUSCULAR | Status: DC | PRN

## 2023-05-19 SURGICAL SUPPLY — 49 items
ADH SKN CLS APL DERMABOND .7 (GAUZE/BANDAGES/DRESSINGS) ×1
APL PRP STRL LF DISP 70% ISPRP (MISCELLANEOUS) ×1
APL SKNCLS STERI-STRIP NONHPOA (GAUZE/BANDAGES/DRESSINGS) ×1
BAG DECANTER FOR FLEXI CONT (MISCELLANEOUS) ×1 IMPLANT
BENZOIN TINCTURE PRP APPL 2/3 (GAUZE/BANDAGES/DRESSINGS) ×1 IMPLANT
BLADE SURG 11 STRL SS (BLADE) ×1 IMPLANT
BLADE SURG 15 STRL LF DISP TIS (BLADE) ×1 IMPLANT
BLADE SURG 15 STRL SS (BLADE) ×1
CANISTER SUCT 1200ML W/VALVE (MISCELLANEOUS) IMPLANT
CHLORAPREP W/TINT 26 (MISCELLANEOUS) ×1 IMPLANT
COVER BACK TABLE 60X90IN (DRAPES) ×1 IMPLANT
COVER MAYO STAND STRL (DRAPES) ×1 IMPLANT
COVER PROBE 5X48 (MISCELLANEOUS)
DERMABOND ADVANCED .7 DNX12 (GAUZE/BANDAGES/DRESSINGS) ×1 IMPLANT
DRAPE C-ARM 42X72 X-RAY (DRAPES) ×1 IMPLANT
DRAPE LAPAROSCOPIC ABDOMINAL (DRAPES) ×1 IMPLANT
DRAPE UTILITY XL STRL (DRAPES) ×1 IMPLANT
DRSG TEGADERM 4X4.75 (GAUZE/BANDAGES/DRESSINGS) IMPLANT
ELECT COATED BLADE 2.86 ST (ELECTRODE) ×1 IMPLANT
ELECT REM PT RETURN 9FT ADLT (ELECTROSURGICAL) ×1
ELECTRODE REM PT RTRN 9FT ADLT (ELECTROSURGICAL) ×1 IMPLANT
GAUZE 4X4 16PLY ~~LOC~~+RFID DBL (SPONGE) ×1 IMPLANT
GAUZE SPONGE 4X4 12PLY STRL LF (GAUZE/BANDAGES/DRESSINGS) ×1 IMPLANT
GLOVE BIO SURGEON STRL SZ7 (GLOVE) ×1 IMPLANT
GLOVE BIOGEL PI IND STRL 7.5 (GLOVE) ×1 IMPLANT
GOWN STRL REUS W/ TWL LRG LVL3 (GOWN DISPOSABLE) ×2 IMPLANT
GOWN STRL REUS W/TWL LRG LVL3 (GOWN DISPOSABLE) ×2
IV KIT MINILOC 20X1 SAFETY (NEEDLE) IMPLANT
KIT CVR 48X5XPRB PLUP LF (MISCELLANEOUS) IMPLANT
KIT PORT POWER 8FR ISP CVUE (Port) IMPLANT
NDL HYPO 25X1 1.5 SAFETY (NEEDLE) ×1 IMPLANT
NDL SAFETY ECLIP 18X1.5 (MISCELLANEOUS) IMPLANT
NEEDLE HYPO 25X1 1.5 SAFETY (NEEDLE) ×1 IMPLANT
PACK BASIN DAY SURGERY FS (CUSTOM PROCEDURE TRAY) ×1 IMPLANT
PENCIL SMOKE EVACUATOR (MISCELLANEOUS) ×1 IMPLANT
SLEEVE SCD COMPRESS KNEE MED (STOCKING) ×1 IMPLANT
SPIKE FLUID TRANSFER (MISCELLANEOUS) IMPLANT
STRIP CLOSURE SKIN 1/2X4 (GAUZE/BANDAGES/DRESSINGS) ×1 IMPLANT
SUT MNCRL AB 4-0 PS2 18 (SUTURE) ×1 IMPLANT
SUT PROLENE 2 0 SH DA (SUTURE) ×1 IMPLANT
SUT SILK 2 0 TIES 17X18 (SUTURE)
SUT SILK 2-0 18XBRD TIE BLK (SUTURE) IMPLANT
SUT VIC AB 3-0 SH 27 (SUTURE) ×1
SUT VIC AB 3-0 SH 27X BRD (SUTURE) ×1 IMPLANT
SYR 5ML LUER SLIP (SYRINGE) ×1 IMPLANT
SYR CONTROL 10ML LL (SYRINGE) ×1 IMPLANT
TOWEL GREEN STERILE FF (TOWEL DISPOSABLE) ×1 IMPLANT
TUBE CONNECTING 20X1/4 (TUBING) IMPLANT
YANKAUER SUCT BULB TIP NO VENT (SUCTIONS) IMPLANT

## 2023-05-19 NOTE — Discharge Instructions (Addendum)
PORT-A-CATH: POST OP INSTRUCTIONS  Always review your discharge instruction sheet given to you by the facility where your surgery was performed.   A prescription for pain medication may be given to you upon discharge. Take your pain medication as prescribed, if needed. If narcotic pain medicine is not needed, then you make take acetaminophen (Tylenol) or ibuprofen (Advil) as needed.  Take your usually prescribed medications unless otherwise directed. If you need a refill on your pain medication, please contact our office. All narcotic pain medicine now requires a paper prescription.  Phoned in and fax refills are no longer allowed by law.  Prescriptions will not be filled after 5 pm or on weekends.  You should follow a light diet for the remainder of the day after your procedure. Most patients will experience some mild swelling and/or bruising in the area of the incision. It may take several days to resolve. It is common to experience some constipation if taking pain medication after surgery. Increasing fluid intake and taking a stool softener (such as Colace) will usually help or prevent this problem from occurring. A mild laxative (Milk of Magnesia or Miralax) should be taken according to package directions if there are no bowel movements after 48 hours.  Unless discharge instructions indicate otherwise, you may remove your bandages 48 hours after surgery, and you may shower at that time. You may have steri-strips (small white skin tapes) in place directly over the incision.  These strips should be left on the skin for 7-10 days.  If your surgeon used Dermabond (skin glue) on the incision, you may shower in 24 hours.  The glue will flake off over the next 2-3 weeks.  If your port is left accessed at the end of surgery (needle left in port), the dressing cannot get wet and should only by changed by a healthcare professional. When the port is no longer accessed (when the needle has been removed),  follow step 7.   ACTIVITIES:  Limit activity involving your arms for the next 72 hours. Do no strenuous exercise or activity for 1 week. You may drive when you are no longer taking prescription pain medication, you can comfortably wear a seatbelt, and you can maneuver your car. 10.You may need to see your doctor in the office for a follow-up appointment.  Please       check with your doctor.  11.When you receive a new Port-a-Cath, you will get a product guide and        ID card.  Please keep them in case you need them.  WHEN TO CALL YOUR DOCTOR [phone]): Fever over 101.0 Chills Continued bleeding from incision Increased redness and tenderness at the site Shortness of breath, difficulty breathing   The clinic staff is available to answer your questions during regular business hours. Please don't hesitate to call and ask to speak to one of the nurses or medical assistants for clinical concerns. If you have a medical emergency, go to the nearest emergency room or call 911.  A surgeon from [facility] is always on call at the hospital.     For further information, please visit [email or web address]    Post Anesthesia Home Care Instructions  Activity: Get plenty of rest for the remainder of the day. A responsible individual must stay with you for 24 hours following the procedure.  For the next 24 hours, DO NOT: -Drive a car -Advertising copywriter -Drink alcoholic beverages -Take any medication unless instructed by your physician -  Make any legal decisions or sign important papers.  Meals: Start with liquid foods such as gelatin or soup. Progress to regular foods as tolerated. Avoid greasy, spicy, heavy foods. If nausea and/or vomiting occur, drink only clear liquids until the nausea and/or vomiting subsides. Call your physician if vomiting continues.  Special Instructions/Symptoms: Your throat may feel dry or sore from the anesthesia or the breathing tube  placed in your throat during surgery. If this causes discomfort, gargle with warm salt water. The discomfort should disappear within 24 hours.  If you had a scopolamine patch placed behind your ear for the management of post- operative nausea and/or vomiting:  1. The medication in the patch is effective for 72 hours, after which it should be removed.  Wrap patch in a tissue and discard in the trash. Wash hands thoroughly with soap and water. 2. You may remove the patch earlier than 72 hours if you experience unpleasant side effects which may include dry mouth, dizziness or visual disturbances. 3. Avoid touching the patch. Wash your hands with soap and water after contact with the patch.    May have Tylenol after 11:00 if needed.

## 2023-05-19 NOTE — Anesthesia Preprocedure Evaluation (Addendum)
Anesthesia Evaluation  Patient identified by MRN, date of birth, ID band Patient awake    Reviewed: Allergy & Precautions, NPO status , [name] Chart, lab work & pertinent test results  History of Anesthesia Complications (+) PONV and history of anesthetic complications  Airway Mallampati: II  TM Distance: >3 FB Neck ROM: Full    Dental  (+) Teeth Intact, Dental Advisory Given   Pulmonary asthma    Pulmonary exam normal breath sounds clear to auscultation       Cardiovascular Exercise Tolerance: Good (-) angina (-) Past MI + dysrhythmias Atrial Fibrillation  Rhythm:Regular Rate:Normal     Neuro/Psych  Headaches    GI/Hepatic negative GI ROS, Neg liver ROS,,,  Endo/Other  Hypothyroidism    Renal/GU negative Renal ROS     Musculoskeletal negative musculoskeletal ROS (+)    Abdominal   Peds  Hematology negative hematology ROS (+)   Anesthesia Other Findings Day of surgery medications reviewed with the patient.  H/o breast cancer   Reproductive/Obstetrics                             Anesthesia Physical Anesthesia Plan  ASA: 3  Anesthesia Plan: General   Post-op Pain Management: Tylenol PO (pre-op)*   Induction: Intravenous  PONV Risk Score and Plan: 4 or greater and Midazolam, Dexamethasone, Ondansetron and TIVA  Airway Management Planned: LMA  Additional Equipment:   Intra-op Plan:   Post-operative Plan: Extubation in OR  Informed Consent: I have reviewed the patients History and Physical, chart, labs and discussed the procedure including the risks, benefits and alternatives for the proposed anesthesia with the patient or authorized representative who has indicated his/her understanding and acceptance.     Dental advisory given  Plan Discussed with: CRNA  Anesthesia Plan Comments:         Anesthesia Quick Evaluation

## 2023-05-19 NOTE — Anesthesia Procedure Notes (Signed)
Procedure Name: LMA Insertion Date/Time: 05/19/2023 7:31 AM  Performed by: Burna Cash, CRNAPre-anesthesia Checklist: Patient identified, Emergency Drugs available, Suction available and Patient being monitored Patient Re-evaluated:Patient Re-evaluated prior to induction Oxygen Delivery Method: Circle system utilized Preoxygenation: Pre-oxygenation with 100% oxygen Induction Type: IV induction Ventilation: Mask ventilation without difficulty LMA: LMA inserted LMA Size: 4.0 Number of attempts: 1 Airway Equipment and Method: Bite block Placement Confirmation: positive ETCO2 Tube secured with: Tape Dental Injury: Teeth and Oropharynx as per pre-operative assessment

## 2023-05-19 NOTE — Interval H&P Note (Signed)
History and Physical Interval Note:  05/19/2023 6:58 AM Needs port replaced Caitlin [name]  has presented today for surgery, with the diagnosis of BREAST CANCER.  The various methods of treatment have been discussed with the patient and family. After consideration of risks, benefits and other options for treatment, the patient has consented to  Procedure(s): PORT PLACEMENT WITH ULTRASOUND GUIDANCE (N/A) as a surgical intervention.  The patient's history has been reviewed, patient examined, no change in status, stable for surgery.  I have reviewed the patient's chart and labs.  Questions were answered to the patient's satisfaction.     Caitlin [name]

## 2023-05-19 NOTE — Op Note (Signed)
Preoperative diagnosis: Breast cancer Postoperative diagnosis: Same as above Procedure: Left internal jugular vein port placement Surgeon: Dr Harden Mo Esthesia: General Estimated blood loss: Minimal Complications: Drains: Sponge needle count was correct completion Disposition recovery stable addition   Indications: 5 yof who has breast cancer with long history. She needs venous access again so we discussed port placement.    Procedure: After informed consent was obtained she was taken to the operating room.  She was placed under general anesthesia.  She was prepped and draped in a standard sterile surgical fashion.  A surgical timeout was then performed.   I accessed the left internal jugular vein using ultrasound guidance on the first pass.  The wire was placed.  This was confirmed to be in the vein by ultrasound as well as by fluoroscopy.  I then made an incision at her old port site.  I created a pocket.  I tunneled line between the 2 sides.  I then using fluoroscopy placed the dilator over the wire.  The wire assembly was then removed.  I then placed the line through the peel-away sheath.  I removed the peel-away sheath.  I pulled the line back to be in the distal vena cava.  This was in good position and is ready for use.  I then attached this to the port.  I sutured this into place with a 2-0 Prolene suture.  I then accessed the port.  It flushed easily and aspirated blood.  I placed heparin in it.  I then closed this with 3-0 Vicryl 4-0 Monocryl.  Glue and Steri-Strips applied.  She tolerated this well was transferred recovery.

## 2023-05-19 NOTE — Anesthesia Postprocedure Evaluation (Signed)
Anesthesia Post Note  Patient: Caitlin [name]  Procedure(s) Performed: PORT PLACEMENT WITH ULTRASOUND GUIDANCE (Left: Chest)     Patient location during evaluation: PACU Anesthesia Type: General Level of consciousness: awake and alert Pain management: pain level controlled Vital Signs Assessment: post-procedure vital signs reviewed and stable Respiratory status: spontaneous breathing, nonlabored ventilation, respiratory function stable and patient connected to nasal cannula oxygen Cardiovascular status: blood pressure returned to baseline and stable Postop Assessment: no apparent nausea or vomiting Anesthetic complications: no   No notable events documented.  Last Vitals:  Vitals:   05/19/23 0900 05/19/23 0915  BP: 120/79 120/74  Pulse: 70 76  Resp: 12 16  Temp:  (!) 36.3 C  SpO2: 95% 95%    Last Pain:  Vitals:   05/19/23 0915  TempSrc:   PainSc: 2                  Collene Schlichter

## 2023-05-19 NOTE — Transfer of Care (Signed)
Immediate Anesthesia Transfer of Care Note  Patient: Caitlin [name]  Procedure(s) Performed: PORT PLACEMENT WITH ULTRASOUND GUIDANCE (Left: Chest)  Patient Location: PACU  Anesthesia Type:General  Level of Consciousness: sedated  Airway & Oxygen Therapy: Patient Spontanous Breathing and Patient connected to face mask oxygen  Post-op Assessment: Report given to RN and Post -op Vital signs reviewed and stable  Post vital signs: Reviewed and stable  Last Vitals:  Vitals Value Taken Time  BP 80/49 05/19/23 0824  Temp    Pulse 72 05/19/23 0827  Resp 17 05/19/23 0827  SpO2 96 % 05/19/23 0827  Vitals shown include unfiled device data.  Last Pain:  Vitals:   05/19/23 0630  TempSrc: Temporal  PainSc: 0-No pain         Complications: No notable events documented.

## 2023-05-20 ENCOUNTER — Encounter (HOSPITAL_COMMUNITY): Payer: BC Managed Care – PPO

## 2023-05-20 ENCOUNTER — Encounter (HOSPITAL_BASED_OUTPATIENT_CLINIC_OR_DEPARTMENT_OTHER): Payer: Self-pay | Admitting: General Surgery

## 2023-05-21 ENCOUNTER — Other Ambulatory Visit: Payer: Self-pay

## 2023-05-24 ENCOUNTER — Inpatient Hospital Stay: Payer: BC Managed Care – PPO | Admitting: Pharmacist

## 2023-05-24 ENCOUNTER — Other Ambulatory Visit: Payer: Self-pay

## 2023-05-24 ENCOUNTER — Encounter: Payer: Self-pay | Admitting: Hematology and Oncology

## 2023-05-24 ENCOUNTER — Other Ambulatory Visit: Payer: BC Managed Care – PPO

## 2023-05-24 VITALS — BP 135/88 | HR 75 | Temp 97.7°F | Resp 18 | Ht 63.0 in | Wt 146.1 lb

## 2023-05-24 DIAGNOSIS — C50411 Malignant neoplasm of upper-outer quadrant of right female breast: Secondary | ICD-10-CM

## 2023-05-24 DIAGNOSIS — Z5112 Encounter for antineoplastic immunotherapy: Secondary | ICD-10-CM | POA: Diagnosis not present

## 2023-05-24 NOTE — Progress Notes (Signed)
[facility]       Telephone: [phone]?Fax: [phone]   Oncology Clinical Pharmacist Practitioner Initial Assessment  Caitlin [name] is a 67 y.o. female with a diagnosis of breast cancer. They were contacted today via in-person visit.  Indication/Regimen Pembrolizumab [facility]) is being used appropriately for treatment of breast cancer by Dr. [name].      Wt Readings from Last 1 Encounters:  05/19/23 145 lb 11.6 oz (66.1 kg)    Estimated body surface area is 1.71 meters squared as calculated from the following:   Height as of 05/19/23: 5\' 3"  (1.6 m).   Weight as of 05/19/23: 145 lb 11.6 oz (66.1 kg).  The dosing regimen is every 21 days  200 mg IV on Day 1  Dose Modifications None   Allergies Allergies  Allergen Reactions   Penicillins Rash    childhood reaction    Vitals: No vitals or labs were done today for this chemotherapy education visit     05/19/2023    9:15 AM 05/19/2023    9:00 AM 05/19/2023    8:45 AM  Oncology Vitals  Temp 97.4 F (36.3 C)    Pulse Rate 76 70 76  BP 120/74 120/79 113/77  Resp 16 12 10   SpO2 95 % 95 % 98 %     Laboratory Data    Latest Ref Rng & Units 04/18/2023    7:32 AM 04/11/2023    7:40 AM 04/04/2023    8:10 AM  CBC EXTENDED  WBC 4.0 - 10.5 K/uL 3.1  3.4  3.2   RBC 3.87 - 5.11 MIL/uL 3.48  3.51  3.48   Hemoglobin 12.0 - 15.0 g/dL 69.6  29.5  28.4   HCT 36.0 - 46.0 % 33.7  34.3  34.0   Platelets 150 - 400 K/uL 271  277  282   NEUT# 1.7 - 7.7 K/uL 1.9  2.0  1.9   Lymph# 0.7 - 4.0 K/uL 0.7  0.8  0.8        Latest Ref Rng & Units 04/18/2023    7:32 AM 04/11/2023    7:40 AM 04/04/2023    8:10 AM  CMP  Glucose 70 - 99 mg/dL 89  83  87   BUN 8 - 23 mg/dL 14  14  14    Creatinine 0.44 - 1.00 mg/dL 1.32  4.40  1.02   Sodium 135 - 145 mmol/L 140  140  139   Potassium 3.5 - 5.1 mmol/L 3.7  3.8  3.8   Chloride 98 - 111 mmol/L 107  107  106   CO2 22 - 32 mmol/L 25  26  26    Calcium 8.9 - 10.3 mg/dL 9.1   9.5  8.8   Total Protein 6.5 - 8.1 g/dL 6.5  6.7  6.7   Total Bilirubin 0.3 - 1.2 mg/dL 0.4  0.3  0.4   Alkaline Phos 38 - 126 U/L 63  78  78   AST 15 - 41 U/L 15  15  15    ALT 0 - 44 U/L 11  11  12     Contraindications Contraindications were reviewed? Yes Contraindications to therapy were identified? No   Safety Precautions The following safety precautions for the use of pembrolizumab were reviewed:  Fever: reviewed the importance of having a thermometer and the Centers for Disease Control and Prevention (CDC) definition of fever which is 100.37F (38C) or higher. Patient should call 24/7 triage at [phone]  if experiencing a fever or any other symptoms Fatigue Muscle or joint pain or weakness Rash or itchy skin Diarrhea Cough or shortness of breath Lung (respiratory tract) infection Changes in liver function Changes in kidney function Changes in electrolyte levels Changes in hormone levels (weight/mood changes, headaches, fatigue, sweating, hypertension, tachycardia) Inflammation to your colon (diarrhea or severe abdominal pain) Inflammation to your liver (jaundice, severe nausea and vomiting, easy bruising or bleeding) Inflammation to your lung (new or worsening cough, shortness of breath, chest pain, difficulty breathing or wheezing) Severe skin reactions resulting in flu-like symptoms and painful rashes that spread or blister Vision changes (eye pain, swelling, redness, changes in vision such as flashes of light, blurred vision, floaters in our field of vision, light hurting your eyes) Infusion reactions Handling body fluids and waste Intimacy, sexual activity, contraception, and fertility  Medication Reconciliation Current Outpatient Medications  Medication Sig Dispense Refill   acetaminophen (TYLENOL) 500 MG tablet Take 500 mg by mouth every 6 (six) hours as needed for moderate pain.     albuterol (VENTOLIN HFA) 108 (90 Base) MCG/ACT inhaler Inhale 2 puffs into the  lungs every 4 (four) hours as needed for shortness of breath (Asthma).     aspirin 81 MG tablet Take 81 mg by mouth daily.       benzonatate (TESSALON) 200 MG capsule Take 200 mg by mouth daily as needed for cough.     diltiazem (CARDIZEM CD) 120 MG 24 hr capsule Take 120 mg by mouth daily.     levothyroxine (SYNTHROID) 75 MCG tablet Take 75 mcg by mouth daily before breakfast.     Multiple Vitamins-Minerals (MULTIVITAMIN WITH MINERALS) tablet Take 1 tablet by mouth daily.     OVER THE COUNTER MEDICATION Take 1 capsule by mouth 2 (two) times daily. copaiba softgels     oxyCODONE (OXY IR/ROXICODONE) 5 MG immediate release tablet Take 1 tablet (5 mg total) by mouth every 4 (four) hours as needed for moderate pain. 10 tablet 0   PULMICORT FLEXHALER 180 MCG/ACT inhaler Inhale 1 puff into the lungs daily.     triamcinolone cream (KENALOG) 0.1 % Apply 1 Application topically daily as needed (Dry skin).     No current facility-administered medications for this visit.   Medication reconciliation is based on the patient's most recent medication list in the electronic medical record (EMR) including herbal products and OTC medications.   The patient's medication list was reviewed today with the patient? Yes   Drug-drug interactions (DDIs) DDIs were evaluated? Yes Significant DDIs identified? No   Drug-Food Interactions Drug-food interactions were evaluated? Yes Drug-food interactions identified? No   Follow-up Plan  Treatment start date: 05/25/23 Port placement date: 05/19/23 -- advised not to use EMLA cream for 1st cycle Dr. Pamelia Hoit may prescribe capecitabine in the adjuvant setting with pembrolizumab. Information given and possible side effects discussed We reviewed the prescriptions, premedications, and treatment regimen with the patient. Possible side effects of the treatment regimen were reviewed and management strategies were discussed Clinical pharmacy will assist Dr. [name] and [name] on an as needed basis going forward  [name] participated in the discussion, expressed understanding, and voiced agreement with the above plan. All questions were answered to her satisfaction. The patient was advised to contact the clinic at [phone] with any questions or concerns prior to her return visit.   I spent 60 minutes assessing the patient.  Birdella Sippel A. Odetta Pink, PharmD, BCOP, CPP  Anselm Lis,  RPH-CPP, 05/24/2023 3:26 PM  **Disclaimer: This note was dictated with voice recognition software. Similar sounding words can inadvertently be transcribed and this note may contain transcription errors which may not have been corrected upon publication of note.**

## 2023-05-25 ENCOUNTER — Encounter: Payer: Self-pay | Admitting: *Deleted

## 2023-05-25 ENCOUNTER — Telehealth: Payer: Self-pay

## 2023-05-25 ENCOUNTER — Other Ambulatory Visit: Payer: Self-pay

## 2023-05-25 ENCOUNTER — Other Ambulatory Visit (HOSPITAL_COMMUNITY): Payer: Self-pay

## 2023-05-25 ENCOUNTER — Inpatient Hospital Stay: Payer: BC Managed Care – PPO

## 2023-05-25 ENCOUNTER — Encounter: Payer: Self-pay | Admitting: Hematology and Oncology

## 2023-05-25 ENCOUNTER — Telehealth: Payer: Self-pay | Admitting: Pharmacy Technician

## 2023-05-25 ENCOUNTER — Inpatient Hospital Stay (HOSPITAL_BASED_OUTPATIENT_CLINIC_OR_DEPARTMENT_OTHER): Payer: BC Managed Care – PPO | Admitting: Hematology and Oncology

## 2023-05-25 VITALS — BP 138/73 | HR 78 | Temp 97.5°F | Resp 18 | Ht 63.0 in | Wt 147.0 lb

## 2023-05-25 VITALS — BP 128/70 | HR 80 | Temp 97.8°F | Resp 18

## 2023-05-25 DIAGNOSIS — K769 Liver disease, unspecified: Secondary | ICD-10-CM

## 2023-05-25 DIAGNOSIS — Z170 Estrogen receptor positive status [ER+]: Secondary | ICD-10-CM

## 2023-05-25 DIAGNOSIS — C50411 Malignant neoplasm of upper-outer quadrant of right female breast: Secondary | ICD-10-CM

## 2023-05-25 DIAGNOSIS — Z5112 Encounter for antineoplastic immunotherapy: Secondary | ICD-10-CM | POA: Diagnosis not present

## 2023-05-25 DIAGNOSIS — Z95828 Presence of other vascular implants and grafts: Secondary | ICD-10-CM

## 2023-05-25 LAB — CBC WITH DIFFERENTIAL (CANCER CENTER ONLY)
Abs Immature Granulocytes: 0.04 10*3/uL (ref 0.00–0.07)
Basophils Absolute: 0.1 10*3/uL (ref 0.0–0.1)
Basophils Relative: 1 %
Eosinophils Absolute: 0.5 10*3/uL (ref 0.0–0.5)
Eosinophils Relative: 10 %
HCT: 34.9 % — ABNORMAL LOW (ref 36.0–46.0)
Hemoglobin: 12.1 g/dL (ref 12.0–15.0)
Immature Granulocytes: 1 %
Lymphocytes Relative: 23 %
Lymphs Abs: 1.3 10*3/uL (ref 0.7–4.0)
MCH: 32.2 pg (ref 26.0–34.0)
MCHC: 34.7 g/dL (ref 30.0–36.0)
MCV: 92.8 fL (ref 80.0–100.0)
Monocytes Absolute: 0.6 10*3/uL (ref 0.1–1.0)
Monocytes Relative: 11 %
Neutro Abs: 3.1 10*3/uL (ref 1.7–7.7)
Neutrophils Relative %: 54 %
Platelet Count: 278 10*3/uL (ref 150–400)
RBC: 3.76 MIL/uL — ABNORMAL LOW (ref 3.87–5.11)
RDW: 13.1 % (ref 11.5–15.5)
WBC Count: 5.6 10*3/uL (ref 4.0–10.5)
nRBC: 0 % (ref 0.0–0.2)

## 2023-05-25 LAB — CMP (CANCER CENTER ONLY)
ALT: 22 U/L (ref 0–44)
AST: 16 U/L (ref 15–41)
Albumin: 4.1 g/dL (ref 3.5–5.0)
Alkaline Phosphatase: 67 U/L (ref 38–126)
Anion gap: 5 (ref 5–15)
BUN: 22 mg/dL (ref 8–23)
CO2: 28 mmol/L (ref 22–32)
Calcium: 9.3 mg/dL (ref 8.9–10.3)
Chloride: 107 mmol/L (ref 98–111)
Creatinine: 0.66 mg/dL (ref 0.44–1.00)
GFR, Estimated: >60 mL/min (ref >60)
Glucose, Bld: 91 mg/dL (ref 70–99)
Potassium: 4 mmol/L (ref 3.5–5.1)
Sodium: 140 mmol/L (ref 135–145)
Total Bilirubin: 0.3 mg/dL (ref 0.3–1.2)
Total Protein: 6.8 g/dL (ref 6.5–8.1)

## 2023-05-25 LAB — TSH: TSH: 3.149 u[IU]/mL (ref 0.350–4.500)

## 2023-05-25 MED ORDER — SODIUM CHLORIDE 0.9% FLUSH
10.0000 mL | INTRAVENOUS | Status: DC | PRN
  Administered 2023-05-25: 10 mL

## 2023-05-25 MED ORDER — ALPRAZOLAM 0.5 MG PO TABS: 0.5000 mg | ORAL_TABLET | Freq: Every evening | ORAL | 0 refills | Status: DC | PRN

## 2023-05-25 MED ORDER — CAPECITABINE 500 MG PO TABS
625.0000 mg/m2 | ORAL_TABLET | Freq: Two times a day (BID) | ORAL | 0 refills | Status: DC
  Filled 2023-05-25: qty 120, 30d supply, fill #0
  Filled 2023-06-02: qty 80, 28d supply, fill #0
  Filled 2023-06-20: qty 40, 10d supply, fill #1

## 2023-05-25 MED ORDER — SODIUM CHLORIDE 0.9 % IV SOLN: Freq: Once | INTRAVENOUS | Status: AC

## 2023-05-25 MED ORDER — HEPARIN SOD (PORK) LOCK FLUSH 100 UNIT/ML IV SOLN
500.0000 [IU] | Freq: Once | INTRAVENOUS | Status: AC | PRN
  Administered 2023-05-25: 500 [IU]

## 2023-05-25 MED ORDER — SODIUM CHLORIDE 0.9% FLUSH
10.0000 mL | Freq: Once | INTRAVENOUS | Status: AC
  Administered 2023-05-25: 10 mL

## 2023-05-25 MED ORDER — SODIUM CHLORIDE 0.9 % IV SOLN
200.0000 mg | Freq: Once | INTRAVENOUS | Status: AC
  Administered 2023-05-25: 200 mg via INTRAVENOUS
  Filled 2023-05-25: qty 200

## 2023-05-25 NOTE — Telephone Encounter (Signed)
Oral Oncology Patient Advocate Encounter  Prior Authorization for capecitabine has been approved.    PA# [id number] Effective dates: 05/25/23 through 05/24/24  Patients co-pay is $0.   Patient is limited to a max 30 day supply of medication per fill.  Jinger Neighbors, CPhT-Adv Oncology Pharmacy Patient Advocate [facility] Direct Number: [phone]  Fax: [phone]

## 2023-05-25 NOTE — Telephone Encounter (Signed)
Oral Oncology Patient Advocate Encounter   Received notification that prior authorization for capecitabine is required.   PA submitted on 05/25/23 Key [id number] Status is pending     Jinger Neighbors, CPhT-Adv Oncology Pharmacy Patient Advocate [facility] Direct Number: [phone]  Fax: [phone]

## 2023-05-25 NOTE — Telephone Encounter (Signed)
Oral Oncology Pharmacist Encounter  Received new prescription for Xeloda (capecitabine) for the treatment of triple negative breast cancer in conjunction with pembrolizumab and radiation, planned duration until disease progression or unacceptable toxicity..  Labs from 05/24/20 assessed, no interventions needed. Prescription dose and frequency assessed for appropriateness.   Current medication list in Epic reviewed, no significant/ relevant DDIs with Xeloda identified.  Evaluated chart and no patient barriers to medication adherence noted.   Patient agreement for treatment documented in MD note on 05/25/2023.  Prescription has been e-scribed to the [facility] for benefits analysis and approval.  Oral Oncology Clinic will continue to follow for insurance authorization, copayment issues, initial counseling and start date.  Bethel Born, PharmD Hematology/Oncology Clinical Pharmacist [facility] Oral Chemotherapy Navigation Clinic [phone] 05/25/2023 1:14 PM

## 2023-05-25 NOTE — Patient Instructions (Signed)
[facility]  Discharge Instructions: Thank you for choosing [facility] to provide your oncology and hematology care.   If you have a lab appointment with the Cancer Center, please go directly to the Cancer Center and check in at the registration area.   Wear comfortable clothing and clothing appropriate for easy access to any Portacath or PICC line.   We strive to give you quality time with your provider. You may need to reschedule your appointment if you arrive late (15 or more minutes).  Arriving late affects you and other patients whose appointments are after yours.  Also, if you miss three or more appointments without notifying the office, you may be dismissed from the clinic at the provider's discretion.      For prescription refill requests, have your pharmacy contact our office and allow 72 hours for refills to be completed.    Today you received the following chemotherapy and/or immunotherapy agents: Keytruda      To help prevent nausea and vomiting after your treatment, we encourage you to take your nausea medication as directed.  BELOW ARE SYMPTOMS THAT SHOULD BE REPORTED IMMEDIATELY: *FEVER GREATER THAN 100.4 F (38 C) OR HIGHER *CHILLS OR SWEATING *NAUSEA AND VOMITING THAT IS NOT CONTROLLED WITH YOUR NAUSEA MEDICATION *UNUSUAL SHORTNESS OF BREATH *UNUSUAL BRUISING OR BLEEDING *URINARY PROBLEMS (pain or burning when urinating, or frequent urination) *BOWEL PROBLEMS (unusual diarrhea, constipation, pain near the anus) TENDERNESS IN MOUTH AND THROAT WITH OR WITHOUT PRESENCE OF ULCERS (sore throat, sores in mouth, or a toothache) UNUSUAL RASH, SWELLING OR PAIN  UNUSUAL VAGINAL DISCHARGE OR ITCHING   Items with * indicate a potential emergency and should be followed up as soon as possible or go to the Emergency Department if any problems should occur.  Please show the CHEMOTHERAPY ALERT CARD or IMMUNOTHERAPY ALERT CARD at  check-in to the Emergency Department and triage nurse.  Should you have questions after your visit or need to cancel or reschedule your appointment, please contact [facility]  Dept: [phone]  and follow the prompts.  Office hours are 8:00 a.m. to 4:30 p.m. Monday - Friday. Please note that voicemails left after 4:00 p.m. may not be returned until the following business day.  We are closed weekends and major holidays. You have access to a nurse at all times for urgent questions. Please call the main number to the clinic Dept: [phone] and follow the prompts.   For any non-urgent questions, you may also contact your provider using MyChart. We now offer e-Visits for anyone 18 and older to request care online for non-urgent symptoms. For details visit [email or web address].   Also download the MyChart app! Go to the app store, search "MyChart", open the app, select [facility], and log in with your MyChart username and password.  Pembrolizumab Injection What is this medication? PEMBROLIZUMAB (PEM broe LIZ ue mab) treats some types of cancer. It works by helping your immune system slow or stop the spread of cancer cells. It is a monoclonal antibody. This medicine may be used for other purposes; ask your health care provider or pharmacist if you have questions. COMMON BRAND NAME(S): Keytruda What should I tell my care team before I take this medication? They need to know if you have any of these conditions: Allogeneic stem cell transplant (uses someone else's stem cells) Autoimmune diseases, such as Crohn disease, ulcerative colitis, lupus History of chest radiation Nervous   system problems, such as Guillain-Barre syndrome, myasthenia gravis Organ transplant An unusual or allergic reaction to pembrolizumab, other medications, foods, dyes, or preservatives Pregnant or trying to get pregnant Breast-feeding How should I use this medication? This  medication is injected into a vein. It is given by your care team in a hospital or clinic setting. A special MedGuide will be given to you before each treatment. Be sure to read this information carefully each time. Talk to your care team about the use of this medication in children. While it may be prescribed for children as young as 6 months for selected conditions, precautions do apply. Overdosage: If you think you have taken too much of this medicine contact a poison control center or emergency room at once. NOTE: This medicine is only for you. Do not share this medicine with others. What if I miss a dose? Keep appointments for follow-up doses. It is important not to miss your dose. Call your care team if you are unable to keep an appointment. What may interact with this medication? Interactions have not been studied. This list may not describe all possible interactions. Give your health care provider a list of all the medicines, herbs, non-prescription drugs, or dietary supplements you use. Also tell them if you smoke, drink alcohol, or use illegal drugs. Some items may interact with your medicine. What should I watch for while using this medication? Your condition will be monitored carefully while you are receiving this medication. You may need blood work while taking this medication. This medication may cause serious skin reactions. They can happen weeks to months after starting the medication. Contact your care team right away if you notice fevers or flu-like symptoms with a rash. The rash may be red or purple and then turn into blisters or peeling of the skin. You may also notice a red rash with swelling of the face, lips, or lymph nodes in your neck or under your arms. Tell your care team right away if you have any change in your eyesight. Talk to your care team if you may be pregnant. Serious birth defects can occur if you take this medication during pregnancy and for 4 months after the last  dose. You will need a negative pregnancy test before starting this medication. Contraception is recommended while taking this medication and for 4 months after the last dose. Your care team can help you find the option that works for you. Do not breastfeed while taking this medication and for 4 months after the last dose. What side effects may I notice from receiving this medication? Side effects that you should report to your care team as soon as possible: Allergic reactions--skin rash, itching, hives, swelling of the face, lips, tongue, or throat Dry cough, shortness of breath or trouble breathing Eye pain, redness, irritation, or discharge with blurry or decreased vision Heart muscle inflammation--unusual weakness or fatigue, shortness of breath, chest pain, fast or irregular heartbeat, dizziness, swelling of the ankles, feet, or hands Hormone gland problems--headache, sensitivity to light, unusual weakness or fatigue, dizziness, fast or irregular heartbeat, increased sensitivity to cold or heat, excessive sweating, constipation, hair loss, increased thirst or amount of urine, tremors or shaking, irritability Infusion reactions--chest pain, shortness of breath or trouble breathing, feeling faint or lightheaded Kidney injury (glomerulonephritis)--decrease in the amount of urine, red or dark brown urine, foamy or bubbly urine, swelling of the ankles, hands, or feet Liver injury--right upper belly pain, loss of appetite, nausea, light-colored stool, dark yellow or   brown urine, yellowing skin or eyes, unusual weakness or fatigue Pain, tingling, or numbness in the hands or feet, muscle weakness, change in vision, confusion or trouble speaking, loss of balance or coordination, trouble walking, seizures Rash, fever, and swollen lymph nodes Redness, blistering, peeling, or loosening of the skin, including inside the mouth Sudden or severe stomach pain, bloody diarrhea, fever, nausea, vomiting Side effects  that usually do not require medical attention (report to your care team if they continue or are bothersome): Bone, joint, or muscle pain Diarrhea Fatigue Loss of appetite Nausea Skin rash This list may not describe all possible side effects. Call your doctor for medical advice about side effects. You may report side effects to FDA at 1-800-FDA-1088. Where should I keep my medication? This medication is given in a hospital or clinic. It will not be stored at home. NOTE: This sheet is a summary. It may not cover all possible information. If you have questions about this medicine, talk to your doctor, pharmacist, or health care provider.  2024 Elsevier/Gold Standard (2022-02-02 00:00:00)  

## 2023-05-25 NOTE — Progress Notes (Signed)
Patient Care Team: [name], MD as PCP - General (Unknown Physician Specialty) Pershing Proud, RN as Oncology Nurse Navigator Donnelly Angelica, RN as Oncology Nurse Navigator [name], MD as Consulting Physician (Hematology and Oncology) Antony Blackbird, MD as Consulting Physician (Radiation Oncology) [name], MD as Consulting Physician (General Surgery)  DIAGNOSIS:  Encounter Diagnoses  Name Primary?   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female, estrogen receptor positive (HCC) Yes   Liver disease     SUMMARY OF ONCOLOGIC HISTORY: Oncology History  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female, estrogen receptor positive (HCC)  09/15/2022 Initial Diagnosis   Screening mammogram detected right breast mass which measured 1.4 cm by ultrasound at 10 o'clock position, biopsy revealed grade 3 IDC with lymphovascular invasion ER 10%, PR 0%, Ki-67 30%, HER2 0 negative   09/25/2022 Cancer Staging   Staging form: Breast, AJCC 8th Edition - Clinical: Stage IB (cT1c, cN0, cM0, G3, ER+, PR-, HER2-) - Signed by [name], MD on 09/25/2022 Histologic grading system: 3 grade system    Genetic Testing   [facility] CancerNext-Expanded Panel+RNA was Negative. Report date is 10/06/2021.  The CancerNext-Expanded gene panel offered by [facility] and includes sequencing, rearrangement, and RNA analysis for the following 77 genes: AIP, ALK, APC, ATM, AXIN2, BAP1, BARD1, BLM, BMPR1A, BRCA1, BRCA2, BRIP1, CDC73, CDH1, CDK4, CDKN1B, CDKN2A, CHEK2, CTNNA1, DICER1, FANCC, FH, FLCN, GALNT12, KIF1B, LZTR1, MAX, MEN1, MET, MLH1, MSH2, MSH3, MSH6, MUTYH, NBN, NF1, NF2, NTHL1, PALB2, PHOX2B, PMS2, POT1, PRKAR1A, PTCH1, PTEN, RAD51C, RAD51D, RB1, RECQL, RET, SDHA, SDHAF2, SDHB, SDHC, SDHD, SMAD4, SMARCA4, SMARCB1, SMARCE1, STK11, SUFU, TMEM127, TP53, TSC1, TSC2, VHL and XRCC2 (sequencing and deletion/duplication); EGFR, EGLN1, HOXB13, KIT, MITF,  PDGFRA, POLD1, and POLE (sequencing only); EPCAM and GREM1 (deletion/duplication only).    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 with high-grade DCIS, inferior margin positive, angiolymphatic invasion present, 5/5 lymph nodes positive, additional medial margin: Poorly differentiated carcinoma with lymphoid stroma, additional inferior margin: Positive, ER 10% weak, PR 0%, HER2 0, Ki-67 30%   10/21/2022 Cancer Staging   Staging form: Breast, AJCC 8th Edition - Pathologic: Stage IIIC (pT1c, pN2a, cM0, G3, ER-, PR-, HER2-) - Signed by [name], MD on 10/21/2022 Stage prefix: Initial diagnosis Histologic grading system: 3 grade system   11/08/2022 Surgery   Margin reexcision: Inferior margin: Grade 3 IDC, new margin positive Medial margin: Grade 3 IDC new margin widely involved   11/18/2022 Surgery   Right inferior margin reexcision: Invasive poorly differentiated adenocarcinoma grade 3 present in new inferior margin.  Medial margin reexcision: Invasive poorly differentiated adenocarcinoma grade 3 present at new medial margin, posterior margin: Poorly differentiated adenocarcinoma grade 3, margin negative  11/08/2022 and 11/18/2022: Final margin focal positivity. After much discussion the plan is to finish her systemic treatment and come back at a later point to do mastectomy.    12/02/2022 - 04/18/2023 Chemotherapy   Patient is on Treatment Plan : BREAST ADJUVANT DOSE DENSE AC q14d / PACLitaxel q7d     05/03/2023 Surgery   Right mastectomy: 8 cm grade 3 IDC with extensive lymphatic and vascular invasion RCB class III, deep margin multifocally positive, 5/5 lymph nodes positive, ER weak 10%, PR 0%, HER2 0, Ki-67 30%   05/25/2023 -  Chemotherapy   Patient is on Treatment Plan : BREAST Pembrolizumab (200) q21d x 24 months  CHIEF COMPLIANT: Pembrolizumab maintenance   INTERVAL HISTORY: Caitlin [name] is a 67 y.o. female is here because of above-mentioned history of breast cancer who underwent initially lumpectomy that revealed extensive positive margins and residual disease for triple negative breast cancer, she underwent neoadjuvant chemotherapy with dose dense Adriamycin and Cytoxan followed by Taxol. She presents to the clinic for a follow-up to start adjuvant immunotherapy. Denies any rib fracture that she is aware of in the past.  She is also here to discuss the results of the CT scans and bone scans.  ALLERGIES:  is allergic to penicillins.  MEDICATIONS:  Current Outpatient Medications  Medication Sig Dispense Refill   acetaminophen (TYLENOL) 500 MG tablet Take 500 mg by mouth every 6 (six) hours as needed for moderate pain.     albuterol (VENTOLIN HFA) 108 (90 Base) MCG/ACT inhaler Inhale 2 puffs into the lungs every 4 (four) hours as needed for shortness of breath (Asthma).     aspirin 81 MG tablet Take 81 mg by mouth daily.       benzonatate (TESSALON) 200 MG capsule Take 200 mg by mouth daily as needed for cough.     capecitabine (XELODA) 500 MG tablet Take 2 tablets (1,000 mg total) by mouth 2 (two) times daily after a meal. Monday-Friday on days of radiation 120 tablet 0   diltiazem (CARDIZEM CD) 120 MG 24 hr capsule Take 120 mg by mouth daily.     levothyroxine (SYNTHROID) 75 MCG tablet Take 75 mcg by mouth daily before breakfast.     Multiple Vitamins-Minerals (MULTIVITAMIN WITH MINERALS) tablet Take 1 tablet by mouth daily.     OVER THE COUNTER MEDICATION Take 1 capsule by mouth 2 (two) times daily. copaiba softgels     oxyCODONE (OXY IR/ROXICODONE) 5 MG immediate release tablet Take 1 tablet (5 mg total) by mouth every 4 (four) hours as needed for moderate pain. 10 tablet 0   PULMICORT FLEXHALER 180 MCG/ACT inhaler Inhale 1 puff into the lungs daily.     triamcinolone cream (KENALOG) 0.1 % Apply 1 Application topically daily as needed (Dry skin).     No current  facility-administered medications for this visit.   Facility-Administered Medications Ordered in Other Visits  Medication Dose Route Frequency Provider Last Rate Last Admin   0.9 %  sodium chloride infusion   Intravenous Once [name], MD       heparin lock flush 100 unit/mL  500 Units Intracatheter Once PRN [name], MD       pembrolizumab [facility]) 200 mg in sodium chloride 0.9 % 50 mL chemo infusion  200 mg Intravenous Once [name], MD       sodium chloride flush (NS) 0.9 % injection 10 mL  10 mL Intracatheter PRN [name], MD        PHYSICAL EXAMINATION: ECOG PERFORMANCE STATUS: 1 - Symptomatic but completely ambulatory  Vitals:   05/25/23 0927  BP: 138/73  Pulse: 78  Resp: 18  Temp: (!) 97.5 F (36.4 C)  SpO2: 98%   Filed Weights   05/25/23 0927  Weight: 147 lb (66.7 kg)      LABORATORY DATA:  I have reviewed the data as listed    Latest Ref Rng & Units 05/25/2023    8:56 AM 04/18/2023    7:32 AM 04/11/2023    7:40 AM  CMP  Glucose 70 - 99 mg/dL 91  89  83   BUN 8 - 23 mg/dL 22  14  14   Creatinine 0.44 - 1.00 mg/dL 8.29  5.62  1.30   Sodium 135 - 145 mmol/L 140  140  140   Potassium 3.5 - 5.1 mmol/L 4.0  3.7  3.8   Chloride 98 - 111 mmol/L 107  107  107   CO2 22 - 32 mmol/L 28  25  26    Calcium 8.9 - 10.3 mg/dL 9.3  9.1  9.5   Total Protein 6.5 - 8.1 g/dL 6.8  6.5  6.7   Total Bilirubin 0.3 - 1.2 mg/dL 0.3  0.4  0.3   Alkaline Phos 38 - 126 U/L 67  63  78   AST 15 - 41 U/L 16  15  15    ALT 0 - 44 U/L 22  11  11      Lab Results  Component Value Date   WBC 5.6 05/25/2023   HGB 12.1 05/25/2023   HCT 34.9 (L) 05/25/2023   MCV 92.8 05/25/2023   PLT 278 05/25/2023   NEUTROABS 3.1 05/25/2023    ASSESSMENT & PLAN:  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female, estrogen receptor positive (HCC) 10/14/2022:Right lumpectomy: 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  No distant metastatic disease. Bone scan 10/29/2022: No evidence of bone metastasis   11/08/2022 and 11/18/2022: Margin reexcision surgeries: Final margin focal positivity.  After much discussion the plan is to finish her systemic treatment and come back at a later point to do mastectomy.   Treatment plan: Adjuvant chemotherapy with dose dense Adriamycin and Cytoxan followed by Taxol to be completed 05/02/2023 Mastectomy by Dr. Dwain Sarna 05/03/2023:Right mastectomy: 8 cm grade 3 IDC with extensive lymphatic and vascular invasion RCB class III, deep margin multifocally positive, 5/5 lymph nodes positive, ER weak 10%, PR 0%, HER2 0, Ki-67 30%, total lymph nodes: 10/10 (prior lumpectomy she had 5 positive nodes) Adjuvant radiation ------------------------------------------------------------------------------------------------------------------------------------------------ Treatment plan: Based upon extensive amount of residual breast cancer following neoadjuvant chemotherapy with Adriamycin Cytoxan and Taxol, I recommended Keytruda with capecitabine.  CT CAP 05/22/2023: Similar to slightly decreased size of right axillary and subpectoral lymph nodes few prominent supraclavicular nodes increased or new (nonspecific versus metastatic), liver hypodensity indeterminate sclerosis left ninth rib fracture Bone scan 05/22/2023: Left ninth rib fracture otherwise negative I discussed with her that the supraclavicular lymph nodes are below the level of  detection.  Therefore I do not see any benefit of PET scan.  However we will obtain an MRI of the liver for assessment of the liver findings.  Current treatment: Pembrolizumab maintenance every 3 weeks with capecitabine I discussed with her that she will be taking capecitabine Monday through Friday 2 tablets in the morning 2 tablets in the evening with radiation.  Once radiation is completed then she will switch to capecitabine the same dosage 14 days on and 7 days off.  I will see her next week to assess tolerance to pembrolizumab.  Orders Placed This Encounter  Procedures   MR LIVER W WO CONTRAST    Standing Status:   Future  Standing Expiration Date:   05/24/2024    Order Specific Question:   If indicated for the ordered procedure, I authorize the administration of contrast media per Radiology protocol    Answer:   Yes    Order Specific Question:   What is the patient's sedation requirement?    Answer:   No Sedation    Order Specific Question:   Does the patient have a pacemaker or implanted devices?    Answer:   No    Order Specific Question:   Release to patient    Answer:   Immediate    Order Specific Question:   Preferred imaging location?    Answer:   [facility] (table limit-550lbs)   CBC with Differential (Cancer Center Only)    Standing Status:   Future    Standing Expiration Date:   07/26/2024   CMP (Cancer Center only)    Standing Status:   Future    Standing Expiration Date:   07/26/2024   CBC with Differential (Cancer Center Only)    Standing Status:   Future    Standing Expiration Date:   08/16/2024   CMP (Cancer Center only)    Standing Status:   Future    Standing Expiration Date:   08/16/2024   CBC with Differential (Cancer Center Only)    Standing Status:   Future    Standing Expiration Date:   09/06/2024   CMP (Cancer Center only)    Standing Status:   Future    Standing Expiration Date:   09/06/2024   T4    Standing Status:   Future    Standing  Expiration Date:   09/06/2024   TSH    Standing Status:   Future    Standing Expiration Date:   09/06/2024   CBC with Differential (Cancer Center Only)    Standing Status:   Future    Standing Expiration Date:   09/27/2024   CMP (Cancer Center only)    Standing Status:   Future    Standing Expiration Date:   09/27/2024   The patient has a good understanding of the overall plan. she agrees with it. she will call with any problems that may develop before the next visit here. Total time spent: 30 mins including face to face time and time spent for planning, charting and co-ordination of care   Tamsen Meek, MD 05/25/23    I Janan Ridge am acting as a Neurosurgeon for [employer]  I have reviewed the above documentation for accuracy and completeness, and I agree with the above.

## 2023-05-25 NOTE — Assessment & Plan Note (Signed)
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  No distant metastatic disease. Bone scan 10/29/2022: No evidence of bone metastasis   11/08/2022 and 11/18/2022: Margin reexcision surgeries: Final margin focal positivity.  After much discussion the plan is to finish her systemic treatment and come back at a later point to do mastectomy.   Treatment plan: Adjuvant chemotherapy with dose dense Adriamycin and Cytoxan followed by Taxol to be completed 05/02/2023 Mastectomy by Dr. Dwain Sarna 05/03/2023:Right mastectomy: 8 cm grade 3 IDC with extensive lymphatic and vascular invasion RCB class III, deep margin multifocally positive, 5/5 lymph nodes positive, ER weak 10%, PR 0%, HER2 0, Ki-67 30%, total lymph nodes: 10/10 (prior lumpectomy she had 5 positive nodes) Adjuvant radiation Very little to mild benefit from antiestrogen therapy and therefore we decided that she would not need to take it. ------------------------------------------------------------------------------------------------------------------------------------------------ Treatment plan: Based upon extensive amount of residual breast cancer following neoadjuvant chemotherapy with Adriamycin Cytoxan and Taxol, I recommended Keytruda with capecitabine.  CT CAP 05/22/2023: Similar to slightly decreased size of right axillary and subpectoral lymph nodes few prominent supraclavicular nodes increased or new (nonspecific  versus metastatic), liver hypodensity indeterminate sclerosis left ninth rib fracture Bone scan 05/22/2023: Left ninth rib fracture otherwise negative  Current treatment: Pembrolizumab maintenance every 3 weeks with capecitabine

## 2023-05-26 ENCOUNTER — Other Ambulatory Visit: Payer: Self-pay

## 2023-05-26 ENCOUNTER — Encounter: Payer: Self-pay | Admitting: Hematology and Oncology

## 2023-05-26 LAB — T4: T4, Total: 7.8 ug/dL (ref 4.5–12.0)

## 2023-05-27 ENCOUNTER — Encounter: Payer: Self-pay | Admitting: Radiation Oncology

## 2023-05-27 ENCOUNTER — Telehealth: Payer: Self-pay

## 2023-05-27 ENCOUNTER — Other Ambulatory Visit: Payer: Self-pay

## 2023-05-27 NOTE — Telephone Encounter (Signed)
RN left message for pt after failed attempt to reach her to obtain pre consult information. Call back information left with message. Rn will call back later on today as well.

## 2023-05-27 NOTE — Telephone Encounter (Signed)
Pt called RN back. Rn was able to obtain pre FUN information. Note completed and routed to Dr. Roselind Messier.

## 2023-05-28 NOTE — Progress Notes (Incomplete)
Radiation Oncology         [phone] ________________________________  Name: Caitlin [name] MRN: [id number]  Date: 05/30/2023  DOB: April 10, 1956  Re-Evaluation Note  CC: [name], MD  [name], MD  No diagnosis found.  Diagnosis: The encounter diagnosis was 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female, estrogen receptor positive (HCC).   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 She was seen in consultation on 09/29/22.   {If genetic testing was done:}Since consultation, she underwent genetic testing on ***. Results showed ***.  She opted to proceed with a right breast lumpectomy with right axillary SLN biopsies and port placement on 10/14/22 under the care of Dr. Dwain Sarna Pathology from the procedure revealed: tumor the size of 1.4 cm; histology of grade 3 invasive ductal carcinoma with focal lobular features and focal high-grade DCIS; inferior margin positive for invasive carcinoma (second closest margin to invasive disease of 0.3 mm from the medial margin); all margins negative for DCIS; nodal status of 5/5 right axillary SLN excisions positive for carcinoma. Prognostic indicators significant for: estrogen receptor 0% negative; progesterone receptor 0% negative; Proliferation marker Ki67 at 40%; Her2 status negative; Grade 3.   Accordingly, the patient underwent re-excision of the positive inferior margin and close medial margin on 11/08/22. Pathology showed residual carcinoma in both inferior and medial margins.   She again underwent re-excision on 11/18/22. However, pathology again showed invasive carcinoma in the inferior and medial margins. The posterior margin was also excised and initially showed  focal invasive carcinoma; final margin to invasive disease of 3.5 mm.    She has been treated with adjuvant chemotherapy consisting of Adriamycin and Cytoxan from 12/02/22 through *** under the care of Dr. Pamelia Hoit. This was followed by taxol which she completed on 05/02/23. Chemo toxicities encountered by the patient throughout the course of systemic treatment included: alopecia, weight loss, fatigue, leukopenia, and chemo-induced anemia. She also came down with pneumonia which resulted in treatment being held on 01/31/23 so that she could complete a course of antibiotics.      Based on Dr. Doreen Salvage recommendation, the patient proceeded with a right mastectomy and port removal on 05/03/23. Pathology from the procedure revealed: invasive ductal carcinoma measuring 8 cm in the greatest linear extent; deep margin multifocally positive for carcinoma;   On review of systems, the patient reports ***. She denies *** and any other symptoms.    Allergies:  is allergic to penicillins.  Meds: Current Outpatient Medications  Medication Sig Dispense Refill   acetaminophen (TYLENOL) 500 MG tablet Take 500 mg by mouth every 6 (six) hours as needed for moderate pain.     albuterol (VENTOLIN HFA) 108 (90 Base) MCG/ACT inhaler Inhale 2 puffs into the lungs every 4 (four) hours as needed for shortness of breath (Asthma).     ALPRAZolam (XANAX) 0.5 MG tablet Take 1 tablet (0.5 mg total) by mouth at bedtime as needed for anxiety. Take 1 tab before the MRI for anxiety. 10 tablet 0   aspirin 81 MG tablet Take 81 mg by mouth daily.       benzonatate (TESSALON) 200 MG capsule Take 200 mg by mouth daily as needed for cough.     capecitabine (XELODA) 500  MG tablet Take 2 tablets (1,000 mg total) by mouth 2 (two) times daily after a meal. Monday-Friday on days of radiation 120 tablet 0   diltiazem (CARDIZEM CD) 120 MG 24 hr capsule Take 120 mg by mouth daily.     levothyroxine (SYNTHROID) 75 MCG tablet Take 75 mcg by  mouth daily before breakfast.     Multiple Vitamins-Minerals (MULTIVITAMIN WITH MINERALS) tablet Take 1 tablet by mouth daily.     OVER THE COUNTER MEDICATION Take 1 capsule by mouth 2 (two) times daily. copaiba softgels     oxyCODONE (OXY IR/ROXICODONE) 5 MG immediate release tablet Take 1 tablet (5 mg total) by mouth every 4 (four) hours as needed for moderate pain. 10 tablet 0   PULMICORT FLEXHALER 180 MCG/ACT inhaler Inhale 1 puff into the lungs daily.     triamcinolone cream (KENALOG) 0.1 % Apply 1 Application topically daily as needed (Dry skin).     No current facility-administered medications for this encounter.    Physical Findings: The patient is in no acute distress. Patient is alert and oriented.  vitals were not taken for this visit.  No significant changes. Lungs are clear to auscultation bilaterally. Heart has regular rate and rhythm. No palpable cervical, supraclavicular, or axillary adenopathy. Abdomen soft, non-tender, normal bowel sounds. *** Breast: no palpable mass, nipple discharge or bleeding. *** Breast: ***  Lab Findings: Lab Results  Component Value Date   WBC 5.6 05/25/2023   HGB 12.1 05/25/2023   HCT 34.9 (L) 05/25/2023   MCV 92.8 05/25/2023   PLT 278 05/25/2023    Radiographic Findings: NM Bone Scan Whole Body  Result Date: 05/22/2023 CLINICAL DATA:  History of breast cancer staging/follow-up EXAM: NUCLEAR MEDICINE WHOLE BODY BONE SCAN TECHNIQUE: Whole body anterior and posterior images were obtained approximately 3 hours after intravenous injection of radiopharmaceutical. RADIOPHARMACEUTICALS:  Twenty-one mCi Technetium-24m MDP IV COMPARISON:  Same day chest CT and nuclear medicine bone scan October 29, 2022 FINDINGS: Focus of abnormal radiotracer in the left posterolateral ninth rib corresponding with a subtle sclerosis and a nondisplaced fracture seen on same day CT No other scintigraphic evidence of abnormal radiotracer uptake to suggest osteoblastic  metastatic disease. Multifocal radiotracer uptake involving the elbows, wrists, spine, shoulders, knees, ankles and feet in a pattern most consistent with degenerative arthropathy. Otherwise physiologic distribution of radiotracer activity. IMPRESSION: 1. Focus of abnormal radiotracer uptake in the left posterolateral ninth rib corresponds with a fracture and sclerosis seen on same day chest CT. 2. No other scintigraphic evidence of abnormal radiotracer uptake to suggest osteoblastic metastatic disease Electronically Signed   By: Maudry Mayhew M.D.   On: 05/22/2023 10:40   CT CHEST ABDOMEN PELVIS W CONTRAST  Result Date: 05/22/2023 CLINICAL DATA:  History of invasive metastatic breast cancer follow-up/assess treatment response. * Tracking Code: BO * EXAM: CT CHEST, ABDOMEN, AND PELVIS WITH CONTRAST TECHNIQUE: Multidetector CT imaging of the chest, abdomen and pelvis was performed following the standard protocol during bolus administration of intravenous contrast. RADIATION DOSE REDUCTION: This exam was performed according to the departmental dose-optimization program which includes automated exposure control, adjustment of the mA and/or kV according to patient size and/or use of iterative reconstruction technique. CONTRAST:  OMNIPAQUE IOHEXOL 300 MG/ML  SOLN COMPARISON:  Multiple priors including most recent CT October 29, 2022. FINDINGS: CT CHEST FINDINGS Cardiovascular: Accessed left chest Port-A-Cath with tip at the superior cavoatrial junction. Normal caliber thoracic aorta. No central pulmonary embolus on this nondedicated study. Normal size heart.  No significant pericardial effusion/thickening. Mediastinum/Nodes: No suspicious thyroid nodule. Overall similar to slightly decreased size of the right axillary and subpectoral lymph nodes. For reference: -right axillary lymph node now measures 7 mm in short axis on image 7/2 previously 13 mm There are a few prominent supraclavicular lymph nodes which are  increased or new from prior. For reference: -A supraclavicular lymph node on image 3/2 measuring 5 mm in short axis not definitely seen on prior examination -a supraclavicular lymph node near the sternoclavicular junction with preserved fatty hilum now measures 9 mm on image 8/2 common previously 4 mm. Left periaortic lymph node at the thoracic inlet measures 7 mm in short axis on image 13/2, unchanged when remeasured for consistency. The esophagus is grossly unremarkable Lungs/Pleura: No suspicious pulmonary nodules or masses. Multifocal scarring/atelectasis predominantly involving the lung bases. No pleural effusion. No pneumothorax. Musculoskeletal: Prior right mastectomy and right axillary lymph node dissection with associated surgical change including a small fluid collection in the lateral breast/axilla on image 29/2 compatible with a seroma. Subtle sclerosis about a nondisplaced posterolateral left ninth rib fracture on image 112/9 with corresponding abnormal radiotracer on today's nuclear medicine bone scan. CT ABDOMEN PELVIS FINDINGS Hepatobiliary: Benign cysts in the left lobe of the liver. Subtle hypodensity along the inferior aspect of segment VI on image 79/2 previously measured 4 mm. Adjacent tiny hepatic hypodensity measuring 4 mm on image 74/2 is unchanged from prior. Small hepatic hypodensity in segment VII with questionable peripheral enhancement best seen on coronal image 74/5 measuring 14 mm. Gallbladder is unremarkable. No biliary ductal dilation. Pancreas: No pancreatic ductal dilation or evidence of acute inflammation. Spleen: No splenomegaly. Adrenals/Urinary Tract: Bilateral adrenal glands appear normal. Nonobstructive left renal stone measures 5 mm no suspicious renal mass. Kidneys demonstrate symmetric enhancement. Urinary bladder is unremarkable for degree of distension Stomach/Bowel: Stomach is unremarkable for degree of distension. No pathologic dilation of small or large bowel.  Postsurgical change along the base of the cecum. Vascular/Lymphatic: Aortic atherosclerosis. Normal caliber abdominal aorta. Smooth IVC contours no pathologically enlarged abdominal or pelvic lymph nodes Reproductive: Uterus and bilateral adnexa are unremarkable. Other: No significant abdominopelvic free fluid Musculoskeletal: S shaped curvature of the thoracolumbar spine with associated degenerative change. L3 vertebral body hemangioma. No aggressive lytic or blastic lesion of bone. IMPRESSION: 1. Overall similar to slightly decreased size of the right axillary and subpectoral lymph nodes, but with a few prominent supraclavicular lymph nodes which are increased or new from prior examination, nonspecific and may be reactive or metastatic. Recommend attention on follow-up. 2. Subtle hypodensity along the inferior aspect of segment VI previously measured 4 mm and a 14 mm hepatic hypodensity in segment VII with questionable peripheral enhancement, indeterminate. Recommend more definitive assessment by hepatic protocol MRI with and without contrast 3. Subtle sclerosis about a nondisplaced posterolateral left ninth rib fracture with corresponding abnormal radiotracer on today's nuclear medicine bone scan, favored to reflect a healing nondisplaced fracture. 4. Nonobstructive left renal stone measures 5 mm. 5.  Aortic Atherosclerosis (ICD10-I70.0). Electronically Signed   By: Maudry Mayhew M.D.   On: 05/22/2023 10:38   DG CHEST PORT 1 VIEW  Result Date: 05/19/2023 CLINICAL DATA:  [id number] Port-A-Cath in place [id number]. Postoperative evaluation of Port-A-Cath placement. EXAM: PORTABLE CHEST 1 VIEW COMPARISON:  01/26/2023. FINDINGS: Left chest Port-A-Cath tip projects over the superior cavoatrial junction. Low lung volumes without consolidation or pulmonary edema. Stable cardiac and mediastinal contours with exaggerated dextroscoliotic curvature of the midthoracic spine. No pleural effusion or  pneumothorax. IMPRESSION: Left  chest Port-A-Cath tip projects over the superior cavoatrial junction. No pneumothorax. Electronically Signed   By: Orvan Falconer M.D.   On: 05/19/2023 08:59   DG C-Arm 1-60 Min-No Report  Result Date: 05/19/2023 Fluoroscopy was utilized by the requesting physician.  No radiographic interpretation.    Impression:  {diagnosis}  ***  Plan:  Patient is scheduled for CT simulation {date/later today}. ***  -----------------------------------  Billie Lade, PhD, MD  This document serves as a record of services personally performed by Antony Blackbird, MD. It was created on his behalf by Neena Rhymes, a trained medical scribe. The creation of this record is based on the scribe's personal observations and the provider's statements to them. This document has been checked and approved by the attending provider.

## 2023-05-30 ENCOUNTER — Ambulatory Visit
Admission: RE | Admit: 2023-05-30 | Discharge: 2023-05-30 | Disposition: A | Discharge status: Home / Self Care | Payer: BC Managed Care – PPO | Source: Ambulatory Visit | Attending: Radiation Oncology | Admitting: Radiation Oncology

## 2023-05-30 ENCOUNTER — Ambulatory Visit: Payer: BC Managed Care – PPO | Admitting: Radiation Oncology

## 2023-05-30 ENCOUNTER — Ambulatory Visit: Payer: BC Managed Care – PPO

## 2023-05-30 ENCOUNTER — Encounter: Payer: Self-pay | Admitting: Radiation Oncology

## 2023-05-30 VITALS — BP 128/88 | HR 71 | Temp 97.7°F | Resp 18 | Ht 63.0 in | Wt 147.5 lb

## 2023-05-30 DIAGNOSIS — Z7982 Long term (current) use of aspirin: Secondary | ICD-10-CM | POA: Insufficient documentation

## 2023-05-30 DIAGNOSIS — Z170 Estrogen receptor positive status [ER+]: Secondary | ICD-10-CM | POA: Diagnosis not present

## 2023-05-30 DIAGNOSIS — N200 Calculus of kidney: Secondary | ICD-10-CM | POA: Insufficient documentation

## 2023-05-30 DIAGNOSIS — C50411 Malignant neoplasm of upper-outer quadrant of right female breast: Secondary | ICD-10-CM | POA: Diagnosis present

## 2023-05-30 DIAGNOSIS — Z9221 Personal history of antineoplastic chemotherapy: Secondary | ICD-10-CM | POA: Insufficient documentation

## 2023-05-30 DIAGNOSIS — Z9011 Acquired absence of right breast and nipple: Secondary | ICD-10-CM | POA: Diagnosis not present

## 2023-05-30 DIAGNOSIS — Z7951 Long term (current) use of inhaled steroids: Secondary | ICD-10-CM | POA: Diagnosis not present

## 2023-05-30 DIAGNOSIS — S2232XA Fracture of one rib, left side, initial encounter for closed fracture: Secondary | ICD-10-CM | POA: Diagnosis not present

## 2023-05-30 DIAGNOSIS — Z79890 Hormone replacement therapy (postmenopausal): Secondary | ICD-10-CM | POA: Insufficient documentation

## 2023-05-30 DIAGNOSIS — Z79899 Other long term (current) drug therapy: Secondary | ICD-10-CM | POA: Insufficient documentation

## 2023-05-30 DIAGNOSIS — I700 Atherosclerosis of aorta: Secondary | ICD-10-CM | POA: Insufficient documentation

## 2023-05-31 ENCOUNTER — Other Ambulatory Visit: Payer: Self-pay

## 2023-05-31 ENCOUNTER — Encounter: Payer: Self-pay | Admitting: *Deleted

## 2023-05-31 DIAGNOSIS — C50411 Malignant neoplasm of upper-outer quadrant of right female breast: Secondary | ICD-10-CM

## 2023-06-01 ENCOUNTER — Ambulatory Visit: Payer: BC Managed Care – PPO | Attending: Radiation Oncology | Admitting: Physical Therapy

## 2023-06-01 ENCOUNTER — Encounter: Payer: Self-pay | Admitting: Physical Therapy

## 2023-06-01 ENCOUNTER — Inpatient Hospital Stay (HOSPITAL_BASED_OUTPATIENT_CLINIC_OR_DEPARTMENT_OTHER): Payer: BC Managed Care – PPO | Admitting: Hematology and Oncology

## 2023-06-01 ENCOUNTER — Inpatient Hospital Stay: Payer: BC Managed Care – PPO

## 2023-06-01 ENCOUNTER — Other Ambulatory Visit: Payer: Self-pay

## 2023-06-01 VITALS — BP 153/83 | HR 79 | Temp 97.2°F | Resp 18 | Ht 63.0 in | Wt 148.4 lb

## 2023-06-01 DIAGNOSIS — Z5112 Encounter for antineoplastic immunotherapy: Secondary | ICD-10-CM | POA: Diagnosis not present

## 2023-06-01 DIAGNOSIS — R293 Abnormal posture: Secondary | ICD-10-CM | POA: Diagnosis present

## 2023-06-01 DIAGNOSIS — Z95828 Presence of other vascular implants and grafts: Secondary | ICD-10-CM

## 2023-06-01 DIAGNOSIS — M25611 Stiffness of right shoulder, not elsewhere classified: Secondary | ICD-10-CM | POA: Diagnosis present

## 2023-06-01 DIAGNOSIS — C50411 Malignant neoplasm of upper-outer quadrant of right female breast: Secondary | ICD-10-CM

## 2023-06-01 DIAGNOSIS — Z170 Estrogen receptor positive status [ER+]: Secondary | ICD-10-CM

## 2023-06-01 DIAGNOSIS — Z483 Aftercare following surgery for neoplasm: Secondary | ICD-10-CM | POA: Insufficient documentation

## 2023-06-01 LAB — CBC WITH DIFFERENTIAL (CANCER CENTER ONLY)
Abs Immature Granulocytes: 0.01 10*3/uL (ref 0.00–0.07)
Basophils Absolute: 0 10*3/uL (ref 0.0–0.1)
Basophils Relative: 1 %
Eosinophils Absolute: 0.4 10*3/uL (ref 0.0–0.5)
Eosinophils Relative: 7 %
HCT: 35.6 % — ABNORMAL LOW (ref 36.0–46.0)
Hemoglobin: 12 g/dL (ref 12.0–15.0)
Immature Granulocytes: 0 %
Lymphocytes Relative: 23 %
Lymphs Abs: 1.1 10*3/uL (ref 0.7–4.0)
MCH: 31.1 pg (ref 26.0–34.0)
MCHC: 33.7 g/dL (ref 30.0–36.0)
MCV: 92.2 fL (ref 80.0–100.0)
Monocytes Absolute: 0.6 10*3/uL (ref 0.1–1.0)
Monocytes Relative: 13 %
Neutro Abs: 2.7 10*3/uL (ref 1.7–7.7)
Neutrophils Relative %: 56 %
Platelet Count: 243 10*3/uL (ref 150–400)
RBC: 3.86 MIL/uL — ABNORMAL LOW (ref 3.87–5.11)
RDW: 13.1 % (ref 11.5–15.5)
WBC Count: 4.8 10*3/uL (ref 4.0–10.5)
nRBC: 0 % (ref 0.0–0.2)

## 2023-06-01 LAB — CMP (CANCER CENTER ONLY)
ALT: 26 U/L (ref 0–44)
AST: 22 U/L (ref 15–41)
Albumin: 4.2 g/dL (ref 3.5–5.0)
Alkaline Phosphatase: 76 U/L (ref 38–126)
Anion gap: 5 (ref 5–15)
BUN: 14 mg/dL (ref 8–23)
CO2: 28 mmol/L (ref 22–32)
Calcium: 9.3 mg/dL (ref 8.9–10.3)
Chloride: 105 mmol/L (ref 98–111)
Creatinine: 0.7 mg/dL (ref 0.44–1.00)
GFR, Estimated: >60 mL/min (ref >60)
Glucose, Bld: 87 mg/dL (ref 70–99)
Potassium: 4 mmol/L (ref 3.5–5.1)
Sodium: 138 mmol/L (ref 135–145)
Total Bilirubin: 0.3 mg/dL (ref 0.3–1.2)
Total Protein: 6.6 g/dL (ref 6.5–8.1)

## 2023-06-01 MED ORDER — HEPARIN SOD (PORK) LOCK FLUSH 100 UNIT/ML IV SOLN
500.0000 [IU] | Freq: Once | INTRAVENOUS | Status: AC
  Administered 2023-06-01: 500 [IU]

## 2023-06-01 MED ORDER — SODIUM CHLORIDE 0.9% FLUSH
10.0000 mL | Freq: Once | INTRAVENOUS | Status: AC
  Administered 2023-06-01: 10 mL

## 2023-06-01 NOTE — Progress Notes (Signed)
Patient Care Team: [name], MD as PCP - General (Unknown Physician Specialty) Pershing Proud, RN as Oncology Nurse Navigator Donnelly Angelica, RN as Oncology Nurse Navigator [name], MD as Consulting Physician (Hematology and Oncology) Antony Blackbird, MD as Consulting Physician (Radiation Oncology) [name], MD as Consulting Physician (General Surgery)  DIAGNOSIS:  Encounter Diagnosis  Name Primary?   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female, estrogen receptor positive (HCC) Yes    SUMMARY OF ONCOLOGIC HISTORY: Oncology History  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female, estrogen receptor positive (HCC)  09/15/2022 Initial Diagnosis   Screening mammogram detected right breast mass which measured 1.4 cm by ultrasound at 10 o'clock position, biopsy revealed grade 3 IDC with lymphovascular invasion ER 10%, PR 0%, Ki-67 30%, HER2 0 negative   09/25/2022 Cancer Staging   Staging form: Breast, AJCC 8th Edition - Clinical: Stage IB (cT1c, cN0, cM0, G3, ER+, PR-, HER2-) - Signed by [name], MD on 09/25/2022 Histologic grading system: 3 grade system    Genetic Testing   [facility] CancerNext-Expanded Panel+RNA was Negative. Report date is 10/06/2021.  The CancerNext-Expanded gene panel offered by [facility] and includes sequencing, rearrangement, and RNA analysis for the following 77 genes: AIP, ALK, APC, ATM, AXIN2, BAP1, BARD1, BLM, BMPR1A, BRCA1, BRCA2, BRIP1, CDC73, CDH1, CDK4, CDKN1B, CDKN2A, CHEK2, CTNNA1, DICER1, FANCC, FH, FLCN, GALNT12, KIF1B, LZTR1, MAX, MEN1, MET, MLH1, MSH2, MSH3, MSH6, MUTYH, NBN, NF1, NF2, NTHL1, PALB2, PHOX2B, PMS2, POT1, PRKAR1A, PTCH1, PTEN, RAD51C, RAD51D, RB1, RECQL, RET, SDHA, SDHAF2, SDHB, SDHC, SDHD, SMAD4, SMARCA4, SMARCB1, SMARCE1, STK11, SUFU, TMEM127, TP53, TSC1, TSC2, VHL and XRCC2 (sequencing and deletion/duplication); EGFR, EGLN1, HOXB13, KIT, MITF, PDGFRA, POLD1, and  POLE (sequencing only); EPCAM and GREM1 (deletion/duplication only).    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 with high-grade DCIS, inferior margin positive, angiolymphatic invasion present, 5/5 lymph nodes positive, additional medial margin: Poorly differentiated carcinoma with lymphoid stroma, additional inferior margin: Positive, ER 10% weak, PR 0%, HER2 0, Ki-67 30%   10/21/2022 Cancer Staging   Staging form: Breast, AJCC 8th Edition - Pathologic: Stage IIIC (pT1c, pN2a, cM0, G3, ER-, PR-, HER2-) - Signed by [name], MD on 10/21/2022 Stage prefix: Initial diagnosis Histologic grading system: 3 grade system   11/08/2022 Surgery   Margin reexcision: Inferior margin: Grade 3 IDC, new margin positive Medial margin: Grade 3 IDC new margin widely involved   11/18/2022 Surgery   Right inferior margin reexcision: Invasive poorly differentiated adenocarcinoma grade 3 present in new inferior margin.  Medial margin reexcision: Invasive poorly differentiated adenocarcinoma grade 3 present at new medial margin, posterior margin: Poorly differentiated adenocarcinoma grade 3, margin negative  11/08/2022 and 11/18/2022: Final margin focal positivity. After much discussion the plan is to finish her systemic treatment and come back at a later point to do mastectomy.    12/02/2022 - 04/18/2023 Chemotherapy   Patient is on Treatment Plan : BREAST ADJUVANT DOSE DENSE AC q14d / PACLitaxel q7d     05/03/2023 Surgery   Right mastectomy: 8 cm grade 3 IDC with extensive lymphatic and vascular invasion RCB class III, deep margin multifocally positive, 5/5 lymph nodes positive, ER weak 10%, PR 0%, HER2 0, Ki-67 30%   05/25/2023 -  Chemotherapy   Patient is on Treatment Plan : BREAST Pembrolizumab (200) q21d x 24 months       CHIEF COMPLIANT: [employer]  check/ Pembrolizumab maintenance every 3 weeks with capecitabine    INTERVAL HISTORY: Caitlin [name] is a  67 y.o. female is here because of above-mentioned history of breast cancer who underwent initially lumpectomy that revealed extensive positive margins and residual disease for triple negative breast cancer, she underwent neoadjuvant chemotherapy with dose dense Adriamycin and Cytoxan followed by Taxol. She presents to the clinic for a follow-up. Patient reports treatment has been going ok. She complains of a headache that stated Monday and lasts to Wednesday. She has been taking tylenol and aleve that has helped but not subsided. Denies any rashes or diarrhea.  ALLERGIES:  is allergic to penicillins.  MEDICATIONS:  Current Outpatient Medications  Medication Sig Dispense Refill   acetaminophen (TYLENOL) 500 MG tablet Take 500 mg by mouth every 6 (six) hours as needed for moderate pain.     albuterol (VENTOLIN HFA) 108 (90 Base) MCG/ACT inhaler Inhale 2 puffs into the lungs every 4 (four) hours as needed for shortness of breath (Asthma).     ALPRAZolam (XANAX) 0.5 MG tablet Take 1 tablet (0.5 mg total) by mouth at bedtime as needed for anxiety. Take 1 tab before the MRI for anxiety. 10 tablet 0   aspirin 81 MG tablet Take 81 mg by mouth daily.       benzonatate (TESSALON) 200 MG capsule Take 200 mg by mouth daily as needed for cough.     capecitabine (XELODA) 500 MG tablet Take 2 tablets (1,000 mg total) by mouth 2 (two) times daily after a meal. Monday-Friday on days of radiation 120 tablet 0   diltiazem (CARDIZEM CD) 120 MG 24 hr capsule Take 120 mg by mouth daily.     levothyroxine (SYNTHROID) 75 MCG tablet Take 75 mcg by mouth daily before breakfast.     Multiple Vitamins-Minerals (MULTIVITAMIN WITH MINERALS) tablet Take 1 tablet by mouth daily.     OVER THE COUNTER MEDICATION Take 1 capsule by mouth 2 (two) times daily. copaiba softgels     oxyCODONE (OXY IR/ROXICODONE) 5 MG immediate release tablet Take 1 tablet (5 mg total) by mouth every 4 (four)  hours as needed for moderate pain. 10 tablet 0   PULMICORT FLEXHALER 180 MCG/ACT inhaler Inhale 1 puff into the lungs daily.     triamcinolone cream (KENALOG) 0.1 % Apply 1 Application topically daily as needed (Dry skin).     No current facility-administered medications for this visit.    PHYSICAL EXAMINATION: ECOG PERFORMANCE STATUS: 1 - Symptomatic but completely ambulatory  Vitals:   06/01/23 0925  BP: (!) 153/83  Pulse: 79  Resp: 18  Temp: (!) 97.2 F (36.2 C)  SpO2: 97%   Filed Weights   06/01/23 0925  Weight: 148 lb 6.4 oz (67.3 kg)      LABORATORY DATA:  I have reviewed the data as listed    Latest Ref Rng & Units 06/01/2023    9:09 AM 05/25/2023    8:56 AM 04/18/2023    7:32 AM  CMP  Glucose 70 - 99 mg/dL 87  91  89   BUN 8 - 23 mg/dL 14  22  14    Creatinine 0.44 - 1.00 mg/dL 1.61  0.96  0.45   Sodium 135 - 145 mmol/L 138  140  140   Potassium 3.5 - 5.1 mmol/L 4.0  4.0  3.7   Chloride 98 - 111 mmol/L 105  107  107   CO2 22 - 32 mmol/L 28  28  25  Calcium 8.9 - 10.3 mg/dL 9.3  9.3  9.1   Total Protein 6.5 - 8.1 g/dL 6.6  6.8  6.5   Total Bilirubin 0.3 - 1.2 mg/dL 0.3  0.3  0.4   Alkaline Phos 38 - 126 U/L 76  67  63   AST 15 - 41 U/L 22  16  15    ALT 0 - 44 U/L 26  22  11      Lab Results  Component Value Date   WBC 4.8 06/01/2023   HGB 12.0 06/01/2023   HCT 35.6 (L) 06/01/2023   MCV 92.2 06/01/2023   PLT 243 06/01/2023   NEUTROABS 2.7 06/01/2023    ASSESSMENT & PLAN:  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female, estrogen receptor positive (HCC) 10/14/2022:Right lumpectomy: Grade 3 invasive poorly differentiated ductal adenocarcinoma 1.4 cm with focal lobular features with high-grade DCIS, inferior margin positive, angiolymphatic invasion present, 5/5 lymph nodes positive, additional medial margin: Poorly differentiated carcinoma with lymphoid stroma, additional inferior margin: Positive, ER 10% weak, PR 0%, HER2 0, Ki-67  30% T1c N2a: Stage IIIc pathological staging   CT CAP 10/29/2022: Several prominent lymph nodes and enlarged nodes right axilla and right supraclavicular region many of these are nonpathologic by size criteria.  No distant metastatic disease. Bone scan 10/29/2022: No evidence of bone metastasis   11/08/2022 and 11/18/2022: Margin reexcision surgeries: Final margin focal positivity.  After much discussion the plan is to finish her systemic treatment and come back at a later point to do mastectomy.   Treatment summary: Adjuvant chemotherapy with dose dense Adriamycin and Cytoxan followed by Taxol to be completed 05/02/2023 Mastectomy by Dr. Dwain Sarna 05/03/2023:Right mastectomy: 8 cm grade 3 IDC with extensive lymphatic and vascular invasion RCB class III, deep margin multifocally positive, 5/5 lymph nodes positive, ER weak 10%, PR 0%, HER2 0, Ki-67 30%, total lymph nodes: 10/10 (prior lumpectomy she had 5 positive nodes) Adjuvant radiation to start 06/09/2023 ------------------------------------------------------------------------------------------------------------------------------------------------ CT CAP 05/22/2023: Similar to slightly decreased size of right axillary and subpectoral lymph nodes few prominent supraclavicular nodes increased or new (nonspecific versus metastatic), liver hypodensity indeterminate sclerosis left ninth rib fracture Bone scan 05/22/2023: Left ninth rib fracture otherwise negative Ordered MRI of the liver for assessment of the liver findings.   Current treatment: Pembrolizumab maintenance every 3 weeks started 05/25/2023 with capecitabine Toxicities: Headaches: Improved with Tylenol and Advil.  They are still ongoing at a mild level.  She is going to start with radiation next week.  She will start capecitabine simultaneously. Return to clinic in 2 weeks for cycle 2 Keytruda    No orders of the defined types were placed in this encounter.  The patient has a good  understanding of the overall plan. she agrees with it. she will call with any problems that may develop before the next visit here. Total time spent: 30 mins including face to face time and time spent for planning, charting and co-ordination of care   Tamsen Meek, MD 06/01/23    I Janan Ridge am acting as a Neurosurgeon for [employer]  I have reviewed the above documentation for accuracy and completeness, and I agree with the above.

## 2023-06-01 NOTE — Assessment & Plan Note (Signed)
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  No distant metastatic disease. Bone scan 10/29/2022: No evidence of bone metastasis   11/08/2022 and 11/18/2022: Margin reexcision surgeries: Final margin focal positivity.  After much discussion the plan is to finish her systemic treatment and come back at a later point to do mastectomy.   Treatment summary: Adjuvant chemotherapy with dose dense Adriamycin and Cytoxan followed by Taxol to be completed 05/02/2023 Mastectomy by Dr. Dwain Sarna 05/03/2023:Right mastectomy: 8 cm grade 3 IDC with extensive lymphatic and vascular invasion RCB class III, deep margin multifocally positive, 5/5 lymph nodes positive, ER weak 10%, PR 0%, HER2 0, Ki-67 30%, total lymph nodes: 10/10 (prior lumpectomy she had 5 positive nodes) Adjuvant radiation to start 06/09/2023 ------------------------------------------------------------------------------------------------------------------------------------------------ CT CAP 05/22/2023: Similar to slightly decreased size of right axillary and subpectoral lymph nodes few prominent supraclavicular nodes increased or new (nonspecific versus metastatic), liver hypodensity indeterminate sclerosis left ninth rib fracture Bone scan 05/22/2023: Left ninth rib fracture otherwise negative Ordered MRI of the liver for assessment of the liver findings.   Current treatment: Pembrolizumab maintenance every 3 weeks  started 05/25/2023 with capecitabine Toxicities:  Return to clinic in 2 weeks for cycle 2 Keytruda

## 2023-06-01 NOTE — Therapy (Signed)
OUTPATIENT PHYSICAL THERAPY  UPPER EXTREMITY ONCOLOGY EVALUATION  Patient Name: Caitlin [name] MRN: [id number] DOB:02/20/1956, 67 y.o., female Today's Date: 06/01/2023  END OF SESSION:  PT End of Session - 06/01/23 1054     Visit Number 1    Number of Visits 9    Date for PT Re-Evaluation 06/29/23    PT Start Time 1014   pt arrived late   PT Stop Time 1050    PT Time Calculation (min) 36 min    Activity Tolerance Patient tolerated treatment well    Behavior During Therapy [facility] for tasks assessed/performed             Past Medical History:  Diagnosis Date   Asthma    Complication of anesthesia    Dyspnea    Dysrhythmia    2012   Headache    History of kidney stones    passed   Hypothyroidism    Pneumonia 01/26/2023   PONV (postoperative nausea and vomiting)    rt breast ca 08/2022   Right Breast   Scoliosis    Thyroid disease    Past Surgical History:  Procedure Laterality Date   APPENDECTOMY     AXILLARY SENTINEL NODE BIOPSY Right 10/14/2022   Procedure: RIGHT AXILLARY SENTINEL NODE BIOPSY;  Surgeon: [name], MD;  Location: [facility] OR;  Service: General;  Laterality: Right;   BREAST BIOPSY Right 09/15/2022   [place] RT BREAST BX W LOC DEV 1ST LESION IMG BX SPEC US GUIDE 09/15/2022 [facility]   BREAST BIOPSY  10/12/2022   [place] RT RADIOACTIVE SEED LOC 10/12/2022 [facility]   BREAST LUMPECTOMY WITH RADIOACTIVE SEED AND SENTINEL LYMPH NODE BIOPSY Right 10/14/2022   Procedure: RIGHT BREAST LUMPECTOMY WITH RADIOACTIVE SEED;  Surgeon: [name], MD;  Location: [facility] OR;  Service: General;  Laterality: Right;   COLONOSCOPY     LAPAROSCOPIC APPENDECTOMY     PORT-A-CATH REMOVAL Left 05/03/2023   Procedure: REMOVAL PORT-A-CATH;  Surgeon: [name], MD;  Location: [facility] OR;  Service: General;  Laterality: Left;   PORTACATH PLACEMENT Left 10/14/2022   Procedure: INSERTION PORT-A-CATH;  Surgeon: [name], MD;  Location: [facility] OR;  Service:  General;  Laterality: Left;   PORTACATH PLACEMENT Left 05/19/2023   Procedure: PORT PLACEMENT WITH ULTRASOUND GUIDANCE;  Surgeon: [name], MD;  Location: [facility];  Service: General;  Laterality: Left;   RE-EXCISION OF BREAST LUMPECTOMY Right 11/08/2022   Procedure: RE-EXCISION OF RIGHT BREAST LUMPECTOMY;  Surgeon: [name], MD;  Location: [facility];  Service: General;  Laterality: Right;   RE-EXCISION OF BREAST LUMPECTOMY Right 11/18/2022   Procedure: RE-EXCISION OF RIGHT BREAST LUMPECTOMY;  Surgeon: [name], MD;  Location: [facility] OR;  Service: General;  Laterality: Right;   SIMPLE MASTECTOMY WITH AXILLARY SENTINEL NODE BIOPSY Right 05/03/2023   Procedure: RIGHT MASTECTOMY;  Surgeon: [name], MD;  Location: [facility] OR;  Service: General;  Laterality: Right;  GEN & PEC BLOCK   WISDOM TOOTH EXTRACTION     Patient Active Problem List   Diagnosis Date Noted   Breast CA (HCC) 05/03/2023   S/P mastectomy, right 05/03/2023   Port-A-Cath in place 12/16/2022   Genetic testing 10/11/2022   Appendicitis with peritonitis 09/29/2022   Family history of prostate cancer 09/29/2022   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female, estrogen receptor positive (HCC) 09/22/2022   Atrial fibrillation with RVR (HCC) 11/30/2010   Chronic anticoagulation 11/30/2010   Hypothyroidism 11/30/2010  PALPITATIONS 11/27/2010    PCP: Donzetta Sprung MD  REFERRING PROVIDER: Antony Blackbird, MD  REFERRING DIAG: [phone] (ICD-10-CM) - 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female, estrogen receptor positive (HCC)  THERAPY DIAG:  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female, estrogen receptor positive (HCC) - Plan: PT plan of care cert/re-cert  Stiffness of right shoulder, not elsewhere classified - Plan: PT plan of care cert/re-cert  Aftercare following surgery for neoplasm - Plan:  PT plan of care cert/re-cert  Abnormal posture - Plan: PT plan of care cert/re-cert  ONSET DATE: 05/03/23  Rationale for Evaluation and Treatment: Rehabilitation  SUBJECTIVE:                                                                                                                                                                                           SUBJECTIVE STATEMENT: I was doing some exercises but then I stopped. I am tight and I have some cording.   PERTINENT HISTORY:  Patient was diagnosed on 09/15/22 with right grade 3. It measures 1.4 x 0.9 x 1.2 cm and is located in the upper-outer quadrant. It is ER+, PR-, HER2- with a Ki67 of 30%. Plan is to undergo a R lumpectomy and SLNB on 10/14/22 followed by chemo and then radiation.  10/14/22: R breast lumpectomy 5/5, 11/08/22: Re excision of R lumpectomy, 11/18/22 - Re excision of R lumpectomy,completed neoadjuvant, 05/03/23 R mastectomy with positive margins 5/5 nodes, pt is currently taking [place] and xeloda, will begin radiation on 06/08/23  PAIN:  Are you having pain? No  PRECAUTIONS: Other: at risk of lymphedema  RED FLAGS: None   WEIGHT BEARING RESTRICTIONS: No  FALLS:  Has patient fallen in last 6 months? 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  LEISURE: walks from house to barn, is active throughout the day  HAND DOMINANCE: right   PRIOR LEVEL OF FUNCTION: Independent  PATIENT GOALS: to get more range of motion of R shoulder   OBJECTIVE:  COGNITION: Overall cognitive status: Within functional limits for tasks assessed   PALPATION: Several cords palpable in R axilla extending to scar and down upper arm  OBSERVATIONS / OTHER ASSESSMENTS: steri strips still in place over healing mastectomy scar, cording visible in R axilla with abduction  POSTURE: rounded shoulders, forward head  UPPER  EXTREMITY AROM/PROM:  A/PROM RIGHT   eval   Shoulder extension 77  Shoulder flexion 110  Shoulder abduction 116  Shoulder internal rotation 75  Shoulder external rotation 57    (  Blank rows = not tested)  A/PROM LEFT   eval  Shoulder extension 67  Shoulder flexion 157  Shoulder abduction 158  Shoulder internal rotation 62  Shoulder external rotation 83    (Blank rows = not tested)   UPPER EXTREMITY STRENGTH:   LYMPHEDEMA ASSESSMENTS:   SURGERY TYPE/DATE: R mastectomy and SLNB on 10/14/22 followed by chemo and then radiation.  10/14/22: R breast lumpectomy 5/5, 11/08/22: Re excision of R lumpectomy, 11/18/22 - Re excision of R lumpectomy,completed neoadjuvant, 05/03/23 R mastectomy with positive margins 5/5 nodes,  NUMBER OF LYMPH NODES REMOVED: see above  CHEMOTHERAPY: completed neoadjuvant  RADIATION:beginning 06/08/23     LYMPHEDEMA ASSESSMENTS:   LANDMARK RIGHT  eval  At axilla  33  15 cm proximal to olecranon process 29.5  10 cm proximal to olecranon process 27  Olecranon process 23.5  15 cm proximal to ulnar styloid process 23.1  10 cm proximal to ulnar styloid process 20  Just proximal to ulnar styloid process 15.6  Across hand at thumb web space 20.9  At base of 2nd digit 6.5  (Blank rows = not tested)  LANDMARK LEFT  eval  At axilla  32.5  15 cm proximal to olecranon process 29.1  10 cm proximal to olecranon process 27.5  Olecranon process 23.9  15 cm proximal to ulnar styloid process 22.7  10 cm proximal to ulnar styloid process 19.7  Just proximal to ulnar styloid process 15.2  Across hand at thumb web space 19  At base of 2nd digit 6.2  (Blank rows = not tested)    QUICK DASH SURVEY:   Neldon Mc - 06/01/23 0001     Open a tight or new jar No difficulty    Do heavy household chores (wash walls, wash floors) Mild difficulty    Carry a shopping bag or briefcase No difficulty    Wash your back Mild difficulty    Use a knife to cut food No  difficulty    Recreational activities in which you take some force or impact through your arm, shoulder, or hand (golf, hammering, tennis) Moderate difficulty    During the past week, to what extent has your arm, shoulder or hand problem interfered with your normal social activities with family, friends, neighbors, or groups? Not at all    During the past week, to what extent has your arm, shoulder or hand problem limited your work or other regular daily activities Not at all    Arm, shoulder, or hand pain. Mild    Tingling (pins and needles) in your arm, shoulder, or hand None    Difficulty Sleeping No difficulty    DASH Score 11.36 %               TODAY'S TREATMENT:  DATE:  06/01/23- none today due to time constraints    PATIENT EDUCATION:  Education details: post op exercises, cording Person educated: Patient Education method: Explanation, Demonstration, and Handouts Education comprehension: verbalized understanding and returned demonstration  HOME EXERCISE PROGRAM: Post op exercises  ASSESSMENT:  CLINICAL IMPRESSION: Patient is a 67 y.o. female who was seen today for physical therapy evaluation and treatment for cording in R axilla, decreased R shoulder ROM, and decreased scar mobility. Pt has a history of R lumpectomy and SLNB on 10/14/22 followed by chemo and then radiation.  10/14/22: R breast lumpectomy 5/5 nodes, 11/08/22: Re excision of R lumpectomy, 11/18/22 - Re excision of R lumpectomy,completed neoadjuvant, 05/03/23 R mastectomy with positive margins 5/5 nodes. She is beginning radiation next week. Pt has cording palpable in R axilla and decreased R shoulder ROM. Pt would benefit from skilled PT services to improve R shoulder ROM and decrease cording to allow improved function.    OBJECTIVE IMPAIRMENTS: decreased ROM, decreased strength,  increased fascial restrictions, impaired UE functional use, and postural dysfunction.   ACTIVITY LIMITATIONS: carrying, lifting, and reach over head  PARTICIPATION LIMITATIONS: meal prep, laundry, and yard work  PERSONAL FACTORS: 1 comorbidity: previous history of R lumpectomy  are also affecting patient's functional outcome.   REHAB POTENTIAL: Good  CLINICAL DECISION MAKING: Stable/uncomplicated  EVALUATION COMPLEXITY: Low  GOALS: Goals reviewed with patient? Yes  SHORT TERM GOALS=LONG TERM GOALS Target date: 06/29/23  Pt will demonstrate 150 degrees of R shoulder flexion to allow pt to reach overhead.  Baseline: 110 Goal status: INITIAL  2.  Pt will demonstrate 150 degrees of R shoulder abduction to allow her to reach out to the side. Baseline: 116 Goal status: INITIAL  3.  Pt will report a 75% improvement in cording in R axilla to allow improved comfort.  Baseline:  Goal status: INITIAL  4.  Pt will be independent in a home exercise program for continued strengthening and stretching.  Baseline:  Goal status: INITIAL  PLAN:  PT FREQUENCY: 2x/week  PT DURATION: 4 weeks  PLANNED INTERVENTIONS: Therapeutic exercises, Therapeutic activity, Patient/Family education, Self Care, Orthotic/Fit training, Manual lymph drainage, scar mobilization, Taping, Vasopneumatic device, and Manual therapy  PLAN FOR NEXT SESSION: MFR to cording in R axilla, PROM to R shoulder, pulleys, ball  [employer], PT 06/01/2023, 11:03 AM

## 2023-06-02 ENCOUNTER — Other Ambulatory Visit (HOSPITAL_COMMUNITY): Payer: Self-pay

## 2023-06-02 ENCOUNTER — Other Ambulatory Visit: Payer: Self-pay

## 2023-06-03 ENCOUNTER — Other Ambulatory Visit: Payer: Self-pay

## 2023-06-03 DIAGNOSIS — C50411 Malignant neoplasm of upper-outer quadrant of right female breast: Secondary | ICD-10-CM

## 2023-06-03 NOTE — Telephone Encounter (Addendum)
Oral Chemotherapy Pharmacist Encounter  I spoke with patient for overview of: Xeloda for the treatment of triple negative breast cancer in conjunction with radiation and pembrolizumab, planned duration 5 1/2 - 6 weeks.   Counseled patient on administration, dosing, side effects, monitoring, drug-food interactions, safe handling, storage, and disposal.  Patient will take Xeloda 500mg  tablets, 2 tablets (1000mg ) by mouth in AM and 2 tabs (1000mg ) by mouth in PM, within 30 minutes of finishing meals, on days of radiation only.  Xeloda and radiation start date: 06/08/2023  Adverse effects of Xeloda include but are not limited to: fatigue, decreased blood counts, GI upset, diarrhea, mouth sores, and hand-foot syndrome.  Patient offered referral to diclofenac prophylaxis hand-foot syndrome research project. Patient agreed to referral.   Patient is not currently taking the triamcinolone cream for dry skin and last time filled was in April 2024.   Patient has anti-emetic (compazine) on hand and knows to take it if nausea develops.   Patient will obtain anti diarrheal and alert the office of 4 or more loose stools above baseline.   Reviewed with patient importance of keeping a medication schedule and plan for any missed doses. No barriers to medication adherence identified.  Medication reconciliation performed and medication/allergy list updated.  Insurance authorization for Xeloda has been obtained. Test claim at the pharmacy revealed copayment $0 for 1st fill of 28 day supply. This will ship from the [facility] outpatient pharmacy on 06/02/23 to deliver to patient's home on 06/03/23.  Patient informed the pharmacy will reach out 5-7 days prior to needing next fill of Xeloda to coordinate continued medication acquisition to prevent break in therapy.  All questions answered.  Patient voiced understanding and appreciation.   Medication education handout placed in mail for patient. Patient knows  to call the office with questions or concerns. Oral Chemotherapy Clinic phone number provided to patient.   Bethel Born, PharmD Hematology/Oncology Clinical Pharmacist [facility] [phone] 06/03/2023  12:14 PM

## 2023-06-03 NOTE — Progress Notes (Signed)
Research Note - Consent Authorization   Study Name: Evaluation of Topical Diclofenac Use on the Incidence and Severity of Hand Foot Syndrome in Patients with Breast and Gastrointestinal Malignancies Taking Capecitabine   IRB# [id number]   A summary of the study was presented to the patient, including potential risks and benefits from study participation, alternatives treatment options available other than study participation, how their confidentiality will be maintained, and who to contact if they had questions regarding their rights as a study participant or if they experience an injury associated with their participation. They were told that participation is completely voluntary and choosing not to participate would not impact care they would otherwise have access to. They were also told that even if they choose to participate, they can withdraw their consent to participate at any time in the future. Any questions the patient asked were addressed by a member of the research team.   After having all of their questions answered, the patient agreed to participate in the study by confirming their willingness to consent to participation verbally over the phone on 06/03/2023 at 2:12 PM.    Jerry Caras, PharmD PGY2 Oncology Pharmacy Resident  Principal Investigator

## 2023-06-04 ENCOUNTER — Encounter: Payer: Self-pay | Admitting: Physical Therapy

## 2023-06-07 ENCOUNTER — Encounter: Payer: Self-pay | Admitting: Hematology and Oncology

## 2023-06-07 ENCOUNTER — Ambulatory Visit
Admission: RE | Admit: 2023-06-07 | Discharge: 2023-06-07 | Disposition: A | Discharge status: Home / Self Care | Payer: BC Managed Care – PPO | Source: Ambulatory Visit | Attending: Radiation Oncology | Admitting: Radiation Oncology

## 2023-06-07 ENCOUNTER — Other Ambulatory Visit: Payer: Self-pay

## 2023-06-07 ENCOUNTER — Other Ambulatory Visit: Payer: Self-pay | Admitting: Hematology and Oncology

## 2023-06-07 ENCOUNTER — Other Ambulatory Visit (HOSPITAL_COMMUNITY): Payer: Self-pay

## 2023-06-07 ENCOUNTER — Encounter: Payer: Self-pay | Admitting: Radiation Oncology

## 2023-06-07 DIAGNOSIS — Z8042 Family history of malignant neoplasm of prostate: Secondary | ICD-10-CM | POA: Diagnosis not present

## 2023-06-07 DIAGNOSIS — Z510 Encounter for antineoplastic radiation therapy: Secondary | ICD-10-CM | POA: Diagnosis present

## 2023-06-07 DIAGNOSIS — C773 Secondary and unspecified malignant neoplasm of axilla and upper limb lymph nodes: Secondary | ICD-10-CM | POA: Diagnosis not present

## 2023-06-07 DIAGNOSIS — Z170 Estrogen receptor positive status [ER+]: Secondary | ICD-10-CM | POA: Diagnosis present

## 2023-06-07 DIAGNOSIS — C50411 Malignant neoplasm of upper-outer quadrant of right female breast: Secondary | ICD-10-CM | POA: Insufficient documentation

## 2023-06-07 DIAGNOSIS — R21 Rash and other nonspecific skin eruption: Secondary | ICD-10-CM | POA: Insufficient documentation

## 2023-06-07 MED ORDER — DICLOFENAC SODIUM 1 % EX GEL
CUTANEOUS | 0 refills | Status: DC
Start: 2023-06-07 — End: 2023-12-01
  Filled 2023-06-07: qty 200, 50d supply, fill #0

## 2023-06-08 ENCOUNTER — Other Ambulatory Visit: Payer: Self-pay

## 2023-06-08 ENCOUNTER — Telehealth: Payer: Self-pay | Admitting: *Deleted

## 2023-06-08 ENCOUNTER — Other Ambulatory Visit (HOSPITAL_COMMUNITY): Payer: Self-pay

## 2023-06-08 DIAGNOSIS — Z170 Estrogen receptor positive status [ER+]: Secondary | ICD-10-CM

## 2023-06-08 DIAGNOSIS — Z510 Encounter for antineoplastic radiation therapy: Secondary | ICD-10-CM | POA: Diagnosis not present

## 2023-06-08 LAB — RAD ONC ARIA SESSION SUMMARY

## 2023-06-08 NOTE — Telephone Encounter (Signed)
This RN spoke with pt in the lobby per her concern with "rash" on left hand.  Pt showed RN site on the outside of her pinky finger of very mildly rash bumps - not red or even really noticeable unless you angle the hand with the light.  She states second area of sensitivity on the palm by the thumb.  She states areas itch.  Skin is normal in color and without any excoriation,redness or increased warmth.  Rash appeared yesterday and she took her first xeloda this am and is due to start the voltaren gel study under research.  This RN asked her at this time to use benadryl and hydrcortisone cream to site.  This note will be forwarded to MD and to Research for review of above recommendations.

## 2023-06-09 ENCOUNTER — Other Ambulatory Visit: Payer: Self-pay

## 2023-06-09 ENCOUNTER — Other Ambulatory Visit (HOSPITAL_COMMUNITY): Payer: Self-pay

## 2023-06-09 ENCOUNTER — Ambulatory Visit
Admission: RE | Admit: 2023-06-09 | Discharge: 2023-06-09 | Disposition: A | Discharge status: Home / Self Care | Payer: BC Managed Care – PPO | Source: Ambulatory Visit | Attending: Radiation Oncology | Admitting: Radiation Oncology

## 2023-06-09 ENCOUNTER — Encounter: Payer: Self-pay | Admitting: Hematology and Oncology

## 2023-06-09 ENCOUNTER — Telehealth: Payer: Self-pay

## 2023-06-09 ENCOUNTER — Inpatient Hospital Stay (HOSPITAL_BASED_OUTPATIENT_CLINIC_OR_DEPARTMENT_OTHER): Payer: BC Managed Care – PPO | Admitting: Physician Assistant

## 2023-06-09 VITALS — BP 123/81 | HR 75 | Temp 98.0°F | Resp 15 | Ht 63.0 in | Wt 147.5 lb

## 2023-06-09 DIAGNOSIS — Z8042 Family history of malignant neoplasm of prostate: Secondary | ICD-10-CM | POA: Insufficient documentation

## 2023-06-09 DIAGNOSIS — C50411 Malignant neoplasm of upper-outer quadrant of right female breast: Secondary | ICD-10-CM | POA: Insufficient documentation

## 2023-06-09 DIAGNOSIS — Z170 Estrogen receptor positive status [ER+]: Secondary | ICD-10-CM

## 2023-06-09 DIAGNOSIS — R21 Rash and other nonspecific skin eruption: Secondary | ICD-10-CM

## 2023-06-09 DIAGNOSIS — Z510 Encounter for antineoplastic radiation therapy: Secondary | ICD-10-CM | POA: Diagnosis not present

## 2023-06-09 DIAGNOSIS — C773 Secondary and unspecified malignant neoplasm of axilla and upper limb lymph nodes: Secondary | ICD-10-CM | POA: Insufficient documentation

## 2023-06-09 LAB — RAD ONC ARIA SESSION SUMMARY
Course Elapsed Days: 1
Plan Fractions Treated to Date: 1
Plan Fractions Treated to Date: 2
Plan Prescribed Dose Per Fraction: 2 Gy
Plan Prescribed Dose Per Fraction: 2 Gy
Plan Total Fractions Prescribed: 12
Plan Total Fractions Prescribed: 25
Plan Total Prescribed Dose: 24 Gy
Plan Total Prescribed Dose: 50 Gy
Reference Point Dosage Given to Date: 2 Gy
Reference Point Dosage Given to Date: 4 Gy
Reference Point Session Dosage Given: 2 Gy
Reference Point Session Dosage Given: 2 Gy
Session Number: 2

## 2023-06-09 NOTE — Telephone Encounter (Signed)
Pt was seen by Rona Ravens, RN yesterday regarding rash she reports to her left hand. She states the rash starts on her left 5th digit and extends down her hand. She reports the rash is also now on her right hands and "other places" She is concerned with the weekend coming that the rash will continue to spread and would like to have it evaluated further.   I offered pt appt with [facility] today at 2 PM after xrt and she accepted this appt.

## 2023-06-09 NOTE — Progress Notes (Signed)
Symptom Management Consult Note [facility]    Patient Care Team: [name], MD as PCP - General (Unknown Physician Specialty) Pershing Proud, RN as Oncology Nurse Navigator Donnelly Angelica, RN as Oncology Nurse Navigator [name], MD as Consulting Physician (Hematology and Oncology) Antony Blackbird, MD as Consulting Physician (Radiation Oncology) [name], MD as Consulting Physician (General Surgery)    Name / MRN / DOB: Caitlin [name]  [id number]  1955-12-06   Date of visit: 06/09/2023   Chief Complaint/Reason for visit: rash   Current Therapy: Pembrolizumab maintenance every 3 weeks with capecitabine and concurrent radiation  Last treatment:  Day 1   Cycle 1 on 05/25/23   ASSESSMENT & PLAN: Patient is a 67 y.o. female with oncologic history of malignant neoplasm of upper-outer quadrant of right breast in female, estrogen receptor positive followed by Dr. Pamelia Hoit.  I have viewed most recent oncology note and lab work.    #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female, estrogen receptor positive - Currently undergoing radiation - Next appointment with oncologist is 06/15/23   #Rash -Acute. Started x 24 hours after Xeloda. Also had first Keytruda x 2 weeks ago. -Concern for adverse reaction to Xeloda although considering Keytruda as well. -Plan was to prescribe triamcinolone for patient however she has this ointment at home already and she has not yet used it for this rash. She will apply ointment starting tonight. Patient will RTC Monday for recheck. Encouraged zyrtec to help with pruritus as well.  -If rash resolves plan to restart Xeloda at lower dose. Dr. Pamelia Hoit agrees with plan   Strict ED precautions discussed should symptoms worsen.    Heme/Onc History: Oncology History  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female, estrogen receptor positive (HCC)  09/15/2022 Initial  Diagnosis   Screening mammogram detected right breast mass which measured 1.4 cm by ultrasound at 10 o'clock position, biopsy revealed grade 3 IDC with lymphovascular invasion ER 10%, PR 0%, Ki-67 30%, HER2 0 negative   09/25/2022 Cancer Staging   Staging form: Breast, AJCC 8th Edition - Clinical: Stage IB (cT1c, cN0, cM0, G3, ER+, PR-, HER2-) - Signed by [name], MD on 09/25/2022 Histologic grading system: 3 grade system    Genetic Testing   [facility] CancerNext-Expanded Panel+RNA was Negative. Report date is 10/06/2021.  The CancerNext-Expanded gene panel offered by [facility] and includes sequencing, rearrangement, and RNA analysis for the following 77 genes: AIP, ALK, APC, ATM, AXIN2, BAP1, BARD1, BLM, BMPR1A, BRCA1, BRCA2, BRIP1, CDC73, CDH1, CDK4, CDKN1B, CDKN2A, CHEK2, CTNNA1, DICER1, FANCC, FH, FLCN, GALNT12, KIF1B, LZTR1, MAX, MEN1, MET, MLH1, MSH2, MSH3, MSH6, MUTYH, NBN, NF1, NF2, NTHL1, PALB2, PHOX2B, PMS2, POT1, PRKAR1A, PTCH1, PTEN, RAD51C, RAD51D, RB1, RECQL, RET, SDHA, SDHAF2, SDHB, SDHC, SDHD, SMAD4, SMARCA4, SMARCB1, SMARCE1, STK11, SUFU, TMEM127, TP53, TSC1, TSC2, VHL and XRCC2 (sequencing and deletion/duplication); EGFR, EGLN1, HOXB13, KIT, MITF, PDGFRA, POLD1, and POLE (sequencing only); EPCAM and GREM1 (deletion/duplication only).    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 with high-grade DCIS, inferior margin positive, angiolymphatic invasion present, 5/5 lymph nodes positive, additional medial margin: Poorly differentiated carcinoma with lymphoid stroma, additional inferior margin: Positive, ER 10% weak, PR 0%, HER2 0, Ki-67 30%   10/21/2022 Cancer Staging   Staging form: Breast, AJCC 8th Edition - Pathologic: Stage IIIC (pT1c, pN2a, cM0, G3, ER-, PR-, HER2-) - Signed by Pamelia Hoit,  Mikey [employer], MD on 10/21/2022 Stage prefix: Initial diagnosis Histologic grading system: 3 grade system    11/08/2022 Surgery   Margin reexcision: Inferior margin: Grade 3 IDC, new margin positive Medial margin: Grade 3 IDC new margin widely involved   11/18/2022 Surgery   Right inferior margin reexcision: Invasive poorly differentiated adenocarcinoma grade 3 present in new inferior margin.  Medial margin reexcision: Invasive poorly differentiated adenocarcinoma grade 3 present at new medial margin, posterior margin: Poorly differentiated adenocarcinoma grade 3, margin negative  11/08/2022 and 11/18/2022: Final margin focal positivity. After much discussion the plan is to finish her systemic treatment and come back at a later point to do mastectomy.    12/02/2022 - 04/18/2023 Chemotherapy   Patient is on Treatment Plan : BREAST ADJUVANT DOSE DENSE AC q14d / PACLitaxel q7d     05/03/2023 Surgery   Right mastectomy: 8 cm grade 3 IDC with extensive lymphatic and vascular invasion RCB class III, deep margin multifocally positive, 5/5 lymph nodes positive, ER weak 10%, PR 0%, HER2 0, Ki-67 30%   05/25/2023 -  Chemotherapy   Patient is on Treatment Plan : BREAST Pembrolizumab (200) q21d x 24 months         Interval history-: Caitlin [name] is a 67 y.o. female with oncologic history as above presenting to [facility] today with chief complaint of rash x 1 day.  She is accompanied by her mother who provides additional history.  Patient states that she began Xeloda 24 hours ago.  Prior to starting Xeloda she had a rash that she describes as bumps on her fingers that were flesh-colored.  She noticed today that she has a red rash on both of her forearms.  She has associated pruritus.  She has not applied any creams or lotions.  She has been walking back and forth to the barn on [place] property, has not been in any brush or in the garden.  She denies any fevers or chills.  Denies any pain.  Patient also had her first Keytruda immunotherapy x 2 weeks ago that she reports tolerating well overall.      ROS  All other  systems are reviewed and are negative for acute change except as noted in the HPI.    Allergies  Allergen Reactions   Penicillins Rash    childhood reaction     Past Medical History:  Diagnosis Date   Asthma    Complication of anesthesia    Dyspnea    Dysrhythmia    2012   Headache    History of kidney stones    passed   Hypothyroidism    Pneumonia 01/26/2023   PONV (postoperative nausea and vomiting)    rt breast ca 08/2022   Right Breast   Scoliosis    Thyroid disease      Past Surgical History:  Procedure Laterality Date   APPENDECTOMY     AXILLARY SENTINEL NODE BIOPSY Right 10/14/2022   Procedure: RIGHT AXILLARY SENTINEL NODE BIOPSY;  Surgeon: [name], MD;  Location: [facility] OR;  Service: General;  Laterality: Right;   BREAST BIOPSY Right 09/15/2022   [place] RT BREAST BX W LOC DEV 1ST LESION IMG BX SPEC US GUIDE 09/15/2022 [facility]   BREAST BIOPSY  10/12/2022   [place] RT RADIOACTIVE SEED LOC 10/12/2022 [facility]MO[facility]   BREAST LUMPECTOMY WITH RADIOACTIVE SEED AND SENTINEL LYMPH NODE BIOPSY Right 10/14/2022   Procedure: RIGHT BREAST LUMPECTOMY WITH RADIOACTIVE SEED;  Surgeon: [name], MD;  Location: [facility] OR;  Service: General;  Laterality: Right;   COLONOSCOPY     LAPAROSCOPIC APPENDECTOMY     PORT-A-CATH REMOVAL Left 05/03/2023   Procedure: REMOVAL PORT-A-CATH;  Surgeon: [name], MD;  Location: [facility] OR;  Service: General;  Laterality: Left;   PORTACATH PLACEMENT Left 10/14/2022   Procedure: INSERTION PORT-A-CATH;  Surgeon: [name], MD;  Location[facility] OR;  Service: General;  Laterality: Left;   PORTACATH PLACEMENT Left 05/19/2023   Procedure: PORT PLACEMENT WITH ULTRASOUND GUIDANCE;  Surgeon: [name], MD;  Location: [facility];  Service: General;  Laterality: Left;   RE-EXCISION OF BREAST LUMPECTOMY Right 11/08/2022   Procedure: RE-EXCISION OF RIGHT BREAST LUMPECTOMY;  Surgeon: [name], MD;   Location: [facility];  Service: General;  Laterality: Right;   RE-EXCISION OF BREAST LUMPECTOMY Right 11/18/2022   Procedure: RE-EXCISION OF RIGHT BREAST LUMPECTOMY;  Surgeon: [name], MD;  Location: [facility] OR;  Service: General;  Laterality: Right;   SIMPLE MASTECTOMY WITH AXILLARY SENTINEL NODE BIOPSY Right 05/03/2023   Procedure: RIGHT MASTECTOMY;  Surgeon: [name], MD;  Location: [facility] OR;  Service: General;  Laterality: Right;  GEN & PEC BLOCK   WISDOM TOOTH EXTRACTION      Social History   Socioeconomic History   Marital status: Married    Spouse name: Not on file   Number of children: Not on file   Years of education: Not on file   Highest education level: Not on file  Occupational History   Not on file  Tobacco Use   Smoking status: Never   Smokeless tobacco: Not on file  Vaping Use   Vaping status: Never Used  Substance and Sexual Activity   Alcohol use: Not Currently   Drug use: Never   Sexual activity: Not Currently  Other Topics Concern   Not on file  Social History Narrative   Not on file   Social Determinants of Health   Financial Resource Strain: Not on file  Food Insecurity: No Food Insecurity (05/27/2023)   Hunger Vital Sign    Worried About Running Out of Food in the Last Year: Never true    Ran Out of Food in the Last Year: Never true  Transportation Needs: No Transportation Needs (05/27/2023)   PRAPARE - Transportation    Lack of Transportation (Medical): No    Lack of Transportation (Non-Medical): No  Physical Activity: Sufficiently Active (06/19/2020)   Received from [facility]   Exercise Vital Sign    Days of Exercise per Week: 7 days    Minutes of Exercise per Session: 30 min  Stress: Not on file  Social Connections: Not on file  Intimate Partner Violence: Not At Risk (05/27/2023)   Humiliation, Afraid, Rape, and Kick questionnaire    Fear of Current or Ex-Partner: No    Emotionally Abused: No     Physically Abused: No    Sexually Abused: No    Family History  Problem Relation Age of Onset   Thyroid disease Mother    Prostate cancer Father 70       metastatic   Hypertension Father    Thyroid disease Sister        twin sister age 39   Asthma Sister    Prostate cancer Brother 39   Heart attack Maternal Grandmother        died age 97's   Heart attack Maternal Grandfather        died age 35's   Clotting  disorder Paternal Grandmother        cerebral hemorrage     Current Outpatient Medications:    acetaminophen (TYLENOL) 500 MG tablet, Take 500 mg by mouth every 6 (six) hours as needed for moderate pain., Disp: , Rfl:    albuterol (VENTOLIN HFA) 108 (90 Base) MCG/ACT inhaler, Inhale 2 puffs into the lungs every 4 (four) hours as needed for shortness of breath (Asthma)., Disp: , Rfl:    ALPRAZolam (XANAX) 0.5 MG tablet, Take 1 tablet (0.5 mg total) by mouth at bedtime as needed for anxiety. Take 1 tab before the MRI for anxiety., Disp: 10 tablet, Rfl: 0   aspirin 81 MG tablet, Take 81 mg by mouth daily.  , Disp: , Rfl:    benzonatate (TESSALON) 200 MG capsule, Take 200 mg by mouth daily as needed for cough., Disp: , Rfl:    capecitabine (XELODA) 500 MG tablet, Take 2 tablets (1,000 mg total) by mouth 2 (two) times daily after a meal. Monday-Friday on days of radiation, Disp: 120 tablet, Rfl: 0   diclofenac Sodium (VOLTAREN) 1 % GEL, Research Patient: Apply 0.5 grams(1 fingertip) to each hand and each foot twice daily for the duration of radiation therapy, Disp: 200 g, Rfl: 0   diltiazem (CARDIZEM CD) 120 MG 24 hr capsule, Take 120 mg by mouth daily., Disp: , Rfl:    levothyroxine (SYNTHROID) 75 MCG tablet, Take 75 mcg by mouth daily before breakfast., Disp: , Rfl:    Multiple Vitamins-Minerals (MULTIVITAMIN WITH MINERALS) tablet, Take 1 tablet by mouth daily., Disp: , Rfl:    OVER THE COUNTER MEDICATION, Take 1 capsule by mouth 2 (two) times daily. copaiba softgels, Disp: ,  Rfl:    oxyCODONE (OXY IR/ROXICODONE) 5 MG immediate release tablet, Take 1 tablet (5 mg total) by mouth every 4 (four) hours as needed for moderate pain., Disp: 10 tablet, Rfl: 0   prochlorperazine (COMPAZINE) 10 MG tablet, TAKE 1 TABLET BY MOUTH EVERY 6 HOURS AS NEEDED FOR NAUSEA / FOR VOMITING, Disp: 30 tablet, Rfl: 1   PULMICORT FLEXHALER 180 MCG/ACT inhaler, Inhale 1 puff into the lungs daily., Disp: , Rfl:    triamcinolone cream (KENALOG) 0.1 %, Apply 1 Application topically daily as needed (Dry skin)., Disp: , Rfl:   PHYSICAL EXAM: ECOG FS:1 - Symptomatic but completely ambulatory    Vitals:   06/09/23 1408  BP: 123/81  Pulse: 75  Resp: 15  Temp: 98 F (36.7 C)  TempSrc: Oral  SpO2: 98%  Weight: 147 lb 8 oz (66.9 kg)  Height: 5\' 3"  (1.6 m)   Physical Exam Vitals and nursing note reviewed.  Constitutional:      Appearance: She is not ill-appearing or toxic-appearing.  HENT:     Head: Normocephalic.  Eyes:     Conjunctiva/sclera: Conjunctivae normal.  Cardiovascular:     Rate and Rhythm: Normal rate.  Pulmonary:     Effort: Pulmonary effort is normal.  Abdominal:     General: There is no distension.  Musculoskeletal:     Cervical back: Normal range of motion.  Skin:    General: Skin is warm and dry.     Findings: Rash present.     Comments: Please see media below  Neurological:     Mental Status: She is alert.        LABORATORY DATA: I have reviewed the data as listed    Latest Ref Rng & Units 06/01/2023    9:09 AM 05/25/2023  8:56 AM 04/18/2023    7:32 AM  CBC  WBC 4.0 - 10.5 K/uL 4.8  5.6  3.1   Hemoglobin 12.0 - 15.0 g/dL 65.7  84.6  96.2   Hematocrit 36.0 - 46.0 % 35.6  34.9  33.7   Platelets 150 - 400 K/uL 243  278  271         Latest Ref Rng & Units 06/01/2023    9:09 AM 05/25/2023    8:56 AM 04/18/2023    7:32 AM  CMP  Glucose 70 - 99 mg/dL 87  91  89   BUN 8 - 23 mg/dL 14  22  14    Creatinine 0.44 - 1.00 mg/dL 9.52  8.41  3.24   Sodium  135 - 145 mmol/L 138  140  140   Potassium 3.5 - 5.1 mmol/L 4.0  4.0  3.7   Chloride 98 - 111 mmol/L 105  107  107   CO2 22 - 32 mmol/L 28  28  25    Calcium 8.9 - 10.3 mg/dL 9.3  9.3  9.1   Total Protein 6.5 - 8.1 g/dL 6.6  6.8  6.5   Total Bilirubin 0.3 - 1.2 mg/dL 0.3  0.3  0.4   Alkaline Phos 38 - 126 U/L 76  67  63   AST 15 - 41 U/L 22  16  15    ALT 0 - 44 U/L 26  22  11         RADIOGRAPHIC STUDIES (from last 24 hours if applicable) I have personally reviewed the radiological images as listed and agreed with the findings in the report. No results found.      Visit Diagnosis: 1. 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female, estrogen receptor positive (HCC)   2. Rash      No orders of the defined types were placed in this encounter.   All questions were answered. The patient knows to call the clinic with any problems, questions or concerns. No barriers to learning was detected.  A total of more than 20 minutes were spent on this encounter with face-to-face time and non-face-to-face time, including preparing to see the patient, counseling the patient and coordination of care as outlined above.    Thank you for allowing me to participate in the care of this patient.    Shanon Ace, PA-C Department of Hematology/Oncology [facility] at [facility] Phone: [phone]  [phone]    06/09/2023 3:42 PM

## 2023-06-10 ENCOUNTER — Ambulatory Visit
Admission: RE | Admit: 2023-06-10 | Discharge: 2023-06-10 | Disposition: A | Discharge status: Home / Self Care | Payer: BC Managed Care – PPO | Source: Ambulatory Visit | Attending: Hematology and Oncology

## 2023-06-10 ENCOUNTER — Other Ambulatory Visit: Payer: Self-pay

## 2023-06-10 ENCOUNTER — Ambulatory Visit
Admission: RE | Admit: 2023-06-10 | Discharge: 2023-06-10 | Disposition: A | Discharge status: Home / Self Care | Payer: BC Managed Care – PPO | Source: Ambulatory Visit | Attending: Radiation Oncology | Admitting: Radiation Oncology

## 2023-06-10 DIAGNOSIS — Z510 Encounter for antineoplastic radiation therapy: Secondary | ICD-10-CM | POA: Diagnosis not present

## 2023-06-10 DIAGNOSIS — K769 Liver disease, unspecified: Secondary | ICD-10-CM

## 2023-06-10 LAB — RAD ONC ARIA SESSION SUMMARY
Course Elapsed Days: 2
Plan Fractions Treated to Date: 2
Plan Fractions Treated to Date: 3
Plan Prescribed Dose Per Fraction: 2 Gy
Plan Prescribed Dose Per Fraction: 2 Gy
Plan Total Fractions Prescribed: 13
Plan Total Fractions Prescribed: 25
Plan Total Prescribed Dose: 26 Gy
Plan Total Prescribed Dose: 50 Gy
Reference Point Dosage Given to Date: 4 Gy
Reference Point Dosage Given to Date: 6 Gy
Reference Point Session Dosage Given: 2 Gy
Reference Point Session Dosage Given: 2 Gy
Session Number: 3

## 2023-06-10 MED ORDER — HEPARIN SOD (PORK) LOCK FLUSH 100 UNIT/ML IV SOLN: 500.0000 [IU] | Freq: Once | INTRAVENOUS | Status: DC

## 2023-06-10 MED ORDER — SODIUM CHLORIDE 0.9 % IV SOLN: Freq: Once | INTRAVENOUS | Status: DC

## 2023-06-10 MED ORDER — SODIUM CHLORIDE 0.9% FLUSH: 10.0000 mL | INTRAVENOUS | Status: DC | PRN

## 2023-06-10 MED ORDER — GADOPICLENOL 0.5 MMOL/ML IV SOLN
6.5000 mL | Freq: Once | INTRAVENOUS | Status: AC | PRN
  Administered 2023-06-10: 6.5 mL via INTRAVENOUS

## 2023-06-10 MED ORDER — HEPARIN SOD (PORK) LOCK FLUSH 100 UNIT/ML IV SOLN
500.0000 [IU] | Freq: Once | INTRAVENOUS | Status: AC
  Administered 2023-06-10: 500 [IU] via INTRAVENOUS

## 2023-06-13 ENCOUNTER — Other Ambulatory Visit: Payer: Self-pay

## 2023-06-13 ENCOUNTER — Telehealth: Payer: Self-pay | Admitting: Hematology and Oncology

## 2023-06-13 ENCOUNTER — Ambulatory Visit
Admission: RE | Admit: 2023-06-13 | Discharge: 2023-06-13 | Disposition: A | Discharge status: Home / Self Care | Payer: BC Managed Care – PPO | Source: Ambulatory Visit | Attending: Radiation Oncology | Admitting: Radiation Oncology

## 2023-06-13 ENCOUNTER — Encounter: Payer: Self-pay | Admitting: Radiation Oncology

## 2023-06-13 ENCOUNTER — Inpatient Hospital Stay (HOSPITAL_BASED_OUTPATIENT_CLINIC_OR_DEPARTMENT_OTHER): Payer: BC Managed Care – PPO | Admitting: Physician Assistant

## 2023-06-13 ENCOUNTER — Ambulatory Visit: Payer: BC Managed Care – PPO | Attending: Radiation Oncology

## 2023-06-13 VITALS — BP 125/85 | HR 77 | Temp 98.2°F | Resp 16 | Wt 147.6 lb

## 2023-06-13 DIAGNOSIS — R21 Rash and other nonspecific skin eruption: Secondary | ICD-10-CM | POA: Diagnosis not present

## 2023-06-13 DIAGNOSIS — C50411 Malignant neoplasm of upper-outer quadrant of right female breast: Secondary | ICD-10-CM | POA: Diagnosis not present

## 2023-06-13 DIAGNOSIS — Z170 Estrogen receptor positive status [ER+]: Secondary | ICD-10-CM

## 2023-06-13 DIAGNOSIS — Z483 Aftercare following surgery for neoplasm: Secondary | ICD-10-CM | POA: Insufficient documentation

## 2023-06-13 DIAGNOSIS — R293 Abnormal posture: Secondary | ICD-10-CM | POA: Insufficient documentation

## 2023-06-13 DIAGNOSIS — M25611 Stiffness of right shoulder, not elsewhere classified: Secondary | ICD-10-CM | POA: Insufficient documentation

## 2023-06-13 DIAGNOSIS — Z510 Encounter for antineoplastic radiation therapy: Secondary | ICD-10-CM | POA: Diagnosis not present

## 2023-06-13 LAB — RAD ONC ARIA SESSION SUMMARY
Course Elapsed Days: 5
Plan Fractions Treated to Date: 2
Plan Fractions Treated to Date: 4
Plan Prescribed Dose Per Fraction: 2 Gy
Plan Prescribed Dose Per Fraction: 2 Gy
Plan Total Fractions Prescribed: 12
Plan Total Fractions Prescribed: 25
Plan Total Prescribed Dose: 24 Gy
Plan Total Prescribed Dose: 50 Gy
Reference Point Dosage Given to Date: 4 Gy
Reference Point Dosage Given to Date: 8 Gy
Reference Point Session Dosage Given: 2 Gy
Reference Point Session Dosage Given: 2 Gy
Session Number: 4

## 2023-06-13 MED ORDER — METHYLPREDNISOLONE 4 MG PO TBPK: ORAL_TABLET | ORAL | 0 refills | Status: DC

## 2023-06-13 NOTE — Telephone Encounter (Signed)
I discussed with the patient the result of off on the MRI of the liver showing no evidence of metastatic disease. Rash did not improve by holding Xeloda. Therefore I recommended that she resume Xeloda and we will likely hold pembrolizumab until the rash comes away completely. She is seeing Mardella Layman on Wednesday who can hold her treatment.

## 2023-06-13 NOTE — Progress Notes (Signed)
Symptom Management Consult Note [facility]    Patient Care Team: [name], MD as PCP - General (Unknown Physician Specialty) Pershing Proud, RN as Oncology Nurse Navigator Donnelly Angelica, RN as Oncology Nurse Navigator [name], MD as Consulting Physician (Hematology and Oncology) Antony Blackbird, MD as Consulting Physician (Radiation Oncology) [name], MD as Consulting Physician (General Surgery)    Name / MRN / DOB: Caitlin [name]  [id number]  Mar 03, 1956   Date of visit: 06/13/2023   Chief Complaint/Reason for visit: recheck rash   Current Therapy: Pembrolizumab maintenance every 3 weeks with capecitabine and concurrent radiation   Last treatment:  Day 1   Cycle 1 on 05/25/23   ASSESSMENT & PLAN: Patient is a 67 y.o. female with oncologic history of malignant neoplasm of upper-outer quadrant of right breast in female, estrogen receptor positive followed by Dr. Pamelia Hoit.  I have viewed most recent oncology note and lab work.    #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female, estrogen receptor positive - Next appointment with oncologist is 06/15/23   #Rash -Worsened since last visit. No improvement with topical triamcinolone. -Will try medrol dosepak. Prescription sent to pharmacy.  -Will be rechecked at upcoming appointment to assess for improvement. Question if this is this is related to [facility] since rash has worsened while holding Xeloda.   Strict ED precautions discussed should symptoms worsen.     Heme/Onc History: Oncology History  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female, estrogen receptor positive (HCC)  09/15/2022 Initial Diagnosis   Screening mammogram detected right breast mass which measured 1.4 cm by ultrasound at 10 o'clock position, biopsy revealed grade 3 IDC with lymphovascular invasion ER 10%, PR 0%, Ki-67 30%, HER2 0 negative   09/25/2022 Cancer Staging    Staging form: Breast, AJCC 8th Edition - Clinical: Stage IB (cT1c, cN0, cM0, G3, ER+, PR-, HER2-) - Signed by [name], MD on 09/25/2022 Histologic grading system: 3 grade system    Genetic Testing   [facility] CancerNext-Expanded Panel+RNA was Negative. Report date is 10/06/2021.  The CancerNext-Expanded gene panel offered by [facility] and includes sequencing, rearrangement, and RNA analysis for the following 77 genes: AIP, ALK, APC, ATM, AXIN2, BAP1, BARD1, BLM, BMPR1A, BRCA1, BRCA2, BRIP1, CDC73, CDH1, CDK4, CDKN1B, CDKN2A, CHEK2, CTNNA1, DICER1, FANCC, FH, FLCN, GALNT12, KIF1B, LZTR1, MAX, MEN1, MET, MLH1, MSH2, MSH3, MSH6, MUTYH, NBN, NF1, NF2, NTHL1, PALB2, PHOX2B, PMS2, POT1, PRKAR1A, PTCH1, PTEN, RAD51C, RAD51D, RB1, RECQL, RET, SDHA, SDHAF2, SDHB, SDHC, SDHD, SMAD4, SMARCA4, SMARCB1, SMARCE1, STK11, SUFU, TMEM127, TP53, TSC1, TSC2, VHL and XRCC2 (sequencing and deletion/duplication); EGFR, EGLN1, HOXB13, KIT, MITF, PDGFRA, POLD1, and POLE (sequencing only); EPCAM and GREM1 (deletion/duplication only).    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 with high-grade DCIS, inferior margin positive, angiolymphatic invasion present, 5/5 lymph nodes positive, additional medial margin: Poorly differentiated carcinoma with lymphoid stroma, additional inferior margin: Positive, ER 10% weak, PR 0%, HER2 0, Ki-67 30%   10/21/2022 Cancer Staging   Staging form: Breast, AJCC 8th Edition - Pathologic: Stage IIIC (pT1c, pN2a, cM0, G3, ER-, PR-, HER2-) - Signed by [name], MD on 10/21/2022 Stage prefix: Initial diagnosis Histologic grading system: 3 grade system   11/08/2022 Surgery   Margin reexcision: Inferior margin: Grade 3 IDC, new margin positive Medial margin: Grade 3 IDC new margin widely involved   11/18/2022 Surgery  Right inferior margin reexcision: Invasive poorly differentiated adenocarcinoma  grade 3 present in new inferior margin.  Medial margin reexcision: Invasive poorly differentiated adenocarcinoma grade 3 present at new medial margin, posterior margin: Poorly differentiated adenocarcinoma grade 3, margin negative  11/08/2022 and 11/18/2022: Final margin focal positivity. After much discussion the plan is to finish her systemic treatment and come back at a later point to do mastectomy.    12/02/2022 - 04/18/2023 Chemotherapy   Patient is on Treatment Plan : BREAST ADJUVANT DOSE DENSE AC q14d / PACLitaxel q7d     05/03/2023 Surgery   Right mastectomy: 8 cm grade 3 IDC with extensive lymphatic and vascular invasion RCB class III, deep margin multifocally positive, 5/5 lymph nodes positive, ER weak 10%, PR 0%, HER2 0, Ki-67 30%   05/25/2023 -  Chemotherapy   Patient is on Treatment Plan : BREAST Pembrolizumab (200) q21d x 24 months         Interval history-: Caitlin [name] is a 67 y.o. female with oncologic history as above presenting to [facility] today with chief complaint of recheck rash.  She presents unaccompanied to clinic today.  Patient seen in clinic x 4 days ago for rash on bilateral arms and was treated with topical triamcinolone.  Patient states rash has not improved and she has rash in new areas as well.  Rash is now under left armpit and on her right leg.    Patient states she is now itching all over.  She has been taking Zyrtec daily and holding Xeloda since last visit. She denies history of similar rash.  No new environmental exposures to cause rash.  Denies any difficulty breathing, wheezing, nausea or vomiting.  ROS  All other systems are reviewed and are negative for acute change except as noted in the HPI.    Allergies  Allergen Reactions   Penicillins Rash    childhood reaction     Past Medical History:  Diagnosis Date   Asthma    Complication of anesthesia    Dyspnea    Dysrhythmia    2012   Headache    History of kidney stones    passed    Hypothyroidism    Pneumonia 01/26/2023   PONV (postoperative nausea and vomiting)    rt breast ca 08/2022   Right Breast   Scoliosis    Thyroid disease      Past Surgical History:  Procedure Laterality Date   APPENDECTOMY     AXILLARY SENTINEL NODE BIOPSY Right 10/14/2022   Procedure: RIGHT AXILLARY SENTINEL NODE BIOPSY;  Surgeon: [name], MD;  Location: [facility] OR;  Service: General;  Laterality: Right;   BREAST BIOPSY Right 09/15/2022   [place] RT BREAST BX W LOC DEV 1ST LESION IMG BX SPEC US GUIDE 09/15/2022 [facility]   BREAST BIOPSY  10/12/2022   [place] RT RADIOACTIVE SEED LOC 10/12/2022 [facility] MAMMOGR[facility]   BREAST LUMPECTOMY WITH RADIOACTIVE SEED AND SENTINEL LYMPH NODE BIOPSY Right 10/14/2022   Procedure: RIGHT BREAST LUMPECTOMY WITH RADIOACTIVE SEED;  Surgeon: [name], MD;  Location: [facility] OR;  Service: General;  Laterality: Right;   COLONOSCOPY     LAPAROSCOPIC APPENDECTOMY     PORT-A-CATH REMOVAL Left 05/03/2023   Procedure: REMOVAL PORT-A-CATH;  Surgeon: [name], MD;  Location: [facility] OR;  Service: General;  Laterality: Left;   PORTACATH PLACEMENT Left 10/14/2022   Procedure: INSERTION PORT-A-CATH;  Surgeon: [name], MD;  Location: [facility] OR;  Service: General;  Laterality: Left;   PORTACATH PLACEMENT  Left 05/19/2023   Procedure: PORT PLACEMENT WITH ULTRASOUND GUIDANCE;  Surgeon: [name], MD;  Location: [facility];  Service: General;  Laterality: Left;   RE-EXCISION OF BREAST LUMPECTOMY Right 11/08/2022   Procedure: RE-EXCISION OF RIGHT BREAST LUMPECTOMY;  Surgeon: [name], MD;  Location: [facility];  Service: General;  Laterality: Right;   RE-EXCISION OF BREAST LUMPECTOMY Right 11/18/2022   Procedure: RE-EXCISION OF RIGHT BREAST LUMPECTOMY;  Surgeon: [name], MD;  Location: [facility] OR;  Service: General;  Laterality: Right;   SIMPLE MASTECTOMY WITH AXILLARY SENTINEL NODE BIOPSY Right 05/03/2023    Procedure: RIGHT MASTECTOMY;  Surgeon: [name], MD;  Location: [facility] OR;  Service: General;  Laterality: Right;  GEN & PEC BLOCK   WISDOM TOOTH EXTRACTION      Social History   Socioeconomic History   Marital status: Married    Spouse name: Not on file   Number of children: Not on file   Years of education: Not on file   Highest education level: Not on file  Occupational History   Not on file  Tobacco Use   Smoking status: Never   Smokeless tobacco: Not on file  Vaping Use   Vaping status: Never Used  Substance and Sexual Activity   Alcohol use: Not Currently   Drug use: Never   Sexual activity: Not Currently  Other Topics Concern   Not on file  Social History Narrative   Not on file   Social Determinants of Health   Financial Resource Strain: Not on file  Food Insecurity: No Food Insecurity (05/27/2023)   Hunger Vital Sign    Worried About Running Out of Food in the Last Year: Never true    Ran Out of Food in the Last Year: Never true  Transportation Needs: No Transportation Needs (05/27/2023)   PRAPARE - Administrator, Civil Service (Medical): No    Lack of Transportation (Non-Medical): No  Physical Activity: Sufficiently Active (06/19/2020)   Received [facility]   Exercise Vital Sign    Days of Exercise per Week: 7 days    Minutes of Exercise per Session: 30 min  Stress: Not on file  Social Connections: Not on file  Intimate Partner Violence: Not At Risk (05/27/2023)   Humiliation, Afraid, Rape, and Kick questionnaire    Fear of Current or Ex-Partner: No    Emotionally Abused: No    Physically Abused: No    Sexually Abused: No    Family History  Problem Relation Age of Onset   Thyroid disease Mother    Prostate cancer Father 34       metastatic   Hypertension Father    Thyroid disease Sister        twin sister age 30   Asthma Sister    Prostate cancer Brother 71   Heart attack Maternal Grandmother         died age 19's   Heart attack Maternal Grandfather        died age 44's   Clotting disorder Paternal Grandmother        cerebral hemorrage     Current Outpatient Medications:    methylPREDNISolone (MEDROL DOSEPAK) 4 MG TBPK tablet, Take 6 pills by mouth day 1, 5 on day 2, 4 on day 3, 3 on day 4, 2 on day 5, 1 on day 6, Disp: 21 tablet, Rfl: 0   acetaminophen (TYLENOL) 500 MG tablet, Take 500 mg by  mouth every 6 (six) hours as needed for moderate pain., Disp: , Rfl:    albuterol (VENTOLIN HFA) 108 (90 Base) MCG/ACT inhaler, Inhale 2 puffs into the lungs every 4 (four) hours as needed for shortness of breath (Asthma)., Disp: , Rfl:    ALPRAZolam (XANAX) 0.5 MG tablet, Take 1 tablet (0.5 mg total) by mouth at bedtime as needed for anxiety. Take 1 tab before the MRI for anxiety., Disp: 10 tablet, Rfl: 0   aspirin 81 MG tablet, Take 81 mg by mouth daily.  , Disp: , Rfl:    benzonatate (TESSALON) 200 MG capsule, Take 200 mg by mouth daily as needed for cough., Disp: , Rfl:    capecitabine (XELODA) 500 MG tablet, Take 2 tablets (1,000 mg total) by mouth 2 (two) times daily after a meal. Monday-Friday on days of radiation, Disp: 120 tablet, Rfl: 0   diclofenac Sodium (VOLTAREN) 1 % GEL, Research Patient: Apply 0.5 grams(1 fingertip) to each hand and each foot twice daily for the duration of radiation therapy, Disp: 200 g, Rfl: 0   diltiazem (CARDIZEM CD) 120 MG 24 hr capsule, Take 120 mg by mouth daily., Disp: , Rfl:    levothyroxine (SYNTHROID) 75 MCG tablet, Take 75 mcg by mouth daily before breakfast., Disp: , Rfl:    Multiple Vitamins-Minerals (MULTIVITAMIN WITH MINERALS) tablet, Take 1 tablet by mouth daily., Disp: , Rfl:    OVER THE COUNTER MEDICATION, Take 1 capsule by mouth 2 (two) times daily. copaiba softgels, Disp: , Rfl:    oxyCODONE (OXY IR/ROXICODONE) 5 MG immediate release tablet, Take 1 tablet (5 mg total) by mouth every 4 (four) hours as needed for moderate pain., Disp: 10 tablet, Rfl:  0   prochlorperazine (COMPAZINE) 10 MG tablet, TAKE 1 TABLET BY MOUTH EVERY 6 HOURS AS NEEDED FOR NAUSEA / FOR VOMITING, Disp: 30 tablet, Rfl: 1   PULMICORT FLEXHALER 180 MCG/ACT inhaler, Inhale 1 puff into the lungs daily., Disp: , Rfl:    triamcinolone cream (KENALOG) 0.1 %, Apply 1 Application topically daily as needed (Dry skin)., Disp: , Rfl:  No current facility-administered medications for this visit.  Facility-Administered Medications Ordered in Other Visits:    sodium chloride flush (NS) 0.9 % injection 10 mL, 10 mL, Intravenous, PRN, [name], MD  PHYSICAL EXAM: ECOG FS:1 - Symptomatic but completely ambulatory    Vitals:   06/13/23 1127  BP: 125/85  Pulse: 77  Resp: 16  Temp: 98.2 F (36.8 C)  TempSrc: Oral  SpO2: 98%  Weight: 147 lb 9.6 oz (67 kg)   Physical Exam Vitals and nursing note reviewed.  Constitutional:      Appearance: She is not ill-appearing or toxic-appearing.  HENT:     Head: Normocephalic.  Eyes:     Conjunctiva/sclera: Conjunctivae normal.  Cardiovascular:     Rate and Rhythm: Normal rate.  Pulmonary:     Effort: Pulmonary effort is normal.  Abdominal:     General: There is no distension.  Musculoskeletal:     Cervical back: Normal range of motion.  Skin:    General: Skin is warm and dry.     Findings: Rash present. Rash is macular.     Comments: Rash in left axilla, bilateral forearms, and anterior right thigh.  No rash noted to palms.  Neurological:     Mental Status: She is alert.        LABORATORY DATA: I have reviewed the data as listed    Latest Ref Rng &  Units 06/01/2023    9:09 AM 05/25/2023    8:56 AM 04/18/2023    7:32 AM  CBC  WBC 4.0 - 10.5 K/uL 4.8  5.6  3.1   Hemoglobin 12.0 - 15.0 g/dL 16.1  09.6  04.5   Hematocrit 36.0 - 46.0 % 35.6  34.9  33.7   Platelets 150 - 400 K/uL 243  278  271         Latest Ref Rng & Units 06/01/2023    9:09 AM 05/25/2023    8:56 AM 04/18/2023    7:32 AM  CMP  Glucose 70 -  99 mg/dL 87  91  89   BUN 8 - 23 mg/dL 14  22  14    Creatinine 0.44 - 1.00 mg/dL 4.09  8.11  9.14   Sodium 135 - 145 mmol/L 138  140  140   Potassium 3.5 - 5.1 mmol/L 4.0  4.0  3.7   Chloride 98 - 111 mmol/L 105  107  107   CO2 22 - 32 mmol/L 28  28  25    Calcium 8.9 - 10.3 mg/dL 9.3  9.3  9.1   Total Protein 6.5 - 8.1 g/dL 6.6  6.8  6.5   Total Bilirubin 0.3 - 1.2 mg/dL 0.3  0.3  0.4   Alkaline Phos 38 - 126 U/L 76  67  63   AST 15 - 41 U/L 22  16  15    ALT 0 - 44 U/L 26  22  11         RADIOGRAPHIC STUDIES (from last 24 hours if applicable) I have personally reviewed the radiological images as listed and agreed with the findings in the report. No results found.      Visit Diagnosis: 1. Rash   2. 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female, estrogen receptor positive (HCC)      No orders of the defined types were placed in this encounter.   All questions were answered. The patient knows to call the clinic with any problems, questions or concerns. No barriers to learning was detected.  A total of more than 30 minutes were spent on this encounter with face-to-face time and non-face-to-face time, including preparing to see the patient, ordering medications, counseling the patient and coordination of care as outlined above.    Thank you for allowing me to participate in the care of this patient.    Shanon Ace, PA-C Department of Hematology/Oncology [facility] Phone: [phone]  [phone]    06/13/2023 1:25 PM

## 2023-06-13 NOTE — Therapy (Signed)
OUTPATIENT PHYSICAL THERAPY  UPPER EXTREMITY ONCOLOGY TREATMENT  Patient Name: Caitlin [name] MRN: [id number] DOB:03/22/1956, 67 y.o., female Today's Date: 06/13/2023  END OF SESSION:  PT End of Session - 06/13/23 1403     Visit Number 2    Number of Visits 9    Date for PT Re-Evaluation 06/29/23    PT Start Time 1404    PT Stop Time 1458    PT Time Calculation (min) 54 min    Activity Tolerance Patient tolerated treatment well    Behavior During Therapy [facility] for tasks assessed/performed             Past Medical History:  Diagnosis Date   Asthma    Complication of anesthesia    Dyspnea    Dysrhythmia    2012   Headache    History of kidney stones    passed   Hypothyroidism    Pneumonia 01/26/2023   PONV (postoperative nausea and vomiting)    rt breast ca 08/2022   Right Breast   Scoliosis    Thyroid disease    Past Surgical History:  Procedure Laterality Date   APPENDECTOMY     AXILLARY SENTINEL NODE BIOPSY Right 10/14/2022   Procedure: RIGHT AXILLARY SENTINEL NODE BIOPSY;  Surgeon: [name], MD;  Location: [facility] OR;  Service: General;  Laterality: Right;   BREAST BIOPSY Right 09/15/2022   [place] RT BREAST BX W LOC DEV 1ST LESION IMG BX SPEC US GUIDE 09/15/2022 [facility]   BREAST BIOPSY  10/12/2022   [place] RT RADIOACTIVE SEED LOC 10/12/2022 [facility]   BREAST LUMPECTOMY WITH RADIOACTIVE SEED AND SENTINEL LYMPH NODE BIOPSY Right 10/14/2022   Procedure: RIGHT BREAST LUMPECTOMY WITH RADIOACTIVE SEED;  Surgeon: [name], MD;  Location: [facility] OR;  Service: General;  Laterality: Right;   COLONOSCOPY     LAPAROSCOPIC APPENDECTOMY     PORT-A-CATH REMOVAL Left 05/03/2023   Procedure: REMOVAL PORT-A-CATH;  Surgeon: [name], MD;  Location: [facility] OR;  Service: General;  Laterality: Left;   PORTACATH PLACEMENT Left 10/14/2022   Procedure: INSERTION PORT-A-CATH;  Surgeon: [name], MD;  Location: [facility] OR;  Service: General;   Laterality: Left;   PORTACATH PLACEMENT Left 05/19/2023   Procedure: PORT PLACEMENT WITH ULTRASOUND GUIDANCE;  Surgeon: [name], MD;  Location: [facility];  Service: General;  Laterality: Left;   RE-EXCISION OF BREAST LUMPECTOMY Right 11/08/2022   Procedure: RE-EXCISION OF RIGHT BREAST LUMPECTOMY;  Surgeon: [name], MD;  Location:  SURGERY CENTER;  Service: General;  Laterality: Right;   RE-EXCISION OF BREAST LUMPECTOMY Right 11/18/2022   Procedure: RE-EXCISION OF RIGHT BREAST LUMPECTOMY;  Surgeon: [name], MD;  Location: [facility] OR;  Service: General;  Laterality: Right;   SIMPLE MASTECTOMY WITH AXILLARY SENTINEL NODE BIOPSY Right 05/03/2023   Procedure: RIGHT MASTECTOMY;  Surgeon: [name], MD;  Location: [facility] OR;  Service: General;  Laterality: Right;  GEN & PEC BLOCK   WISDOM TOOTH EXTRACTION     Patient Active Problem List   Diagnosis Date Noted   Breast CA (HCC) 05/03/2023   S/P mastectomy, right 05/03/2023   Port-A-Cath in place 12/16/2022   Genetic testing 10/11/2022   Appendicitis with peritonitis 09/29/2022   Family history of prostate cancer 09/29/2022   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female, estrogen receptor positive (HCC) 09/22/2022   Atrial fibrillation with RVR (HCC) 11/30/2010   Chronic anticoagulation 11/30/2010   Hypothyroidism 11/30/2010   PALPITATIONS 11/27/2010  PCP: Donzetta Sprung MD  REFERRING PROVIDER: Antony Blackbird, MD  REFERRING DIAG: [phone] (ICD-10-CM) - 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female, estrogen receptor positive (HCC)  THERAPY DIAG:  Stiffness of right shoulder, not elsewhere classified  Aftercare following surgery for neoplasm  Abnormal posture  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female, estrogen receptor positive (HCC)  ONSET DATE: 05/03/23  Rationale for Evaluation and Treatment:  Rehabilitation  SUBJECTIVE:                                                                                                                                                                                           SUBJECTIVE STATEMENT:  The new exercises she gave me last time are going well.   Eval: I was doing some exercises but then I stopped. I am tight and I have some cording.   PERTINENT HISTORY:  Patient was diagnosed on 09/15/22 with right grade 3. It measures 1.4 x 0.9 x 1.2 cm and is located in the upper-outer quadrant. It is ER+, PR-, HER2- with a Ki67 of 30%. 10/14/22: R breast lumpectomy 5/5 nodes, 11/08/22: Re excision of R lumpectomy, 11/18/22 - Re excision of R lumpectomy,completed neoadjuvant, 05/03/23 R mastectomy with positive margins 5/5 nodes, pt is currently taking [place] and xeloda, will begin radiation on 06/08/23  PAIN:  Are you having pain? No  PRECAUTIONS: Other: at risk of lymphedema  RED FLAGS: None   WEIGHT BEARING RESTRICTIONS: No  FALLS:  Has patient fallen in last 6 months? 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  LEISURE: walks from house to barn, is active throughout the day  HAND DOMINANCE: right   PRIOR LEVEL OF FUNCTION: Independent  PATIENT GOALS: to get more range of motion of R shoulder   OBJECTIVE:  COGNITION: Overall cognitive status: Within functional limits for tasks assessed   PALPATION: Several cords palpable in R axilla extending to scar and down upper arm  OBSERVATIONS / OTHER ASSESSMENTS: steri strips still in place over healing mastectomy scar, cording visible in R axilla with abduction  POSTURE: rounded shoulders, forward head  UPPER EXTREMITY AROM/PROM:  A/PROM RIGHT   eval   Shoulder extension 77  Shoulder flexion 110  Shoulder abduction 116  Shoulder internal rotation 75  Shoulder external  rotation 57    (Blank rows = not tested)  A/PROM LEFT   eval  Shoulder extension 67  Shoulder flexion 157  Shoulder abduction 158  Shoulder internal rotation 62  Shoulder external rotation 83    (  Blank rows = not tested)   UPPER EXTREMITY STRENGTH:   LYMPHEDEMA ASSESSMENTS:   SURGERY TYPE/DATE: R mastectomy and SLNB on 10/14/22 followed by chemo and then radiation.  10/14/22: R breast lumpectomy 5/5, 11/08/22: Re excision of R lumpectomy, 11/18/22 - Re excision of R lumpectomy,completed neoadjuvant, 05/03/23 R mastectomy with positive margins 5/5 nodes,  NUMBER OF LYMPH NODES REMOVED: see above  CHEMOTHERAPY: completed neoadjuvant  RADIATION:beginning 06/08/23     LYMPHEDEMA ASSESSMENTS:   LANDMARK RIGHT  eval  At axilla  33  15 cm proximal to olecranon process 29.5  10 cm proximal to olecranon process 27  Olecranon process 23.5  15 cm proximal to ulnar styloid process 23.1  10 cm proximal to ulnar styloid process 20  Just proximal to ulnar styloid process 15.6  Across hand at thumb web space 20.9  At base of 2nd digit 6.5  (Blank rows = not tested)  LANDMARK LEFT  eval  At axilla  32.5  15 cm proximal to olecranon process 29.1  10 cm proximal to olecranon process 27.5  Olecranon process 23.9  15 cm proximal to ulnar styloid process 22.7  10 cm proximal to ulnar styloid process 19.7  Just proximal to ulnar styloid process 15.2  Across hand at thumb web space 19  At base of 2nd digit 6.2  (Blank rows = not tested)    QUICK DASH SURVEY:       TODAY'S TREATMENT:                                                                                                                                          DATE:  06/13/23: Manual Therapy P/ROM to Rt shoulder into flex, scaption (due to scoliosis unable to achieve abduction), and D2 with scapular depression as able due to scoliosis MFR along medial upper arm and antecubital fossa where cording palpable STM to Rt  pectoralis muscle during P/ROM Therapeutic Exercises Pulleys x 3 reps into flex and abd just to show pt correct technique at end of session as she has pulleys at home. VC's to decrease Rt scapular compensation during.   06/01/23- none today due to time constraints    PATIENT EDUCATION:  Education details: post op exercises, cording Person educated: Patient Education method: Explanation, Demonstration, and Handouts Education comprehension: verbalized understanding and returned demonstration  HOME EXERCISE PROGRAM: Post op exercises  ASSESSMENT:  CLINICAL IMPRESSION: First session today of manual therapy to work on decreasing cording in Rt axilla into medial upper arm. Also worked on [employer] shoulder P/ROM and pectoralis tightness. Her shoulder end motions are limited due to scoliosis but overall improvements were noted by end of session with her P/ROM being improved and shoulder girdle looser. Pt also reported noting that her Rt shoulder felt better after session.     OBJECTIVE IMPAIRMENTS: decreased ROM, decreased strength, increased fascial restrictions, impaired UE functional use, and postural  dysfunction.   ACTIVITY LIMITATIONS: carrying, lifting, and reach over head  PARTICIPATION LIMITATIONS: meal prep, laundry, and yard work  PERSONAL FACTORS: 1 comorbidity: previous history of R lumpectomy  are also affecting patient's functional outcome.   REHAB POTENTIAL: Good  CLINICAL DECISION MAKING: Stable/uncomplicated  EVALUATION COMPLEXITY: Low  GOALS: Goals reviewed with patient? Yes  SHORT TERM GOALS=LONG TERM GOALS Target date: 06/29/23  Pt will demonstrate 150 degrees of R shoulder flexion to allow pt to reach overhead.  Baseline: 110 Goal status: INITIAL  2.  Pt will demonstrate 150 degrees of R shoulder abduction to allow her to reach out to the side. Baseline: 116 Goal status: INITIAL  3.  Pt will report a 75% improvement in cording in R axilla to allow improved  comfort.  Baseline:  Goal status: INITIAL  4.  Pt will be independent in a home exercise program for continued strengthening and stretching.  Baseline:  Goal status: INITIAL  PLAN:  PT FREQUENCY: 2x/week  PT DURATION: 4 weeks  PLANNED INTERVENTIONS: Therapeutic exercises, Therapeutic activity, Patient/Family education, Self Care, Orthotic/Fit training, Manual lymph drainage, scar mobilization, Taping, Vasopneumatic device, and Manual therapy  PLAN FOR NEXT SESSION: MFR to cording in R axilla, PROM to R shoulder, pulleys, ball  Hermenia Bers, PTA 06/13/2023, 5:24 PM

## 2023-06-14 ENCOUNTER — Ambulatory Visit
Admission: RE | Admit: 2023-06-14 | Discharge: 2023-06-14 | Disposition: A | Discharge status: Home / Self Care | Payer: BC Managed Care – PPO | Source: Ambulatory Visit | Attending: Radiation Oncology | Admitting: Radiation Oncology

## 2023-06-14 ENCOUNTER — Ambulatory Visit: Payer: BC Managed Care – PPO | Admitting: Radiation Oncology

## 2023-06-14 ENCOUNTER — Other Ambulatory Visit: Payer: Self-pay

## 2023-06-14 ENCOUNTER — Encounter: Payer: Self-pay | Admitting: Hematology and Oncology

## 2023-06-14 DIAGNOSIS — C50411 Malignant neoplasm of upper-outer quadrant of right female breast: Secondary | ICD-10-CM

## 2023-06-14 DIAGNOSIS — Z510 Encounter for antineoplastic radiation therapy: Secondary | ICD-10-CM | POA: Diagnosis not present

## 2023-06-14 LAB — RAD ONC ARIA SESSION SUMMARY
Course Elapsed Days: 6
Plan Fractions Treated to Date: 3
Plan Fractions Treated to Date: 5
Plan Prescribed Dose Per Fraction: 2 Gy
Plan Prescribed Dose Per Fraction: 2 Gy
Plan Total Fractions Prescribed: 13
Plan Total Fractions Prescribed: 25
Plan Total Prescribed Dose: 26 Gy
Plan Total Prescribed Dose: 50 Gy
Reference Point Dosage Given to Date: 10 Gy
Reference Point Dosage Given to Date: 6 Gy
Reference Point Session Dosage Given: 2 Gy
Reference Point Session Dosage Given: 2 Gy
Session Number: 5

## 2023-06-14 MED ORDER — RADIAPLEXRX EX GEL: Freq: Once | CUTANEOUS | Status: AC

## 2023-06-14 MED ORDER — ALRA NON-METALLIC DEODORANT (RAD-ONC)
1.0000 | Freq: Once | TOPICAL | Status: AC
  Administered 2023-06-14: 1 via TOPICAL

## 2023-06-15 ENCOUNTER — Inpatient Hospital Stay: Payer: BC Managed Care – PPO

## 2023-06-15 ENCOUNTER — Inpatient Hospital Stay (HOSPITAL_BASED_OUTPATIENT_CLINIC_OR_DEPARTMENT_OTHER): Payer: BC Managed Care – PPO | Admitting: Adult Health

## 2023-06-15 ENCOUNTER — Other Ambulatory Visit: Payer: Self-pay

## 2023-06-15 ENCOUNTER — Encounter: Payer: Self-pay | Admitting: Adult Health

## 2023-06-15 ENCOUNTER — Ambulatory Visit
Admission: RE | Admit: 2023-06-15 | Discharge: 2023-06-15 | Disposition: A | Discharge status: Home / Self Care | Payer: BC Managed Care – PPO | Source: Ambulatory Visit | Attending: Radiation Oncology | Admitting: Radiation Oncology

## 2023-06-15 VITALS — BP 111/68 | HR 91 | Temp 97.3°F | Resp 16 | Wt 145.1 lb

## 2023-06-15 DIAGNOSIS — Z95828 Presence of other vascular implants and grafts: Secondary | ICD-10-CM

## 2023-06-15 DIAGNOSIS — Z170 Estrogen receptor positive status [ER+]: Secondary | ICD-10-CM

## 2023-06-15 DIAGNOSIS — C50411 Malignant neoplasm of upper-outer quadrant of right female breast: Secondary | ICD-10-CM

## 2023-06-15 DIAGNOSIS — R21 Rash and other nonspecific skin eruption: Secondary | ICD-10-CM

## 2023-06-15 DIAGNOSIS — Z510 Encounter for antineoplastic radiation therapy: Secondary | ICD-10-CM | POA: Diagnosis not present

## 2023-06-15 LAB — CMP (CANCER CENTER ONLY)
ALT: 79 U/L — ABNORMAL HIGH (ref 0–44)
AST: 39 U/L (ref 15–41)
Albumin: 4.4 g/dL (ref 3.5–5.0)
Alkaline Phosphatase: 113 U/L (ref 38–126)
Anion gap: 7 (ref 5–15)
BUN: 21 mg/dL (ref 8–23)
CO2: 25 mmol/L (ref 22–32)
Calcium: 9.3 mg/dL (ref 8.9–10.3)
Chloride: 106 mmol/L (ref 98–111)
Creatinine: 0.72 mg/dL (ref 0.44–1.00)
GFR, Estimated: >60 mL/min (ref >60)
Glucose, Bld: 101 mg/dL — ABNORMAL HIGH (ref 70–99)
Potassium: 4 mmol/L (ref 3.5–5.1)
Sodium: 138 mmol/L (ref 135–145)
Total Bilirubin: 0.5 mg/dL (ref 0.3–1.2)
Total Protein: 7.1 g/dL (ref 6.5–8.1)

## 2023-06-15 LAB — CBC WITH DIFFERENTIAL (CANCER CENTER ONLY)
Abs Immature Granulocytes: 0.02 10*3/uL (ref 0.00–0.07)
Basophils Absolute: 0 10*3/uL (ref 0.0–0.1)
Basophils Relative: 1 %
Eosinophils Absolute: 0.1 10*3/uL (ref 0.0–0.5)
Eosinophils Relative: 2 %
HCT: 34.8 % — ABNORMAL LOW (ref 36.0–46.0)
Hemoglobin: 11.6 g/dL — ABNORMAL LOW (ref 12.0–15.0)
Immature Granulocytes: 0 %
Lymphocytes Relative: 10 %
Lymphs Abs: 0.7 10*3/uL (ref 0.7–4.0)
MCH: 30.7 pg (ref 26.0–34.0)
MCHC: 33.3 g/dL (ref 30.0–36.0)
MCV: 92.1 fL (ref 80.0–100.0)
Monocytes Absolute: 0.4 10*3/uL (ref 0.1–1.0)
Monocytes Relative: 6 %
Neutro Abs: 5.6 10*3/uL (ref 1.7–7.7)
Neutrophils Relative %: 81 %
Platelet Count: 409 10*3/uL — ABNORMAL HIGH (ref 150–400)
RBC: 3.78 MIL/uL — ABNORMAL LOW (ref 3.87–5.11)
RDW: 13.4 % (ref 11.5–15.5)
WBC Count: 6.9 10*3/uL (ref 4.0–10.5)
nRBC: 0 % (ref 0.0–0.2)

## 2023-06-15 LAB — RAD ONC ARIA SESSION SUMMARY
Course Elapsed Days: 7
Plan Fractions Treated to Date: 3
Plan Fractions Treated to Date: 6
Plan Prescribed Dose Per Fraction: 2 Gy
Plan Prescribed Dose Per Fraction: 2 Gy
Plan Total Fractions Prescribed: 12
Plan Total Fractions Prescribed: 25
Plan Total Prescribed Dose: 24 Gy
Plan Total Prescribed Dose: 50 Gy
Reference Point Dosage Given to Date: 12 Gy
Reference Point Dosage Given to Date: 6 Gy
Reference Point Session Dosage Given: 2 Gy
Reference Point Session Dosage Given: 2 Gy
Session Number: 6

## 2023-06-15 MED ORDER — HEPARIN SOD (PORK) LOCK FLUSH 100 UNIT/ML IV SOLN
500.0000 [IU] | Freq: Once | INTRAVENOUS | Status: AC
  Administered 2023-06-15: 500 [IU]

## 2023-06-15 MED ORDER — METHYLPREDNISOLONE 4 MG PO TBPK
ORAL_TABLET | ORAL | 0 refills | Status: DC
Start: 2023-06-15 — End: 2023-06-30

## 2023-06-15 MED ORDER — SODIUM CHLORIDE 0.9% FLUSH
10.0000 mL | Freq: Once | INTRAVENOUS | Status: AC
  Administered 2023-06-15: 10 mL

## 2023-06-15 NOTE — Assessment & Plan Note (Signed)
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  No distant metastatic disease. Bone scan 10/29/2022: No evidence of bone metastasis   11/08/2022 and 11/18/2022: Margin reexcision surgeries: Final margin focal positivity.  After much discussion the plan is to finish her systemic treatment and come back at a later point to do mastectomy.   Treatment summary: Adjuvant chemotherapy with dose dense Adriamycin and Cytoxan followed by Taxol to be completed 05/02/2023 Mastectomy by Dr. Dwain Sarna 05/03/2023:Right mastectomy: 8 cm grade 3 IDC with extensive lymphatic and vascular invasion RCB class III, deep margin multifocally positive, 5/5 lymph nodes positive, ER weak 10%, PR 0%, HER2 0, Ki-67 30%, total lymph nodes: 10/10 (prior lumpectomy she had 5 positive nodes) Adjuvant radiation to start 06/09/2023 ------------------------------------------------------------------------------------------------------------------------------------------------ CT CAP 05/22/2023: Similar to slightly decreased size of right axillary and subpectoral lymph nodes few prominent supraclavicular nodes increased or new (nonspecific versus metastatic), liver hypodensity indeterminate sclerosis left ninth rib fracture Bone scan 05/22/2023: Left ninth rib fracture otherwise negative Ordered MRI of the liver for assessment of the liver findings.   Current treatment: Pembrolizumab maintenance every 3 weeks  started 05/25/2023 with capecitabine Toxicities: Skin rash: Rande Lawman is currently being held.  She is on a steroid taper.  Since her rash seems to be returning with a lower Medrol dose, I gave her a more prolonged taper and sent this into [facility] pharmacy.  She will take this in the morning with food and we will see her back on September 20 for follow-up to evaluate the rash.  She will continue with radiation and taking the Capecitabine twice daily on radiation days.

## 2023-06-15 NOTE — Progress Notes (Signed)
[facility] Cancer Follow up:    Caitlin Chimera, MD [address] [place] [address]   DIAGNOSIS:  Cancer Staging  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female, estrogen receptor positive (HCC) Staging form: Breast, AJCC 8th Edition - Clinical: Stage IB (cT1c, cN0, cM0, G3, ER+, PR-, HER2-) - Signed by [name], MD on 09/25/2022 Histologic grading system: 3 grade system - Pathologic: Stage IIIC (pT1c, pN2a, cM0, G3, ER-, PR-, HER2-) - Signed by [name], MD on 10/21/2022 Stage prefix: Initial diagnosis Histologic grading system: 3 grade system   SUMMARY OF ONCOLOGIC HISTORY: Oncology History  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female, estrogen receptor positive (HCC)  09/15/2022 Initial Diagnosis   Screening mammogram detected right breast mass which measured 1.4 cm by ultrasound at 10 o'clock position, biopsy revealed grade 3 IDC with lymphovascular invasion ER 10%, PR 0%, Ki-67 30%, HER2 0 negative   09/25/2022 Cancer Staging   Staging form: Breast, AJCC 8th Edition - Clinical: Stage IB (cT1c, cN0, cM0, G3, ER+, PR-, HER2-) - Signed by [name], MD on 09/25/2022 Histologic grading system: 3 grade system    Genetic Testing   [facility] CancerNext-Expanded Panel+RNA was Negative. Report date is 10/06/2021.  The CancerNext-Expanded gene panel offered by [facility] and includes sequencing, rearrangement, and RNA analysis for the following 77 genes: AIP, ALK, APC, ATM, AXIN2, BAP1, BARD1, BLM, BMPR1A, BRCA1, BRCA2, BRIP1, CDC73, CDH1, CDK4, CDKN1B, CDKN2A, CHEK2, CTNNA1, DICER1, FANCC, FH, FLCN, GALNT12, KIF1B, LZTR1, MAX, MEN1, MET, MLH1, MSH2, MSH3, MSH6, MUTYH, NBN, NF1, NF2, NTHL1, PALB2, PHOX2B, PMS2, POT1, PRKAR1A, PTCH1, PTEN, RAD51C, RAD51D, RB1, RECQL, RET, SDHA, SDHAF2, SDHB, SDHC, SDHD, SMAD4, SMARCA4, SMARCB1, SMARCE1, STK11, SUFU, TMEM127, TP53, TSC1, TSC2, VHL and XRCC2 (sequencing and  deletion/duplication); EGFR, EGLN1, HOXB13, KIT, MITF, PDGFRA, POLD1, and POLE (sequencing only); EPCAM and GREM1 (deletion/duplication only).    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 with high-grade DCIS, inferior margin positive, angiolymphatic invasion present, 5/5 lymph nodes positive, additional medial margin: Poorly differentiated carcinoma with lymphoid stroma, additional inferior margin: Positive, ER 10% weak, PR 0%, HER2 0, Ki-67 30%   10/21/2022 Cancer Staging   Staging form: Breast, AJCC 8th Edition - Pathologic: Stage IIIC (pT1c, pN2a, cM0, G3, ER-, PR-, HER2-) - Signed by [name], MD on 10/21/2022 Stage prefix: Initial diagnosis Histologic grading system: 3 grade system   11/08/2022 Surgery   Margin reexcision: Inferior margin: Grade 3 IDC, new margin positive Medial margin: Grade 3 IDC new margin widely involved   11/18/2022 Surgery   Right inferior margin reexcision: Invasive poorly differentiated adenocarcinoma grade 3 present in new inferior margin.  Medial margin reexcision: Invasive poorly differentiated adenocarcinoma grade 3 present at new medial margin, posterior margin: Poorly differentiated adenocarcinoma grade 3, margin negative  11/08/2022 and 11/18/2022: Final margin focal positivity. After much discussion the plan is to finish her systemic treatment and come back at a later point to do mastectomy.    12/02/2022 - 04/18/2023 Chemotherapy   Patient is on Treatment Plan : BREAST ADJUVANT DOSE DENSE AC q14d / PACLitaxel q7d     05/03/2023 Surgery   Right mastectomy: 8 cm grade 3 IDC with extensive lymphatic and vascular invasion RCB class III, deep margin multifocally positive, 5/5 lymph nodes positive, ER weak 10%, PR 0%, HER2 0, Ki-67 30%   05/25/2023 -  Chemotherapy   Patient is  on Treatment Plan : BREAST Pembrolizumab (200) q21d x 24 months       CURRENT THERAPY:  Pembrolizumab  INTERVAL HISTORY: Caitlin [name] 67 y.o. female returns for follow-up of a rash while on treatment with pembrolizumab.  She was seen by Luther Parody in symptom management on September 5 and September 9 for the same skin rash.  On September 9 she was started on a steroid taper.  She notes that now she has taken the 4 tablets on the steroid taper she feels the rash may be peaking through a little bit.  Otherwise she said when she started the steroid she started feeling better--her skin rash was not so itchy and seem to be improving.  Caitlin [name] also continues on right chest wall radiation and is taking capecitabine twice daily.  This was previously held but she restarted it this past Monday.  She does not notice an increase in the rash or the itchiness since restarting the Cytovene.   Patient Active Problem List   Diagnosis Date Noted   Breast CA (HCC) 05/03/2023   S/P mastectomy, right 05/03/2023   Port-A-Cath in place 12/16/2022   Genetic testing 10/11/2022   Appendicitis with peritonitis 09/29/2022   Family history of prostate cancer 09/29/2022   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female, estrogen receptor positive (HCC) 09/22/2022   Atrial fibrillation with RVR (HCC) 11/30/2010   Chronic anticoagulation 11/30/2010   Hypothyroidism 11/30/2010   PALPITATIONS 11/27/2010    is allergic to penicillins.  MEDICAL HISTORY: Past Medical History:  Diagnosis Date   Asthma    Complication of anesthesia    Dyspnea    Dysrhythmia    2012   Headache    History of kidney stones    passed   Hypothyroidism    Pneumonia 01/26/2023   PONV (postoperative nausea and vomiting)    rt breast ca 08/2022   Right Breast   Scoliosis    Thyroid disease     SURGICAL HISTORY: Past Surgical History:  Procedure Laterality Date   APPENDECTOMY     AXILLARY SENTINEL NODE BIOPSY Right 10/14/2022   Procedure: RIGHT AXILLARY SENTINEL NODE BIOPSY;  Surgeon: [name], MD;  Location: [facility] OR;  Service: General;  Laterality: Right;   BREAST BIOPSY Right 09/15/2022   [place] RT BREAST BX W LOC DEV 1ST LESION IMG BX SPEC US GUIDE 09/15/2022 [facility]MO[facility]   BREAST BIOPSY  10/12/2022   [place] RT RADIOACTIVE SEED LOC 10/12/2022 [facility] MAMMOGR[facility]   BREAST LUMPECTOMY WITH RADIOACTIVE SEED AND SENTINEL LYMPH NODE BIOPSY Right 10/14/2022   Procedure: RIGHT BREAST LUMPECTOMY WITH RADIOACTIVE SEED;  Surgeon: [name], MD;  Location: [facility] OR;  Service: General;  Laterality: Right;   COLONOSCOPY     LAPAROSCOPIC APPENDECTOMY     PORT-A-CATH REMOVAL Left 05/03/2023   Procedure: REMOVAL PORT-A-CATH;  Surgeon: [name], MD;  Location: [facility] OR;  Service: General;  Laterality: Left;   PORTACATH PLACEMENT Left 10/14/2022   Procedure: INSERTION PORT-A-CATH;  Surgeon: [name], MD;  Location: [facility] OR;  Service: General;  Laterality: Left;   PORTACATH PLACEMENT Left 05/19/2023   Procedure: PORT PLACEMENT WITH ULTRASOUND GUIDANCE;  Surgeon: [name], MD;  Location: [facility];  Service: General;  Laterality: Left;   RE-EXCISION OF BREAST LUMPECTOMY Right 11/08/2022   Procedure: RE-EXCISION OF RIGHT BREAST LUMPECTOMY;  Surgeon: [name], MD;  Location: [facility];  Service: General;  Laterality: Right;   RE-EXCISION OF BREAST LUMPECTOMY Right  11/18/2022   Procedure: RE-EXCISION OF RIGHT BREAST LUMPECTOMY;  Surgeon: [name], MD;  Location: [facility] OR;  Service: General;  Laterality: Right;   SIMPLE MASTECTOMY WITH AXILLARY SENTINEL NODE BIOPSY Right 05/03/2023   Procedure: RIGHT MASTECTOMY;  Surgeon: [name], MD;  Location: [facility] OR;  Service: General;  Laterality: Right;  GEN & PEC BLOCK   WISDOM TOOTH EXTRACTION      SOCIAL HISTORY: Social History   Socioeconomic History   Marital status: Married    Spouse name: Not on file   Number of children: Not on file   Years of education: Not on  file   Highest education level: Not on file  Occupational History   Not on file  Tobacco Use   Smoking status: Never   Smokeless tobacco: Not on file  Vaping Use   Vaping status: Never Used  Substance and Sexual Activity   Alcohol use: Not Currently   Drug use: Never   Sexual activity: Not Currently  Other Topics Concern   Not on file  Social History Narrative   Not on file   Social Determinants of Health   Financial Resource Strain: Not on file  Food Insecurity: No Food Insecurity (05/27/2023)   Hunger Vital Sign    Worried About Running Out of Food in the Last Year: Never true    Ran Out of Food in the Last Year: Never true  Transportation Needs: No Transportation Needs (05/27/2023)   PRAPARE - Administrator, Civil Service (Medical): No    Lack of Transportation (Non-Medical): No  Physical Activity: Sufficiently Active (06/19/2020)   Received from [facility], [place] [facility]   Exercise Vital Sign    Days of Exercise per Week: 7 days    Minutes of Exercise per Session: 30 min  Stress: Not on file  Social Connections: Not on file  Intimate Partner Violence: Not At Risk (05/27/2023)   Humiliation, Afraid, Rape, and Kick questionnaire    Fear of Current or Ex-Partner: No    Emotionally Abused: No    Physically Abused: No    Sexually Abused: No    FAMILY HISTORY: Family History  Problem Relation Age of Onset   Thyroid disease Mother    Prostate cancer Father 45       metastatic   Hypertension Father    Thyroid disease Sister        twin sister age 72   Asthma Sister    Prostate cancer Brother 53   Heart attack Maternal Grandmother        died age 1's   Heart attack Maternal Grandfather        died age 65's   Clotting disorder Paternal Grandmother        cerebral hemorrage    Review of Systems  Constitutional:  Negative for appetite change, chills, fatigue, fever and unexpected weight change.  HENT:   Negative for hearing loss, lump/mass and  trouble swallowing.   Eyes:  Negative for eye problems and icterus.  Respiratory:  Negative for chest tightness, cough and shortness of breath.   Cardiovascular:  Negative for chest pain, leg swelling and palpitations.  Gastrointestinal:  Negative for abdominal distention, abdominal pain, constipation, diarrhea, nausea and vomiting.  Endocrine: Negative for hot flashes.  Genitourinary:  Negative for difficulty urinating.   Musculoskeletal:  Negative for arthralgias.  Skin:  Positive for itching and rash.  Neurological:  Negative for dizziness, extremity weakness, headaches and numbness.  Hematological:  Negative  for adenopathy. Does not bruise/bleed easily.  Psychiatric/Behavioral:  Negative for depression. The patient is not nervous/anxious.       PHYSICAL EXAMINATION    Vitals:   06/15/23 0907  BP: 111/68  Pulse: 91  Resp: 16  Temp: (!) 97.3 F (36.3 C)  SpO2: 99%    Physical Exam Constitutional:      General: She is not in acute distress.    Appearance: Normal appearance. She is not toxic-appearing.  HENT:     Head: Normocephalic and atraumatic.     Mouth/Throat:     Mouth: Mucous membranes are moist.     Pharynx: Oropharynx is clear. No oropharyngeal exudate or posterior oropharyngeal erythema.  Eyes:     General: No scleral icterus. Cardiovascular:     Rate and Rhythm: Normal rate and regular rhythm.     Pulses: Normal pulses.     Heart sounds: Normal heart sounds.  Pulmonary:     Effort: Pulmonary effort is normal.     Breath sounds: Normal breath sounds.  Chest:     Comments: Erythematous confluent skin rash noted on left upper chest wall extending posteriorly to the back. Abdominal:     General: Abdomen is flat. Bowel sounds are normal. There is no distension.     Palpations: Abdomen is soft.     Tenderness: There is no abdominal tenderness.  Musculoskeletal:        General: No swelling.     Cervical back: Neck supple.  Lymphadenopathy:     Cervical:  No cervical adenopathy.  Skin:    General: Skin is warm and dry.     Findings: No rash.  Neurological:     General: No focal deficit present.     Mental Status: She is alert.  Psychiatric:        Mood and Affect: Mood normal.        Behavior: Behavior normal.     LABORATORY DATA:  CBC    Component Value Date/Time   WBC 6.9 06/15/2023 0819   WBC 11.1 (H) 10/13/2022 0930   RBC 3.78 (L) 06/15/2023 0819   HGB 11.6 (L) 06/15/2023 0819   HCT 34.8 (L) 06/15/2023 0819   PLT 409 (H) 06/15/2023 0819   MCV 92.1 06/15/2023 0819   MCH 30.7 06/15/2023 0819   MCHC 33.3 06/15/2023 0819   RDW 13.4 06/15/2023 0819   LYMPHSABS 0.7 06/15/2023 0819   MONOABS 0.4 06/15/2023 0819   EOSABS 0.1 06/15/2023 0819   BASOSABS 0.0 06/15/2023 0819    CMP     Component Value Date/Time   NA 138 06/15/2023 0819   K 4.0 06/15/2023 0819   CL 106 06/15/2023 0819   CO2 25 06/15/2023 0819   GLUCOSE 101 (H) 06/15/2023 0819   BUN 21 06/15/2023 0819   CREATININE 0.72 06/15/2023 0819   CALCIUM 9.3 06/15/2023 0819   PROT 7.1 06/15/2023 0819   ALBUMIN 4.4 06/15/2023 0819   AST 39 06/15/2023 0819   ALT 79 (H) 06/15/2023 0819   ALKPHOS 113 06/15/2023 0819   BILITOT 0.5 06/15/2023 0819   GFRNONAA >60 06/15/2023 0819      ASSESSMENT and THERAPY PLAN:   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female, estrogen receptor positive (HCC) 10/14/2022:Right lumpectomy: Grade 3 invasive poorly differentiated ductal adenocarcinoma 1.4 cm with focal lobular features with high-grade DCIS, inferior margin positive, angiolymphatic invasion present, 5/5 lymph nodes positive, additional medial margin: Poorly differentiated carcinoma with lymphoid stroma, additional inferior margin: Positive,  ER 10% weak, PR 0%, HER2 0, Ki-67 30% T1c N2a: Stage IIIc pathological staging   CT CAP 10/29/2022: Several prominent lymph nodes and enlarged nodes right axilla and right supraclavicular region many of these are  nonpathologic by size criteria.  No distant metastatic disease. Bone scan 10/29/2022: No evidence of bone metastasis   11/08/2022 and 11/18/2022: Margin reexcision surgeries: Final margin focal positivity.  After much discussion the plan is to finish her systemic treatment and come back at a later point to do mastectomy.   Treatment summary: Adjuvant chemotherapy with dose dense Adriamycin and Cytoxan followed by Taxol to be completed 05/02/2023 Mastectomy by Dr. Dwain Sarna 05/03/2023:Right mastectomy: 8 cm grade 3 IDC with extensive lymphatic and vascular invasion RCB class III, deep margin multifocally positive, 5/5 lymph nodes positive, ER weak 10%, PR 0%, HER2 0, Ki-67 30%, total lymph nodes: 10/10 (prior lumpectomy she had 5 positive nodes) Adjuvant radiation to start 06/09/2023 ------------------------------------------------------------------------------------------------------------------------------------------------ CT CAP 05/22/2023: Similar to slightly decreased size of right axillary and subpectoral lymph nodes few prominent supraclavicular nodes increased or new (nonspecific versus metastatic), liver hypodensity indeterminate sclerosis left ninth rib fracture Bone scan 05/22/2023: Left ninth rib fracture otherwise negative Ordered MRI of the liver for assessment of the liver findings.   Current treatment: Pembrolizumab maintenance every 3 weeks started 05/25/2023 with capecitabine Toxicities: Skin rash: Rande Lawman is currently being held.  She is on a steroid taper.  Since her rash seems to be returning with a lower Medrol dose, I gave her a more prolonged taper and sent this into [facility] pharmacy.  She will take this in the morning with food and we will see her back on September 20 for follow-up to evaluate the rash.  She will continue with radiation and taking the Capecitabine twice daily on radiation days.  All questions were answered. The patient knows to call the clinic with any problems,  questions or concerns. We can certainly see the patient much sooner if necessary.  Total encounter time:30 minutes*in face-to-face visit time, chart review, lab review, care coordination, order entry, and documentation of the encounter time.    Lillard Anes, NP 06/15/23 9:47 AM Medical Oncology and Hematology [facility] [address] [place], [place] [address] Tel. [phone]    Fax. [phone]  *Total Encounter Time as defined by the Centers for Medicare and Medicaid Services includes, in addition to the face-to-face time of a patient visit (documented in the note above) non-face-to-face time: obtaining and reviewing outside history, ordering and reviewing medications, tests or procedures, care coordination (communications with other health care professionals or caregivers) and documentation in the medical record.

## 2023-06-16 ENCOUNTER — Encounter: Payer: Self-pay | Admitting: Rehabilitation

## 2023-06-16 ENCOUNTER — Ambulatory Visit: Payer: BC Managed Care – PPO

## 2023-06-16 ENCOUNTER — Telehealth: Payer: Self-pay | Admitting: Adult Health

## 2023-06-16 ENCOUNTER — Other Ambulatory Visit: Payer: Self-pay

## 2023-06-16 ENCOUNTER — Ambulatory Visit: Payer: BC Managed Care – PPO | Admitting: Rehabilitation

## 2023-06-16 ENCOUNTER — Ambulatory Visit
Admission: RE | Admit: 2023-06-16 | Discharge: 2023-06-16 | Disposition: A | Discharge status: Home / Self Care | Payer: BC Managed Care – PPO | Source: Ambulatory Visit | Attending: Radiation Oncology | Admitting: Radiation Oncology

## 2023-06-16 DIAGNOSIS — Z170 Estrogen receptor positive status [ER+]: Secondary | ICD-10-CM

## 2023-06-16 DIAGNOSIS — M25611 Stiffness of right shoulder, not elsewhere classified: Secondary | ICD-10-CM | POA: Diagnosis not present

## 2023-06-16 DIAGNOSIS — R293 Abnormal posture: Secondary | ICD-10-CM

## 2023-06-16 DIAGNOSIS — Z510 Encounter for antineoplastic radiation therapy: Secondary | ICD-10-CM | POA: Diagnosis not present

## 2023-06-16 DIAGNOSIS — Z483 Aftercare following surgery for neoplasm: Secondary | ICD-10-CM

## 2023-06-16 LAB — RAD ONC ARIA SESSION SUMMARY
Course Elapsed Days: 8
Plan Fractions Treated to Date: 4
Plan Fractions Treated to Date: 7
Plan Prescribed Dose Per Fraction: 2 Gy
Plan Prescribed Dose Per Fraction: 2 Gy
Plan Total Fractions Prescribed: 13
Plan Total Fractions Prescribed: 25
Plan Total Prescribed Dose: 26 Gy
Plan Total Prescribed Dose: 50 Gy
Reference Point Dosage Given to Date: 14 Gy
Reference Point Dosage Given to Date: 8 Gy
Reference Point Session Dosage Given: 2 Gy
Reference Point Session Dosage Given: 2 Gy
Session Number: 7

## 2023-06-16 NOTE — Therapy (Signed)
OUTPATIENT PHYSICAL THERAPY  UPPER EXTREMITY ONCOLOGY TREATMENT  Patient Name: Caitlin [name] MRN: [id number] DOB:1956-06-13, 67 y.o., female Today's Date: 06/17/2023  END OF SESSION:  PT End of Session - 06/16/23 1257     Visit Number 3    Number of Visits 9    Date for PT Re-Evaluation 06/29/23    PT Start Time 1300    PT Stop Time 1346    PT Time Calculation (min) 46 min    Activity Tolerance Patient tolerated treatment well    Behavior During Therapy [facility] for tasks assessed/performed             Past Medical History:  Diagnosis Date   Asthma    Complication of anesthesia    Dyspnea    Dysrhythmia    2012   Headache    History of kidney stones    passed   Hypothyroidism    Pneumonia 01/26/2023   PONV (postoperative nausea and vomiting)    rt breast ca 08/2022   Right Breast   Scoliosis    Thyroid disease    Past Surgical History:  Procedure Laterality Date   APPENDECTOMY     AXILLARY SENTINEL NODE BIOPSY Right 10/14/2022   Procedure: RIGHT AXILLARY SENTINEL NODE BIOPSY;  Surgeon: [name], MD;  Location: [facility] OR;  Service: General;  Laterality: Right;   BREAST BIOPSY Right 09/15/2022   [place] RT BREAST BX W LOC DEV 1ST LESION IMG BX SPEC US GUIDE 09/15/2022 [facility]   BREAST BIOPSY  10/12/2022   [place] RT RADIOACTIVE SEED LOC 10/12/2022 [facility]   BREAST LUMPECTOMY WITH RADIOACTIVE SEED AND SENTINEL LYMPH NODE BIOPSY Right 10/14/2022   Procedure: RIGHT BREAST LUMPECTOMY WITH RADIOACTIVE SEED;  Surgeon: [name], MD;  Location: [facility] OR;  Service: General;  Laterality: Right;   COLONOSCOPY     LAPAROSCOPIC APPENDECTOMY     PORT-A-CATH REMOVAL Left 05/03/2023   Procedure: REMOVAL PORT-A-CATH;  Surgeon: [name], MD;  Location: [facility] OR;  Service: General;  Laterality: Left;   PORTACATH PLACEMENT Left 10/14/2022   Procedure: INSERTION PORT-A-CATH;  Surgeon: [name], MD;  Location: [facility] OR;  Service: General;   Laterality: Left;   PORTACATH PLACEMENT Left 05/19/2023   Procedure: PORT PLACEMENT WITH ULTRASOUND GUIDANCE;  Surgeon: [name], MD;  Location: [facility];  Service: General;  Laterality: Left;   RE-EXCISION OF BREAST LUMPECTOMY Right 11/08/2022   Procedure: RE-EXCISION OF RIGHT BREAST LUMPECTOMY;  Surgeon: [name], MD;  Location: [facility];  Service: General;  Laterality: Right;   RE-EXCISION OF BREAST LUMPECTOMY Right 11/18/2022   Procedure: RE-EXCISION OF RIGHT BREAST LUMPECTOMY;  Surgeon: [name], MD;  Location: [facility] OR;  Service: General;  Laterality: Right;   SIMPLE MASTECTOMY WITH AXILLARY SENTINEL NODE BIOPSY Right 05/03/2023   Procedure: RIGHT MASTECTOMY;  Surgeon: [name], MD;  Location: [facility] OR;  Service: General;  Laterality: Right;  GEN & PEC BLOCK   WISDOM TOOTH EXTRACTION     Patient Active Problem List   Diagnosis Date Noted   Breast CA (HCC) 05/03/2023   S/P mastectomy, right 05/03/2023   Port-A-Cath in place 12/16/2022   Genetic testing 10/11/2022   Appendicitis with peritonitis 09/29/2022   Family history of prostate cancer 09/29/2022   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female, estrogen receptor positive (HCC) 09/22/2022   Atrial fibrillation with RVR (HCC) 11/30/2010   Chronic anticoagulation 11/30/2010   Hypothyroidism 11/30/2010   PALPITATIONS 11/27/2010  PCP: Donzetta Sprung MD  REFERRING PROVIDER: Antony Blackbird, MD  REFERRING DIAG: [phone] (ICD-10-CM) - 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female, estrogen receptor positive (HCC)  THERAPY DIAG:  Stiffness of right shoulder, not elsewhere classified  Aftercare following surgery for neoplasm  Abnormal posture  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female, estrogen receptor positive (HCC)  ONSET DATE: 05/03/23  Rationale for Evaluation and Treatment:  Rehabilitation  SUBJECTIVE:                                                                                                                                                                                           SUBJECTIVE STATEMENT:   Nothing new  Eval: I was doing some exercises but then I stopped. I am tight and I have some cording.   PERTINENT HISTORY:  Patient was diagnosed on 09/15/22 with right grade 3. It measures 1.4 x 0.9 x 1.2 cm and is located in the upper-outer quadrant. It is ER+, PR-, HER2- with a Ki67 of 30%. 10/14/22: R breast lumpectomy 5/5 nodes, 11/08/22: Re excision of R lumpectomy, 11/18/22 - Re excision of R lumpectomy,completed neoadjuvant, 05/03/23 R mastectomy with positive margins 5/5 nodes, pt is currently taking [place] and xeloda, will begin radiation on 06/08/23  PAIN:  Are you having pain? No  PRECAUTIONS: Other: at risk of lymphedema  RED FLAGS: None   WEIGHT BEARING RESTRICTIONS: No  FALLS:  Has patient fallen in last 6 months? 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  LEISURE: walks from house to barn, is active throughout the day  HAND DOMINANCE: right   PRIOR LEVEL OF FUNCTION: Independent  PATIENT GOALS: to get more range of motion of R shoulder   OBJECTIVE:  COGNITION: Overall cognitive status: Within functional limits for tasks assessed   PALPATION: Several cords palpable in R axilla extending to scar and down upper arm  OBSERVATIONS / OTHER ASSESSMENTS: steri strips still in place over healing mastectomy scar, cording visible in R axilla with abduction  POSTURE: rounded shoulders, forward head  UPPER EXTREMITY AROM/PROM:  A/PROM RIGHT   eval   Shoulder extension 77  Shoulder flexion 110  Shoulder abduction 116  Shoulder internal rotation 75  Shoulder external rotation 57    (Blank rows = not  tested)  A/PROM LEFT   eval  Shoulder extension 67  Shoulder flexion 157  Shoulder abduction 158  Shoulder internal rotation 62  Shoulder external rotation 83    (Blank rows = not tested)  UPPER EXTREMITY STRENGTH:   LYMPHEDEMA ASSESSMENTS:   SURGERY TYPE/DATE: R mastectomy and SLNB on 10/14/22 followed by chemo and then radiation.  10/14/22: R breast lumpectomy 5/5, 11/08/22: Re excision of R lumpectomy, 11/18/22 - Re excision of R lumpectomy,completed neoadjuvant, 05/03/23 R mastectomy with positive margins 5/5 nodes,  NUMBER OF LYMPH NODES REMOVED: see above  CHEMOTHERAPY: completed neoadjuvant  RADIATION:beginning 06/08/23     LYMPHEDEMA ASSESSMENTS:   LANDMARK RIGHT  eval  At axilla  33  15 cm proximal to olecranon process 29.5  10 cm proximal to olecranon process 27  Olecranon process 23.5  15 cm proximal to ulnar styloid process 23.1  10 cm proximal to ulnar styloid process 20  Just proximal to ulnar styloid process 15.6  Across hand at thumb web space 20.9  At base of 2nd digit 6.5  (Blank rows = not tested)  LANDMARK LEFT  eval  At axilla  32.5  15 cm proximal to olecranon process 29.1  10 cm proximal to olecranon process 27.5  Olecranon process 23.9  15 cm proximal to ulnar styloid process 22.7  10 cm proximal to ulnar styloid process 19.7  Just proximal to ulnar styloid process 15.2  Across hand at thumb web space 19  At base of 2nd digit 6.2  (Blank rows = not tested)    QUICK DASH SURVEY:       TODAY'S TREATMENT:                                                                                                                                          DATE:  06/16/23: Manual Therapy P/ROM to Rt shoulder into flex, scaption (due to scoliosis unable to achieve abduction), and D2 with scapular depression as able due to scoliosis MFR along medial upper arm and antecubital fossa where cording palpable STM to Rt pectoralis muscle during P/ROM - no  lotion used due to radiation after therapy  06/13/23: Manual Therapy P/ROM to Rt shoulder into flex, scaption (due to scoliosis unable to achieve abduction), and D2 with scapular depression as able due to scoliosis MFR along medial upper arm and antecubital fossa where cording palpable STM to Rt pectoralis muscle during P/ROM  06/13/23: Manual Therapy P/ROM to Rt shoulder into flex, scaption (due to scoliosis unable to achieve abduction), and D2 with scapular depression as able due to scoliosis MFR along medial upper arm and antecubital fossa where cording palpable STM to Rt pectoralis muscle during P/ROM Therapeutic Exercises Pulleys x 3 reps into flex and abd just to show pt correct technique at end of session as she has pulleys at home. VC's to decrease Rt scapular compensation during.   06/01/23- none today due to time constraints    PATIENT EDUCATION:  Education details: post op exercises, cording Person educated: Patient Education method: Explanation, Demonstration, and Handouts Education comprehension: verbalized understanding and returned demonstration  HOME EXERCISE  PROGRAM: Post op exercises  ASSESSMENT:  CLINICAL IMPRESSION: Pt is doing well and reports radiation has been easier to get into position.  Cording was present but pretty mobile.    oBJECTIVE IMPAIRMENTS: decreased ROM, decreased strength, increased fascial restrictions, impaired UE functional use, and postural dysfunction.   ACTIVITY LIMITATIONS: carrying, lifting, and reach over head  PARTICIPATION LIMITATIONS: meal prep, laundry, and yard work  PERSONAL FACTORS: 1 comorbidity: previous history of R lumpectomy  are also affecting patient's functional outcome.   REHAB POTENTIAL: Good  CLINICAL DECISION MAKING: Stable/uncomplicated  EVALUATION COMPLEXITY: Low  GOALS: Goals reviewed with patient? Yes  SHORT TERM GOALS=LONG TERM GOALS Target date: 06/29/23  Pt will demonstrate 150 degrees of R  shoulder flexion to allow pt to reach overhead.  Baseline: 110 Goal status: INITIAL  2.  Pt will demonstrate 150 degrees of R shoulder abduction to allow her to reach out to the side. Baseline: 116 Goal status: INITIAL  3.  Pt will report a 75% improvement in cording in R axilla to allow improved comfort.  Baseline:  Goal status: INITIAL  4.  Pt will be independent in a home exercise program for continued strengthening and stretching.  Baseline:  Goal status: INITIAL  PLAN:  PT FREQUENCY: 2x/week  PT DURATION: 4 weeks  PLANNED INTERVENTIONS: Therapeutic exercises, Therapeutic activity, Patient/Family education, Self Care, Orthotic/Fit training, Manual lymph drainage, scar mobilization, Taping, Vasopneumatic device, and Manual therapy  PLAN FOR NEXT SESSION: MFR to cording in R axilla, PROM to R shoulder, pulleys, ball  Elad Macphail, Julieanne Manson, PT 06/17/2023, 5:05 PM

## 2023-06-16 NOTE — Telephone Encounter (Signed)
Scheduled appointment per 9/11 los. Left voicemail with appointment details.

## 2023-06-17 ENCOUNTER — Other Ambulatory Visit: Payer: Self-pay

## 2023-06-17 ENCOUNTER — Ambulatory Visit: Payer: BC Managed Care – PPO

## 2023-06-17 ENCOUNTER — Ambulatory Visit
Admission: RE | Admit: 2023-06-17 | Discharge: 2023-06-17 | Disposition: A | Discharge status: Home / Self Care | Payer: BC Managed Care – PPO | Source: Ambulatory Visit | Attending: Radiation Oncology | Admitting: Radiation Oncology

## 2023-06-17 DIAGNOSIS — Z510 Encounter for antineoplastic radiation therapy: Secondary | ICD-10-CM | POA: Diagnosis not present

## 2023-06-17 LAB — RAD ONC ARIA SESSION SUMMARY
Course Elapsed Days: 9
Plan Fractions Treated to Date: 4
Plan Fractions Treated to Date: 8
Plan Prescribed Dose Per Fraction: 2 Gy
Plan Prescribed Dose Per Fraction: 2 Gy
Plan Total Fractions Prescribed: 12
Plan Total Fractions Prescribed: 25
Plan Total Prescribed Dose: 24 Gy
Plan Total Prescribed Dose: 50 Gy
Reference Point Dosage Given to Date: 16 Gy
Reference Point Dosage Given to Date: 8 Gy
Reference Point Session Dosage Given: 2 Gy
Reference Point Session Dosage Given: 2 Gy
Session Number: 8

## 2023-06-20 ENCOUNTER — Ambulatory Visit: Payer: BC Managed Care – PPO | Admitting: Physical Therapy

## 2023-06-20 ENCOUNTER — Other Ambulatory Visit: Payer: Self-pay

## 2023-06-20 ENCOUNTER — Encounter: Payer: Self-pay | Admitting: Adult Health

## 2023-06-20 ENCOUNTER — Ambulatory Visit
Admission: RE | Admit: 2023-06-20 | Discharge: 2023-06-20 | Disposition: A | Discharge status: Home / Self Care | Payer: BC Managed Care – PPO | Source: Ambulatory Visit | Attending: Radiation Oncology | Admitting: Radiation Oncology

## 2023-06-20 DIAGNOSIS — Z510 Encounter for antineoplastic radiation therapy: Secondary | ICD-10-CM | POA: Diagnosis not present

## 2023-06-20 LAB — RAD ONC ARIA SESSION SUMMARY
Course Elapsed Days: 12
Plan Fractions Treated to Date: 5
Plan Fractions Treated to Date: 9
Plan Prescribed Dose Per Fraction: 2 Gy
Plan Prescribed Dose Per Fraction: 2 Gy
Plan Total Fractions Prescribed: 13
Plan Total Fractions Prescribed: 25
Plan Total Prescribed Dose: 26 Gy
Plan Total Prescribed Dose: 50 Gy
Reference Point Dosage Given to Date: 10 Gy
Reference Point Dosage Given to Date: 18 Gy
Reference Point Session Dosage Given: 2 Gy
Reference Point Session Dosage Given: 2 Gy
Session Number: 9

## 2023-06-21 ENCOUNTER — Other Ambulatory Visit: Payer: Self-pay

## 2023-06-21 ENCOUNTER — Ambulatory Visit
Admission: RE | Admit: 2023-06-21 | Discharge: 2023-06-21 | Disposition: A | Discharge status: Home / Self Care | Payer: BC Managed Care – PPO | Source: Ambulatory Visit | Attending: Radiation Oncology | Admitting: Radiation Oncology

## 2023-06-21 ENCOUNTER — Ambulatory Visit: Payer: BC Managed Care – PPO

## 2023-06-21 DIAGNOSIS — Z510 Encounter for antineoplastic radiation therapy: Secondary | ICD-10-CM | POA: Diagnosis not present

## 2023-06-21 LAB — RAD ONC ARIA SESSION SUMMARY
Course Elapsed Days: 13
Plan Fractions Treated to Date: 10
Plan Fractions Treated to Date: 5
Plan Prescribed Dose Per Fraction: 2 Gy
Plan Prescribed Dose Per Fraction: 2 Gy
Plan Total Fractions Prescribed: 12
Plan Total Fractions Prescribed: 25
Plan Total Prescribed Dose: 24 Gy
Plan Total Prescribed Dose: 50 Gy
Reference Point Dosage Given to Date: 10 Gy
Reference Point Dosage Given to Date: 20 Gy
Reference Point Session Dosage Given: 2 Gy
Reference Point Session Dosage Given: 2 Gy
Session Number: 10

## 2023-06-22 ENCOUNTER — Ambulatory Visit: Payer: BC Managed Care – PPO

## 2023-06-22 ENCOUNTER — Ambulatory Visit
Admission: RE | Admit: 2023-06-22 | Discharge: 2023-06-22 | Disposition: A | Discharge status: Home / Self Care | Payer: BC Managed Care – PPO | Source: Ambulatory Visit | Attending: Radiation Oncology | Admitting: Radiation Oncology

## 2023-06-22 ENCOUNTER — Other Ambulatory Visit: Payer: Self-pay

## 2023-06-22 DIAGNOSIS — Z510 Encounter for antineoplastic radiation therapy: Secondary | ICD-10-CM | POA: Diagnosis not present

## 2023-06-22 LAB — RAD ONC ARIA SESSION SUMMARY
Course Elapsed Days: 14
Plan Fractions Treated to Date: 11
Plan Fractions Treated to Date: 6
Plan Prescribed Dose Per Fraction: 2 Gy
Plan Prescribed Dose Per Fraction: 2 Gy
Plan Total Fractions Prescribed: 13
Plan Total Fractions Prescribed: 25
Plan Total Prescribed Dose: 26 Gy
Plan Total Prescribed Dose: 50 Gy
Reference Point Dosage Given to Date: 12 Gy
Reference Point Dosage Given to Date: 22 Gy
Reference Point Session Dosage Given: 2 Gy
Reference Point Session Dosage Given: 2 Gy
Session Number: 11

## 2023-06-23 ENCOUNTER — Ambulatory Visit
Admission: RE | Admit: 2023-06-23 | Discharge: 2023-06-23 | Disposition: A | Discharge status: Home / Self Care | Payer: BC Managed Care – PPO | Source: Ambulatory Visit | Attending: Radiation Oncology | Admitting: Radiation Oncology

## 2023-06-23 ENCOUNTER — Other Ambulatory Visit: Payer: Self-pay

## 2023-06-23 DIAGNOSIS — Z510 Encounter for antineoplastic radiation therapy: Secondary | ICD-10-CM | POA: Diagnosis not present

## 2023-06-23 LAB — RAD ONC ARIA SESSION SUMMARY
Course Elapsed Days: 15
Plan Fractions Treated to Date: 12
Plan Fractions Treated to Date: 6
Plan Prescribed Dose Per Fraction: 2 Gy
Plan Prescribed Dose Per Fraction: 2 Gy
Plan Total Fractions Prescribed: 12
Plan Total Fractions Prescribed: 25
Plan Total Prescribed Dose: 24 Gy
Plan Total Prescribed Dose: 50 Gy
Reference Point Dosage Given to Date: 12 Gy
Reference Point Dosage Given to Date: 24 Gy
Reference Point Session Dosage Given: 2 Gy
Reference Point Session Dosage Given: 2 Gy
Session Number: 12

## 2023-06-24 ENCOUNTER — Other Ambulatory Visit: Payer: Self-pay

## 2023-06-24 ENCOUNTER — Inpatient Hospital Stay (HOSPITAL_BASED_OUTPATIENT_CLINIC_OR_DEPARTMENT_OTHER): Payer: BC Managed Care – PPO | Admitting: Adult Health

## 2023-06-24 ENCOUNTER — Ambulatory Visit
Admission: RE | Admit: 2023-06-24 | Discharge: 2023-06-24 | Disposition: A | Discharge status: Home / Self Care | Payer: BC Managed Care – PPO | Source: Ambulatory Visit | Attending: Radiation Oncology | Admitting: Radiation Oncology

## 2023-06-24 DIAGNOSIS — C50411 Malignant neoplasm of upper-outer quadrant of right female breast: Secondary | ICD-10-CM

## 2023-06-24 DIAGNOSIS — Z170 Estrogen receptor positive status [ER+]: Secondary | ICD-10-CM

## 2023-06-24 DIAGNOSIS — Z510 Encounter for antineoplastic radiation therapy: Secondary | ICD-10-CM | POA: Diagnosis not present

## 2023-06-24 LAB — RAD ONC ARIA SESSION SUMMARY
Course Elapsed Days: 16
Plan Fractions Treated to Date: 13
Plan Fractions Treated to Date: 7
Plan Prescribed Dose Per Fraction: 2 Gy
Plan Prescribed Dose Per Fraction: 2 Gy
Plan Total Fractions Prescribed: 13
Plan Total Fractions Prescribed: 25
Plan Total Prescribed Dose: 26 Gy
Plan Total Prescribed Dose: 50 Gy
Reference Point Dosage Given to Date: 14 Gy
Reference Point Dosage Given to Date: 26 Gy
Reference Point Session Dosage Given: 2 Gy
Reference Point Session Dosage Given: 2 Gy
Session Number: 13

## 2023-06-24 NOTE — Progress Notes (Signed)
[facility] Cancer Follow up:    Caitlin Chimera, MD [address] [place] [address]   DIAGNOSIS:  Cancer Staging  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female, estrogen receptor positive (HCC) Staging form: Breast, AJCC 8th Edition - Clinical: Stage IB (cT1c, cN0, cM0, G3, ER+, PR-, HER2-) - Signed by [name], MD on 09/25/2022 Histologic grading system: 3 grade system - Pathologic: Stage IIIC (pT1c, pN2a, cM0, G3, ER-, PR-, HER2-) - Signed by [name], MD on 10/21/2022 Stage prefix: Initial diagnosis Histologic grading system: 3 grade system  I connected with Caitlin [name] on 06/24/23 at  9:15 AM EDT by telephone and verified that I am speaking with the correct person using two identifiers.  I discussed the limitations, risks, security and privacy concerns of performing an evaluation and management service by telephone and the availability of in person appointments.  I also discussed with the patient that there may be a patient responsible charge related to this service. The patient expressed understanding and agreed to proceed.  Patient location: home Provider location: [facility] office SUMMARY OF ONCOLOGIC HISTORY: Oncology History  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female, estrogen receptor positive (HCC)  09/15/2022 Initial Diagnosis   Screening mammogram detected right breast mass which measured 1.4 cm by ultrasound at 10 o'clock position, biopsy revealed grade 3 IDC with lymphovascular invasion ER 10%, PR 0%, Ki-67 30%, HER2 0 negative   09/25/2022 Cancer Staging   Staging form: Breast, AJCC 8th Edition - Clinical: Stage IB (cT1c, cN0, cM0, G3, ER+, PR-, HER2-) - Signed by [name], MD on 09/25/2022 Histologic grading system: 3 grade system    Genetic Testing   [facility] CancerNext-Expanded Panel+RNA was Negative. Report date is 10/06/2021.  The CancerNext-Expanded gene panel offered by [facility]  and includes sequencing, rearrangement, and RNA analysis for the following 77 genes: AIP, ALK, APC, ATM, AXIN2, BAP1, BARD1, BLM, BMPR1A, BRCA1, BRCA2, BRIP1, CDC73, CDH1, CDK4, CDKN1B, CDKN2A, CHEK2, CTNNA1, DICER1, FANCC, FH, FLCN, GALNT12, KIF1B, LZTR1, MAX, MEN1, MET, MLH1, MSH2, MSH3, MSH6, MUTYH, NBN, NF1, NF2, NTHL1, PALB2, PHOX2B, PMS2, POT1, PRKAR1A, PTCH1, PTEN, RAD51C, RAD51D, RB1, RECQL, RET, SDHA, SDHAF2, SDHB, SDHC, SDHD, SMAD4, SMARCA4, SMARCB1, SMARCE1, STK11, SUFU, TMEM127, TP53, TSC1, TSC2, VHL and XRCC2 (sequencing and deletion/duplication); EGFR, EGLN1, HOXB13, KIT, MITF, PDGFRA, POLD1, and POLE (sequencing only); EPCAM and GREM1 (deletion/duplication only).    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 with high-grade DCIS, inferior margin positive, angiolymphatic invasion present, 5/5 lymph nodes positive, additional medial margin: Poorly differentiated carcinoma with lymphoid stroma, additional inferior margin: Positive, ER 10% weak, PR 0%, HER2 0, Ki-67 30%   10/21/2022 Cancer Staging   Staging form: Breast, AJCC 8th Edition - Pathologic: Stage IIIC (pT1c, pN2a, cM0, G3, ER-, PR-, HER2-) - Signed by [name], MD on 10/21/2022 Stage prefix: Initial diagnosis Histologic grading system: 3 grade system   11/08/2022 Surgery   Margin reexcision: Inferior margin: Grade 3 IDC, new margin positive Medial margin: Grade 3 IDC new margin widely involved   11/18/2022 Surgery   Right inferior margin reexcision: Invasive poorly differentiated adenocarcinoma grade 3 present in new inferior margin.  Medial margin reexcision: Invasive poorly differentiated adenocarcinoma grade 3 present at new medial margin, posterior margin: Poorly differentiated adenocarcinoma grade 3, margin negative  11/08/2022 and 11/18/2022: Final margin focal positivity. After much discussion the plan is to finish  her systemic  treatment and come back at a later point to do mastectomy.    12/02/2022 - 04/18/2023 Chemotherapy   Patient is on Treatment Plan : BREAST ADJUVANT DOSE DENSE AC q14d / PACLitaxel q7d     05/03/2023 Surgery   Right mastectomy: 8 cm grade 3 IDC with extensive lymphatic and vascular invasion RCB class III, deep margin multifocally positive, 5/5 lymph nodes positive, ER weak 10%, PR 0%, HER2 0, Ki-67 30%   05/25/2023 -  Chemotherapy   Patient is on Treatment Plan : BREAST Pembrolizumab (200) q21d x 24 months       CURRENT THERAPY: Keytruda  INTERVAL HISTORY: Caitlin [name] 67 y.o. female returns for f/u of her rash.  Her most recent Keytruda dose was on May 25, 2023.  Following this she did develop a rash.She was put on a prolonged steroid taper and the rash has resolved since this prolonged taper.    She was diagnosed with COVID-19 8 days ago. She is feeling better since her diagnosis.    She continues on Capecitabine BID Monday through Friday and is tolerating it well.  She denies any issues with this and is feeling well.     Patient Active Problem List   Diagnosis Date Noted   Breast CA (HCC) 05/03/2023   S/P mastectomy, right 05/03/2023   Port-A-Cath in place 12/16/2022   Genetic testing 10/11/2022   Appendicitis with peritonitis 09/29/2022   Family history of prostate cancer 09/29/2022   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female, estrogen receptor positive (HCC) 09/22/2022   Atrial fibrillation with RVR (HCC) 11/30/2010   Chronic anticoagulation 11/30/2010   Hypothyroidism 11/30/2010   PALPITATIONS 11/27/2010    is allergic to penicillins.  MEDICAL HISTORY: Past Medical History:  Diagnosis Date   Asthma    Complication of anesthesia    Dyspnea    Dysrhythmia    2012   Headache    History of kidney stones    passed   Hypothyroidism    Pneumonia 01/26/2023   PONV (postoperative nausea and vomiting)    rt breast ca 08/2022   Right  Breast   Scoliosis    Thyroid disease     SURGICAL HISTORY: Past Surgical History:  Procedure Laterality Date   APPENDECTOMY     AXILLARY SENTINEL NODE BIOPSY Right 10/14/2022   Procedure: RIGHT AXILLARY SENTINEL NODE BIOPSY;  Surgeon: [name], MD;  Location: [facility] OR;  Service: General;  Laterality: Right;   BREAST BIOPSY Right 09/15/2022   [place] RT BREAST BX W LOC DEV 1ST LESION IMG BX SPEC US GUIDE 09/15/2022 [facility] MAMMOGRAPHY   BREAST BIOPSY  10/12/2022   [place] RT RADIOACTIVE SEED LOC 10/12/2022 [facility] MAMMOGRAPHY   BREAST LUMPECTOMY WITH RADIOACTIVE SEED AND SENTINEL LYMPH NODE BIOPSY Right 10/14/2022   Procedure: RIGHT BREAST LUMPECTOMY WITH RADIOACTIVE SEED;  Surgeon: [name], MD;  Location: [facility] OR;  Service: General;  Laterality: Right;   COLONOSCOPY     LAPAROSCOPIC APPENDECTOMY     PORT-A-CATH REMOVAL Left 05/03/2023   Procedure: REMOVAL PORT-A-CATH;  Surgeon: [name], MD;  Location: [facility] OR;  Service: General;  Laterality: Left;   PORTACATH PLACEMENT Left 10/14/2022   Procedure: INSERTION PORT-A-CATH;  Surgeon: [name], MD;  Location: [facility] OR;  Service: General;  Laterality: Left;   PORTACATH PLACEMENT Left 05/19/2023   Procedure: PORT PLACEMENT WITH ULTRASOUND GUIDANCE;  Surgeon: [name], MD;  Location: [facility];  Service: General;  Laterality: Left;  RE-EXCISION OF BREAST LUMPECTOMY Right 11/08/2022   Procedure: RE-EXCISION OF RIGHT BREAST LUMPECTOMY;  Surgeon: [name], MD;  Location: [facility];  Service: General;  Laterality: Right;   RE-EXCISION OF BREAST LUMPECTOMY Right 11/18/2022   Procedure: RE-EXCISION OF RIGHT BREAST LUMPECTOMY;  Surgeon: [name], MD;  Location: [facility] OR;  Service: General;  Laterality: Right;   SIMPLE MASTECTOMY WITH AXILLARY SENTINEL NODE BIOPSY Right 05/03/2023   Procedure: RIGHT MASTECTOMY;  Surgeon: [name], MD;  Location: [facility] OR;  Service:  General;  Laterality: Right;  GEN & PEC BLOCK   WISDOM TOOTH EXTRACTION      SOCIAL HISTORY: Social History   Socioeconomic History   Marital status: Married    Spouse name: Not on file   Number of children: Not on file   Years of education: Not on file   Highest education level: Not on file  Occupational History   Not on file  Tobacco Use   Smoking status: Never   Smokeless tobacco: Not on file  Vaping Use   Vaping status: Never Used  Substance and Sexual Activity   Alcohol use: Not Currently   Drug use: Never   Sexual activity: Not Currently  Other Topics Concern   Not on file  Social History Narrative   Not on file   Social Determinants of Health   Financial Resource Strain: Not on file  Food Insecurity: No Food Insecurity (05/27/2023)   Hunger Vital Sign    Worried About Running Out of Food in the Last Year: Never true    Ran Out of Food in the Last Year: Never true  Transportation Needs: No Transportation Needs (05/27/2023)   PRAPARE - Administrator, Civil Service (Medical): No    Lack of Transportation (Non-Medical): No  Physical Activity: Sufficiently Active (06/19/2020)   Received [facility]   Exercise Vital Sign    Days of Exercise per Week: 7 days    Minutes of Exercise per Session: 30 min  Stress: Not on file  Social Connections: Not on file  Intimate Partner Violence: Not At Risk (05/27/2023)   Humiliation, Afraid, Rape, and Kick questionnaire    Fear of Current or Ex-Partner: No    Emotionally Abused: No    Physically Abused: No    Sexually Abused: No    FAMILY HISTORY: Family History  Problem Relation Age of Onset   Thyroid disease Mother    Prostate cancer Father 6       metastatic   Hypertension Father    Thyroid disease Sister        twin sister age 84   Asthma Sister    Prostate cancer Brother 67   Heart attack Maternal Grandmother        died age 83's   Heart attack Maternal Grandfather         died age 71's   Clotting disorder Paternal Grandmother        cerebral hemorrage    Review of Systems  Constitutional:  Negative for appetite change, chills, fatigue, fever and unexpected weight change.  HENT:   Negative for hearing loss, lump/mass and trouble swallowing.   Eyes:  Negative for eye problems and icterus.  Respiratory:  Negative for chest tightness, cough and shortness of breath.   Cardiovascular:  Negative for chest pain, leg swelling and palpitations.  Gastrointestinal:  Negative for abdominal distention, abdominal pain, constipation, diarrhea, nausea and vomiting.  Endocrine: Negative for hot  flashes.  Genitourinary:  Negative for difficulty urinating.   Musculoskeletal:  Negative for arthralgias.  Skin:  Negative for itching and rash.  Neurological:  Negative for dizziness, extremity weakness, headaches and numbness.  Hematological:  Negative for adenopathy. Does not bruise/bleed easily.  Psychiatric/Behavioral:  Negative for depression. The patient is not nervous/anxious.       PHYSICAL EXAMINATION Patient sounds well.  She is in no apparent distress.  Mood and behavior are normal.  Breathing is nonlabored.  LABORATORY DATA:  CBC    Component Value Date/Time   WBC 6.9 06/15/2023 0819   WBC 11.1 (H) 10/13/2022 0930   RBC 3.78 (L) 06/15/2023 0819   HGB 11.6 (L) 06/15/2023 0819   HCT 34.8 (L) 06/15/2023 0819   PLT 409 (H) 06/15/2023 0819   MCV 92.1 06/15/2023 0819   MCH 30.7 06/15/2023 0819   MCHC 33.3 06/15/2023 0819   RDW 13.4 06/15/2023 0819   LYMPHSABS 0.7 06/15/2023 0819   MONOABS 0.4 06/15/2023 0819   EOSABS 0.1 06/15/2023 0819   BASOSABS 0.0 06/15/2023 0819    CMP     Component Value Date/Time   NA 138 06/15/2023 0819   K 4.0 06/15/2023 0819   CL 106 06/15/2023 0819   CO2 25 06/15/2023 0819   GLUCOSE 101 (H) 06/15/2023 0819   BUN 21 06/15/2023 0819   CREATININE 0.72 06/15/2023 0819   CALCIUM 9.3 06/15/2023 0819   PROT 7.1 06/15/2023 0819    ALBUMIN 4.4 06/15/2023 0819   AST 39 06/15/2023 0819   ALT 79 (H) 06/15/2023 0819   ALKPHOS 113 06/15/2023 0819   BILITOT 0.5 06/15/2023 0819   GFRNONAA >60 06/15/2023 0819      ASSESSMENT and THERAPY PLAN:   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female, estrogen receptor positive (HCC) 10/14/2022:Right lumpectomy: 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  No distant metastatic disease. Bone scan 10/29/2022: No evidence of bone metastasis   11/08/2022 and 11/18/2022: Margin reexcision surgeries: Final margin focal positivity.  After much discussion the plan is to finish her systemic treatment and come back at a later point to do mastectomy.   Treatment summary: Adjuvant chemotherapy with dose dense Adriamycin and Cytoxan followed by Taxol to be completed 05/02/2023 Mastectomy by Dr. Dwain Sarna 05/03/2023:Right mastectomy: 8 cm grade 3 IDC with extensive lymphatic and vascular invasion RCB class III, deep margin multifocally positive, 5/5 lymph nodes positive, ER weak 10%, PR 0%, HER2 0, Ki-67 30%, total lymph nodes: 10/10 (prior lumpectomy she had 5 positive nodes) Adjuvant radiation to start 06/09/2023 ------------------------------------------------------------------------------------------------------------------------------------------------ CT CAP 05/22/2023: Similar to slightly decreased size of right axillary and subpectoral lymph nodes few prominent  supraclavicular nodes increased or new (nonspecific versus metastatic), liver hypodensity indeterminate sclerosis left ninth rib fracture Bone scan 05/22/2023: Left ninth rib fracture otherwise negative Ordered MRI of the liver for assessment of the liver findings.   Current treatment: Pembrolizumab maintenance every 3 weeks started 05/25/2023 with capecitabine Toxicities: Skin rash: Rande Lawman is currently being held.  Resolved after prolonged steroid taper.  Karia is unsure she  wants to receive further [place].  She will meet with Dr. Pamelia Hoit on 10/2 for labs and further discussion  Capecitabine and radiation: tolerating well.  She has no side effects she has noticed.    RTC on 10/2 for labs, f/u with Dr. Pamelia Hoit, and possibly [place].   The patient was provided an opportunity to ask questions and all were answered. The patient agreed with the plan and demonstrated an understanding of the instructions.   The patient was advised to call back or seek an in-person evaluation if the symptoms worsen or if the condition fails to improve as anticipated.   I provided 8 minutes of non face-to-face telephone visit time during this encounter, and > 50% was spent counseling as documented under my assessment & plan.  Lillard Anes, NP 06/24/23 9:24 AM Medical Oncology and Hematology [facility] [address] [place], [place] [address] Tel. [phone]    Fax. [phone]

## 2023-06-24 NOTE — Assessment & Plan Note (Signed)
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  No distant metastatic disease. Bone scan 10/29/2022: No evidence of bone metastasis   11/08/2022 and 11/18/2022: Margin reexcision surgeries: Final margin focal positivity.  After much discussion the plan is to finish her systemic treatment and come back at a later point to do mastectomy.   Treatment summary: Adjuvant chemotherapy with dose dense Adriamycin and Cytoxan followed by Taxol to be completed 05/02/2023 Mastectomy by Dr. Dwain Sarna 05/03/2023:Right mastectomy: 8 cm grade 3 IDC with extensive lymphatic and vascular invasion RCB class III, deep margin multifocally positive, 5/5 lymph nodes positive, ER weak 10%, PR 0%, HER2 0, Ki-67 30%, total lymph nodes: 10/10 (prior lumpectomy she had 5 positive nodes) Adjuvant radiation to start 06/09/2023 ------------------------------------------------------------------------------------------------------------------------------------------------ CT CAP 05/22/2023: Similar to slightly decreased size of right axillary and subpectoral lymph nodes few prominent supraclavicular nodes increased or new (nonspecific versus metastatic), liver hypodensity indeterminate sclerosis left ninth rib fracture Bone scan 05/22/2023: Left ninth rib fracture otherwise negative Ordered MRI of the liver for assessment of the liver findings.   Current treatment: Pembrolizumab maintenance every 3 weeks  started 05/25/2023 with capecitabine Toxicities: Skin rash: Rande Lawman is currently being held.  Resolved after prolonged steroid taper.  Rossella is unsure she wants to receive further [place].  She will meet with Dr. Pamelia Hoit on 10/2 for labs and further discussion  Capecitabine and radiation: tolerating well.  She has no side effects she has noticed.    RTC on 10/2 for labs, f/u with Dr. Pamelia Hoit, and possibly [place].

## 2023-06-25 ENCOUNTER — Ambulatory Visit: Payer: BC Managed Care – PPO

## 2023-06-26 ENCOUNTER — Ambulatory Visit: Payer: BC Managed Care – PPO

## 2023-06-27 ENCOUNTER — Other Ambulatory Visit: Payer: Self-pay

## 2023-06-27 ENCOUNTER — Ambulatory Visit
Admission: RE | Admit: 2023-06-27 | Discharge: 2023-06-27 | Disposition: A | Discharge status: Home / Self Care | Payer: BC Managed Care – PPO | Source: Ambulatory Visit | Attending: Radiation Oncology | Admitting: Radiation Oncology

## 2023-06-27 DIAGNOSIS — Z510 Encounter for antineoplastic radiation therapy: Secondary | ICD-10-CM | POA: Diagnosis not present

## 2023-06-27 LAB — RAD ONC ARIA SESSION SUMMARY
Course Elapsed Days: 19
Plan Fractions Treated to Date: 14
Plan Fractions Treated to Date: 7
Plan Prescribed Dose Per Fraction: 2 Gy
Plan Prescribed Dose Per Fraction: 2 Gy
Plan Total Fractions Prescribed: 12
Plan Total Fractions Prescribed: 25
Plan Total Prescribed Dose: 24 Gy
Plan Total Prescribed Dose: 50 Gy
Reference Point Dosage Given to Date: 14 Gy
Reference Point Dosage Given to Date: 28 Gy
Reference Point Session Dosage Given: 2 Gy
Reference Point Session Dosage Given: 2 Gy
Session Number: 14

## 2023-06-28 ENCOUNTER — Other Ambulatory Visit: Payer: Self-pay

## 2023-06-28 ENCOUNTER — Ambulatory Visit: Payer: BC Managed Care – PPO | Admitting: Physical Therapy

## 2023-06-28 ENCOUNTER — Ambulatory Visit
Admission: RE | Admit: 2023-06-28 | Discharge: 2023-06-28 | Disposition: A | Discharge status: Home / Self Care | Payer: BC Managed Care – PPO | Source: Ambulatory Visit | Attending: Radiation Oncology | Admitting: Radiation Oncology

## 2023-06-28 DIAGNOSIS — Z510 Encounter for antineoplastic radiation therapy: Secondary | ICD-10-CM | POA: Diagnosis not present

## 2023-06-28 LAB — RAD ONC ARIA SESSION SUMMARY
Course Elapsed Days: 20
Plan Fractions Treated to Date: 15
Plan Fractions Treated to Date: 8
Plan Prescribed Dose Per Fraction: 2 Gy
Plan Prescribed Dose Per Fraction: 2 Gy
Plan Total Fractions Prescribed: 13
Plan Total Fractions Prescribed: 25
Plan Total Prescribed Dose: 26 Gy
Plan Total Prescribed Dose: 50 Gy
Reference Point Dosage Given to Date: 16 Gy
Reference Point Dosage Given to Date: 30 Gy
Reference Point Session Dosage Given: 2 Gy
Reference Point Session Dosage Given: 2 Gy
Session Number: 15

## 2023-06-29 ENCOUNTER — Ambulatory Visit
Admission: RE | Admit: 2023-06-29 | Discharge: 2023-06-29 | Disposition: A | Discharge status: Home / Self Care | Payer: BC Managed Care – PPO | Source: Ambulatory Visit | Attending: Radiation Oncology | Admitting: Radiation Oncology

## 2023-06-29 ENCOUNTER — Encounter: Payer: Self-pay | Admitting: Hematology and Oncology

## 2023-06-29 ENCOUNTER — Other Ambulatory Visit: Payer: Self-pay

## 2023-06-29 DIAGNOSIS — Z510 Encounter for antineoplastic radiation therapy: Secondary | ICD-10-CM | POA: Diagnosis not present

## 2023-06-29 LAB — RAD ONC ARIA SESSION SUMMARY
Course Elapsed Days: 21
Plan Fractions Treated to Date: 16
Plan Fractions Treated to Date: 8
Plan Prescribed Dose Per Fraction: 2 Gy
Plan Prescribed Dose Per Fraction: 2 Gy
Plan Total Fractions Prescribed: 12
Plan Total Fractions Prescribed: 25
Plan Total Prescribed Dose: 24 Gy
Plan Total Prescribed Dose: 50 Gy
Reference Point Dosage Given to Date: 16 Gy
Reference Point Dosage Given to Date: 32 Gy
Reference Point Session Dosage Given: 2 Gy
Reference Point Session Dosage Given: 2 Gy
Session Number: 16

## 2023-06-30 ENCOUNTER — Encounter: Payer: BC Managed Care – PPO | Admitting: Physical Therapy

## 2023-06-30 ENCOUNTER — Other Ambulatory Visit: Payer: Self-pay

## 2023-06-30 ENCOUNTER — Ambulatory Visit
Admission: RE | Admit: 2023-06-30 | Discharge: 2023-06-30 | Disposition: A | Discharge status: Home / Self Care | Payer: BC Managed Care – PPO | Source: Ambulatory Visit | Attending: Radiation Oncology | Admitting: Radiation Oncology

## 2023-06-30 ENCOUNTER — Other Ambulatory Visit: Payer: Self-pay | Admitting: *Deleted

## 2023-06-30 DIAGNOSIS — C50411 Malignant neoplasm of upper-outer quadrant of right female breast: Secondary | ICD-10-CM

## 2023-06-30 DIAGNOSIS — Z510 Encounter for antineoplastic radiation therapy: Secondary | ICD-10-CM | POA: Diagnosis not present

## 2023-06-30 DIAGNOSIS — R21 Rash and other nonspecific skin eruption: Secondary | ICD-10-CM

## 2023-06-30 LAB — RAD ONC ARIA SESSION SUMMARY
Course Elapsed Days: 22
Plan Fractions Treated to Date: 17
Plan Fractions Treated to Date: 9
Plan Prescribed Dose Per Fraction: 2 Gy
Plan Prescribed Dose Per Fraction: 2 Gy
Plan Total Fractions Prescribed: 13
Plan Total Fractions Prescribed: 25
Plan Total Prescribed Dose: 26 Gy
Plan Total Prescribed Dose: 50 Gy
Reference Point Dosage Given to Date: 18 Gy
Reference Point Dosage Given to Date: 34 Gy
Reference Point Session Dosage Given: 2 Gy
Reference Point Session Dosage Given: 2 Gy
Session Number: 17

## 2023-06-30 MED ORDER — METHYLPREDNISOLONE 4 MG PO TBPK
ORAL_TABLET | ORAL | 0 refills | Status: AC
Start: 2023-06-30 — End: ?

## 2023-07-01 ENCOUNTER — Other Ambulatory Visit: Payer: Self-pay

## 2023-07-01 ENCOUNTER — Ambulatory Visit
Admission: RE | Admit: 2023-07-01 | Discharge: 2023-07-01 | Disposition: A | Discharge status: Home / Self Care | Payer: BC Managed Care – PPO | Source: Ambulatory Visit | Attending: Radiation Oncology | Admitting: Radiation Oncology

## 2023-07-01 DIAGNOSIS — Z510 Encounter for antineoplastic radiation therapy: Secondary | ICD-10-CM | POA: Diagnosis not present

## 2023-07-01 LAB — RAD ONC ARIA SESSION SUMMARY
Course Elapsed Days: 23
Plan Fractions Treated to Date: 18
Plan Fractions Treated to Date: 9
Plan Prescribed Dose Per Fraction: 2 Gy
Plan Prescribed Dose Per Fraction: 2 Gy
Plan Total Fractions Prescribed: 12
Plan Total Fractions Prescribed: 25
Plan Total Prescribed Dose: 24 Gy
Plan Total Prescribed Dose: 50 Gy
Reference Point Dosage Given to Date: 18 Gy
Reference Point Dosage Given to Date: 36 Gy
Reference Point Session Dosage Given: 2 Gy
Reference Point Session Dosage Given: 2 Gy
Session Number: 18

## 2023-07-02 ENCOUNTER — Ambulatory Visit: Payer: BC Managed Care – PPO

## 2023-07-03 ENCOUNTER — Ambulatory Visit: Payer: BC Managed Care – PPO

## 2023-07-04 ENCOUNTER — Other Ambulatory Visit: Payer: Self-pay

## 2023-07-04 ENCOUNTER — Ambulatory Visit
Admission: RE | Admit: 2023-07-04 | Discharge: 2023-07-04 | Disposition: A | Discharge status: Home / Self Care | Payer: BC Managed Care – PPO | Source: Ambulatory Visit | Attending: Radiation Oncology | Admitting: Radiation Oncology

## 2023-07-04 DIAGNOSIS — Z510 Encounter for antineoplastic radiation therapy: Secondary | ICD-10-CM | POA: Diagnosis not present

## 2023-07-04 LAB — RAD ONC ARIA SESSION SUMMARY
Course Elapsed Days: 26
Plan Fractions Treated to Date: 10
Plan Fractions Treated to Date: 19
Plan Prescribed Dose Per Fraction: 2 Gy
Plan Prescribed Dose Per Fraction: 2 Gy
Plan Total Fractions Prescribed: 13
Plan Total Fractions Prescribed: 25
Plan Total Prescribed Dose: 26 Gy
Plan Total Prescribed Dose: 50 Gy
Reference Point Dosage Given to Date: 20 Gy
Reference Point Dosage Given to Date: 38 Gy
Reference Point Session Dosage Given: 2 Gy
Reference Point Session Dosage Given: 2 Gy
Session Number: 19

## 2023-07-05 ENCOUNTER — Other Ambulatory Visit: Payer: Self-pay

## 2023-07-05 ENCOUNTER — Ambulatory Visit
Admission: RE | Admit: 2023-07-05 | Discharge: 2023-07-05 | Disposition: A | Discharge status: Home / Self Care | Payer: BC Managed Care – PPO | Source: Ambulatory Visit | Attending: Radiation Oncology | Admitting: Radiation Oncology

## 2023-07-05 ENCOUNTER — Encounter: Payer: BC Managed Care – PPO | Admitting: Rehabilitation

## 2023-07-05 DIAGNOSIS — Z170 Estrogen receptor positive status [ER+]: Secondary | ICD-10-CM | POA: Insufficient documentation

## 2023-07-05 DIAGNOSIS — Z5112 Encounter for antineoplastic immunotherapy: Secondary | ICD-10-CM | POA: Diagnosis present

## 2023-07-05 DIAGNOSIS — Z510 Encounter for antineoplastic radiation therapy: Secondary | ICD-10-CM | POA: Insufficient documentation

## 2023-07-05 DIAGNOSIS — Z79620 Long term (current) use of immunosuppressive biologic: Secondary | ICD-10-CM | POA: Insufficient documentation

## 2023-07-05 DIAGNOSIS — C50411 Malignant neoplasm of upper-outer quadrant of right female breast: Secondary | ICD-10-CM | POA: Insufficient documentation

## 2023-07-05 LAB — RAD ONC ARIA SESSION SUMMARY
Course Elapsed Days: 27
Plan Fractions Treated to Date: 10
Plan Fractions Treated to Date: 20
Plan Prescribed Dose Per Fraction: 2 Gy
Plan Prescribed Dose Per Fraction: 2 Gy
Plan Total Fractions Prescribed: 12
Plan Total Fractions Prescribed: 25
Plan Total Prescribed Dose: 24 Gy
Plan Total Prescribed Dose: 50 Gy
Reference Point Dosage Given to Date: 20 Gy
Reference Point Dosage Given to Date: 40 Gy
Reference Point Session Dosage Given: 2 Gy
Reference Point Session Dosage Given: 2 Gy
Session Number: 20

## 2023-07-06 ENCOUNTER — Inpatient Hospital Stay: Payer: BC Managed Care – PPO | Attending: Hematology and Oncology

## 2023-07-06 ENCOUNTER — Inpatient Hospital Stay (HOSPITAL_BASED_OUTPATIENT_CLINIC_OR_DEPARTMENT_OTHER): Payer: BC Managed Care – PPO | Admitting: Hematology and Oncology

## 2023-07-06 ENCOUNTER — Telehealth: Payer: Self-pay | Admitting: Pharmacy Technician

## 2023-07-06 ENCOUNTER — Other Ambulatory Visit: Payer: Self-pay

## 2023-07-06 ENCOUNTER — Ambulatory Visit
Admission: RE | Admit: 2023-07-06 | Discharge: 2023-07-06 | Disposition: A | Discharge status: Home / Self Care | Payer: BC Managed Care – PPO | Source: Ambulatory Visit | Attending: Radiation Oncology | Admitting: Radiation Oncology

## 2023-07-06 ENCOUNTER — Inpatient Hospital Stay: Payer: BC Managed Care – PPO

## 2023-07-06 ENCOUNTER — Other Ambulatory Visit (HOSPITAL_COMMUNITY): Payer: Self-pay

## 2023-07-06 VITALS — BP 127/70 | HR 75 | Temp 97.5°F | Resp 18 | Ht 63.0 in | Wt 144.1 lb

## 2023-07-06 DIAGNOSIS — R21 Rash and other nonspecific skin eruption: Secondary | ICD-10-CM

## 2023-07-06 DIAGNOSIS — C50411 Malignant neoplasm of upper-outer quadrant of right female breast: Secondary | ICD-10-CM | POA: Diagnosis not present

## 2023-07-06 DIAGNOSIS — Z95828 Presence of other vascular implants and grafts: Secondary | ICD-10-CM

## 2023-07-06 DIAGNOSIS — Z170 Estrogen receptor positive status [ER+]: Secondary | ICD-10-CM | POA: Insufficient documentation

## 2023-07-06 DIAGNOSIS — Z79620 Long term (current) use of immunosuppressive biologic: Secondary | ICD-10-CM | POA: Insufficient documentation

## 2023-07-06 DIAGNOSIS — Z5112 Encounter for antineoplastic immunotherapy: Secondary | ICD-10-CM | POA: Insufficient documentation

## 2023-07-06 LAB — CMP (CANCER CENTER ONLY)
ALT: 16 U/L (ref 0–44)
AST: 11 U/L — ABNORMAL LOW (ref 15–41)
Albumin: 4.2 g/dL (ref 3.5–5.0)
Alkaline Phosphatase: 81 U/L (ref 38–126)
Anion gap: 7 (ref 5–15)
BUN: 19 mg/dL (ref 8–23)
CO2: 27 mmol/L (ref 22–32)
Calcium: 9.4 mg/dL (ref 8.9–10.3)
Chloride: 105 mmol/L (ref 98–111)
Creatinine: 0.79 mg/dL (ref 0.44–1.00)
GFR, Estimated: >60 mL/min (ref >60)
Glucose, Bld: 93 mg/dL (ref 70–99)
Potassium: 3.9 mmol/L (ref 3.5–5.1)
Sodium: 139 mmol/L (ref 135–145)
Total Bilirubin: 0.4 mg/dL (ref 0.3–1.2)
Total Protein: 6.8 g/dL (ref 6.5–8.1)

## 2023-07-06 LAB — CBC WITH DIFFERENTIAL (CANCER CENTER ONLY)
Abs Immature Granulocytes: 0.06 10*3/uL (ref 0.00–0.07)
Basophils Absolute: 0 10*3/uL (ref 0.0–0.1)
Basophils Relative: 0 %
Eosinophils Absolute: 0 10*3/uL (ref 0.0–0.5)
Eosinophils Relative: 1 %
HCT: 35.9 % — ABNORMAL LOW (ref 36.0–46.0)
Hemoglobin: 11.8 g/dL — ABNORMAL LOW (ref 12.0–15.0)
Immature Granulocytes: 1 %
Lymphocytes Relative: 4 %
Lymphs Abs: 0.3 10*3/uL — ABNORMAL LOW (ref 0.7–4.0)
MCH: 31.1 pg (ref 26.0–34.0)
MCHC: 32.9 g/dL (ref 30.0–36.0)
MCV: 94.5 fL (ref 80.0–100.0)
Monocytes Absolute: 0.6 10*3/uL (ref 0.1–1.0)
Monocytes Relative: 7 %
Neutro Abs: 6.9 10*3/uL (ref 1.7–7.7)
Neutrophils Relative %: 87 %
Platelet Count: 285 10*3/uL (ref 150–400)
RBC: 3.8 MIL/uL — ABNORMAL LOW (ref 3.87–5.11)
RDW: 16.2 % — ABNORMAL HIGH (ref 11.5–15.5)
WBC Count: 7.9 10*3/uL (ref 4.0–10.5)
nRBC: 0 % (ref 0.0–0.2)

## 2023-07-06 LAB — RAD ONC ARIA SESSION SUMMARY
Course Elapsed Days: 28
Plan Fractions Treated to Date: 11
Plan Fractions Treated to Date: 21
Plan Prescribed Dose Per Fraction: 2 Gy
Plan Prescribed Dose Per Fraction: 2 Gy
Plan Total Fractions Prescribed: 13
Plan Total Fractions Prescribed: 25
Plan Total Prescribed Dose: 26 Gy
Plan Total Prescribed Dose: 50 Gy
Reference Point Dosage Given to Date: 22 Gy
Reference Point Dosage Given to Date: 42 Gy
Reference Point Session Dosage Given: 2 Gy
Reference Point Session Dosage Given: 2 Gy
Session Number: 21

## 2023-07-06 LAB — TSH: TSH: 1.253 u[IU]/mL (ref 0.350–4.500)

## 2023-07-06 MED ORDER — HEPARIN SOD (PORK) LOCK FLUSH 100 UNIT/ML IV SOLN
500.0000 [IU] | Freq: Once | INTRAVENOUS | Status: AC | PRN
  Administered 2023-07-06: 500 [IU]

## 2023-07-06 MED ORDER — METHYLPREDNISOLONE 4 MG PO TBPK
ORAL_TABLET | ORAL | 0 refills | Status: DC
Start: 2023-07-06 — End: 2023-07-27

## 2023-07-06 MED ORDER — SODIUM CHLORIDE 0.9% FLUSH
10.0000 mL | INTRAVENOUS | Status: DC | PRN
  Administered 2023-07-06: 10 mL

## 2023-07-06 MED ORDER — SODIUM CHLORIDE 0.9 % IV SOLN
200.0000 mg | Freq: Once | INTRAVENOUS | Status: AC
  Administered 2023-07-06: 200 mg via INTRAVENOUS
  Filled 2023-07-06: qty 200

## 2023-07-06 MED ORDER — SODIUM CHLORIDE 0.9 % IV SOLN: Freq: Once | INTRAVENOUS | Status: AC

## 2023-07-06 MED ORDER — SODIUM CHLORIDE 0.9% FLUSH
10.0000 mL | Freq: Once | INTRAVENOUS | Status: AC
  Administered 2023-07-06: 10 mL

## 2023-07-06 MED ORDER — CAPECITABINE 500 MG PO TABS
625.0000 mg/m2 | ORAL_TABLET | Freq: Two times a day (BID) | ORAL | 5 refills | Status: DC
  Filled 2023-07-18: qty 56, 21d supply, fill #0
  Filled 2023-08-01: qty 56, 21d supply, fill #1
  Filled 2023-08-25: qty 56, 21d supply, fill #2
  Filled 2023-09-08: qty 56, 21d supply, fill #3
  Filled 2023-10-06: qty 56, 21d supply, fill #4
  Filled 2023-10-25: qty 56, 21d supply, fill #5

## 2023-07-06 NOTE — Assessment & Plan Note (Addendum)
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  No distant metastatic disease. Bone scan 10/29/2022: No evidence of bone metastasis   11/08/2022 and 11/18/2022: Margin reexcision surgeries: Final margin focal positivity.  After much discussion the plan is to finish her systemic treatment and come back at a later point to do mastectomy.   Treatment summary: Adjuvant chemotherapy with dose dense Adriamycin and Cytoxan followed by Taxol to be completed 05/02/2023 Mastectomy by Dr. Dwain Sarna 05/03/2023:Right mastectomy: 8 cm grade 3 IDC with extensive lymphatic and vascular invasion RCB class III, deep margin multifocally positive, 5/5 lymph nodes positive, ER weak 10%, PR 0%, HER2 0, Ki-67 30%, total lymph nodes: 10/10 (prior lumpectomy she had 5 positive nodes) Adjuvant radiation to start 06/09/2023-07/21/23 ------------------------------------------------------------------------------------------------------------------------------------------------ CT CAP 05/22/2023: Similar to slightly decreased size of right axillary and subpectoral lymph nodes few prominent supraclavicular nodes increased or new (nonspecific versus metastatic), liver hypodensity indeterminate sclerosis left ninth rib fracture Bone scan 05/22/2023: Left ninth rib fracture otherwise negative MRI of the liver 06/12/23: Benign  Current treatment: Pembrolizumab maintenance every 3 weeks started 05/25/2023 with  capecitabine Toxicities: Skin rash: Responded to steroids but required prolonged steroid taper.  Rash is completely resolved.  Therefore we decided to retreat her again with [facility] and I prescribed her methylprednisolone just in case she gets a rash again.   Continue Capecitabine Toxicities: Intermittent loose stools but otherwise tolerating it well.

## 2023-07-06 NOTE — Progress Notes (Signed)
Patient Care Team: Caitlin Chimera, Caitlin [name] as PCP - General (Unknown Physician Specialty) Pershing Proud, RN as Oncology Nurse Navigator Donnelly Angelica, RN as Oncology Nurse Navigator Caitlin Croissant, Caitlin [name] as Consulting Physician (Hematology and Oncology) Caitlin Blackbird, Caitlin [name] as Consulting Physician (Radiation Oncology) Caitlin Loron, Caitlin [name] as Consulting Physician (General Surgery)  DIAGNOSIS:  Encounter Diagnoses  Name Primary?   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female, estrogen receptor positive (HCC) Yes   Skin rash     SUMMARY OF ONCOLOGIC HISTORY: Oncology History  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female, estrogen receptor positive (HCC)  09/15/2022 Initial Diagnosis   Screening mammogram detected right breast mass which measured 1.4 cm by ultrasound at 10 o'clock position, biopsy revealed grade 3 IDC with lymphovascular invasion ER 10%, PR 0%, Ki-67 30%, HER2 0 negative   09/25/2022 Cancer Staging   Staging form: Breast, AJCC 8th Edition - Clinical: Stage IB (cT1c, cN0, cM0, G3, ER+, PR-, HER2-) - Signed by Caitlin Croissant, Caitlin [name] on 09/25/2022 Histologic grading system: 3 grade system    Genetic Testing   [facility] CancerNext-Expanded Panel+RNA was Negative. Report date is 10/06/2021.  The CancerNext-Expanded gene panel offered by [facility] and includes sequencing, rearrangement, and RNA analysis for the following 77 genes: AIP, ALK, APC, ATM, AXIN2, BAP1, BARD1, BLM, BMPR1A, BRCA1, BRCA2, BRIP1, CDC73, CDH1, CDK4, CDKN1B, CDKN2A, CHEK2, CTNNA1, DICER1, FANCC, FH, FLCN, GALNT12, KIF1B, LZTR1, MAX, MEN1, MET, MLH1, MSH2, MSH3, MSH6, MUTYH, NBN, NF1, NF2, NTHL1, PALB2, PHOX2B, PMS2, POT1, PRKAR1A, PTCH1, PTEN, RAD51C, RAD51D, RB1, RECQL, RET, SDHA, SDHAF2, SDHB, SDHC, SDHD, SMAD4, SMARCA4, SMARCB1, SMARCE1, STK11, SUFU, TMEM127, TP53, TSC1, TSC2, VHL and XRCC2 (sequencing and deletion/duplication); EGFR, EGLN1, HOXB13, KIT, MITF,  PDGFRA, POLD1, and POLE (sequencing only); EPCAM and GREM1 (deletion/duplication only).    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 with high-grade DCIS, inferior margin positive, angiolymphatic invasion present, 5/5 lymph nodes positive, additional medial margin: Poorly differentiated carcinoma with lymphoid stroma, additional inferior margin: Positive, ER 10% weak, PR 0%, HER2 0, Ki-67 30%   10/21/2022 Cancer Staging   Staging form: Breast, AJCC 8th Edition - Pathologic: Stage IIIC (pT1c, pN2a, cM0, G3, ER-, PR-, HER2-) - Signed by Caitlin Croissant, Caitlin [name] on 10/21/2022 Stage prefix: Initial diagnosis Histologic grading system: 3 grade system   11/08/2022 Surgery   Margin reexcision: Inferior margin: Grade 3 IDC, new margin positive Medial margin: Grade 3 IDC new margin widely involved   11/18/2022 Surgery   Right inferior margin reexcision: Invasive poorly differentiated adenocarcinoma grade 3 present in new inferior margin.  Medial margin reexcision: Invasive poorly differentiated adenocarcinoma grade 3 present at new medial margin, posterior margin: Poorly differentiated adenocarcinoma grade 3, margin negative  11/08/2022 and 11/18/2022: Final margin focal positivity. After much discussion the plan is to finish her systemic treatment and come back at a later point to do mastectomy.    12/02/2022 - 04/18/2023 Chemotherapy   Patient is on Treatment Plan : BREAST ADJUVANT DOSE DENSE AC q14d / PACLitaxel q7d     05/03/2023 Surgery   Right mastectomy: 8 cm grade 3 IDC with extensive lymphatic and vascular invasion RCB class III, deep margin multifocally positive, 5/5 lymph nodes positive, ER weak 10%, PR 0%, HER2 0, Ki-67 30%   05/25/2023 -  Chemotherapy   Patient is on Treatment Plan : BREAST Pembrolizumab (200) q21d x 24 months  CHIEF COMPLIANT: Keytruda cycle 2  History of Present Illness   The  patient, with a history of breast cancer, presents for follow-up to discuss restarting Keytruda. She is better from the recent rash. The rash, which was initially located on the chest and legs, has since resolved. The patient was anxious about restarting Keytruda due to the rash, but is now open to trying it again. The patient also reports a headache that started before the rash. The rash did not start immediately after starting Keytruda, but rather about three weeks into treatment.     ALLERGIES:  is allergic to penicillins.  MEDICATIONS:  Current Outpatient Medications  Medication Sig Dispense Refill   acetaminophen (TYLENOL) 500 MG tablet Take 500 mg by mouth every 6 (six) hours as needed for moderate pain.     albuterol (VENTOLIN HFA) 108 (90 Base) MCG/ACT inhaler Inhale 2 puffs into the lungs every 4 (four) hours as needed for shortness of breath (Asthma).     aspirin 81 MG tablet Take 81 mg by mouth daily.       [START ON 07/25/2023] capecitabine (XELODA) 500 MG tablet Take 2 tablets (1,000 mg total) by mouth 2 (two) times daily after a meal. 14 days on and 7 days off 56 tablet 5   diclofenac Sodium (VOLTAREN) 1 % GEL Research Patient: Apply 0.5 grams(1 fingertip) to each hand and each foot twice daily for the duration of radiation therapy 200 g 0   diltiazem (CARDIZEM CD) 120 MG 24 hr capsule Take 120 mg by mouth daily.     levothyroxine (SYNTHROID) 75 MCG tablet Take 75 mcg by mouth daily before breakfast.     methylPREDNISolone (MEDROL DOSEPAK) 4 MG TBPK tablet Take 6 tabs for 3 days, 5 tabs for 3 days, 4 tabs for 3 days, 3 tabs for 3 days, 2 tabs for 3 days and 1 tab for 3 days 80 tablet 0   Multiple Vitamins-Minerals (MULTIVITAMIN WITH MINERALS) tablet Take 1 tablet by mouth daily.     OVER THE COUNTER MEDICATION Take 1 capsule by mouth 2 (two) times daily. copaiba softgels     prochlorperazine (COMPAZINE) 10 MG tablet TAKE 1 TABLET BY MOUTH EVERY 6 HOURS AS NEEDED FOR NAUSEA / FOR  VOMITING 30 tablet 1   PULMICORT FLEXHALER 180 MCG/ACT inhaler Inhale 1 puff into the lungs daily.     triamcinolone cream (KENALOG) 0.1 % Apply 1 Application topically daily as needed (Dry skin).     No current facility-administered medications for this visit.    PHYSICAL EXAMINATION: ECOG PERFORMANCE STATUS: 1 - Symptomatic but completely ambulatory  Vitals:   07/06/23 0807  BP: 127/70  Pulse: 75  Resp: 18  Temp: (!) 97.5 F (36.4 C)  SpO2: 99%   Filed Weights   07/06/23 0807  Weight: 144 lb 1.6 oz (65.4 kg)    Physical Exam   SKIN: Normal skin changes noted where rash was previously observed. No rash present at the time of examination.      (exam performed in the presence of a chaperone)  LABORATORY DATA:  I have reviewed the data as listed    Latest Ref Rng & Units 06/15/2023    8:19 AM 06/01/2023    9:09 AM 05/25/2023    8:56 AM  CMP  Glucose 70 - 99 mg/dL 161  87  91   BUN 8 - 23 mg/dL 21  14  22    Creatinine 0.44 - 1.00 mg/dL 0.96  0.45  4.09  Sodium 135 - 145 mmol/L 138  138  140   Potassium 3.5 - 5.1 mmol/L 4.0  4.0  4.0   Chloride 98 - 111 mmol/L 106  105  107   CO2 22 - 32 mmol/L 25  28  28    Calcium 8.9 - 10.3 mg/dL 9.3  9.3  9.3   Total Protein 6.5 - 8.1 g/dL 7.1  6.6  6.8   Total Bilirubin 0.3 - 1.2 mg/dL 0.5  0.3  0.3   Alkaline Phos 38 - 126 U/L 113  76  67   AST 15 - 41 U/L 39  22  16   ALT 0 - 44 U/L 79  26  22     Lab Results  Component Value Date   WBC 7.9 07/06/2023   HGB 11.8 (L) 07/06/2023   HCT 35.9 (L) 07/06/2023   MCV 94.5 07/06/2023   PLT 285 07/06/2023   NEUTROABS 6.9 07/06/2023    ASSESSMENT & PLAN:  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female, estrogen receptor positive (HCC) 10/14/2022:Right lumpectomy: Grade 3 invasive poorly differentiated ductal adenocarcinoma 1.4 cm with focal lobular features with high-grade DCIS, inferior margin positive, angiolymphatic invasion present, 5/5 lymph nodes  positive, additional medial margin: Poorly differentiated carcinoma with lymphoid stroma, additional inferior margin: Positive, ER 10% weak, PR 0%, HER2 0, Ki-67 30% T1c N2a: Stage IIIc pathological staging   CT CAP 10/29/2022: Several prominent lymph nodes and enlarged nodes right axilla and right supraclavicular region many of these are nonpathologic by size criteria.  No distant metastatic disease. Bone scan 10/29/2022: No evidence of bone metastasis   11/08/2022 and 11/18/2022: Margin reexcision surgeries: Final margin focal positivity.  After much discussion the plan is to finish her systemic treatment and come back at a later point to do mastectomy.   Treatment summary: Adjuvant chemotherapy with dose dense Adriamycin and Cytoxan followed by Taxol to be completed 05/02/2023 Mastectomy by Dr. Dwain Sarna 05/03/2023:Right mastectomy: 8 cm grade 3 IDC with extensive lymphatic and vascular invasion RCB class III, deep margin multifocally positive, 5/5 lymph nodes positive, ER weak 10%, PR 0%, HER2 0, Ki-67 30%, total lymph nodes: 10/10 (prior lumpectomy she had 5 positive nodes) Adjuvant radiation to start 06/09/2023-07/21/23 ------------------------------------------------------------------------------------------------------------------------------------------------ CT CAP 05/22/2023: Similar to slightly decreased size of right axillary and subpectoral lymph nodes few prominent supraclavicular nodes increased or new (nonspecific versus metastatic), liver hypodensity indeterminate sclerosis left ninth rib fracture Bone scan 05/22/2023: Left ninth rib fracture otherwise negative MRI of the liver 06/12/23: Benign  Current treatment: Pembrolizumab maintenance every 3 weeks started 05/25/2023 with capecitabine Toxicities: Skin rash: Responded to steroids but required prolonged steroid taper.  Rash is completely resolved.  Therefore we decided to retreat her again with [facility] and I prescribed her  methylprednisolone just in case she gets a rash again.   Continue Capecitabine Toxicities: Intermittent loose stools but otherwise tolerating it well.   ------------------------------------- Assessment and Plan    Keytruda-induced rash Resolved rash after discontinuation of Keytruda. Discussed the risk of recurrent rash with re-initiation of Keytruda. Patient willing to retry Keytruda with a plan for early intervention with steroids if rash recurs. -Resume Keytruda. -Prescribe Prednisone with a tapering dose starting at 6 tablets (20mg  each) for 3 days, then 5 tablets for 3 days, 4 tablets for 3 days, and 3 tablets for 3 days. Patient to start Prednisone at the first sign of rash.  Xeloda during radiation Tolerating well with no significant diarrhea. Radiation ongoing for  another 2.5 weeks. -Continue Xeloda Monday through Friday during radiation. -After radiation, change Xeloda to 14 days on, 7 days off.  No orders of the defined types were placed in this encounter.  The patient has a good understanding of the overall plan. she agrees with it. she will call with any problems that may develop before the next visit here. Total time spent: 30 mins including face to face time and time spent for planning, charting and co-ordination of care   Caitlin Meek, Caitlin [name] 07/06/23

## 2023-07-06 NOTE — Telephone Encounter (Signed)
Oral Oncology Patient Advocate Encounter  After completing a benefits investigation, prior authorization for capecitabine is not required at this time through [facility].  [name] copay is $364.10.    Patient is eligible for cash pricing at [facility] for $37.52 per cycle  Jinger Neighbors, CPhT-Adv Oncology Pharmacy Patient Advocate [facility] Direct Number: [phone]  Fax: [phone]

## 2023-07-07 ENCOUNTER — Other Ambulatory Visit: Payer: Self-pay

## 2023-07-07 ENCOUNTER — Ambulatory Visit
Admission: RE | Admit: 2023-07-07 | Discharge: 2023-07-07 | Disposition: A | Discharge status: Home / Self Care | Payer: BC Managed Care – PPO | Source: Ambulatory Visit | Attending: Radiation Oncology | Admitting: Radiation Oncology

## 2023-07-07 DIAGNOSIS — Z5112 Encounter for antineoplastic immunotherapy: Secondary | ICD-10-CM | POA: Diagnosis not present

## 2023-07-07 LAB — RAD ONC ARIA SESSION SUMMARY
Course Elapsed Days: 29
Plan Fractions Treated to Date: 11
Plan Fractions Treated to Date: 22
Plan Prescribed Dose Per Fraction: 2 Gy
Plan Prescribed Dose Per Fraction: 2 Gy
Plan Total Fractions Prescribed: 12
Plan Total Fractions Prescribed: 25
Plan Total Prescribed Dose: 24 Gy
Plan Total Prescribed Dose: 50 Gy
Reference Point Dosage Given to Date: 22 Gy
Reference Point Dosage Given to Date: 44 Gy
Reference Point Session Dosage Given: 2 Gy
Reference Point Session Dosage Given: 2 Gy
Session Number: 22

## 2023-07-07 LAB — T4: T4, Total: 9.1 ug/dL (ref 4.5–12.0)

## 2023-07-08 ENCOUNTER — Other Ambulatory Visit: Payer: Self-pay

## 2023-07-08 ENCOUNTER — Other Ambulatory Visit (HOSPITAL_COMMUNITY): Payer: Self-pay

## 2023-07-08 ENCOUNTER — Ambulatory Visit
Admission: RE | Admit: 2023-07-08 | Discharge: 2023-07-08 | Disposition: A | Discharge status: Home / Self Care | Payer: BC Managed Care – PPO | Source: Ambulatory Visit | Attending: Radiation Oncology | Admitting: Radiation Oncology

## 2023-07-08 DIAGNOSIS — Z5112 Encounter for antineoplastic immunotherapy: Secondary | ICD-10-CM | POA: Diagnosis not present

## 2023-07-08 LAB — RAD ONC ARIA SESSION SUMMARY
Course Elapsed Days: 30
Plan Fractions Treated to Date: 12
Plan Fractions Treated to Date: 23
Plan Prescribed Dose Per Fraction: 2 Gy
Plan Prescribed Dose Per Fraction: 2 Gy
Plan Total Fractions Prescribed: 13
Plan Total Fractions Prescribed: 25
Plan Total Prescribed Dose: 26 Gy
Plan Total Prescribed Dose: 50 Gy
Reference Point Dosage Given to Date: 24 Gy
Reference Point Dosage Given to Date: 46 Gy
Reference Point Session Dosage Given: 2 Gy
Reference Point Session Dosage Given: 2 Gy
Session Number: 23

## 2023-07-09 ENCOUNTER — Ambulatory Visit: Payer: BC Managed Care – PPO

## 2023-07-11 ENCOUNTER — Other Ambulatory Visit (HOSPITAL_COMMUNITY): Payer: Self-pay

## 2023-07-11 ENCOUNTER — Ambulatory Visit
Admission: RE | Admit: 2023-07-11 | Discharge: 2023-07-11 | Disposition: A | Discharge status: Home / Self Care | Payer: BC Managed Care – PPO | Source: Ambulatory Visit | Attending: Radiation Oncology | Admitting: Radiation Oncology

## 2023-07-11 ENCOUNTER — Other Ambulatory Visit: Payer: Self-pay

## 2023-07-11 DIAGNOSIS — Z5112 Encounter for antineoplastic immunotherapy: Secondary | ICD-10-CM | POA: Diagnosis not present

## 2023-07-11 LAB — RAD ONC ARIA SESSION SUMMARY
Course Elapsed Days: 33
Plan Fractions Treated to Date: 12
Plan Fractions Treated to Date: 24
Plan Prescribed Dose Per Fraction: 2 Gy
Plan Prescribed Dose Per Fraction: 2 Gy
Plan Total Fractions Prescribed: 12
Plan Total Fractions Prescribed: 25
Plan Total Prescribed Dose: 24 Gy
Plan Total Prescribed Dose: 50 Gy
Reference Point Dosage Given to Date: 24 Gy
Reference Point Dosage Given to Date: 48 Gy
Reference Point Session Dosage Given: 2 Gy
Reference Point Session Dosage Given: 2 Gy
Session Number: 24

## 2023-07-12 ENCOUNTER — Other Ambulatory Visit: Payer: Self-pay

## 2023-07-12 ENCOUNTER — Ambulatory Visit
Admission: RE | Admit: 2023-07-12 | Discharge: 2023-07-12 | Disposition: A | Discharge status: Home / Self Care | Payer: BC Managed Care – PPO | Source: Ambulatory Visit | Attending: Radiation Oncology | Admitting: Radiation Oncology

## 2023-07-12 DIAGNOSIS — Z5112 Encounter for antineoplastic immunotherapy: Secondary | ICD-10-CM | POA: Diagnosis not present

## 2023-07-12 DIAGNOSIS — Z170 Estrogen receptor positive status [ER+]: Secondary | ICD-10-CM

## 2023-07-12 LAB — RAD ONC ARIA SESSION SUMMARY
Course Elapsed Days: 34
Plan Fractions Treated to Date: 13
Plan Fractions Treated to Date: 25
Plan Prescribed Dose Per Fraction: 2 Gy
Plan Prescribed Dose Per Fraction: 2 Gy
Plan Total Fractions Prescribed: 13
Plan Total Fractions Prescribed: 25
Plan Total Prescribed Dose: 26 Gy
Plan Total Prescribed Dose: 50 Gy
Reference Point Dosage Given to Date: 26 Gy
Reference Point Dosage Given to Date: 50 Gy
Reference Point Session Dosage Given: 2 Gy
Reference Point Session Dosage Given: 2 Gy
Session Number: 25

## 2023-07-13 ENCOUNTER — Ambulatory Visit
Admission: RE | Admit: 2023-07-13 | Discharge: 2023-07-13 | Disposition: A | Discharge status: Home / Self Care | Payer: BC Managed Care – PPO | Source: Ambulatory Visit | Attending: Radiation Oncology | Admitting: Radiation Oncology

## 2023-07-13 ENCOUNTER — Other Ambulatory Visit: Payer: Self-pay

## 2023-07-13 ENCOUNTER — Ambulatory Visit: Payer: BC Managed Care – PPO

## 2023-07-13 ENCOUNTER — Encounter: Payer: Self-pay | Admitting: Hematology and Oncology

## 2023-07-13 DIAGNOSIS — C50411 Malignant neoplasm of upper-outer quadrant of right female breast: Secondary | ICD-10-CM

## 2023-07-13 DIAGNOSIS — Z5112 Encounter for antineoplastic immunotherapy: Secondary | ICD-10-CM | POA: Diagnosis not present

## 2023-07-13 LAB — RAD ONC ARIA SESSION SUMMARY
Course Elapsed Days: 35
Plan Fractions Treated to Date: 1
Plan Prescribed Dose Per Fraction: 2 Gy
Plan Total Fractions Prescribed: 7
Plan Total Prescribed Dose: 14 Gy
Reference Point Dosage Given to Date: 2 Gy
Reference Point Session Dosage Given: 2 Gy
Session Number: 26

## 2023-07-14 ENCOUNTER — Ambulatory Visit
Admission: RE | Admit: 2023-07-14 | Discharge: 2023-07-14 | Disposition: A | Discharge status: Home / Self Care | Payer: BC Managed Care – PPO | Source: Ambulatory Visit | Attending: Radiation Oncology | Admitting: Radiation Oncology

## 2023-07-14 ENCOUNTER — Other Ambulatory Visit: Payer: Self-pay

## 2023-07-14 ENCOUNTER — Ambulatory Visit: Payer: BC Managed Care – PPO

## 2023-07-14 DIAGNOSIS — Z5112 Encounter for antineoplastic immunotherapy: Secondary | ICD-10-CM | POA: Diagnosis not present

## 2023-07-14 LAB — RAD ONC ARIA SESSION SUMMARY
Course Elapsed Days: 36
Plan Fractions Treated to Date: 2
Plan Prescribed Dose Per Fraction: 2 Gy
Plan Total Fractions Prescribed: 7
Plan Total Prescribed Dose: 14 Gy
Reference Point Dosage Given to Date: 4 Gy
Reference Point Session Dosage Given: 2 Gy
Session Number: 27

## 2023-07-15 ENCOUNTER — Ambulatory Visit: Payer: BC Managed Care – PPO

## 2023-07-15 ENCOUNTER — Ambulatory Visit
Admission: RE | Admit: 2023-07-15 | Discharge: 2023-07-15 | Disposition: A | Discharge status: Home / Self Care | Payer: BC Managed Care – PPO | Source: Ambulatory Visit | Attending: Radiation Oncology | Admitting: Radiation Oncology

## 2023-07-15 ENCOUNTER — Other Ambulatory Visit: Payer: Self-pay

## 2023-07-15 DIAGNOSIS — Z5112 Encounter for antineoplastic immunotherapy: Secondary | ICD-10-CM | POA: Diagnosis not present

## 2023-07-15 LAB — RAD ONC ARIA SESSION SUMMARY
Course Elapsed Days: 37
Plan Fractions Treated to Date: 3
Plan Prescribed Dose Per Fraction: 2 Gy
Plan Total Fractions Prescribed: 7
Plan Total Prescribed Dose: 14 Gy
Reference Point Dosage Given to Date: 6 Gy
Reference Point Session Dosage Given: 2 Gy
Session Number: 28

## 2023-07-18 ENCOUNTER — Ambulatory Visit
Admission: RE | Admit: 2023-07-18 | Discharge: 2023-07-18 | Disposition: A | Discharge status: Home / Self Care | Payer: BC Managed Care – PPO | Source: Ambulatory Visit | Attending: Radiation Oncology | Admitting: Radiation Oncology

## 2023-07-18 ENCOUNTER — Ambulatory Visit: Payer: BC Managed Care – PPO

## 2023-07-18 ENCOUNTER — Other Ambulatory Visit: Payer: Self-pay

## 2023-07-18 ENCOUNTER — Other Ambulatory Visit: Payer: Self-pay | Admitting: Pharmacy Technician

## 2023-07-18 DIAGNOSIS — Z5112 Encounter for antineoplastic immunotherapy: Secondary | ICD-10-CM | POA: Diagnosis not present

## 2023-07-18 LAB — RAD ONC ARIA SESSION SUMMARY
Course Elapsed Days: 40
Plan Fractions Treated to Date: 4
Plan Prescribed Dose Per Fraction: 2 Gy
Plan Total Fractions Prescribed: 7
Plan Total Prescribed Dose: 14 Gy
Reference Point Dosage Given to Date: 8 Gy
Reference Point Session Dosage Given: 2 Gy
Session Number: 29

## 2023-07-18 NOTE — Progress Notes (Signed)
Specialty Pharmacy Initial Fill Coordination Note  Caitlin [name] is a 67 y.o. female contacted today regarding refills of specialty medication(s) Capecitabine .  Patient requested [name] at [facility] at [place]  on 07/19/23   Medication will be filled on 07/18/23.   Patient is aware of $37.52 cash price.

## 2023-07-18 NOTE — Progress Notes (Signed)
Oral Oncology Pharmacist Encounter  Patient counseled in progress note on 06/03/23. Patient is continuing therapy with a new dosing schedule. Patient counseled that she will take for 14 days on, 7 days off. Repeat every 21 days.  Patient will start new xeloda dosing on 07/25/2023.  Caitlin [name], PharmD Hematology/Oncology Clinical Pharmacist [facility] [phone]

## 2023-07-19 ENCOUNTER — Ambulatory Visit
Admission: RE | Admit: 2023-07-19 | Discharge: 2023-07-19 | Disposition: A | Discharge status: Home / Self Care | Payer: BC Managed Care – PPO | Source: Ambulatory Visit | Attending: Radiation Oncology | Admitting: Radiation Oncology

## 2023-07-19 ENCOUNTER — Ambulatory Visit: Payer: BC Managed Care – PPO

## 2023-07-19 ENCOUNTER — Other Ambulatory Visit: Payer: Self-pay

## 2023-07-19 ENCOUNTER — Other Ambulatory Visit (HOSPITAL_COMMUNITY): Payer: Self-pay

## 2023-07-19 ENCOUNTER — Ambulatory Visit: Payer: BC Managed Care – PPO | Admitting: Radiation Oncology

## 2023-07-19 DIAGNOSIS — Z5112 Encounter for antineoplastic immunotherapy: Secondary | ICD-10-CM | POA: Diagnosis not present

## 2023-07-19 LAB — RAD ONC ARIA SESSION SUMMARY
Course Elapsed Days: 41
Plan Fractions Treated to Date: 5
Plan Prescribed Dose Per Fraction: 2 Gy
Plan Total Fractions Prescribed: 7
Plan Total Prescribed Dose: 14 Gy
Reference Point Dosage Given to Date: 10 Gy
Reference Point Session Dosage Given: 2 Gy
Session Number: 30

## 2023-07-20 ENCOUNTER — Other Ambulatory Visit: Payer: Self-pay

## 2023-07-20 ENCOUNTER — Ambulatory Visit
Admission: RE | Admit: 2023-07-20 | Discharge: 2023-07-20 | Disposition: A | Discharge status: Home / Self Care | Payer: BC Managed Care – PPO | Source: Ambulatory Visit | Attending: Radiation Oncology | Admitting: Radiation Oncology

## 2023-07-20 ENCOUNTER — Ambulatory Visit: Payer: BC Managed Care – PPO

## 2023-07-20 DIAGNOSIS — Z5112 Encounter for antineoplastic immunotherapy: Secondary | ICD-10-CM | POA: Diagnosis not present

## 2023-07-20 LAB — RAD ONC ARIA SESSION SUMMARY
Course Elapsed Days: 42
Plan Fractions Treated to Date: 6
Plan Prescribed Dose Per Fraction: 2 Gy
Plan Total Fractions Prescribed: 7
Plan Total Prescribed Dose: 14 Gy
Reference Point Dosage Given to Date: 12 Gy
Reference Point Session Dosage Given: 2 Gy
Session Number: 31

## 2023-07-21 ENCOUNTER — Ambulatory Visit
Admission: RE | Admit: 2023-07-21 | Discharge: 2023-07-21 | Disposition: A | Discharge status: Home / Self Care | Payer: BC Managed Care – PPO | Source: Ambulatory Visit | Attending: Radiation Oncology | Admitting: Radiation Oncology

## 2023-07-21 ENCOUNTER — Other Ambulatory Visit: Payer: Self-pay

## 2023-07-21 ENCOUNTER — Ambulatory Visit: Payer: BC Managed Care – PPO

## 2023-07-21 DIAGNOSIS — Z5112 Encounter for antineoplastic immunotherapy: Secondary | ICD-10-CM | POA: Diagnosis not present

## 2023-07-21 LAB — RAD ONC ARIA SESSION SUMMARY
Course Elapsed Days: 43
Plan Fractions Treated to Date: 7
Plan Prescribed Dose Per Fraction: 2 Gy
Plan Total Fractions Prescribed: 7
Plan Total Prescribed Dose: 14 Gy
Reference Point Dosage Given to Date: 14 Gy
Reference Point Session Dosage Given: 2 Gy
Session Number: 32

## 2023-07-22 ENCOUNTER — Ambulatory Visit: Payer: BC Managed Care – PPO

## 2023-07-23 NOTE — Radiation Completion Notes (Signed)
Patient Name: Caitlin [name], Caitlin [name] MRN: [id number] Date of Birth: 1956/07/23 Referring Physician: [name], M.D. Date of Service: 2023-07-23 Radiation Oncologist: Arnette Schaumann, M.D. [facility]                             RADIATION ONCOLOGY END OF TREATMENT NOTE     Diagnosis: C50.411 Malignant neoplasm of upper-outer quadrant of right female breast Staging on 2022-10-21: 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female, estrogen receptor positive (HCC) T=pT1c, N=pN2a, M=cM0 Staging on 2022-09-25: 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female, estrogen receptor positive (HCC) T=cT1c, N=cN0, M=cM0 Intent: Curative     ==========DELIVERED PLANS==========  First Treatment Date: 2023-06-08 - Last Treatment Date: 2023-07-21   Plan Name: CW_R_BO Site: Chest Wall, Right Technique: 3D Mode: Photon Dose Per Fraction: 2 Gy Prescribed Dose (Delivered / Prescribed): 26 Gy / 26 Gy Prescribed Fxs (Delivered / Prescribed): 13 / 13   Plan Name: CW_R_SCV_PAB Site: Chest Wall, Right Technique: 3D Mode: Photon Dose Per Fraction: 2 Gy Prescribed Dose (Delivered / Prescribed): 50 Gy / 50 Gy Prescribed Fxs (Delivered / Prescribed): 25 / 25   Plan Name: CW_R_Bst_BO Site: Chest Wall, Right Technique: Electron Mode: Electron Dose Per Fraction: 2 Gy Prescribed Dose (Delivered / Prescribed): 14 Gy / 14 Gy Prescribed Fxs (Delivered / Prescribed): 7 / 7   Plan Name: CW_R Site: Chest Wall, Right Technique: 3D Mode: Photon Dose Per Fraction: 2 Gy Prescribed Dose (Delivered / Prescribed): 24 Gy / 24 Gy Prescribed Fxs (Delivered / Prescribed): 12 / 12     ==========ON TREATMENT VISIT DATES========== 2023-06-14, 2023-06-21, 2023-06-28, 2023-07-05, 2023-07-12, 2023-07-19     ==========UPCOMING VISITS==========       ==========APPENDIX - ON TREATMENT VISIT NOTES==========   See weekly On Treatment Notes in Epic for  details.

## 2023-07-25 ENCOUNTER — Ambulatory Visit: Payer: BC Managed Care – PPO

## 2023-07-26 ENCOUNTER — Ambulatory Visit: Payer: BC Managed Care – PPO

## 2023-07-27 ENCOUNTER — Encounter: Payer: Self-pay | Admitting: *Deleted

## 2023-07-27 ENCOUNTER — Inpatient Hospital Stay: Payer: BC Managed Care – PPO | Admitting: Adult Health

## 2023-07-27 ENCOUNTER — Other Ambulatory Visit: Payer: Self-pay

## 2023-07-27 ENCOUNTER — Inpatient Hospital Stay: Payer: BC Managed Care – PPO

## 2023-07-27 ENCOUNTER — Encounter: Payer: Self-pay | Admitting: Adult Health

## 2023-07-27 ENCOUNTER — Ambulatory Visit: Payer: BC Managed Care – PPO

## 2023-07-27 VITALS — BP 114/60 | HR 76 | Temp 97.4°F | Resp 18 | Ht 63.0 in | Wt 147.9 lb

## 2023-07-27 DIAGNOSIS — C50411 Malignant neoplasm of upper-outer quadrant of right female breast: Secondary | ICD-10-CM

## 2023-07-27 DIAGNOSIS — Z170 Estrogen receptor positive status [ER+]: Secondary | ICD-10-CM

## 2023-07-27 DIAGNOSIS — R21 Rash and other nonspecific skin eruption: Secondary | ICD-10-CM

## 2023-07-27 DIAGNOSIS — Z95828 Presence of other vascular implants and grafts: Secondary | ICD-10-CM

## 2023-07-27 DIAGNOSIS — R42 Dizziness and giddiness: Secondary | ICD-10-CM | POA: Diagnosis not present

## 2023-07-27 DIAGNOSIS — Z5112 Encounter for antineoplastic immunotherapy: Secondary | ICD-10-CM | POA: Diagnosis not present

## 2023-07-27 LAB — CMP (CANCER CENTER ONLY)
ALT: 12 U/L (ref 0–44)
AST: 12 U/L — ABNORMAL LOW (ref 15–41)
Albumin: 4 g/dL (ref 3.5–5.0)
Alkaline Phosphatase: 70 U/L (ref 38–126)
Anion gap: 6 (ref 5–15)
BUN: 19 mg/dL (ref 8–23)
CO2: 27 mmol/L (ref 22–32)
Calcium: 9.1 mg/dL (ref 8.9–10.3)
Chloride: 105 mmol/L (ref 98–111)
Creatinine: 0.87 mg/dL (ref 0.44–1.00)
GFR, Estimated: >60 mL/min (ref >60)
Glucose, Bld: 85 mg/dL (ref 70–99)
Potassium: 3.8 mmol/L (ref 3.5–5.1)
Sodium: 138 mmol/L (ref 135–145)
Total Bilirubin: 0.5 mg/dL (ref 0.3–1.2)
Total Protein: 6.5 g/dL (ref 6.5–8.1)

## 2023-07-27 LAB — CBC WITH DIFFERENTIAL (CANCER CENTER ONLY)
Abs Immature Granulocytes: 0.17 10*3/uL — ABNORMAL HIGH (ref 0.00–0.07)
Basophils Absolute: 0 10*3/uL (ref 0.0–0.1)
Basophils Relative: 0 %
Eosinophils Absolute: 0.1 10*3/uL (ref 0.0–0.5)
Eosinophils Relative: 1 %
HCT: 36.2 % (ref 36.0–46.0)
Hemoglobin: 11.9 g/dL — ABNORMAL LOW (ref 12.0–15.0)
Immature Granulocytes: 2 %
Lymphocytes Relative: 5 %
Lymphs Abs: 0.4 10*3/uL — ABNORMAL LOW (ref 0.7–4.0)
MCH: 32.4 pg (ref 26.0–34.0)
MCHC: 32.9 g/dL (ref 30.0–36.0)
MCV: 98.6 fL (ref 80.0–100.0)
Monocytes Absolute: 0.7 10*3/uL (ref 0.1–1.0)
Monocytes Relative: 9 %
Neutro Abs: 6.4 10*3/uL (ref 1.7–7.7)
Neutrophils Relative %: 83 %
Platelet Count: 262 10*3/uL (ref 150–400)
RBC: 3.67 MIL/uL — ABNORMAL LOW (ref 3.87–5.11)
RDW: 21.2 % — ABNORMAL HIGH (ref 11.5–15.5)
WBC Count: 7.8 10*3/uL (ref 4.0–10.5)
nRBC: 0 % (ref 0.0–0.2)

## 2023-07-27 LAB — TSH: TSH: 4.466 u[IU]/mL (ref 0.350–4.500)

## 2023-07-27 MED ORDER — HEPARIN SOD (PORK) LOCK FLUSH 100 UNIT/ML IV SOLN
500.0000 [IU] | Freq: Once | INTRAVENOUS | Status: AC | PRN
  Administered 2023-07-27: 500 [IU]

## 2023-07-27 MED ORDER — SODIUM CHLORIDE 0.9 % IV SOLN: Freq: Once | INTRAVENOUS | Status: AC

## 2023-07-27 MED ORDER — SODIUM CHLORIDE 0.9% FLUSH
10.0000 mL | Freq: Once | INTRAVENOUS | Status: AC
  Administered 2023-07-27: 10 mL

## 2023-07-27 MED ORDER — METHYLPREDNISOLONE 4 MG PO TBPK: ORAL_TABLET | ORAL | 0 refills | Status: DC

## 2023-07-27 MED ORDER — SODIUM CHLORIDE 0.9% FLUSH: 10.0000 mL | INTRAVENOUS | Status: DC | PRN

## 2023-07-27 MED ORDER — SODIUM CHLORIDE 0.9 % IV SOLN
200.0000 mg | Freq: Once | INTRAVENOUS | Status: AC
  Administered 2023-07-27: 200 mg via INTRAVENOUS
  Filled 2023-07-27: qty 200

## 2023-07-27 NOTE — Assessment & Plan Note (Signed)
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  No distant metastatic disease. Bone scan 10/29/2022: No evidence of bone metastasis   11/08/2022 and 11/18/2022: Margin reexcision surgeries: Final margin focal positivity.  After much discussion the plan is to finish her systemic treatment and come back at a later point to do mastectomy.   Treatment summary: Adjuvant chemotherapy with dose dense Adriamycin and Cytoxan followed by Taxol to be completed 05/02/2023 Mastectomy by Dr. Dwain Sarna 05/03/2023:Right mastectomy: 8 cm grade 3 IDC with extensive lymphatic and vascular invasion RCB class III, deep margin multifocally positive, 5/5 lymph nodes positive, ER weak 10%, PR 0%, HER2 0, Ki-67 30%, total lymph nodes: 10/10 (prior lumpectomy she had 5 positive nodes) Adjuvant radiation to start 06/09/2023-07/21/23 ------------------------------------------------------------------------------------------------------------------------------------------------ CT CAP 05/22/2023: Similar to slightly decreased size of right axillary and subpectoral lymph nodes few prominent supraclavicular nodes increased or new (nonspecific versus metastatic), liver hypodensity indeterminate sclerosis left ninth rib fracture Bone scan 05/22/2023: Left ninth rib fracture otherwise negative MRI of the liver 06/12/23: Benign  Current treatment: Pembrolizumab maintenance every 3 weeks started 05/25/2023 with  capecitabine Toxicities: Skin rash: Prescribed steroid taper if the rash persists.  I let her know that theoretically prednisone can interact with the pembrolizumab, and if she continues to need it we may need to discuss whether pembrolizumab is able to be effective. Continue Capecitabine  She is experiencing intermittent dizziness.  I ordered an MRI of the brain to further evaluate.  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 Chest wall pain.  This could very well be secondary to the radiation that she just completed.  I will hold off on repeating CT imaging unless the pain worsens at this time.  She will call if the pain returns and we will order CT chest.  RTC in 3 weeks for labs, f/u and her next treatment.

## 2023-07-27 NOTE — Progress Notes (Signed)
[facility] Cancer Follow up:    Caitlin Chimera, MD [address] [place] [address]   DIAGNOSIS:  Cancer Staging  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female, estrogen receptor positive (HCC) Staging form: Breast, AJCC 8th Edition - Clinical: Stage IB (cT1c, cN0, cM0, G3, ER+, PR-, HER2-) - Signed by [name], MD on 09/25/2022 Histologic grading system: 3 grade system - Pathologic: Stage IIIC (pT1c, pN2a, cM0, G3, ER-, PR-, HER2-) - Signed by [name], MD on 10/21/2022 Stage prefix: Initial diagnosis Histologic grading system: 3 grade system   SUMMARY OF ONCOLOGIC HISTORY: Oncology History  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female, estrogen receptor positive (HCC)  09/15/2022 Initial Diagnosis   Screening mammogram detected right breast mass which measured 1.4 cm by ultrasound at 10 o'clock position, biopsy revealed grade 3 IDC with lymphovascular invasion ER 10%, PR 0%, Ki-67 30%, HER2 0 negative   09/25/2022 Cancer Staging   Staging form: Breast, AJCC 8th Edition - Clinical: Stage IB (cT1c, cN0, cM0, G3, ER+, PR-, HER2-) - Signed by [name], MD on 09/25/2022 Histologic grading system: 3 grade system    Genetic Testing   [facility] CancerNext-Expanded Panel+RNA was Negative. Report date is 10/06/2021.  The CancerNext-Expanded gene panel offered by [facility] and includes sequencing, rearrangement, and RNA analysis for the following 77 genes: AIP, ALK, APC, ATM, AXIN2, BAP1, BARD1, BLM, BMPR1A, BRCA1, BRCA2, BRIP1, CDC73, CDH1, CDK4, CDKN1B, CDKN2A, CHEK2, CTNNA1, DICER1, FANCC, FH, FLCN, GALNT12, KIF1B, LZTR1, MAX, MEN1, MET, MLH1, MSH2, MSH3, MSH6, MUTYH, NBN, NF1, NF2, NTHL1, PALB2, PHOX2B, PMS2, POT1, PRKAR1A, PTCH1, PTEN, RAD51C, RAD51D, RB1, RECQL, RET, SDHA, SDHAF2, SDHB, SDHC, SDHD, SMAD4, SMARCA4, SMARCB1, SMARCE1, STK11, SUFU, TMEM127, TP53, TSC1, TSC2, VHL and XRCC2 (sequencing and  deletion/duplication); EGFR, EGLN1, HOXB13, KIT, MITF, PDGFRA, POLD1, and POLE (sequencing only); EPCAM and GREM1 (deletion/duplication only).    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 with high-grade DCIS, inferior margin positive, angiolymphatic invasion present, 5/5 lymph nodes positive, additional medial margin: Poorly differentiated carcinoma with lymphoid stroma, additional inferior margin: Positive, ER 10% weak, PR 0%, HER2 0, Ki-67 30%   10/21/2022 Cancer Staging   Staging form: Breast, AJCC 8th Edition - Pathologic: Stage IIIC (pT1c, pN2a, cM0, G3, ER-, PR-, HER2-) - Signed by [name], MD on 10/21/2022 Stage prefix: Initial diagnosis Histologic grading system: 3 grade system   11/08/2022 Surgery   Margin reexcision: Inferior margin: Grade 3 IDC, new margin positive Medial margin: Grade 3 IDC new margin widely involved   11/18/2022 Surgery   Right inferior margin reexcision: Invasive poorly differentiated adenocarcinoma grade 3 present in new inferior margin.  Medial margin reexcision: Invasive poorly differentiated adenocarcinoma grade 3 present at new medial margin, posterior margin: Poorly differentiated adenocarcinoma grade 3, margin negative  11/08/2022 and 11/18/2022: Final margin focal positivity. After much discussion the plan is to finish her systemic treatment and come back at a later point to do mastectomy.    12/02/2022 - 04/18/2023 Chemotherapy   Patient is on Treatment Plan : BREAST ADJUVANT DOSE DENSE AC q14d / PACLitaxel q7d     05/03/2023 Surgery   Right mastectomy: 8 cm grade 3 IDC with extensive lymphatic and vascular invasion RCB class III, deep margin multifocally positive, 5/5 lymph nodes positive, ER weak 10%, PR 0%, HER2 0, Ki-67 30%   05/25/2023 -  Chemotherapy   Patient is  on Treatment Plan : BREAST Pembrolizumab (200) q21d x 24 months       CURRENT THERAPY:  Pembrolizumab, capecitabine  INTERVAL HISTORY: Caitlin [name] 67 y.o. female returns for her to receiving her pembrolizumab treatment.  She is tolerating this moderately well.  She did experience a rash with her past treatment that required a steroid taper.  He is requesting 1 to have on hand if that happens again.  She notes some right chest wall discomfort.  She completed her adjuvant radiation a week ago.  She notes occasional dizzy feelings and imbalance that occurs intermittently.  She denies any focal weakness, numbness, strength changes or any other concerns today.  She is taking capecitabine 2 tablets twice a day 2 weeks on and 1 week off.  This well and denies any bowel or bladder issues, skin peeling in her hands or feet.  The rash and above has been evaluated on whether the capecitabine was contributing and it was ruled out as an etiology.   Patient Active Problem List   Diagnosis Date Noted   Breast CA (HCC) 05/03/2023   S/P mastectomy, right 05/03/2023   Port-A-Cath in place 12/16/2022   Genetic testing 10/11/2022   Appendicitis with peritonitis 09/29/2022   Family history of prostate cancer 09/29/2022   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female, estrogen receptor positive (HCC) 09/22/2022   Atrial fibrillation with RVR (HCC) 11/30/2010   Chronic anticoagulation 11/30/2010   Hypothyroidism 11/30/2010   PALPITATIONS 11/27/2010    is allergic to penicillins.  MEDICAL HISTORY: Past Medical History:  Diagnosis Date   Asthma    Complication of anesthesia    Dyspnea    Dysrhythmia    2012   Headache    History of kidney stones    passed   Hypothyroidism    Pneumonia 01/26/2023   PONV (postoperative nausea and vomiting)    rt breast ca 08/2022   Right Breast   Scoliosis    Thyroid disease     SURGICAL HISTORY: Past Surgical History:  Procedure Laterality Date   APPENDECTOMY     AXILLARY SENTINEL NODE BIOPSY Right 10/14/2022    Procedure: RIGHT AXILLARY SENTINEL NODE BIOPSY;  Surgeon: [name], MD;  Location: [facility] OR;  Service: General;  Laterality: Right;   BREAST BIOPSY Right 09/15/2022   [place] RT BREAST BX W LOC DEV 1ST LESION IMG BX SPEC US GUIDE 09/15/2022 [facility]MO[facility]   BREAST BIOPSY  10/12/2022   [place] RT RADIOACTIVE SEED LOC 10/12/2022 [facility] MAMMOGR[facility]   BREAST LUMPECTOMY WITH RADIOACTIVE SEED AND SENTINEL LYMPH NODE BIOPSY Right 10/14/2022   Procedure: RIGHT BREAST LUMPECTOMY WITH RADIOACTIVE SEED;  Surgeon: [name], MD;  Location: [facility] OR;  Service: General;  Laterality: Right;   COLONOSCOPY     LAPAROSCOPIC APPENDECTOMY     PORT-A-CATH REMOVAL Left 05/03/2023   Procedure: REMOVAL PORT-A-CATH;  Surgeon: [name], MD;  Location: [facility] OR;  Service: General;  Laterality: Left;   PORTACATH PLACEMENT Left 10/14/2022   Procedure: INSERTION PORT-A-CATH;  Surgeon: [name], MD;  Location: [facility] OR;  Service: General;  Laterality: Left;   PORTACATH PLACEMENT Left 05/19/2023   Procedure: PORT PLACEMENT WITH ULTRASOUND GUIDANCE;  Surgeon: [name], MD;  Location: [facility];  Service: General;  Laterality: Left;   RE-EXCISION OF BREAST LUMPECTOMY Right 11/08/2022   Procedure: RE-EXCISION OF RIGHT BREAST LUMPECTOMY;  Surgeon: [name], MD;  Location: [facility];  Service: General;  Laterality: Right;  RE-EXCISION OF BREAST LUMPECTOMY Right 11/18/2022   Procedure: RE-EXCISION OF RIGHT BREAST LUMPECTOMY;  Surgeon: [name], MD;  Location: [facility] OR;  Service: General;  Laterality: Right;   SIMPLE MASTECTOMY WITH AXILLARY SENTINEL NODE BIOPSY Right 05/03/2023   Procedure: RIGHT MASTECTOMY;  Surgeon: [name], MD;  Location: [facility] OR;  Service: General;  Laterality: Right;  GEN & PEC BLOCK   WISDOM TOOTH EXTRACTION      SOCIAL HISTORY: Social History   Socioeconomic History   Marital status: Married    Spouse name: Not on file    Number of children: Not on file   Years of education: Not on file   Highest education level: Not on file  Occupational History   Not on file  Tobacco Use   Smoking status: Never   Smokeless tobacco: Not on file  Vaping Use   Vaping status: Never Used  Substance and Sexual Activity   Alcohol use: Not Currently   Drug use: Never   Sexual activity: Not Currently  Other Topics Concern   Not on file  Social History Narrative   Not on file   Social Determinants of Health   Financial Resource Strain: Not on file  Food Insecurity: No Food Insecurity (05/27/2023)   Hunger Vital Sign    Worried About Running Out of Food in the Last Year: Never true    Ran Out of Food in the Last Year: Never true  Transportation Needs: No Transportation Needs (05/27/2023)   PRAPARE - Administrator, Civil Service (Medical): No    Lack of Transportation (Non-Medical): No  Physical Activity: Sufficiently Active (06/19/2020)   [facility], [facility]   Exercise Vital Sign    Days of Exercise per Week: 7 days    Minutes of Exercise per Session: 30 min  Stress: Not on file  Social Connections: Not on file  Intimate Partner Violence: Not At Risk (05/27/2023)   Humiliation, Afraid, Rape, and Kick questionnaire    Fear of Current or Ex-Partner: No    Emotionally Abused: No    Physically Abused: No    Sexually Abused: No    FAMILY HISTORY: Family History  Problem Relation Age of Onset   Thyroid disease Mother    Prostate cancer Father 12       metastatic   Hypertension Father    Thyroid disease Sister        twin sister age 55   Asthma Sister    Prostate cancer Brother 1   Heart attack Maternal Grandmother        died age 52's   Heart attack Maternal Grandfather        died age 54's   Clotting disorder Paternal Grandmother        cerebral hemorrage    Review of Systems  Constitutional:  Negative for appetite change, chills, fatigue, fever and unexpected  weight change.  HENT:   Negative for hearing loss, lump/mass, mouth sores, trouble swallowing and voice change.   Eyes:  Negative for eye problems and icterus.  Respiratory:  Negative for chest tightness, cough and shortness of breath.   Cardiovascular:  Negative for chest pain, leg swelling and palpitations.  Gastrointestinal:  Negative for abdominal distention, abdominal pain, constipation, diarrhea, nausea and vomiting.  Endocrine: Negative for hot flashes.  Genitourinary:  Negative for difficulty urinating.   Musculoskeletal:  Positive for gait problem. Negative for arthralgias.  Skin:  Negative for itching and rash.  Neurological:  Positive for dizziness and gait problem. Negative for extremity weakness, headaches, light-headedness, numbness, seizures and speech difficulty.  Hematological:  Negative for adenopathy. Does not bruise/bleed easily.  Psychiatric/Behavioral:  Negative for depression. The patient is not nervous/anxious.       PHYSICAL EXAMINATION    Vitals:   07/27/23 0839  BP: 114/60  Pulse: 76  Resp: 18  Temp: (!) 97.4 F (36.3 C)  SpO2: 99%    Physical Exam Constitutional:      General: She is not in acute distress.    Appearance: Normal appearance. She is not toxic-appearing.  HENT:     Head: Normocephalic and atraumatic.     Mouth/Throat:     Mouth: Mucous membranes are moist.     Pharynx: Oropharynx is clear. No oropharyngeal exudate or posterior oropharyngeal erythema.  Eyes:     General: No scleral icterus. Cardiovascular:     Rate and Rhythm: Normal rate and regular rhythm.     Pulses: Normal pulses.     Heart sounds: Normal heart sounds.  Pulmonary:     Effort: Pulmonary effort is normal.     Breath sounds: Normal breath sounds.  Chest:     Comments: Right chest wall status post radiation, there are no palpable nodules or signs of recurrence noted. Abdominal:     General: Abdomen is flat. Bowel sounds are normal. There is no distension.      Palpations: Abdomen is soft.     Tenderness: There is no abdominal tenderness.  Musculoskeletal:        General: No swelling.     Cervical back: Neck supple.  Lymphadenopathy:     Cervical: No cervical adenopathy.     Upper Body:     Right upper body: No axillary adenopathy.     Left upper body: No axillary adenopathy.  Skin:    General: Skin is warm and dry.     Findings: No rash.  Neurological:     General: No focal deficit present.     Mental Status: She is alert.  Psychiatric:        Mood and Affect: Mood normal.        Behavior: Behavior normal.     LABORATORY DATA:  CBC    Component Value Date/Time   WBC 7.8 07/27/2023 0807   WBC 11.1 (H) 10/13/2022 0930   RBC 3.67 (L) 07/27/2023 0807   HGB 11.9 (L) 07/27/2023 0807   HCT 36.2 07/27/2023 0807   PLT 262 07/27/2023 0807   MCV 98.6 07/27/2023 0807   MCH 32.4 07/27/2023 0807   MCHC 32.9 07/27/2023 0807   RDW 21.2 (H) 07/27/2023 0807   LYMPHSABS 0.4 (L) 07/27/2023 0807   MONOABS 0.7 07/27/2023 0807   EOSABS 0.1 07/27/2023 0807   BASOSABS 0.0 07/27/2023 0807    CMP     Component Value Date/Time   NA 138 07/27/2023 0807   K 3.8 07/27/2023 0807   CL 105 07/27/2023 0807   CO2 27 07/27/2023 0807   GLUCOSE 85 07/27/2023 0807   BUN 19 07/27/2023 0807   CREATININE 0.87 07/27/2023 0807   CALCIUM 9.1 07/27/2023 0807   PROT 6.5 07/27/2023 0807   ALBUMIN 4.0 07/27/2023 0807   AST 12 (L) 07/27/2023 0807   ALT 12 07/27/2023 0807   ALKPHOS 70 07/27/2023 0807   BILITOT 0.5 07/27/2023 0807   GFRNONAA >60 07/27/2023 0807      ASSESSMENT and THERAPY PLAN:   Malignant neoplasm of upper-outer quadrant of right breast  in female, estrogen receptor positive (HCC) 10/14/2022:Right lumpectomy: Grade 3 invasive poorly differentiated ductal adenocarcinoma 1.4 cm with focal lobular features with high-grade DCIS, inferior margin positive, angiolymphatic invasion present, 5/5 lymph nodes positive, additional medial margin: Poorly  differentiated carcinoma with lymphoid stroma, additional inferior margin: Positive, ER 10% weak, PR 0%, HER2 0, Ki-67 30% T1c N2a: Stage IIIc pathological staging   CT CAP 10/29/2022: Several prominent lymph nodes and enlarged nodes right axilla and right supraclavicular region many of these are nonpathologic by size criteria.  No distant metastatic disease. Bone scan 10/29/2022: No evidence of bone metastasis   11/08/2022 and 11/18/2022: Margin reexcision surgeries: Final margin focal positivity.  After much discussion the plan is to finish her systemic treatment and come back at a later point to do mastectomy.   Treatment summary: Adjuvant chemotherapy with dose dense Adriamycin and Cytoxan followed by Taxol to be completed 05/02/2023 Mastectomy by Dr. Dwain Sarna 05/03/2023:Right mastectomy: 8 cm grade 3 IDC with extensive lymphatic and vascular invasion RCB class III, deep margin multifocally positive, 5/5 lymph nodes positive, ER weak 10%, PR 0%, HER2 0, Ki-67 30%, total lymph nodes: 10/10 (prior lumpectomy she had 5 positive nodes) Adjuvant radiation to start 06/09/2023-07/21/23 ------------------------------------------------------------------------------------------------------------------------------------------------ CT CAP 05/22/2023: Similar to slightly decreased size of right axillary and subpectoral lymph nodes few prominent supraclavicular nodes increased or new (nonspecific versus metastatic), liver hypodensity indeterminate sclerosis left ninth rib fracture Bone scan 05/22/2023: Left ninth rib fracture otherwise negative MRI of the liver 06/12/23: Benign  Current treatment: Pembrolizumab maintenance every 3 weeks started 05/25/2023 with capecitabine Toxicities: Skin rash: Prescribed steroid taper if the rash persists.  I let her know that theoretically prednisone can interact with the pembrolizumab, and if she continues to need it we may need to discuss whether pembrolizumab is able to be  effective. Continue Capecitabine  She is experiencing intermittent dizziness.  I ordered an MRI of the brain to further evaluate.  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 Chest wall pain.  This could very well be secondary to the radiation that she just completed.  I will hold off on repeating CT imaging unless the pain worsens at this time.  She will call if the pain returns and we will order CT chest.  RTC in 3 weeks for labs, f/u and her next treatment.    All questions were answered. The patient knows to call the clinic with any problems, questions or concerns. We can certainly see the patient much sooner if necessary.  Total encounter time:30 minutes*in face-to-face visit time, chart review, lab review, care coordination, order entry, and documentation of the encounter time.    Lillard Anes, NP 07/27/23 9:08 AM Medical Oncology and Hematology [facility] [address] [place], [place] [address] Tel. [phone]    Fax. [phone]  *Total Encounter Time as defined by the Centers for Medicare and Medicaid Services includes, in addition to the face-to-face time of a patient visit (documented in the note above) non-face-to-face time: obtaining and reviewing outside history, ordering and reviewing medications, tests or procedures, care coordination (communications with other health care professionals or caregivers) and documentation in the medical record.

## 2023-07-27 NOTE — Patient Instructions (Signed)
[facility]  Discharge Instructions: Thank you for choosing [facility] to provide your oncology and hematology care.   If you have a lab appointment with the Cancer Center, please go directly to the Cancer Center and check in at the registration area.   Wear comfortable clothing and clothing appropriate for easy access to any Portacath or PICC line.   We strive to give you quality time with your provider. You may need to reschedule your appointment if you arrive late (15 or more minutes).  Arriving late affects you and other patients whose appointments are after yours.  Also, if you miss three or more appointments without notifying the office, you may be dismissed from the clinic at the provider's discretion.      For prescription refill requests, have your pharmacy contact our office and allow 72 hours for refills to be completed.    Today you received the following chemotherapy and/or immunotherapy agents :  Pembrolizumab      To help prevent nausea and vomiting after your treatment, we encourage you to take your nausea medication as directed.  BELOW ARE SYMPTOMS THAT SHOULD BE REPORTED IMMEDIATELY: *FEVER GREATER THAN 100.4 F (38 C) OR HIGHER *CHILLS OR SWEATING *NAUSEA AND VOMITING THAT IS NOT CONTROLLED WITH YOUR NAUSEA MEDICATION *UNUSUAL SHORTNESS OF BREATH *UNUSUAL BRUISING OR BLEEDING *URINARY PROBLEMS (pain or burning when urinating, or frequent urination) *BOWEL PROBLEMS (unusual diarrhea, constipation, pain near the anus) TENDERNESS IN MOUTH AND THROAT WITH OR WITHOUT PRESENCE OF ULCERS (sore throat, sores in mouth, or a toothache) UNUSUAL RASH, SWELLING OR PAIN  UNUSUAL VAGINAL DISCHARGE OR ITCHING   Items with * indicate a potential emergency and should be followed up as soon as possible or go to the Emergency Department if any problems should occur.  Please show the CHEMOTHERAPY ALERT CARD or IMMUNOTHERAPY ALERT CARD at  check-in to the Emergency Department and triage nurse.  Should you have questions after your visit or need to cancel or reschedule your appointment, please contact  [facility]  Dept: [phone]  and follow the prompts.  Office hours are 8:00 a.m. to 4:30 p.m. Monday - Friday. Please note that voicemails left after 4:00 p.m. may not be returned until the following business day.  We are closed weekends and major holidays. You have access to a nurse at all times for urgent questions. Please call the main number to the clinic Dept: [phone] and follow the prompts.   For any non-urgent questions, you may also contact your provider using MyChart. We now offer e-Visits for anyone 16 and older to request care online for non-urgent symptoms. For details visit [email or web address].   Also download the MyChart app! Go to the app store, search "MyChart", open the app, select , and log in with your MyChart username and password.

## 2023-07-28 ENCOUNTER — Other Ambulatory Visit: Payer: Self-pay

## 2023-07-28 ENCOUNTER — Ambulatory Visit: Payer: BC Managed Care – PPO

## 2023-07-28 ENCOUNTER — Other Ambulatory Visit (HOSPITAL_COMMUNITY): Payer: Self-pay

## 2023-07-28 LAB — T4: T4, Total: 9.9 ug/dL (ref 4.5–12.0)

## 2023-07-30 ENCOUNTER — Other Ambulatory Visit: Payer: Self-pay

## 2023-08-01 ENCOUNTER — Encounter: Payer: Self-pay | Admitting: Hematology and Oncology

## 2023-08-01 ENCOUNTER — Encounter: Payer: Self-pay | Admitting: Radiation Oncology

## 2023-08-01 ENCOUNTER — Other Ambulatory Visit: Payer: Self-pay

## 2023-08-01 ENCOUNTER — Other Ambulatory Visit (HOSPITAL_COMMUNITY): Payer: Self-pay

## 2023-08-01 NOTE — Progress Notes (Signed)
Specialty Pharmacy Ongoing Clinical Assessment Note  Caitlin [name] is a 67 y.o. female who is being followed by the specialty pharmacy service for RxSp Oncology   [name] specialty medication(s) reviewed today: Capecitabine   Missed doses in the last 4 weeks: 0   Patient/Caregiver did not have any additional questions or concerns.   Therapeutic benefit summary: Unable to assess   Adverse events/side effects summary: No adverse events/side effects   [name] therapy is appropriate to: Continue    Goals Addressed             This Visit's Progress    Stabilization of disease       Patient is on track. Patient will maintain adherence. Patient started Xeloda on Monday, 10/21 and has completed first week of therapy. Pt reports no issues or concerns. Next cycle starts on 08/15/23.          Follow up:  3 months  Bobette Mo Specialty Pharmacist

## 2023-08-01 NOTE — Progress Notes (Signed)
Specialty Pharmacy Refill Coordination Note  Caitlin [name] is a 67 y.o. female contacted today regarding refills of specialty medication(s) Capecitabine   Patient requested Delivery   Delivery date: 08/09/23   Verified address: [address]   Medication will be filled on 08/08/23.

## 2023-08-02 ENCOUNTER — Encounter: Payer: Self-pay | Admitting: Radiation Oncology

## 2023-08-02 ENCOUNTER — Ambulatory Visit (HOSPITAL_COMMUNITY)
Admission: RE | Admit: 2023-08-02 | Discharge: 2023-08-02 | Disposition: A | Discharge status: Home / Self Care | Payer: BC Managed Care – PPO | Source: Ambulatory Visit | Attending: Adult Health | Admitting: Adult Health

## 2023-08-02 ENCOUNTER — Ambulatory Visit
Admission: RE | Admit: 2023-08-02 | Discharge: 2023-08-02 | Disposition: A | Discharge status: Home / Self Care | Payer: BC Managed Care – PPO | Source: Ambulatory Visit | Attending: Radiation Oncology | Admitting: Radiation Oncology

## 2023-08-02 VITALS — BP 114/75 | HR 78 | Temp 97.3°F | Resp 20 | Ht 63.0 in | Wt 147.2 lb

## 2023-08-02 DIAGNOSIS — C50411 Malignant neoplasm of upper-outer quadrant of right female breast: Secondary | ICD-10-CM | POA: Diagnosis present

## 2023-08-02 DIAGNOSIS — Z170 Estrogen receptor positive status [ER+]: Secondary | ICD-10-CM

## 2023-08-02 DIAGNOSIS — R42 Dizziness and giddiness: Secondary | ICD-10-CM

## 2023-08-02 DIAGNOSIS — Z5112 Encounter for antineoplastic immunotherapy: Secondary | ICD-10-CM | POA: Diagnosis not present

## 2023-08-02 MED ORDER — GADOBUTROL 1 MMOL/ML IV SOLN
6.0000 mL | Freq: Once | INTRAVENOUS | Status: AC | PRN
  Administered 2023-08-02: 6 mL via INTRAVENOUS

## 2023-08-02 NOTE — Progress Notes (Signed)
Radiation Oncology         [phone] ________________________________  Name: Caitlin [name] MRN: [id number]  Date: 08/02/2023  DOB: Jun 22, 1956  End of Treatment Note  Diagnosis: Right Breast UOQ, Invasive ductal carcinoma with extensive lymphatic and vascular invasion, ER+ (weak) / PR- / Her2-, Grade 3 -- S/p: right lumpectomy with right axillary SLN excisions (nodal involvement and clean margins not achieved), followed by adjuvant chemotherapy, right mastectomy with right TAD, now on pembrolizumab maintenance and capecitabine due to extensive residual disease      Indication for treatment: Curative        Radiation treatment dates: 06/08/23 through 07/21/23   Site/dose:   1) Right chest wall (CW_R_BO ) - 26 Gy delivered in 13 Fx at 2 Gy/Fx 2) Right chest wall (CW_R_SCV_PAB) - 50 Gy delivered in 25 Fx at 2 Gy/Fx  3) Right chest wall boost- 14 Gy delivered in 7 Fx at 2 Gy/Fx 4) Right chest wall (CW_R) - 24 Gy delivered in 12 Fx at 2 Gy/Fx   Technique/Mode:  1,2,4) -  3D / Photon  3) - Electron   Beams/energy:  6X , 6X-FFF, 9E, 6E  Narrative: The patient tolerated radiation treatment relatively well without any significant side effects. During her final weekly treatment check on 10/15, the patient endorsed some tenderness, mild fatigue, and mild skin irritation. Physical exam performed on that same date showed radiation dermatitis in the upper inner aspect without any skin breakdown. Minimal erythema throughout the other areas of treatment were also noted.   Plan: The patient has completed radiation treatment. The patient will return to radiation oncology clinic for routine followup in one month. I advised them to call or return sooner if they have any questions or concerns related to their recovery or treatment.  -----------------------------------  Billie Lade, PhD, MD  This document serves as a record of services personally performed by Antony Blackbird, MD. It was created  on his behalf by Neena Rhymes, a trained medical scribe. The creation of this record is based on the scribe's personal observations and the provider's statements to them. This document has been checked and approved by the attending provider.

## 2023-08-02 NOTE — Progress Notes (Signed)
Radiation Oncology         [phone] ________________________________  Name: Caitlin [name] MRN: [id number]  Date: 08/02/2023  DOB: 01/24/1956  Follow-Up Visit Note  CC: [name], MD  [name], MD    ICD-10-CM   1. 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, ER+ (weak) / PR- / Her2-, Grade 3 -- S/p: right lumpectomy with right axillary SLN excisions (nodal involvement and clean margins not achieved), followed by adjuvant chemotherapy, right mastectomy with right TAD, now on pembrolizumab maintenance and capecitabine due to extensive residual disease      Interval Since Last Radiation: 12 days   Indication for treatment: Curative       Radiation treatment dates: 06/08/23 through 07/21/23  Site/dose:   1) Right chest wall (CW_R_BO ) - 26 Gy delivered in 13 Fx at 2 Gy/Fx 2) Right chest wall (CW_R_SCV_PAB) - 50 Gy delivered in 25 Fx at 2 Gy/Fx  3) Right chest wall boost- 14 Gy delivered in 7 Fx at 2 Gy/Fx 4) Right chest wall (CW_R) - 24 Gy delivered in 12 Fx at 2 Gy/Fx  Technique/Mode:  1,2,4) -  3D / Photon  3) - Electron  Beams/energy:  6X , 6X-FFF, 9E, 6E  Narrative:  The patient returns today for a skin check.    She has continued to receive therapy consisting of Pembrolizumab under the care of Dr. Pamelia Hoit. She continues to tolerate this moderately well other than a recent rash which required a steriod taper. Dr. Pamelia Hoit prescribed her another round of her steroid taper on 07/27/23 if the rash continues to persist.      Of note: during her recent visit with Dr. Pamelia Hoit on 07/27/23, the patient also reported having some right chest wall pain and intermittent dizziness. In the setting of her dizziness, she presented for an MRI of the brain today. Results are pending at this  time.      On evaluation today the patient reports her rash along the right chest area was improving nicely but then once she received her pembrolizumab her rash became much more significant with increased itching and discomfort.  She denies any chills or fever.  Allergies:  is allergic to penicillins.  Meds: Current Outpatient Medications  Medication Sig Dispense Refill   albuterol (VENTOLIN HFA) 108 (90 Base) MCG/ACT inhaler Inhale 2 puffs into the lungs every 4 (four) hours as needed for shortness of breath (Asthma).     aspirin 81 MG tablet Take 81 mg by mouth daily.       capecitabine (XELODA) 500 MG tablet Take 2 tablets (1,000 mg total) by mouth 2 (two) times daily after a meal. 14 days on and 7 days off 56 tablet 5   diclofenac Sodium (VOLTAREN) 1 % GEL Research Patient: Apply 0.5 grams(1 fingertip) to each hand and each foot twice daily for the duration of radiation therapy 200 g 0   diltiazem (CARDIZEM CD) 120 MG 24 hr capsule Take 120 mg by mouth daily.     levothyroxine (SYNTHROID) 75 MCG tablet Take 75 mcg by mouth daily before breakfast.     methylPREDNISolone (MEDROL DOSEPAK) 4 MG TBPK tablet Take 6 tabs for 3 days, 5 tabs for 3 days, 4 tabs for 3 days, 3 tabs for 3 days, 2 tabs for  3 days and 1 tab for 3 days 80 tablet 0   Multiple Vitamins-Minerals (MULTIVITAMIN WITH MINERALS) tablet Take 1 tablet by mouth daily.     OVER THE COUNTER MEDICATION Take 1 capsule by mouth 2 (two) times daily. copaiba softgels     prochlorperazine (COMPAZINE) 10 MG tablet TAKE 1 TABLET BY MOUTH EVERY 6 HOURS AS NEEDED FOR NAUSEA / FOR VOMITING 30 tablet 1   PULMICORT FLEXHALER 180 MCG/ACT inhaler Inhale 1 puff into the lungs daily.     triamcinolone cream (KENALOG) 0.1 % Apply 1 Application topically daily as needed (Dry skin).     acetaminophen (TYLENOL) 500 MG tablet Take 500 mg by mouth every 6 (six) hours as needed for moderate pain.     No current facility-administered medications for this  encounter.    Physical Findings: The patient is in no acute distress. Patient is alert and oriented.  height is 5\' 3"  (1.6 m) and weight is 147 lb 3.2 oz (66.8 kg). Her temperature is 97.3 F (36.3 C) (abnormal). Her blood pressure is 114/75 and her pulse is 78. Her respiration is 20 and oxygen saturation is 99%. .  No significant changes. Lungs are clear to auscultation bilaterally. Heart has regular rate and rhythm. No palpable cervical, supraclavicular, or axillary adenopathy. Abdomen soft, non-tender, normal bowel sounds.  The right chest wall area reveals a significant erythematous rash without any skin breakdown.  This is much more significant than when she finished her radiation treatment.  No signs of infection.  Lab Findings: Lab Results  Component Value Date   WBC 7.8 07/27/2023   HGB 11.9 (L) 07/27/2023   HCT 36.2 07/27/2023   MCV 98.6 07/27/2023   PLT 262 07/27/2023    Radiographic Findings: No results found.  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  She will discuss steroids with medical oncology.  I have given her Silvadene in the event that she develops skin breakdown.  Also discussed use of Neosporin plus if skin breakdown develops.  Plan: She is to keep her already scheduled 1 month follow-up appointment and will call if she develops more significant skin reaction.   ____________________________________  Billie Lade, PhD, MD  This document serves as a record of services personally performed by Antony Blackbird, MD. It was  created on his behalf by Neena Rhymes, a trained medical scribe. The creation of this record is based on the scribe's personal observations and the provider's statements to them. This document has been checked and approved by the attending provider.

## 2023-08-02 NOTE — Progress Notes (Signed)
Caitlin [name] is here today for follow up post radiation to the breast.   Breast Side:Right   They completed their radiation on: 07/21/2023  Does the patient complain of any of the following: Post radiation skin issues: She reports rash in the treatment area. Breast Tenderness: She reports no tenderness but that it is uncomfortable. Breast Swelling:  No Lymphadema: No Range of Motion limitations: No Fatigue post radiation: She reports getting her energy back slowly but surely. Appetite good/fair/poor: Good    BP 114/75 (BP Location: Left Arm, Patient Position: Sitting, Cuff Size: Normal)   Pulse 78   Temp (!) 97.3 F (36.3 C)   Resp 20   Ht 5\' 3"  (1.6 m)   Wt 147 lb 3.2 oz (66.8 kg)   SpO2 99%   BMI 26.08 kg/m

## 2023-08-07 ENCOUNTER — Other Ambulatory Visit: Payer: Self-pay

## 2023-08-08 ENCOUNTER — Other Ambulatory Visit: Payer: Self-pay

## 2023-08-08 ENCOUNTER — Encounter: Payer: Self-pay | Admitting: Hematology and Oncology

## 2023-08-11 ENCOUNTER — Other Ambulatory Visit: Payer: Self-pay

## 2023-08-17 ENCOUNTER — Telehealth: Payer: Self-pay

## 2023-08-17 ENCOUNTER — Inpatient Hospital Stay: Payer: BC Managed Care – PPO

## 2023-08-17 ENCOUNTER — Inpatient Hospital Stay (HOSPITAL_BASED_OUTPATIENT_CLINIC_OR_DEPARTMENT_OTHER): Payer: BC Managed Care – PPO | Admitting: Hematology and Oncology

## 2023-08-17 ENCOUNTER — Telehealth: Payer: Self-pay | Admitting: *Deleted

## 2023-08-17 ENCOUNTER — Inpatient Hospital Stay: Payer: BC Managed Care – PPO | Attending: Hematology and Oncology

## 2023-08-17 VITALS — BP 122/65 | HR 76 | Temp 97.3°F | Resp 18 | Ht 63.0 in | Wt 153.0 lb

## 2023-08-17 DIAGNOSIS — Z95828 Presence of other vascular implants and grafts: Secondary | ICD-10-CM

## 2023-08-17 DIAGNOSIS — Z170 Estrogen receptor positive status [ER+]: Secondary | ICD-10-CM | POA: Insufficient documentation

## 2023-08-17 DIAGNOSIS — C50411 Malignant neoplasm of upper-outer quadrant of right female breast: Secondary | ICD-10-CM | POA: Diagnosis present

## 2023-08-17 DIAGNOSIS — Z5112 Encounter for antineoplastic immunotherapy: Secondary | ICD-10-CM | POA: Insufficient documentation

## 2023-08-17 DIAGNOSIS — Z79899 Other long term (current) drug therapy: Secondary | ICD-10-CM | POA: Insufficient documentation

## 2023-08-17 LAB — CBC WITH DIFFERENTIAL (CANCER CENTER ONLY)
Abs Immature Granulocytes: 0.14 10*3/uL — ABNORMAL HIGH (ref 0.00–0.07)
Basophils Absolute: 0 10*3/uL (ref 0.0–0.1)
Basophils Relative: 1 %
Eosinophils Absolute: 0.1 10*3/uL (ref 0.0–0.5)
Eosinophils Relative: 1 %
HCT: 36.8 % (ref 36.0–46.0)
Hemoglobin: 12.3 g/dL (ref 12.0–15.0)
Immature Granulocytes: 2 %
Lymphocytes Relative: 10 %
Lymphs Abs: 0.7 10*3/uL (ref 0.7–4.0)
MCH: 34.5 pg — ABNORMAL HIGH (ref 26.0–34.0)
MCHC: 33.4 g/dL (ref 30.0–36.0)
MCV: 103.1 fL — ABNORMAL HIGH (ref 80.0–100.0)
Monocytes Absolute: 0.7 10*3/uL (ref 0.1–1.0)
Monocytes Relative: 11 %
Neutro Abs: 5.2 10*3/uL (ref 1.7–7.7)
Neutrophils Relative %: 75 %
Platelet Count: 238 10*3/uL (ref 150–400)
RBC: 3.57 MIL/uL — ABNORMAL LOW (ref 3.87–5.11)
RDW: 23.2 % — ABNORMAL HIGH (ref 11.5–15.5)
WBC Count: 6.8 10*3/uL (ref 4.0–10.5)
nRBC: 0 % (ref 0.0–0.2)

## 2023-08-17 LAB — CMP (CANCER CENTER ONLY)
ALT: 15 U/L (ref 0–44)
AST: 13 U/L — ABNORMAL LOW (ref 15–41)
Albumin: 3.9 g/dL (ref 3.5–5.0)
Alkaline Phosphatase: 66 U/L (ref 38–126)
Anion gap: 5 (ref 5–15)
BUN: 19 mg/dL (ref 8–23)
CO2: 28 mmol/L (ref 22–32)
Calcium: 9 mg/dL (ref 8.9–10.3)
Chloride: 105 mmol/L (ref 98–111)
Creatinine: 0.87 mg/dL (ref 0.44–1.00)
GFR, Estimated: >60 mL/min (ref >60)
Glucose, Bld: 90 mg/dL (ref 70–99)
Potassium: 4.3 mmol/L (ref 3.5–5.1)
Sodium: 138 mmol/L (ref 135–145)
Total Bilirubin: 0.6 mg/dL (ref <1.2)
Total Protein: 6.3 g/dL — ABNORMAL LOW (ref 6.5–8.1)

## 2023-08-17 MED ORDER — HEPARIN SOD (PORK) LOCK FLUSH 100 UNIT/ML IV SOLN
500.0000 [IU] | Freq: Once | INTRAVENOUS | Status: AC | PRN
  Administered 2023-08-17: 500 [IU]

## 2023-08-17 MED ORDER — SODIUM CHLORIDE 0.9% FLUSH
10.0000 mL | INTRAVENOUS | Status: DC | PRN
  Administered 2023-08-17: 10 mL

## 2023-08-17 MED ORDER — SODIUM CHLORIDE 0.9 % IV SOLN
200.0000 mg | Freq: Once | INTRAVENOUS | Status: AC
  Administered 2023-08-17: 200 mg via INTRAVENOUS
  Filled 2023-08-17: qty 200

## 2023-08-17 MED ORDER — METHYLPREDNISOLONE 4 MG PO TBPK: ORAL_TABLET | ORAL | 3 refills | Status: DC

## 2023-08-17 MED ORDER — SODIUM CHLORIDE 0.9% FLUSH
10.0000 mL | Freq: Once | INTRAVENOUS | Status: AC
  Administered 2023-08-17: 10 mL

## 2023-08-17 MED ORDER — SODIUM CHLORIDE 0.9 % IV SOLN: Freq: Once | INTRAVENOUS | Status: AC

## 2023-08-17 NOTE — Progress Notes (Signed)
Patient Care Team: [name], MD as PCP - General (Unknown Physician Specialty) Pershing Proud, RN as Oncology Nurse Navigator Donnelly Angelica, RN as Oncology Nurse Navigator [name], MD as Consulting Physician (Hematology and Oncology) Antony Blackbird, MD as Consulting Physician (Radiation Oncology) [name], MD as Consulting Physician (General Surgery)  DIAGNOSIS:  Encounter Diagnosis  Name Primary?   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female, estrogen receptor positive (HCC) Yes    SUMMARY OF ONCOLOGIC HISTORY: Oncology History  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female, estrogen receptor positive (HCC)  09/15/2022 Initial Diagnosis   Screening mammogram detected right breast mass which measured 1.4 cm by ultrasound at 10 o'clock position, biopsy revealed grade 3 IDC with lymphovascular invasion ER 10%, PR 0%, Ki-67 30%, HER2 0 negative   09/25/2022 Cancer Staging   Staging form: Breast, AJCC 8th Edition - Clinical: Stage IB (cT1c, cN0, cM0, G3, ER+, PR-, HER2-) - Signed by [name], MD on 09/25/2022 Histologic grading system: 3 grade system    Genetic Testing   [facility] CancerNext-Expanded Panel+RNA was Negative. Report date is 10/06/2021.  The CancerNext-Expanded gene panel offered by [facility] and includes sequencing, rearrangement, and RNA analysis for the following 77 genes: AIP, ALK, APC, ATM, AXIN2, BAP1, BARD1, BLM, BMPR1A, BRCA1, BRCA2, BRIP1, CDC73, CDH1, CDK4, CDKN1B, CDKN2A, CHEK2, CTNNA1, DICER1, FANCC, FH, FLCN, GALNT12, KIF1B, LZTR1, MAX, MEN1, MET, MLH1, MSH2, MSH3, MSH6, MUTYH, NBN, NF1, NF2, NTHL1, PALB2, PHOX2B, PMS2, POT1, PRKAR1A, PTCH1, PTEN, RAD51C, RAD51D, RB1, RECQL, RET, SDHA, SDHAF2, SDHB, SDHC, SDHD, SMAD4, SMARCA4, SMARCB1, SMARCE1, STK11, SUFU, TMEM127, TP53, TSC1, TSC2, VHL and XRCC2 (sequencing and deletion/duplication); EGFR, EGLN1, HOXB13, KIT, MITF, PDGFRA, POLD1, and  POLE (sequencing only); EPCAM and GREM1 (deletion/duplication only).    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 with high-grade DCIS, inferior margin positive, angiolymphatic invasion present, 5/5 lymph nodes positive, additional medial margin: Poorly differentiated carcinoma with lymphoid stroma, additional inferior margin: Positive, ER 10% weak, PR 0%, HER2 0, Ki-67 30%   10/21/2022 Cancer Staging   Staging form: Breast, AJCC 8th Edition - Pathologic: Stage IIIC (pT1c, pN2a, cM0, G3, ER-, PR-, HER2-) - Signed by [name], MD on 10/21/2022 Stage prefix: Initial diagnosis Histologic grading system: 3 grade system   11/08/2022 Surgery   Margin reexcision: Inferior margin: Grade 3 IDC, new margin positive Medial margin: Grade 3 IDC new margin widely involved   11/18/2022 Surgery   Right inferior margin reexcision: Invasive poorly differentiated adenocarcinoma grade 3 present in new inferior margin.  Medial margin reexcision: Invasive poorly differentiated adenocarcinoma grade 3 present at new medial margin, posterior margin: Poorly differentiated adenocarcinoma grade 3, margin negative  11/08/2022 and 11/18/2022: Final margin focal positivity. After much discussion the plan is to finish her systemic treatment and come back at a later point to do mastectomy.    12/02/2022 - 04/18/2023 Chemotherapy   Patient is on Treatment Plan : BREAST ADJUVANT DOSE DENSE AC q14d / PACLitaxel q7d     05/03/2023 Surgery   Right mastectomy: 8 cm grade 3 IDC with extensive lymphatic and vascular invasion RCB class III, deep margin multifocally positive, 5/5 lymph nodes positive, ER weak 10%, PR 0%, HER2 0, Ki-67 30%   05/25/2023 -  Chemotherapy   Patient is on Treatment Plan : BREAST Pembrolizumab (200) q21d x 24 months       CHIEF COMPLIANT: Follow-up  on Keytruda  HISTORY OF PRESENT ILLNESS: Caitlin [name] is a 67 year old  with history of triple negative breast cancer currently on immunotherapy with pembrolizumab.  She completed radiation therapy and subsequently received Keytruda with resulted in profound redness in the radiation field.  With the steroid therapy the redness has improved substantially.  She is tolerating capecitabine extremely well with some soft stools but no diarrhea. Previously she had vertigo and had a brain MRI which was negative.  Vertigo has resolved at this time.    ALLERGIES:  is allergic to penicillins.  MEDICATIONS:  Current Outpatient Medications  Medication Sig Dispense Refill   methylPREDNISolone (MEDROL DOSEPAK) 4 MG TBPK tablet Use as directed 21 tablet 3   acetaminophen (TYLENOL) 500 MG tablet Take 500 mg by mouth every 6 (six) hours as needed for moderate pain.     albuterol (VENTOLIN HFA) 108 (90 Base) MCG/ACT inhaler Inhale 2 puffs into the lungs every 4 (four) hours as needed for shortness of breath (Asthma).     aspirin 81 MG tablet Take 81 mg by mouth daily.       capecitabine (XELODA) 500 MG tablet Take 2 tablets (1,000 mg total) by mouth 2 (two) times daily after a meal. 14 days on and 7 days off 56 tablet 5   diclofenac Sodium (VOLTAREN) 1 % GEL Research Patient: Apply 0.5 grams(1 fingertip) to each hand and each foot twice daily for the duration of radiation therapy 200 g 0   diltiazem (CARDIZEM CD) 120 MG 24 hr capsule Take 120 mg by mouth daily.     levothyroxine (SYNTHROID) 75 MCG tablet Take 75 mcg by mouth daily before breakfast.     Multiple Vitamins-Minerals (MULTIVITAMIN WITH MINERALS) tablet Take 1 tablet by mouth daily.     OVER THE COUNTER MEDICATION Take 1 capsule by mouth 2 (two) times daily. copaiba softgels     prochlorperazine (COMPAZINE) 10 MG tablet TAKE 1 TABLET BY MOUTH EVERY 6 HOURS AS NEEDED FOR NAUSEA / FOR VOMITING 30 tablet 1   PULMICORT FLEXHALER 180 MCG/ACT inhaler Inhale 1 puff into the lungs daily.     triamcinolone cream (KENALOG) 0.1 %  Apply 1 Application topically daily as needed (Dry skin).     No current facility-administered medications for this visit.    PHYSICAL EXAMINATION: ECOG PERFORMANCE STATUS: 1 - Symptomatic but completely ambulatory  Vitals:   08/17/23 0850  BP: 122/65  Pulse: 76  Resp: 18  Temp: (!) 97.3 F (36.3 C)  SpO2: 99%   Filed Weights   08/17/23 0850  Weight: 153 lb (69.4 kg)      LABORATORY DATA:  I have reviewed the data as listed    Latest Ref Rng & Units 07/27/2023    8:07 AM 07/06/2023    7:40 AM 06/15/2023    8:19 AM  CMP  Glucose 70 - 99 mg/dL 85  93  161   BUN 8 - 23 mg/dL 19  19  21    Creatinine 0.44 - 1.00 mg/dL 0.96  0.45  4.09   Sodium 135 - 145 mmol/L 138  139  138   Potassium 3.5 - 5.1 mmol/L 3.8  3.9  4.0   Chloride 98 - 111 mmol/L 105  105  106   CO2 22 - 32 mmol/L 27  27  25    Calcium 8.9 - 10.3 mg/dL 9.1  9.4  9.3   Total Protein 6.5 - 8.1 g/dL 6.5  6.8  7.1   Total Bilirubin 0.3 -  1.2 mg/dL 0.5  0.4  0.5   Alkaline Phos 38 - 126 U/L 70  81  113   AST 15 - 41 U/L 12  11  39   ALT 0 - 44 U/L 12  16  79     Lab Results  Component Value Date   WBC 6.8 08/17/2023   HGB 12.3 08/17/2023   HCT 36.8 08/17/2023   MCV 103.1 (H) 08/17/2023   PLT 238 08/17/2023   NEUTROABS 5.2 08/17/2023    ASSESSMENT & PLAN:  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female, estrogen receptor positive (HCC) 10/14/2022:Right lumpectomy: Grade 3 invasive poorly differentiated ductal adenocarcinoma 1.4 cm with focal lobular features with high-grade DCIS, inferior margin positive, angiolymphatic invasion present, 5/5 lymph nodes positive, additional medial margin: Poorly differentiated carcinoma with lymphoid stroma, additional inferior margin: Positive, ER 10% weak, PR 0%, HER2 0, Ki-67 30% T1c N2a: Stage IIIc pathological staging   CT CAP 10/29/2022: Several prominent lymph nodes and enlarged nodes right axilla and right supraclavicular region many of these are  nonpathologic by size criteria.  No distant metastatic disease. Bone scan 10/29/2022: No evidence of bone metastasis   11/08/2022 and 11/18/2022: Margin reexcision surgeries: Final margin focal positivity.  After much discussion the plan is to finish her systemic treatment and come back at a later point to do mastectomy.   Treatment summary: Adjuvant chemotherapy with dose dense Adriamycin and Cytoxan followed by Taxol to be completed 05/02/2023 Mastectomy by Dr. Dwain Sarna 05/03/2023:Right mastectomy: 8 cm grade 3 IDC with extensive lymphatic and vascular invasion RCB class III, deep margin multifocally positive, 5/5 lymph nodes positive, ER weak 10%, PR 0%, HER2 0, Ki-67 30%, total lymph nodes: 10/10 (prior lumpectomy she had 5 positive nodes) Adjuvant radiation to start 06/09/2023-07/21/23 ------------------------------------------------------------------------------------------------------------------------------------------------ CT CAP 05/22/2023: Similar to slightly decreased size of right axillary and subpectoral lymph nodes few prominent supraclavicular nodes increased or new (nonspecific versus metastatic), liver hypodensity indeterminate sclerosis left ninth rib fracture Bone scan 05/22/2023: Left ninth rib fracture otherwise negative MRI of the liver 06/12/23: Benign MRI brain 08/02/2023: Benign   Current treatment: Pembrolizumab maintenance every 3 weeks started 05/25/2023 with capecitabine Toxicities: Skin rash: Responded to steroids but required prolonged steroid taper.   I discussed with her that if she were to require steroids with each and every treatment then we may have to discuss stopping pembrolizumab.  We will reassess this question with the next treatment.    Orders Placed This Encounter  Procedures   Thyroid Panel With TSH    Standing Status:   Future    Standing Expiration Date:   08/16/2024   The patient has a good understanding of the overall plan. she agrees with it. she  will call with any problems that may develop before the next visit here. Total time spent: 30 mins including face to face time and time spent for planning, charting and co-ordination of care   Tamsen Meek, MD 08/17/23

## 2023-08-17 NOTE — Patient Instructions (Signed)
[facility] - A DEPT OF [facility]  Discharge Instructions: Thank you for choosing [facility] to provide your oncology and hematology care.   If you have a lab appointment with the Cancer Center, please go directly to the Cancer Center and check in at the registration area.   Wear comfortable clothing and clothing appropriate for easy access to any Portacath or PICC line.   We strive to give you quality time with your provider. You may need to reschedule your appointment if you arrive late (15 or more minutes).  Arriving late affects you and other patients whose appointments are after yours.  Also, if you miss three or more appointments without notifying the office, you may be dismissed from the clinic at the provider's discretion.      For prescription refill requests, have your pharmacy contact our office and allow 72 hours for refills to be completed.    Today you received the following chemotherapy and/or immunotherapy agents: Pembrolizumab      To help prevent nausea and vomiting after your treatment, we encourage you to take your nausea medication as directed.  BELOW ARE SYMPTOMS THAT SHOULD BE REPORTED IMMEDIATELY: *FEVER GREATER THAN 100.4 F (38 C) OR HIGHER *CHILLS OR SWEATING *NAUSEA AND VOMITING THAT IS NOT CONTROLLED WITH YOUR NAUSEA MEDICATION *UNUSUAL SHORTNESS OF BREATH *UNUSUAL BRUISING OR BLEEDING *URINARY PROBLEMS (pain or burning when urinating, or frequent urination) *BOWEL PROBLEMS (unusual diarrhea, constipation, pain near the anus) TENDERNESS IN MOUTH AND THROAT WITH OR WITHOUT PRESENCE OF ULCERS (sore throat, sores in mouth, or a toothache) UNUSUAL RASH, SWELLING OR PAIN  UNUSUAL VAGINAL DISCHARGE OR ITCHING   Items with * indicate a potential emergency and should be followed up as soon as possible or go to the Emergency Department if any problems should occur.  Please show the CHEMOTHERAPY ALERT CARD or  IMMUNOTHERAPY ALERT CARD at check-in to the Emergency Department and triage nurse.  Should you have questions after your visit or need to cancel or reschedule your appointment, please contact [facility] - A DEPT OF [facility]  Dept: [phone]  and follow the prompts.  Office hours are 8:00 a.m. to 4:30 p.m. Monday - Friday. Please note that voicemails left after 4:00 p.m. may not be returned until the following business day.  We are closed weekends and major holidays. You have access to a nurse at all times for urgent questions. Please call the main number to the clinic Dept: [phone] and follow the prompts.   For any non-urgent questions, you may also contact your provider using MyChart. We now offer e-Visits for anyone 64 and older to request care online for non-urgent symptoms. For details visit [email or web address].   Also download the MyChart app! Go to the app store, search "MyChart", open the app, select [name], and log in with your MyChart username and password.

## 2023-08-17 NOTE — Assessment & Plan Note (Addendum)
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  No distant metastatic disease. Bone scan 10/29/2022: No evidence of bone metastasis   11/08/2022 and 11/18/2022: Margin reexcision surgeries: Final margin focal positivity.  After much discussion the plan is to finish her systemic treatment and come back at a later point to do mastectomy.   Treatment summary: Adjuvant chemotherapy with dose dense Adriamycin and Cytoxan followed by Taxol to be completed 05/02/2023 Mastectomy by Dr. Dwain Sarna 05/03/2023:Right mastectomy: 8 cm grade 3 IDC with extensive lymphatic and vascular invasion RCB class III, deep margin multifocally positive, 5/5 lymph nodes positive, ER weak 10%, PR 0%, HER2 0, Ki-67 30%, total lymph nodes: 10/10 (prior lumpectomy she had 5 positive nodes) Adjuvant radiation to start 06/09/2023-07/21/23 ------------------------------------------------------------------------------------------------------------------------------------------------ CT CAP 05/22/2023: Similar to slightly decreased size of right axillary and subpectoral lymph nodes few prominent supraclavicular nodes increased or new (nonspecific versus metastatic), liver hypodensity indeterminate sclerosis left ninth rib fracture Bone scan 05/22/2023: Left ninth rib fracture otherwise negative MRI of the liver 06/12/23: Benign MRI brain 08/02/2023: Benign   Current treatment: Pembrolizumab maintenance every 3  weeks started 05/25/2023 with capecitabine Toxicities: Skin rash: Responded to steroids but required prolonged steroid taper.   I discussed with her that if she were to require steroids with each and every treatment then we may have to discuss stopping pembrolizumab.  We will reassess this question with the next treatment.

## 2023-08-17 NOTE — Telephone Encounter (Signed)
Called patient to ask about rescheduling fu appt. on 08-22-23 due to Dr. Roselind Messier being in the OR, lvm to call back to reschedule this appt.

## 2023-08-17 NOTE — Telephone Encounter (Addendum)
Called pt per Np with message below.----- Message from Noreene Filbert sent at 08/17/2023 10:34 AM EST ----- MRI negative for malignancy please let patient know. ----- Message ----- From: Interface, Rad Results In Sent: 08/17/2023   8:03 AM EST To: Loa Socks, NP

## 2023-08-18 ENCOUNTER — Other Ambulatory Visit: Payer: Self-pay

## 2023-08-22 ENCOUNTER — Ambulatory Visit: Payer: BC Managed Care – PPO | Admitting: Radiation Oncology

## 2023-08-25 ENCOUNTER — Other Ambulatory Visit: Payer: Self-pay

## 2023-08-25 NOTE — Progress Notes (Signed)
Specialty Pharmacy Refill Coordination Note  Caitlin [name] is a 67 y.o. female contacted today regarding refills of specialty medication(s) Capecitabine   Patient requested Delivery   Delivery date: 08/30/23   Verified address: [address] [place] [address]   Medication will be filled on 08/29/23.

## 2023-08-29 ENCOUNTER — Other Ambulatory Visit: Payer: Self-pay

## 2023-09-01 ENCOUNTER — Encounter: Payer: Self-pay | Admitting: Hematology and Oncology

## 2023-09-08 ENCOUNTER — Other Ambulatory Visit: Payer: Self-pay

## 2023-09-08 ENCOUNTER — Other Ambulatory Visit (HOSPITAL_COMMUNITY): Payer: Self-pay

## 2023-09-08 ENCOUNTER — Encounter: Payer: Self-pay | Admitting: Adult Health

## 2023-09-08 ENCOUNTER — Inpatient Hospital Stay: Payer: BC Managed Care – PPO | Admitting: Adult Health

## 2023-09-08 ENCOUNTER — Inpatient Hospital Stay: Payer: BC Managed Care – PPO | Attending: Hematology and Oncology

## 2023-09-08 ENCOUNTER — Inpatient Hospital Stay: Payer: BC Managed Care – PPO

## 2023-09-08 VITALS — BP 119/69 | HR 78 | Temp 97.9°F | Resp 16 | Wt 156.1 lb

## 2023-09-08 DIAGNOSIS — Z8042 Family history of malignant neoplasm of prostate: Secondary | ICD-10-CM | POA: Diagnosis not present

## 2023-09-08 DIAGNOSIS — C50411 Malignant neoplasm of upper-outer quadrant of right female breast: Secondary | ICD-10-CM | POA: Diagnosis not present

## 2023-09-08 DIAGNOSIS — Z9011 Acquired absence of right breast and nipple: Secondary | ICD-10-CM | POA: Insufficient documentation

## 2023-09-08 DIAGNOSIS — Z9221 Personal history of antineoplastic chemotherapy: Secondary | ICD-10-CM | POA: Diagnosis not present

## 2023-09-08 DIAGNOSIS — R21 Rash and other nonspecific skin eruption: Secondary | ICD-10-CM | POA: Insufficient documentation

## 2023-09-08 DIAGNOSIS — Z95828 Presence of other vascular implants and grafts: Secondary | ICD-10-CM

## 2023-09-08 DIAGNOSIS — Z170 Estrogen receptor positive status [ER+]: Secondary | ICD-10-CM

## 2023-09-08 DIAGNOSIS — Z923 Personal history of irradiation: Secondary | ICD-10-CM | POA: Diagnosis not present

## 2023-09-08 LAB — CBC WITH DIFFERENTIAL (CANCER CENTER ONLY)
Abs Immature Granulocytes: 0.19 10*3/uL — ABNORMAL HIGH (ref 0.00–0.07)
Basophils Absolute: 0.1 10*3/uL (ref 0.0–0.1)
Basophils Relative: 0 %
Eosinophils Absolute: 0 10*3/uL (ref 0.0–0.5)
Eosinophils Relative: 0 %
HCT: 37.5 % (ref 36.0–46.0)
Hemoglobin: 12.7 g/dL (ref 12.0–15.0)
Immature Granulocytes: 2 %
Lymphocytes Relative: 7 %
Lymphs Abs: 0.8 10*3/uL (ref 0.7–4.0)
MCH: 35.5 pg — ABNORMAL HIGH (ref 26.0–34.0)
MCHC: 33.9 g/dL (ref 30.0–36.0)
MCV: 104.7 fL — ABNORMAL HIGH (ref 80.0–100.0)
Monocytes Absolute: 0.6 10*3/uL (ref 0.1–1.0)
Monocytes Relative: 5 %
Neutro Abs: 10.2 10*3/uL — ABNORMAL HIGH (ref 1.7–7.7)
Neutrophils Relative %: 86 %
Platelet Count: 283 10*3/uL (ref 150–400)
RBC: 3.58 MIL/uL — ABNORMAL LOW (ref 3.87–5.11)
RDW: 22.1 % — ABNORMAL HIGH (ref 11.5–15.5)
WBC Count: 11.9 10*3/uL — ABNORMAL HIGH (ref 4.0–10.5)
nRBC: 0 % (ref 0.0–0.2)

## 2023-09-08 LAB — CMP (CANCER CENTER ONLY)
ALT: 16 U/L (ref 0–44)
AST: 13 U/L — ABNORMAL LOW (ref 15–41)
Albumin: 4.2 g/dL (ref 3.5–5.0)
Alkaline Phosphatase: 81 U/L (ref 38–126)
Anion gap: 7 (ref 5–15)
BUN: 21 mg/dL (ref 8–23)
CO2: 28 mmol/L (ref 22–32)
Calcium: 9.7 mg/dL (ref 8.9–10.3)
Chloride: 104 mmol/L (ref 98–111)
Creatinine: 0.82 mg/dL (ref 0.44–1.00)
GFR, Estimated: >60 mL/min (ref >60)
Glucose, Bld: 95 mg/dL (ref 70–99)
Potassium: 4.2 mmol/L (ref 3.5–5.1)
Sodium: 139 mmol/L (ref 135–145)
Total Bilirubin: 0.5 mg/dL (ref <1.2)
Total Protein: 6.5 g/dL (ref 6.5–8.1)

## 2023-09-08 MED ORDER — SODIUM CHLORIDE 0.9% FLUSH
10.0000 mL | Freq: Once | INTRAVENOUS | Status: AC
  Administered 2023-09-08: 10 mL

## 2023-09-08 NOTE — Progress Notes (Signed)
[facility] Cancer Follow up:    Caitlin Chimera, MD [address] [place] [address]   DIAGNOSIS:  Cancer Staging  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female, estrogen receptor positive (HCC) Staging form: Breast, AJCC 8th Edition - Clinical: Stage IB (cT1c, cN0, cM0, G3, ER+, PR-, HER2-) - Signed by [name], MD on 09/25/2022 Histologic grading system: 3 grade system - Pathologic: Stage IIIC (pT1c, pN2a, cM0, G3, ER-, PR-, HER2-) - Signed by [name], MD on 10/21/2022 Stage prefix: Initial diagnosis Histologic grading system: 3 grade system   SUMMARY OF ONCOLOGIC HISTORY: Oncology History  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female, estrogen receptor positive (HCC)  09/15/2022 Initial Diagnosis   Screening mammogram detected right breast mass which measured 1.4 cm by ultrasound at 10 o'clock position, biopsy revealed grade 3 IDC with lymphovascular invasion ER 10%, PR 0%, Ki-67 30%, HER2 0 negative   09/25/2022 Cancer Staging   Staging form: Breast, AJCC 8th Edition - Clinical: Stage IB (cT1c, cN0, cM0, G3, ER+, PR-, HER2-) - Signed by [name], MD on 09/25/2022 Histologic grading system: 3 grade system    Genetic Testing   [facility] CancerNext-Expanded Panel+RNA was Negative. Report date is 10/06/2021.  The CancerNext-Expanded gene panel offered by [facility] and includes sequencing, rearrangement, and RNA analysis for the following 77 genes: AIP, ALK, APC, ATM, AXIN2, BAP1, BARD1, BLM, BMPR1A, BRCA1, BRCA2, BRIP1, CDC73, CDH1, CDK4, CDKN1B, CDKN2A, CHEK2, CTNNA1, DICER1, FANCC, FH, FLCN, GALNT12, KIF1B, LZTR1, MAX, MEN1, MET, MLH1, MSH2, MSH3, MSH6, MUTYH, NBN, NF1, NF2, NTHL1, PALB2, PHOX2B, PMS2, POT1, PRKAR1A, PTCH1, PTEN, RAD51C, RAD51D, RB1, RECQL, RET, SDHA, SDHAF2, SDHB, SDHC, SDHD, SMAD4, SMARCA4, SMARCB1, SMARCE1, STK11, SUFU, TMEM127, TP53, TSC1, TSC2, VHL and XRCC2 (sequencing and  deletion/duplication); EGFR, EGLN1, HOXB13, KIT, MITF, PDGFRA, POLD1, and POLE (sequencing only); EPCAM and GREM1 (deletion/duplication only).    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 with high-grade DCIS, inferior margin positive, angiolymphatic invasion present, 5/5 lymph nodes positive, additional medial margin: Poorly differentiated carcinoma with lymphoid stroma, additional inferior margin: Positive, ER 10% weak, PR 0%, HER2 0, Ki-67 30%   10/21/2022 Cancer Staging   Staging form: Breast, AJCC 8th Edition - Pathologic: Stage IIIC (pT1c, pN2a, cM0, G3, ER-, PR-, HER2-) - Signed by [name], MD on 10/21/2022 Stage prefix: Initial diagnosis Histologic grading system: 3 grade system   11/08/2022 Surgery   Margin reexcision: Inferior margin: Grade 3 IDC, new margin positive Medial margin: Grade 3 IDC new margin widely involved   11/18/2022 Surgery   Right inferior margin reexcision: Invasive poorly differentiated adenocarcinoma grade 3 present in new inferior margin.  Medial margin reexcision: Invasive poorly differentiated adenocarcinoma grade 3 present at new medial margin, posterior margin: Poorly differentiated adenocarcinoma grade 3, margin negative  11/08/2022 and 11/18/2022: Final margin focal positivity. After much discussion the plan is to finish her systemic treatment and come back at a later point to do mastectomy.    12/02/2022 - 04/18/2023 Chemotherapy   Patient is on Treatment Plan : BREAST ADJUVANT DOSE DENSE AC q14d / PACLitaxel q7d     05/03/2023 Surgery   Right mastectomy: 8 cm grade 3 IDC with extensive lymphatic and vascular invasion RCB class III, deep margin multifocally positive, 5/5 lymph nodes positive, ER weak 10%, PR 0%, HER2 0, Ki-67 30%   05/25/2023 - 08/17/2023 Chemotherapy   Patient is  on Treatment Plan : BREAST Pembrolizumab (200) q21d x 24 months     05/25/2023 -   Adjuvant Chemotherapy   Capectiabine 1000mg  po BID 2 weeks on, 1 week off; began 05/25/2023, second cycle delayed to 07/06/2023   06/08/2023 - 07/21/2023 Radiation Therapy   First Treatment Date: 2023-06-08 - Last Treatment Date: 2023-07-21   Plan Name: CW_R_BO Site: Chest Wall, Right Technique: 3D Mode: Photon Dose Per Fraction: 2 Gy Prescribed Dose (Delivered / Prescribed): 26 Gy / 26 Gy Prescribed Fxs (Delivered / Prescribed): 13 / 13   Plan Name: CW_R_SCV_PAB Site: Chest Wall, Right Technique: 3D Mode: Photon Dose Per Fraction: 2 Gy Prescribed Dose (Delivered / Prescribed): 50 Gy / 50 Gy Prescribed Fxs (Delivered / Prescribed): 25 / 25   Plan Name: CW_R_Bst_BO Site: Chest Wall, Right Technique: Electron Mode: Electron Dose Per Fraction: 2 Gy Prescribed Dose (Delivered / Prescribed): 14 Gy / 14 Gy Prescribed Fxs (Delivered / Prescribed): 7 / 7   Plan Name: CW_R Site: Chest Wall, Right Technique: 3D Mode: Photon Dose Per Fraction: 2 Gy Prescribed Dose (Delivered / Prescribed): 24 Gy / 24 Gy Prescribed Fxs (Delivered / Prescribed): 12 / 12     CURRENT THERAPY: Pembrolizumab; Capecitabine  INTERVAL HISTORY: Caitlin [name] 67 y.o. female returns for follow-up prior to receiving Keytruda.  She has had persistent rashes after every cycle of Keytruda.  She met with Dr. Pamelia Hoit at her last appointment who noted if she had another rash after her most recent cycle she would likely need to discontinue therapy.  [name] notes that after her most recent Keytruda she did experience increased rash requiring steroid taper.  She has continued on Capecitabine 2 weeks on and 1 week off however with no issues.   Patient Active Problem List   Diagnosis Date Noted   S/P mastectomy, right 05/03/2023   Port-A-Cath in place 12/16/2022   Genetic testing 10/11/2022   Appendicitis with peritonitis 09/29/2022   Family history of prostate cancer 09/29/2022   Malignant neoplasm of upper-outer  quadrant of right breast in female, estrogen receptor positive (HCC) 09/22/2022   Atrial fibrillation with RVR (HCC) 11/30/2010   Chronic anticoagulation 11/30/2010   Hypothyroidism 11/30/2010   PALPITATIONS 11/27/2010    is allergic to penicillins.  MEDICAL HISTORY: Past Medical History:  Diagnosis Date   Asthma    Complication of anesthesia    Dyspnea    Dysrhythmia    2012   Headache    History of kidney stones    passed   Hypothyroidism    Pneumonia 01/26/2023   PONV (postoperative nausea and vomiting)    rt breast ca 08/2022   Right Breast   Scoliosis    Thyroid disease     SURGICAL HISTORY: Past Surgical History:  Procedure Laterality Date   APPENDECTOMY     AXILLARY SENTINEL NODE BIOPSY Right 10/14/2022   Procedure: RIGHT AXILLARY SENTINEL NODE BIOPSY;  Surgeon: [name], MD;  Location: [facility] OR;  Service: General;  Laterality: Right;   BREAST BIOPSY Right 09/15/2022   [place] RT BREAST BX W LOC DEV 1ST LESION IMG BX SPEC US GUIDE 09/15/2022 [facility]   BREAST BIOPSY  10/12/2022   [place] RT RADIOACTIVE SEED LOC 10/12/2022 [facility]AMMOGR[facility]   BREAST LUMPECTOMY WITH RADIOACTIVE SEED AND SENTINEL LYMPH NODE BIOPSY Right 10/14/2022   Procedure: RIGHT BREAST LUMPECTOMY WITH RADIOACTIVE SEED;  Surgeon: [name], MD;  Location: [facility] OR;  Service: General;  Laterality: Right;   COLONOSCOPY  LAPAROSCOPIC APPENDECTOMY     PORT-A-CATH REMOVAL Left 05/03/2023   Procedure: REMOVAL PORT-A-CATH;  Surgeon: [name], MD;  Location: [facility] OR;  Service: General;  Laterality: Left;   PORTACATH PLACEMENT Left 10/14/2022   Procedure: INSERTION PORT-A-CATH;  Surgeon: [name], MD;  Location: [facility] OR;  Service: General;  Laterality: Left;   PORTACATH PLACEMENT Left 05/19/2023   Procedure: PORT PLACEMENT WITH ULTRASOUND GUIDANCE;  Surgeon: [name], MD;  Location: [facility];  Service: General;  Laterality: Left;   RE-EXCISION OF  BREAST LUMPECTOMY Right 11/08/2022   Procedure: RE-EXCISION OF RIGHT BREAST LUMPECTOMY;  Surgeon: [name], MD;  Location: [facility];  Service: General;  Laterality: Right;   RE-EXCISION OF BREAST LUMPECTOMY Right 11/18/2022   Procedure: RE-EXCISION OF RIGHT BREAST LUMPECTOMY;  Surgeon: [name], MD;  Location: [facility] OR;  Service: General;  Laterality: Right;   SIMPLE MASTECTOMY WITH AXILLARY SENTINEL NODE BIOPSY Right 05/03/2023   Procedure: RIGHT MASTECTOMY;  Surgeon: [name], MD;  Location: [facility] OR;  Service: General;  Laterality: Right;  GEN & PEC BLOCK   WISDOM TOOTH EXTRACTION      SOCIAL HISTORY: Social History   Socioeconomic History   Marital status: Married    Spouse name: Not on file   Number of children: Not on file   Years of education: Not on file   Highest education level: Not on file  Occupational History   Not on file  Tobacco Use   Smoking status: Never   Smokeless tobacco: Not on file  Vaping Use   Vaping status: Never Used  Substance and Sexual Activity   Alcohol use: Not Currently   Drug use: Never   Sexual activity: Not Currently  Other Topics Concern   Not on file  Social History Narrative   Not on file   Social Determinants of Health   Financial Resource Strain: Not on file  Food Insecurity: No Food Insecurity (05/27/2023)   Hunger Vital Sign    Worried About Running Out of Food in the Last Year: Never true    Ran Out of Food in the Last Year: Never true  Transportation Needs: No Transportation Needs (05/27/2023)   PRAPARE - Transportation    Lack of Transportation (Medical): No    Lack of Transportation (Non-Medical): No  Physical Activity: Sufficiently Active (06/19/2020)   Received [facility]   Exercise Vital Sign    Days of Exercise per Week: 7 days    Minutes of Exercise per Session: 30 min  Stress: Not on file  Social Connections: Not on file  Intimate Partner Violence:  Not At Risk (05/27/2023)   Humiliation, Afraid, Rape, and Kick questionnaire    Fear of Current or Ex-Partner: No    Emotionally Abused: No    Physically Abused: No    Sexually Abused: No    FAMILY HISTORY: Family History  Problem Relation Age of Onset   Thyroid disease Mother    Prostate cancer Father 40       metastatic   Hypertension Father    Thyroid disease Sister        twin sister age 31   Asthma Sister    Prostate cancer Brother 26   Heart attack Maternal Grandmother        died age 54's   Heart attack Maternal Grandfather        died age 73's   Clotting disorder Paternal Grandmother  cerebral hemorrage    Review of Systems  Constitutional:  Negative for appetite change, chills, fatigue, fever and unexpected weight change.  HENT:   Negative for hearing loss, lump/mass and trouble swallowing.   Eyes:  Negative for eye problems and icterus.  Respiratory:  Negative for chest tightness, cough and shortness of breath.   Cardiovascular:  Negative for chest pain, leg swelling and palpitations.  Gastrointestinal:  Negative for abdominal distention, abdominal pain, constipation, diarrhea, nausea and vomiting.  Endocrine: Negative for hot flashes.  Genitourinary:  Negative for difficulty urinating.   Musculoskeletal:  Negative for arthralgias.  Skin:  Negative for itching and rash.  Neurological:  Negative for dizziness, extremity weakness, headaches and numbness.  Hematological:  Negative for adenopathy. Does not bruise/bleed easily.  Psychiatric/Behavioral:  Negative for depression. The patient is not nervous/anxious.       PHYSICAL EXAMINATION   Onc Performance Status - 09/08/23 0900       KPS SCALE   KPS % SCORE Normal, no compliants, no evidence of disease             Vitals:   09/08/23 0937  BP: 119/69  Pulse: 78  Resp: 16  Temp: 97.9 F (36.6 C)  SpO2: 98%    Physical Exam Constitutional:      General: She is not in acute distress.     Appearance: Normal appearance. She is not toxic-appearing.  HENT:     Head: Normocephalic and atraumatic.     Mouth/Throat:     Mouth: Mucous membranes are moist.     Pharynx: Oropharynx is clear. No oropharyngeal exudate or posterior oropharyngeal erythema.  Eyes:     General: No scleral icterus. Cardiovascular:     Rate and Rhythm: Normal rate and regular rhythm.     Pulses: Normal pulses.     Heart sounds: Normal heart sounds.  Pulmonary:     Effort: Pulmonary effort is normal.     Breath sounds: Normal breath sounds.  Abdominal:     General: Abdomen is flat. Bowel sounds are normal. There is no distension.     Palpations: Abdomen is soft.     Tenderness: There is no abdominal tenderness.  Musculoskeletal:        General: No swelling.     Cervical back: Neck supple.  Lymphadenopathy:     Cervical: No cervical adenopathy.  Skin:    General: Skin is warm and dry.     Findings: Rash present.     Comments: erythematous macular rash noted on left arm.  This rash is resolving.  Neurological:     General: No focal deficit present.     Mental Status: She is alert.  Psychiatric:        Mood and Affect: Mood normal.        Behavior: Behavior normal.     LABORATORY DATA:  CBC    Component Value Date/Time   WBC 11.9 (H) 09/08/2023 0856   WBC 11.1 (H) 10/13/2022 0930   RBC 3.58 (L) 09/08/2023 0856   HGB 12.7 09/08/2023 0856   HCT 37.5 09/08/2023 0856   PLT 283 09/08/2023 0856   MCV 104.7 (H) 09/08/2023 0856   MCH 35.5 (H) 09/08/2023 0856   MCHC 33.9 09/08/2023 0856   RDW 22.1 (H) 09/08/2023 0856   LYMPHSABS 0.8 09/08/2023 0856   MONOABS 0.6 09/08/2023 0856   EOSABS 0.0 09/08/2023 0856   BASOSABS 0.1 09/08/2023 0856    CMP     Component Value Date/Time  NA 139 09/08/2023 0856   K 4.2 09/08/2023 0856   CL 104 09/08/2023 0856   CO2 28 09/08/2023 0856   GLUCOSE 95 09/08/2023 0856   BUN 21 09/08/2023 0856   CREATININE 0.82 09/08/2023 0856   CALCIUM 9.7 09/08/2023  0856   PROT 6.5 09/08/2023 0856   ALBUMIN 4.2 09/08/2023 0856   AST 13 (L) 09/08/2023 0856   ALT 16 09/08/2023 0856   ALKPHOS 81 09/08/2023 0856   BILITOT 0.5 09/08/2023 0856   GFRNONAA >60 09/08/2023 0856        ASSESSMENT and THERAPY PLAN:   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female, estrogen receptor positive (HCC) 10/14/2022:Right lumpectomy: 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  No distant metastatic disease. Bone scan 10/29/2022: No evidence of bone metastasis   11/08/2022 and 11/18/2022: Margin reexcision surgeries: Final margin focal positivity.  After much discussion the plan is to finish her systemic treatment and come back at a later point to do mastectomy.   Treatment summary: Adjuvant chemotherapy with dose dense Adriamycin and Cytoxan followed by Taxol to be completed 05/02/2023 Mastectomy by Dr. Dwain Sarna 05/03/2023:Right mastectomy: 8 cm grade 3 IDC with extensive lymphatic and vascular invasion RCB class III, deep margin multifocally positive, 5/5 lymph nodes positive, ER weak 10%, PR 0%, HER2 0, Ki-67 30%, total lymph nodes: 10/10 (prior lumpectomy she had 5 positive nodes) Adjuvant radiation to start 06/09/2023-07/21/23 ------------------------------------------------------------------------------------------------------------------------------------------------ CT CAP 05/22/2023: Similar to slightly decreased size of right axillary  and subpectoral lymph nodes few prominent supraclavicular nodes increased or new (nonspecific versus metastatic), liver hypodensity indeterminate sclerosis left ninth rib fracture Bone scan 05/22/2023: Left ninth rib fracture otherwise negative MRI of the liver 06/12/23: Benign MRI brain 08/02/2023: Benign   Current treatment: Pembrolizumab maintenance every 3 weeks started 05/25/2023 with capecitabine Toxicities: Skin rash: Recurrent and required steroids with each Keytruda treatment.  Since it happened this most recent treatment as well I reviewed this with Dr. Pamelia Hoit who recommended discontinuation of Keytruda.  She was recommended to continue the steroid taper  Israela was recommended to continue capecitabine 2 weeks on and 1 week off.  She will return in 6 weeks for port flush and labs along with follow-up with Dr. Pamelia Hoit.   All questions were answered. The patient knows to call the clinic with any problems, questions or concerns. We can certainly see the patient much sooner if necessary.  Total encounter time:30 minutes*in face-to-face visit time, chart review, lab review, care coordination, order entry, and documentation of the encounter time.    Lillard Anes, NP 09/08/23 1:38 PM Medical Oncology and Hematology [facility] [address] [place], [place] [address] Tel. [phone]    Fax. [phone]  *Total Encounter Time as defined by the Centers for Medicare and Medicaid Services includes, in addition to the face-to-face time of a patient visit (documented in the note above) non-face-to-face time: obtaining and reviewing outside history, ordering and reviewing medications, tests or procedures, care coordination (communications with other health care professionals or caregivers) and documentation in the medical record.

## 2023-09-08 NOTE — Progress Notes (Signed)
Specialty Pharmacy Refill Coordination Note  SANELA MOGEL is a 67 y.o. female contacted today regarding refills of specialty medication(s) Capecitabine   Patient requested Delivery   Delivery date: 09/22/23   Verified address: [address] [place] [address]   Medication will be filled on 09/21/23.

## 2023-09-08 NOTE — Assessment & Plan Note (Signed)
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  No distant metastatic disease. Bone scan 10/29/2022: No evidence of bone metastasis   11/08/2022 and 11/18/2022: Margin reexcision surgeries: Final margin focal positivity.  After much discussion the plan is to finish her systemic treatment and come back at a later point to do mastectomy.   Treatment summary: Adjuvant chemotherapy with dose dense Adriamycin and Cytoxan followed by Taxol to be completed 05/02/2023 Mastectomy by Dr. Dwain Sarna 05/03/2023:Right mastectomy: 8 cm grade 3 IDC with extensive lymphatic and vascular invasion RCB class III, deep margin multifocally positive, 5/5 lymph nodes positive, ER weak 10%, PR 0%, HER2 0, Ki-67 30%, total lymph nodes: 10/10 (prior lumpectomy she had 5 positive nodes) Adjuvant radiation to start 06/09/2023-07/21/23 ------------------------------------------------------------------------------------------------------------------------------------------------ CT CAP 05/22/2023: Similar to slightly decreased size of right axillary and subpectoral lymph nodes few prominent supraclavicular nodes increased or new (nonspecific versus metastatic), liver hypodensity indeterminate sclerosis left ninth rib fracture Bone scan 05/22/2023: Left ninth rib fracture otherwise negative MRI of the liver 06/12/23: Benign MRI brain 08/02/2023: Benign   Current treatment: Pembrolizumab maintenance every 3  weeks started 05/25/2023 with capecitabine Toxicities: Skin rash: Recurrent and required steroids with each Keytruda treatment.  Since it happened this most recent treatment as well I reviewed this with Dr. Pamelia Hoit who recommended discontinuation of Keytruda.  She was recommended to continue the steroid taper  [place] was recommended to continue capecitabine 2 weeks on and 1 week off.  She will return in 6 weeks for port flush and labs along with follow-up with Dr. Pamelia Hoit.

## 2023-09-19 ENCOUNTER — Encounter: Payer: Self-pay | Admitting: *Deleted

## 2023-09-21 ENCOUNTER — Other Ambulatory Visit: Payer: Self-pay

## 2023-09-29 ENCOUNTER — Ambulatory Visit: Payer: BC Managed Care – PPO

## 2023-09-29 ENCOUNTER — Inpatient Hospital Stay: Payer: BC Managed Care – PPO

## 2023-09-29 ENCOUNTER — Inpatient Hospital Stay: Payer: BC Managed Care – PPO | Admitting: Hematology and Oncology

## 2023-10-06 ENCOUNTER — Other Ambulatory Visit (HOSPITAL_COMMUNITY): Payer: Self-pay

## 2023-10-06 ENCOUNTER — Other Ambulatory Visit (HOSPITAL_COMMUNITY): Payer: Self-pay | Admitting: Pharmacy Technician

## 2023-10-06 NOTE — Progress Notes (Signed)
 Specialty Pharmacy Refill Coordination Note  Caitlin [name] is a 68 y.o. female contacted today regarding refills of specialty medication(s) Capecitabine  (XELODA )   Patient requested Delivery   Delivery date: 10/12/23   Verified address: [address] [place]   Medication will be filled on 10/11/23.

## 2023-10-19 ENCOUNTER — Other Ambulatory Visit: Payer: Self-pay | Admitting: *Deleted

## 2023-10-19 DIAGNOSIS — Z170 Estrogen receptor positive status [ER+]: Secondary | ICD-10-CM

## 2023-10-20 ENCOUNTER — Inpatient Hospital Stay (HOSPITAL_BASED_OUTPATIENT_CLINIC_OR_DEPARTMENT_OTHER): Payer: BC Managed Care – PPO | Admitting: Hematology and Oncology

## 2023-10-20 ENCOUNTER — Ambulatory Visit: Payer: BC Managed Care – PPO

## 2023-10-20 ENCOUNTER — Inpatient Hospital Stay: Payer: BC Managed Care – PPO | Attending: Hematology and Oncology

## 2023-10-20 ENCOUNTER — Encounter: Payer: Self-pay | Admitting: Hematology and Oncology

## 2023-10-20 VITALS — BP 112/63 | HR 77 | Temp 98.2°F | Resp 18 | Ht 63.0 in | Wt 163.6 lb

## 2023-10-20 DIAGNOSIS — Z170 Estrogen receptor positive status [ER+]: Secondary | ICD-10-CM | POA: Diagnosis not present

## 2023-10-20 DIAGNOSIS — Z79620 Long term (current) use of immunosuppressive biologic: Secondary | ICD-10-CM | POA: Insufficient documentation

## 2023-10-20 DIAGNOSIS — Z923 Personal history of irradiation: Secondary | ICD-10-CM | POA: Diagnosis not present

## 2023-10-20 DIAGNOSIS — L309 Dermatitis, unspecified: Secondary | ICD-10-CM | POA: Insufficient documentation

## 2023-10-20 DIAGNOSIS — Z9221 Personal history of antineoplastic chemotherapy: Secondary | ICD-10-CM | POA: Insufficient documentation

## 2023-10-20 DIAGNOSIS — C50411 Malignant neoplasm of upper-outer quadrant of right female breast: Secondary | ICD-10-CM | POA: Insufficient documentation

## 2023-10-20 DIAGNOSIS — C773 Secondary and unspecified malignant neoplasm of axilla and upper limb lymph nodes: Secondary | ICD-10-CM | POA: Diagnosis not present

## 2023-10-20 DIAGNOSIS — Z79899 Other long term (current) drug therapy: Secondary | ICD-10-CM | POA: Diagnosis not present

## 2023-10-20 DIAGNOSIS — Z9011 Acquired absence of right breast and nipple: Secondary | ICD-10-CM | POA: Diagnosis not present

## 2023-10-20 LAB — CBC WITH DIFFERENTIAL (CANCER CENTER ONLY)
Abs Immature Granulocytes: 0.03 10*3/uL (ref 0.00–0.07)
Basophils Absolute: 0 10*3/uL (ref 0.0–0.1)
Basophils Relative: 0 %
Eosinophils Absolute: 0.6 10*3/uL — ABNORMAL HIGH (ref 0.0–0.5)
Eosinophils Relative: 8 %
HCT: 35 % — ABNORMAL LOW (ref 36.0–46.0)
Hemoglobin: 12 g/dL (ref 12.0–15.0)
Immature Granulocytes: 0 %
Lymphocytes Relative: 12 %
Lymphs Abs: 0.8 10*3/uL (ref 0.7–4.0)
MCH: 37.5 pg — ABNORMAL HIGH (ref 26.0–34.0)
MCHC: 34.3 g/dL (ref 30.0–36.0)
MCV: 109.4 fL — ABNORMAL HIGH (ref 80.0–100.0)
Monocytes Absolute: 0.8 10*3/uL (ref 0.1–1.0)
Monocytes Relative: 12 %
Neutro Abs: 5 10*3/uL (ref 1.7–7.7)
Neutrophils Relative %: 68 %
Platelet Count: 266 10*3/uL (ref 150–400)
RBC: 3.2 MIL/uL — ABNORMAL LOW (ref 3.87–5.11)
RDW: 17.6 % — ABNORMAL HIGH (ref 11.5–15.5)
WBC Count: 7.2 10*3/uL (ref 4.0–10.5)
nRBC: 0 % (ref 0.0–0.2)

## 2023-10-20 LAB — CMP (CANCER CENTER ONLY)
ALT: 11 U/L (ref 0–44)
AST: 17 U/L (ref 15–41)
Albumin: 4 g/dL (ref 3.5–5.0)
Alkaline Phosphatase: 114 U/L (ref 38–126)
Anion gap: 6 (ref 5–15)
BUN: 21 mg/dL (ref 8–23)
CO2: 27 mmol/L (ref 22–32)
Calcium: 9.7 mg/dL (ref 8.9–10.3)
Chloride: 106 mmol/L (ref 98–111)
Creatinine: 0.86 mg/dL (ref 0.44–1.00)
GFR, Estimated: >60 mL/min (ref >60)
Glucose, Bld: 86 mg/dL (ref 70–99)
Potassium: 4.2 mmol/L (ref 3.5–5.1)
Sodium: 139 mmol/L (ref 135–145)
Total Bilirubin: 0.6 mg/dL (ref 0.0–1.2)
Total Protein: 6.6 g/dL (ref 6.5–8.1)

## 2023-10-20 NOTE — Assessment & Plan Note (Signed)
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  No distant metastatic disease. Bone scan 10/29/2022: No evidence of bone metastasis   11/08/2022 and 11/18/2022: Margin reexcision surgeries: Final margin focal positivity.  After much discussion the plan is to finish her systemic treatment and come back at a later point to do mastectomy.   Treatment summary: Adjuvant chemotherapy with dose dense Adriamycin and Cytoxan followed by Taxol to be completed 05/02/2023, Keytruda 05/25/2023-08/17/2023 (discontinued due to immune mediated adverse effects: Skin rash) Mastectomy by Dr. Dwain Sarna 05/03/2023:Right mastectomy: 8 cm grade 3 IDC with extensive lymphatic and vascular invasion RCB class III, deep margin multifocally positive, 5/5 lymph nodes positive, ER weak 10%, PR 0%, HER2 0, Ki-67 30%, total lymph nodes: 10/10 (prior lumpectomy she had 5 positive nodes) Adjuvant radiation to start 06/09/2023-07/21/23 Capecitabine started 05/25/2023 ------------------------------------------------------------------------------------------------------------------------------------------------ CT CAP 05/22/2023: Similar to slightly decreased size of right axillary and subpectoral lymph nodes few prominent supraclavicular nodes increased or new (nonspecific versus metastatic), liver hypodensity indeterminate sclerosis left ninth rib fracture Bone scan 05/22/2023: Left ninth rib fracture otherwise  negative MRI of the liver 06/12/23: Benign MRI brain 08/02/2023: Benign   Current treatment: Capecitabine started 05/25/2023 x 6 months (to be completed November 25, 2023) Capecitabine toxicities:  Plan: Obtain scans after completion of capecitabine.

## 2023-10-20 NOTE — Progress Notes (Signed)
Patient Care Team: [name], MD as PCP - General (Unknown Physician Specialty) Pershing Proud, RN as Oncology Nurse Navigator Donnelly Angelica, RN as Oncology Nurse Navigator [name], MD as Consulting Physician (Hematology and Oncology) Antony Blackbird, MD as Consulting Physician (Radiation Oncology) [name], MD as Consulting Physician (General Surgery)  DIAGNOSIS:  Encounter Diagnosis  Name Primary?   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female, estrogen receptor positive (HCC) Yes    SUMMARY OF ONCOLOGIC HISTORY: Oncology History  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female, estrogen receptor positive (HCC)  09/15/2022 Initial Diagnosis   Screening mammogram detected right breast mass which measured 1.4 cm by ultrasound at 10 o'clock position, biopsy revealed grade 3 IDC with lymphovascular invasion ER 10%, PR 0%, Ki-67 30%, HER2 0 negative   09/25/2022 Cancer Staging   Staging form: Breast, AJCC 8th Edition - Clinical: Stage IB (cT1c, cN0, cM0, G3, ER+, PR-, HER2-) - Signed by [name], MD on 09/25/2022 Histologic grading system: 3 grade system    Genetic Testing   [facility] CancerNext-Expanded Panel+RNA was Negative. Report date is 10/06/2021.  The CancerNext-Expanded gene panel offered by [facility] and includes sequencing, rearrangement, and RNA analysis for the following 77 genes: AIP, ALK, APC, ATM, AXIN2, BAP1, BARD1, BLM, BMPR1A, BRCA1, BRCA2, BRIP1, CDC73, CDH1, CDK4, CDKN1B, CDKN2A, CHEK2, CTNNA1, DICER1, FANCC, FH, FLCN, GALNT12, KIF1B, LZTR1, MAX, MEN1, MET, MLH1, MSH2, MSH3, MSH6, MUTYH, NBN, NF1, NF2, NTHL1, PALB2, PHOX2B, PMS2, POT1, PRKAR1A, PTCH1, PTEN, RAD51C, RAD51D, RB1, RECQL, RET, SDHA, SDHAF2, SDHB, SDHC, SDHD, SMAD4, SMARCA4, SMARCB1, SMARCE1, STK11, SUFU, TMEM127, TP53, TSC1, TSC2, VHL and XRCC2 (sequencing and deletion/duplication); EGFR, EGLN1, HOXB13, KIT, MITF, PDGFRA, POLD1, and  POLE (sequencing only); EPCAM and GREM1 (deletion/duplication only).    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 with high-grade DCIS, inferior margin positive, angiolymphatic invasion present, 5/5 lymph nodes positive, additional medial margin: Poorly differentiated carcinoma with lymphoid stroma, additional inferior margin: Positive, ER 10% weak, PR 0%, HER2 0, Ki-67 30%   10/21/2022 Cancer Staging   Staging form: Breast, AJCC 8th Edition - Pathologic: Stage IIIC (pT1c, pN2a, cM0, G3, ER-, PR-, HER2-) - Signed by [name], MD on 10/21/2022 Stage prefix: Initial diagnosis Histologic grading system: 3 grade system   11/08/2022 Surgery   Margin reexcision: Inferior margin: Grade 3 IDC, new margin positive Medial margin: Grade 3 IDC new margin widely involved   11/18/2022 Surgery   Right inferior margin reexcision: Invasive poorly differentiated adenocarcinoma grade 3 present in new inferior margin.  Medial margin reexcision: Invasive poorly differentiated adenocarcinoma grade 3 present at new medial margin, posterior margin: Poorly differentiated adenocarcinoma grade 3, margin negative  11/08/2022 and 11/18/2022: Final margin focal positivity. After much discussion the plan is to finish her systemic treatment and come back at a later point to do mastectomy.    12/02/2022 - 04/18/2023 Chemotherapy   Patient is on Treatment Plan : BREAST ADJUVANT DOSE DENSE AC q14d / PACLitaxel q7d     05/03/2023 Surgery   Right mastectomy: 8 cm grade 3 IDC with extensive lymphatic and vascular invasion RCB class III, deep margin multifocally positive, 5/5 lymph nodes positive, ER weak 10%, PR 0%, HER2 0, Ki-67 30%   05/25/2023 - 08/17/2023 Chemotherapy   Patient is on Treatment Plan : BREAST Pembrolizumab (200) q21d x 24 months     05/25/2023 -  Adjuvant Chemotherapy  Capectiabine 1000mg  po BID 2 weeks on, 1 week off;  began 05/25/2023, second cycle delayed to 07/06/2023   06/08/2023 - 07/21/2023 Radiation Therapy   First Treatment Date: 2023-06-08 - Last Treatment Date: 2023-07-21   Plan Name: CW_R_BO Site: Chest Wall, Right Technique: 3D Mode: Photon Dose Per Fraction: 2 Gy Prescribed Dose (Delivered / Prescribed): 26 Gy / 26 Gy Prescribed Fxs (Delivered / Prescribed): 13 / 13   Plan Name: CW_R_SCV_PAB Site: Chest Wall, Right Technique: 3D Mode: Photon Dose Per Fraction: 2 Gy Prescribed Dose (Delivered / Prescribed): 50 Gy / 50 Gy Prescribed Fxs (Delivered / Prescribed): 25 / 25   Plan Name: CW_R_Bst_BO Site: Chest Wall, Right Technique: Electron Mode: Electron Dose Per Fraction: 2 Gy Prescribed Dose (Delivered / Prescribed): 14 Gy / 14 Gy Prescribed Fxs (Delivered / Prescribed): 7 / 7   Plan Name: CW_R Site: Chest Wall, Right Technique: 3D Mode: Photon Dose Per Fraction: 2 Gy Prescribed Dose (Delivered / Prescribed): 24 Gy / 24 Gy Prescribed Fxs (Delivered / Prescribed): 12 / 12     CHIEF COMPLIANT: Follow-up on capecitabine  HISTORY OF PRESENT ILLNESS:   History of Present Illness   Caitlin [name], a patient on capecitabine treatment, presents for a follow up. Left forearm is itchy, but not constant, and is managed with topical creams. Caitlin [name] is nearing the end of her capecitabine treatment, which she has tolerated well, with no reported diarrhea or hand-foot syndrome. She also has a port-a-cath in place for her treatment. Caitlin [name]'s energy, appetite, and taste are all reported as normal, with no tingling or numbness in her fingers and toes.         ALLERGIES:  is allergic to penicillins.  MEDICATIONS:  Current Outpatient Medications  Medication Sig Dispense Refill   albuterol (VENTOLIN HFA) 108 (90 Base) MCG/ACT inhaler Inhale 2 puffs into the lungs every 4 (four) hours as needed for shortness of breath (Asthma).     aspirin 81 MG tablet Take 81 mg by mouth daily.       capecitabine (XELODA)  500 MG tablet Take 2 tablets (1,000 mg total) by mouth 2 (two) times daily after a meal. 14 days on and 7 days off 56 tablet 5   diclofenac Sodium (VOLTAREN) 1 % GEL Research Patient: Apply 0.5 grams(1 fingertip) to each hand and each foot twice daily for the duration of radiation therapy 200 g 0   diltiazem (CARDIZEM CD) 120 MG 24 hr capsule Take 120 mg by mouth daily.     levothyroxine (SYNTHROID) 75 MCG tablet Take 75 mcg by mouth daily before breakfast.     Multiple Vitamins-Minerals (MULTIVITAMIN WITH MINERALS) tablet Take 1 tablet by mouth daily.     OVER THE COUNTER MEDICATION Take 1 capsule by mouth 2 (two) times daily. copaiba softgels     PULMICORT FLEXHALER 180 MCG/ACT inhaler Inhale 1 puff into the lungs daily.     No current facility-administered medications for this visit.    PHYSICAL EXAMINATION: ECOG PERFORMANCE STATUS: 1 - Symptomatic but completely ambulatory  Vitals:   10/20/23 0925  BP: 112/63  Pulse: 77  Resp: 18  Temp: 98.2 F (36.8 C)  SpO2: 99%   Filed Weights   10/20/23 0925  Weight: 163 lb 9.6 oz (74.2 kg)      LABORATORY DATA:  I have reviewed the data as listed    Latest Ref Rng & Units 09/08/2023    8:56 AM 08/17/2023    8:33 AM 07/27/2023    8:07  AM  CMP  Glucose 70 - 99 mg/dL 95  90  85   BUN 8 - 23 mg/dL 21  19  19    Creatinine 0.44 - 1.00 mg/dL 1.61  0.96  0.45   Sodium 135 - 145 mmol/L 139  138  138   Potassium 3.5 - 5.1 mmol/L 4.2  4.3  3.8   Chloride 98 - 111 mmol/L 104  105  105   CO2 22 - 32 mmol/L 28  28  27    Calcium 8.9 - 10.3 mg/dL 9.7  9.0  9.1   Total Protein 6.5 - 8.1 g/dL 6.5  6.3  6.5   Total Bilirubin <1.2 mg/dL 0.5  0.6  0.5   Alkaline Phos 38 - 126 U/L 81  66  70   AST 15 - 41 U/L 13  13  12    ALT 0 - 44 U/L 16  15  12      Lab Results  Component Value Date   WBC 7.2 10/20/2023   HGB 12.0 10/20/2023   HCT 35.0 (L) 10/20/2023   MCV 109.4 (H) 10/20/2023   PLT 266 10/20/2023   NEUTROABS 5.0 10/20/2023     ASSESSMENT & PLAN:  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female, estrogen receptor positive (HCC) 10/14/2022:Right lumpectomy: 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  No distant metastatic disease. Bone scan 10/29/2022: No evidence of bone metastasis   11/08/2022 and 11/18/2022: Margin reexcision surgeries: Final margin focal positivity.  After much discussion the plan is to finish her systemic treatment and come back at a later point to do mastectomy.   Treatment summary: Adjuvant chemotherapy with dose dense Adriamycin and Cytoxan followed by Taxol to be completed 05/02/2023, Keytruda 05/25/2023-08/17/2023 (discontinued due to immune mediated adverse effects: Skin rash) Mastectomy by Dr. Dwain Sarna 05/03/2023:Right mastectomy: 8 cm grade 3 IDC with extensive lymphatic and vascular invasion RCB class III, deep margin multifocally positive, 5/5 lymph nodes positive, ER weak 10%, PR 0%, HER2 0, Ki-67 30%, total lymph nodes: 10/10 (prior lumpectomy she had 5 positive nodes) Adjuvant radiation to start 06/09/2023-07/21/23 Capecitabine started 05/25/2023 ------------------------------------------------------------------------------------------------------------------------------------------------ CT CAP 05/22/2023: Similar to slightly decreased size of right axillary and subpectoral lymph nodes few prominent supraclavicular nodes increased or new (nonspecific versus  metastatic), liver hypodensity indeterminate sclerosis left ninth rib fracture Bone scan 05/22/2023: Left ninth rib fracture otherwise negative MRI of the liver 06/12/23: Benign MRI brain 08/02/2023: Benign   Current treatment: Capecitabine started 05/25/2023 x 6 months (to be completed November 25, 2023) Capecitabine toxicities:  Plan: Obtain scans after completion of capecitabine. ------------------------------------- Assessment and Plan    Breast Cancer Patient is on Capecitabine with no significant side effects. Treatment is due to end on 11/25/2023. No new symptoms reported. -Continue Capecitabine as prescribed. -Order full body scan after completion of Capecitabine treatment.  Dermatitis Persistent itching on the arm. No significant improvement with creams. -Continue current management and monitor for changes.  Port Management Patient has a port in place. -Plan to remove port after completion of treatment and post-treatment scans.  General Health Maintenance -Schedule follow-up appointment after scans are completed.          Orders Placed This Encounter  Procedures   CT CHEST ABDOMEN PELVIS W CONTRAST    Standing Status:   Future    Expected Date:   11/25/2023    Expiration Date:   10/19/2024    If indicated for the ordered procedure, I authorize the administration of contrast media per Radiology protocol:   Yes    Does the patient have a contrast media/X-ray dye allergy?:   No    Preferred imaging location?:   [facility]    Release to patient:   Immediate    If indicated for the ordered procedure, I authorize the administration of oral contrast media per Radiology protocol:   Yes   NM Bone Scan Whole Body    Standing Status:   Future    Expected Date:   11/25/2023    Expiration Date:   10/19/2024    If indicated for the ordered procedure, I authorize the administration of a radiopharmaceutical per Radiology protocol:   Yes    Preferred imaging location?:    [facility]    Release to patient:   Immediate   The patient has a good understanding of the overall plan. she agrees with it. she will call with any problems that may develop before the next visit here. Total time spent: 30 mins including face to face time and time spent for planning, charting and co-ordination of care   Tamsen Meek, MD 10/20/23

## 2023-10-21 LAB — THYROID PANEL WITH TSH
Free Thyroxine Index: 2.3 (ref 1.2–4.9)
T3 Uptake Ratio: 25 % (ref 24–39)
T4, Total: 9.3 ug/dL (ref 4.5–12.0)
TSH: 22 u[IU]/mL — ABNORMAL HIGH (ref 0.450–4.500)

## 2023-10-25 ENCOUNTER — Other Ambulatory Visit: Payer: Self-pay

## 2023-10-25 ENCOUNTER — Other Ambulatory Visit (HOSPITAL_COMMUNITY): Payer: Self-pay

## 2023-10-25 ENCOUNTER — Other Ambulatory Visit (HOSPITAL_COMMUNITY): Payer: Self-pay | Admitting: Pharmacy Technician

## 2023-10-25 NOTE — Progress Notes (Signed)
Specialty Pharmacy Refill Coordination Note  Caitlin [name] is a 67 y.o. female contacted today regarding refills of specialty medication(s) Capecitabine (XELODA)   Patient requested Delivery   Delivery date: 11/02/23   Verified address: Patient address [address]  [place]   Medication will be filled on 11/01/23.

## 2023-11-01 ENCOUNTER — Other Ambulatory Visit: Payer: Self-pay

## 2023-11-03 ENCOUNTER — Ambulatory Visit: Payer: BC Managed Care – PPO | Attending: General Surgery

## 2023-11-03 VITALS — Wt 159.5 lb

## 2023-11-03 DIAGNOSIS — Z483 Aftercare following surgery for neoplasm: Secondary | ICD-10-CM | POA: Insufficient documentation

## 2023-11-03 NOTE — Therapy (Signed)
OUTPATIENT PHYSICAL THERAPY SOZO SCREENING NOTE   Patient Name: Caitlin [name] MRN: [id number] DOB:30-May-1956, 68 y.o., female Today's Date: 11/03/2023  PCP: [name], MD REFERRING PROVIDER: [name], MD   PT End of Session - 11/03/23 1012     Visit Number 2   # unchanged due to screen only   PT Start Time 1010    PT Stop Time 1018    PT Time Calculation (min) 8 min    Activity Tolerance Patient tolerated treatment well    Behavior During Therapy [facility] for tasks assessed/performed             Past Medical History:  Diagnosis Date   Asthma    Complication of anesthesia    Dyspnea    Dysrhythmia    2012   Headache    History of kidney stones    passed   Hypothyroidism    Pneumonia 01/26/2023   PONV (postoperative nausea and vomiting)    rt breast ca 08/2022   Right Breast   Scoliosis    Thyroid disease    Past Surgical History:  Procedure Laterality Date   APPENDECTOMY     AXILLARY SENTINEL NODE BIOPSY Right 10/14/2022   Procedure: RIGHT AXILLARY SENTINEL NODE BIOPSY;  Surgeon: [name], MD;  Location: [facility] OR;  Service: General;  Laterality: Right;   BREAST BIOPSY Right 09/15/2022   [place] RT BREAST BX W LOC DEV 1ST LESION IMG BX SPEC US GUIDE 09/15/2022 [facility]MO[facility]HY   BREAST BIOPSY  10/12/2022   [place] RT RADIOACTIVE SEED LOC 10/12/2022 [facility] MAMMOGRAPHY   BREAST LUMPECTOMY WITH RADIOACTIVE SEED AND SENTINEL LYMPH NODE BIOPSY Right 10/14/2022   Procedure: RIGHT BREAST LUMPECTOMY WITH RADIOACTIVE SEED;  Surgeon: [name], MD;  Location: [facility] OR;  Service: General;  Laterality: Right;   COLONOSCOPY     LAPAROSCOPIC APPENDECTOMY     PORT-A-CATH REMOVAL Left 05/03/2023   Procedure: REMOVAL PORT-A-CATH;  Surgeon: [name], MD;  Location: [facility] OR;  Service: General;  Laterality: Left;   PORTACATH PLACEMENT Left 10/14/2022   Procedure: INSERTION PORT-A-CATH;  Surgeon: [name], MD;  Location: [facility] OR;  Service: General;   Laterality: Left;   PORTACATH PLACEMENT Left 05/19/2023   Procedure: PORT PLACEMENT WITH ULTRASOUND GUIDANCE;  Surgeon: [name], MD;  Location: [facility];  Service: General;  Laterality: Left;   RE-EXCISION OF BREAST LUMPECTOMY Right 11/08/2022   Procedure: RE-EXCISION OF RIGHT BREAST LUMPECTOMY;  Surgeon: [name], MD;  Location: [facility];  Service: General;  Laterality: Right;   RE-EXCISION OF BREAST LUMPECTOMY Right 11/18/2022   Procedure: RE-EXCISION OF RIGHT BREAST LUMPECTOMY;  Surgeon: [name], MD;  Location: [facility] OR;  Service: General;  Laterality: Right;   SIMPLE MASTECTOMY WITH AXILLARY SENTINEL NODE BIOPSY Right 05/03/2023   Procedure: RIGHT MASTECTOMY;  Surgeon: [name], MD;  Location: [facility] OR;  Service: General;  Laterality: Right;  GEN & PEC BLOCK   WISDOM TOOTH EXTRACTION     Patient Active Problem List   Diagnosis Date Noted   S/P mastectomy, right 05/03/2023   Port-A-Cath in place 12/16/2022   Genetic testing 10/11/2022   Appendicitis with peritonitis 09/29/2022   Family history of prostate cancer 09/29/2022   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female, estrogen receptor positive (HCC) 09/22/2022   Atrial fibrillation with RVR (HCC) 11/30/2010   Chronic anticoagulation 11/30/2010   Hypothyroidism 11/30/2010   PALPITATIONS 11/27/2010    REFERRING DIAG: right breast cancer  at risk for lymphedema  THERAPY DIAG: Aftercare following surgery for neoplasm  PERTINENT HISTORY: Patient was diagnosed on 09/15/22 with right grade 3. It measures 1.4 x 0.9 x 1.2 cm and is located in the upper-outer quadrant. It is ER+, PR-, HER2- with a Ki67 of 30%. 10/14/22: R breast lumpectomy 5/5 nodes, 11/08/22: Re excision of R lumpectomy, 11/18/22 - Re excision of R lumpectomy,completed neoadjuvant, 05/03/23 R mastectomy with positive margins 5/5 nodes, pt is currently taking [place] and xeloda, will begin  radiation on 06/08/23   PRECAUTIONS: right UE Lymphedema risk, None  SUBJECTIVE: Pt returns for her first 3 month L-Dex screen.   PAIN:  Are you having pain? No  SOZO SCREENING: Patient was assessed today using the SOZO machine to determine the lymphedema index score. This was compared to her baseline score. It was determined that she is within the recommended range when compared to her baseline and no further action is needed at this time. She will continue SOZO screenings. These are done every 3 months for 2 years post operatively followed by every 6 months for 2 years, and then annually.  Briefly answered pts questions about what slightly higher change from baseline means and that yes it's higher, but it is still WNLs. Pt has a compression sleeve so suggested that she could wear this for 4-5 hours every other day and/or during periods of increased activity like walking. Pt verbalized understanding.    L-DEX FLOWSHEETS - 11/03/23 1000       L-DEX LYMPHEDEMA SCREENING   Measurement Type Unilateral    L-DEX MEASUREMENT EXTREMITY Upper Extremity    POSITION  Standing    DOMINANT SIDE Right    At Risk Side Right    BASELINE SCORE (UNILATERAL) -2.9    L-DEX SCORE (UNILATERAL) 2.3    VALUE CHANGE (UNILAT) 5.2              Hermenia Bers, PTA 11/03/2023, 10:20 AM

## 2023-11-04 ENCOUNTER — Encounter: Payer: Self-pay | Admitting: Hematology and Oncology

## 2023-11-04 ENCOUNTER — Other Ambulatory Visit (HOSPITAL_COMMUNITY): Payer: Self-pay

## 2023-11-16 ENCOUNTER — Other Ambulatory Visit: Payer: Self-pay

## 2023-11-16 NOTE — Progress Notes (Signed)
Specialty Pharmacy Ongoing Clinical Assessment Note  Caitlin [name] is a 68 y.o. female who is being followed by the specialty pharmacy service for RxSp Oncology   [name] specialty medication(s) reviewed today: Capecitabine (XELODA)   Missed doses in the last 4 weeks: 0   Patient/Caregiver did not have any additional questions or concerns.   Therapeutic benefit summary: Unable to assess   Adverse events/side effects summary: No adverse events/side effects   [name] therapy is appropriate to: Continue    Goals Addressed             This Visit's Progress    Slow Disease Progression       Patient is unable to be assessed as therapy was recently initiated. Patient will be evaluated at upcoming provider appointment to assess progress         Follow up:   [name] [facility] Specialty Pharmacist

## 2023-11-21 ENCOUNTER — Encounter: Payer: Self-pay | Admitting: Hematology and Oncology

## 2023-11-22 ENCOUNTER — Ambulatory Visit (HOSPITAL_COMMUNITY)
Admission: RE | Admit: 2023-11-22 | Discharge: 2023-11-22 | Disposition: A | Discharge status: Home / Self Care | Payer: BC Managed Care – PPO | Source: Ambulatory Visit | Attending: Hematology and Oncology | Admitting: Hematology and Oncology

## 2023-11-22 DIAGNOSIS — C50411 Malignant neoplasm of upper-outer quadrant of right female breast: Secondary | ICD-10-CM

## 2023-11-22 DIAGNOSIS — Z170 Estrogen receptor positive status [ER+]: Secondary | ICD-10-CM | POA: Diagnosis present

## 2023-11-22 MED ORDER — HEPARIN SOD (PORK) LOCK FLUSH 100 UNIT/ML IV SOLN
500.0000 [IU] | Freq: Once | INTRAVENOUS | Status: AC
  Administered 2023-11-22: 500 [IU] via INTRAVENOUS

## 2023-11-22 MED ORDER — TECHNETIUM TC 99M MEDRONATE IV KIT: 20.0000 | PACK | Freq: Once | INTRAVENOUS | Status: DC | PRN

## 2023-11-22 MED ORDER — HEPARIN SOD (PORK) LOCK FLUSH 100 UNIT/ML IV SOLN
INTRAVENOUS | Status: AC
  Filled 2023-11-22: qty 5

## 2023-11-22 MED ORDER — IOHEXOL 300 MG/ML  SOLN
100.0000 mL | Freq: Once | INTRAMUSCULAR | Status: AC | PRN
  Administered 2023-11-22: 100 mL via INTRAVENOUS

## 2023-11-23 ENCOUNTER — Ambulatory Visit (HOSPITAL_COMMUNITY): Payer: BC Managed Care – PPO

## 2023-11-30 ENCOUNTER — Encounter: Payer: Self-pay | Admitting: Hematology and Oncology

## 2023-12-01 ENCOUNTER — Inpatient Hospital Stay: Payer: BC Managed Care – PPO | Attending: Hematology and Oncology | Admitting: Hematology and Oncology

## 2023-12-01 VITALS — BP 129/71 | HR 78 | Temp 98.0°F | Resp 18 | Ht 63.0 in | Wt 160.9 lb

## 2023-12-01 DIAGNOSIS — C50411 Malignant neoplasm of upper-outer quadrant of right female breast: Secondary | ICD-10-CM | POA: Insufficient documentation

## 2023-12-01 DIAGNOSIS — Z1732 Human epidermal growth factor receptor 2 negative status: Secondary | ICD-10-CM | POA: Insufficient documentation

## 2023-12-01 DIAGNOSIS — Z1722 Progesterone receptor negative status: Secondary | ICD-10-CM | POA: Insufficient documentation

## 2023-12-01 DIAGNOSIS — Z9011 Acquired absence of right breast and nipple: Secondary | ICD-10-CM | POA: Diagnosis not present

## 2023-12-01 DIAGNOSIS — Z79899 Other long term (current) drug therapy: Secondary | ICD-10-CM | POA: Diagnosis not present

## 2023-12-01 DIAGNOSIS — Z923 Personal history of irradiation: Secondary | ICD-10-CM | POA: Insufficient documentation

## 2023-12-01 DIAGNOSIS — Z170 Estrogen receptor positive status [ER+]: Secondary | ICD-10-CM | POA: Diagnosis not present

## 2023-12-01 DIAGNOSIS — Z780 Asymptomatic menopausal state: Secondary | ICD-10-CM

## 2023-12-01 NOTE — Assessment & Plan Note (Signed)
 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  No distant metastatic disease. Bone scan 10/29/2022: No evidence of bone metastasis   11/08/2022 and 11/18/2022: Margin reexcision surgeries: Final margin focal positivity.  After much discussion the plan is to finish her systemic treatment and come back at a later point to do mastectomy.   Treatment summary: Adjuvant chemotherapy with dose dense Adriamycin and Cytoxan followed by Taxol to be completed 05/02/2023, Keytruda 05/25/2023-08/17/2023 (discontinued due to immune mediated adverse effects: Skin rash) Mastectomy by Dr. Dwain Sarna 05/03/2023:Right mastectomy: 8 cm grade 3 IDC with extensive lymphatic and vascular invasion RCB class III, deep margin multifocally positive, 5/5 lymph nodes positive, ER weak 10%, PR 0%, HER2 0, Ki-67 30%, total lymph nodes: 10/10 (prior lumpectomy she had 5 positive nodes) Adjuvant radiation to start 06/09/2023-07/21/23 Capecitabine started 05/25/2023 ------------------------------------------------------------------------------------------------------------------------------------------------ CT CAP 05/22/2023: Similar to slightly decreased size of right axillary and subpectoral lymph nodes few prominent supraclavicular nodes increased or new (nonspecific versus metastatic), liver hypodensity indeterminate sclerosis left ninth rib fracture Bone scan 05/22/2023: Left ninth rib fracture otherwise  negative MRI of the liver 06/12/23: Benign MRI brain 08/02/2023: Benign CT CAP and bone scan 11/23/2023: New clustered irregular nodules posterior left upper lobe 10 mm favored infectious/inflammatory, new subpleural reticulations anterior right upper lobe postradiation.  No metastatic disease   Prior treatment: Capecitabine started 05/25/2023 x 6 months (completed November 25, 2023) We will continue active surveillance and follow-ups every 6 months. Guardant reveal for MRD monitoring

## 2023-12-01 NOTE — Progress Notes (Signed)
 Patient Care Team: [name], MD as PCP - General (Unknown Physician Specialty) Pershing Proud, RN as Oncology Nurse Navigator Donnelly Angelica, RN as Oncology Nurse Navigator [name], MD as Consulting Physician (Hematology and Oncology) Antony Blackbird, MD as Consulting Physician (Radiation Oncology) [name], MD as Consulting Physician (General Surgery)  DIAGNOSIS:  Encounter Diagnoses  Name Primary?   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female, estrogen receptor positive (HCC) Yes   Post-menopausal     SUMMARY OF ONCOLOGIC HISTORY: Oncology History  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female, estrogen receptor positive (HCC)  09/15/2022 Initial Diagnosis   Screening mammogram detected right breast mass which measured 1.4 cm by ultrasound at 10 o'clock position, biopsy revealed grade 3 IDC with lymphovascular invasion ER 10%, PR 0%, Ki-67 30%, HER2 0 negative   09/25/2022 Cancer Staging   Staging form: Breast, AJCC 8th Edition - Clinical: Stage IB (cT1c, cN0, cM0, G3, ER+, PR-, HER2-) - Signed by [name], MD on 09/25/2022 Histologic grading system: 3 grade system    Genetic Testing   [facility] CancerNext-Expanded Panel+RNA was Negative. Report date is 10/06/2021.  The CancerNext-Expanded gene panel offered by [facility] and includes sequencing, rearrangement, and RNA analysis for the following 77 genes: AIP, ALK, APC, ATM, AXIN2, BAP1, BARD1, BLM, BMPR1A, BRCA1, BRCA2, BRIP1, CDC73, CDH1, CDK4, CDKN1B, CDKN2A, CHEK2, CTNNA1, DICER1, FANCC, FH, FLCN, GALNT12, KIF1B, LZTR1, MAX, MEN1, MET, MLH1, MSH2, MSH3, MSH6, MUTYH, NBN, NF1, NF2, NTHL1, PALB2, PHOX2B, PMS2, POT1, PRKAR1A, PTCH1, PTEN, RAD51C, RAD51D, RB1, RECQL, RET, SDHA, SDHAF2, SDHB, SDHC, SDHD, SMAD4, SMARCA4, SMARCB1, SMARCE1, STK11, SUFU, TMEM127, TP53, TSC1, TSC2, VHL and XRCC2 (sequencing and deletion/duplication); EGFR, EGLN1, HOXB13, KIT,  MITF, PDGFRA, POLD1, and POLE (sequencing only); EPCAM and GREM1 (deletion/duplication only).    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 with high-grade DCIS, inferior margin positive, angiolymphatic invasion present, 5/5 lymph nodes positive, additional medial margin: Poorly differentiated carcinoma with lymphoid stroma, additional inferior margin: Positive, ER 10% weak, PR 0%, HER2 0, Ki-67 30%   10/21/2022 Cancer Staging   Staging form: Breast, AJCC 8th Edition - Pathologic: Stage IIIC (pT1c, pN2a, cM0, G3, ER-, PR-, HER2-) - Signed by [name], MD on 10/21/2022 Stage prefix: Initial diagnosis Histologic grading system: 3 grade system   11/08/2022 Surgery   Margin reexcision: Inferior margin: Grade 3 IDC, new margin positive Medial margin: Grade 3 IDC new margin widely involved   11/18/2022 Surgery   Right inferior margin reexcision: Invasive poorly differentiated adenocarcinoma grade 3 present in new inferior margin.  Medial margin reexcision: Invasive poorly differentiated adenocarcinoma grade 3 present at new medial margin, posterior margin: Poorly differentiated adenocarcinoma grade 3, margin negative  11/08/2022 and 11/18/2022: Final margin focal positivity. After much discussion the plan is to finish her systemic treatment and come back at a later point to do mastectomy.    12/02/2022 - 04/18/2023 Chemotherapy   Patient is on Treatment Plan : BREAST ADJUVANT DOSE DENSE AC q14d / PACLitaxel q7d     05/03/2023 Surgery   Right mastectomy: 8 cm grade 3 IDC with extensive lymphatic and vascular invasion RCB class III, deep margin multifocally positive, 5/5 lymph nodes positive, ER weak 10%, PR 0%, HER2 0, Ki-67 30%   05/25/2023 - 08/17/2023 Chemotherapy   Patient is on Treatment Plan : BREAST Pembrolizumab (200) q21d x 24 months     05/25/2023 -  Adjuvant Chemotherapy   Capectiabine 1000mg  po BID 2  weeks on, 1 week off; began 05/25/2023, second cycle delayed to 07/06/2023   06/08/2023 - 07/21/2023 Radiation Therapy   First Treatment Date: 2023-06-08 - Last Treatment Date: 2023-07-21   Plan Name: CW_R_BO Site: Chest Wall, Right Technique: 3D Mode: Photon Dose Per Fraction: 2 Gy Prescribed Dose (Delivered / Prescribed): 26 Gy / 26 Gy Prescribed Fxs (Delivered / Prescribed): 13 / 13   Plan Name: CW_R_SCV_PAB Site: Chest Wall, Right Technique: 3D Mode: Photon Dose Per Fraction: 2 Gy Prescribed Dose (Delivered / Prescribed): 50 Gy / 50 Gy Prescribed Fxs (Delivered / Prescribed): 25 / 25   Plan Name: CW_R_Bst_BO Site: Chest Wall, Right Technique: Electron Mode: Electron Dose Per Fraction: 2 Gy Prescribed Dose (Delivered / Prescribed): 14 Gy / 14 Gy Prescribed Fxs (Delivered / Prescribed): 7 / 7   Plan Name: CW_R Site: Chest Wall, Right Technique: 3D Mode: Photon Dose Per Fraction: 2 Gy Prescribed Dose (Delivered / Prescribed): 24 Gy / 24 Gy Prescribed Fxs (Delivered / Prescribed): 12 / 12     CHIEF COMPLIANT: Follow-up after completion of capecitabine and to follow-up on scans  HISTORY OF PRESENT ILLNESS: Caitlin [name] is a 68 year old female who presents for follow-up after completing Xeloda treatment.  She completed her Xeloda treatment approximately one week ago and is experiencing fatigue, which she attributes to the medication. She also had gastrointestinal upset and dryness with cracking of her fingers during the treatment.  A recent CT scan revealed a cluster in the left upper lung, suspected to be an infectious process. She had a recent respiratory infection treated with antibiotics and prednisone, during which she experienced coughing spells. The CT scan also showed radiation scarring on the right side and findings related to previous rib fractures. She is unsure of the cause of these rib fractures.  There is a discussion about the potential need for a bone  density test due to concerns about bone softness, possibly related to her treatment history. She has not had a recent bone density test.    ALLERGIES:  is allergic to penicillins.  MEDICATIONS:  Current Outpatient Medications  Medication Sig Dispense Refill   albuterol (VENTOLIN HFA) 108 (90 Base) MCG/ACT inhaler Inhale 2 puffs into the lungs every 4 (four) hours as needed for shortness of breath (Asthma).     aspirin 81 MG tablet Take 81 mg by mouth daily.       diltiazem (CARDIZEM CD) 120 MG 24 hr capsule Take 120 mg by mouth daily.     levothyroxine (SYNTHROID) 75 MCG tablet Take 75 mcg by mouth daily before breakfast.     Multiple Vitamins-Minerals (MULTIVITAMIN WITH MINERALS) tablet Take 1 tablet by mouth daily.     OVER THE COUNTER MEDICATION Take 1 capsule by mouth 2 (two) times daily. copaiba softgels     PULMICORT FLEXHALER 180 MCG/ACT inhaler Inhale 1 puff into the lungs daily.     No current facility-administered medications for this visit.    PHYSICAL EXAMINATION: ECOG PERFORMANCE STATUS: 1 - Symptomatic but completely ambulatory  Vitals:   12/01/23 0942  BP: 129/71  Pulse: 78  Resp: 18  Temp: 98 F (36.7 C)  SpO2: 99%   Filed Weights   12/01/23 0942  Weight: 160 lb 14.4 oz (73 kg)      LABORATORY DATA:  I have reviewed the data as listed    Latest Ref Rng & Units 10/20/2023    9:09 AM  09/08/2023    8:56 AM 08/17/2023    8:33 AM  CMP  Glucose 70 - 99 mg/dL 86  95  90   BUN 8 - 23 mg/dL 21  21  19    Creatinine 0.44 - 1.00 mg/dL 5.28  4.13  2.44   Sodium 135 - 145 mmol/L 139  139  138   Potassium 3.5 - 5.1 mmol/L 4.2  4.2  4.3   Chloride 98 - 111 mmol/L 106  104  105   CO2 22 - 32 mmol/L 27  28  28    Calcium 8.9 - 10.3 mg/dL 9.7  9.7  9.0   Total Protein 6.5 - 8.1 g/dL 6.6  6.5  6.3   Total Bilirubin 0.0 - 1.2 mg/dL 0.6  0.5  0.6   Alkaline Phos 38 - 126 U/L 114  81  66   AST 15 - 41 U/L 17  13  13    ALT 0 - 44 U/L 11  16  15      Lab Results   Component Value Date   WBC 7.2 10/20/2023   HGB 12.0 10/20/2023   HCT 35.0 (L) 10/20/2023   MCV 109.4 (H) 10/20/2023   PLT 266 10/20/2023   NEUTROABS 5.0 10/20/2023    ASSESSMENT & PLAN:  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female, estrogen receptor positive (HCC) 10/14/2022:Right lumpectomy: 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  No distant metastatic disease. Bone scan 10/29/2022: No evidence of bone metastasis   11/08/2022 and 11/18/2022: Margin reexcision surgeries: Final margin focal positivity.  After much discussion the plan is to finish her systemic treatment and come back at a later point to do mastectomy.   Treatment summary: Adjuvant chemotherapy with dose dense Adriamycin and Cytoxan followed by Taxol to be completed 05/02/2023, Keytruda 05/25/2023-08/17/2023 (discontinued due to immune mediated adverse effects: Skin rash) Mastectomy by Dr. Dwain Sarna 05/03/2023:Right mastectomy: 8 cm grade 3 IDC with extensive lymphatic and vascular invasion RCB class III, deep margin multifocally positive, 5/5 lymph nodes positive, ER weak 10%, PR 0%, HER2 0, Ki-67 30%, total lymph nodes: 10/10 (prior lumpectomy she had 5 positive nodes) Adjuvant radiation to start 06/09/2023-07/21/23 Capecitabine started 05/25/2023 ------------------------------------------------------------------------------------------------------------------------------------------------ CT  CAP 05/22/2023: Similar to slightly decreased size of right axillary and subpectoral lymph nodes few prominent supraclavicular nodes increased or new (nonspecific versus metastatic), liver hypodensity indeterminate sclerosis left ninth rib fracture Bone scan 05/22/2023: Left ninth rib fracture otherwise negative MRI of the liver 06/12/23: Benign MRI brain 08/02/2023: Benign CT CAP and bone scan 11/23/2023: New clustered irregular nodules posterior left upper lobe 10 mm favored infectious/inflammatory, new subpleural reticulations anterior right upper lobe postradiation.  No metastatic disease   Prior treatment: Capecitabine started 05/25/2023 x 6 months (completed November 25, 2023) We will continue active surveillance and follow-ups every 6 months. Guardant reveal for MRD monitoring Bone density will be ordered to evaluate the rib fractures which could be related to the recent coughing spells. Mammograms will be ordered for May. Return to clinic in 6 months for follow-up.   Orders  Placed This Encounter  Procedures   DG Bone Density    Standing Status:   Future    Expected Date:   01/19/2024    Expiration Date:   11/30/2024    Reason for Exam (SYMPTOM  OR DIAGNOSIS REQUIRED):   Post menopausal    Preferred imaging location?:   [facility]    Release to patient:   Immediate   MM DIAG BREAST TOMO BILATERAL    Standing Status:   Future    Expected Date:   02/15/2024    Expiration Date:   11/30/2024    Reason for Exam (SYMPTOM  OR DIAGNOSIS REQUIRED):   Annual mammograms with H/O breast cancer    Preferred imaging location?:   [facility]    Release to patient:   Immediate   The patient has a good understanding of the overall plan. she agrees with it. she will call with any problems that may develop before the next visit here. Total time spent: 30 mins including face to face time and time spent for planning, charting and co-ordination of care   Tamsen Meek, MD 12/01/23

## 2023-12-02 ENCOUNTER — Telehealth: Payer: Self-pay

## 2023-12-02 NOTE — Telephone Encounter (Signed)
 Per md orders entered for [employer] and all supported documents faxed to [phone]. Faxed confirmation was received.

## 2023-12-16 ENCOUNTER — Encounter: Payer: Self-pay | Admitting: Hematology and Oncology

## 2023-12-20 ENCOUNTER — Other Ambulatory Visit: Payer: Self-pay | Admitting: General Surgery

## 2023-12-20 ENCOUNTER — Encounter: Payer: Self-pay | Admitting: Hematology and Oncology

## 2023-12-21 ENCOUNTER — Ambulatory Visit (HOSPITAL_BASED_OUTPATIENT_CLINIC_OR_DEPARTMENT_OTHER)
Admission: RE | Admit: 2023-12-21 | Discharge: 2023-12-21 | Disposition: A | Discharge status: Home / Self Care | Source: Ambulatory Visit | Attending: Hematology and Oncology | Admitting: Hematology and Oncology

## 2023-12-21 ENCOUNTER — Inpatient Hospital Stay: Attending: Hematology and Oncology | Admitting: Hematology and Oncology

## 2023-12-21 VITALS — BP 132/77 | HR 87 | Temp 98.5°F | Resp 17 | Ht 63.0 in | Wt 161.2 lb

## 2023-12-21 DIAGNOSIS — Z170 Estrogen receptor positive status [ER+]: Secondary | ICD-10-CM

## 2023-12-21 DIAGNOSIS — Z780 Asymptomatic menopausal state: Secondary | ICD-10-CM

## 2023-12-21 DIAGNOSIS — C50411 Malignant neoplasm of upper-outer quadrant of right female breast: Secondary | ICD-10-CM

## 2023-12-21 MED ORDER — ALENDRONATE SODIUM 70 MG PO TABS: 70.0000 mg | ORAL_TABLET | ORAL | 3 refills | Status: DC

## 2023-12-21 NOTE — Anesthesia Preprocedure Evaluation (Signed)
 Anesthesia Evaluation  Patient identified by MRN, date of birth, ID band Patient awake    Reviewed: Allergy & Precautions, NPO status , [name] Chart, lab work & pertinent test results  History of Anesthesia Complications (+) PONV and history of anesthetic complications  Airway Mallampati: III  TM Distance: >3 FB Neck ROM: Full    Dental no notable dental hx.    Pulmonary asthma    Pulmonary exam normal        Cardiovascular Normal cardiovascular exam+ dysrhythmias Atrial Fibrillation      Neuro/Psych  Headaches    GI/Hepatic negative GI ROS, Neg liver ROS,,,  Endo/Other  Hypothyroidism    Renal/GU negative Renal ROS  negative genitourinary   Musculoskeletal negative musculoskeletal ROS (+)    Abdominal   Peds  Hematology negative hematology ROS (+)   Anesthesia Other Findings Right breast ca  Reproductive/Obstetrics negative OB ROS                              Anesthesia Physical Anesthesia Plan  ASA: 2  Anesthesia Plan: MAC   Post-op Pain Management: Tylenol PO (pre-op)*   Induction:   PONV Risk Score and Plan: 3 and Propofol infusion, Treatment may vary due to age or medical condition, Ondansetron and Midazolam  Airway Management Planned: Natural Airway and Simple Face Mask  Additional Equipment: None  Intra-op Plan:   Post-operative Plan:   Informed Consent: I have reviewed the patients History and Physical, chart, labs and discussed the procedure including the risks, benefits and alternatives for the proposed anesthesia with the patient or authorized representative who has indicated his/her understanding and acceptance.       Plan Discussed with: CRNA  Anesthesia Plan Comments:         Anesthesia Quick Evaluation

## 2023-12-21 NOTE — Progress Notes (Signed)
      Enhanced Recovery after Surgery Patient was provided with CHG cleanser to use at home before the procedure. Patient verbalized understanding of instructions. Enhanced Recovery after Surgery is a protocol used to improve the stress on your body and your recovery after surgery.  Patient Instructions  The night before surgery:  No food after midnight. ONLY clear liquids after midnight  The day of surgery (if you do NOT have diabetes):  Drink ONE (1) Pre-Surgery Clear Ensure as directed.   This drink was given to you during your hospital  pre-op appointment visit. The pre-op nurse will instruct you on the time to drink the  Pre-Surgery Ensure depending on your surgery time. Finish the drink at the designated time by the pre-op nurse.  Nothing else to drink after completing the  Pre-Surgery Clear Ensure.  The day of surgery (if you have diabetes): Drink ONE (1) Gatorade 2 (G2) as directed. This drink was given to you during your hospital  pre-op appointment visit.  The pre-op nurse will instruct you on the time to drink the   Gatorade 2 (G2) depending on your surgery time. Color of the Gatorade may vary. Red is not allowed. Nothing else to drink after completing the  Gatorade 2 (G2).         If you have questions, please contact your surgeon's office.

## 2023-12-21 NOTE — Progress Notes (Signed)
 HEMATOLOGY-ONCOLOGY TELEPHONE VISIT PROGRESS NOTE  I connected with our patient on 12/21/23 at  3:00 PM EDT by telephone and verified that I am speaking with the correct person using two identifiers.  I discussed the limitations, risks, security and privacy concerns of performing an evaluation and management service by telephone and the availability of in person appointments.  I also discussed with the patient that there may be a patient responsible charge related to this service. The patient expressed understanding and agreed to proceed.   History of Present Illness: Follow-up to discuss results of guardant reveal for minimal residual disease showing positivity  History of Present Illness Caitlin [name] is a 68 year old female with breast cancer who presents for follow-up regarding recent test results and port removal.  She is concerned about a recent test showing a small blip in the DNA detected in her bloodstream. A whole body scan in February did not show any concerning findings.  She experiences ongoing fatigue, which she attributes to the residue from her previous treatments, including chemotherapy. No new or different symptoms since her last visit.    Oncology History  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female, estrogen receptor positive (HCC)  09/15/2022 Initial Diagnosis   Screening mammogram detected right breast mass which measured 1.4 cm by ultrasound at 10 o'clock position, biopsy revealed grade 3 IDC with lymphovascular invasion ER 10%, PR 0%, Ki-67 30%, HER2 0 negative   09/25/2022 Cancer Staging   Staging form: Breast, AJCC 8th Edition - Clinical: Stage IB (cT1c, cN0, cM0, G3, ER+, PR-, HER2-) - Signed by [name], MD on 09/25/2022 Histologic grading system: 3 grade system    Genetic Testing   [facility] CancerNext-Expanded Panel+RNA was Negative. Report date is 10/06/2021.  The CancerNext-Expanded gene panel offered by [facility] and  includes sequencing, rearrangement, and RNA analysis for the following 77 genes: AIP, ALK, APC, ATM, AXIN2, BAP1, BARD1, BLM, BMPR1A, BRCA1, BRCA2, BRIP1, CDC73, CDH1, CDK4, CDKN1B, CDKN2A, CHEK2, CTNNA1, DICER1, FANCC, FH, FLCN, GALNT12, KIF1B, LZTR1, MAX, MEN1, MET, MLH1, MSH2, MSH3, MSH6, MUTYH, NBN, NF1, NF2, NTHL1, PALB2, PHOX2B, PMS2, POT1, PRKAR1A, PTCH1, PTEN, RAD51C, RAD51D, RB1, RECQL, RET, SDHA, SDHAF2, SDHB, SDHC, SDHD, SMAD4, SMARCA4, SMARCB1, SMARCE1, STK11, SUFU, TMEM127, TP53, TSC1, TSC2, VHL and XRCC2 (sequencing and deletion/duplication); EGFR, EGLN1, HOXB13, KIT, MITF, PDGFRA, POLD1, and POLE (sequencing only); EPCAM and GREM1 (deletion/duplication only).    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 with high-grade DCIS, inferior margin positive, angiolymphatic invasion present, 5/5 lymph nodes positive, additional medial margin: Poorly differentiated carcinoma with lymphoid stroma, additional inferior margin: Positive, ER 10% weak, PR 0%, HER2 0, Ki-67 30%   10/21/2022 Cancer Staging   Staging form: Breast, AJCC 8th Edition - Pathologic: Stage IIIC (pT1c, pN2a, cM0, G3, ER-, PR-, HER2-) - Signed by [name], MD on 10/21/2022 Stage prefix: Initial diagnosis Histologic grading system: 3 grade system   11/08/2022 Surgery   Margin reexcision: Inferior margin: Grade 3 IDC, new margin positive Medial margin: Grade 3 IDC new margin widely involved   11/18/2022 Surgery   Right inferior margin reexcision: Invasive poorly differentiated adenocarcinoma grade 3 present in new inferior margin.  Medial margin reexcision: Invasive poorly differentiated adenocarcinoma grade 3 present at new medial margin, posterior margin: Poorly differentiated adenocarcinoma grade 3, margin negative  11/08/2022 and 11/18/2022: Final margin focal positivity. After much discussion the plan is to finish her systemic treatment  and come back at a later point to do mastectomy.    12/02/2022 - 04/18/2023 Chemotherapy   Patient is on Treatment Plan : BREAST ADJUVANT DOSE DENSE AC q14d / PACLitaxel q7d     05/03/2023 Surgery   Right mastectomy: 8 cm grade 3 IDC with extensive lymphatic and vascular invasion RCB class III, deep margin multifocally positive, 5/5 lymph nodes positive, ER weak 10%, PR 0%, HER2 0, Ki-67 30%   05/25/2023 - 08/17/2023 Chemotherapy   Patient is on Treatment Plan : BREAST Pembrolizumab (200) q21d x 24 months     05/25/2023 -  Adjuvant Chemotherapy   Capectiabine 1000mg  po BID 2 weeks on, 1 week off; began 05/25/2023, second cycle delayed to 07/06/2023   06/08/2023 - 07/21/2023 Radiation Therapy   First Treatment Date: 2023-06-08 - Last Treatment Date: 2023-07-21   Plan Name: CW_R_BO Site: Chest Wall, Right Technique: 3D Mode: Photon Dose Per Fraction: 2 Gy Prescribed Dose (Delivered / Prescribed): 26 Gy / 26 Gy Prescribed Fxs (Delivered / Prescribed): 13 / 13   Plan Name: CW_R_SCV_PAB Site: Chest Wall, Right Technique: 3D Mode: Photon Dose Per Fraction: 2 Gy Prescribed Dose (Delivered / Prescribed): 50 Gy / 50 Gy Prescribed Fxs (Delivered / Prescribed): 25 / 25   Plan Name: CW_R_Bst_BO Site: Chest Wall, Right Technique: Electron Mode: Electron Dose Per Fraction: 2 Gy Prescribed Dose (Delivered / Prescribed): 14 Gy / 14 Gy Prescribed Fxs (Delivered / Prescribed): 7 / 7   Plan Name: CW_R Site: Chest Wall, Right Technique: 3D Mode: Photon Dose Per Fraction: 2 Gy Prescribed Dose (Delivered / Prescribed): 24 Gy / 24 Gy Prescribed Fxs (Delivered / Prescribed): 12 / 12     REVIEW OF SYSTEMS:   Constitutional: Denies fevers, chills or abnormal weight loss All other systems were reviewed with the patient and are negative. Observations/Objective:     Assessment Plan:  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female, estrogen receptor positive  (HCC) 10/14/2022:Right lumpectomy: 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  No distant metastatic disease. Bone scan 10/29/2022: No evidence of bone metastasis   11/08/2022 and 11/18/2022: Margin reexcision surgeries: Final margin focal positivity.  After much discussion the plan is to finish her systemic treatment and come back at a later point to do mastectomy.   Treatment summary: Adjuvant chemotherapy with dose dense Adriamycin and Cytoxan followed by Taxol to be completed 05/02/2023, Keytruda 05/25/2023-08/17/2023 (discontinued due to immune mediated adverse effects: Skin rash) Mastectomy by Dr. Dwain Sarna 05/03/2023:Right mastectomy: 8 cm grade 3 IDC with extensive lymphatic and vascular invasion RCB class III, deep margin multifocally positive, 5/5 lymph nodes positive, ER weak 10%, PR 0%, HER2 0, Ki-67 30%, total lymph nodes: 10/10 (prior lumpectomy she had 5 positive nodes) Adjuvant radiation to start 06/09/2023-07/21/23 Capecitabine started 05/25/2023-11/25/2023 ------------------------------------------------------------------------------------------------------------------------------------------------ MRI of the liver 06/12/23: Benign MRI brain 08/02/2023: Benign CT CAP and bone scan 11/23/2023: New clustered irregular nodules posterior left upper lobe 10 mm favored infectious/inflammatory, new subpleural reticulations anterior right upper lobe postradiation.  No metastatic disease    Guardant reveal 12/14/2023: CT  DNA detected 2.7% Repeat scans every 3 months for monitoring. Bone density 12/21/2023: Osteoporosis T-score -2.8: Recommended calcium vitamin D and Fosamax Port will be taken out tomorrow Assessment & Plan Malignant neoplasm of upper-outer quadrant of right breast, estrogen receptor negative Estrogen receptor-negative breast cancer with recent DNA test likely false positive or residual. February scan showed no concerns. Maximal treatment received. - Repeat whole-body scan in May. - Recheck DNA test in three months. - Proceed with port removal.  Osteoporosis Osteoporosis confirmed with T-score of -2.8. Fosamax discussed with emphasis on dental health and administration instructions. Small benefit in reducing breast cancer risk. - Prescribe Fosamax once weekly. - Ensure dental health is adequate before starting Fosamax. - Advise taking Fosamax on an empty stomach with water and remain upright for 30 minutes. - Recommend calcium with vitamin D supplementation, 500 mg calcium twice daily. - Send prescription to [facility].      I discussed the assessment and treatment plan with the patient. The patient was provided an opportunity to ask questions and all were answered. The patient agreed with the plan and demonstrated an understanding of the instructions. The patient was advised to call back or seek an in-person evaluation if the symptoms worsen or if the condition fails to improve as anticipated.   I provided 20 minutes of non-face-to-face time during this encounter.  This includes time for charting and coordination of care   Tamsen Meek, MD

## 2023-12-21 NOTE — Assessment & Plan Note (Signed)
 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  No distant metastatic disease. Bone scan 10/29/2022: No evidence of bone metastasis   11/08/2022 and 11/18/2022: Margin reexcision surgeries: Final margin focal positivity.  After much discussion the plan is to finish her systemic treatment and come back at a later point to do mastectomy.   Treatment summary: Adjuvant chemotherapy with dose dense Adriamycin and Cytoxan followed by Taxol to be completed 05/02/2023, Keytruda 05/25/2023-08/17/2023 (discontinued due to immune mediated adverse effects: Skin rash) Mastectomy by Dr. Dwain Sarna 05/03/2023:Right mastectomy: 8 cm grade 3 IDC with extensive lymphatic and vascular invasion RCB class III, deep margin multifocally positive, 5/5 lymph nodes positive, ER weak 10%, PR 0%, HER2 0, Ki-67 30%, total lymph nodes: 10/10 (prior lumpectomy she had 5 positive nodes) Adjuvant radiation to start 06/09/2023-07/21/23 Capecitabine started 05/25/2023-11/25/2023 ------------------------------------------------------------------------------------------------------------------------------------------------ MRI of the liver 06/12/23: Benign MRI brain 08/02/2023: Benign CT CAP and bone scan 11/23/2023: New clustered irregular nodules posterior left upper lobe 10 mm favored infectious/inflammatory, new subpleural reticulations anterior right upper lobe postradiation.  No metastatic disease   Guardant  reveal 12/14/2023: CT DNA detected 2.7% Repeat scans every 3 months for monitoring.

## 2023-12-22 ENCOUNTER — Ambulatory Visit (HOSPITAL_BASED_OUTPATIENT_CLINIC_OR_DEPARTMENT_OTHER): Payer: Self-pay | Admitting: Anesthesiology

## 2023-12-22 ENCOUNTER — Other Ambulatory Visit: Payer: Self-pay

## 2023-12-22 ENCOUNTER — Ambulatory Visit (HOSPITAL_BASED_OUTPATIENT_CLINIC_OR_DEPARTMENT_OTHER)
Admission: RE | Admit: 2023-12-22 | Discharge: 2023-12-22 | Disposition: A | Discharge status: Home / Self Care | Source: Ambulatory Visit | Attending: General Surgery | Admitting: General Surgery

## 2023-12-22 ENCOUNTER — Telehealth: Payer: Self-pay | Admitting: Hematology and Oncology

## 2023-12-22 ENCOUNTER — Encounter (HOSPITAL_BASED_OUTPATIENT_CLINIC_OR_DEPARTMENT_OTHER): Payer: Self-pay | Admitting: General Surgery

## 2023-12-22 ENCOUNTER — Encounter (HOSPITAL_BASED_OUTPATIENT_CLINIC_OR_DEPARTMENT_OTHER)
Admission: RE | Disposition: A | Discharge status: Home / Self Care | Payer: Self-pay | Source: Ambulatory Visit | Attending: General Surgery

## 2023-12-22 ENCOUNTER — Inpatient Hospital Stay: Admitting: Hematology and Oncology

## 2023-12-22 DIAGNOSIS — C50911 Malignant neoplasm of unspecified site of right female breast: Secondary | ICD-10-CM | POA: Insufficient documentation

## 2023-12-22 DIAGNOSIS — E039 Hypothyroidism, unspecified: Secondary | ICD-10-CM | POA: Diagnosis not present

## 2023-12-22 DIAGNOSIS — Z452 Encounter for adjustment and management of vascular access device: Secondary | ICD-10-CM | POA: Insufficient documentation

## 2023-12-22 DIAGNOSIS — I4891 Unspecified atrial fibrillation: Secondary | ICD-10-CM | POA: Insufficient documentation

## 2023-12-22 DIAGNOSIS — Z8249 Family history of ischemic heart disease and other diseases of the circulatory system: Secondary | ICD-10-CM | POA: Diagnosis not present

## 2023-12-22 DIAGNOSIS — J45909 Unspecified asthma, uncomplicated: Secondary | ICD-10-CM | POA: Insufficient documentation

## 2023-12-22 DIAGNOSIS — Z170 Estrogen receptor positive status [ER+]: Secondary | ICD-10-CM | POA: Diagnosis not present

## 2023-12-22 HISTORY — PX: PORT-A-CATH REMOVAL: SHX5289

## 2023-12-22 SURGERY — REMOVAL PORT-A-CATH
Anesthesia: Monitor Anesthesia Care | Site: Chest | Laterality: Left

## 2023-12-22 MED ORDER — ACETAMINOPHEN 500 MG PO TABS
1000.0000 mg | ORAL_TABLET | ORAL | Status: AC
Start: 2023-12-22 — End: 2023-12-22

## 2023-12-22 MED ORDER — BUPIVACAINE HCL (PF) 0.25 % IJ SOLN
INTRAMUSCULAR | Status: DC | PRN
  Administered 2023-12-22: 10 mL

## 2023-12-22 MED ORDER — DEXAMETHASONE SODIUM PHOSPHATE 10 MG/ML IJ SOLN
INTRAMUSCULAR | Status: AC
  Filled 2023-12-22: qty 2

## 2023-12-22 MED ORDER — OXYCODONE HCL 5 MG/5ML PO SOLN: 5.0000 mg | Freq: Once | ORAL | Status: DC | PRN

## 2023-12-22 MED ORDER — OXYCODONE HCL 5 MG PO TABS: 5.0000 mg | ORAL_TABLET | Freq: Once | ORAL | Status: DC | PRN

## 2023-12-22 MED ORDER — LIDOCAINE 2% (20 MG/ML) 5 ML SYRINGE
INTRAMUSCULAR | Status: AC
  Filled 2023-12-22: qty 5

## 2023-12-22 MED ORDER — FENTANYL CITRATE (PF) 100 MCG/2ML IJ SOLN: 25.0000 ug | INTRAMUSCULAR | Status: DC | PRN

## 2023-12-22 MED ORDER — CHLORHEXIDINE GLUCONATE CLOTH 2 % EX PADS: 6.0000 | MEDICATED_PAD | Freq: Once | CUTANEOUS | Status: DC

## 2023-12-22 MED ORDER — ACETAMINOPHEN 500 MG PO TABS
1000.0000 mg | ORAL_TABLET | Freq: Once | ORAL | Status: AC
  Administered 2023-12-22: 1000 mg via ORAL

## 2023-12-22 MED ORDER — ACETAMINOPHEN 500 MG PO TABS
ORAL_TABLET | ORAL | Status: AC
  Filled 2023-12-22: qty 2

## 2023-12-22 MED ORDER — DROPERIDOL 2.5 MG/ML IJ SOLN: 0.6250 mg | Freq: Once | INTRAMUSCULAR | Status: DC | PRN

## 2023-12-22 MED ORDER — LIDOCAINE 2% (20 MG/ML) 5 ML SYRINGE
INTRAMUSCULAR | Status: DC | PRN
  Administered 2023-12-22: 60 mg via INTRAVENOUS

## 2023-12-22 MED ORDER — MIDAZOLAM HCL 2 MG/2ML IJ SOLN
INTRAMUSCULAR | Status: DC | PRN
  Administered 2023-12-22: 2 mg via INTRAVENOUS

## 2023-12-22 MED ORDER — MIDAZOLAM HCL 2 MG/2ML IJ SOLN
INTRAMUSCULAR | Status: AC
  Filled 2023-12-22: qty 2

## 2023-12-22 MED ORDER — ONDANSETRON HCL 4 MG/2ML IJ SOLN
INTRAMUSCULAR | Status: DC | PRN
  Administered 2023-12-22: 4 mg via INTRAVENOUS

## 2023-12-22 MED ORDER — PROPOFOL 10 MG/ML IV BOLUS
INTRAVENOUS | Status: AC
  Filled 2023-12-22: qty 20

## 2023-12-22 MED ORDER — ONDANSETRON HCL 4 MG/2ML IJ SOLN
INTRAMUSCULAR | Status: AC
  Filled 2023-12-22: qty 2

## 2023-12-22 MED ORDER — PROPOFOL 500 MG/50ML IV EMUL
INTRAVENOUS | Status: DC | PRN
  Administered 2023-12-22: 20 ug via INTRAVENOUS
  Administered 2023-12-22: 100 ug/kg/min via INTRAVENOUS

## 2023-12-22 MED ORDER — LACTATED RINGERS IV SOLN: INTRAVENOUS | Status: DC

## 2023-12-22 MED ORDER — CEFAZOLIN SODIUM-DEXTROSE 2-4 GM/100ML-% IV SOLN
INTRAVENOUS | Status: AC
  Filled 2023-12-22: qty 100

## 2023-12-22 SURGICAL SUPPLY — 27 items
BLADE SURG 15 STRL LF DISP TIS (BLADE) ×1 IMPLANT
CHLORAPREP W/TINT 26 (MISCELLANEOUS) ×1 IMPLANT
COVER BACK TABLE 60X90IN (DRAPES) ×1 IMPLANT
COVER MAYO STAND STRL (DRAPES) ×1 IMPLANT
DERMABOND ADVANCED .7 DNX12 (GAUZE/BANDAGES/DRESSINGS) ×1 IMPLANT
DRAPE LAPAROTOMY 100X72 PEDS (DRAPES) ×1 IMPLANT
DRAPE UTILITY XL STRL (DRAPES) ×1 IMPLANT
ELECT COATED BLADE 2.86 ST (ELECTRODE) ×1 IMPLANT
ELECT REM PT RETURN 9FT ADLT (ELECTROSURGICAL) ×1 IMPLANT
ELECTRODE REM PT RTRN 9FT ADLT (ELECTROSURGICAL) ×1 IMPLANT
GLOVE BIO SURGEON STRL SZ7 (GLOVE) ×1 IMPLANT
GLOVE BIOGEL PI IND STRL 7.5 (GLOVE) ×1 IMPLANT
GLOVE SURG SYN 7.5 E (GLOVE) ×1 IMPLANT
GLOVE SURG SYN 7.5 PF PI (GLOVE) IMPLANT
GOWN STRL REUS W/ TWL LRG LVL3 (GOWN DISPOSABLE) ×2 IMPLANT
NDL HYPO 25X1 1.5 SAFETY (NEEDLE) ×1 IMPLANT
NEEDLE HYPO 25X1 1.5 SAFETY (NEEDLE) ×1 IMPLANT
PACK BASIN DAY SURGERY FS (CUSTOM PROCEDURE TRAY) ×1 IMPLANT
PENCIL SMOKE EVACUATOR (MISCELLANEOUS) ×1 IMPLANT
SLEEVE SCD COMPRESS KNEE MED (STOCKING) IMPLANT
SPIKE FLUID TRANSFER (MISCELLANEOUS) ×1 IMPLANT
SPONGE T-LAP 4X18 ~~LOC~~+RFID (SPONGE) ×1 IMPLANT
STRIP CLOSURE SKIN 1/2X4 (GAUZE/BANDAGES/DRESSINGS) ×1 IMPLANT
SUT MNCRL AB 4-0 PS2 18 (SUTURE) ×1 IMPLANT
SUT VIC AB 3-0 SH 27X BRD (SUTURE) ×1 IMPLANT
SYR CONTROL 10ML LL (SYRINGE) ×1 IMPLANT
TOWEL GREEN STERILE FF (TOWEL DISPOSABLE) ×1 IMPLANT

## 2023-12-22 NOTE — H&P (Signed)
 Caitlin [name] is an 68 y.o. female.   Chief Complaint: breast cancer HPI: 61 yof completed therapy for breast cancer, no longer needs port. Here for removal  Past Medical History:  Diagnosis Date   Asthma    Complication of anesthesia    Dyspnea    Dysrhythmia    2012   Headache    History of kidney stones    passed   Hypothyroidism    Pneumonia 01/26/2023   PONV (postoperative nausea and vomiting)    rt breast ca 08/2022   Right Breast   Scoliosis    Thyroid disease     Past Surgical History:  Procedure Laterality Date   APPENDECTOMY     AXILLARY SENTINEL NODE BIOPSY Right 10/14/2022   Procedure: RIGHT AXILLARY SENTINEL NODE BIOPSY;  Surgeon: [name], MD;  Location: [facility] OR;  Service: General;  Laterality: Right;   BREAST BIOPSY Right 09/15/2022   [place] RT BREAST BX W LOC DEV 1ST LESION IMG BX SPEC US GUIDE 09/15/2022 [facility]   BREAST BIOPSY  10/12/2022   [place] RT RADIOACTIVE SEED LOC 10/12/2022 [facility]MO[facility]   BREAST LUMPECTOMY WITH RADIOACTIVE SEED AND SENTINEL LYMPH NODE BIOPSY Right 10/14/2022   Procedure: RIGHT BREAST LUMPECTOMY WITH RADIOACTIVE SEED;  Surgeon: [name], MD;  Location: [facility] OR;  Service: General;  Laterality: Right;   COLONOSCOPY     LAPAROSCOPIC APPENDECTOMY     PORT-A-CATH REMOVAL Left 05/03/2023   Procedure: REMOVAL PORT-A-CATH;  Surgeon: [name], MD;  Location: [facility] OR;  Service: General;  Laterality: Left;   PORTACATH PLACEMENT Left 10/14/2022   Procedure: INSERTION PORT-A-CATH;  Surgeon: [name], MD;  Location: [facility] OR;  Service: General;  Laterality: Left;   PORTACATH PLACEMENT Left 05/19/2023   Procedure: PORT PLACEMENT WITH ULTRASOUND GUIDANCE;  Surgeon: [name], MD;  Location: [facility];  Service: General;  Laterality: Left;   RE-EXCISION OF BREAST LUMPECTOMY Right 11/08/2022   Procedure: RE-EXCISION OF RIGHT BREAST LUMPECTOMY;  Surgeon: [name], MD;  Location:  [facility];  Service: General;  Laterality: Right;   RE-EXCISION OF BREAST LUMPECTOMY Right 11/18/2022   Procedure: RE-EXCISION OF RIGHT BREAST LUMPECTOMY;  Surgeon: [name], MD;  Location: [facility] OR;  Service: General;  Laterality: Right;   SIMPLE MASTECTOMY WITH AXILLARY SENTINEL NODE BIOPSY Right 05/03/2023   Procedure: RIGHT MASTECTOMY;  Surgeon: [name], MD;  Location: [facility] OR;  Service: General;  Laterality: Right;  GEN & PEC BLOCK   WISDOM TOOTH EXTRACTION      Family History  Problem Relation Age of Onset   Thyroid disease Mother    Prostate cancer Father 8       metastatic   Hypertension Father    Thyroid disease Sister        twin sister age 86   Asthma Sister    Prostate cancer Brother 60   Heart attack Maternal Grandmother        died age 72's   Heart attack Maternal Grandfather        died age 2's   Clotting disorder Paternal Grandmother        cerebral hemorrage   Social History:  reports that she has never smoked. She does not have any smokeless tobacco history on file. She reports that she does not currently use alcohol. She reports that she does not use drugs.  Allergies:  Allergies  Allergen Reactions   Penicillins Rash    childhood reaction    Medications  Prior to Admission  Medication Sig Dispense Refill   albuterol (VENTOLIN HFA) 108 (90 Base) MCG/ACT inhaler Inhale 2 puffs into the lungs every 4 (four) hours as needed for shortness of breath (Asthma).     aspirin 81 MG tablet Take 81 mg by mouth daily.       diltiazem (CARDIZEM CD) 120 MG 24 hr capsule Take 120 mg by mouth daily.     levothyroxine (SYNTHROID) 75 MCG tablet Take 75 mcg by mouth daily before breakfast.     Multiple Vitamins-Minerals (MULTIVITAMIN WITH MINERALS) tablet Take 1 tablet by mouth daily.     OVER THE COUNTER MEDICATION Take 1 capsule by mouth 2 (two) times daily. copaiba softgels     PULMICORT FLEXHALER 180 MCG/ACT inhaler Inhale 1 puff into the  lungs daily.     alendronate (FOSAMAX) 70 MG tablet Take 1 tablet (70 mg total) by mouth once a week. Take with a full glass of water on an empty stomach. 12 tablet 3    No results found for this or any previous visit (from the past 48 hours). DG Bone Density Result Date: 12/21/2023 EXAM: DUAL X-RAY ABSORPTIOMETRY (DXA) FOR BONE MINERAL DENSITY IMPRESSION: Referring Physician:  [name] Your patient completed a bone mineral density test using GE Lunar iDXA system (analysis version: 16). Technologist: ALW PATIENT: Name: Caitlin [name], Caitlin [name] Patient ID: [id number] Birth Date: 07-06-1956 Height: 63.0 in. Sex: Female Measured: 12/21/2023 Weight: 155.0 lbs. Indications: Breast Cancer History, Caucasian, Estrogen Deficiency, Family Hist. (Parent hip fracture), History of Fracture (Adult), Hypothyroid, Levothyroxine, Post Menopausal Fractures: Rib Treatments: Multivitamin ASSESSMENT: The BMD measured at DualFemur Neck Right is 0.647 g/cm2 with a T-score of -2.8. This patient is considered osteoporotic according to World Health Organization [facility]) criteria. Scan quality is good. Exclusions: Lumbar Spine excluded due to degenerative changes. Site Region Measured Date Measured Age YA BMD Significant CHANGE T-score DualFemur Neck Right 12/21/2023 66.3 -2.8 0.647 g/cm2 DualFemur Total Mean 12/21/2023 66.3 -2.1 0.738 g/cm2 Left Forearm Radius 33% 12/21/2023 66.3 -2.5 0.661 g/cm2 World Health Organization [facility]) criteria for post-menopausal, Caucasian Women: Normal       T-score at or above -1 SD Osteopenia   T-score between -1 and -2.5 SD Osteoporosis T-score at or below -2.5 SD RECOMMENDATION: 1. All patients should optimize calcium and vitamin D intake. 2. Consider FDA approved medical therapies in postmenopausal women and men aged 58 years and older, based on the following: a. A hip or vertebral (clinical or morphometric) fracture b. T-score = -2.5 at the femoral neck or spine after appropriate evaluation to exclude  secondary causes c. Low bone mass (T-score between -1.0 and -2.5 at the femoral neck or spine) and a 10- year probability of a hip fracture = 3% or a 10 year probability of a major osteoporosis-related fracture = 20% based on the [place]-adapted WHO algorithm. 3. Clinician judgement and/or patient preference may indicate treatment for people with10-year fracture probabilities above or below these levels. FOLLOW-UP: Patients with diagnosis of osteoporosis or at high risk for fracture should have regular bone mineral density tests. For patients eligible for Medicare routine testing is allowed once every 2 years. The testing frequency can be increased to one year for patients who have rapidly progressing disease, those who are receiving or discontinuing medical therapy to restore bone mass, or have additional risk factors. I have reviewed this study and agree with the findings. [facility] Electronically Signed   By: Baird Lyons M.D.   On: 12/21/2023 13:51  Review of Systems  All other systems reviewed and are negative.   Blood pressure 123/74, pulse 71, temperature 97.9 F (36.6 C), temperature source Temporal, resp. rate 14, height 5\' 3"  (1.6 m), weight 72.2 kg, SpO2 100%. Physical Exam Constitutional:      Appearance: Normal appearance.  Cardiovascular:     Rate and Rhythm: Normal rate.  Pulmonary:     Effort: Pulmonary effort is normal.  Neurological:     Mental Status: She is alert.      Assessment/Plan Breast cancer, no longer needs venous access Port removal under Harriette Bouillon, MD 12/22/2023, 8:14 AM

## 2023-12-22 NOTE — Anesthesia Postprocedure Evaluation (Signed)
 Anesthesia Post Note  Patient: Caitlin [name]  Procedure(s) Performed: REMOVAL PORT-A-CATH (Left: Chest)     Patient location during evaluation: PACU Anesthesia Type: MAC Level of consciousness: awake and alert Pain management: pain level controlled Vital Signs Assessment: post-procedure vital signs reviewed and stable Respiratory status: spontaneous breathing, nonlabored ventilation and respiratory function stable Cardiovascular status: blood pressure returned to baseline Postop Assessment: no apparent nausea or vomiting Anesthetic complications: no   No notable events documented.  Last Vitals:  Vitals:   12/22/23 0915 12/22/23 0917  BP: 100/71   Pulse: 68 69  Resp: (!) 21 14  Temp:    SpO2: 90% 99%    Last Pain:  Vitals:   12/22/23 0902  TempSrc:   PainSc: 0-No pain                 Shanda Howells

## 2023-12-22 NOTE — Transfer of Care (Signed)
 Immediate Anesthesia Transfer of Care Note  Patient: Caitlin [name]  Procedure(s) Performed: REMOVAL PORT-A-CATH (Left: Chest)  Patient Location: PACU  Anesthesia Type:MAC  Level of Consciousness: awake  Airway & Oxygen Therapy: Patient Spontanous Breathing and Patient connected to face mask oxygen  Post-op Assessment: Report given to RN and Post -op Vital signs reviewed and stable  Post vital signs: Reviewed and stable  Last Vitals:  Vitals Value Taken Time  BP 97/62 12/22/23 0901  Temp    Pulse 69 12/22/23 0902  Resp 12 12/22/23 0902  SpO2 99 % 12/22/23 0902  Vitals shown include unfiled device data.  Last Pain:  Vitals:   12/22/23 0737  TempSrc: Temporal  PainSc: 0-No pain      Patients Stated Pain Goal: 1 (12/22/23 0737)  Complications: No notable events documented.

## 2023-12-22 NOTE — Interval H&P Note (Signed)
 History and Physical Interval Note:  12/22/2023 8:21 AM  Caitlin [name]  has presented today for surgery, with the diagnosis of RIGHT BREAST CANCER.  The various methods of treatment have been discussed with the patient and family. After consideration of risks, benefits and other options for treatment, the patient has consented to  Procedure(s): REMOVAL PORT-A-CATH (N/A) as a surgical intervention.  The patient's history has been reviewed, patient examined, no change in status, stable for surgery.  I have reviewed the patient's chart and labs.  Questions were answered to the patient's satisfaction.     [name]

## 2023-12-22 NOTE — Op Note (Signed)
 Preoperative diagnosis: Breast cancer, no longer needs venous access Postoperative diagnosis: Same as above Procedure: Left internal jugular port removal Surgeon: Dr. Harden Mo Anesthesia: Local with sedation Complications: Drains: None Estimated blood loss: Minimal Sponge needle count correct at completion Disposition recovery stable addition   Indications: This is a 57 yof who has undergone therapy for breast cancer. Desires port removal.   Procedure: After informed consent was obtained she was taken to the operating  room.  She was placed under monitored anesthesia care.  SCDs were in place.  She was prepped and draped in a standard sterile surgical fashion.  Surgical timeout was then performed.  I infiltrated Marcaine over her old incision overlying the port. I reentered the old incision. I removed the port, line and suture material in their entirety.   I then was able to remove the port from the pocket and divide the suture.  Hemostasis was observed. I then closed with 3-0 vicyrl, 4-0 monocryl and glue She tolerated well and was transferred to pacu stable.

## 2023-12-22 NOTE — Telephone Encounter (Signed)
 Scheduled appointment per 3/19 los. Talked with the patient and she is aware of the made appointment.

## 2023-12-22 NOTE — Discharge Instructions (Addendum)
    PORT-A-CATH: POST OP INSTRUCTIONS  Take your usually prescribed medications unless otherwise directed. You should follow a light diet for the remainder of the day after your procedure. Most patients will experience some mild swelling and/or bruising in the area of the incision. It may take several days to resolve. It is common to experience some constipation if taking pain medication after surgery. Increasing fluid intake and taking a stool softener (such as Colace) will usually help or prevent this problem from occurring. A mild laxative (Milk of Magnesia or Miralax) should be taken according to package directions if there are no bowel movements after 48 hours.  I used Dermabond (skin glue) on the incision, you may shower in 24 hours.  The glue and tape strip will flake off over the next 2-3 weeks.  ACTIVITIES:  resume normal activity as tolerated   WHEN TO CALL YOUR DOCTOR [phone]): Fever over 101.0 Chills Continued bleeding from incision Increased redness and tenderness at the site Shortness of breath, difficulty breathing   The clinic staff is available to answer your questions during regular business hours. Please don't hesitate to call and ask to speak to one of the nurses or medical assistants for clinical concerns. If you have a medical emergency, go to the nearest emergency room or call 911.  A surgeon from [facility] is always on call at the hospital.     For further information, please visit [email or web address]    May take Tylenol after 1:40p today if needed.   Post Anesthesia Home Care Instructions  Activity: Get plenty of rest for the remainder of the day. A responsible individual must stay with you for 24 hours following the procedure.  For the next 24 hours, DO NOT: -Drive a car -Advertising copywriter -Drink alcoholic beverages -Take any medication unless instructed by your physician -Make any legal decisions or sign important  papers.  Meals: Start with liquid foods such as gelatin or soup. Progress to regular foods as tolerated. Avoid greasy, spicy, heavy foods. If nausea and/or vomiting occur, drink only clear liquids until the nausea and/or vomiting subsides. Call your physician if vomiting continues.  Special Instructions/Symptoms: Your throat may feel dry or sore from the anesthesia or the breathing tube placed in your throat during surgery. If this causes discomfort, gargle with warm salt water. The discomfort should disappear within 24 hours.  If you had a scopolamine patch placed behind your ear for the management of post- operative nausea and/or vomiting:  1. The medication in the patch is effective for 72 hours, after which it should be removed.  Wrap patch in a tissue and discard in the trash. Wash hands thoroughly with soap and water. 2. You may remove the patch earlier than 72 hours if you experience unpleasant side effects which may include dry mouth, dizziness or visual disturbances. 3. Avoid touching the patch. Wash your hands with soap and water after contact with the patch.

## 2023-12-23 ENCOUNTER — Encounter (HOSPITAL_BASED_OUTPATIENT_CLINIC_OR_DEPARTMENT_OTHER): Payer: Self-pay | Admitting: General Surgery

## 2024-01-03 ENCOUNTER — Encounter: Payer: Self-pay | Admitting: Hematology and Oncology

## 2024-01-09 ENCOUNTER — Ambulatory Visit
Admission: RE | Admit: 2024-01-09 | Discharge: 2024-01-09 | Disposition: A | Discharge status: Home / Self Care | Source: Ambulatory Visit | Attending: Hematology and Oncology

## 2024-01-09 DIAGNOSIS — C50411 Malignant neoplasm of upper-outer quadrant of right female breast: Secondary | ICD-10-CM

## 2024-01-09 HISTORY — DX: Malignant neoplasm of unspecified site of unspecified female breast: C50.919

## 2024-01-11 ENCOUNTER — Encounter

## 2024-01-30 ENCOUNTER — Encounter: Payer: Self-pay | Admitting: Hematology and Oncology

## 2024-02-13 ENCOUNTER — Ambulatory Visit: Payer: BC Managed Care – PPO

## 2024-02-14 ENCOUNTER — Other Ambulatory Visit: Payer: Self-pay | Admitting: *Deleted

## 2024-02-14 DIAGNOSIS — C50411 Malignant neoplasm of upper-outer quadrant of right female breast: Secondary | ICD-10-CM

## 2024-02-15 ENCOUNTER — Ambulatory Visit: Attending: General Surgery

## 2024-02-15 ENCOUNTER — Inpatient Hospital Stay: Attending: Hematology and Oncology

## 2024-02-15 ENCOUNTER — Ambulatory Visit (HOSPITAL_COMMUNITY)
Admission: RE | Admit: 2024-02-15 | Discharge: 2024-02-15 | Disposition: A | Discharge status: Home / Self Care | Source: Ambulatory Visit | Attending: Hematology and Oncology | Admitting: Hematology and Oncology

## 2024-02-15 DIAGNOSIS — R590 Localized enlarged lymph nodes: Secondary | ICD-10-CM | POA: Diagnosis not present

## 2024-02-15 DIAGNOSIS — Z79899 Other long term (current) drug therapy: Secondary | ICD-10-CM | POA: Diagnosis not present

## 2024-02-15 DIAGNOSIS — Z1722 Progesterone receptor negative status: Secondary | ICD-10-CM | POA: Insufficient documentation

## 2024-02-15 DIAGNOSIS — Z9011 Acquired absence of right breast and nipple: Secondary | ICD-10-CM | POA: Diagnosis not present

## 2024-02-15 DIAGNOSIS — Z170 Estrogen receptor positive status [ER+]: Secondary | ICD-10-CM | POA: Insufficient documentation

## 2024-02-15 DIAGNOSIS — Z483 Aftercare following surgery for neoplasm: Secondary | ICD-10-CM | POA: Insufficient documentation

## 2024-02-15 DIAGNOSIS — C50411 Malignant neoplasm of upper-outer quadrant of right female breast: Secondary | ICD-10-CM | POA: Insufficient documentation

## 2024-02-15 DIAGNOSIS — Z1732 Human epidermal growth factor receptor 2 negative status: Secondary | ICD-10-CM | POA: Insufficient documentation

## 2024-02-15 LAB — CMP (CANCER CENTER ONLY)
ALT: 13 U/L (ref 0–44)
AST: 16 U/L (ref 15–41)
Albumin: 4.1 g/dL (ref 3.5–5.0)
Alkaline Phosphatase: 79 U/L (ref 38–126)
Anion gap: 3 — ABNORMAL LOW (ref 5–15)
BUN: 14 mg/dL (ref 8–23)
CO2: 30 mmol/L (ref 22–32)
Calcium: 9.3 mg/dL (ref 8.9–10.3)
Chloride: 106 mmol/L (ref 98–111)
Creatinine: 0.96 mg/dL (ref 0.44–1.00)
GFR, Estimated: >60 mL/min (ref >60)
Glucose, Bld: 70 mg/dL (ref 70–99)
Potassium: 4.1 mmol/L (ref 3.5–5.1)
Sodium: 139 mmol/L (ref 135–145)
Total Bilirubin: 0.3 mg/dL (ref 0.0–1.2)
Total Protein: 6.9 g/dL (ref 6.5–8.1)

## 2024-02-15 LAB — CBC WITH DIFFERENTIAL (CANCER CENTER ONLY)
Abs Immature Granulocytes: 0.06 10*3/uL (ref 0.00–0.07)
Basophils Absolute: 0.1 10*3/uL (ref 0.0–0.1)
Basophils Relative: 1 %
Eosinophils Absolute: 2.5 10*3/uL — ABNORMAL HIGH (ref 0.0–0.5)
Eosinophils Relative: 27 %
HCT: 38.6 % (ref 36.0–46.0)
Hemoglobin: 13 g/dL (ref 12.0–15.0)
Immature Granulocytes: 1 %
Lymphocytes Relative: 13 %
Lymphs Abs: 1.2 10*3/uL (ref 0.7–4.0)
MCH: 32.8 pg (ref 26.0–34.0)
MCHC: 33.7 g/dL (ref 30.0–36.0)
MCV: 97.5 fL (ref 80.0–100.0)
Monocytes Absolute: 0.8 10*3/uL (ref 0.1–1.0)
Monocytes Relative: 9 %
Neutro Abs: 4.7 10*3/uL (ref 1.7–7.7)
Neutrophils Relative %: 49 %
Platelet Count: 319 10*3/uL (ref 150–400)
RBC: 3.96 MIL/uL (ref 3.87–5.11)
RDW: 13.7 % (ref 11.5–15.5)
Smear Review: NORMAL
WBC Count: 9.4 10*3/uL (ref 4.0–10.5)
nRBC: 0 % (ref 0.0–0.2)

## 2024-02-15 MED ORDER — IOHEXOL 300 MG/ML  SOLN
100.0000 mL | Freq: Once | INTRAMUSCULAR | Status: AC | PRN
  Administered 2024-02-15: 100 mL via INTRAVENOUS

## 2024-02-15 MED ORDER — SODIUM CHLORIDE (PF) 0.9 % IJ SOLN
INTRAMUSCULAR | Status: AC
Start: 2024-02-15 — End: ?
  Filled 2024-02-15: qty 50

## 2024-02-15 NOTE — Therapy (Signed)
 OUTPATIENT PHYSICAL THERAPY SOZO SCREENING NOTE   Patient Name: Caitlin [name] MRN: [id number] DOB:01/09/1956, 68 y.o., female Today's Date: 02/15/2024  PCP: Leesa Pulling, MD REFERRING PROVIDER: Enid Harry, MD   PT End of Session - 02/15/24 1050     Visit Number 2   unchanged due to screen   PT Start Time 1045    PT Stop Time 1050    PT Time Calculation (min) 5 min    Activity Tolerance Patient tolerated treatment well    Behavior During Therapy [facility] for tasks assessed/performed             Past Medical History:  Diagnosis Date   Asthma    Breast cancer (HCC)    right   Complication of anesthesia    Dyspnea    Dysrhythmia    2012   Headache    History of kidney stones    passed   Hypothyroidism    Pneumonia 01/26/2023   PONV (postoperative nausea and vomiting)    rt breast ca 08/2022   Right Breast   Scoliosis    Thyroid  disease    Past Surgical History:  Procedure Laterality Date   APPENDECTOMY     AXILLARY SENTINEL NODE BIOPSY Right 10/14/2022   Procedure: RIGHT AXILLARY SENTINEL NODE BIOPSY;  Surgeon: Enid Harry, MD;  Location: [facility] OR;  Service: General;  Laterality: Right;   BREAST BIOPSY Right 09/15/2022   US  RT BREAST BX W LOC DEV 1ST LESION IMG BX SPEC US  GUIDE 09/15/2022 [facility]   BREAST BIOPSY  10/12/2022   US  RT RADIOACTIVE SEED LOC 10/12/2022 [facility]MO[facility]   BREAST LUMPECTOMY WITH RADIOACTIVE SEED AND SENTINEL LYMPH NODE BIOPSY Right 10/14/2022   Procedure: RIGHT BREAST LUMPECTOMY WITH RADIOACTIVE SEED;  Surgeon: Enid Harry, MD;  Location: [facility] OR;  Service: General;  Laterality: Right;   COLONOSCOPY     LAPAROSCOPIC APPENDECTOMY     MASTECTOMY Right    PORT-A-CATH REMOVAL Left 05/03/2023   Procedure: REMOVAL PORT-A-CATH;  Surgeon: Enid Harry, MD;  Location: [facility] OR;  Service: General;  Laterality: Left;   PORT-A-CATH REMOVAL Left 12/22/2023   Procedure: REMOVAL PORT-A-CATH;  Surgeon: Enid Harry, MD;  Location: [facility];  Service: General;  Laterality: Left;   PORTACATH PLACEMENT Left 10/14/2022   Procedure: INSERTION PORT-A-CATH;  Surgeon: Enid Harry, MD;  Location: [facility] OR;  Service: General;  Laterality: Left;   PORTACATH PLACEMENT Left 05/19/2023   Procedure: PORT PLACEMENT WITH ULTRASOUND GUIDANCE;  Surgeon: Enid Harry, MD;  Location: [facility];  Service: General;  Laterality: Left;   RE-EXCISION OF BREAST LUMPECTOMY Right 11/08/2022   Procedure: RE-EXCISION OF RIGHT BREAST LUMPECTOMY;  Surgeon: Enid Harry, MD;  Location: [facility];  Service: General;  Laterality: Right;   RE-EXCISION OF BREAST LUMPECTOMY Right 11/18/2022   Procedure: RE-EXCISION OF RIGHT BREAST LUMPECTOMY;  Surgeon: Enid Harry, MD;  Location: [facility] OR;  Service: General;  Laterality: Right;   SIMPLE MASTECTOMY WITH AXILLARY SENTINEL NODE BIOPSY Right 05/03/2023   Procedure: RIGHT MASTECTOMY;  Surgeon: Enid Harry, MD;  Location: [facility] OR;  Service: General;  Laterality: Right;  GEN & PEC BLOCK   WISDOM TOOTH EXTRACTION     Patient Active Problem List   Diagnosis Date Noted   S/P mastectomy, right 05/03/2023   Port-A-Cath in place 12/16/2022   Genetic testing 10/11/2022   Appendicitis with peritonitis 09/29/2022   Family history of prostate cancer 09/29/2022   Malignant neoplasm of upper-outer  quadrant of right breast in female, estrogen receptor positive (HCC) 09/22/2022   Atrial fibrillation with RVR (HCC) 11/30/2010   Chronic anticoagulation 11/30/2010   Hypothyroidism 11/30/2010   PALPITATIONS 11/27/2010    REFERRING DIAG: right breast cancer at risk for lymphedema  THERAPY DIAG:  Aftercare following surgery for neoplasm  PERTINENT HISTORY:   Patient was diagnosed on 09/15/22 with right grade 3. It measures 1.4 x 0.9 x 1.2 cm and is located in the upper-outer quadrant. It is ER+, PR-, HER2- with a Ki67 of 30%.  10/14/22: R breast lumpectomy 5/5 nodes, 11/08/22: Re excision of R lumpectomy, 11/18/22 - Re excision of R lumpectomy,completed neoadjuvant, 05/03/23 R mastectomy with positive margins 5/5 nodes, pt is currently taking keytruda  and xeloda , will begin radiation on 06/08/23   PRECAUTIONS: right UE Lymphedema risk,   SUBJECTIVE: Wearing my sleeve when I do 7 min exercises in am. No concerns of swelling  PAIN:  Are you having pain? No    SOZO SCREENING: Patient was assessed today using the SOZO machine to determine the lymphedema index score. This was compared to her baseline score. It was determined that she is within the recommended range when compared to her baseline and no further action is needed at this time. She will continue SOZO screenings. These are done every 3 months for 2 years post operatively followed by every 6 months for 2 years, and then annually.  L-DEX FLOWSHEETS - 02/15/24 1000       L-DEX LYMPHEDEMA SCREENING   Measurement Type Unilateral    L-DEX MEASUREMENT EXTREMITY Upper Extremity    POSITION  Standing    DOMINANT SIDE Right    At Risk Side Right    BASELINE SCORE (UNILATERAL) -2.9    L-DEX SCORE (UNILATERAL) 3.2    VALUE CHANGE (UNILAT) 6.1               ** Discussed that score is still normal but only.4 from lymphatic concerns. Advised to wear sleeve daily 4-5 hours or more. Explained that if she was .4 points higher she would wear sleeve daily for 12 hours for 30 days and then retest  to try and reverse it.    Latisha Poland, PT 02/15/2024, 10:51 AM

## 2024-02-16 ENCOUNTER — Encounter: Payer: Self-pay | Admitting: Hematology and Oncology

## 2024-02-20 ENCOUNTER — Inpatient Hospital Stay: Admitting: Hematology and Oncology

## 2024-02-20 VITALS — BP 122/68 | HR 69 | Temp 98.0°F | Resp 18 | Ht 63.0 in | Wt 158.0 lb

## 2024-02-20 DIAGNOSIS — Z170 Estrogen receptor positive status [ER+]: Secondary | ICD-10-CM | POA: Diagnosis not present

## 2024-02-20 DIAGNOSIS — R590 Localized enlarged lymph nodes: Secondary | ICD-10-CM | POA: Diagnosis not present

## 2024-02-20 DIAGNOSIS — C50411 Malignant neoplasm of upper-outer quadrant of right female breast: Secondary | ICD-10-CM | POA: Diagnosis not present

## 2024-02-20 NOTE — Progress Notes (Signed)
 Patient Care Team: Leesa Pulling, MD as PCP - General (Unknown Physician Specialty) Auther Bo, RN as Oncology Nurse Navigator Alane Hsu, RN as Oncology Nurse Navigator Cameron Cea, MD as Consulting Physician (Hematology and Oncology) Retta Caster, MD as Consulting Physician (Radiation Oncology) Enid Harry, MD as Consulting Physician (General Surgery)  DIAGNOSIS:  Encounter Diagnosis  Name Primary?   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female, estrogen receptor positive (HCC) Yes    SUMMARY OF ONCOLOGIC HISTORY: Oncology History  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female, estrogen receptor positive (HCC)  09/15/2022 Initial Diagnosis   Screening mammogram detected right breast mass which measured 1.4 cm by ultrasound at 10 o'clock position, biopsy revealed grade 3 IDC with lymphovascular invasion ER 10%, PR 0%, Ki-67 30%, HER2 0 negative   09/25/2022 Cancer Staging   Staging form: Breast, AJCC 8th Edition - Clinical: Stage IB (cT1c, cN0, cM0, G3, ER+, PR-, HER2-) - Signed by Cameron Cea, MD on 09/25/2022 Histologic grading system: 3 grade system    Genetic Testing   [facility] CancerNext-Expanded Panel+RNA was Negative. Report date is 10/06/2021.  The CancerNext-Expanded gene panel offered by [facility] and includes sequencing, rearrangement, and RNA analysis for the following 77 genes: AIP, ALK, APC, ATM, AXIN2, BAP1, BARD1, BLM, BMPR1A, BRCA1, BRCA2, BRIP1, CDC73, CDH1, CDK4, CDKN1B, CDKN2A, CHEK2, CTNNA1, DICER1, FANCC, FH, FLCN, GALNT12, KIF1B, LZTR1, MAX, MEN1, MET, MLH1, MSH2, MSH3, MSH6, MUTYH, NBN, NF1, NF2, NTHL1, PALB2, PHOX2B, PMS2, POT1, PRKAR1A, PTCH1, PTEN, RAD51C, RAD51D, RB1, RECQL, RET, SDHA, SDHAF2, SDHB, SDHC, SDHD, SMAD4, SMARCA4, SMARCB1, SMARCE1, STK11, SUFU, TMEM127, TP53, TSC1, TSC2, VHL and XRCC2 (sequencing and deletion/duplication); EGFR, EGLN1, HOXB13, KIT, MITF, PDGFRA, POLD1, and  POLE (sequencing only); EPCAM and GREM1 (deletion/duplication only).    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 with high-grade DCIS, inferior margin positive, angiolymphatic invasion present, 5/5 lymph nodes positive, additional medial margin: Poorly differentiated carcinoma with lymphoid stroma, additional inferior margin: Positive, ER 10% weak, PR 0%, HER2 0, Ki-67 30%   10/21/2022 Cancer Staging   Staging form: Breast, AJCC 8th Edition - Pathologic: Stage IIIC (pT1c, pN2a, cM0, G3, ER-, PR-, HER2-) - Signed by Cameron Cea, MD on 10/21/2022 Stage prefix: Initial diagnosis Histologic grading system: 3 grade system   11/08/2022 Surgery   Margin reexcision: Inferior margin: Grade 3 IDC, new margin positive Medial margin: Grade 3 IDC new margin widely involved   11/18/2022 Surgery   Right inferior margin reexcision: Invasive poorly differentiated adenocarcinoma grade 3 present in new inferior margin.  Medial margin reexcision: Invasive poorly differentiated adenocarcinoma grade 3 present at new medial margin, posterior margin: Poorly differentiated adenocarcinoma grade 3, margin negative  11/08/2022 and 11/18/2022: Final margin focal positivity. After much discussion the plan is to finish her systemic treatment and come back at a later point to do mastectomy.    12/02/2022 - 04/18/2023 Chemotherapy   Patient is on Treatment Plan : BREAST ADJUVANT DOSE DENSE AC q14d / PACLitaxel  q7d     05/03/2023 Surgery   Right mastectomy: 8 cm grade 3 IDC with extensive lymphatic and vascular invasion RCB class III, deep margin multifocally positive, 5/5 lymph nodes positive, ER weak 10%, PR 0%, HER2 0, Ki-67 30%   05/25/2023 - 08/17/2023 Chemotherapy   Patient is on Treatment Plan : BREAST Pembrolizumab  (200) q21d x 24 months     05/25/2023 -  Adjuvant Chemotherapy  Capectiabine 1000mg  po BID 2 weeks on, 1 week off;  began 05/25/2023, second cycle delayed to 07/06/2023   06/08/2023 - 07/21/2023 Radiation Therapy   First Treatment Date: 2023-06-08 - Last Treatment Date: 2023-07-21   Plan Name: CW_R_BO Site: Chest Wall, Right Technique: 3D Mode: Photon Dose Per Fraction: 2 Gy Prescribed Dose (Delivered / Prescribed): 26 Gy / 26 Gy Prescribed Fxs (Delivered / Prescribed): 13 / 13   Plan Name: CW_R_SCV_PAB Site: Chest Wall, Right Technique: 3D Mode: Photon Dose Per Fraction: 2 Gy Prescribed Dose (Delivered / Prescribed): 50 Gy / 50 Gy Prescribed Fxs (Delivered / Prescribed): 25 / 25   Plan Name: CW_R_Bst_BO Site: Chest Wall, Right Technique: Electron Mode: Electron Dose Per Fraction: 2 Gy Prescribed Dose (Delivered / Prescribed): 14 Gy / 14 Gy Prescribed Fxs (Delivered / Prescribed): 7 / 7   Plan Name: CW_R Site: Chest Wall, Right Technique: 3D Mode: Photon Dose Per Fraction: 2 Gy Prescribed Dose (Delivered / Prescribed): 24 Gy / 24 Gy Prescribed Fxs (Delivered / Prescribed): 12 / 12     CHIEF COMPLIANT: Follow-up to discuss results of recent CT scans  HISTORY OF PRESENT ILLNESS:   History of Present Illness Caitlin [name] is a 68 year old female with history of breast cancer who presents with a growing lymph node on CT scans.  A recent scan shows a right hilar lymph node, not reported on the February scan. She experiences joint discomfort in her wrists, hands, and back, described as 'achy,' which began after starting Fosamax . Symptoms worsened last week, possibly due to weather changes. She continues Fosamax  but is hesitant to change her medication. She has started a low-impact, full-body exercise routine in the mornings, which has improved her energy levels.  ALLERGIES:  is allergic to penicillins.  MEDICATIONS:  Current Outpatient Medications  Medication Sig Dispense Refill   albuterol  (VENTOLIN  HFA) 108 (90 Base) MCG/ACT inhaler Inhale 2 puffs into the lungs every 4 (four) hours  as needed for shortness of breath (Asthma).     alendronate  (FOSAMAX ) 70 MG tablet Take 1 tablet (70 mg total) by mouth once a week. Take with a full glass of water on an empty stomach. 12 tablet 3   aspirin 81 MG tablet Take 81 mg by mouth daily.       diltiazem  (CARDIZEM  CD) 120 MG 24 hr capsule Take 120 mg by mouth daily.     levothyroxine  (SYNTHROID ) 75 MCG tablet Take 75 mcg by mouth daily before breakfast.     Multiple Vitamins-Minerals (MULTIVITAMIN WITH MINERALS) tablet Take 1 tablet by mouth daily.     OVER THE COUNTER MEDICATION Take 1 capsule by mouth 2 (two) times daily. copaiba softgels     PULMICORT  FLEXHALER 180 MCG/ACT inhaler Inhale 1 puff into the lungs daily.     No current facility-administered medications for this visit.    PHYSICAL EXAMINATION: ECOG PERFORMANCE STATUS: 1 - Symptomatic but completely ambulatory  Vitals:   02/20/24 0826  BP: 122/68  Pulse: 69  Resp: 18  Temp: 98 F (36.7 C)  SpO2: 100%   Filed Weights   02/20/24 0826  Weight: 158 lb (71.7 kg)      LABORATORY DATA:  I have reviewed the data as listed    Latest Ref Rng & Units 02/15/2024    8:36 AM 10/20/2023    9:09 AM 09/08/2023    8:56 AM  CMP  Glucose 70 - 99 mg/dL 70  86  95   BUN 8 -  23 mg/dL 14  21  21    Creatinine 0.44 - 1.00 mg/dL 9.81  1.91  4.78   Sodium 135 - 145 mmol/L 139  139  139   Potassium 3.5 - 5.1 mmol/L 4.1  4.2  4.2   Chloride 98 - 111 mmol/L 106  106  104   CO2 22 - 32 mmol/L 30  27  28    Calcium 8.9 - 10.3 mg/dL 9.3  9.7  9.7   Total Protein 6.5 - 8.1 g/dL 6.9  6.6  6.5   Total Bilirubin 0.0 - 1.2 mg/dL 0.3  0.6  0.5   Alkaline Phos 38 - 126 U/L 79  114  81   AST 15 - 41 U/L 16  17  13    ALT 0 - 44 U/L 13  11  16      Lab Results  Component Value Date   WBC 9.4 02/15/2024   HGB 13.0 02/15/2024   HCT 38.6 02/15/2024   MCV 97.5 02/15/2024   PLT 319 02/15/2024   NEUTROABS 4.7 02/15/2024    ASSESSMENT & PLAN:  Malignant neoplasm of upper-outer quadrant  of right breast in female, estrogen receptor positive (HCC) 10/14/2022:Right lumpectomy: 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  No distant metastatic disease. Bone scan 10/29/2022: No evidence of bone metastasis   11/08/2022 and 11/18/2022: Margin reexcision surgeries: Final margin focal positivity.  After much discussion the plan is to finish her systemic treatment and come back at a later point to do mastectomy.   Treatment summary: Adjuvant chemotherapy with dose dense Adriamycin  and Cytoxan  followed by Taxol  to be completed 05/02/2023, Keytruda  05/25/2023-08/17/2023 (discontinued due to immune mediated adverse effects: Skin rash) Mastectomy by Dr. Delane Fear 05/03/2023:Right mastectomy: 8 cm grade 3 IDC with extensive lymphatic and vascular invasion RCB class III, deep margin multifocally positive, 5/5 lymph nodes positive, ER weak 10%, PR 0%, HER2 0, Ki-67 30%, total lymph nodes: 10/10 (prior lumpectomy she had 5 positive nodes) Adjuvant radiation to start 06/09/2023-07/21/23 Capecitabine  started 05/25/2023-11/25/2023 ------------------------------------------------------------------------------------------------------------------------------------------------ MRI of the liver 06/12/23: Benign MRI brain 08/02/2023: Benign CT CAP and bone scan 11/23/2023: New clustered irregular nodules posterior left upper lobe 10 mm favored infectious/inflammatory, new subpleural reticulations anterior right  upper lobe postradiation.  No metastatic disease   Guardant reveal 12/14/2023: CT DNA detected 2.7% 02/16/2024: CT CAP: Interval enlargement of left superior mediastinal lymph node 2 x 1.8 cm concerning for metastatic disease  Plan: Consult pulmonary for bronchoscopy EBUS and biopsy PET CT scan Switch guardant reveal to every 3 months  Return to clinic to discuss treatment options     No orders of the defined types were placed in this encounter.  The patient has a good understanding of the overall plan. she agrees with it. she will call with any problems that may develop before the next visit here. Total time spent: 30 mins including face to face time and time spent for planning, charting and co-ordination of care   Margert Sheerer, MD 02/20/24

## 2024-02-20 NOTE — Assessment & Plan Note (Signed)
 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  No distant metastatic disease. Bone scan 10/29/2022: No evidence of bone metastasis   11/08/2022 and 11/18/2022: Margin reexcision surgeries: Final margin focal positivity.  After much discussion the plan is to finish her systemic treatment and come back at a later point to do mastectomy.   Treatment summary: Adjuvant chemotherapy with dose dense Adriamycin  and Cytoxan  followed by Taxol  to be completed 05/02/2023, Keytruda  05/25/2023-08/17/2023 (discontinued due to immune mediated adverse effects: Skin rash) Mastectomy by Dr. Delane Fear 05/03/2023:Right mastectomy: 8 cm grade 3 IDC with extensive lymphatic and vascular invasion RCB class III, deep margin multifocally positive, 5/5 lymph nodes positive, ER weak 10%, PR 0%, HER2 0, Ki-67 30%, total lymph nodes: 10/10 (prior lumpectomy she had 5 positive nodes) Adjuvant radiation to start 06/09/2023-07/21/23 Capecitabine  started 05/25/2023-11/25/2023 ------------------------------------------------------------------------------------------------------------------------------------------------ MRI of the liver 06/12/23: Benign MRI brain 08/02/2023: Benign CT CAP and bone scan 11/23/2023: New clustered irregular nodules posterior left upper lobe 10 mm favored infectious/inflammatory, new subpleural reticulations anterior right upper lobe postradiation.  No metastatic disease   Guardant  reveal 12/14/2023: CT DNA detected 2.7% 02/16/2024: CT CAP: Interval enlargement of left superior mediastinal lymph node 2 x 1.8 cm concerning for metastatic disease  Plan: Consult pulmonary for bronchoscopy EBUS and biopsy PET CT scan  Return to clinic to discuss treatment options

## 2024-02-23 ENCOUNTER — Telehealth (HOSPITAL_COMMUNITY): Payer: Self-pay

## 2024-02-23 NOTE — Telephone Encounter (Signed)
 Per md orders entered for [employer] and all supported documents faxed to [phone]. Faxed confirmation was received.

## 2024-02-23 NOTE — Telephone Encounter (Signed)
 Open in error

## 2024-02-24 ENCOUNTER — Encounter (HOSPITAL_COMMUNITY)
Admission: RE | Admit: 2024-02-24 | Discharge: 2024-02-24 | Disposition: A | Discharge status: Home / Self Care | Source: Ambulatory Visit | Attending: Hematology and Oncology

## 2024-02-24 ENCOUNTER — Encounter: Payer: Self-pay | Admitting: Hematology and Oncology

## 2024-02-24 DIAGNOSIS — R590 Localized enlarged lymph nodes: Secondary | ICD-10-CM | POA: Diagnosis present

## 2024-02-24 DIAGNOSIS — C50411 Malignant neoplasm of upper-outer quadrant of right female breast: Secondary | ICD-10-CM | POA: Insufficient documentation

## 2024-02-24 DIAGNOSIS — Z170 Estrogen receptor positive status [ER+]: Secondary | ICD-10-CM | POA: Diagnosis present

## 2024-02-24 LAB — GLUCOSE, CAPILLARY: Glucose-Capillary: 99 mg/dL (ref 70–99)

## 2024-02-24 MED ORDER — FLUDEOXYGLUCOSE F - 18 (FDG) INJECTION
8.0000 | Freq: Once | INTRAVENOUS | Status: AC | PRN
Start: 2024-02-24 — End: 2024-02-24
  Administered 2024-02-24: 7.8 via INTRAVENOUS

## 2024-02-26 ENCOUNTER — Encounter: Payer: Self-pay | Admitting: Hematology and Oncology

## 2024-02-28 ENCOUNTER — Inpatient Hospital Stay (HOSPITAL_BASED_OUTPATIENT_CLINIC_OR_DEPARTMENT_OTHER): Admitting: Hematology and Oncology

## 2024-02-28 ENCOUNTER — Other Ambulatory Visit: Payer: Self-pay | Admitting: *Deleted

## 2024-02-28 ENCOUNTER — Ambulatory Visit: Admitting: Hematology and Oncology

## 2024-02-28 DIAGNOSIS — Z170 Estrogen receptor positive status [ER+]: Secondary | ICD-10-CM

## 2024-02-28 DIAGNOSIS — Z1722 Progesterone receptor negative status: Secondary | ICD-10-CM | POA: Diagnosis not present

## 2024-02-28 DIAGNOSIS — Z1732 Human epidermal growth factor receptor 2 negative status: Secondary | ICD-10-CM | POA: Diagnosis not present

## 2024-02-28 DIAGNOSIS — C50411 Malignant neoplasm of upper-outer quadrant of right female breast: Secondary | ICD-10-CM

## 2024-02-28 NOTE — Progress Notes (Signed)
 Per MD request RN successfully placed order for U/S right axilla and biopsy of lymph node.  MD requesting Breast Prognostic panel and Guardant tissue with PDL-1 be added to biopsy.  Message sent to the breast center to schedule pt for US  and biopsy.  Prognostic panel and guardant tissue with PDL-1 will be requested once biopsy results are resulted.

## 2024-02-28 NOTE — Progress Notes (Signed)
 HEMATOLOGY-ONCOLOGY TELEPHONE VISIT PROGRESS NOTE  I connected with our patient on 02/28/24 at  9:30 AM EDT by telephone and verified that I am speaking with the correct person using two identifiers.  I discussed the limitations, risks, security and privacy concerns of performing an evaluation and management service by telephone and the availability of in person appointments.  I also discussed with the patient that there may be a patient responsible charge related to this service. The patient expressed understanding and agreed to proceed.   History of Present Illness:    History of Present Illness Caitlin [name] is a 68 year old female who presents for evaluation of multiple active lymph nodes identified on imaging.  Imaging studies reveal multiple active lymph nodes, including mediastinal, prevascular, left internal mammary, and right axillary lymph nodes. The mediastinal lymph node shows a brightness of 16 on the PET scan, and the right axillary lymph node shows a brightness of 5.3, both indicating increased activity. A biopsy of the right axillary lymph node is under consideration due to its accessibility.    Oncology History  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female, estrogen receptor positive (HCC)  09/15/2022 Initial Diagnosis   Screening mammogram detected right breast mass which measured 1.4 cm by ultrasound at 10 o'clock position, biopsy revealed grade 3 IDC with lymphovascular invasion ER 10%, PR 0%, Ki-67 30%, HER2 0 negative   09/25/2022 Cancer Staging   Staging form: Breast, AJCC 8th Edition - Clinical: Stage IB (cT1c, cN0, cM0, G3, ER+, PR-, HER2-) - Signed by Cameron Cea, MD on 09/25/2022 Histologic grading system: 3 grade system    Genetic Testing   [facility] CancerNext-Expanded Panel+RNA was Negative. Report date is 10/06/2021.  The CancerNext-Expanded gene panel offered by [facility] and includes sequencing, rearrangement, and RNA analysis  for the following 77 genes: AIP, ALK, APC, ATM, AXIN2, BAP1, BARD1, BLM, BMPR1A, BRCA1, BRCA2, BRIP1, CDC73, CDH1, CDK4, CDKN1B, CDKN2A, CHEK2, CTNNA1, DICER1, FANCC, FH, FLCN, GALNT12, KIF1B, LZTR1, MAX, MEN1, MET, MLH1, MSH2, MSH3, MSH6, MUTYH, NBN, NF1, NF2, NTHL1, PALB2, PHOX2B, PMS2, POT1, PRKAR1A, PTCH1, PTEN, RAD51C, RAD51D, RB1, RECQL, RET, SDHA, SDHAF2, SDHB, SDHC, SDHD, SMAD4, SMARCA4, SMARCB1, SMARCE1, STK11, SUFU, TMEM127, TP53, TSC1, TSC2, VHL and XRCC2 (sequencing and deletion/duplication); EGFR, EGLN1, HOXB13, KIT, MITF, PDGFRA, POLD1, and POLE (sequencing only); EPCAM and GREM1 (deletion/duplication only).    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 with high-grade DCIS, inferior margin positive, angiolymphatic invasion present, 5/5 lymph nodes positive, additional medial margin: Poorly differentiated carcinoma with lymphoid stroma, additional inferior margin: Positive, ER 10% weak, PR 0%, HER2 0, Ki-67 30%   10/21/2022 Cancer Staging   Staging form: Breast, AJCC 8th Edition - Pathologic: Stage IIIC (pT1c, pN2a, cM0, G3, ER-, PR-, HER2-) - Signed by Cameron Cea, MD on 10/21/2022 Stage prefix: Initial diagnosis Histologic grading system: 3 grade system   11/08/2022 Surgery   Margin reexcision: Inferior margin: Grade 3 IDC, new margin positive Medial margin: Grade 3 IDC new margin widely involved   11/18/2022 Surgery   Right inferior margin reexcision: Invasive poorly differentiated adenocarcinoma grade 3 present in new inferior margin.  Medial margin reexcision: Invasive poorly differentiated adenocarcinoma grade 3 present at new medial margin, posterior margin: Poorly differentiated adenocarcinoma grade 3, margin negative  11/08/2022 and 11/18/2022: Final margin focal positivity. After much discussion the plan is to finish her systemic treatment and come back at a later point to  do mastectomy.     12/02/2022 - 04/18/2023 Chemotherapy   Patient is on Treatment Plan : BREAST ADJUVANT DOSE DENSE AC q14d / PACLitaxel  q7d     05/03/2023 Surgery   Right mastectomy: 8 cm grade 3 IDC with extensive lymphatic and vascular invasion RCB class III, deep margin multifocally positive, 5/5 lymph nodes positive, ER weak 10%, PR 0%, HER2 0, Ki-67 30%   05/25/2023 - 08/17/2023 Chemotherapy   Patient is on Treatment Plan : BREAST Pembrolizumab  (200) q21d x 24 months     05/25/2023 -  Adjuvant Chemotherapy   Capectiabine 1000mg  po BID 2 weeks on, 1 week off; began 05/25/2023, second cycle delayed to 07/06/2023   06/08/2023 - 07/21/2023 Radiation Therapy   First Treatment Date: 2023-06-08 - Last Treatment Date: 2023-07-21   Plan Name: CW_R_BO Site: Chest Wall, Right Technique: 3D Mode: Photon Dose Per Fraction: 2 Gy Prescribed Dose (Delivered / Prescribed): 26 Gy / 26 Gy Prescribed Fxs (Delivered / Prescribed): 13 / 13   Plan Name: CW_R_SCV_PAB Site: Chest Wall, Right Technique: 3D Mode: Photon Dose Per Fraction: 2 Gy Prescribed Dose (Delivered / Prescribed): 50 Gy / 50 Gy Prescribed Fxs (Delivered / Prescribed): 25 / 25   Plan Name: CW_R_Bst_BO Site: Chest Wall, Right Technique: Electron Mode: Electron Dose Per Fraction: 2 Gy Prescribed Dose (Delivered / Prescribed): 14 Gy / 14 Gy Prescribed Fxs (Delivered / Prescribed): 7 / 7   Plan Name: CW_R Site: Chest Wall, Right Technique: 3D Mode: Photon Dose Per Fraction: 2 Gy Prescribed Dose (Delivered / Prescribed): 24 Gy / 24 Gy Prescribed Fxs (Delivered / Prescribed): 12 / 12     REVIEW OF SYSTEMS:   Constitutional: Denies fevers, chills or abnormal weight loss All other systems were reviewed with the patient and are negative. Observations/Objective:     Assessment Plan:  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female, estrogen receptor positive (HCC) 10/14/2022:Right lumpectomy: Grade 3 invasive poorly  differentiated ductal adenocarcinoma 1.4 cm with focal lobular features with high-grade DCIS, inferior margin positive, angiolymphatic invasion present, 5/5 lymph nodes positive, additional medial margin: Poorly differentiated carcinoma with lymphoid stroma, additional inferior margin: Positive, ER 10% weak, PR 0%, HER2 0, Ki-67 30% T1c N2a: Stage IIIc pathological staging   CT CAP 10/29/2022: Several prominent lymph nodes and enlarged nodes right axilla and right supraclavicular region many of these are nonpathologic by size criteria.  No distant metastatic disease. Bone scan 10/29/2022: No evidence of bone metastasis   11/08/2022 and 11/18/2022: Margin reexcision surgeries: Final margin focal positivity.  After much discussion the plan is to finish her systemic treatment and come back at a later point to do mastectomy.   Treatment summary: Adjuvant chemotherapy with dose dense Adriamycin  and Cytoxan  followed by Taxol  to be completed 05/02/2023, Keytruda  05/25/2023-08/17/2023 (discontinued due to immune mediated adverse effects: Skin rash) Mastectomy by Dr. Delane Fear 05/03/2023:Right mastectomy: 8 cm grade 3 IDC with extensive lymphatic and vascular invasion RCB class III, deep margin multifocally positive, 5/5 lymph nodes positive, ER weak 10%, PR 0%, HER2 0, Ki-67 30%, total lymph nodes: 10/10 (prior lumpectomy she had 5 positive nodes) Adjuvant radiation to start 06/09/2023-07/21/23 Capecitabine  started 05/25/2023-11/25/2023 ------------------------------------------------------------------------------------------------------------------------------------------------ MRI of the liver 06/12/23: Benign MRI brain 08/02/2023: Benign CT CAP and bone scan 11/23/2023: New clustered irregular nodules posterior left upper lobe 10 mm favored infectious/inflammatory, new subpleural reticulations anterior right upper lobe postradiation.  No metastatic disease   Guardant reveal 12/14/2023: CT DNA detected 2.7% 02/16/2024:  CT CAP: Interval  enlargement of left superior mediastinal lymph node 2 x 1.8 cm concerning for metastatic disease 02/26/2024: PET/CT: Few hypermetabolic lymph nodes identified consistent with metastatic disease including left upper mediastinal, prevascular, left internal mammary and right axillary region  Treatment plan:  Biopsy of the axillary lymph node Evaluate treatment options based on final pathology findings. --------------------------------- Assessment and Plan Assessment & Plan Malignant neoplasm of breast with lymph node involvement Multiple lymph nodes involved, including mediastinal, prevascular, left internal mammary, and right axillary. PET scan: mediastinal node SUV 16, right axillary node SUV 5.3. Differential: cancer vs. reactive lymph nodes. Biopsy needed to determine nature and guide treatment. - Consult radiology for ultrasound-guided biopsy of right axillary lymph node. - Discuss treatment options post-biopsy, considering triple-negative breast cancer and low HER2 expression.      I discussed the assessment and treatment plan with the patient. The patient was provided an opportunity to ask questions and all were answered. The patient agreed with the plan and demonstrated an understanding of the instructions. The patient was advised to call back or seek an in-person evaluation if the symptoms worsen or if the condition fails to improve as anticipated.   I provided 20 minutes of non-face-to-face time during this encounter.  This includes time for charting and coordination of care   Margert Sheerer, MD

## 2024-02-28 NOTE — Assessment & Plan Note (Signed)
 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  No distant metastatic disease. Bone scan 10/29/2022: No evidence of bone metastasis   11/08/2022 and 11/18/2022: Margin reexcision surgeries: Final margin focal positivity.  After much discussion the plan is to finish her systemic treatment and come back at a later point to do mastectomy.   Treatment summary: Adjuvant chemotherapy with dose dense Adriamycin  and Cytoxan  followed by Taxol  to be completed 05/02/2023, Keytruda  05/25/2023-08/17/2023 (discontinued due to immune mediated adverse effects: Skin rash) Mastectomy by Dr. Delane Fear 05/03/2023:Right mastectomy: 8 cm grade 3 IDC with extensive lymphatic and vascular invasion RCB class III, deep margin multifocally positive, 5/5 lymph nodes positive, ER weak 10%, PR 0%, HER2 0, Ki-67 30%, total lymph nodes: 10/10 (prior lumpectomy she had 5 positive nodes) Adjuvant radiation to start 06/09/2023-07/21/23 Capecitabine  started 05/25/2023-11/25/2023 ------------------------------------------------------------------------------------------------------------------------------------------------ MRI of the liver 06/12/23: Benign MRI brain 08/02/2023: Benign CT CAP and bone scan 11/23/2023: New clustered irregular nodules posterior left upper lobe 10 mm favored infectious/inflammatory, new subpleural reticulations anterior right upper lobe postradiation.  No metastatic disease   Guardant  reveal 12/14/2023: CT DNA detected 2.7% 02/16/2024: CT CAP: Interval enlargement of left superior mediastinal lymph node 2 x 1.8 cm concerning for metastatic disease 02/26/2024: PET/CT: Few hypermetabolic lymph nodes identified consistent with metastatic disease including left upper mediastinal, prevascular, left internal mammary and right axillary region  Treatment plan:  Biopsy of the axillary lymph node Evaluate treatment options based on final pathology findings.

## 2024-02-29 ENCOUNTER — Encounter: Payer: Self-pay | Admitting: Hematology and Oncology

## 2024-03-02 ENCOUNTER — Ambulatory Visit
Admission: RE | Admit: 2024-03-02 | Discharge: 2024-03-02 | Disposition: A | Discharge status: Home / Self Care | Source: Ambulatory Visit | Attending: Hematology and Oncology | Admitting: Hematology and Oncology

## 2024-03-02 ENCOUNTER — Other Ambulatory Visit

## 2024-03-02 ENCOUNTER — Other Ambulatory Visit: Payer: Self-pay | Admitting: Hematology and Oncology

## 2024-03-02 DIAGNOSIS — Z170 Estrogen receptor positive status [ER+]: Secondary | ICD-10-CM

## 2024-03-05 ENCOUNTER — Encounter: Payer: Self-pay | Admitting: Hematology and Oncology

## 2024-03-05 LAB — SURGICAL PATHOLOGY

## 2024-03-06 ENCOUNTER — Encounter: Payer: Self-pay | Admitting: *Deleted

## 2024-03-06 NOTE — Progress Notes (Signed)
 Per MD request, RN successfully faxed [employer] request on recent biopsy.

## 2024-03-08 ENCOUNTER — Inpatient Hospital Stay: Attending: Hematology and Oncology | Admitting: Hematology and Oncology

## 2024-03-08 VITALS — BP 110/78 | HR 76 | Temp 97.7°F | Resp 16 | Ht 63.0 in | Wt 154.8 lb

## 2024-03-08 DIAGNOSIS — Z923 Personal history of irradiation: Secondary | ICD-10-CM | POA: Diagnosis not present

## 2024-03-08 DIAGNOSIS — C50411 Malignant neoplasm of upper-outer quadrant of right female breast: Secondary | ICD-10-CM | POA: Diagnosis present

## 2024-03-08 DIAGNOSIS — C773 Secondary and unspecified malignant neoplasm of axilla and upper limb lymph nodes: Secondary | ICD-10-CM | POA: Insufficient documentation

## 2024-03-08 DIAGNOSIS — Z9221 Personal history of antineoplastic chemotherapy: Secondary | ICD-10-CM | POA: Diagnosis not present

## 2024-03-08 DIAGNOSIS — Z170 Estrogen receptor positive status [ER+]: Secondary | ICD-10-CM | POA: Insufficient documentation

## 2024-03-08 DIAGNOSIS — Z9011 Acquired absence of right breast and nipple: Secondary | ICD-10-CM | POA: Diagnosis not present

## 2024-03-08 NOTE — Assessment & Plan Note (Signed)
 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  No distant metastatic disease. Bone scan 10/29/2022: No evidence of bone metastasis   11/08/2022 and 11/18/2022: Margin reexcision surgeries: Final margin focal positivity.  After much discussion the plan is to finish her systemic treatment and come back at a later point to do mastectomy.   Treatment summary: Adjuvant chemotherapy with dose dense Adriamycin  and Cytoxan  followed by Taxol  to be completed 05/02/2023, Keytruda  05/25/2023-08/17/2023 (discontinued due to immune mediated adverse effects: Skin rash) Mastectomy by Dr. Delane Fear 05/03/2023:Right mastectomy: 8 cm grade 3 IDC with extensive lymphatic and vascular invasion RCB class III, deep margin multifocally positive, 5/5 lymph nodes positive, ER weak 10%, PR 0%, HER2 0, Ki-67 30%, total lymph nodes: 10/10 (prior lumpectomy she had 5 positive nodes) Adjuvant radiation to start 06/09/2023-07/21/23 Capecitabine  started 05/25/2023-11/25/2023 ------------------------------------------------------------------------------------------------------------------------------------------------ Guardant reveal 12/14/2023: CT DNA detected 2.7% 02/16/2024: CT CAP: Interval enlargement of left superior mediastinal lymph node 2 x 1.8 cm concerning for metastatic disease 02/26/2024: PET/CT: Few hypermetabolic lymph nodes identified consistent with metastatic disease including left upper  mediastinal, prevascular, left internal mammary and right axillary region  Right axillary lymph node biopsy: Positive for metastatic ductal carcinoma ER 0%, PR 0%, HER2 0, Ki-67 50% [facility] tissue molecular testing is pending

## 2024-03-08 NOTE — Progress Notes (Signed)
 Patient Care Team: Leesa Pulling, MD as PCP - General (Unknown Physician Specialty) Auther Bo, RN as Oncology Nurse Navigator Alane Hsu, RN as Oncology Nurse Navigator Cameron Cea, MD as Consulting Physician (Hematology and Oncology) Retta Caster, MD as Consulting Physician (Radiation Oncology) Enid Harry, MD as Consulting Physician (General Surgery)  DIAGNOSIS:  Encounter Diagnosis  Name Primary?   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female, estrogen receptor positive (HCC) Yes    SUMMARY OF ONCOLOGIC HISTORY: Oncology History  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female, estrogen receptor positive (HCC)  09/15/2022 Initial Diagnosis   Screening mammogram detected right breast mass which measured 1.4 cm by ultrasound at 10 o'clock position, biopsy revealed grade 3 IDC with lymphovascular invasion ER 10%, PR 0%, Ki-67 30%, HER2 0 negative   09/25/2022 Cancer Staging   Staging form: Breast, AJCC 8th Edition - Clinical: Stage IB (cT1c, cN0, cM0, G3, ER+, PR-, HER2-) - Signed by Cameron Cea, MD on 09/25/2022 Histologic grading system: 3 grade system    Genetic Testing   [facility] CancerNext-Expanded Panel+RNA was Negative. Report date is 10/06/2021.  The CancerNext-Expanded gene panel offered by [facility] and includes sequencing, rearrangement, and RNA analysis for the following 77 genes: AIP, ALK, APC, ATM, AXIN2, BAP1, BARD1, BLM, BMPR1A, BRCA1, BRCA2, BRIP1, CDC73, CDH1, CDK4, CDKN1B, CDKN2A, CHEK2, CTNNA1, DICER1, FANCC, FH, FLCN, GALNT12, KIF1B, LZTR1, MAX, MEN1, MET, MLH1, MSH2, MSH3, MSH6, MUTYH, NBN, NF1, NF2, NTHL1, PALB2, PHOX2B, PMS2, POT1, PRKAR1A, PTCH1, PTEN, RAD51C, RAD51D, RB1, RECQL, RET, SDHA, SDHAF2, SDHB, SDHC, SDHD, SMAD4, SMARCA4, SMARCB1, SMARCE1, STK11, SUFU, TMEM127, TP53, TSC1, TSC2, VHL and XRCC2 (sequencing and deletion/duplication); EGFR, EGLN1, HOXB13, KIT, MITF, PDGFRA, POLD1, and  POLE (sequencing only); EPCAM and GREM1 (deletion/duplication only).    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 with high-grade DCIS, inferior margin positive, angiolymphatic invasion present, 5/5 lymph nodes positive, additional medial margin: Poorly differentiated carcinoma with lymphoid stroma, additional inferior margin: Positive, ER 10% weak, PR 0%, HER2 0, Ki-67 30%   10/21/2022 Cancer Staging   Staging form: Breast, AJCC 8th Edition - Pathologic: Stage IIIC (pT1c, pN2a, cM0, G3, ER-, PR-, HER2-) - Signed by Cameron Cea, MD on 10/21/2022 Stage prefix: Initial diagnosis Histologic grading system: 3 grade system   11/08/2022 Surgery   Margin reexcision: Inferior margin: Grade 3 IDC, new margin positive Medial margin: Grade 3 IDC new margin widely involved   11/18/2022 Surgery   Right inferior margin reexcision: Invasive poorly differentiated adenocarcinoma grade 3 present in new inferior margin.  Medial margin reexcision: Invasive poorly differentiated adenocarcinoma grade 3 present at new medial margin, posterior margin: Poorly differentiated adenocarcinoma grade 3, margin negative  11/08/2022 and 11/18/2022: Final margin focal positivity. After much discussion the plan is to finish her systemic treatment and come back at a later point to do mastectomy.    12/02/2022 - 04/18/2023 Chemotherapy   Patient is on Treatment Plan : BREAST ADJUVANT DOSE DENSE AC q14d / PACLitaxel  q7d     05/03/2023 Surgery   Right mastectomy: 8 cm grade 3 IDC with extensive lymphatic and vascular invasion RCB class III, deep margin multifocally positive, 5/5 lymph nodes positive, ER weak 10%, PR 0%, HER2 0, Ki-67 30%   05/25/2023 - 08/17/2023 Chemotherapy   Patient is on Treatment Plan : BREAST Pembrolizumab  (200) q21d x 24 months     05/25/2023 -  Adjuvant Chemotherapy  Capectiabine 1000mg  po BID 2 weeks on, 1 week off;  began 05/25/2023, second cycle delayed to 07/06/2023   06/08/2023 - 07/21/2023 Radiation Therapy   First Treatment Date: 2023-06-08 - Last Treatment Date: 2023-07-21   Plan Name: CW_R_BO Site: Chest Wall, Right Technique: 3D Mode: Photon Dose Per Fraction: 2 Gy Prescribed Dose (Delivered / Prescribed): 26 Gy / 26 Gy Prescribed Fxs (Delivered / Prescribed): 13 / 13   Plan Name: CW_R_SCV_PAB Site: Chest Wall, Right Technique: 3D Mode: Photon Dose Per Fraction: 2 Gy Prescribed Dose (Delivered / Prescribed): 50 Gy / 50 Gy Prescribed Fxs (Delivered / Prescribed): 25 / 25   Plan Name: CW_R_Bst_BO Site: Chest Wall, Right Technique: Electron Mode: Electron Dose Per Fraction: 2 Gy Prescribed Dose (Delivered / Prescribed): 14 Gy / 14 Gy Prescribed Fxs (Delivered / Prescribed): 7 / 7   Plan Name: CW_R Site: Chest Wall, Right Technique: 3D Mode: Photon Dose Per Fraction: 2 Gy Prescribed Dose (Delivered / Prescribed): 24 Gy / 24 Gy Prescribed Fxs (Delivered / Prescribed): 12 / 12     CHIEF COMPLIANT: Patient is here to discuss treatment plan  HISTORY OF PRESENT ILLNESS: Caitlin [name] is a 68 year old with a triple negative breast cancer who underwent recent biopsy of the axillary lymph node and is here today to discuss the results of the biopsy and treatment plan.  She is here today accompanied by Caitlin [name] her husband. She does not have any new symptoms or concerns.  She has chronic fatigue.  Denies any acute pain or discomfort.   ALLERGIES:  is allergic to penicillins.  MEDICATIONS:  Current Outpatient Medications  Medication Sig Dispense Refill   albuterol  (VENTOLIN  HFA) 108 (90 Base) MCG/ACT inhaler Inhale 2 puffs into the lungs every 4 (four) hours as needed for shortness of breath (Asthma).     alendronate  (FOSAMAX ) 70 MG tablet Take 1 tablet (70 mg total) by mouth once a week. Take with a full glass of water on an empty stomach. 12 tablet 3   aspirin 81 MG tablet Take 81 mg by mouth  daily.       diltiazem  (CARDIZEM  CD) 120 MG 24 hr capsule Take 120 mg by mouth daily.     levothyroxine  (SYNTHROID ) 75 MCG tablet Take 75 mcg by mouth daily before breakfast.     Multiple Vitamins-Minerals (MULTIVITAMIN WITH MINERALS) tablet Take 1 tablet by mouth daily.     OVER THE COUNTER MEDICATION Take 1 capsule by mouth 2 (two) times daily. copaiba softgels     PULMICORT  FLEXHALER 180 MCG/ACT inhaler Inhale 1 puff into the lungs daily.     No current facility-administered medications for this visit.    PHYSICAL EXAMINATION: ECOG PERFORMANCE STATUS: 1 - Symptomatic but completely ambulatory  Vitals:   03/08/24 1300  BP: 110/78  Pulse: 76  Resp: 16  Temp: 97.7 F (36.5 C)  SpO2: 98%   Filed Weights   03/08/24 1300  Weight: 154 lb 12.8 oz (70.2 kg)     LABORATORY DATA:  I have reviewed the data as listed    Latest Ref Rng & Units 02/15/2024    8:36 AM 10/20/2023    9:09 AM 09/08/2023    8:56 AM  CMP  Glucose 70 - 99 mg/dL 70  86  95   BUN 8 - 23 mg/dL 14  21  21    Creatinine 0.44 - 1.00 mg/dL 1.61  0.96  0.45   Sodium 135 - 145 mmol/L 139  139  139  Potassium 3.5 - 5.1 mmol/L 4.1  4.2  4.2   Chloride 98 - 111 mmol/L 106  106  104   CO2 22 - 32 mmol/L 30  27  28    Calcium 8.9 - 10.3 mg/dL 9.3  9.7  9.7   Total Protein 6.5 - 8.1 g/dL 6.9  6.6  6.5   Total Bilirubin 0.0 - 1.2 mg/dL 0.3  0.6  0.5   Alkaline Phos 38 - 126 U/L 79  114  81   AST 15 - 41 U/L 16  17  13    ALT 0 - 44 U/L 13  11  16      Lab Results  Component Value Date   WBC 9.4 02/15/2024   HGB 13.0 02/15/2024   HCT 38.6 02/15/2024   MCV 97.5 02/15/2024   PLT 319 02/15/2024   NEUTROABS 4.7 02/15/2024    ASSESSMENT & PLAN:  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female, estrogen receptor positive (HCC) 10/14/2022:Right lumpectomy: Grade 3 invasive poorly differentiated ductal adenocarcinoma 1.4 cm with focal lobular features with high-grade DCIS, inferior margin positive,  angiolymphatic invasion present, 5/5 lymph nodes positive, additional medial margin: Poorly differentiated carcinoma with lymphoid stroma, additional inferior margin: Positive, ER 10% weak, PR 0%, HER2 0, Ki-67 30% T1c N2a: Stage IIIc pathological staging   CT CAP 10/29/2022: Several prominent lymph nodes and enlarged nodes right axilla and right supraclavicular region many of these are nonpathologic by size criteria.  No distant metastatic disease. Bone scan 10/29/2022: No evidence of bone metastasis   11/08/2022 and 11/18/2022: Margin reexcision surgeries: Final margin focal positivity.  After much discussion the plan is to finish her systemic treatment and come back at a later point to do mastectomy.   Treatment summary: Adjuvant chemotherapy with dose dense Adriamycin  and Cytoxan  followed by Taxol  to be completed 05/02/2023, Keytruda  05/25/2023-08/17/2023 (discontinued due to immune mediated adverse effects: Skin rash) Mastectomy by Dr. Delane Fear 05/03/2023:Right mastectomy: 8 cm grade 3 IDC with extensive lymphatic and vascular invasion RCB class III, deep margin multifocally positive, 5/5 lymph nodes positive, ER weak 10%, PR 0%, HER2 0, Ki-67 30%, total lymph nodes: 10/10 (prior lumpectomy she had 5 positive nodes) Adjuvant radiation to start 06/09/2023-07/21/23 Capecitabine  started 05/25/2023-11/25/2023 ------------------------------------------------------------------------------------------------------------------------------------------------ Guardant reveal 12/14/2023: CT DNA detected 2.7% 02/16/2024: CT CAP: Interval enlargement of left superior mediastinal lymph node 2 x 1.8 cm concerning for metastatic disease 02/26/2024: PET/CT: Few hypermetabolic lymph nodes identified consistent with metastatic disease including left upper mediastinal, prevascular, left internal mammary and right axillary region  Right axillary lymph node biopsy: Positive for metastatic ductal carcinoma ER 0%, PR 0%, HER2 0,  Ki-67 50% Guardant tissue molecular testing is pending  Discussion: I reviewed the PET CT scan and the biopsy results in detail.  Being triple negative unfortunately there are no easier treatments.  I discussed her treatment options which include radiation to the mediastinal lymph node and continuing oral capecitabine  maintenance therapy.  The second option would be Sacituzumab-Govitecan and third option would be other systemic chemotherapies like Halaven. Patient did not have a good response to earlier chemotherapy treatments and hence she is very skeptical of any benefit from such treatments.  She is leaning towards not doing anything. I will discuss with radiology if any local radiation options are possible. I will call her and let her know.  Goals of care: I discussed with her that if no further treatment is pursued her life expectancy would be less than a year.  If  treatments are considered it could prolong her life by several months to years depending on the response.  Unfortunately her cancer is not curable her goal is primarily palliation and prolongation of life.  No orders of the defined types were placed in this encounter.  The patient has a good understanding of the overall plan. she agrees with it. she will call with any problems that may develop before the next visit here. Total time spent: 45 mins including face to face time and time spent for planning, charting and co-ordination of care   Viinay K Nusrat Encarnacion, MD 03/08/24

## 2024-03-16 ENCOUNTER — Encounter: Payer: Self-pay | Admitting: *Deleted

## 2024-03-16 NOTE — Progress Notes (Signed)
 Received message from [facility] Reveal team stating pt has canceled Guardant Reveal testing at this time.

## 2024-05-01 ENCOUNTER — Encounter: Payer: Self-pay | Admitting: Hematology and Oncology

## 2024-05-02 ENCOUNTER — Other Ambulatory Visit: Payer: Self-pay | Admitting: *Deleted

## 2024-05-02 DIAGNOSIS — Z170 Estrogen receptor positive status [ER+]: Secondary | ICD-10-CM

## 2024-05-21 ENCOUNTER — Ambulatory Visit: Attending: General Surgery

## 2024-05-21 DIAGNOSIS — Z483 Aftercare following surgery for neoplasm: Secondary | ICD-10-CM | POA: Insufficient documentation

## 2024-05-21 NOTE — Therapy (Signed)
 OUTPATIENT PHYSICAL THERAPY SOZO SCREENING NOTE   Patient Name: Caitlin [name] MRN: [id number] DOB:12-23-1955, 68 y.o., female Today's Date: 05/21/2024  PCP: Toribio Jerel MATSU, MD REFERRING PROVIDER: Ebbie Cough, MD   PT End of Session - 05/21/24 1058     Visit Number 2   unchanged due to screen   PT Start Time 1100    PT Stop Time 1110    PT Time Calculation (min) 10 min    Activity Tolerance Patient tolerated treatment well    Behavior During Therapy [facility] for tasks assessed/performed          Past Medical History:  Diagnosis Date   Asthma    Breast cancer (HCC)    right   Complication of anesthesia    Dyspnea    Dysrhythmia    2012   Headache    History of kidney stones    passed   Hypothyroidism    Pneumonia 01/26/2023   PONV (postoperative nausea and vomiting)    rt breast ca 08/2022   Right Breast   Scoliosis    Thyroid  disease    Past Surgical History:  Procedure Laterality Date   APPENDECTOMY     AXILLARY SENTINEL NODE BIOPSY Right 10/14/2022   Procedure: RIGHT AXILLARY SENTINEL NODE BIOPSY;  Surgeon: Ebbie Cough, MD;  Location: [facility] OR;  Service: General;  Laterality: Right;   BREAST BIOPSY Right 09/15/2022   US  RT BREAST BX W LOC DEV 1ST LESION IMG BX SPEC US  GUIDE 09/15/2022 [facility] MAMMOGRAPHY   BREAST BIOPSY  10/12/2022   US  RT RADIOACTIVE SEED LOC 10/12/2022 [facility] MAMMOGRAPHY   BREAST LUMPECTOMY WITH RADIOACTIVE SEED AND SENTINEL LYMPH NODE BIOPSY Right 10/14/2022   Procedure: RIGHT BREAST LUMPECTOMY WITH RADIOACTIVE SEED;  Surgeon: Ebbie Cough, MD;  Location: [facility] OR;  Service: General;  Laterality: Right;   COLONOSCOPY     LAPAROSCOPIC APPENDECTOMY     MASTECTOMY Right    PORT-A-CATH REMOVAL Left 05/03/2023   Procedure: REMOVAL PORT-A-CATH;  Surgeon: Ebbie Cough, MD;  Location: [facility] OR;  Service: General;  Laterality: Left;   PORT-A-CATH REMOVAL Left 12/22/2023   Procedure: REMOVAL PORT-A-CATH;  Surgeon: Ebbie Cough, MD;  Location: [facility];  Service: General;  Laterality: Left;   PORTACATH PLACEMENT Left 10/14/2022   Procedure: INSERTION PORT-A-CATH;  Surgeon: Ebbie Cough, MD;  Location: [facility] OR;  Service: General;  Laterality: Left;   PORTACATH PLACEMENT Left 05/19/2023   Procedure: PORT PLACEMENT WITH ULTRASOUND GUIDANCE;  Surgeon: Ebbie Cough, MD;  Location: [facility];  Service: General;  Laterality: Left;   RE-EXCISION OF BREAST LUMPECTOMY Right 11/08/2022   Procedure: RE-EXCISION OF RIGHT BREAST LUMPECTOMY;  Surgeon: Ebbie Cough, MD;  Location: [facility];  Service: General;  Laterality: Right;   RE-EXCISION OF BREAST LUMPECTOMY Right 11/18/2022   Procedure: RE-EXCISION OF RIGHT BREAST LUMPECTOMY;  Surgeon: Ebbie Cough, MD;  Location: [facility] OR;  Service: General;  Laterality: Right;   SIMPLE MASTECTOMY WITH AXILLARY SENTINEL NODE BIOPSY Right 05/03/2023   Procedure: RIGHT MASTECTOMY;  Surgeon: Ebbie Cough, MD;  Location: [facility] OR;  Service: General;  Laterality: Right;  GEN & PEC BLOCK   WISDOM TOOTH EXTRACTION     Patient Active Problem List   Diagnosis Date Noted   S/P mastectomy, right 05/03/2023   Port-A-Cath in place 12/16/2022   Genetic testing 10/11/2022   Appendicitis with peritonitis 09/29/2022   Family history of prostate cancer 09/29/2022   Malignant neoplasm of upper-outer quadrant of right  breast in female, estrogen receptor positive (HCC) 09/22/2022   Atrial fibrillation with RVR (HCC) 11/30/2010   Chronic anticoagulation 11/30/2010   Hypothyroidism 11/30/2010   PALPITATIONS 11/27/2010    REFERRING DIAG: left breast cancer at risk for lymphedema  THERAPY DIAG:  No diagnosis found.  PERTINENT HISTORY:   Patient was diagnosed on 09/15/22 with right grade 3. It measures 1.4 x 0.9 x 1.2 cm and is located in the upper-outer quadrant. It is ER+, PR-, HER2- with a Ki67 of 30%. 10/14/22: R breast  lumpectomy 5/5 nodes, 11/08/22: Re excision of R lumpectomy, 11/18/22 - Re excision of R lumpectomy,completed neoadjuvant, 05/03/23 R mastectomy with positive margins 5/5 nodes, pt is currently taking keytruda  and xeloda , will begin radiation on 06/08/23   PRECAUTIONS: right UE Lymphedema risk,   SUBJECTIVE: Wearing sleeve 4 hrs per day. Just found out she has a re-occurrence in the lymph nodes. There is no other chemo they can do. She is doing integrative medicine with someone referred by her son who is a Land.  PAIN:  Are you having pain? No  SOZO SCREENING: Patient was assessed today using the SOZO machine to determine the lymphedema index score. This was compared to her baseline score. It was determined that she is within the recommended range when compared to her baseline and no further action is needed at this time. She will continue SOZO screenings. These are done every 3 months for 2 years post operatively followed by every 6 months for 2 years, and then annually.   L-DEX FLOWSHEETS - 05/21/24 1300       L-DEX LYMPHEDEMA SCREENING   Measurement Type Unilateral    L-DEX MEASUREMENT EXTREMITY Upper Extremity    POSITION  Standing    DOMINANT SIDE Right    At Risk Side Right    BASELINE SCORE (UNILATERAL) -2.9    L-DEX SCORE (UNILATERAL) 0.7    VALUE CHANGE (UNILAT) 3.6             Grayce PARAS Luisfernando Brightwell, PT 05/21/2024, 1:00 PM

## 2024-05-22 ENCOUNTER — Ambulatory Visit (HOSPITAL_COMMUNITY)
Admission: RE | Admit: 2024-05-22 | Discharge: 2024-05-22 | Disposition: A | Discharge status: Home / Self Care | Source: Ambulatory Visit | Attending: Hematology and Oncology | Admitting: Hematology and Oncology

## 2024-05-22 DIAGNOSIS — Z170 Estrogen receptor positive status [ER+]: Secondary | ICD-10-CM | POA: Insufficient documentation

## 2024-05-22 DIAGNOSIS — C50411 Malignant neoplasm of upper-outer quadrant of right female breast: Secondary | ICD-10-CM | POA: Insufficient documentation

## 2024-05-22 MED ORDER — IOHEXOL 300 MG/ML  SOLN
100.0000 mL | Freq: Once | INTRAMUSCULAR | Status: AC | PRN
  Administered 2024-05-22: 100 mL via INTRAVENOUS

## 2024-05-30 ENCOUNTER — Inpatient Hospital Stay: Payer: BC Managed Care – PPO | Admitting: Hematology and Oncology

## 2024-05-30 ENCOUNTER — Other Ambulatory Visit: Payer: Self-pay | Admitting: *Deleted

## 2024-05-30 ENCOUNTER — Encounter: Payer: Self-pay | Admitting: Hematology and Oncology

## 2024-05-30 ENCOUNTER — Ambulatory Visit

## 2024-05-30 DIAGNOSIS — Z170 Estrogen receptor positive status [ER+]: Secondary | ICD-10-CM

## 2024-05-30 NOTE — Assessment & Plan Note (Signed)
 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  No distant metastatic disease. Bone scan 10/29/2022: No evidence of bone metastasis   11/08/2022 and 11/18/2022: Margin reexcision surgeries: Final margin focal positivity.  After much discussion the plan is to finish her systemic treatment and come back at a later point to do mastectomy.   Treatment summary: Adjuvant chemotherapy with dose dense Adriamycin  and Cytoxan  followed by Taxol  to be completed 05/02/2023, Keytruda  05/25/2023-08/17/2023 (discontinued due to immune mediated adverse effects: Skin rash) Mastectomy by Dr. Ebbie 05/03/2023:Right mastectomy: 8 cm grade 3 IDC with extensive lymphatic and vascular invasion RCB class III, deep margin multifocally positive, 5/5 lymph nodes positive, ER weak 10%, PR 0%, HER2 0, Ki-67 30%, total lymph nodes: 10/10 (prior lumpectomy she had 5 positive nodes) Adjuvant radiation to start 06/09/2023-07/21/23 Capecitabine  started 05/25/2023-11/25/2023 ------------------------------------------------------------------------------------------------------------------------------------------------ Guardant reveal 12/14/2023: CT DNA detected 2.7% 02/16/2024: CT CAP: Interval enlargement of left superior mediastinal lymph node 2 x 1.8 cm concerning for metastatic disease 02/26/2024: PET/CT: Few hypermetabolic lymph nodes identified consistent with metastatic disease including left upper  mediastinal, prevascular, left internal mammary and right axillary region CT CAP 05/22/2024: New left internal jugular lymph node 1.4 cm.  Enlarging mediastinal lymph nodes 1.2 cm (was 0.4 cm)   Right axillary lymph node biopsy: Positive for metastatic ductal carcinoma ER 0%, PR 0%, HER2 0, Ki-67 50% Guardant 360 12/26/2023: T p53 present, TMB 5.69, MSI high not detected  Decision: Patient does not want to proceed with any further systemic treatment options. Radiation felt that there could be potential for more radiation if she desires. I discussed with the patient that based upon the CT findings there appears to be a slow progression of disease and hence it is reasonable to continue to watch and monitor without any interventions and recheck with scans in 3 months.

## 2024-05-31 ENCOUNTER — Inpatient Hospital Stay (HOSPITAL_BASED_OUTPATIENT_CLINIC_OR_DEPARTMENT_OTHER): Admitting: Hematology and Oncology

## 2024-05-31 ENCOUNTER — Inpatient Hospital Stay: Attending: Hematology and Oncology

## 2024-05-31 VITALS — BP 98/68 | HR 71 | Temp 97.9°F | Resp 16 | Ht 63.0 in | Wt 144.4 lb

## 2024-05-31 DIAGNOSIS — Z171 Estrogen receptor negative status [ER-]: Secondary | ICD-10-CM | POA: Diagnosis not present

## 2024-05-31 DIAGNOSIS — C773 Secondary and unspecified malignant neoplasm of axilla and upper limb lymph nodes: Secondary | ICD-10-CM | POA: Diagnosis not present

## 2024-05-31 DIAGNOSIS — Z1732 Human epidermal growth factor receptor 2 negative status: Secondary | ICD-10-CM | POA: Diagnosis not present

## 2024-05-31 DIAGNOSIS — Z79899 Other long term (current) drug therapy: Secondary | ICD-10-CM | POA: Insufficient documentation

## 2024-05-31 DIAGNOSIS — Z170 Estrogen receptor positive status [ER+]: Secondary | ICD-10-CM

## 2024-05-31 DIAGNOSIS — I890 Lymphedema, not elsewhere classified: Secondary | ICD-10-CM | POA: Insufficient documentation

## 2024-05-31 DIAGNOSIS — Z1722 Progesterone receptor negative status: Secondary | ICD-10-CM | POA: Insufficient documentation

## 2024-05-31 DIAGNOSIS — Z9011 Acquired absence of right breast and nipple: Secondary | ICD-10-CM | POA: Diagnosis not present

## 2024-05-31 DIAGNOSIS — C50411 Malignant neoplasm of upper-outer quadrant of right female breast: Secondary | ICD-10-CM | POA: Diagnosis not present

## 2024-05-31 DIAGNOSIS — R590 Localized enlarged lymph nodes: Secondary | ICD-10-CM

## 2024-05-31 LAB — CBC WITH DIFFERENTIAL (CANCER CENTER ONLY)
Abs Immature Granulocytes: 0.02 K/uL (ref 0.00–0.07)
Basophils Absolute: 0.1 K/uL (ref 0.0–0.1)
Basophils Relative: 2 %
Eosinophils Absolute: 0.5 K/uL (ref 0.0–0.5)
Eosinophils Relative: 9 %
HCT: 39 % (ref 36.0–46.0)
Hemoglobin: 12.7 g/dL (ref 12.0–15.0)
Immature Granulocytes: 0 %
Lymphocytes Relative: 14 %
Lymphs Abs: 0.8 K/uL (ref 0.7–4.0)
MCH: 31.2 pg (ref 26.0–34.0)
MCHC: 32.6 g/dL (ref 30.0–36.0)
MCV: 95.8 fL (ref 80.0–100.0)
Monocytes Absolute: 0.5 K/uL (ref 0.1–1.0)
Monocytes Relative: 9 %
Neutro Abs: 3.6 K/uL (ref 1.7–7.7)
Neutrophils Relative %: 66 %
Platelet Count: 279 K/uL (ref 150–400)
RBC: 4.07 MIL/uL (ref 3.87–5.11)
RDW: 14.6 % (ref 11.5–15.5)
WBC Count: 5.5 K/uL (ref 4.0–10.5)
nRBC: 0 % (ref 0.0–0.2)

## 2024-05-31 LAB — CMP (CANCER CENTER ONLY)
ALT: 19 U/L (ref 0–44)
AST: 24 U/L (ref 15–41)
Albumin: 4.2 g/dL (ref 3.5–5.0)
Alkaline Phosphatase: 53 U/L (ref 38–126)
Anion gap: 6 (ref 5–15)
BUN: 14 mg/dL (ref 8–23)
CO2: 28 mmol/L (ref 22–32)
Calcium: 9.1 mg/dL (ref 8.9–10.3)
Chloride: 105 mmol/L (ref 98–111)
Creatinine: 0.93 mg/dL (ref 0.44–1.00)
GFR, Estimated: >60 mL/min (ref >60)
Glucose, Bld: 82 mg/dL (ref 70–99)
Potassium: 4.4 mmol/L (ref 3.5–5.1)
Sodium: 139 mmol/L (ref 135–145)
Total Bilirubin: 0.4 mg/dL (ref 0.0–1.2)
Total Protein: 6.8 g/dL (ref 6.5–8.1)

## 2024-05-31 NOTE — Progress Notes (Signed)
 Patient Care Team: Toribio Jerel MATSU, MD as PCP - General (Unknown Physician Specialty) Glean Stephane BROCKS, RN (Inactive) as Oncology Nurse Navigator Tyree Nanetta SAILOR, RN as Oncology Nurse Navigator Odean Potts, MD as Consulting Physician (Hematology and Oncology) Shannon Agent, MD as Consulting Physician (Radiation Oncology) Ebbie Cough, MD as Consulting Physician (General Surgery)  DIAGNOSIS:  Encounter Diagnoses  Name Primary?   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female, estrogen receptor positive (HCC) Yes   Mediastinal lymphadenopathy     SUMMARY OF ONCOLOGIC HISTORY: Oncology History  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female, estrogen receptor positive (HCC)  09/15/2022 Initial Diagnosis   Screening mammogram detected right breast mass which measured 1.4 cm by ultrasound at 10 o'clock position, biopsy revealed grade 3 IDC with lymphovascular invasion ER 10%, PR 0%, Ki-67 30%, HER2 0 negative   09/25/2022 Cancer Staging   Staging form: Breast, AJCC 8th Edition - Clinical: Stage IB (cT1c, cN0, cM0, G3, ER+, PR-, HER2-) - Signed by Odean Potts, MD on 09/25/2022 Histologic grading system: 3 grade system    Genetic Testing   [facility] CancerNext-Expanded Panel+RNA was Negative. Report date is 10/06/2021.  The CancerNext-Expanded gene panel offered by [facility] and includes sequencing, rearrangement, and RNA analysis for the following 77 genes: AIP, ALK, APC, ATM, AXIN2, BAP1, BARD1, BLM, BMPR1A, BRCA1, BRCA2, BRIP1, CDC73, CDH1, CDK4, CDKN1B, CDKN2A, CHEK2, CTNNA1, DICER1, FANCC, FH, FLCN, GALNT12, KIF1B, LZTR1, MAX, MEN1, MET, MLH1, MSH2, MSH3, MSH6, MUTYH, NBN, NF1, NF2, NTHL1, PALB2, PHOX2B, PMS2, POT1, PRKAR1A, PTCH1, PTEN, RAD51C, RAD51D, RB1, RECQL, RET, SDHA, SDHAF2, SDHB, SDHC, SDHD, SMAD4, SMARCA4, SMARCB1, SMARCE1, STK11, SUFU, TMEM127, TP53, TSC1, TSC2, VHL and XRCC2 (sequencing and deletion/duplication); EGFR,  EGLN1, HOXB13, KIT, MITF, PDGFRA, POLD1, and POLE (sequencing only); EPCAM and GREM1 (deletion/duplication only).    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 with high-grade DCIS, inferior margin positive, angiolymphatic invasion present, 5/5 lymph nodes positive, additional medial margin: Poorly differentiated carcinoma with lymphoid stroma, additional inferior margin: Positive, ER 10% weak, PR 0%, HER2 0, Ki-67 30%   10/21/2022 Cancer Staging   Staging form: Breast, AJCC 8th Edition - Pathologic: Stage IIIC (pT1c, pN2a, cM0, G3, ER-, PR-, HER2-) - Signed by Odean Potts, MD on 10/21/2022 Stage prefix: Initial diagnosis Histologic grading system: 3 grade system   11/08/2022 Surgery   Margin reexcision: Inferior margin: Grade 3 IDC, new margin positive Medial margin: Grade 3 IDC new margin widely involved   11/18/2022 Surgery   Right inferior margin reexcision: Invasive poorly differentiated adenocarcinoma grade 3 present in new inferior margin.  Medial margin reexcision: Invasive poorly differentiated adenocarcinoma grade 3 present at new medial margin, posterior margin: Poorly differentiated adenocarcinoma grade 3, margin negative  11/08/2022 and 11/18/2022: Final margin focal positivity. After much discussion the plan is to finish her systemic treatment and come back at a later point to do mastectomy.    12/02/2022 - 04/18/2023 Chemotherapy   Patient is on Treatment Plan : BREAST ADJUVANT DOSE DENSE AC q14d / PACLitaxel  q7d     05/03/2023 Surgery   Right mastectomy: 8 cm grade 3 IDC with extensive lymphatic and vascular invasion RCB class III, deep margin multifocally positive, 5/5 lymph nodes positive, ER weak 10%, PR 0%, HER2 0, Ki-67 30%   05/25/2023 - 08/17/2023 Chemotherapy   Patient is on Treatment Plan : BREAST Pembrolizumab  (200) q21d x 24 months  05/25/2023 -  Adjuvant Chemotherapy    Capectiabine 1000mg  po BID 2 weeks on, 1 week off; began 05/25/2023, second cycle delayed to 07/06/2023   06/08/2023 - 07/21/2023 Radiation Therapy   First Treatment Date: 2023-06-08 - Last Treatment Date: 2023-07-21   Plan Name: CW_R_BO Site: Chest Wall, Right Technique: 3D Mode: Photon Dose Per Fraction: 2 Gy Prescribed Dose (Delivered / Prescribed): 26 Gy / 26 Gy Prescribed Fxs (Delivered / Prescribed): 13 / 13   Plan Name: CW_R_SCV_PAB Site: Chest Wall, Right Technique: 3D Mode: Photon Dose Per Fraction: 2 Gy Prescribed Dose (Delivered / Prescribed): 50 Gy / 50 Gy Prescribed Fxs (Delivered / Prescribed): 25 / 25   Plan Name: CW_R_Bst_BO Site: Chest Wall, Right Technique: Electron Mode: Electron Dose Per Fraction: 2 Gy Prescribed Dose (Delivered / Prescribed): 14 Gy / 14 Gy Prescribed Fxs (Delivered / Prescribed): 7 / 7   Plan Name: CW_R Site: Chest Wall, Right Technique: 3D Mode: Photon Dose Per Fraction: 2 Gy Prescribed Dose (Delivered / Prescribed): 24 Gy / 24 Gy Prescribed Fxs (Delivered / Prescribed): 12 / 12     CHIEF COMPLIANT: Follow-up to review the results of recent CT scans for metastatic breast cancer  HISTORY OF PRESENT ILLNESS:   History of Present Illness Caitlin [name] is a 68 year old female with lymphadenopathy who presents for follow-up of new nodules and lymph node changes.  A small nodule is present under her left arm, with uncertainty regarding its muscular or lymphatic origin. Imaging shows a new 1.4 cm nodule in the neck and increased size of mediastinal lymph nodes. No axillary adenopathy is observed, and the nodules under the arm are located lower than the axillary region.  Lymphedema remains stable, confirmed by a recent SOZO test with favorable results. Weight loss is attributed to healthy eating and regular exercise. 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 Her mother,  previously under her care, is hospitalized following a hip fracture.     ALLERGIES:  is allergic to penicillins.  MEDICATIONS:  Current Outpatient Medications  Medication Sig Dispense Refill   albuterol  (VENTOLIN  HFA) 108 (90 Base) MCG/ACT inhaler Inhale 2 puffs into the lungs every 4 (four) hours as needed for shortness of breath (Asthma).     alendronate  (FOSAMAX ) 70 MG tablet Take 1 tablet (70 mg total) by mouth once a week. Take with a full glass of water on an empty stomach. 12 tablet 3   aspirin 81 MG tablet Take 81 mg by mouth daily.       diltiazem  (CARDIZEM  CD) 120 MG 24 hr capsule Take 120 mg by mouth daily.     levothyroxine  (SYNTHROID ) 75 MCG tablet Take 75 mcg by mouth daily before breakfast.     Multiple Vitamins-Minerals (MULTIVITAMIN WITH MINERALS) tablet Take 1 tablet by mouth daily.     OVER THE COUNTER MEDICATION Take 1 capsule by mouth 2 (two) times daily. copaiba softgels     PULMICORT  FLEXHALER 180 MCG/ACT inhaler Inhale 1 puff into the lungs daily.     No current facility-administered medications for this visit.    PHYSICAL EXAMINATION: ECOG PERFORMANCE STATUS: 1 - Symptomatic but completely ambulatory  Vitals:   05/31/24 1000  BP: 98/68  Pulse: 71  Resp: 16  Temp: 97.9 F (36.6 C)  SpO2: 99%   Filed Weights   05/31/24 1000  Weight: 144 lb 6.4 oz (65.5 kg)    Physical Exam BREAST: No axillary  adenopathy.  (exam performed in the presence of a chaperone)  LABORATORY DATA:  I have reviewed the data as listed    Latest Ref Rng & Units 05/31/2024    9:34 AM 02/15/2024    8:36 AM 10/20/2023    9:09 AM  CMP  Glucose 70 - 99 mg/dL 82  70  86   BUN 8 - 23 mg/dL 14  14  21    Creatinine 0.44 - 1.00 mg/dL 9.06  9.03  9.13   Sodium 135 - 145 mmol/L 139  139  139   Potassium 3.5 - 5.1 mmol/L 4.4  4.1  4.2   Chloride 98 - 111 mmol/L 105  106  106   CO2 22 - 32 mmol/L 28  30  27    Calcium 8.9 - 10.3 mg/dL 9.1  9.3  9.7   Total Protein 6.5 - 8.1 g/dL 6.8   6.9  6.6   Total Bilirubin 0.0 - 1.2 mg/dL 0.4  0.3  0.6   Alkaline Phos 38 - 126 U/L 53  79  114   AST 15 - 41 U/L 24  16  17    ALT 0 - 44 U/L 19  13  11      Lab Results  Component Value Date   WBC 5.5 05/31/2024   HGB 12.7 05/31/2024   HCT 39.0 05/31/2024   MCV 95.8 05/31/2024   PLT 279 05/31/2024   NEUTROABS 3.6 05/31/2024    ASSESSMENT & PLAN:  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female, estrogen receptor positive (HCC) 10/14/2022:Right lumpectomy: 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  No distant metastatic disease. Bone scan 10/29/2022: No evidence of bone metastasis   11/08/2022 and 11/18/2022: Margin reexcision surgeries: Final margin focal positivity.  After much discussion the plan is to finish her systemic treatment and come back at a later point to do mastectomy.   Treatment summary: Adjuvant chemotherapy with dose dense Adriamycin  and Cytoxan  followed by Taxol  to be completed 05/02/2023, Keytruda  05/25/2023-08/17/2023 (discontinued due to immune mediated adverse effects: Skin rash) Mastectomy by Dr. Ebbie 05/03/2023:Right mastectomy: 8 cm grade 3 IDC with extensive lymphatic and vascular invasion RCB class III, deep margin multifocally positive, 5/5 lymph nodes positive, ER weak 10%, PR 0%, HER2 0, Ki-67 30%, total lymph nodes: 10/10 (prior lumpectomy she had 5 positive nodes) Adjuvant radiation to start 06/09/2023-07/21/23 Capecitabine   started 05/25/2023-11/25/2023 ------------------------------------------------------------------------------------------------------------------------------------------------ Guardant reveal 12/14/2023: CT DNA detected 2.7% 02/16/2024: CT CAP: Interval enlargement of left superior mediastinal lymph node 2 x 1.8 cm concerning for metastatic disease 02/26/2024: PET/CT: Few hypermetabolic lymph nodes identified consistent with metastatic disease including left upper mediastinal, prevascular, left internal mammary and right axillary region CT CAP 05/22/2024: New left internal jugular lymph node 1.4 cm.  Enlarging mediastinal lymph nodes 1.2 cm (was 0.4 cm)   Right axillary lymph node biopsy: Positive for metastatic ductal carcinoma ER 0%, PR 0%, HER2 0, Ki-67 50% Guardant 360 12/26/2023: T p53 present, TMB 5.69, MSI high not detected  Decision: Patient does not want to proceed with any further systemic  treatment options. Radiation felt that there could be potential for more radiation if she desires. I discussed with the patient that based upon the CT findings there appears to be a slow progression of disease and hence it is reasonable to continue to watch and monitor without any interventions and recheck with scans in 3 months. ------------------------------------- Assessment and Plan Assessment & Plan Malignant neoplasm of right breast with stable mediastinal and new left jugular lymphadenopathy CT scan showed mediastinal lymph nodes with increased size and a left jugular nodule. Lungs have radiation-induced scar tissue, no new lesions. Liver cysts benign, no bone lesions. Condition stable, possibly due to prior immunotherapy or endogenous control. - Continue current management. - Schedule follow-up CT scan for end of November or December to reassess lymphadenopathy.  Lymphedema, improved Lymphedema improved per recent SOZO test. - Continue current management.      Orders Placed This Encounter   Procedures   CT CHEST ABDOMEN PELVIS W CONTRAST    Standing Status:   Future    Expected Date:   08/31/2024    Expiration Date:   05/31/2025    If indicated for the ordered procedure, I authorize the administration of contrast media per Radiology protocol:   Yes    Does the patient have a contrast media/X-ray dye allergy?:   No    Preferred imaging location?:   [facility]    Release to patient:   Immediate    If indicated for the ordered procedure, I authorize the administration of oral contrast media per Radiology protocol:   No    Reason for no oral contrast::   breast   The patient has a good understanding of the overall plan. she agrees with it. she will call with any problems that may develop before the next visit here. Total time spent: 30 mins including face to face time and time spent for planning, charting and co-ordination of care   Naomi MARLA Chad, MD 05/31/24

## 2024-07-09 ENCOUNTER — Encounter: Payer: Self-pay | Admitting: Hematology and Oncology

## 2024-07-11 ENCOUNTER — Encounter: Payer: Self-pay | Admitting: Hematology and Oncology

## 2024-07-11 ENCOUNTER — Inpatient Hospital Stay: Attending: Hematology and Oncology | Admitting: Adult Health

## 2024-07-11 VITALS — BP 123/80 | HR 76 | Temp 97.4°F | Resp 18 | Ht 63.0 in | Wt 142.0 lb

## 2024-07-11 DIAGNOSIS — Z9221 Personal history of antineoplastic chemotherapy: Secondary | ICD-10-CM | POA: Diagnosis not present

## 2024-07-11 DIAGNOSIS — Z1732 Human epidermal growth factor receptor 2 negative status: Secondary | ICD-10-CM | POA: Insufficient documentation

## 2024-07-11 DIAGNOSIS — J948 Other specified pleural conditions: Secondary | ICD-10-CM | POA: Diagnosis not present

## 2024-07-11 DIAGNOSIS — Z1722 Progesterone receptor negative status: Secondary | ICD-10-CM | POA: Insufficient documentation

## 2024-07-11 DIAGNOSIS — C773 Secondary and unspecified malignant neoplasm of axilla and upper limb lymph nodes: Secondary | ICD-10-CM | POA: Diagnosis not present

## 2024-07-11 DIAGNOSIS — G893 Neoplasm related pain (acute) (chronic): Secondary | ICD-10-CM | POA: Insufficient documentation

## 2024-07-11 DIAGNOSIS — M79621 Pain in right upper arm: Secondary | ICD-10-CM | POA: Insufficient documentation

## 2024-07-11 DIAGNOSIS — J9811 Atelectasis: Secondary | ICD-10-CM | POA: Insufficient documentation

## 2024-07-11 DIAGNOSIS — Z79899 Other long term (current) drug therapy: Secondary | ICD-10-CM | POA: Diagnosis not present

## 2024-07-11 DIAGNOSIS — Z170 Estrogen receptor positive status [ER+]: Secondary | ICD-10-CM | POA: Insufficient documentation

## 2024-07-11 DIAGNOSIS — C50411 Malignant neoplasm of upper-outer quadrant of right female breast: Secondary | ICD-10-CM | POA: Insufficient documentation

## 2024-07-11 DIAGNOSIS — J910 Malignant pleural effusion: Secondary | ICD-10-CM | POA: Insufficient documentation

## 2024-07-11 DIAGNOSIS — Z923 Personal history of irradiation: Secondary | ICD-10-CM | POA: Insufficient documentation

## 2024-07-11 DIAGNOSIS — R590 Localized enlarged lymph nodes: Secondary | ICD-10-CM

## 2024-07-11 DIAGNOSIS — Z8042 Family history of malignant neoplasm of prostate: Secondary | ICD-10-CM | POA: Insufficient documentation

## 2024-07-11 DIAGNOSIS — R591 Generalized enlarged lymph nodes: Secondary | ICD-10-CM

## 2024-07-11 DIAGNOSIS — I890 Lymphedema, not elsewhere classified: Secondary | ICD-10-CM | POA: Insufficient documentation

## 2024-07-11 DIAGNOSIS — Z9011 Acquired absence of right breast and nipple: Secondary | ICD-10-CM | POA: Diagnosis not present

## 2024-07-11 NOTE — Progress Notes (Signed)
 [facility] Cancer Follow up:    Caitlin Jerel MATSU, MD [address] [place] [address]   DIAGNOSIS: Cancer Staging  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female, estrogen receptor positive (HCC) Staging form: Breast, AJCC 8th Edition - Clinical: Stage IB (cT1c, cN0, cM0, G3, ER+, PR-, HER2-) - Signed by Odean Potts, MD on 09/25/2022 Histologic grading system: 3 grade system - Pathologic: Stage IIIC (pT1c, pN2a, cM0, G3, ER-, PR-, HER2-) - Signed by Odean Potts, MD on 10/21/2022 Stage prefix: Initial diagnosis Histologic grading system: 3 grade system    SUMMARY OF ONCOLOGIC HISTORY: Oncology History  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female, estrogen receptor positive (HCC)  09/15/2022 Initial Diagnosis   Screening mammogram detected right breast mass which measured 1.4 cm by ultrasound at 10 o'clock position, biopsy revealed grade 3 IDC with lymphovascular invasion ER 10%, PR 0%, Ki-67 30%, HER2 0 negative   09/25/2022 Cancer Staging   Staging form: Breast, AJCC 8th Edition - Clinical: Stage IB (cT1c, cN0, cM0, G3, ER+, PR-, HER2-) - Signed by Odean Potts, MD on 09/25/2022 Histologic grading system: 3 grade system    Genetic Testing   [facility] CancerNext-Expanded Panel+RNA was Negative. Report date is 10/06/2021.  The CancerNext-Expanded gene panel offered by [facility] and includes sequencing, rearrangement, and RNA analysis for the following 77 genes: AIP, ALK, APC, ATM, AXIN2, BAP1, BARD1, BLM, BMPR1A, BRCA1, BRCA2, BRIP1, CDC73, CDH1, CDK4, CDKN1B, CDKN2A, CHEK2, CTNNA1, DICER1, FANCC, FH, FLCN, GALNT12, KIF1B, LZTR1, MAX, MEN1, MET, MLH1, MSH2, MSH3, MSH6, MUTYH, NBN, NF1, NF2, NTHL1, PALB2, PHOX2B, PMS2, POT1, PRKAR1A, PTCH1, PTEN, RAD51C, RAD51D, RB1, RECQL, RET, SDHA, SDHAF2, SDHB, SDHC, SDHD, SMAD4, SMARCA4, SMARCB1, SMARCE1, STK11, SUFU, TMEM127, TP53, TSC1, TSC2, VHL and XRCC2 (sequencing and  deletion/duplication); EGFR, EGLN1, HOXB13, KIT, MITF, PDGFRA, POLD1, and POLE (sequencing only); EPCAM and GREM1 (deletion/duplication only).    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 with high-grade DCIS, inferior margin positive, angiolymphatic invasion present, 5/5 lymph nodes positive, additional medial margin: Poorly differentiated carcinoma with lymphoid stroma, additional inferior margin: Positive, ER 10% weak, PR 0%, HER2 0, Ki-67 30%   10/21/2022 Cancer Staging   Staging form: Breast, AJCC 8th Edition - Pathologic: Stage IIIC (pT1c, pN2a, cM0, G3, ER-, PR-, HER2-) - Signed by Odean Potts, MD on 10/21/2022 Stage prefix: Initial diagnosis Histologic grading system: 3 grade system   11/08/2022 Surgery   Margin reexcision: Inferior margin: Grade 3 IDC, new margin positive Medial margin: Grade 3 IDC new margin widely involved   11/18/2022 Surgery   Right inferior margin reexcision: Invasive poorly differentiated adenocarcinoma grade 3 present in new inferior margin.  Medial margin reexcision: Invasive poorly differentiated adenocarcinoma grade 3 present at new medial margin, posterior margin: Poorly differentiated adenocarcinoma grade 3, margin negative  11/08/2022 and 11/18/2022: Final margin focal positivity. After much discussion the plan is to finish her systemic treatment and come back at a later point to do mastectomy.    12/02/2022 - 04/18/2023 Chemotherapy   Patient is on Treatment Plan : BREAST ADJUVANT DOSE DENSE AC q14d / PACLitaxel  q7d     05/03/2023 Surgery   Right mastectomy: 8 cm grade 3 IDC with extensive lymphatic and vascular invasion RCB class III, deep margin multifocally positive, 5/5 lymph nodes positive, ER weak 10%, PR 0%, HER2 0, Ki-67 30%   05/25/2023 - 08/17/2023 Chemotherapy   Patient is  on Treatment Plan : BREAST Pembrolizumab  (200) q21d x 24 months     05/25/2023 -   Adjuvant Chemotherapy   Capectiabine 1000mg  po BID 2 weeks on, 1 week off; began 05/25/2023, second cycle delayed to 07/06/2023   06/08/2023 - 07/21/2023 Radiation Therapy   First Treatment Date: 2023-06-08 - Last Treatment Date: 2023-07-21   Plan Name: CW_R_BO Site: Chest Wall, Right Technique: 3D Mode: Photon Dose Per Fraction: 2 Gy Prescribed Dose (Delivered / Prescribed): 26 Gy / 26 Gy Prescribed Fxs (Delivered / Prescribed): 13 / 13   Plan Name: CW_R_SCV_PAB Site: Chest Wall, Right Technique: 3D Mode: Photon Dose Per Fraction: 2 Gy Prescribed Dose (Delivered / Prescribed): 50 Gy / 50 Gy Prescribed Fxs (Delivered / Prescribed): 25 / 25   Plan Name: CW_R_Bst_BO Site: Chest Wall, Right Technique: Electron Mode: Electron Dose Per Fraction: 2 Gy Prescribed Dose (Delivered / Prescribed): 14 Gy / 14 Gy Prescribed Fxs (Delivered / Prescribed): 7 / 7   Plan Name: CW_R Site: Chest Wall, Right Technique: 3D Mode: Photon Dose Per Fraction: 2 Gy Prescribed Dose (Delivered / Prescribed): 24 Gy / 24 Gy Prescribed Fxs (Delivered / Prescribed): 12 / 12     CURRENT THERAPY: observation  INTERVAL HISTORY:  Discussed the use of AI scribe software for clinical note transcription with the patient, who gave verbal consent to proceed.  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  She experiences right axillary pain, worsened by arm movement, with partial relief from Aleve and Tylenol . She also has elbow pain that occasionally radiates to her shoulder or wrist but is usually localized. She has been using a compression sleeve for about a week and a half. 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  She last attended physical therapy some time ago. She has been experiencing axillary pain since her last visit with Dr. Gudena, however it has changed and she has a new nodule she is concerned about.       Patient Active Problem List   Diagnosis Date Noted   S/P mastectomy, right 05/03/2023   Port-A-Cath in place 12/16/2022   Genetic testing 10/11/2022   Appendicitis with peritonitis 09/29/2022   Family history of prostate cancer 09/29/2022   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female, estrogen receptor positive (HCC) 09/22/2022   Atrial fibrillation with RVR (HCC) 11/30/2010   Chronic anticoagulation 11/30/2010   Hypothyroidism 11/30/2010   PALPITATIONS 11/27/2010    is allergic to penicillins.  MEDICAL HISTORY: Past Medical History:  Diagnosis Date   Asthma    Breast cancer (HCC)    right   Complication of anesthesia    Dyspnea    Dysrhythmia    2012   Headache    History of kidney stones    passed   Hypothyroidism    Pneumonia 01/26/2023   PONV (postoperative nausea and vomiting)    rt breast ca 08/2022   Right Breast   Scoliosis    Thyroid  disease     SURGICAL HISTORY: Past Surgical History:  Procedure Laterality Date   APPENDECTOMY     AXILLARY SENTINEL NODE BIOPSY Right 10/14/2022   Procedure: RIGHT AXILLARY SENTINEL NODE BIOPSY;  Surgeon: Ebbie Cough, MD;  Location: [facility] OR;  Service: General;  Laterality: Right;   BREAST  BIOPSY Right 09/15/2022   US  RT BREAST BX W LOC DEV 1ST LESION IMG BX SPEC US  GUIDE 09/15/2022 [facility]   BREAST BIOPSY  10/12/2022   US  RT RADIOACTIVE SEED LOC 10/12/2022 [facility]MO[facility]   BREAST LUMPECTOMY WITH RADIOACTIVE SEED AND SENTINEL LYMPH NODE BIOPSY Right 10/14/2022   Procedure: RIGHT BREAST LUMPECTOMY WITH RADIOACTIVE SEED;  Surgeon: Ebbie Cough, MD;  Location: [facility] OR;  Service: General;  Laterality: Right;   COLONOSCOPY     LAPAROSCOPIC APPENDECTOMY     MASTECTOMY Right     PORT-A-CATH REMOVAL Left 05/03/2023   Procedure: REMOVAL PORT-A-CATH;  Surgeon: Ebbie Cough, MD;  Location: [facility] OR;  Service: General;  Laterality: Left;   PORT-A-CATH REMOVAL Left 12/22/2023   Procedure: REMOVAL PORT-A-CATH;  Surgeon: Ebbie Cough, MD;  Location: [facility];  Service: General;  Laterality: Left;   PORTACATH PLACEMENT Left 10/14/2022   Procedure: INSERTION PORT-A-CATH;  Surgeon: Ebbie Cough, MD;  Location: [facility] OR;  Service: General;  Laterality: Left;   PORTACATH PLACEMENT Left 05/19/2023   Procedure: PORT PLACEMENT WITH ULTRASOUND GUIDANCE;  Surgeon: Ebbie Cough, MD;  Location: [facility];  Service: General;  Laterality: Left;   RE-EXCISION OF BREAST LUMPECTOMY Right 11/08/2022   Procedure: RE-EXCISION OF RIGHT BREAST LUMPECTOMY;  Surgeon: Ebbie Cough, MD;  Location: [facility];  Service: General;  Laterality: Right;   RE-EXCISION OF BREAST LUMPECTOMY Right 11/18/2022   Procedure: RE-EXCISION OF RIGHT BREAST LUMPECTOMY;  Surgeon: Ebbie Cough, MD;  Location: [facility] OR;  Service: General;  Laterality: Right;   SIMPLE MASTECTOMY WITH AXILLARY SENTINEL NODE BIOPSY Right 05/03/2023   Procedure: RIGHT MASTECTOMY;  Surgeon: Ebbie Cough, MD;  Location: [facility] OR;  Service: General;  Laterality: Right;  GEN & PEC BLOCK   WISDOM TOOTH EXTRACTION      SOCIAL HISTORY: Social History   Socioeconomic History   Marital status: Married    Spouse name: Not on file   Number of children: Not on file   Years of education: Not on file   Highest education level: Not on file  Occupational History   Not on file  Tobacco Use   Smoking status: Never   Smokeless tobacco: Not on file  Vaping Use   Vaping status: Never Used  Substance and Sexual Activity   Alcohol use: Not Currently   Drug use: Never   Sexual activity: Not Currently  Other Topics Concern   Not on file  Social History Narrative   Not on  file   Social Drivers of Health   Financial Resource Strain: Not on file  Food Insecurity: No Food Insecurity (05/27/2023)   Hunger Vital Sign    Worried About Running Out of Food in the Last Year: Never true    Ran Out of Food in the Last Year: Never true  Transportation Needs: No Transportation Needs (05/27/2023)   PRAPARE - Transportation    Lack of Transportation (Medical): No    Lack of Transportation (Non-Medical): No  Physical Activity: Sufficiently Active (06/19/2020)   Received from [facility]   Exercise Vital Sign    On average, how many days per week do you engage in moderate to strenuous exercise (like a brisk walk)?: 7 days    On average, how many minutes do you engage in exercise at this level?: 30 min  Stress: Not on file  Social Connections: Not on file  Intimate Partner Violence: Not At Risk (05/27/2023)   Humiliation, Afraid, Rape, and Kick  questionnaire    Fear of Current or Ex-Partner: No    Emotionally Abused: No    Physically Abused: No    Sexually Abused: No    FAMILY HISTORY: Family History  Problem Relation Age of Onset   Thyroid  disease Mother    Prostate cancer Father 81       metastatic   Hypertension Father    Thyroid  disease Sister        twin sister age 25   Asthma Sister    Prostate cancer Brother 46   Heart attack Maternal Grandmother        died age 73's   Heart attack Maternal Grandfather        died age 71's   Clotting disorder Paternal Grandmother        cerebral hemorrage    Review of Systems  Constitutional:  Negative for appetite change, chills, fatigue, fever and unexpected weight change.  HENT:   Negative for hearing loss, lump/mass and trouble swallowing.   Eyes:  Negative for eye problems and icterus.  Respiratory:  Negative for chest tightness, cough and shortness of breath.   Cardiovascular:  Negative for chest pain, leg swelling and palpitations.  Gastrointestinal:  Negative for abdominal distention, abdominal pain,  constipation, diarrhea, nausea and vomiting.  Endocrine: Negative for hot flashes.  Genitourinary:  Negative for difficulty urinating.   Musculoskeletal:  Negative for arthralgias.  Skin:  Negative for itching and rash.  Neurological:  Negative for dizziness, extremity weakness, headaches and numbness.  Hematological:  Negative for adenopathy. Does not bruise/bleed easily.  Psychiatric/Behavioral:  Negative for depression. The patient is not nervous/anxious.       PHYSICAL EXAMINATION    Vitals:   07/11/24 1041  BP: 123/80  Pulse: 76  Resp: 18  Temp: (!) 97.4 F (36.3 C)  SpO2: 96%    Physical Exam Constitutional:      General: She is not in acute distress.    Appearance: Normal appearance. She is not toxic-appearing.  HENT:     Head: Normocephalic and atraumatic.     Mouth/Throat:     Mouth: Mucous membranes are moist.     Pharynx: Oropharynx is clear. No oropharyngeal exudate or posterior oropharyngeal erythema.  Eyes:     General: No scleral icterus. Cardiovascular:     Rate and Rhythm: Normal rate and regular rhythm.     Pulses: Normal pulses.     Heart sounds: Normal heart sounds.  Pulmonary:     Effort: Pulmonary effort is normal.     Breath sounds: Normal breath sounds.  Chest:     Comments: Right axillary hard and asymmetric nodule noted Abdominal:     General: Abdomen is flat. Bowel sounds are normal. There is no distension.     Palpations: Abdomen is soft.     Tenderness: There is no abdominal tenderness.  Musculoskeletal:        General: No swelling.     Cervical back: Neck supple.  Lymphadenopathy:     Cervical: No cervical adenopathy.     Upper Body:     Right upper body: Axillary adenopathy present.     Left upper body: No supraclavicular adenopathy.  Skin:    General: Skin is warm and dry.     Findings: No rash.  Neurological:     General: No focal deficit present.     Mental Status: She is alert.  Psychiatric:        Mood and Affect:  Mood normal.  Behavior: Behavior normal.       ASSESSMENT and THERAPY PLAN:   Assessment and Plan Assessment & Plan Right axillary and right upper extremity pain Intermittent pain in the right axillary region and right upper extremity, possibly fluid-related. Differential includes overuse and lymphedema-related fluid fluctuation. Current pain management provides some relief. - Schedule ultrasound of the right axillary region and upper extremity to assess for lymph node involvement and potential fluid-related issues. - Consider biopsy if ultrasound findings indicate lymph node involvement. - Continue current pain management with Aleve and Tylenol . - Discuss potential use of tramadol  for pain management if current regimen becomes insufficient.  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 Wearing a compression sleeve daily for management. - Refer to physical therapy for evaluation of lymphedema and fluid status. - Continue wearing compression sleeve daily.  Stage IIIC triple negative right breast invasive ductal carcinoma, post-mastectomy and adjuvant therapy Stage IIIC triple negative right breast invasive ductal carcinoma, status post mastectomy, adjuvant chemotherapy, maintenance Keytruda , and adjuvant radiation. Recent imaging did not reveal significant findings, but ongoing monitoring is necessary due to new symptoms. - Avoid unnecessary radiation exposure; prioritize ultrasound for current evaluation.   All questions were answered. The patient knows to call the clinic with any problems, questions or concerns. We can certainly see the patient much sooner if necessary.  Total encounter time:40 minutes*in face-to-face visit time, chart review, lab review, care coordination, order entry, and documentation of the encounter time.    Morna Kendall, NP 07/11/24 10:52 AM Medical Oncology  and Hematology [facility] [address] [place], [place] [address] Tel. [phone]    Fax. [phone]  *Total Encounter Time as defined by the Centers for Medicare and Medicaid Services includes, in addition to the face-to-face time of a patient visit (documented in the note above) non-face-to-face time: obtaining and reviewing outside history, ordering and reviewing medications, tests or procedures, care coordination (communications with other health care professionals or caregivers) and documentation in the medical record.

## 2024-07-11 NOTE — Therapy (Incomplete)
 OUTPATIENT PHYSICAL THERAPY  UPPER EXTREMITY ONCOLOGY EVALUATION  Patient Name: Caitlin [name] MRN: [id number] DOB:12/25/1955, 68 y.o., female Today's Date: 07/11/2024  END OF SESSION:    Past Medical History:  Diagnosis Date   Asthma    Breast cancer (HCC)    right   Complication of anesthesia    Dyspnea    Dysrhythmia    2012   Headache    History of kidney stones    passed   Hypothyroidism    Pneumonia 01/26/2023   PONV (postoperative nausea and vomiting)    rt breast ca 08/2022   Right Breast   Scoliosis    Thyroid  disease    Past Surgical History:  Procedure Laterality Date   APPENDECTOMY     AXILLARY SENTINEL NODE BIOPSY Right 10/14/2022   Procedure: RIGHT AXILLARY SENTINEL NODE BIOPSY;  Surgeon: Ebbie Cough, MD;  Location: [facility] OR;  Service: General;  Laterality: Right;   BREAST BIOPSY Right 09/15/2022   US  RT BREAST BX W LOC DEV 1ST LESION IMG BX SPEC US  GUIDE 09/15/2022 [facility]   BREAST BIOPSY  10/12/2022   US  RT RADIOACTIVE SEED LOC 10/12/2022 [facility]   BREAST LUMPECTOMY WITH RADIOACTIVE SEED AND SENTINEL LYMPH NODE BIOPSY Right 10/14/2022   Procedure: RIGHT BREAST LUMPECTOMY WITH RADIOACTIVE SEED;  Surgeon: Ebbie Cough, MD;  Location: [facility] OR;  Service: General;  Laterality: Right;   COLONOSCOPY     LAPAROSCOPIC APPENDECTOMY     MASTECTOMY Right    PORT-A-CATH REMOVAL Left 05/03/2023   Procedure: REMOVAL PORT-A-CATH;  Surgeon: Ebbie Cough, MD;  Location: [facility] OR;  Service: General;  Laterality: Left;   PORT-A-CATH REMOVAL Left 12/22/2023   Procedure: REMOVAL PORT-A-CATH;  Surgeon: Ebbie Cough, MD;  Location: [facility];  Service: General;  Laterality: Left;   PORTACATH PLACEMENT Left 10/14/2022   Procedure: INSERTION PORT-A-CATH;  Surgeon: Ebbie Cough, MD;  Location: [facility] OR;  Service: General;  Laterality: Left;   PORTACATH PLACEMENT Left 05/19/2023   Procedure: PORT PLACEMENT WITH  ULTRASOUND GUIDANCE;  Surgeon: Ebbie Cough, MD;  Location: [facility];  Service: General;  Laterality: Left;   RE-EXCISION OF BREAST LUMPECTOMY Right 11/08/2022   Procedure: RE-EXCISION OF RIGHT BREAST LUMPECTOMY;  Surgeon: Ebbie Cough, MD;  Location:  SURGERY CENTER;  Service: General;  Laterality: Right;   RE-EXCISION OF BREAST LUMPECTOMY Right 11/18/2022   Procedure: RE-EXCISION OF RIGHT BREAST LUMPECTOMY;  Surgeon: Ebbie Cough, MD;  Location: [facility] OR;  Service: General;  Laterality: Right;   SIMPLE MASTECTOMY WITH AXILLARY SENTINEL NODE BIOPSY Right 05/03/2023   Procedure: RIGHT MASTECTOMY;  Surgeon: Ebbie Cough, MD;  Location: [facility] OR;  Service: General;  Laterality: Right;  GEN & PEC BLOCK   WISDOM TOOTH EXTRACTION     Patient Active Problem List   Diagnosis Date Noted   S/P mastectomy, right 05/03/2023   Port-A-Cath in place 12/16/2022   Genetic testing 10/11/2022   Appendicitis with peritonitis 09/29/2022   Family history of prostate cancer 09/29/2022   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female, estrogen receptor positive (HCC) 09/22/2022   Atrial fibrillation with RVR (HCC) 11/30/2010   Chronic anticoagulation 11/30/2010   Hypothyroidism 11/30/2010   PALPITATIONS 11/27/2010    PCP: Jerel Sieving MD  REFERRING PROVIDER: Morna Kendall, NP  REFERRING DIAG: C50.411,Z17.0 (ICD-10-CM) - 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female, estrogen receptor positive (HCC)  THERAPY DIAG:  No diagnosis found.  ONSET DATE: 05/03/23  Rationale  for Evaluation and Treatment: Rehabilitation  SUBJECTIVE:                                                                                                                                                                                           SUBJECTIVE STATEMENT:   PERTINENT HISTORY:   10/14/22:   Rt lumpectomy and SLNB with 5/5 nodes  positive 11/08/22:   Re excision of Rt lumpectomy 11/18/22: Re excision of Rt lumpectomy  2/29/4: Chemotherapy ACP 05/03/23 Rt mastectomy with positive margins 5/5 nodes, 07/21/23: completed radiation 05/22/24: disease progression with new low left jugular and mediastinal adenopathy. No bone METS  PAIN:  Are you having pain? No  PRECAUTIONS: Other: at risk of lymphedema  RED FLAGS: None   WEIGHT BEARING RESTRICTIONS: No  FALLS:  Has patient fallen in last 6 months? 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  LEISURE: walks from house to barn, is active throughout the day  HAND DOMINANCE: right   PRIOR LEVEL OF FUNCTION: Independent  PATIENT GOALS: to get more range of motion of R shoulder   OBJECTIVE:  COGNITION: Overall cognitive status: Within functional limits for tasks assessed   PALPATION: Several cords palpable in R axilla extending to scar and down upper arm  OBSERVATIONS / OTHER ASSESSMENTS: steri strips still in place over healing mastectomy scar, cording visible in R axilla with abduction  POSTURE: rounded shoulders, forward head  UPPER EXTREMITY AROM/PROM:  A/PROM RIGHT   eval   Shoulder extension 77  Shoulder flexion 110  Shoulder abduction 116  Shoulder internal rotation 75  Shoulder external rotation 57    (Blank rows = not tested)  A/PROM LEFT   eval  Shoulder extension 67  Shoulder flexion 157  Shoulder abduction 158  Shoulder internal rotation 62  Shoulder external rotation 83    (Blank rows = not tested)   UPPER EXTREMITY STRENGTH:   LYMPHEDEMA ASSESSMENTS:   LANDMARK RIGHT  eval  At axilla  33  15 cm proximal to olecranon process 29.5  10 cm proximal to olecranon process 27  Olecranon process 23.5  15 cm proximal to ulnar styloid process 23.1  10 cm proximal to ulnar styloid process 20  Just proximal to  ulnar styloid process 15.6  Across hand at thumb web space 20.9  At base of 2nd digit 6.5  (Blank rows = not tested)  LANDMARK LEFT  eval  At axilla  32.5  15 cm proximal to olecranon process 29.1  10 cm proximal to olecranon process 27.5  Olecranon process 23.9  15 cm proximal to ulnar styloid process 22.7  10 cm proximal to ulnar styloid process 19.7  Just proximal to ulnar styloid process 15.2  Across hand at thumb web space 19  At base of 2nd digit 6.2  (Blank rows = not tested)    QUICK DASH SURVEY:       TODAY'S TREATMENT:                                                                                                                                          DATE:     PATIENT EDUCATION:  Education details: post op exercises, cording Person educated: Patient Education method: Programmer, multimedia, Demonstration, and Handouts Education comprehension: verbalized understanding and returned demonstration  HOME EXERCISE PROGRAM: Post op exercises  ASSESSMENT:  CLINICAL IMPRESSION: Patient is a 68 y.o. female who was seen today for physical therapy evaluation and treatment for cording in R axilla, decreased R shoulder ROM, and decreased scar mobility. Pt has a history of R lumpectomy and SLNB on 10/14/22 followed by chemo and then radiation.  10/14/22: R breast lumpectomy 5/5 nodes, 11/08/22: Re excision of R lumpectomy, 11/18/22 - Re excision of R lumpectomy,completed neoadjuvant, 05/03/23 R mastectomy with positive margins 5/5 nodes. She is beginning radiation next week. Pt has cording palpable in R axilla and decreased R shoulder ROM. Pt would benefit from skilled PT services to improve R shoulder ROM and decrease cording to allow improved function.    OBJECTIVE IMPAIRMENTS: decreased ROM, decreased strength, increased fascial restrictions, impaired UE functional use, and postural dysfunction.   ACTIVITY LIMITATIONS: carrying, lifting, and reach over head  PARTICIPATION  LIMITATIONS: meal prep, laundry, and yard work  PERSONAL FACTORS: 1 comorbidity: previous history of R lumpectomy are also affecting patient's functional outcome.   REHAB POTENTIAL: Good  CLINICAL DECISION MAKING: Stable/uncomplicated  EVALUATION COMPLEXITY: Low  GOALS: Goals reviewed with patient? Yes  SHORT TERM GOALS=LONG TERM GOALS Target date: 06/29/23  Pt will demonstrate 150 degrees of R shoulder flexion to allow pt to reach overhead.  Baseline: 110 Goal status: INITIAL  2.  Pt will demonstrate 150 degrees of R shoulder abduction to allow her to reach out to the side. Baseline: 116 Goal status: INITIAL  3.  Pt will report a 75% improvement in cording in R axilla to allow improved comfort.  Baseline:  Goal status: INITIAL  4.  Pt will be independent in a home exercise program for continued strengthening and stretching.  Baseline:  Goal status: INITIAL  PLAN:  PT FREQUENCY: 2x/week  PT DURATION: 4 weeks  PLANNED INTERVENTIONS: Therapeutic exercises, Therapeutic activity, Patient/Family education, Self Care, Orthotic/Fit training, Manual lymph drainage, scar mobilization, Taping, Vasopneumatic device, and Manual therapy  PLAN FOR NEXT SESSION: MFR to cording in R axilla, PROM to R shoulder, pulleys, ball  Karyss Frese, Saddie SAUNDERS, PT 07/11/2024, 12:36 PM

## 2024-07-12 ENCOUNTER — Other Ambulatory Visit: Payer: Self-pay

## 2024-07-12 ENCOUNTER — Encounter: Payer: Self-pay | Admitting: Hematology and Oncology

## 2024-07-12 ENCOUNTER — Ambulatory Visit: Attending: Adult Health | Admitting: Rehabilitation

## 2024-07-12 DIAGNOSIS — C50411 Malignant neoplasm of upper-outer quadrant of right female breast: Secondary | ICD-10-CM | POA: Insufficient documentation

## 2024-07-12 DIAGNOSIS — R293 Abnormal posture: Secondary | ICD-10-CM | POA: Insufficient documentation

## 2024-07-12 DIAGNOSIS — M25611 Stiffness of right shoulder, not elsewhere classified: Secondary | ICD-10-CM | POA: Insufficient documentation

## 2024-07-12 DIAGNOSIS — Z170 Estrogen receptor positive status [ER+]: Secondary | ICD-10-CM | POA: Diagnosis not present

## 2024-07-12 DIAGNOSIS — Z483 Aftercare following surgery for neoplasm: Secondary | ICD-10-CM | POA: Diagnosis not present

## 2024-07-12 DIAGNOSIS — R591 Generalized enlarged lymph nodes: Secondary | ICD-10-CM | POA: Diagnosis not present

## 2024-07-12 NOTE — Therapy (Signed)
 OUTPATIENT PHYSICAL THERAPY  UPPER EXTREMITY ONCOLOGY EVALUATION  Patient Name: Caitlin [name] MRN: [id number] DOB:[birth year], 68 y.o., female Today's Date: 07/12/2024  END OF SESSION:  PT End of Session - 07/12/24 1152     Date for Recertification  08/09/24    Authorization Type none    PT Start Time 0800    PT Stop Time 0857    PT Time Calculation (min) 57 min    Activity Tolerance Patient tolerated treatment well    Behavior During Therapy [facility] for tasks assessed/performed           Past Medical History:  Diagnosis Date   Asthma    Breast cancer (HCC)    right   Complication of anesthesia    Dyspnea    Dysrhythmia    2012   Headache    History of kidney stones    passed   Hypothyroidism    Pneumonia 01/26/2023   PONV (postoperative nausea and vomiting)    rt breast ca 08/2022   Right Breast   Scoliosis    Thyroid  disease    Past Surgical History:  Procedure Laterality Date   APPENDECTOMY     AXILLARY SENTINEL NODE BIOPSY Right 10/14/2022   Procedure: RIGHT AXILLARY SENTINEL NODE BIOPSY;  Surgeon: Ebbie Cough, MD;  Location: [facility] OR;  Service: General;  Laterality: Right;   BREAST BIOPSY Right 09/15/2022   US  RT BREAST BX W LOC DEV 1ST LESION IMG BX SPEC US  GUIDE 09/15/2022 [facility]MO[facility]   BREAST BIOPSY  10/12/2022   US  RT RADIOACTIVE SEED LOC 10/12/2022 [facility]   BREAST LUMPECTOMY WITH RADIOACTIVE SEED AND SENTINEL LYMPH NODE BIOPSY Right 10/14/2022   Procedure: RIGHT BREAST LUMPECTOMY WITH RADIOACTIVE SEED;  Surgeon: Ebbie Cough, MD;  Location: [facility] OR;  Service: General;  Laterality: Right;   COLONOSCOPY     LAPAROSCOPIC APPENDECTOMY     MASTECTOMY Right    PORT-A-CATH REMOVAL Left 05/03/2023   Procedure: REMOVAL PORT-A-CATH;  Surgeon: Ebbie Cough, MD;  Location[facility] OR;  Service: General;  Laterality: Left;   PORT-A-CATH REMOVAL Left 12/22/2023   Procedure: REMOVAL PORT-A-CATH;  Surgeon: Ebbie Cough, MD;   Location: [facility];  Service: General;  Laterality: Left;   PORTACATH PLACEMENT Left 10/14/2022   Procedure: INSERTION PORT-A-CATH;  Surgeon: Ebbie Cough, MD;  Location: [facility] OR;  Service: General;  Laterality: Left;   PORTACATH PLACEMENT Left 05/19/2023   Procedure: PORT PLACEMENT WITH ULTRASOUND GUIDANCE;  Surgeon: Ebbie Cough, MD;  Location: [facility];  Service: General;  Laterality: Left;   RE-EXCISION OF BREAST LUMPECTOMY Right 11/08/2022   Procedure: RE-EXCISION OF RIGHT BREAST LUMPECTOMY;  Surgeon: Ebbie Cough, MD;  Location: [facility];  Service: General;  Laterality: Right;   RE-EXCISION OF BREAST LUMPECTOMY Right 11/18/2022   Procedure: RE-EXCISION OF RIGHT BREAST LUMPECTOMY;  Surgeon: Ebbie Cough, MD;  Location: [facility] OR;  Service: General;  Laterality: Right;   SIMPLE MASTECTOMY WITH AXILLARY SENTINEL NODE BIOPSY Right 05/03/2023   Procedure: RIGHT MASTECTOMY;  Surgeon: Ebbie Cough, MD;  Location: [facility] OR;  Service: General;  Laterality: Right;  GEN & PEC BLOCK   WISDOM TOOTH EXTRACTION     Patient Active Problem List   Diagnosis Date Noted   S/P mastectomy, right 05/03/2023   Port-A-Cath in place 12/16/2022   Genetic testing 10/11/2022   Appendicitis with peritonitis 09/29/2022   Family history of prostate cancer 09/29/2022   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female,  estrogen receptor positive (HCC) 09/22/2022   Atrial fibrillation with RVR (HCC) 11/30/2010   Chronic anticoagulation 11/30/2010   Hypothyroidism 11/30/2010   PALPITATIONS 11/27/2010    PCP: Jerel Sieving MD  REFERRING PROVIDER: Lynwood Nasuti, MD  REFERRING DIAG: [phone] (ICD-10-CM) - 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female, estrogen receptor positive (HCC)  THERAPY DIAG:  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female, estrogen receptor positive  (HCC)  Aftercare following surgery for neoplasm  Stiffness of right shoulder, not elsewhere classified  Abnormal posture  ONSET DATE: 05/03/23  Rationale for Evaluation and Treatment: Rehabilitation  SUBJECTIVE:                                                                                                                                                                                           SUBJECTIVE STATEMENT: Pt has a lump lower under her armpit that she is having an ultrasound and biopsy tomorrow. The patient reports she is having swelling and tenderness in her Rt armpit that got worse about a month ago. She stopped doing MLD when her mom was sick; her son, who is a Land, was in town for her [employer] funeral and he worked on it which helped. The patient has been wearing the compression sleeve every day for about half the day; she has a Juzo soft 20-30 size 2 short compression sleeve. Earlier in the week she started having pain in her Rt elbow - sometimes the pain moves to the shoulder or wrist. The patient states she has been unable to sweep/mop and has trouble with lifting the water bucket for her horses. Patient is currently in the process of moving.   PERTINENT HISTORY:   10/14/22:   Rt lumpectomy and SLNB with 5/5 nodes positive 11/08/22:   Re excision of Rt lumpectomy 11/18/22: Re excision of Rt lumpectomy  2/29/4: Chemotherapy ACP 05/03/23 Rt mastectomy with positive margins 5/5 nodes, 07/21/23: completed radiation 05/22/24: disease progression with new low left jugular and mediastinal adenopathy. No bone METS  PAIN:  PAIN:  Are you having pain? Yes: NPRS scale: Yes Pain location: Rt elbow and Rt axilla  Pain description: Achy at the elbow, tender at the axilla Aggravating factors: Gets worse as the day goes on Relieving factors: Tylenol , MLD    PRECAUTIONS: Other: at risk of lymphedema  RED FLAGS: None   WEIGHT BEARING RESTRICTIONS: No  FALLS:  Has patient  fallen in last 6 months? No  LIVING ENVIRONMENT:/ Lives with: lives with their family Lives in: House/apartment Has following equipment at home: None  OCCUPATION: retired   LEISURE: walks from house  to barn to feed her horses, had been doing a full body exercise program every day up until a few weeks ago - discontinued due to shoulder/axilla pain   HAND DOMINANCE: right   PRIOR LEVEL OF FUNCTION: Independent  PATIENT GOALS: to get more range of motion of R shoulder   OBJECTIVE:  COGNITION: Overall cognitive status: Within functional limits for tasks assessed   PALPATION: Several cords palpable in R axilla extending to scar and down upper arm  OBSERVATIONS / OTHER ASSESSMENTS: steri strips still in place over healing mastectomy scar, cording visible in R axilla with abduction  POSTURE: rounded shoulders, forward head  UPPER EXTREMITY AROM/PROM:  A/PROM RIGHT   eval  RIGHT 07/12/24  Shoulder extension 77 23  Shoulder flexion 110 93  Shoulder abduction 116 87  Shoulder internal rotation 75   Shoulder external rotation 57     (Blank rows = not tested)  A/PROM LEFT   eval  Shoulder extension 67  Shoulder flexion 157  Shoulder abduction 158  Shoulder internal rotation 62  Shoulder external rotation 83    (Blank rows = not tested)   UPPER EXTREMITY STRENGTH:   LYMPHEDEMA ASSESSMENTS:  SURGERY TYPE/DATE: R mastectomy and SLNB on 10/14/22 followed by chemo and then radiation.  10/14/22: R breast lumpectomy 5/5, 11/08/22: Re excision of R lumpectomy, 11/18/22 - Re excision of R lumpectomy,completed neoadjuvant, 05/03/23 R mastectomy with positive margins 5/5 nodes, NUMBER OF LYMPH NODES REMOVED: see above CHEMOTHERAPY: completed neoadjuvant RADIATION:beginning 06/08/23  LYMPHEDEMA ASSESSMENTS:   LANDMARK RIGHT  eval RIGHT 07/12/24  At axilla  33 31.5  15 cm proximal to olecranon process 29.5 30.4  10 cm proximal to olecranon process 27 28.2  Olecranon process 23.5 23.9   15 cm proximal to ulnar styloid process 23.1 23.1  10 cm proximal to ulnar styloid process 20 19.8  Just proximal to ulnar styloid process 15.6 15.6  Across hand at thumb web space 20.9 19.3  At base of 2nd digit 6.5 6.5  (Blank rows = not tested)  Total volume at initial evaluation: 389.65 cm Total volume at 07/12/24 evaluation: 387.27 cm Volume change: ?2.38 cm Percentage change: ?0.61% From copilot   LANDMARK LEFT  eval LEFT  At axilla  32.5 31.5  15 cm proximal to olecranon process 29.1 28.4  10 cm proximal to olecranon process 27.5 27.6  Olecranon process 23.9 23.4  15 cm proximal to ulnar styloid process 22.7 22.9  10 cm proximal to ulnar styloid process 19.7 19.8  Just proximal to ulnar styloid process 15.2 15.4  Across hand at thumb web space 19 19.8  At base of 2nd digit 6.2 6.3  (Blank rows = not tested)  Initial volume: 379.14 cm Volume on 07/12/24: 373.49 cm Volume change: ?5.64 cm Percentage change: ?1.49% From copilot   QUICK DASH SURVEY:   Junie Palin - 07/12/24 0001     Open a tight or new jar Mild difficulty    Do heavy household chores (wash walls, wash floors) Moderate difficulty    Carry a shopping bag or briefcase Moderate difficulty    Wash your back Severe difficulty    Use a knife to cut food No difficulty    Recreational activities in which you take some force or impact through your arm, shoulder, or hand (golf, hammering, tennis) Moderate difficulty    During the past week, to what extent has your arm, shoulder or hand problem interfered with your normal social activities with family, friends, neighbors, or  groups? Modererately    During the past week, to what extent has your arm, shoulder or hand problem limited your work or other regular daily activities Modererately    Arm, shoulder, or hand pain. Moderate    Tingling (pins and needles) in your arm, shoulder, or hand None    Difficulty Sleeping Moderate difficulty    DASH Score 40.91 %         40.91   TODAY'S TREATMENT:                                                                                                                                          DATE:  07/12/24 - pt education & provided HEP    PATIENT EDUCATION:  Education details: POC, HEP, importance of wearing compression sleeve for at least 10-12 hours per day Person educated: Patient Education method: Explanation, Demonstration, and Handouts Education comprehension: verbalized understanding and returned demonstration  HOME EXERCISE PROGRAM: Access Code: [email or web address] URL: [email or web address] Date: 07/12/2024 Prepared by: Delon Pack  Exercises - Seated Scapular Retraction  - 1 x daily - 7 x weekly - 3 sets - 10 reps - Seated Shoulder Flexion Towel Slide at Table Top  - 1 x daily - 7 x weekly - 3 sets - 10 reps - Seated Shoulder Scaption Slide at Table Top with Forearm in Neutral  - 1 x daily - 7 x weekly - 3 sets - 10 reps - Supine Shoulder Flexion AAROM with Hands Clasped  - 1 x daily - 7 x weekly - 3 sets - 10 reps  ASSESSMENT:  CLINICAL IMPRESSION: Patient is a 68 y.o. female who was seen today for physical therapy evaluation and treatment for pain and swelling in the Rt axilla. The patient has a new lump on her Rt lateral side inferior to the axilla, near the lateral aspect of the ribcage; she has an ultrasound and biopsy schedule for tomorrow, 07/13/2024. A SOZO screen is completed during the session, with the patient increasing into the red zone with a score of 8.9. Patient education is provided regarding the increase in SOZO screen and the importance of wearing her compression sleeve for 10-12 hours a day when she is awake in an effort to decrease the swelling. Rt shoulder AROM is decreased in all directions, with ER and IR unable to be assessed due to the lack of shoulder abduction. The patient would benefit from continued skilled physical therapy for improved shoulder AROM,  decreased pain, and MLD.    OBJECTIVE IMPAIRMENTS: decreased ROM, decreased strength, hypomobility, increased fascial restrictions, impaired UE functional use, postural dysfunction, and pain.   ACTIVITY LIMITATIONS: carrying, lifting, bathing, reach over head, and hygiene/grooming  PARTICIPATION LIMITATIONS: meal prep, cleaning, laundry, yard work, and difficulty with feeding and giving her horses water  PERSONAL FACTORS: 1 comorbidity: previous history of R lumpectomy are also affecting patient's functional outcome.   REHAB POTENTIAL:  Good  CLINICAL DECISION MAKING: Stable/uncomplicated  EVALUATION COMPLEXITY: Low  GOALS: Goals reviewed with patient? Yes  SHORT TERM GOALS=LONG TERM GOALS Target date: 08/09/24  Pt will improve shoulder AROM in all directions for improved functional mobility to at least baseline  Baseline:  Goal status: INITIAL  2.  Pt will demonstrate an improvement in Quick-DASH score from 40.91 to <10 in order to return to baseline function  Baseline: 40.91  Goal status: INITIAL  3.  Pt will compliant with wearing her compression sleeve to decrease swelling and return SOZO score back to baseline  Baseline:  Goal status: INITIAL  4.  Pt will be independent in a home exercise program for continued strengthening and stretching.  Baseline:  Goal status: INITIAL  PLAN:  PT FREQUENCY: 2x/week  PT DURATION: 4 weeks  PLANNED INTERVENTIONS: Therapeutic exercises, Therapeutic activity, Patient/Family education, Self Care, Orthotic/Fit training, Manual lymph drainage, scar mobilization, Taping, Vasopneumatic device, and Manual therapy  PLAN FOR NEXT SESSION: biopsy results? Teach and perform Rt UE MLD, STM to Rt upper quadrant PRN, PROM to R shoulder, pulleys, ball  Randall Pack, SPT 07/12/2024, 11:53 AM  I agree with the following treatment note after reviewing documentation. This session was performed under the supervision of a licensed clinician.  Saddie Raw, PT 07/12/24, 12:03 PM

## 2024-07-13 ENCOUNTER — Ambulatory Visit
Admission: RE | Admit: 2024-07-13 | Discharge: 2024-07-13 | Disposition: A | Discharge status: Home / Self Care | Source: Ambulatory Visit | Attending: Adult Health | Admitting: Adult Health

## 2024-07-13 ENCOUNTER — Other Ambulatory Visit: Payer: Self-pay | Admitting: Adult Health

## 2024-07-13 DIAGNOSIS — R591 Generalized enlarged lymph nodes: Secondary | ICD-10-CM

## 2024-07-13 DIAGNOSIS — C50411 Malignant neoplasm of upper-outer quadrant of right female breast: Secondary | ICD-10-CM

## 2024-07-13 DIAGNOSIS — R590 Localized enlarged lymph nodes: Secondary | ICD-10-CM

## 2024-07-13 DIAGNOSIS — N6331 Unspecified lump in axillary tail of the right breast: Secondary | ICD-10-CM | POA: Diagnosis not present

## 2024-07-13 DIAGNOSIS — R2231 Localized swelling, mass and lump, right upper limb: Secondary | ICD-10-CM | POA: Diagnosis not present

## 2024-07-13 DIAGNOSIS — Z9011 Acquired absence of right breast and nipple: Secondary | ICD-10-CM

## 2024-07-15 ENCOUNTER — Encounter: Payer: Self-pay | Admitting: Hematology and Oncology

## 2024-07-16 ENCOUNTER — Other Ambulatory Visit: Payer: Self-pay | Admitting: Adult Health

## 2024-07-16 DIAGNOSIS — Z170 Estrogen receptor positive status [ER+]: Secondary | ICD-10-CM

## 2024-07-16 LAB — SURGICAL PATHOLOGY

## 2024-07-16 NOTE — Progress Notes (Signed)
 CT chest abd pelvis ordered and bone scan as pathology results confirm metastatic breast cancer recurrence in axilla.  Attempted to call patient.  Will try again later today.   Morna Kendall, NP 07/16/24 3:23 PM Medical Oncology and Hematology [facility] [address] [place], [place] [address] Tel. [phone]    Fax. [phone]

## 2024-07-17 ENCOUNTER — Ambulatory Visit: Admitting: Rehabilitation

## 2024-07-18 ENCOUNTER — Inpatient Hospital Stay (HOSPITAL_BASED_OUTPATIENT_CLINIC_OR_DEPARTMENT_OTHER): Admitting: Adult Health

## 2024-07-18 ENCOUNTER — Ambulatory Visit

## 2024-07-18 VITALS — BP 128/74 | HR 75 | Temp 98.2°F | Resp 16 | Wt 138.8 lb

## 2024-07-18 DIAGNOSIS — R293 Abnormal posture: Secondary | ICD-10-CM

## 2024-07-18 DIAGNOSIS — Z483 Aftercare following surgery for neoplasm: Secondary | ICD-10-CM

## 2024-07-18 DIAGNOSIS — Z170 Estrogen receptor positive status [ER+]: Secondary | ICD-10-CM | POA: Diagnosis not present

## 2024-07-18 DIAGNOSIS — C50411 Malignant neoplasm of upper-outer quadrant of right female breast: Secondary | ICD-10-CM

## 2024-07-18 DIAGNOSIS — M25611 Stiffness of right shoulder, not elsewhere classified: Secondary | ICD-10-CM

## 2024-07-18 DIAGNOSIS — C773 Secondary and unspecified malignant neoplasm of axilla and upper limb lymph nodes: Secondary | ICD-10-CM | POA: Diagnosis not present

## 2024-07-18 NOTE — Therapy (Signed)
 OUTPATIENT PHYSICAL THERAPY  UPPER EXTREMITY ONCOLOGY TREATMENT  Patient Name: Caitlin [name] MRN: [id number] DOB:Jun 24, 1956, 68 y.o., female Today's Date: 07/18/2024  END OF SESSION:  PT End of Session - 07/18/24 1504     Visit Number 2    Number of Visits 9    Date for Recertification  08/09/24    Authorization Type none    PT Start Time 1502    PT Stop Time 1558    PT Time Calculation (min) 56 min    Activity Tolerance Patient tolerated treatment well    Behavior During Therapy [facility] for tasks assessed/performed           Past Medical History:  Diagnosis Date   Asthma    Breast cancer (HCC)    right   Complication of anesthesia    Dyspnea    Dysrhythmia    2012   Headache    History of kidney stones    passed   Hypothyroidism    Pneumonia 01/26/2023   PONV (postoperative nausea and vomiting)    rt breast ca 08/2022   Right Breast   Scoliosis    Thyroid  disease    Past Surgical History:  Procedure Laterality Date   APPENDECTOMY     AXILLARY SENTINEL NODE BIOPSY Right 10/14/2022   Procedure: RIGHT AXILLARY SENTINEL NODE BIOPSY;  Surgeon: Ebbie Cough, MD;  Location: [facility] OR;  Service: General;  Laterality: Right;   BREAST BIOPSY Right 09/15/2022   US  RT BREAST BX W LOC DEV 1ST LESION IMG BX SPEC US  GUIDE 09/15/2022 [facility]   BREAST BIOPSY  10/12/2022   US  RT RADIOACTIVE SEED LOC 10/12/2022 [facility]   BREAST LUMPECTOMY WITH RADIOACTIVE SEED AND SENTINEL LYMPH NODE BIOPSY Right 10/14/2022   Procedure: RIGHT BREAST LUMPECTOMY WITH RADIOACTIVE SEED;  Surgeon: Ebbie Cough, MD;  Location: [facility] OR;  Service: General;  Laterality: Right;   COLONOSCOPY     LAPAROSCOPIC APPENDECTOMY     MASTECTOMY Right    PORT-A-CATH REMOVAL Left 05/03/2023   Procedure: REMOVAL PORT-A-CATH;  Surgeon: Ebbie Cough, MD;  Location: [facility] OR;  Service: General;  Laterality: Left;   PORT-A-CATH REMOVAL Left 12/22/2023   Procedure: REMOVAL PORT-A-CATH;   Surgeon: Ebbie Cough, MD;  Location: [facility];  Service: General;  Laterality: Left;   PORTACATH PLACEMENT Left 10/14/2022   Procedure: INSERTION PORT-A-CATH;  Surgeon: Ebbie Cough, MD;  Location: [facility] OR;  Service: General;  Laterality: Left;   PORTACATH PLACEMENT Left 05/19/2023   Procedure: PORT PLACEMENT WITH ULTRASOUND GUIDANCE;  Surgeon: Ebbie Cough, MD;  Location: [facility];  Service: General;  Laterality: Left;   RE-EXCISION OF BREAST LUMPECTOMY Right 11/08/2022   Procedure: RE-EXCISION OF RIGHT BREAST LUMPECTOMY;  Surgeon: Ebbie Cough, MD;  Location: [facility];  Service: General;  Laterality: Right;   RE-EXCISION OF BREAST LUMPECTOMY Right 11/18/2022   Procedure: RE-EXCISION OF RIGHT BREAST LUMPECTOMY;  Surgeon: Ebbie Cough, MD;  Location: [facility] OR;  Service: General;  Laterality: Right;   SIMPLE MASTECTOMY WITH AXILLARY SENTINEL NODE BIOPSY Right 05/03/2023   Procedure: RIGHT MASTECTOMY;  Surgeon: Ebbie Cough, MD;  Location: [facility] OR;  Service: General;  Laterality: Right;  GEN & PEC BLOCK   WISDOM TOOTH EXTRACTION     Patient Active Problem List   Diagnosis Date Noted   S/P mastectomy, right 05/03/2023   Port-A-Cath in place 12/16/2022   Genetic testing 10/11/2022   Appendicitis with peritonitis 09/29/2022   Family history of prostate cancer  09/29/2022   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female, estrogen receptor positive (HCC) 09/22/2022   Atrial fibrillation with RVR (HCC) 11/30/2010   Chronic anticoagulation 11/30/2010   Hypothyroidism 11/30/2010   PALPITATIONS 11/27/2010    PCP: Jerel Sieving MD  REFERRING PROVIDER: Lynwood Nasuti, MD  REFERRING DIAG: [phone] (ICD-10-CM) - 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female, estrogen receptor positive (HCC)  THERAPY DIAG:  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female, estrogen receptor positive (HCC)  Aftercare following surgery for neoplasm  Stiffness of right shoulder, not elsewhere classified  Abnormal posture  ONSET DATE: 05/03/23  Rationale for Evaluation and Treatment: Rehabilitation  SUBJECTIVE:                                                                                                                                                                                           SUBJECTIVE STATEMENT: I had to get a biopsy of the new bump under my arm and they found out it is 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 Dr. Gudena is considering an infusion but we don't need to figure that out right away. But they are going to wait until my next scan in Nov to decide what next treatment options they'll explore depending on those results.   PERTINENT HISTORY:   10/14/22:   Rt lumpectomy and SLNB with 5/5 nodes positive 11/08/22:   Re excision of Rt lumpectomy 11/18/22: Re excision of Rt lumpectomy  2/29/4: Chemotherapy ACP 05/03/23 Rt mastectomy with positive margins 5/5 nodes, 07/21/23: completed radiation 05/22/24: disease progression with new low left jugular and mediastinal adenopathy. No bone METS  PAIN:  PAIN:  Are you having pain? Yes: NPRS scale: Yes Pain location: Rt elbow and Rt axilla  Pain description: Achy at the elbow, tender at the axilla Aggravating factors: Gets worse as the day goes on Relieving factors: Tylenol , MLD    PRECAUTIONS: Other: at risk of lymphedema  RED FLAGS: None   WEIGHT BEARING RESTRICTIONS: No  FALLS:  Has patient fallen in last 6 months? No  LIVING ENVIRONMENT:/ Lives with: lives with their family Lives in: House/apartment Has following equipment at home: None  OCCUPATION: retired   LEISURE: walks from house to barn to feed her horses, had been doing a full body exercise program every day up until a few weeks ago - discontinued due to shoulder/axilla pain   HAND DOMINANCE: right    PRIOR LEVEL OF FUNCTION: Independent  PATIENT GOALS: to get more range of motion of R shoulder   OBJECTIVE:  COGNITION: Overall cognitive status: Within functional limits  for tasks assessed   PALPATION: Several cords palpable in R axilla extending to scar and down upper arm  OBSERVATIONS / OTHER ASSESSMENTS: steri strips still in place over healing mastectomy scar, cording visible in R axilla with abduction  POSTURE: rounded shoulders, forward head  UPPER EXTREMITY AROM/PROM:  A/PROM RIGHT   eval  RIGHT 07/12/24  Shoulder extension 77 23  Shoulder flexion 110 93  Shoulder abduction 116 87  Shoulder internal rotation 75   Shoulder external rotation 57     (Blank rows = not tested)  A/PROM LEFT   eval  Shoulder extension 67  Shoulder flexion 157  Shoulder abduction 158  Shoulder internal rotation 62  Shoulder external rotation 83    (Blank rows = not tested)   UPPER EXTREMITY STRENGTH:   LYMPHEDEMA ASSESSMENTS:  SURGERY TYPE/DATE: R mastectomy and SLNB on 10/14/22 followed by chemo and then radiation.  10/14/22: R breast lumpectomy 5/5, 11/08/22: Re excision of R lumpectomy, 11/18/22 - Re excision of R lumpectomy,completed neoadjuvant, 05/03/23 R mastectomy with positive margins 5/5 nodes, NUMBER OF LYMPH NODES REMOVED: see above CHEMOTHERAPY: completed neoadjuvant RADIATION:beginning 06/08/23  LYMPHEDEMA ASSESSMENTS:   LANDMARK RIGHT  eval RIGHT 07/12/24  At axilla  33 31.5  15 cm proximal to olecranon process 29.5 30.4  10 cm proximal to olecranon process 27 28.2  Olecranon process 23.5 23.9  15 cm proximal to ulnar styloid process 23.1 23.1  10 cm proximal to ulnar styloid process 20 19.8  Just proximal to ulnar styloid process 15.6 15.6  Across hand at thumb web space 20.9 19.3  At base of 2nd digit 6.5 6.5  (Blank rows = not tested)  Total volume at initial evaluation: 389.65 cm Total volume at 07/12/24 evaluation: 387.27 cm Volume change: ?2.38  cm Percentage change: ?0.61% From copilot   LANDMARK LEFT  eval LEFT  At axilla  32.5 31.5  15 cm proximal to olecranon process 29.1 28.4  10 cm proximal to olecranon process 27.5 27.6  Olecranon process 23.9 23.4  15 cm proximal to ulnar styloid process 22.7 22.9  10 cm proximal to ulnar styloid process 19.7 19.8  Just proximal to ulnar styloid process 15.2 15.4  Across hand at thumb web space 19 19.8  At base of 2nd digit 6.2 6.3  (Blank rows = not tested)  Initial volume: 379.14 cm Volume on 07/12/24: 373.49 cm Volume change: ?5.64 cm Percentage change: ?1.49% From copilot   QUICK DASH SURVEY:   40.91   TODAY'S TREATMENT:                                                                                                                                          DATE:  07/18/24: Manual Therapy MLD to Rt UE reviewing with pt while performing: Short neck, superficial and deep abdominals (but reviewed with pt she would do 5 resisted diaphragmatic breaths); Lt axillary  and pectoral nodes, intact anterior thorax sequence, anterior inter-axillary anastomosis, Rt inguinal nodes, Rt axillo-inguinals anastomosis, then Rt UE working from proximal to distal (lateral upper arm, medial to lateral and lateral again, anterior and posterior forearm, and dorsal wrist/hand and fingers) retracing all steps ending with LN's.  P/ROM to Rt shoulder into flex, abd and D2 with gentle scapular depression throughout, VC's to relax during stretching due to muscle guarding STM to Rt pect insertion where pt palpably tight.   07/12/24 - pt education & provided HEP    PATIENT EDUCATION:  Education details: POC, HEP, importance of wearing compression sleeve for at least 10-12 hours per day Person educated: Patient Education method: Explanation, Demonstration, and Handouts Education comprehension: verbalized understanding and returned demonstration  HOME EXERCISE PROGRAM: Access Code: [email or web address] URL:  [email or web address] Date: 07/12/2024 Prepared by: Delon Pack  Exercises - Seated Scapular Retraction  - 1 x daily - 7 x weekly - 3 sets - 10 reps - Seated Shoulder Flexion Towel Slide at Table Top  - 1 x daily - 7 x weekly - 3 sets - 10 reps - Seated Shoulder Scaption Slide at Table Top with Forearm in Neutral  - 1 x daily - 7 x weekly - 3 sets - 10 reps - Supine Shoulder Flexion AAROM with Hands Clasped  - 1 x daily - 7 x weekly - 3 sets - 10 reps  ASSESSMENT:  CLINICAL IMPRESSION: Pt reports she has learned that the new lump under her axilla is a recurrence of her breast cancer so she will have more scans soon. Began MLD reviewing sequence with pt while performing. Also began gentle P/ROM of Rt shoulder to her tolerance. Explained to pt that it would be beneficial to be in compression now as much as able as we yet to know where all the recurrence is and can possibly be causing blockage to the lymph nodes which will cause increased accumulation of lymphatic fluid in her arm. Pt verbalized understanding.    OBJECTIVE IMPAIRMENTS: decreased ROM, decreased strength, hypomobility, increased fascial restrictions, impaired UE functional use, postural dysfunction, and pain.   ACTIVITY LIMITATIONS: carrying, lifting, bathing, reach over head, and hygiene/grooming  PARTICIPATION LIMITATIONS: meal prep, cleaning, laundry, yard work, and difficulty with feeding and giving her horses water  PERSONAL FACTORS: 1 comorbidity: previous history of R lumpectomy are also affecting patient's functional outcome.   REHAB POTENTIAL: Good  CLINICAL DECISION MAKING: Stable/uncomplicated  EVALUATION COMPLEXITY: Low  GOALS: Goals reviewed with patient? Yes  SHORT TERM GOALS=LONG TERM GOALS Target date: 08/09/24  Pt will improve shoulder AROM in all directions for improved functional mobility to at least baseline  Baseline:  Goal status: INITIAL  2.  Pt will demonstrate an improvement  in Quick-DASH score from 40.91 to <10 in order to return to baseline function  Baseline: 40.91  Goal status: INITIAL  3.  Pt will compliant with wearing her compression sleeve to decrease swelling and return SOZO score back to baseline  Baseline:  Goal status: INITIAL  4.  Pt will be independent in a home exercise program for continued strengthening and stretching.  Baseline:  Goal status: INITIAL  PLAN:  PT FREQUENCY: 2x/week  PT DURATION: 4 weeks  PLANNED INTERVENTIONS: Therapeutic exercises, Therapeutic activity, Patient/Family education, Self Care, Orthotic/Fit training, Manual lymph drainage, scar mobilization, Taping, Vasopneumatic device, and Manual therapy  PLAN FOR NEXT SESSION: Cont and review Rt UE MLD, STM to Rt upper quadrant PRN, PROM to R shoulder, pulleys, ball   Berwyn  Aden, PTA 07/18/24 5:32 PM

## 2024-07-18 NOTE — Patient Instructions (Addendum)
 Cancer Rehab [phone]  Start with circles near neck reaching above collarbones and circling towards your neck 5 times.   Deep Effective Breath   Standing, sitting, or laying down, place both hands on the belly. Take a deep breath IN, expanding the belly; then breath OUT, contracting the belly. Repeat __5__ times. Do __2-3__ sessions per day and before your self massage.    Axilla to Axilla - Pump   On uninvolved side make 5 circles in the armpit, then pump _5__ times from involved armpit across chest to uninvolved armpit, making a pathway. Do _1__ time per day.  Copyright  VHI. All rights reserved.  Axilla to Inguinal Nodes - Sweep   On involved side, make 5 circles at groin at panty line, then pump _5__ times from armpit along side of trunk to outer hip, making your other pathway. Do __1_ time per day.  Copyright  VHI. All rights reserved.  Arm Posterior: Elbow to Shoulder - Sweep   Pump _5__ times from back of elbow to top of shoulder. Then inner to outer upper arm _5_ times, then outer arm again _5_ times. Then back to the pathways _2-3_ times. Do _1__ time per day.  Copyright  VHI. All rights reserved.  ARM: Volar Wrist to Elbow - Sweep   Pump or stationary circles _5__ times from wrist to elbow making sure to do both sides of the forearm. Then retrace your steps to the outer arm, and the pathways _2-3_ times each. Do _1__ time per day.  Copyright  VHI. All rights reserved.  ARM: Dorsum of Hand to Shoulder - Sweep   Pump or stationary circles _5__ times on back of hand including knuckle spaces and individual fingers if needed working up towards the wrist, then retrace all your steps working back up the forearm, doing both sides; upper outer arm and back to your pathways _2-3_ times each. Then do 5 circles again at uninvolved armpit and involved groin where you started! Good job!! Do __1_ time per day.

## 2024-07-19 ENCOUNTER — Ambulatory Visit: Admitting: Rehabilitation

## 2024-07-19 ENCOUNTER — Encounter: Payer: Self-pay | Admitting: Rehabilitation

## 2024-07-19 DIAGNOSIS — C50411 Malignant neoplasm of upper-outer quadrant of right female breast: Secondary | ICD-10-CM | POA: Diagnosis not present

## 2024-07-19 DIAGNOSIS — Z170 Estrogen receptor positive status [ER+]: Secondary | ICD-10-CM

## 2024-07-19 DIAGNOSIS — R293 Abnormal posture: Secondary | ICD-10-CM

## 2024-07-19 DIAGNOSIS — Z483 Aftercare following surgery for neoplasm: Secondary | ICD-10-CM

## 2024-07-19 DIAGNOSIS — M25611 Stiffness of right shoulder, not elsewhere classified: Secondary | ICD-10-CM

## 2024-07-19 NOTE — Therapy (Signed)
 OUTPATIENT PHYSICAL THERAPY  UPPER EXTREMITY ONCOLOGY TREATMENT  Patient Name: Caitlin [name] MRN: [id number] DOB:29-Dec-1955, 68 y.o., female Today's Date: 07/19/2024  END OF SESSION:  PT End of Session - 07/19/24 1617     Visit Number 3    Number of Visits 9    Date for Recertification  08/09/24    PT Start Time 1400    PT Stop Time 1448    PT Time Calculation (min) 48 min    Activity Tolerance Patient tolerated treatment well    Behavior During Therapy [facility] for tasks assessed/performed            Past Medical History:  Diagnosis Date   Asthma    Breast cancer (HCC)    right   Complication of anesthesia    Dyspnea    Dysrhythmia    2012   Headache    History of kidney stones    passed   Hypothyroidism    Pneumonia 01/26/2023   PONV (postoperative nausea and vomiting)    rt breast ca 08/2022   Right Breast   Scoliosis    Thyroid  disease    Past Surgical History:  Procedure Laterality Date   APPENDECTOMY     AXILLARY SENTINEL NODE BIOPSY Right 10/14/2022   Procedure: RIGHT AXILLARY SENTINEL NODE BIOPSY;  Surgeon: Ebbie Cough, MD;  Location: [facility] OR;  Service: General;  Laterality: Right;   BREAST BIOPSY Right 09/15/2022   US  RT BREAST BX W LOC DEV 1ST LESION IMG BX SPEC US  GUIDE 09/15/2022 [facility]   BREAST BIOPSY  10/12/2022   US  RT RADIOACTIVE SEED LOC 10/12/2022 [facility]   BREAST LUMPECTOMY WITH RADIOACTIVE SEED AND SENTINEL LYMPH NODE BIOPSY Right 10/14/2022   Procedure: RIGHT BREAST LUMPECTOMY WITH RADIOACTIVE SEED;  Surgeon: Ebbie Cough, MD;  Location: [facility] OR;  Service: General;  Laterality: Right;   COLONOSCOPY     LAPAROSCOPIC APPENDECTOMY     MASTECTOMY Right    PORT-A-CATH REMOVAL Left 05/03/2023   Procedure: REMOVAL PORT-A-CATH;  Surgeon: Ebbie Cough, MD;  Location: [facility] OR;  Service: General;  Laterality: Left;   PORT-A-CATH REMOVAL Left 12/22/2023   Procedure: REMOVAL PORT-A-CATH;  Surgeon: Ebbie Cough, MD;  Location: [facility];  Service: General;  Laterality: Left;   PORTACATH PLACEMENT Left 10/14/2022   Procedure: INSERTION PORT-A-CATH;  Surgeon: Ebbie Cough, MD;  Location: [facility] OR;  Service: General;  Laterality: Left;   PORTACATH PLACEMENT Left 05/19/2023   Procedure: PORT PLACEMENT WITH ULTRASOUND GUIDANCE;  Surgeon: Ebbie Cough, MD;  Location: [facility];  Service: General;  Laterality: Left;   RE-EXCISION OF BREAST LUMPECTOMY Right 11/08/2022   Procedure: RE-EXCISION OF RIGHT BREAST LUMPECTOMY;  Surgeon: Ebbie Cough, MD;  Location: [facility];  Service: General;  Laterality: Right;   RE-EXCISION OF BREAST LUMPECTOMY Right 11/18/2022   Procedure: RE-EXCISION OF RIGHT BREAST LUMPECTOMY;  Surgeon: Ebbie Cough, MD;  Location: [facility] OR;  Service: General;  Laterality: Right;   SIMPLE MASTECTOMY WITH AXILLARY SENTINEL NODE BIOPSY Right 05/03/2023   Procedure: RIGHT MASTECTOMY;  Surgeon: Ebbie Cough, MD;  Location: [facility] OR;  Service: General;  Laterality: Right;  GEN & PEC BLOCK   WISDOM TOOTH EXTRACTION     Patient Active Problem List   Diagnosis Date Noted   S/P mastectomy, right 05/03/2023   Port-A-Cath in place 12/16/2022   Genetic testing 10/11/2022   Appendicitis with peritonitis 09/29/2022   Family history of prostate cancer 09/29/2022   Malignant neoplasm  of upper-outer quadrant of right breast in female, estrogen receptor positive (HCC) 09/22/2022   Atrial fibrillation with RVR (HCC) 11/30/2010   Chronic anticoagulation 11/30/2010   Hypothyroidism 11/30/2010   PALPITATIONS 11/27/2010    PCP: Jerel Sieving MD  REFERRING PROVIDER: Lynwood Nasuti, MD  REFERRING DIAG: [phone] (ICD-10-CM) - 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female, estrogen receptor positive (HCC)  THERAPY DIAG:  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female, estrogen receptor  positive (HCC)  Aftercare following surgery for neoplasm  Abnormal posture  Stiffness of right shoulder, not elsewhere classified  ONSET DATE: 05/03/23  Rationale for Evaluation and Treatment: Rehabilitation  SUBJECTIVE:                                                                                                                                                                                           SUBJECTIVE STATEMENT: I was pretty sore after last time but not too painful    PERTINENT HISTORY:   10/14/22:   Rt lumpectomy and SLNB with 5/5 nodes positive 11/08/22:   Re excision of Rt lumpectomy 11/18/22: Re excision of Rt lumpectomy  2/29/4: Chemotherapy ACP 05/03/23 Rt mastectomy with positive margins 5/5 nodes, 07/21/23: completed radiation 05/22/24: disease progression with new low left jugular and mediastinal adenopathy. No bone METS  PAIN:  PAIN:  Are you having pain? Yes: NPRS scale: 1-2/10 Pain location: Rt elbow and Rt axilla  Pain description: Achy at the elbow, tender at the axilla Aggravating factors: Gets worse as the day goes on Relieving factors: Tylenol , MLD    PRECAUTIONS: Other: at risk of lymphedema  RED FLAGS: None   WEIGHT BEARING RESTRICTIONS: No  FALLS:  Has patient fallen in last 6 months? No  LIVING ENVIRONMENT:/ Lives with: lives with their family Lives in: House/apartment Has following equipment at home: None  OCCUPATION: retired   LEISURE: walks from house to barn to feed her horses, had been doing a full body exercise program every day up until a few weeks ago - discontinued due to shoulder/axilla pain   HAND DOMINANCE: right   PRIOR LEVEL OF FUNCTION: Independent  PATIENT GOALS: to get more range of motion of R shoulder   OBJECTIVE:  COGNITION: Overall cognitive status: Within functional limits for tasks assessed   PALPATION: Several cords palpable in R axilla extending to scar and down upper arm  OBSERVATIONS / OTHER  ASSESSMENTS: steri strips still in place over healing mastectomy scar, cording visible in R axilla with abduction  POSTURE: rounded shoulders, forward head  UPPER EXTREMITY AROM/PROM:  A/PROM RIGHT   eval  RIGHT 07/12/24  Shoulder extension 77 23  Shoulder flexion 110 93  Shoulder abduction 116 87  Shoulder internal rotation 75   Shoulder external rotation 57     (Blank rows = not tested)  A/PROM LEFT   eval  Shoulder extension 67  Shoulder flexion 157  Shoulder abduction 158  Shoulder internal rotation 62  Shoulder external rotation 83    (Blank rows = not tested)   UPPER EXTREMITY STRENGTH:   LYMPHEDEMA ASSESSMENTS:  SURGERY TYPE/DATE: R mastectomy and SLNB on 10/14/22 followed by chemo and then radiation.  10/14/22: R breast lumpectomy 5/5, 11/08/22: Re excision of R lumpectomy, 11/18/22 - Re excision of R lumpectomy,completed neoadjuvant, 05/03/23 R mastectomy with positive margins 5/5 nodes, NUMBER OF LYMPH NODES REMOVED: see above CHEMOTHERAPY: completed neoadjuvant RADIATION:beginning 06/08/23  LYMPHEDEMA ASSESSMENTS:   LANDMARK RIGHT  eval RIGHT 07/12/24  At axilla  33 31.5  15 cm proximal to olecranon process 29.5 30.4  10 cm proximal to olecranon process 27 28.2  Olecranon process 23.5 23.9  15 cm proximal to ulnar styloid process 23.1 23.1  10 cm proximal to ulnar styloid process 20 19.8  Just proximal to ulnar styloid process 15.6 15.6  Across hand at thumb web space 20.9 19.3  At base of 2nd digit 6.5 6.5  (Blank rows = not tested)  Total volume at initial evaluation: 389.65 cm Total volume at 07/12/24 evaluation: 387.27 cm Volume change: ?2.38 cm Percentage change: ?0.61% From copilot   LANDMARK LEFT  eval LEFT  At axilla  32.5 31.5  15 cm proximal to olecranon process 29.1 28.4  10 cm proximal to olecranon process 27.5 27.6  Olecranon process 23.9 23.4  15 cm proximal to ulnar styloid process 22.7 22.9  10 cm proximal to ulnar styloid process  19.7 19.8  Just proximal to ulnar styloid process 15.2 15.4  Across hand at thumb web space 19 19.8  At base of 2nd digit 6.2 6.3  (Blank rows = not tested)  Initial volume: 379.14 cm Volume on 07/12/24: 373.49 cm Volume change: ?5.64 cm Percentage change: ?1.49% From copilot   QUICK DASH SURVEY:   40.91   TODAY'S TREATMENT:                                                                                                                                          DATE:  07/19/24 Manual Therapy MLD to Rt UE Short neck, 5 resisted diaphragmatic breaths, Lt axillary and pectoral nodes, anterior inter-axillary anastomosis, Rt inguinal nodes, Rt axillo-inguinals anastomosis, then Rt UE working from proximal to distal (lateral upper arm, medial to lateral and lateral again, anterior and posterior forearm, and dorsal wrist/hand and fingers) retracing all steps ending with LN's.  P/ROM to Rt shoulder into flex, abd and D2 with gentle scapular depression throughout,  STM to Rt pect insertion where pt palpably tight.   07/18/24: Manual Therapy MLD to  Rt UE reviewing with pt while performing: Short neck, superficial and deep abdominals (but reviewed with pt she would do 5 resisted diaphragmatic breaths); Lt axillary and pectoral nodes, intact anterior thorax sequence, anterior inter-axillary anastomosis, Rt inguinal nodes, Rt axillo-inguinals anastomosis, then Rt UE working from proximal to distal (lateral upper arm, medial to lateral and lateral again, anterior and posterior forearm, and dorsal wrist/hand and fingers) retracing all steps ending with LN's.  P/ROM to Rt shoulder into flex, abd and D2 with gentle scapular depression throughout, VC's to relax during stretching due to muscle guarding STM to Rt pect insertion where pt palpably tight.   07/12/24 - pt education & provided HEP    PATIENT EDUCATION:  Education details: POC, HEP, importance of wearing compression sleeve for at least  10-12 hours per day Person educated: Patient Education method: Explanation, Demonstration, and Handouts Education comprehension: verbalized understanding and returned demonstration  HOME EXERCISE PROGRAM: Access Code: [email or web address] URL: [email or web address] Date: 07/12/2024 Prepared by: Delon Pack  Exercises - Seated Scapular Retraction  - 1 x daily - 7 x weekly - 3 sets - 10 reps - Seated Shoulder Flexion Towel Slide at Table Top  - 1 x daily - 7 x weekly - 3 sets - 10 reps - Seated Shoulder Scaption Slide at Table Top with Forearm in Neutral  - 1 x daily - 7 x weekly - 3 sets - 10 reps - Supine Shoulder Flexion AAROM with Hands Clasped  - 1 x daily - 7 x weekly - 3 sets - 10 reps  ASSESSMENT:  CLINICAL IMPRESSION: Continued POC with good tolerance.  Still healing at the biopsy site with a steristrip present   OBJECTIVE IMPAIRMENTS: decreased ROM, decreased strength, hypomobility, increased fascial restrictions, impaired UE functional use, postural dysfunction, and pain.   ACTIVITY LIMITATIONS: carrying, lifting, bathing, reach over head, and hygiene/grooming  PARTICIPATION LIMITATIONS: meal prep, cleaning, laundry, yard work, and difficulty with feeding and giving her horses water  PERSONAL FACTORS: 1 comorbidity: previous history of R lumpectomy are also affecting patient's functional outcome.   REHAB POTENTIAL: Good  CLINICAL DECISION MAKING: Stable/uncomplicated  EVALUATION COMPLEXITY: Low  GOALS: Goals reviewed with patient? Yes  SHORT TERM GOALS=LONG TERM GOALS Target date: 08/09/24  Pt will improve shoulder AROM in all directions for improved functional mobility to at least baseline  Baseline:  Goal status: INITIAL  2.  Pt will demonstrate an improvement in Quick-DASH score from 40.91 to <10 in order to return to baseline function  Baseline: 40.91  Goal status: INITIAL  3.  Pt will compliant with wearing her compression sleeve to decrease swelling  and return SOZO score back to baseline  Baseline:  Goal status: INITIAL  4.  Pt will be independent in a home exercise program for continued strengthening and stretching.  Baseline:  Goal status: INITIAL  PLAN:  PT FREQUENCY: 2x/week  PT DURATION: 4 weeks  PLANNED INTERVENTIONS: Therapeutic exercises, Therapeutic activity, Patient/Family education, Self Care, Orthotic/Fit training, Manual lymph drainage, scar mobilization, Taping, Vasopneumatic device, and Manual therapy  PLAN FOR NEXT SESSION: Cont and review Rt UE MLD, STM to Rt upper quadrant PRN, (avoid Rt lateral trunk with active tumor) PROM to R shoulder, pulleys, ball   Berwyn Knights, PTA 07/19/24 4:18 PM

## 2024-07-20 ENCOUNTER — Encounter: Payer: Self-pay | Admitting: Hematology and Oncology

## 2024-07-20 NOTE — Progress Notes (Signed)
 [facility] Cancer Follow up:    Toribio Jerel MATSU, MD [address] [place] [address]   DIAGNOSIS: Cancer Staging  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female, estrogen receptor positive (HCC) Staging form: Breast, AJCC 8th Edition - Clinical: Stage IB (cT1c, cN0, cM0, G3, ER+, PR-, HER2-) - Signed by Odean Potts, MD on 09/25/2022 Histologic grading system: 3 grade system - Pathologic: Stage IIIC (pT1c, pN2a, cM0, G3, ER-, PR-, HER2-) - Signed by Odean Potts, MD on 10/21/2022 Stage prefix: Initial diagnosis Histologic grading system: 3 grade system    SUMMARY OF ONCOLOGIC HISTORY: Oncology History  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female, estrogen receptor positive (HCC)  09/15/2022 Initial Diagnosis   Screening mammogram detected right breast mass which measured 1.4 cm by ultrasound at 10 o'clock position, biopsy revealed grade 3 IDC with lymphovascular invasion ER 10%, PR 0%, Ki-67 30%, HER2 0 negative   09/25/2022 Cancer Staging   Staging form: Breast, AJCC 8th Edition - Clinical: Stage IB (cT1c, cN0, cM0, G3, ER+, PR-, HER2-) - Signed by Odean Potts, MD on 09/25/2022 Histologic grading system: 3 grade system    Genetic Testing   [facility] CancerNext-Expanded Panel+RNA was Negative. Report date is 10/06/2021.  The CancerNext-Expanded gene panel offered by [facility] and includes sequencing, rearrangement, and RNA analysis for the following 77 genes: AIP, ALK, APC, ATM, AXIN2, BAP1, BARD1, BLM, BMPR1A, BRCA1, BRCA2, BRIP1, CDC73, CDH1, CDK4, CDKN1B, CDKN2A, CHEK2, CTNNA1, DICER1, FANCC, FH, FLCN, GALNT12, KIF1B, LZTR1, MAX, MEN1, MET, MLH1, MSH2, MSH3, MSH6, MUTYH, NBN, NF1, NF2, NTHL1, PALB2, PHOX2B, PMS2, POT1, PRKAR1A, PTCH1, PTEN, RAD51C, RAD51D, RB1, RECQL, RET, SDHA, SDHAF2, SDHB, SDHC, SDHD, SMAD4, SMARCA4, SMARCB1, SMARCE1, STK11, SUFU, TMEM127, TP53, TSC1, TSC2, VHL and XRCC2 (sequencing and  deletion/duplication); EGFR, EGLN1, HOXB13, KIT, MITF, PDGFRA, POLD1, and POLE (sequencing only); EPCAM and GREM1 (deletion/duplication only).    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 with high-grade DCIS, inferior margin positive, angiolymphatic invasion present, 5/5 lymph nodes positive, additional medial margin: Poorly differentiated carcinoma with lymphoid stroma, additional inferior margin: Positive, ER 10% weak, PR 0%, HER2 0, Ki-67 30%   10/21/2022 Cancer Staging   Staging form: Breast, AJCC 8th Edition - Pathologic: Stage IIIC (pT1c, pN2a, cM0, G3, ER-, PR-, HER2-) - Signed by Odean Potts, MD on 10/21/2022 Stage prefix: Initial diagnosis Histologic grading system: 3 grade system   11/08/2022 Surgery   Margin reexcision: Inferior margin: Grade 3 IDC, new margin positive Medial margin: Grade 3 IDC new margin widely involved   11/18/2022 Surgery   Right inferior margin reexcision: Invasive poorly differentiated adenocarcinoma grade 3 present in new inferior margin.  Medial margin reexcision: Invasive poorly differentiated adenocarcinoma grade 3 present at new medial margin, posterior margin: Poorly differentiated adenocarcinoma grade 3, margin negative  11/08/2022 and 11/18/2022: Final margin focal positivity. After much discussion the plan is to finish her systemic treatment and come back at a later point to do mastectomy.    12/02/2022 - 04/18/2023 Chemotherapy   Patient is on Treatment Plan : BREAST ADJUVANT DOSE DENSE AC q14d / PACLitaxel  q7d     05/03/2023 Surgery   Right mastectomy: 8 cm grade 3 IDC with extensive lymphatic and vascular invasion RCB class III, deep margin multifocally positive, 5/5 lymph nodes positive, ER weak 10%, PR 0%, HER2 0, Ki-67 30%   05/25/2023 - 08/17/2023 Chemotherapy   Patient is  on Treatment Plan : BREAST Pembrolizumab  (200) q21d x 24 months     05/25/2023 -   Adjuvant Chemotherapy   Capectiabine 1000mg  po BID 2 weeks on, 1 week off; began 05/25/2023, second cycle delayed to 07/06/2023   06/08/2023 - 07/21/2023 Radiation Therapy   First Treatment Date: 2023-06-08 - Last Treatment Date: 2023-07-21   Plan Name: CW_R_BO Site: Chest Wall, Right Technique: 3D Mode: Photon Dose Per Fraction: 2 Gy Prescribed Dose (Delivered / Prescribed): 26 Gy / 26 Gy Prescribed Fxs (Delivered / Prescribed): 13 / 13   Plan Name: CW_R_SCV_PAB Site: Chest Wall, Right Technique: 3D Mode: Photon Dose Per Fraction: 2 Gy Prescribed Dose (Delivered / Prescribed): 50 Gy / 50 Gy Prescribed Fxs (Delivered / Prescribed): 25 / 25   Plan Name: CW_R_Bst_BO Site: Chest Wall, Right Technique: Electron Mode: Electron Dose Per Fraction: 2 Gy Prescribed Dose (Delivered / Prescribed): 14 Gy / 14 Gy Prescribed Fxs (Delivered / Prescribed): 7 / 7   Plan Name: CW_R Site: Chest Wall, Right Technique: 3D Mode: Photon Dose Per Fraction: 2 Gy Prescribed Dose (Delivered / Prescribed): 24 Gy / 24 Gy Prescribed Fxs (Delivered / Prescribed): 12 / 12     CURRENT THERAPY: observation  INTERVAL HISTORY:  Discussed the use of AI scribe software for clinical note transcription with the patient, who gave verbal consent to proceed.  History of Present Illness Caitlin [name] is a 68 year old female with breast cancer recurrence who presents for evaluation of her condition.  She has a noticeable growth under her arm that is increasing in size and causing discomfort. Her lymphedema is exacerbated, leading to a limited range of motion in her arm, which makes certain procedures uncomfortable.  She underwent biopsy of the area on 07/13/2024 that was positive for triple negative breast cancer.  She is seeing PT regularly.       Patient Active Problem List   Diagnosis Date Noted   S/P mastectomy, right 05/03/2023   Port-A-Cath in place 12/16/2022   Genetic testing 10/11/2022    Appendicitis with peritonitis 09/29/2022   Family history of prostate cancer 09/29/2022   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female, estrogen receptor positive (HCC) 09/22/2022   Atrial fibrillation with RVR (HCC) 11/30/2010   Chronic anticoagulation 11/30/2010   Hypothyroidism 11/30/2010   PALPITATIONS 11/27/2010    is allergic to penicillins.  MEDICAL HISTORY: Past Medical History:  Diagnosis Date   Asthma    Breast cancer (HCC)    right   Complication of anesthesia    Dyspnea    Dysrhythmia    2012   Headache    History of kidney stones    passed   Hypothyroidism    Pneumonia 01/26/2023   PONV (postoperative nausea and vomiting)    rt breast ca 08/2022   Right Breast   Scoliosis    Thyroid  disease     SURGICAL HISTORY: Past Surgical History:  Procedure Laterality Date   APPENDECTOMY     AXILLARY SENTINEL NODE BIOPSY Right 10/14/2022   Procedure: RIGHT AXILLARY SENTINEL NODE BIOPSY;  Surgeon: Ebbie Cough, MD;  Location: [facility] OR;  Service: General;  Laterality: Right;   BREAST BIOPSY Right 09/15/2022   US  RT BREAST BX W LOC DEV 1ST LESION IMG BX SPEC US  GUIDE 09/15/2022 [facility]MO[facility]   BREAST BIOPSY  10/12/2022   US  RT RADIOACTIVE SEED LOC 10/12/2022 [facility] MAMMOGR[facility]HY   BREAST LUMPECTOMY WITH RADIOACTIVE SEED AND SENTINEL LYMPH NODE BIOPSY Right 10/14/2022  Procedure: RIGHT BREAST LUMPECTOMY WITH RADIOACTIVE SEED;  Surgeon: Ebbie Cough, MD;  Location: [facility] OR;  Service: General;  Laterality: Right;   COLONOSCOPY     LAPAROSCOPIC APPENDECTOMY     MASTECTOMY Right    PORT-A-CATH REMOVAL Left 05/03/2023   Procedure: REMOVAL PORT-A-CATH;  Surgeon: Ebbie Cough, MD;  Location: [facility] OR;  Service: General;  Laterality: Left;   PORT-A-CATH REMOVAL Left 12/22/2023   Procedure: REMOVAL PORT-A-CATH;  Surgeon: Ebbie Cough, MD;  Location: [facility];  Service: General;  Laterality: Left;   PORTACATH PLACEMENT  Left 10/14/2022   Procedure: INSERTION PORT-A-CATH;  Surgeon: Ebbie Cough, MD;  Location: [facility] OR;  Service: General;  Laterality: Left;   PORTACATH PLACEMENT Left 05/19/2023   Procedure: PORT PLACEMENT WITH ULTRASOUND GUIDANCE;  Surgeon: Ebbie Cough, MD;  Location: [facility];  Service: General;  Laterality: Left;   RE-EXCISION OF BREAST LUMPECTOMY Right 11/08/2022   Procedure: RE-EXCISION OF RIGHT BREAST LUMPECTOMY;  Surgeon: Ebbie Cough, MD;  Location: [facility];  Service: General;  Laterality: Right;   RE-EXCISION OF BREAST LUMPECTOMY Right 11/18/2022   Procedure: RE-EXCISION OF RIGHT BREAST LUMPECTOMY;  Surgeon: Ebbie Cough, MD;  Location: [facility] OR;  Service: General;  Laterality: Right;   SIMPLE MASTECTOMY WITH AXILLARY SENTINEL NODE BIOPSY Right 05/03/2023   Procedure: RIGHT MASTECTOMY;  Surgeon: Ebbie Cough, MD;  Location: [facility] OR;  Service: General;  Laterality: Right;  GEN & PEC BLOCK   WISDOM TOOTH EXTRACTION      SOCIAL HISTORY: Social History   Socioeconomic History   Marital status: Married    Spouse name: Not on file   Number of children: Not on file   Years of education: Not on file   Highest education level: Not on file  Occupational History   Not on file  Tobacco Use   Smoking status: Never   Smokeless tobacco: Not on file  Vaping Use   Vaping status: Never Used  Substance and Sexual Activity   Alcohol use: Not Currently   Drug use: Never   Sexual activity: Not Currently  Other Topics Concern   Not on file  Social History Narrative   Not on file   Social Drivers of Health   Financial Resource Strain: Not on file  Food Insecurity: No Food Insecurity (05/27/2023)   Hunger Vital Sign    Worried About Running Out of Food in the Last Year: Never true    Ran Out of Food in the Last Year: Never true  Transportation Needs: No Transportation Needs (05/27/2023)   PRAPARE - Transportation    Lack of  Transportation (Medical): No    Lack of Transportation (Non-Medical): No  Physical Activity: Sufficiently Active (06/19/2020)   [facility]   Exercise Vital Sign    On average, how many days per week do you engage in moderate to strenuous exercise (like a brisk walk)?: 7 days    On average, how many minutes do you engage in exercise at this level?: 30 min  Stress: Not on file  Social Connections: Not on file  Intimate Partner Violence: Not At Risk (05/27/2023)   Humiliation, Afraid, Rape, and Kick questionnaire    Fear of Current or Ex-Partner: No    Emotionally Abused: No    Physically Abused: No    Sexually Abused: No    FAMILY HISTORY: Family History  Problem Relation Age of Onset   Thyroid  disease Mother    Prostate cancer Father 22  metastatic   Hypertension Father    Thyroid  disease Sister        twin sister age 93   Asthma Sister    Prostate cancer Brother 61   Heart attack Maternal Grandmother        died age 22's   Heart attack Maternal Grandfather        died age 53's   Clotting disorder Paternal Grandmother        cerebral hemorrage    Review of Systems  Constitutional:  Negative for appetite change, chills, fatigue, fever and unexpected weight change.  HENT:   Negative for hearing loss, lump/mass and trouble swallowing.   Eyes:  Negative for eye problems and icterus.  Respiratory:  Negative for chest tightness, cough and shortness of breath.   Cardiovascular:  Negative for chest pain, leg swelling and palpitations.  Gastrointestinal:  Negative for abdominal distention, abdominal pain, constipation, diarrhea, nausea and vomiting.  Endocrine: Negative for hot flashes.  Genitourinary:  Negative for difficulty urinating.   Musculoskeletal:  Negative for arthralgias.  Skin:  Negative for itching and rash.  Neurological:  Negative for dizziness, extremity weakness, headaches and numbness.  Hematological:  Negative for adenopathy. Does not  bruise/bleed easily.  Psychiatric/Behavioral:  Negative for depression. The patient is not nervous/anxious.       PHYSICAL EXAMINATION    Vitals:   07/18/24 1100  BP: 128/74  Pulse: 75  Resp: 16  Temp: 98.2 F (36.8 C)  SpO2: 98%    Physical Exam Constitutional:      General: She is not in acute distress.    Appearance: Normal appearance. She is not toxic-appearing.  HENT:     Head: Normocephalic and atraumatic.  Eyes:     General: No scleral icterus. Abdominal:     General: There is no distension.     Tenderness: There is no abdominal tenderness.  Musculoskeletal:        General: Swelling (right arm swelling present) present.     Cervical back: Neck supple.  Lymphadenopathy:     Cervical: No cervical adenopathy.  Skin:    General: Skin is warm and dry.     Findings: No rash.  Neurological:     General: No focal deficit present.     Mental Status: She is alert.  Psychiatric:        Mood and Affect: Mood normal.        Behavior: Behavior normal.     LABORATORY DATA:  CBC    Component Value Date/Time   WBC 5.5 05/31/2024 0934   WBC 11.1 (H) 10/13/2022 0930   RBC 4.07 05/31/2024 0934   HGB 12.7 05/31/2024 0934   HCT 39.0 05/31/2024 0934   PLT 279 05/31/2024 0934   MCV 95.8 05/31/2024 0934   MCH 31.2 05/31/2024 0934   MCHC 32.6 05/31/2024 0934   RDW 14.6 05/31/2024 0934   LYMPHSABS 0.8 05/31/2024 0934   MONOABS 0.5 05/31/2024 0934   EOSABS 0.5 05/31/2024 0934   BASOSABS 0.1 05/31/2024 0934    CMP     Component Value Date/Time   NA 139 05/31/2024 0934   K 4.4 05/31/2024 0934   CL 105 05/31/2024 0934   CO2 28 05/31/2024 0934   GLUCOSE 82 05/31/2024 0934   BUN 14 05/31/2024 0934   CREATININE 0.93 05/31/2024 0934   CALCIUM 9.1 05/31/2024 0934   PROT 6.8 05/31/2024 0934   ALBUMIN 4.2 05/31/2024 0934   AST 24 05/31/2024 0934   ALT 19 05/31/2024  0934   ALKPHOS 53 05/31/2024 0934   BILITOT 0.4 05/31/2024 0934   GFRNONAA >60 05/31/2024 0934      ASSESSMENT and THERAPY PLAN:   She met with Dr. Odean and myself and reviewed the following Plan:   Assessment and Plan Assessment & Plan Recurrent breast cancer with axillary lymph node involvement Recurrent breast cancer with axillary lymph node involvement, indicating progression with growth under the arm. - Order bone scan and CT scan to be completed in 09/2024 - Schedule follow-up appointment in December to discuss scan results and potential treatment options. - Consider palliative radiation to lymph node if symptomatic. - Discuss Enhertu treatment if HER2 negative and insurance allows.    Lymphedema of upper limb Lymphedema of the upper limb, likely exacerbated by axillary lymph node obstruction. - Monitor symptoms and manage discomfort.   All questions were answered. The patient knows to call the clinic with any problems, questions or concerns. We can certainly see the patient much sooner if necessary.  Total encounter time:20 minutes*in face-to-face visit time, chart review, lab review, care coordination, order entry, and documentation of the encounter time.    Morna Kendall, NP 07/20/24 5:09 AM Medical Oncology and Hematology [facility] [address] [place], [place] [address] Tel. [phone]    Fax. [phone]  *Total Encounter Time as defined by the Centers for Medicare and Medicaid Services includes, in addition to the face-to-face time of a patient visit (documented in the note above) non-face-to-face time: obtaining and reviewing outside history, ordering and reviewing medications, tests or procedures, care coordination (communications with other health care professionals or caregivers) and documentation in the medical record.

## 2024-07-23 ENCOUNTER — Ambulatory Visit: Admitting: Rehabilitation

## 2024-07-23 ENCOUNTER — Encounter: Payer: Self-pay | Admitting: Rehabilitation

## 2024-07-23 DIAGNOSIS — C50411 Malignant neoplasm of upper-outer quadrant of right female breast: Secondary | ICD-10-CM | POA: Diagnosis not present

## 2024-07-23 DIAGNOSIS — Z483 Aftercare following surgery for neoplasm: Secondary | ICD-10-CM

## 2024-07-23 DIAGNOSIS — R293 Abnormal posture: Secondary | ICD-10-CM

## 2024-07-23 DIAGNOSIS — M25611 Stiffness of right shoulder, not elsewhere classified: Secondary | ICD-10-CM

## 2024-07-23 NOTE — Therapy (Signed)
 OUTPATIENT PHYSICAL THERAPY  UPPER EXTREMITY ONCOLOGY TREATMENT  Patient Name: Caitlin [name] MRN: [id number] DOB:1955-10-22, 68 y.o., female Today's Date: 07/23/2024  END OF SESSION:  PT End of Session - 07/23/24 0803     Number of Visits 9    Date for Recertification  08/09/24    PT Start Time 0805    PT Stop Time 0851    PT Time Calculation (min) 46 min    Activity Tolerance Patient tolerated treatment well    Behavior During Therapy [facility] for tasks assessed/performed            Past Medical History:  Diagnosis Date   Asthma    Breast cancer (HCC)    right   Complication of anesthesia    Dyspnea    Dysrhythmia    2012   Headache    History of kidney stones    passed   Hypothyroidism    Pneumonia 01/26/2023   PONV (postoperative nausea and vomiting)    rt breast ca 08/2022   Right Breast   Scoliosis    Thyroid  disease    Past Surgical History:  Procedure Laterality Date   APPENDECTOMY     AXILLARY SENTINEL NODE BIOPSY Right 10/14/2022   Procedure: RIGHT AXILLARY SENTINEL NODE BIOPSY;  Surgeon: Ebbie Cough, MD;  Location: [facility] OR;  Service: General;  Laterality: Right;   BREAST BIOPSY Right 09/15/2022   US  RT BREAST BX W LOC DEV 1ST LESION IMG BX SPEC US  GUIDE 09/15/2022 [facility]   BREAST BIOPSY  10/12/2022   US  RT RADIOACTIVE SEED LOC 10/12/2022 [facility]   BREAST LUMPECTOMY WITH RADIOACTIVE SEED AND SENTINEL LYMPH NODE BIOPSY Right 10/14/2022   Procedure: RIGHT BREAST LUMPECTOMY WITH RADIOACTIVE SEED;  Surgeon: Ebbie Cough, MD;  Location: [facility] OR;  Service: General;  Laterality: Right;   COLONOSCOPY     LAPAROSCOPIC APPENDECTOMY     MASTECTOMY Right    PORT-A-CATH REMOVAL Left 05/03/2023   Procedure: REMOVAL PORT-A-CATH;  Surgeon: Ebbie Cough, MD;  Location: [facility] OR;  Service: General;  Laterality: Left;   PORT-A-CATH REMOVAL Left 12/22/2023   Procedure: REMOVAL PORT-A-CATH;  Surgeon: Ebbie Cough, MD;  Location:  [facility];  Service: General;  Laterality: Left;   PORTACATH PLACEMENT Left 10/14/2022   Procedure: INSERTION PORT-A-CATH;  Surgeon: Ebbie Cough, MD;  Location: [facility] OR;  Service: General;  Laterality: Left;   PORTACATH PLACEMENT Left 05/19/2023   Procedure: PORT PLACEMENT WITH ULTRASOUND GUIDANCE;  Surgeon: Ebbie Cough, MD;  Location: [facility];  Service: General;  Laterality: Left;   RE-EXCISION OF BREAST LUMPECTOMY Right 11/08/2022   Procedure: RE-EXCISION OF RIGHT BREAST LUMPECTOMY;  Surgeon: Ebbie Cough, MD;  Location: [facility];  Service: General;  Laterality: Right;   RE-EXCISION OF BREAST LUMPECTOMY Right 11/18/2022   Procedure: RE-EXCISION OF RIGHT BREAST LUMPECTOMY;  Surgeon: Ebbie Cough, MD;  Location: [facility] OR;  Service: General;  Laterality: Right;   SIMPLE MASTECTOMY WITH AXILLARY SENTINEL NODE BIOPSY Right 05/03/2023   Procedure: RIGHT MASTECTOMY;  Surgeon: Ebbie Cough, MD;  Location: [facility] OR;  Service: General;  Laterality: Right;  GEN & PEC BLOCK   WISDOM TOOTH EXTRACTION     Patient Active Problem List   Diagnosis Date Noted   S/P mastectomy, right 05/03/2023   Port-A-Cath in place 12/16/2022   Genetic testing 10/11/2022   Appendicitis with peritonitis 09/29/2022   Family history of prostate cancer 09/29/2022   Malignant neoplasm of upper-outer quadrant of right breast  in female, estrogen receptor positive (HCC) 09/22/2022   Atrial fibrillation with RVR (HCC) 11/30/2010   Chronic anticoagulation 11/30/2010   Hypothyroidism 11/30/2010   PALPITATIONS 11/27/2010    PCP: Jerel Sieving MD  REFERRING PROVIDER: Lynwood Nasuti, MD  REFERRING DIAG: [phone] (ICD-10-CM) - 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female, estrogen receptor positive (HCC)  THERAPY DIAG:  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female, estrogen receptor positive  (HCC)  Aftercare following surgery for neoplasm  Abnormal posture  Stiffness of right shoulder, not elsewhere classified  ONSET DATE: 05/03/23  Rationale for Evaluation and Treatment: Rehabilitation  SUBJECTIVE:                                                                                                                                                                                           SUBJECTIVE STATEMENT: Nothing new or different   PERTINENT HISTORY:   10/14/22:   Rt lumpectomy and SLNB with 5/5 nodes positive 11/08/22:   Re excision of Rt lumpectomy 11/18/22: Re excision of Rt lumpectomy  2/29/4: Chemotherapy ACP 05/03/23 Rt mastectomy with positive margins 5/5 nodes, 07/21/23: completed radiation 05/22/24: disease progression with new low left jugular and mediastinal adenopathy. No bone METS  PAIN:  PAIN:  Are you having pain? Yes: NPRS scale: 2/10 Pain location: Rt elbow and Rt axilla  Pain description: Achy at the elbow, tender at the axilla Aggravating factors: Gets worse as the day goes on Relieving factors: Tylenol , MLD    PRECAUTIONS: Other: at risk of lymphedema  RED FLAGS: None   WEIGHT BEARING RESTRICTIONS: No  FALLS:  Has patient fallen in last 6 months? No  LIVING ENVIRONMENT:/ Lives with: lives with their family Lives in: House/apartment Has following equipment at home: None  OCCUPATION: retired   LEISURE: walks from house to barn to feed her horses, had been doing a full body exercise program every day up until a few weeks ago - discontinued due to shoulder/axilla pain   HAND DOMINANCE: right   PRIOR LEVEL OF FUNCTION: Independent  PATIENT GOALS: to get more range of motion of R shoulder   OBJECTIVE:  COGNITION: Overall cognitive status: Within functional limits for tasks assessed   PALPATION: Several cords palpable in R axilla extending to scar and down upper arm  OBSERVATIONS / OTHER ASSESSMENTS: steri strips still in place  over healing mastectomy scar, cording visible in R axilla with abduction  POSTURE: rounded shoulders, forward head  UPPER EXTREMITY AROM/PROM:  A/PROM RIGHT   eval  RIGHT 07/12/24  Shoulder extension 77 23  Shoulder flexion 110 93  Shoulder abduction 116  87  Shoulder internal rotation 75   Shoulder external rotation 57     (Blank rows = not tested)  A/PROM LEFT   eval  Shoulder extension 67  Shoulder flexion 157  Shoulder abduction 158  Shoulder internal rotation 62  Shoulder external rotation 83    (Blank rows = not tested)   UPPER EXTREMITY STRENGTH:   LYMPHEDEMA ASSESSMENTS:  SURGERY TYPE/DATE: R mastectomy and SLNB on 10/14/22 followed by chemo and then radiation.  10/14/22: R breast lumpectomy 5/5, 11/08/22: Re excision of R lumpectomy, 11/18/22 - Re excision of R lumpectomy,completed neoadjuvant, 05/03/23 R mastectomy with positive margins 5/5 nodes, NUMBER OF LYMPH NODES REMOVED: see above CHEMOTHERAPY: completed neoadjuvant RADIATION:beginning 06/08/23  LYMPHEDEMA ASSESSMENTS:   LANDMARK RIGHT  eval RIGHT 07/12/24  At axilla  33 31.5  15 cm proximal to olecranon process 29.5 30.4  10 cm proximal to olecranon process 27 28.2  Olecranon process 23.5 23.9  15 cm proximal to ulnar styloid process 23.1 23.1  10 cm proximal to ulnar styloid process 20 19.8  Just proximal to ulnar styloid process 15.6 15.6  Across hand at thumb web space 20.9 19.3  At base of 2nd digit 6.5 6.5  (Blank rows = not tested)  Total volume at initial evaluation: 389.65 cm Total volume at 07/12/24 evaluation: 387.27 cm Volume change: ?2.38 cm Percentage change: ?0.61% From copilot   LANDMARK LEFT  eval LEFT  At axilla  32.5 31.5  15 cm proximal to olecranon process 29.1 28.4  10 cm proximal to olecranon process 27.5 27.6  Olecranon process 23.9 23.4  15 cm proximal to ulnar styloid process 22.7 22.9  10 cm proximal to ulnar styloid process 19.7 19.8  Just proximal to ulnar styloid  process 15.2 15.4  Across hand at thumb web space 19 19.8  At base of 2nd digit 6.2 6.3  (Blank rows = not tested)  Initial volume: 379.14 cm Volume on 07/12/24: 373.49 cm Volume change: ?5.64 cm Percentage change: ?1.49% From copilot   QUICK DASH SURVEY:   40.91   TODAY'S TREATMENT:                                                                                                                                          DATE:  07/23/24 Therapeutic Exercise Pulleys into flexion and scaption x each for improved reach on the Rt arm Wall ball flexion x 5  Manual Therapy MLD to Rt UE Short neck, 5 resisted diaphragmatic breaths, Lt axillary and pectoral nodes, anterior inter-axillary anastomosis, Rt inguinal nodes, Rt axillo-inguinals anastomosis, then Rt UE working from proximal to distal (lateral upper arm, medial to lateral and lateral again, anterior and posterior forearm, and dorsal wrist/hand and fingers) retracing all steps ending with LN's.  P/ROM to Rt shoulder into flex, abd and D2 with gentle scapular depression throughout,  STM to Rt pect insertion where  pt palpably tight  07/19/24 Manual Therapy MLD to Rt UE Short neck, 5 resisted diaphragmatic breaths, Lt axillary and pectoral nodes, anterior inter-axillary anastomosis, Rt inguinal nodes, Rt axillo-inguinals anastomosis, then Rt UE working from proximal to distal (lateral upper arm, medial to lateral and lateral again, anterior and posterior forearm, and dorsal wrist/hand and fingers) retracing all steps ending with LN's.  P/ROM to Rt shoulder into flex, abd and D2 with gentle scapular depression throughout,  STM to Rt pect insertion where pt palpably tight.   07/18/24: Manual Therapy MLD to Rt UE reviewing with pt while performing: Short neck, superficial and deep abdominals (but reviewed with pt she would do 5 resisted diaphragmatic breaths); Lt axillary and pectoral nodes, intact anterior thorax sequence,  anterior inter-axillary anastomosis, Rt inguinal nodes, Rt axillo-inguinals anastomosis, then Rt UE working from proximal to distal (lateral upper arm, medial to lateral and lateral again, anterior and posterior forearm, and dorsal wrist/hand and fingers) retracing all steps ending with LN's.  P/ROM to Rt shoulder into flex, abd and D2 with gentle scapular depression throughout, VC's to relax during stretching due to muscle guarding STM to Rt pect insertion where pt palpably tight.   07/12/24 - pt education & provided HEP    PATIENT EDUCATION:  Education details: POC, HEP, importance of wearing compression sleeve for at least 10-12 hours per day Person educated: Patient Education method: Explanation, Demonstration, and Handouts Education comprehension: verbalized understanding and returned demonstration  HOME EXERCISE PROGRAM: Access Code: [email or web address] URL: [email or web address] Date: 07/12/2024 Prepared by: Delon Pack  Exercises - Seated Scapular Retraction  - 1 x daily - 7 x weekly - 3 sets - 10 reps - Seated Shoulder Flexion Towel Slide at Table Top  - 1 x daily - 7 x weekly - 3 sets - 10 reps - Seated Shoulder Scaption Slide at Table Top with Forearm in Neutral  - 1 x daily - 7 x weekly - 3 sets - 10 reps - Supine Shoulder Flexion AAROM with Hands Clasped  - 1 x daily - 7 x weekly - 3 sets - 10 reps  ASSESSMENT:  CLINICAL IMPRESSION: Continued POC with good tolerance.  Still healing at the biopsy site with a steristrip present   OBJECTIVE IMPAIRMENTS: decreased ROM, decreased strength, hypomobility, increased fascial restrictions, impaired UE functional use, postural dysfunction, and pain.   ACTIVITY LIMITATIONS: carrying, lifting, bathing, reach over head, and hygiene/grooming  PARTICIPATION LIMITATIONS: meal prep, cleaning, laundry, yard work, and difficulty with feeding and giving her horses water  PERSONAL FACTORS: 1 comorbidity: previous history of R  lumpectomy are also affecting patient's functional outcome.   REHAB POTENTIAL: Good  CLINICAL DECISION MAKING: Stable/uncomplicated  EVALUATION COMPLEXITY: Low  GOALS: Goals reviewed with patient? Yes  SHORT TERM GOALS=LONG TERM GOALS Target date: 08/09/24  Pt will improve shoulder AROM in all directions for improved functional mobility to at least baseline  Baseline:  Goal status: INITIAL  2.  Pt will demonstrate an improvement in Quick-DASH score from 40.91 to <10 in order to return to baseline function  Baseline: 40.91  Goal status: INITIAL  3.  Pt will compliant with wearing her compression sleeve to decrease swelling and return SOZO score back to baseline  Baseline:  Goal status: INITIAL  4.  Pt will be independent in a home exercise program for continued strengthening and stretching.  Baseline:  Goal status: INITIAL  PLAN:  PT FREQUENCY: 2x/week  PT DURATION: 4 weeks  PLANNED INTERVENTIONS: Therapeutic exercises, Therapeutic activity, Patient/Family education,  Self Care, Orthotic/Fit training, Manual lymph drainage, scar mobilization, Taping, Vasopneumatic device, and Manual therapy  PLAN FOR NEXT SESSION: Cont and review Rt UE MLD, STM to Rt upper quadrant PRN, (avoid Rt lateral trunk with active tumor) PROM to R shoulder, pulleys, ball   Berwyn Knights, PTA 07/23/24 8:52 AM

## 2024-07-24 ENCOUNTER — Other Ambulatory Visit: Payer: Self-pay | Admitting: *Deleted

## 2024-07-24 DIAGNOSIS — Z5181 Encounter for therapeutic drug level monitoring: Secondary | ICD-10-CM

## 2024-07-24 NOTE — Progress Notes (Signed)
 Per MD request, orders for echo placed prior to enhertu.

## 2024-07-25 ENCOUNTER — Encounter: Payer: Self-pay | Admitting: Rehabilitation

## 2024-07-25 ENCOUNTER — Other Ambulatory Visit: Payer: Self-pay | Admitting: Hematology and Oncology

## 2024-07-25 ENCOUNTER — Ambulatory Visit: Admitting: Rehabilitation

## 2024-07-25 DIAGNOSIS — Z483 Aftercare following surgery for neoplasm: Secondary | ICD-10-CM

## 2024-07-25 DIAGNOSIS — R293 Abnormal posture: Secondary | ICD-10-CM

## 2024-07-25 DIAGNOSIS — C50411 Malignant neoplasm of upper-outer quadrant of right female breast: Secondary | ICD-10-CM

## 2024-07-25 DIAGNOSIS — M25611 Stiffness of right shoulder, not elsewhere classified: Secondary | ICD-10-CM

## 2024-07-25 MED ORDER — DEXAMETHASONE 4 MG PO TABS: ORAL_TABLET | ORAL | 1 refills | Status: DC

## 2024-07-25 MED ORDER — ONDANSETRON HCL 8 MG PO TABS: 8.0000 mg | ORAL_TABLET | Freq: Three times a day (TID) | ORAL | 1 refills | Status: DC | PRN

## 2024-07-25 MED ORDER — PROCHLORPERAZINE MALEATE 10 MG PO TABS: 10.0000 mg | ORAL_TABLET | Freq: Four times a day (QID) | ORAL | 1 refills | Status: DC | PRN

## 2024-07-25 MED ORDER — LIDOCAINE-PRILOCAINE 2.5-2.5 % EX CREA: TOPICAL_CREAM | CUTANEOUS | 3 refills | Status: DC

## 2024-07-25 NOTE — Therapy (Signed)
 OUTPATIENT PHYSICAL THERAPY  UPPER EXTREMITY ONCOLOGY TREATMENT  Patient Name: Caitlin [name] MRN: [id number] DOB:[birth year], 68 y.o., female Today's Date: 07/25/2024  END OF SESSION:  PT End of Session - 07/25/24 1206     Visit Number 5    Number of Visits 9    Date for Recertification  08/09/24    PT Start Time 1110    PT Stop Time 1150    PT Time Calculation (min) 40 min    Activity Tolerance Patient tolerated treatment well;Treatment limited secondary to medical complications (Comment)    Behavior During Therapy [facility] for tasks assessed/performed             Past Medical History:  Diagnosis Date   Asthma    Breast cancer (HCC)    right   Complication of anesthesia    Dyspnea    Dysrhythmia    2012   Headache    History of kidney stones    passed   Hypothyroidism    Pneumonia 01/26/2023   PONV (postoperative nausea and vomiting)    rt breast ca 08/2022   Right Breast   Scoliosis    Thyroid  disease    Past Surgical History:  Procedure Laterality Date   APPENDECTOMY     AXILLARY SENTINEL NODE BIOPSY Right 10/14/2022   Procedure: RIGHT AXILLARY SENTINEL NODE BIOPSY;  Surgeon: Ebbie Cough, MD;  Location: [facility] OR;  Service: General;  Laterality: Right;   BREAST BIOPSY Right 09/15/2022   US  RT BREAST BX W LOC DEV 1ST LESION IMG BX SPEC US  GUIDE 09/15/2022 [facility]   BREAST BIOPSY  10/12/2022   US  RT RADIOACTIVE SEED LOC 10/12/2022 [facility]AMMOGRAP[facility]   BREAST LUMPECTOMY WITH RADIOACTIVE SEED AND SENTINEL LYMPH NODE BIOPSY Right 10/14/2022   Procedure: RIGHT BREAST LUMPECTOMY WITH RADIOACTIVE SEED;  Surgeon: Ebbie Cough, MD;  Location: [facility] OR;  Service: General;  Laterality: Right;   COLONOSCOPY     LAPAROSCOPIC APPENDECTOMY     MASTECTOMY Right    PORT-A-CATH REMOVAL Left 05/03/2023   Procedure: REMOVAL PORT-A-CATH;  Surgeon: Ebbie Cough, MD;  Location: [facility] OR;  Service: General;  Laterality: Left;   PORT-A-CATH REMOVAL Left  12/22/2023   Procedure: REMOVAL PORT-A-CATH;  Surgeon: Ebbie Cough, MD;  Location: [facility];  Service: General;  Laterality: Left;   PORTACATH PLACEMENT Left 10/14/2022   Procedure: INSERTION PORT-A-CATH;  Surgeon: Ebbie Cough, MD;  Location: [facility] OR;  Service: General;  Laterality: Left;   PORTACATH PLACEMENT Left 05/19/2023   Procedure: PORT PLACEMENT WITH ULTRASOUND GUIDANCE;  Surgeon: Ebbie Cough, MD;  Location: [facility];  Service: General;  Laterality: Left;   RE-EXCISION OF BREAST LUMPECTOMY Right 11/08/2022   Procedure: RE-EXCISION OF RIGHT BREAST LUMPECTOMY;  Surgeon: Ebbie Cough, MD;  Location:  SURGERY CENTER;  Service: General;  Laterality: Right;   RE-EXCISION OF BREAST LUMPECTOMY Right 11/18/2022   Procedure: RE-EXCISION OF RIGHT BREAST LUMPECTOMY;  Surgeon: Ebbie Cough, MD;  Location: [facility] OR;  Service: General;  Laterality: Right;   SIMPLE MASTECTOMY WITH AXILLARY SENTINEL NODE BIOPSY Right 05/03/2023   Procedure: RIGHT MASTECTOMY;  Surgeon: Ebbie Cough, MD;  Location: [facility] OR;  Service: General;  Laterality: Right;  GEN & PEC BLOCK   WISDOM TOOTH EXTRACTION     Patient Active Problem List   Diagnosis Date Noted   S/P mastectomy, right 05/03/2023   Port-A-Cath in place 12/16/2022   Genetic testing 10/11/2022   Appendicitis with peritonitis 09/29/2022   Family history of  prostate cancer 09/29/2022   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female, estrogen receptor positive (HCC) 09/22/2022   Atrial fibrillation with RVR (HCC) 11/30/2010   Chronic anticoagulation 11/30/2010   Hypothyroidism 11/30/2010   PALPITATIONS 11/27/2010    PCP: Jerel Sieving MD  REFERRING PROVIDER: Lynwood Nasuti, MD  REFERRING DIAG: [phone] (ICD-10-CM) - 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female, estrogen receptor positive (HCC)  THERAPY DIAG:  Malignant neoplasm  of upper-outer quadrant of right breast in female, estrogen receptor positive (HCC)  Aftercare following surgery for neoplasm  Abnormal posture  Stiffness of right shoulder, not elsewhere classified  ONSET DATE: 05/03/23  Rationale for Evaluation and Treatment: Rehabilitation  SUBJECTIVE:                                                                                                                                                                                           SUBJECTIVE STATEMENT: I will be starting the enhertu 08/09/24.  I also have to do the echo.    PERTINENT HISTORY:   10/14/22:   Rt lumpectomy and SLNB with 5/5 nodes positive 11/08/22:   Re excision of Rt lumpectomy 11/18/22: Re excision of Rt lumpectomy  2/29/4: Chemotherapy ACP 05/03/23 Rt mastectomy with positive margins 5/5 nodes, 07/21/23: completed radiation 05/22/24: disease progression with new low left jugular and mediastinal adenopathy. No bone METS  PAIN:  PAIN:  Are you having pain? Yes: NPRS scale: 2/10 Pain location: Rt elbow and Rt axilla  Pain description: Achy at the elbow, tender at the axilla Aggravating factors: Gets worse as the day goes on Relieving factors: Tylenol , MLD    PRECAUTIONS: Other: at risk of lymphedema  RED FLAGS: None   WEIGHT BEARING RESTRICTIONS: No  FALLS:  Has patient fallen in last 6 months? No  LIVING ENVIRONMENT:/ Lives with: lives with their family Lives in: House/apartment Has following equipment at home: None  OCCUPATION: retired   LEISURE: walks from house to barn to feed her horses, had been doing a full body exercise program every day up until a few weeks ago - discontinued due to shoulder/axilla pain   HAND DOMINANCE: right   PRIOR LEVEL OF FUNCTION: Independent  PATIENT GOALS: to get more range of motion of R shoulder   OBJECTIVE:  COGNITION: Overall cognitive status: Within functional limits for tasks assessed   PALPATION: Several cords  palpable in R axilla extending to scar and down upper arm  OBSERVATIONS / OTHER ASSESSMENTS: steri strips still in place over healing mastectomy scar, cording visible in R axilla with abduction  POSTURE: rounded shoulders, forward head  UPPER  EXTREMITY AROM/PROM:  A/PROM RIGHT   eval  RIGHT 07/12/24  Shoulder extension 77 23  Shoulder flexion 110 93  Shoulder abduction 116 87  Shoulder internal rotation 75   Shoulder external rotation 57     (Blank rows = not tested)  A/PROM LEFT   eval  Shoulder extension 67  Shoulder flexion 157  Shoulder abduction 158  Shoulder internal rotation 62  Shoulder external rotation 83    (Blank rows = not tested)   UPPER EXTREMITY STRENGTH:   LYMPHEDEMA ASSESSMENTS:  SURGERY TYPE/DATE: R mastectomy and SLNB on 10/14/22 followed by chemo and then radiation.  10/14/22: R breast lumpectomy 5/5, 11/08/22: Re excision of R lumpectomy, 11/18/22 - Re excision of R lumpectomy,completed neoadjuvant, 05/03/23 R mastectomy with positive margins 5/5 nodes, NUMBER OF LYMPH NODES REMOVED: see above CHEMOTHERAPY: completed neoadjuvant RADIATION:beginning 06/08/23  LYMPHEDEMA ASSESSMENTS:   LANDMARK RIGHT  eval RIGHT 07/12/24  At axilla  33 31.5  15 cm proximal to olecranon process 29.5 30.4  10 cm proximal to olecranon process 27 28.2  Olecranon process 23.5 23.9  15 cm proximal to ulnar styloid process 23.1 23.1  10 cm proximal to ulnar styloid process 20 19.8  Just proximal to ulnar styloid process 15.6 15.6  Across hand at thumb web space 20.9 19.3  At base of 2nd digit 6.5 6.5  (Blank rows = not tested)  Total volume at initial evaluation: 389.65 cm Total volume at 07/12/24 evaluation: 387.27 cm Volume change: ?2.38 cm Percentage change: ?0.61% From copilot   LANDMARK LEFT  eval LEFT  At axilla  32.5 31.5  15 cm proximal to olecranon process 29.1 28.4  10 cm proximal to olecranon process 27.5 27.6  Olecranon process 23.9 23.4  15 cm  proximal to ulnar styloid process 22.7 22.9  10 cm proximal to ulnar styloid process 19.7 19.8  Just proximal to ulnar styloid process 15.2 15.4  Across hand at thumb web space 19 19.8  At base of 2nd digit 6.2 6.3  (Blank rows = not tested)  Initial volume: 379.14 cm Volume on 07/12/24: 373.49 cm Volume change: ?5.64 cm Percentage change: ?1.49% From copilot   QUICK DASH SURVEY:   40.91   TODAY'S TREATMENT:                                                                                                                                          DATE:  07/25/24 Examined redness that was visible through the arm whole of pt's shirt near the biopsy site.   It appears to be purple/red at the lateral trunk and chest and towards the sternum at the tumor site which appears to have some progression which pt is also aware of and reports she thinks it is just from the tumor.  Discussed POC: pt is ind with self MLD and compression and we will switch to maintaining Rom and  functional movement and treat pain and swelling as needed.   Pulleys into flexion and scaption x 2-3 min each with rest break between Wall ball flexion x 5 Supine dowel chest press x 10 Supine dowel flexion x 5 Palms up extension presses 5 x 10 with cueing to include the shoulder Assessed breathing which incorporated diaphragmatic breathing well without cueing PROM into flexion, abduction, and ER to tolerance  MLD of the arm only  07/23/24 Therapeutic Exercise Pulleys into flexion and scaption x each for improved reach on the Rt arm Wall ball flexion x 5  Manual Therapy MLD to Rt UE Short neck, 5 resisted diaphragmatic breaths, Lt axillary and pectoral nodes, anterior inter-axillary anastomosis, Rt inguinal nodes, Rt axillo-inguinals anastomosis, then Rt UE working from proximal to distal (lateral upper arm, medial to lateral and lateral again, anterior and posterior forearm, and dorsal wrist/hand and fingers)  retracing all steps ending with LN's.  P/ROM to Rt shoulder into flex, abd and D2 with gentle scapular depression throughout,  STM to Rt pect insertion where pt palpably tight  07/19/24 Manual Therapy MLD to Rt UE Short neck, 5 resisted diaphragmatic breaths, Lt axillary and pectoral nodes, anterior inter-axillary anastomosis, Rt inguinal nodes, Rt axillo-inguinals anastomosis, then Rt UE working from proximal to distal (lateral upper arm, medial to lateral and lateral again, anterior and posterior forearm, and dorsal wrist/hand and fingers) retracing all steps ending with LN's.  P/ROM to Rt shoulder into flex, abd and D2 with gentle scapular depression throughout,  STM to Rt pect insertion where pt palpably tight.   PATIENT EDUCATION:  Education details: POC, HEP, importance of wearing compression sleeve for at least 10-12 hours per day Person educated: Patient Education method: Explanation, Demonstration, and Handouts Education comprehension: verbalized understanding and returned demonstration  HOME EXERCISE PROGRAM: Access Code: [email or web address] URL: [email or web address] Date: 07/12/2024 Prepared by: Delon Pack  Exercises - Seated Scapular Retraction  - 1 x daily - 7 x weekly - 3 sets - 10 reps - Seated Shoulder Flexion Towel Slide at Table Top  - 1 x daily - 7 x weekly - 3 sets - 10 reps - Seated Shoulder Scaption Slide at Table Top with Forearm in Neutral  - 1 x daily - 7 x weekly - 3 sets - 10 reps - Supine Shoulder Flexion AAROM with Hands Clasped  - 1 x daily - 7 x weekly - 3 sets - 10 reps  ASSESSMENT:  CLINICAL IMPRESSION: Modified the POC a bit today due to worsening of redness at the tumor site to focus more on maintenance of functional movement and pain relief.   OBJECTIVE IMPAIRMENTS: decreased ROM, decreased strength, hypomobility, increased fascial restrictions, impaired UE functional use, postural dysfunction, and pain.   ACTIVITY LIMITATIONS: carrying,  lifting, bathing, reach over head, and hygiene/grooming  PARTICIPATION LIMITATIONS: meal prep, cleaning, laundry, yard work, and difficulty with feeding and giving her horses water  PERSONAL FACTORS: 1 comorbidity: previous history of R lumpectomy are also affecting patient's functional outcome.   REHAB POTENTIAL: Good  CLINICAL DECISION MAKING: Stable/uncomplicated  EVALUATION COMPLEXITY: Low  GOALS: Goals reviewed with patient? Yes  SHORT TERM GOALS=LONG TERM GOALS Target date: 08/09/24  Pt will improve shoulder AROM in all directions for improved functional mobility to at least baseline  Baseline:  Goal status: INITIAL  2.  Pt will demonstrate an improvement in Quick-DASH score from 40.91 to <10 in order to return to baseline function  Baseline: 40.91  Goal status: INITIAL  3.  Pt will compliant with wearing her compression sleeve to decrease swelling and return SOZO score back to baseline  Baseline:  Goal status: INITIAL  4.  Pt will be independent in a home exercise program for continued strengthening and stretching.  Baseline:  Goal status: INITIAL  PLAN:  PT FREQUENCY: 2x/week  PT DURATION: 4 weeks  PLANNED INTERVENTIONS: Therapeutic exercises, Therapeutic activity, Patient/Family education, Self Care, Orthotic/Fit training, Manual lymph drainage, scar mobilization, Taping, Vasopneumatic device, and Manual therapy  PLAN FOR NEXT SESSION: Cont and review Rt UE MLD, STM to Rt upper quadrant PRN, (avoid Rt lateral trunk with active tumor) PROM to R shoulder, pulleys, ball   Berwyn Knights, PTA 07/25/24 12:06 PM

## 2024-07-25 NOTE — Progress Notes (Signed)
 DISCONTINUE OFF PATHWAY REGIMEN - Breast   [id number]:$[id number]$$[id number]$  200 mg IV D1 q21 Days:   A cycle is every 21 days:     Pembrolizumab    **Always confirm dose/schedule in your pharmacy ordering system**  PRIOR TREATMENT: Off Pathway: Pembrolizumab  200 mg IV D1 q21 Days  START OFF PATHWAY REGIMEN - Breast   [id number]:Fam-trastuzumab deruxtecan-nxki 5.4 mg/kg IV D1 q21 Days:   A cycle is every 21 days:     Fam-trastuzumab deruxtecan-nxki   **Always confirm dose/schedule in your pharmacy ordering system**  Patient Characteristics: Distant Metastases or Locoregional Recurrent Disease - Unresectable, M0 or Locally Advanced Unresectable Disease Progressing after Neoadjuvant and Local Therapies, M0, HER2 Negative/Ultralow/Low, ER Negative, Chemotherapy, HER2 Negative/Ultralow, Third  Line and Beyond, [employer] or Not a Candidate for Molecular Targeted Therapy Therapeutic Status: Distant Metastases HER2 Status: Ultralow ER Status: Negative (-) PR Status: Negative (-) Therapy Approach Indicated: Standard Chemotherapy/Endocrine Therapy Line of Therapy: Third Line and Beyond Intent of Therapy: Non-Curative / Palliative Intent, Not Discussed with Patient

## 2024-07-26 ENCOUNTER — Other Ambulatory Visit: Payer: Self-pay

## 2024-07-26 ENCOUNTER — Encounter: Payer: Self-pay | Admitting: Hematology and Oncology

## 2024-07-27 ENCOUNTER — Ambulatory Visit (HOSPITAL_COMMUNITY)
Admission: RE | Admit: 2024-07-27 | Discharge: 2024-07-27 | Disposition: A | Discharge status: Home / Self Care | Source: Ambulatory Visit | Attending: Hematology and Oncology | Admitting: Hematology and Oncology

## 2024-07-27 ENCOUNTER — Other Ambulatory Visit: Payer: Self-pay

## 2024-07-27 DIAGNOSIS — Z79899 Other long term (current) drug therapy: Secondary | ICD-10-CM | POA: Insufficient documentation

## 2024-07-27 DIAGNOSIS — Z5181 Encounter for therapeutic drug level monitoring: Secondary | ICD-10-CM | POA: Insufficient documentation

## 2024-07-27 DIAGNOSIS — Z08 Encounter for follow-up examination after completed treatment for malignant neoplasm: Secondary | ICD-10-CM | POA: Diagnosis present

## 2024-07-27 LAB — ECHOCARDIOGRAM COMPLETE
Area-P 1/2: 3.99 cm2
Calc EF: 60.5 %
S' Lateral: 2.1 cm
Single Plane A2C EF: 57.3 %
Single Plane A4C EF: 62.2 %

## 2024-07-27 NOTE — Progress Notes (Signed)
  Echocardiogram 2D Echocardiogram has been performed.  Norleen ORN Wilmarie Sparlin 07/27/2024, 11:40 AM

## 2024-07-30 ENCOUNTER — Ambulatory Visit (HOSPITAL_COMMUNITY)
Admission: RE | Admit: 2024-07-30 | Discharge: 2024-07-30 | Disposition: A | Discharge status: Home / Self Care | Source: Ambulatory Visit | Attending: Adult Health | Admitting: Adult Health

## 2024-07-30 ENCOUNTER — Encounter: Payer: Self-pay | Admitting: Hematology and Oncology

## 2024-07-30 ENCOUNTER — Ambulatory Visit: Admitting: Rehabilitation

## 2024-07-30 ENCOUNTER — Ambulatory Visit (HOSPITAL_COMMUNITY)
Admission: RE | Admit: 2024-07-30 | Discharge: 2024-07-30 | Disposition: A | Source: Ambulatory Visit | Attending: Adult Health | Admitting: Adult Health

## 2024-07-30 ENCOUNTER — Other Ambulatory Visit: Payer: Self-pay | Admitting: Hematology and Oncology

## 2024-07-30 DIAGNOSIS — C50411 Malignant neoplasm of upper-outer quadrant of right female breast: Secondary | ICD-10-CM | POA: Diagnosis not present

## 2024-07-30 DIAGNOSIS — Z170 Estrogen receptor positive status [ER+]: Secondary | ICD-10-CM | POA: Insufficient documentation

## 2024-07-30 DIAGNOSIS — I700 Atherosclerosis of aorta: Secondary | ICD-10-CM | POA: Diagnosis not present

## 2024-07-30 DIAGNOSIS — J90 Pleural effusion, not elsewhere classified: Secondary | ICD-10-CM | POA: Diagnosis not present

## 2024-07-30 DIAGNOSIS — C799 Secondary malignant neoplasm of unspecified site: Secondary | ICD-10-CM | POA: Diagnosis not present

## 2024-07-30 DIAGNOSIS — C50911 Malignant neoplasm of unspecified site of right female breast: Secondary | ICD-10-CM | POA: Diagnosis not present

## 2024-07-30 DIAGNOSIS — I2510 Atherosclerotic heart disease of native coronary artery without angina pectoris: Secondary | ICD-10-CM | POA: Diagnosis not present

## 2024-07-30 DIAGNOSIS — C7951 Secondary malignant neoplasm of bone: Secondary | ICD-10-CM | POA: Diagnosis not present

## 2024-07-30 MED ORDER — IOHEXOL 300 MG/ML  SOLN
100.0000 mL | Freq: Once | INTRAMUSCULAR | Status: AC | PRN
  Administered 2024-07-30: 100 mL via INTRAVENOUS

## 2024-07-30 MED ORDER — SODIUM CHLORIDE (PF) 0.9 % IJ SOLN
INTRAMUSCULAR | Status: AC
  Filled 2024-07-30: qty 50

## 2024-07-30 MED ORDER — TECHNETIUM TC 99M MEDRONATE IV KIT
20.0000 | PACK | Freq: Once | INTRAVENOUS | Status: AC | PRN
Start: 2024-07-30 — End: 2024-07-30
  Administered 2024-07-30: 19.2 via INTRAVENOUS

## 2024-07-30 MED ORDER — SODIUM CHLORIDE (PF) 0.9 % IJ SOLN
INTRAMUSCULAR | Status: AC
  Filled 2024-07-30: qty 100

## 2024-07-30 MED ORDER — OXYCODONE HCL 5 MG PO TABS: 5.0000 mg | ORAL_TABLET | Freq: Four times a day (QID) | ORAL | 0 refills | Status: DC | PRN

## 2024-07-30 NOTE — Progress Notes (Signed)
 Patient is experiencing worsening pain. I sent a prescription for oxycodone 

## 2024-08-01 ENCOUNTER — Inpatient Hospital Stay (HOSPITAL_BASED_OUTPATIENT_CLINIC_OR_DEPARTMENT_OTHER): Admitting: Hematology and Oncology

## 2024-08-01 ENCOUNTER — Encounter: Admitting: Rehabilitation

## 2024-08-01 ENCOUNTER — Inpatient Hospital Stay: Admitting: Physician Assistant

## 2024-08-01 ENCOUNTER — Ambulatory Visit (HOSPITAL_COMMUNITY)
Admission: RE | Admit: 2024-08-01 | Discharge: 2024-08-01 | Disposition: A | Discharge status: Home / Self Care | Source: Ambulatory Visit | Attending: Hematology and Oncology | Admitting: Hematology and Oncology

## 2024-08-01 ENCOUNTER — Other Ambulatory Visit: Payer: Self-pay | Admitting: *Deleted

## 2024-08-01 ENCOUNTER — Ambulatory Visit (HOSPITAL_COMMUNITY)
Admission: RE | Admit: 2024-08-01 | Discharge: 2024-08-01 | Disposition: A | Source: Ambulatory Visit | Attending: Radiology | Admitting: Radiology

## 2024-08-01 ENCOUNTER — Encounter: Payer: Self-pay | Admitting: Hematology and Oncology

## 2024-08-01 DIAGNOSIS — Z170 Estrogen receptor positive status [ER+]: Secondary | ICD-10-CM | POA: Diagnosis not present

## 2024-08-01 DIAGNOSIS — J90 Pleural effusion, not elsewhere classified: Secondary | ICD-10-CM | POA: Insufficient documentation

## 2024-08-01 DIAGNOSIS — R918 Other nonspecific abnormal finding of lung field: Secondary | ICD-10-CM | POA: Diagnosis not present

## 2024-08-01 DIAGNOSIS — J948 Other specified pleural conditions: Secondary | ICD-10-CM | POA: Insufficient documentation

## 2024-08-01 DIAGNOSIS — C50411 Malignant neoplasm of upper-outer quadrant of right female breast: Secondary | ICD-10-CM | POA: Diagnosis not present

## 2024-08-01 DIAGNOSIS — C773 Secondary and unspecified malignant neoplasm of axilla and upper limb lymph nodes: Secondary | ICD-10-CM | POA: Diagnosis not present

## 2024-08-01 DIAGNOSIS — Z48813 Encounter for surgical aftercare following surgery on the respiratory system: Secondary | ICD-10-CM | POA: Diagnosis not present

## 2024-08-01 MED ORDER — LIDOCAINE-EPINEPHRINE (PF) 2 %-1:200000 IJ SOLN
INTRAMUSCULAR | Status: AC
  Filled 2024-08-01: qty 20

## 2024-08-01 NOTE — Assessment & Plan Note (Addendum)
 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  No distant metastatic disease. Bone scan 10/29/2022: No evidence of bone metastasis   11/08/2022 and 11/18/2022: Margin reexcision surgeries: Final margin focal positivity.  After much discussion the plan is to finish her systemic treatment and come back at a later point to do mastectomy.   Treatment summary: Adjuvant chemotherapy with dose dense Adriamycin  and Cytoxan  followed by Taxol  to be completed 05/02/2023, Keytruda  05/25/2023-08/17/2023 (discontinued due to immune mediated adverse effects: Skin rash) Mastectomy by Dr. Ebbie 05/03/2023:Right mastectomy: 8 cm grade 3 IDC with extensive lymphatic and vascular invasion RCB class III, deep margin multifocally positive, 5/5 lymph nodes positive, ER weak 10%, PR 0%, HER2 0, Ki-67 30%, total lymph nodes: 10/10 (prior lumpectomy she had 5 positive nodes) Adjuvant radiation to start 06/09/2023-07/21/23 Capecitabine  started 05/25/2023-11/25/2023 ------------------------------------------------------------------------------------------------------------------------------------------------ Guardant reveal 12/14/2023: CT DNA detected 2.7% 02/16/2024: CT CAP: Interval enlargement of left superior mediastinal lymph node 2 x 1.8 cm concerning for metastatic disease 02/26/2024: PET/CT: Few hypermetabolic lymph nodes identified consistent with metastatic disease including left upper  mediastinal, prevascular, left internal mammary and right axillary region CT CAP 05/22/2024: New left internal jugular lymph node 1.4 cm.  Enlarging mediastinal lymph nodes 1.2 cm (was 0.4 cm)   Right axillary lymph node biopsy: Positive for metastatic ductal carcinoma ER 0%, PR 0%, HER2 0, Ki-67 50% Guardant 360 12/26/2023: T p53 present, TMB 5.69, MSI high not detected  07/30/2024: CT CAP: New irregular soft tissue mass anterior mediastinum, increased in size and number of lower cervical, mediastinal, left hilar and left axillary nodes.  Large infiltrating mass right latissimus dorsi muscle which is up to intercostal muscles, right upper lobe lymphangitic carcinomatosis changes, no bone metastasis  Shortness of breath: After reviewing the CT scans it appears that she has moderate pleural effusion.  We would like to see if that can be drained so that she can tolerate the upcoming trip to [place]  and [place].  Follow-up in 1 week to start Enhertu

## 2024-08-01 NOTE — Progress Notes (Deleted)
 Symptom Management Consult Note [facility]    Patient Care Team: Toribio Jerel MATSU, MD as PCP - General (Unknown Physician Specialty) Tyree Nanetta SAILOR, RN as Oncology Nurse Navigator Odean Potts, MD as Consulting Physician (Hematology and Oncology) Shannon Agent, MD as Consulting Physician (Radiation Oncology) Ebbie Cough, MD as Consulting Physician (General Surgery)    Name / MRN / DOB: Caitlin [name]  [id number]  08-11-1956   Date of visit: 08/01/2024   Chief Complaint/Reason for visit: shortness of breath   Current Therapy:   Last treatment:  Day ***   Cycle *** on    ASSESSMENT AND PLAN Patient is a 68 y.o. female with oncologic history of malignant neoplasm of upper-outer quadrant of right breast in female, estrogen receptor positivefollowed by Dr. Odean.  I have viewed most recent oncology note and lab work.  #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female, estrogen receptor positive  - Next appointment with oncologist is 08/29/24   #     Strict ED precautions discussed should symptoms worsen.   HEME/ONC HISTORY Oncology History  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female, estrogen receptor positive (HCC)  09/15/2022 Initial Diagnosis   Screening mammogram detected right breast mass which measured 1.4 cm by ultrasound at 10 o'clock position, biopsy revealed grade 3 IDC with lymphovascular invasion ER 10%, PR 0%, Ki-67 30%, HER2 0 negative   09/25/2022 Cancer Staging   Staging form: Breast, AJCC 8th Edition - Clinical: Stage IB (cT1c, cN0, cM0, G3, ER+, PR-, HER2-) - Signed by Odean Potts, MD on 09/25/2022 Histologic grading system: 3 grade system    Genetic Testing   [facility] CancerNext-Expanded Panel+RNA was Negative. Report date is 10/06/2021.  The CancerNext-Expanded gene panel offered by [facility] and includes sequencing, rearrangement, and RNA analysis for the following 77  genes: AIP, ALK, APC, ATM, AXIN2, BAP1, BARD1, BLM, BMPR1A, BRCA1, BRCA2, BRIP1, CDC73, CDH1, CDK4, CDKN1B, CDKN2A, CHEK2, CTNNA1, DICER1, FANCC, FH, FLCN, GALNT12, KIF1B, LZTR1, MAX, MEN1, MET, MLH1, MSH2, MSH3, MSH6, MUTYH, NBN, NF1, NF2, NTHL1, PALB2, PHOX2B, PMS2, POT1, PRKAR1A, PTCH1, PTEN, RAD51C, RAD51D, RB1, RECQL, RET, SDHA, SDHAF2, SDHB, SDHC, SDHD, SMAD4, SMARCA4, SMARCB1, SMARCE1, STK11, SUFU, TMEM127, TP53, TSC1, TSC2, VHL and XRCC2 (sequencing and deletion/duplication); EGFR, EGLN1, HOXB13, KIT, MITF, PDGFRA, POLD1, and POLE (sequencing only); EPCAM and GREM1 (deletion/duplication only).    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 with high-grade DCIS, inferior margin positive, angiolymphatic invasion present, 5/5 lymph nodes positive, additional medial margin: Poorly differentiated carcinoma with lymphoid stroma, additional inferior margin: Positive, ER 10% weak, PR 0%, HER2 0, Ki-67 30%   10/21/2022 Cancer Staging   Staging form: Breast, AJCC 8th Edition - Pathologic: Stage IIIC (pT1c, pN2a, cM0, G3, ER-, PR-, HER2-) - Signed by Odean Potts, MD on 10/21/2022 Stage prefix: Initial diagnosis Histologic grading system: 3 grade system   11/08/2022 Surgery   Margin reexcision: Inferior margin: Grade 3 IDC, new margin positive Medial margin: Grade 3 IDC new margin widely involved   11/18/2022 Surgery   Right inferior margin reexcision: Invasive poorly differentiated adenocarcinoma grade 3 present in new inferior margin.  Medial margin reexcision: Invasive poorly differentiated adenocarcinoma grade 3 present at new medial margin, posterior margin: Poorly differentiated adenocarcinoma grade 3, margin negative  11/08/2022 and 11/18/2022: Final margin focal positivity. After much discussion the plan is to finish her systemic treatment and come back at  a later point to do mastectomy.    12/02/2022 -  04/18/2023 Chemotherapy   Patient is on Treatment Plan : BREAST ADJUVANT DOSE DENSE AC q14d / PACLitaxel  q7d     05/03/2023 Surgery   Right mastectomy: 8 cm grade 3 IDC with extensive lymphatic and vascular invasion RCB class III, deep margin multifocally positive, 5/5 lymph nodes positive, ER weak 10%, PR 0%, HER2 0, Ki-67 30%   05/25/2023 - 08/17/2023 Chemotherapy   Patient is on Treatment Plan : BREAST Pembrolizumab  (200) q21d x 24 months     05/25/2023 -  Adjuvant Chemotherapy   Capectiabine 1000mg  po BID 2 weeks on, 1 week off; began 05/25/2023, second cycle delayed to 07/06/2023   06/08/2023 - 07/21/2023 Radiation Therapy   First Treatment Date: 2023-06-08 - Last Treatment Date: 2023-07-21   Plan Name: CW_R_BO Site: Chest Wall, Right Technique: 3D Mode: Photon Dose Per Fraction: 2 Gy Prescribed Dose (Delivered / Prescribed): 26 Gy / 26 Gy Prescribed Fxs (Delivered / Prescribed): 13 / 13   Plan Name: CW_R_SCV_PAB Site: Chest Wall, Right Technique: 3D Mode: Photon Dose Per Fraction: 2 Gy Prescribed Dose (Delivered / Prescribed): 50 Gy / 50 Gy Prescribed Fxs (Delivered / Prescribed): 25 / 25   Plan Name: CW_R_Bst_BO Site: Chest Wall, Right Technique: Electron Mode: Electron Dose Per Fraction: 2 Gy Prescribed Dose (Delivered / Prescribed): 14 Gy / 14 Gy Prescribed Fxs (Delivered / Prescribed): 7 / 7   Plan Name: CW_R Site: Chest Wall, Right Technique: 3D Mode: Photon Dose Per Fraction: 2 Gy Prescribed Dose (Delivered / Prescribed): 24 Gy / 24 Gy Prescribed Fxs (Delivered / Prescribed): 12 / 12   08/09/2024 -  Chemotherapy   Patient is on Treatment Plan : BREAST Fam-Trastuzumab Deruxtecan-nxki (Enhertu) (5.4) q21d         INTERVAL HISTORY  Discussed the use of AI scribe software for clinical note transcription with the patient, who gave verbal consent to proceed.    Caitlin [name] is a 68 y.o. female with oncologic history as above presenting to [facility] today with  chief complaint of       ROS  All other systems are reviewed and are negative for acute change except as noted in the HPI.    Allergies  Allergen Reactions  . Penicillins Rash    childhood reaction     Past Medical History:  Diagnosis Date  . Asthma   . Breast cancer (HCC)    right  . Complication of anesthesia   . Dyspnea   . Dysrhythmia    2012  . Headache   . History of kidney stones    passed  . Hypothyroidism   . Pneumonia 01/26/2023  . PONV (postoperative nausea and vomiting)   . rt breast ca 08/2022   Right Breast  . Scoliosis   . Thyroid  disease      Past Surgical History:  Procedure Laterality Date  . APPENDECTOMY    . AXILLARY SENTINEL NODE BIOPSY Right 10/14/2022   Procedure: RIGHT AXILLARY SENTINEL NODE BIOPSY;  Surgeon: Ebbie Cough, MD;  Location: [facility] OR;  Service: General;  Laterality: Right;  . BREAST BIOPSY Right 09/15/2022   US  RT BREAST BX W LOC DEV 1ST LESION IMG BX SPEC US  GUIDE 09/15/2022 [facility] MAMMOGRAPHY  . BREAST BIOPSY  10/12/2022   US  RT RADIOACTIVE SEED LOC 10/12/2022 [facility] MAMMOGRAPHY  . BREAST LUMPECTOMY WITH RADIOACTIVE SEED AND SENTINEL LYMPH NODE BIOPSY Right 10/14/2022   Procedure: RIGHT BREAST LUMPECTOMY WITH RADIOACTIVE SEED;  Surgeon: Ebbie Cough, MD;  Location: [facility] OR;  Service: General;  Laterality: Right;  . COLONOSCOPY    . LAPAROSCOPIC APPENDECTOMY    . MASTECTOMY Right   . PORT-A-CATH REMOVAL Left 05/03/2023   Procedure: REMOVAL PORT-A-CATH;  Surgeon: Ebbie Cough, MD;  Location: [facility] OR;  Service: General;  Laterality: Left;  . PORT-A-CATH REMOVAL Left 12/22/2023   Procedure: REMOVAL PORT-A-CATH;  Surgeon: Ebbie Cough, MD;  Location: [facility];  Service: General;  Laterality: Left;  . PORTACATH PLACEMENT Left 10/14/2022   Procedure: INSERTION PORT-A-CATH;  Surgeon: Ebbie Cough, MD;  Location: [facility] OR;  Service: General;  Laterality: Left;  . PORTACATH PLACEMENT Left  05/19/2023   Procedure: PORT PLACEMENT WITH ULTRASOUND GUIDANCE;  Surgeon: Ebbie Cough, MD;  Location: [facility];  Service: General;  Laterality: Left;  . RE-EXCISION OF BREAST LUMPECTOMY Right 11/08/2022   Procedure: RE-EXCISION OF RIGHT BREAST LUMPECTOMY;  Surgeon: Ebbie Cough, MD;  Location: [facility];  Service: General;  Laterality: Right;  . RE-EXCISION OF BREAST LUMPECTOMY Right 11/18/2022   Procedure: RE-EXCISION OF RIGHT BREAST LUMPECTOMY;  Surgeon: Ebbie Cough, MD;  Location: [facility] OR;  Service: General;  Laterality: Right;  . SIMPLE MASTECTOMY WITH AXILLARY SENTINEL NODE BIOPSY Right 05/03/2023   Procedure: RIGHT MASTECTOMY;  Surgeon: Ebbie Cough, MD;  Location: [facility] OR;  Service: General;  Laterality: Right;  GEN & PEC BLOCK  . WISDOM TOOTH EXTRACTION      Social History   Socioeconomic History  . Marital status: Married    Spouse name: Not on file  . Number of children: Not on file  . Years of education: Not on file  . Highest education level: Not on file  Occupational History  . Not on file  Tobacco Use  . Smoking status: Never  . Smokeless tobacco: Not on file  Vaping Use  . Vaping status: Never Used  Substance and Sexual Activity  . Alcohol use: Not Currently  . Drug use: Never  . Sexual activity: Not Currently  Other Topics Concern  . Not on file  Social History Narrative  . Not on file   Social Drivers of Health   Financial Resource Strain: Not on file  Food Insecurity: No Food Insecurity (05/27/2023)   Hunger Vital Sign   . Worried About Programme Researcher, Broadcasting/film/video in the Last Year: Never true   . Ran Out of Food in the Last Year: Never true  Transportation Needs: No Transportation Needs (05/27/2023)   PRAPARE - Transportation   . Lack of Transportation (Medical): No   . Lack of Transportation (Non-Medical): No  Physical Activity: Sufficiently Active (06/19/2020)   [facility]   Exercise  Vital Sign   . On average, how many days per week do you engage in moderate to strenuous exercise (like a brisk walk)?: 7 days   . On average, how many minutes do you engage in exercise at this level?: 30 min  Stress: Not on file  Social Connections: Not on file  Intimate Partner Violence: Not At Risk (05/27/2023)   Humiliation, Afraid, Rape, and Kick questionnaire   . Fear of Current or Ex-Partner: No   . Emotionally Abused: No   . Physically Abused: No   . Sexually Abused: No    Family History  Problem Relation Age of Onset  . Thyroid  disease Mother   . Prostate cancer Father 74       metastatic  . Hypertension Father   .  Thyroid  disease Sister        twin sister age 47  . Asthma Sister   . Prostate cancer Brother 64  . Heart attack Maternal Grandmother        died age 83's  . Heart attack Maternal Grandfather        died age 5's  . Clotting disorder Paternal Grandmother        cerebral hemorrage     Current Outpatient Medications:  .  albuterol  (VENTOLIN  HFA) 108 (90 Base) MCG/ACT inhaler, Inhale 2 puffs into the lungs every 4 (four) hours as needed for shortness of breath (Asthma)., Disp: , Rfl:  .  alendronate  (FOSAMAX ) 70 MG tablet, Take 1 tablet (70 mg total) by mouth once a week. Take with a full glass of water on an empty stomach., Disp: 12 tablet, Rfl: 3 .  aspirin 81 MG tablet, Take 81 mg by mouth daily.  , Disp: , Rfl:  .  dexamethasone  (DECADRON ) 4 MG tablet, Take 1 tablets  by mouth daily for 3 days starting the day after chemotherapy. Take with food., Disp: 30 tablet, Rfl: 1 .  diltiazem  (CARDIZEM  CD) 120 MG 24 hr capsule, Take 120 mg by mouth daily., Disp: , Rfl:  .  levothyroxine  (SYNTHROID ) 75 MCG tablet, Take 75 mcg by mouth daily before breakfast., Disp: , Rfl:  .  lidocaine -prilocaine  (EMLA ) cream, Apply to affected area once, Disp: 30 g, Rfl: 3 .  Multiple Vitamins-Minerals (MULTIVITAMIN WITH MINERALS) tablet, Take 1 tablet by mouth daily., Disp: , Rfl:   .  ondansetron  (ZOFRAN ) 8 MG tablet, Take 1 tablet (8 mg total) by mouth every 8 (eight) hours as needed for nausea or vomiting. Start on the third day after chemotherapy., Disp: 30 tablet, Rfl: 1 .  OVER THE COUNTER MEDICATION, Take 1 capsule by mouth 2 (two) times daily. copaiba softgels, Disp: , Rfl:  .  oxyCODONE  (OXY IR/ROXICODONE ) 5 MG immediate release tablet, Take 1 tablet (5 mg total) by mouth every 6 (six) hours as needed for severe pain (pain score 7-10)., Disp: 60 tablet, Rfl: 0 .  prochlorperazine  (COMPAZINE ) 10 MG tablet, Take 1 tablet (10 mg total) by mouth every 6 (six) hours as needed for nausea or vomiting., Disp: 30 tablet, Rfl: 1 .  PULMICORT  FLEXHALER 180 MCG/ACT inhaler, Inhale 1 puff into the lungs daily., Disp: , Rfl:   PHYSICAL EXAM ECOG FS:{CHL ONC ED:[id number]}   There were no vitals filed for this visit. Physical Exam     LABORATORY DATA I have reviewed the data as listed    Latest Ref Rng & Units 05/31/2024    9:34 AM 02/15/2024    8:36 AM 10/20/2023    9:09 AM  CBC  WBC 4.0 - 10.5 K/uL 5.5  9.4  7.2   Hemoglobin 12.0 - 15.0 g/dL 87.2  86.9  87.9   Hematocrit 36.0 - 46.0 % 39.0  38.6  35.0   Platelets 150 - 400 K/uL 279  319  266         Latest Ref Rng & Units 05/31/2024    9:34 AM 02/15/2024    8:36 AM 10/20/2023    9:09 AM  CMP  Glucose 70 - 99 mg/dL 82  70  86   BUN 8 - 23 mg/dL 14  14  21    Creatinine 0.44 - 1.00 mg/dL 9.06  9.03  9.13   Sodium 135 - 145 mmol/L 139  139  139   Potassium 3.5 - 5.1  mmol/L 4.4  4.1  4.2   Chloride 98 - 111 mmol/L 105  106  106   CO2 22 - 32 mmol/L 28  30  27    Calcium 8.9 - 10.3 mg/dL 9.1  9.3  9.7   Total Protein 6.5 - 8.1 g/dL 6.8  6.9  6.6   Total Bilirubin 0.0 - 1.2 mg/dL 0.4  0.3  0.6   Alkaline Phos 38 - 126 U/L 53  79  114   AST 15 - 41 U/L 24  16  17    ALT 0 - 44 U/L 19  13  11         RADIOGRAPHIC STUDIES (from last 24 hours if applicable) I have personally reviewed the radiological images as  listed and agreed with the findings in the report. No results found.      Visit Diagnosis: No diagnosis found.   No orders of the defined types were placed in this encounter.   All questions were answered. The patient knows to call the clinic with any problems, questions or concerns. No barriers to learning was detected.  A total of more than *** minutes were spent on this encounter with face-to-face time and non-face-to-face time, including preparing to see the patient, ordering tests and/or medications, counseling the patient and coordination of care as outlined above.    Thank you for allowing me to participate in the care of this patient.    Tiondra Fang E  Walisiewicz, PA-C Department of Hematology/Oncology [facility] Phone: [phone]  [phone]    08/01/2024 10:25 AM

## 2024-08-01 NOTE — Procedures (Signed)
 Ultrasound-guided therapeutic left thoracentesis performed yielding 1.1 liters of hazy, yellow fluid. No immediate complications. Follow-up chest x-ray pending. EBL none.

## 2024-08-01 NOTE — Progress Notes (Unsigned)
 Patient Care Team: Toribio Jerel MATSU, MD as PCP - General (Unknown Physician Specialty) Tyree Nanetta SAILOR, RN as Oncology Nurse Navigator Odean Potts, MD as Consulting Physician (Hematology and Oncology) Shannon Agent, MD as Consulting Physician (Radiation Oncology) Ebbie Cough, MD as Consulting Physician (General Surgery)  DIAGNOSIS:  Encounter Diagnosis  Name Primary?   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female, estrogen receptor positive (HCC)     SUMMARY OF ONCOLOGIC HISTORY: Oncology History  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female, estrogen receptor positive (HCC)  09/15/2022 Initial Diagnosis   Screening mammogram detected right breast mass which measured 1.4 cm by ultrasound at 10 o'clock position, biopsy revealed grade 3 IDC with lymphovascular invasion ER 10%, PR 0%, Ki-67 30%, HER2 0 negative   09/25/2022 Cancer Staging   Staging form: Breast, AJCC 8th Edition - Clinical: Stage IB (cT1c, cN0, cM0, G3, ER+, PR-, HER2-) - Signed by Odean Potts, MD on 09/25/2022 Histologic grading system: 3 grade system    Genetic Testing   [facility] CancerNext-Expanded Panel+RNA was Negative. Report date is 10/06/2021.  The CancerNext-Expanded gene panel offered by [facility] and includes sequencing, rearrangement, and RNA analysis for the following 77 genes: AIP, ALK, APC, ATM, AXIN2, BAP1, BARD1, BLM, BMPR1A, BRCA1, BRCA2, BRIP1, CDC73, CDH1, CDK4, CDKN1B, CDKN2A, CHEK2, CTNNA1, DICER1, FANCC, FH, FLCN, GALNT12, KIF1B, LZTR1, MAX, MEN1, MET, MLH1, MSH2, MSH3, MSH6, MUTYH, NBN, NF1, NF2, NTHL1, PALB2, PHOX2B, PMS2, POT1, PRKAR1A, PTCH1, PTEN, RAD51C, RAD51D, RB1, RECQL, RET, SDHA, SDHAF2, SDHB, SDHC, SDHD, SMAD4, SMARCA4, SMARCB1, SMARCE1, STK11, SUFU, TMEM127, TP53, TSC1, TSC2, VHL and XRCC2 (sequencing and deletion/duplication); EGFR, EGLN1, HOXB13, KIT, MITF, PDGFRA, POLD1, and POLE (sequencing only); EPCAM and GREM1  (deletion/duplication only).    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 with high-grade DCIS, inferior margin positive, angiolymphatic invasion present, 5/5 lymph nodes positive, additional medial margin: Poorly differentiated carcinoma with lymphoid stroma, additional inferior margin: Positive, ER 10% weak, PR 0%, HER2 0, Ki-67 30%   10/21/2022 Cancer Staging   Staging form: Breast, AJCC 8th Edition - Pathologic: Stage IIIC (pT1c, pN2a, cM0, G3, ER-, PR-, HER2-) - Signed by Odean Potts, MD on 10/21/2022 Stage prefix: Initial diagnosis Histologic grading system: 3 grade system   11/08/2022 Surgery   Margin reexcision: Inferior margin: Grade 3 IDC, new margin positive Medial margin: Grade 3 IDC new margin widely involved   11/18/2022 Surgery   Right inferior margin reexcision: Invasive poorly differentiated adenocarcinoma grade 3 present in new inferior margin.  Medial margin reexcision: Invasive poorly differentiated adenocarcinoma grade 3 present at new medial margin, posterior margin: Poorly differentiated adenocarcinoma grade 3, margin negative  11/08/2022 and 11/18/2022: Final margin focal positivity. After much discussion the plan is to finish her systemic treatment and come back at a later point to do mastectomy.    12/02/2022 - 04/18/2023 Chemotherapy   Patient is on Treatment Plan : BREAST ADJUVANT DOSE DENSE AC q14d / PACLitaxel  q7d     05/03/2023 Surgery   Right mastectomy: 8 cm grade 3 IDC with extensive lymphatic and vascular invasion RCB class III, deep margin multifocally positive, 5/5 lymph nodes positive, ER weak 10%, PR 0%, HER2 0, Ki-67 30%   05/25/2023 - 08/17/2023 Chemotherapy   Patient is on Treatment Plan : BREAST Pembrolizumab  (200) q21d x 24 months     05/25/2023 -  Adjuvant Chemotherapy   Capectiabine 1000mg  po BID 2 weeks  on, 1 week off; began 05/25/2023, second cycle delayed to  07/06/2023   06/08/2023 - 07/21/2023 Radiation Therapy   First Treatment Date: 2023-06-08 - Last Treatment Date: 2023-07-21   Plan Name: CW_R_BO Site: Chest Wall, Right Technique: 3D Mode: Photon Dose Per Fraction: 2 Gy Prescribed Dose (Delivered / Prescribed): 26 Gy / 26 Gy Prescribed Fxs (Delivered / Prescribed): 13 / 13   Plan Name: CW_R_SCV_PAB Site: Chest Wall, Right Technique: 3D Mode: Photon Dose Per Fraction: 2 Gy Prescribed Dose (Delivered / Prescribed): 50 Gy / 50 Gy Prescribed Fxs (Delivered / Prescribed): 25 / 25   Plan Name: CW_R_Bst_BO Site: Chest Wall, Right Technique: Electron Mode: Electron Dose Per Fraction: 2 Gy Prescribed Dose (Delivered / Prescribed): 14 Gy / 14 Gy Prescribed Fxs (Delivered / Prescribed): 7 / 7   Plan Name: CW_R Site: Chest Wall, Right Technique: 3D Mode: Photon Dose Per Fraction: 2 Gy Prescribed Dose (Delivered / Prescribed): 24 Gy / 24 Gy Prescribed Fxs (Delivered / Prescribed): 12 / 12   08/09/2024 -  Chemotherapy   Patient is on Treatment Plan : BREAST Fam-Trastuzumab Deruxtecan-nxki (Enhertu) (5.4) q21d       CHIEF COMPLIANT: Worsening shortness of breath since 24 hours  HISTORY OF PRESENT ILLNESS: History of Present Illness Caitlin [name] is a 68 year old female with metastatic breast cancer who presents with worsening shortness of breath.  She experiences significant worsening of dyspnea, especially at night and in the morning, exacerbated by physical activity. Albuterol  inhaler provides some relief, but symptoms persist. Oxygen saturation is 95-98% at rest and drops to 89-92% with exertion. No wheezing, edema, or calf tenderness.  She describes a sensation of growth and tightness in her chest, similar to previous breast cancer growth, with discoloration and a bruising appearance. Imaging has shown a large mass in the back area.  She takes narcotics for pain management, experiencing significant pain at night, affecting her  sleep.     ALLERGIES:  is allergic to penicillins.  MEDICATIONS:  Current Outpatient Medications  Medication Sig Dispense Refill   albuterol  (VENTOLIN  HFA) 108 (90 Base) MCG/ACT inhaler Inhale 2 puffs into the lungs every 4 (four) hours as needed for shortness of breath (Asthma).     alendronate  (FOSAMAX ) 70 MG tablet Take 1 tablet (70 mg total) by mouth once a week. Take with a full glass of water on an empty stomach. 12 tablet 3   aspirin 81 MG tablet Take 81 mg by mouth daily.       dexamethasone  (DECADRON ) 4 MG tablet Take 1 tablets  by mouth daily for 3 days starting the day after chemotherapy. Take with food. 30 tablet 1   diltiazem  (CARDIZEM  CD) 120 MG 24 hr capsule Take 120 mg by mouth daily.     levothyroxine  (SYNTHROID ) 75 MCG tablet Take 75 mcg by mouth daily before breakfast.     lidocaine -prilocaine  (EMLA ) cream Apply to affected area once 30 g 3   Multiple Vitamins-Minerals (MULTIVITAMIN WITH MINERALS) tablet Take 1 tablet by mouth daily.     ondansetron  (ZOFRAN ) 8 MG tablet Take 1 tablet (8 mg total) by mouth every 8 (eight) hours as needed for nausea or vomiting. Start on the third day after chemotherapy. 30 tablet 1   OVER THE COUNTER MEDICATION Take 1 capsule by mouth 2 (two) times daily. copaiba softgels     oxyCODONE  (OXY IR/ROXICODONE ) 5 MG immediate release tablet Take 1 tablet (5 mg total) by mouth every 6 (six) hours as needed  for severe pain (pain score 7-10). 60 tablet 0   prochlorperazine  (COMPAZINE ) 10 MG tablet Take 1 tablet (10 mg total) by mouth every 6 (six) hours as needed for nausea or vomiting. 30 tablet 1   PULMICORT  FLEXHALER 180 MCG/ACT inhaler Inhale 1 puff into the lungs daily.     No current facility-administered medications for this visit.    PHYSICAL EXAMINATION: ECOG PERFORMANCE STATUS: 1 - Symptomatic but completely ambulatory  Vitals:   08/01/24 1323  BP: 94/60  Pulse: (!) 108  Resp: 20  Temp: 98.6 F (37 C)  SpO2: 96%   Filed  Weights   08/01/24 1323  Weight: 143 lb 9.6 oz (65.1 kg)    Physical Exam CHEST: No wheezing on auscultation. No edema. No calf tenderness. Negative Homan's sign.  (exam performed in the presence of a chaperone)  LABORATORY DATA:  I have reviewed the data as listed    Latest Ref Rng & Units 05/31/2024    9:34 AM 02/15/2024    8:36 AM 10/20/2023    9:09 AM  CMP  Glucose 70 - 99 mg/dL 82  70  86   BUN 8 - 23 mg/dL 14  14  21    Creatinine 0.44 - 1.00 mg/dL 9.06  9.03  9.13   Sodium 135 - 145 mmol/L 139  139  139   Potassium 3.5 - 5.1 mmol/L 4.4  4.1  4.2   Chloride 98 - 111 mmol/L 105  106  106   CO2 22 - 32 mmol/L 28  30  27    Calcium 8.9 - 10.3 mg/dL 9.1  9.3  9.7   Total Protein 6.5 - 8.1 g/dL 6.8  6.9  6.6   Total Bilirubin 0.0 - 1.2 mg/dL 0.4  0.3  0.6   Alkaline Phos 38 - 126 U/L 53  79  114   AST 15 - 41 U/L 24  16  17    ALT 0 - 44 U/L 19  13  11      Lab Results  Component Value Date   WBC 5.5 05/31/2024   HGB 12.7 05/31/2024   HCT 39.0 05/31/2024   MCV 95.8 05/31/2024   PLT 279 05/31/2024   NEUTROABS 3.6 05/31/2024    ASSESSMENT & PLAN:  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female, estrogen receptor positive (HCC) 10/14/2022:Right lumpectomy: 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  No distant metastatic disease. Bone scan 10/29/2022: No evidence of bone metastasis   11/08/2022 and 11/18/2022: Margin reexcision surgeries: Final  margin focal positivity.  After much discussion the plan is to finish her systemic treatment and come back at a later point to do mastectomy.   Treatment summary: Adjuvant chemotherapy with dose dense Adriamycin  and Cytoxan  followed by Taxol  to be completed 05/02/2023, Keytruda  05/25/2023-08/17/2023 (discontinued due to immune mediated adverse effects: Skin rash) Mastectomy by Dr. Ebbie 05/03/2023:Right mastectomy: 8 cm grade 3 IDC with extensive lymphatic and vascular invasion RCB class III, deep margin multifocally positive, 5/5 lymph nodes positive, ER weak 10%, PR 0%, HER2 0, Ki-67 30%,  total lymph nodes: 10/10 (prior lumpectomy she had 5 positive nodes) Adjuvant radiation to start 06/09/2023-07/21/23 Capecitabine  started 05/25/2023-11/25/2023 ------------------------------------------------------------------------------------------------------------------------------------------------ Guardant reveal 12/14/2023: CT DNA detected 2.7% 02/16/2024: CT CAP: Interval enlargement of left superior mediastinal lymph node 2 x 1.8 cm concerning for metastatic disease 02/26/2024: PET/CT: Few hypermetabolic lymph nodes identified consistent with metastatic disease including left upper mediastinal, prevascular, left internal mammary and right axillary region CT CAP 05/22/2024: New left internal jugular lymph node 1.4 cm.  Enlarging mediastinal lymph nodes 1.2 cm (was 0.4 cm)   Right axillary lymph node biopsy: Positive for metastatic ductal carcinoma ER 0%, PR 0%, HER2 0, Ki-67 50% Guardant 360 12/26/2023: T p53 present, TMB 5.69, MSI high not detected  07/30/2024: CT CAP: New irregular soft tissue mass anterior mediastinum, increased in size and number of lower cervical, mediastinal, left hilar and left axillary nodes.  Large infiltrating mass right latissimus dorsi muscle which is up to intercostal muscles, right upper lobe lymphangitic carcinomatosis changes, no bone metastasis  Shortness of breath: After  reviewing the CT scans it appears that she has moderate pleural effusion.  We would like to see if that can be drained so that she can tolerate the upcoming trip to [place]  and [place].  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 Imaging shows a large infiltrative mass limiting lung expansion and multiple enlarged lymph nodes. Tumor around the hilum possibly constricting the bronchial airway. - Administer chemotherapy every three weeks for three cycles. - Perform a scan after six weeks to evaluate treatment response.  Malignant pleural effusion, left greater than right Malignant pleural effusion with moderate fluid accumulation on the left side and small amount on the right. - Consult radiology for possible thoracentesis to drain left pleural effusion.  Atelectasis of right lung Atelectasis in the right lung likely due to tumor involvement and lymphatic spread causing airway obstruction.  Dyspnea on exertion Dyspnea on exertion worsened with oxygen saturation dropping during activity. Likely multifactorial due to pleural effusion, atelectasis, and tumor burden.  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 - Continue current pain management regimen with narcotics as needed.      No orders of the defined types were placed in this encounter.  The patient has a good understanding of the overall plan. she agrees with it. she will call with any problems that may develop before the next visit here.  I personally spent a total of 45 minutes in the care of the patient today including preparing to see the patient, getting/reviewing separately obtained history, performing a medically  appropriate exam/evaluation, counseling and educating, placing orders, referring and communicating with other health care professionals, documenting clinical information in the EHR, independently interpreting results, communicating results, and coordinating care.   Viinay K Hosam Mcfetridge, MD 08/01/24

## 2024-08-02 ENCOUNTER — Encounter: Payer: Self-pay | Admitting: Hematology and Oncology

## 2024-08-02 ENCOUNTER — Telehealth: Payer: Self-pay

## 2024-08-02 ENCOUNTER — Other Ambulatory Visit (HOSPITAL_COMMUNITY): Payer: Self-pay

## 2024-08-02 NOTE — Telephone Encounter (Signed)
 Oral Oncology Patient Advocate Encounter  Prior Authorization for Ondansetron  has been approved.    Key [id number]  Effective dates: 08/02/2024 through 10/03/2024  Patients co-pay is $0.00.  Patient has been notified via MyChart    Charlott Hamilton,  CPhT-Adv  she/her/hers [facility]  [facility] Pharmacy Technician Patient Advocate Specialist III [facility] Phone: [phone]  Fax: [phone] [email or web address]

## 2024-08-02 NOTE — Telephone Encounter (Signed)
 Oral Oncology Patient Advocate Encounter   Received notification that prior authorization for Ondansetron  is required.   PA submitted on 08/02/24 Key [id number] Status is pending      Charlott Hamilton,  CPhT-Adv  she/her/hers [facility]  [facility] Pharmacy Technician Patient Advocate Specialist III [facility] Phone: [phone]  Fax: [phone] [email or web address]

## 2024-08-06 ENCOUNTER — Inpatient Hospital Stay: Admitting: Hematology and Oncology

## 2024-08-06 ENCOUNTER — Encounter: Admitting: Rehabilitation

## 2024-08-06 ENCOUNTER — Other Ambulatory Visit: Payer: Self-pay

## 2024-08-07 ENCOUNTER — Other Ambulatory Visit: Payer: Self-pay | Admitting: *Deleted

## 2024-08-07 ENCOUNTER — Encounter: Payer: Self-pay | Admitting: Hematology and Oncology

## 2024-08-07 DIAGNOSIS — Z170 Estrogen receptor positive status [ER+]: Secondary | ICD-10-CM

## 2024-08-07 DIAGNOSIS — J90 Pleural effusion, not elsewhere classified: Secondary | ICD-10-CM

## 2024-08-08 ENCOUNTER — Encounter: Admitting: Rehabilitation

## 2024-08-08 MED FILL — Fosaprepitant Dimeglumine For IV Infusion 150 MG (Base Eq): INTRAVENOUS | Qty: 5 | Status: AC

## 2024-08-09 ENCOUNTER — Inpatient Hospital Stay: Attending: Hematology and Oncology

## 2024-08-09 ENCOUNTER — Inpatient Hospital Stay

## 2024-08-09 ENCOUNTER — Inpatient Hospital Stay: Admitting: Hematology and Oncology

## 2024-08-09 ENCOUNTER — Ambulatory Visit (HOSPITAL_COMMUNITY)
Admission: RE | Admit: 2024-08-09 | Discharge: 2024-08-09 | Disposition: A | Discharge status: Home / Self Care | Source: Ambulatory Visit | Attending: Hematology and Oncology | Admitting: Hematology and Oncology

## 2024-08-09 VITALS — BP 117/70 | HR 88 | Temp 98.3°F | Resp 18 | Wt 148.9 lb

## 2024-08-09 DIAGNOSIS — Z1722 Progesterone receptor negative status: Secondary | ICD-10-CM | POA: Insufficient documentation

## 2024-08-09 DIAGNOSIS — C50411 Malignant neoplasm of upper-outer quadrant of right female breast: Secondary | ICD-10-CM

## 2024-08-09 DIAGNOSIS — Z79899 Other long term (current) drug therapy: Secondary | ICD-10-CM | POA: Diagnosis not present

## 2024-08-09 DIAGNOSIS — Z170 Estrogen receptor positive status [ER+]: Secondary | ICD-10-CM

## 2024-08-09 DIAGNOSIS — Z5112 Encounter for antineoplastic immunotherapy: Secondary | ICD-10-CM | POA: Insufficient documentation

## 2024-08-09 DIAGNOSIS — C773 Secondary and unspecified malignant neoplasm of axilla and upper limb lymph nodes: Secondary | ICD-10-CM | POA: Diagnosis not present

## 2024-08-09 DIAGNOSIS — J90 Pleural effusion, not elsewhere classified: Secondary | ICD-10-CM | POA: Insufficient documentation

## 2024-08-09 DIAGNOSIS — Z1732 Human epidermal growth factor receptor 2 negative status: Secondary | ICD-10-CM | POA: Diagnosis not present

## 2024-08-09 DIAGNOSIS — R0602 Shortness of breath: Secondary | ICD-10-CM | POA: Diagnosis not present

## 2024-08-09 DIAGNOSIS — J9811 Atelectasis: Secondary | ICD-10-CM | POA: Diagnosis not present

## 2024-08-09 LAB — CBC WITH DIFFERENTIAL (CANCER CENTER ONLY)
Abs Immature Granulocytes: 0.07 K/uL (ref 0.00–0.07)
Basophils Absolute: 0.1 K/uL (ref 0.0–0.1)
Basophils Relative: 1 %
Eosinophils Absolute: 0.2 K/uL (ref 0.0–0.5)
Eosinophils Relative: 2 %
HCT: 32.5 % — ABNORMAL LOW (ref 36.0–46.0)
Hemoglobin: 10.9 g/dL — ABNORMAL LOW (ref 12.0–15.0)
Immature Granulocytes: 1 %
Lymphocytes Relative: 5 %
Lymphs Abs: 0.5 K/uL — ABNORMAL LOW (ref 0.7–4.0)
MCH: 32 pg (ref 26.0–34.0)
MCHC: 33.5 g/dL (ref 30.0–36.0)
MCV: 95.3 fL (ref 80.0–100.0)
Monocytes Absolute: 0.9 K/uL (ref 0.1–1.0)
Monocytes Relative: 10 %
Neutro Abs: 7.3 K/uL (ref 1.7–7.7)
Neutrophils Relative %: 81 %
Platelet Count: 579 K/uL — ABNORMAL HIGH (ref 150–400)
RBC: 3.41 MIL/uL — ABNORMAL LOW (ref 3.87–5.11)
RDW: 13.7 % (ref 11.5–15.5)
WBC Count: 9.1 K/uL (ref 4.0–10.5)
nRBC: 0 % (ref 0.0–0.2)

## 2024-08-09 LAB — CMP (CANCER CENTER ONLY)
ALT: 9 U/L (ref 0–44)
AST: 20 U/L (ref 15–41)
Albumin: 3.3 g/dL — ABNORMAL LOW (ref 3.5–5.0)
Alkaline Phosphatase: 64 U/L (ref 38–126)
Anion gap: 9 (ref 5–15)
BUN: 23 mg/dL (ref 8–23)
CO2: 23 mmol/L (ref 22–32)
Calcium: 8.6 mg/dL — ABNORMAL LOW (ref 8.9–10.3)
Chloride: 103 mmol/L (ref 98–111)
Creatinine: 0.77 mg/dL (ref 0.44–1.00)
GFR, Estimated: >60 mL/min (ref >60)
Glucose, Bld: 98 mg/dL (ref 70–99)
Potassium: 4 mmol/L (ref 3.5–5.1)
Sodium: 135 mmol/L (ref 135–145)
Total Bilirubin: 0.4 mg/dL (ref 0.0–1.2)
Total Protein: 6.7 g/dL (ref 6.5–8.1)

## 2024-08-09 MED ORDER — DEXTROSE 5 % IV SOLN: INTRAVENOUS | Status: DC

## 2024-08-09 MED ORDER — ACETAMINOPHEN 325 MG PO TABS
650.0000 mg | ORAL_TABLET | Freq: Once | ORAL | Status: DC
  Filled 2024-08-09: qty 2

## 2024-08-09 MED ORDER — FAM-TRASTUZUMAB DERUXTECAN-NXKI CHEMO 100 MG IV SOLR
4.4000 mg/kg | Freq: Once | INTRAVENOUS | Status: AC
  Administered 2024-08-09: 300 mg via INTRAVENOUS
  Filled 2024-08-09: qty 15

## 2024-08-09 MED ORDER — DEXAMETHASONE SOD PHOSPHATE PF 10 MG/ML IJ SOLN
10.0000 mg | Freq: Once | INTRAMUSCULAR | Status: AC
  Administered 2024-08-09: 10 mg via INTRAVENOUS

## 2024-08-09 MED ORDER — SODIUM CHLORIDE 0.9 % IV SOLN
150.0000 mg | Freq: Once | INTRAVENOUS | Status: AC
  Administered 2024-08-09: 150 mg via INTRAVENOUS
  Filled 2024-08-09: qty 150

## 2024-08-09 MED ORDER — DIPHENHYDRAMINE HCL 25 MG PO CAPS
25.0000 mg | ORAL_CAPSULE | Freq: Once | ORAL | Status: AC
  Administered 2024-08-09: 25 mg via ORAL
  Filled 2024-08-09: qty 1

## 2024-08-09 MED ORDER — PALONOSETRON HCL INJECTION 0.25 MG/5ML
0.2500 mg | Freq: Once | INTRAVENOUS | Status: AC
  Administered 2024-08-09: 0.25 mg via INTRAVENOUS
  Filled 2024-08-09: qty 5

## 2024-08-09 NOTE — Assessment & Plan Note (Addendum)
 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  No distant metastatic disease. Bone scan 10/29/2022: No evidence of bone metastasis   11/08/2022 and 11/18/2022: Margin reexcision surgeries: Final margin focal positivity.  After much discussion the plan is to finish her systemic treatment and come back at a later point to do mastectomy.   Treatment summary: Adjuvant chemotherapy with dose dense Adriamycin  and Cytoxan  followed by Taxol  to be completed 05/02/2023, Keytruda  05/25/2023-08/17/2023 (discontinued due to immune mediated adverse effects: Skin rash) Mastectomy by Dr. Ebbie 05/03/2023:Right mastectomy: 8 cm grade 3 IDC with extensive lymphatic and vascular invasion RCB class III, deep margin multifocally positive, 5/5 lymph nodes positive, ER weak 10%, PR 0%, HER2 0, Ki-67 30%, total lymph nodes: 10/10 (prior lumpectomy she had 5 positive nodes) Adjuvant radiation to start 06/09/2023-07/21/23 Capecitabine  started 05/25/2023-11/25/2023 ------------------------------------------------------------------------------------------------------------------------------------------------ Guardant reveal 12/14/2023: CT DNA detected 2.7% 02/16/2024: CT CAP: Interval enlargement of left superior mediastinal lymph node 2 x 1.8 cm concerning for metastatic disease   Right axillary lymph node biopsy: Positive for metastatic ductal carcinoma ER 0%, PR 0%, HER2 0, Ki-67 50% Guardant  360 12/26/2023: T p53 present, TMB 5.69, MSI high not detected   07/30/2024: CT CAP: New irregular soft tissue mass anterior mediastinum, increased in size and number of lower cervical, mediastinal, left hilar and left axillary nodes.  Large infiltrating mass right latissimus dorsi muscle which is up to intercostal muscles, right upper lobe lymphangitic carcinomatosis changes, no bone metastasis   Shortness of breath: Status post thoracentesis, 1.1 L removed on 08/01/2024  Current treatment: Enhertu cycle 1 Labs reviewed, chemo education completed, chemo consent obtained. Echocardiogram 07/27/2024: EF 60 to 65%   Follow-up in 1 week for toxicity check

## 2024-08-09 NOTE — Progress Notes (Signed)
 Patient Care Team: Toribio Jerel MATSU, MD as PCP - General (Unknown Physician Specialty) Tyree Nanetta SAILOR, RN as Oncology Nurse Navigator Odean Potts, MD as Consulting Physician (Hematology and Oncology) Shannon Agent, MD as Consulting Physician (Radiation Oncology) Ebbie Cough, MD as Consulting Physician (General Surgery)  DIAGNOSIS:  Encounter Diagnosis  Name Primary?   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female, estrogen receptor positive (HCC) Yes    SUMMARY OF ONCOLOGIC HISTORY: Oncology History  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female, estrogen receptor positive (HCC)  09/15/2022 Initial Diagnosis   Screening mammogram detected right breast mass which measured 1.4 cm by ultrasound at 10 o'clock position, biopsy revealed grade 3 IDC with lymphovascular invasion ER 10%, PR 0%, Ki-67 30%, HER2 0 negative   09/25/2022 Cancer Staging   Staging form: Breast, AJCC 8th Edition - Clinical: Stage IB (cT1c, cN0, cM0, G3, ER+, PR-, HER2-) - Signed by Odean Potts, MD on 09/25/2022 Histologic grading system: 3 grade system    Genetic Testing   [facility] CancerNext-Expanded Panel+RNA was Negative. Report date is 10/06/2021.  The CancerNext-Expanded gene panel offered by [facility] and includes sequencing, rearrangement, and RNA analysis for the following 77 genes: AIP, ALK, APC, ATM, AXIN2, BAP1, BARD1, BLM, BMPR1A, BRCA1, BRCA2, BRIP1, CDC73, CDH1, CDK4, CDKN1B, CDKN2A, CHEK2, CTNNA1, DICER1, FANCC, FH, FLCN, GALNT12, KIF1B, LZTR1, MAX, MEN1, MET, MLH1, MSH2, MSH3, MSH6, MUTYH, NBN, NF1, NF2, NTHL1, PALB2, PHOX2B, PMS2, POT1, PRKAR1A, PTCH1, PTEN, RAD51C, RAD51D, RB1, RECQL, RET, SDHA, SDHAF2, SDHB, SDHC, SDHD, SMAD4, SMARCA4, SMARCB1, SMARCE1, STK11, SUFU, TMEM127, TP53, TSC1, TSC2, VHL and XRCC2 (sequencing and deletion/duplication); EGFR, EGLN1, HOXB13, KIT, MITF, PDGFRA, POLD1, and POLE (sequencing only); EPCAM and GREM1  (deletion/duplication only).    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 with high-grade DCIS, inferior margin positive, angiolymphatic invasion present, 5/5 lymph nodes positive, additional medial margin: Poorly differentiated carcinoma with lymphoid stroma, additional inferior margin: Positive, ER 10% weak, PR 0%, HER2 0, Ki-67 30%   10/21/2022 Cancer Staging   Staging form: Breast, AJCC 8th Edition - Pathologic: Stage IIIC (pT1c, pN2a, cM0, G3, ER-, PR-, HER2-) - Signed by Odean Potts, MD on 10/21/2022 Stage prefix: Initial diagnosis Histologic grading system: 3 grade system   11/08/2022 Surgery   Margin reexcision: Inferior margin: Grade 3 IDC, new margin positive Medial margin: Grade 3 IDC new margin widely involved   11/18/2022 Surgery   Right inferior margin reexcision: Invasive poorly differentiated adenocarcinoma grade 3 present in new inferior margin.  Medial margin reexcision: Invasive poorly differentiated adenocarcinoma grade 3 present at new medial margin, posterior margin: Poorly differentiated adenocarcinoma grade 3, margin negative  11/08/2022 and 11/18/2022: Final margin focal positivity. After much discussion the plan is to finish her systemic treatment and come back at a later point to do mastectomy.    12/02/2022 - 04/18/2023 Chemotherapy   Patient is on Treatment Plan : BREAST ADJUVANT DOSE DENSE AC q14d / PACLitaxel  q7d     05/03/2023 Surgery   Right mastectomy: 8 cm grade 3 IDC with extensive lymphatic and vascular invasion RCB class III, deep margin multifocally positive, 5/5 lymph nodes positive, ER weak 10%, PR 0%, HER2 0, Ki-67 30%   05/25/2023 - 08/17/2023 Chemotherapy   Patient is on Treatment Plan : BREAST Pembrolizumab  (200) q21d x 24 months     05/25/2023 -  Adjuvant Chemotherapy   Capectiabine 1000mg  po BID 2 weeks  on, 1 week off; began 05/25/2023, second cycle delayed to  07/06/2023   06/08/2023 - 07/21/2023 Radiation Therapy   First Treatment Date: 2023-06-08 - Last Treatment Date: 2023-07-21   Plan Name: CW_R_BO Site: Chest Wall, Right Technique: 3D Mode: Photon Dose Per Fraction: 2 Gy Prescribed Dose (Delivered / Prescribed): 26 Gy / 26 Gy Prescribed Fxs (Delivered / Prescribed): 13 / 13   Plan Name: CW_R_SCV_PAB Site: Chest Wall, Right Technique: 3D Mode: Photon Dose Per Fraction: 2 Gy Prescribed Dose (Delivered / Prescribed): 50 Gy / 50 Gy Prescribed Fxs (Delivered / Prescribed): 25 / 25   Plan Name: CW_R_Bst_BO Site: Chest Wall, Right Technique: Electron Mode: Electron Dose Per Fraction: 2 Gy Prescribed Dose (Delivered / Prescribed): 14 Gy / 14 Gy Prescribed Fxs (Delivered / Prescribed): 7 / 7   Plan Name: CW_R Site: Chest Wall, Right Technique: 3D Mode: Photon Dose Per Fraction: 2 Gy Prescribed Dose (Delivered / Prescribed): 24 Gy / 24 Gy Prescribed Fxs (Delivered / Prescribed): 12 / 12   08/09/2024 -  Chemotherapy   Patient is on Treatment Plan : BREAST Fam-Trastuzumab Deruxtecan-nxki (Enhertu) (5.4) q21d       CHIEF COMPLIANT: Cycle 1 Enhertu  HISTORY OF PRESENT ILLNESS:   History of Present Illness Caitlin [name] is a 68 year old female with pleural effusions who presents for her first chemotherapy treatment.  She experiences worsening shortness of breath, initially improved after a previous procedure. An x-ray shows small bilateral pleural effusions, more fluid on the left side, not requiring drainage.  Significant fatigue is present, possibly related to anemia with hemoglobin at 10.9. Pain is noted in the area of a growth and sometimes in her arm, possibly due to swelling. She uses Tylenol  frequently, oxycodone  as needed, especially at night, and naproxen sodium twice a day.  Nausea is managed with Compazine  and Zofran , with Aloxi  and Emend prescribed for chemotherapy-related nausea prevention. Liver function tests are  pending. Platelet count is elevated at 579.     ALLERGIES:  is allergic to penicillins.  MEDICATIONS:  Current Outpatient Medications  Medication Sig Dispense Refill   albuterol  (VENTOLIN  HFA) 108 (90 Base) MCG/ACT inhaler Inhale 2 puffs into the lungs every 4 (four) hours as needed for shortness of breath (Asthma).     alendronate  (FOSAMAX ) 70 MG tablet Take 1 tablet (70 mg total) by mouth once a week. Take with a full glass of water on an empty stomach. 12 tablet 3   aspirin 81 MG tablet Take 81 mg by mouth daily.       dexamethasone  (DECADRON ) 4 MG tablet Take 1 tablets  by mouth daily for 3 days starting the day after chemotherapy. Take with food. 30 tablet 1   diltiazem  (CARDIZEM  CD) 120 MG 24 hr capsule Take 120 mg by mouth daily.     levothyroxine  (SYNTHROID ) 75 MCG tablet Take 75 mcg by mouth daily before breakfast.     lidocaine -prilocaine  (EMLA ) cream Apply to affected area once 30 g 3   Multiple Vitamins-Minerals (MULTIVITAMIN WITH MINERALS) tablet Take 1 tablet by mouth daily.     ondansetron  (ZOFRAN ) 8 MG tablet Take 1 tablet (8 mg total) by mouth every 8 (eight) hours as needed for nausea or vomiting. Start on the third day after chemotherapy. 30 tablet 1   OVER THE COUNTER MEDICATION Take 1 capsule by mouth 2 (two) times daily. copaiba softgels     oxyCODONE  (OXY IR/ROXICODONE ) 5 MG immediate release tablet Take 1 tablet (5 mg total)  by mouth every 6 (six) hours as needed for severe pain (pain score 7-10). 60 tablet 0   prochlorperazine  (COMPAZINE ) 10 MG tablet Take 1 tablet (10 mg total) by mouth every 6 (six) hours as needed for nausea or vomiting. 30 tablet 1   PULMICORT  FLEXHALER 180 MCG/ACT inhaler Inhale 1 puff into the lungs daily.     No current facility-administered medications for this visit.    PHYSICAL EXAMINATION: ECOG PERFORMANCE STATUS: 1 - Symptomatic but completely ambulatory  Vitals:   08/09/24 1031  BP: 117/70  Pulse: 88  Resp: 18  Temp: 98.3 F  (36.8 C)  SpO2: 96%   Filed Weights   08/09/24 1031  Weight: 148 lb 14.4 oz (67.5 kg)      LABORATORY DATA:  I have reviewed the data as listed    Latest Ref Rng & Units 05/31/2024    9:34 AM 02/15/2024    8:36 AM 10/20/2023    9:09 AM  CMP  Glucose 70 - 99 mg/dL 82  70  86   BUN 8 - 23 mg/dL 14  14  21    Creatinine 0.44 - 1.00 mg/dL 9.06  9.03  9.13   Sodium 135 - 145 mmol/L 139  139  139   Potassium 3.5 - 5.1 mmol/L 4.4  4.1  4.2   Chloride 98 - 111 mmol/L 105  106  106   CO2 22 - 32 mmol/L 28  30  27    Calcium 8.9 - 10.3 mg/dL 9.1  9.3  9.7   Total Protein 6.5 - 8.1 g/dL 6.8  6.9  6.6   Total Bilirubin 0.0 - 1.2 mg/dL 0.4  0.3  0.6   Alkaline Phos 38 - 126 U/L 53  79  114   AST 15 - 41 U/L 24  16  17    ALT 0 - 44 U/L 19  13  11      Lab Results  Component Value Date   WBC 9.1 08/09/2024   HGB 10.9 (L) 08/09/2024   HCT 32.5 (L) 08/09/2024   MCV 95.3 08/09/2024   PLT 579 (H) 08/09/2024   NEUTROABS 7.3 08/09/2024    ASSESSMENT & PLAN:  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female, estrogen receptor positive (HCC) 10/14/2022:Right lumpectomy: 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  No distant metastatic disease. Bone scan 10/29/2022: No evidence of bone metastasis   11/08/2022 and 11/18/2022: Margin reexcision surgeries: Final margin focal positivity.  After much discussion the plan is to finish her systemic treatment and come back at a  later point to do mastectomy.   Treatment summary: Adjuvant chemotherapy with dose dense Adriamycin  and Cytoxan  followed by Taxol  to be completed 05/02/2023, Keytruda  05/25/2023-08/17/2023 (discontinued due to immune mediated adverse effects: Skin rash) Mastectomy by Dr. Ebbie 05/03/2023:Right mastectomy: 8 cm grade 3 IDC with extensive lymphatic and vascular invasion RCB class III, deep margin multifocally positive, 5/5 lymph nodes positive, ER weak 10%, PR 0%, HER2 0, Ki-67 30%, total lymph nodes: 10/10 (prior lumpectomy she had 5 positive nodes) Adjuvant radiation  to start 06/09/2023-07/21/23 Capecitabine  started 05/25/2023-11/25/2023 ------------------------------------------------------------------------------------------------------------------------------------------------ Guardant reveal 12/14/2023: CT DNA detected 2.7% 02/16/2024: CT CAP: Interval enlargement of left superior mediastinal lymph node 2 x 1.8 cm concerning for metastatic disease   Right axillary lymph node biopsy: Positive for metastatic ductal carcinoma ER 0%, PR 0%, HER2 0, Ki-67 50% Guardant 360 12/26/2023: T p53 present, TMB 5.69, MSI high not detected   07/30/2024: CT CAP: New irregular soft tissue mass anterior mediastinum, increased in size and number of lower cervical, mediastinal, left hilar and left axillary nodes.  Large infiltrating mass right latissimus dorsi muscle which is up to intercostal muscles, right upper lobe lymphangitic carcinomatosis changes, no bone metastasis   Shortness of breath: Status post thoracentesis, 1.1 L removed on 08/01/2024, although she felt better she continues to feel short of breath and fatigue.  Right chest wall pain: Related to the tumor.  She is currently on oxycodone .  She also takes Tylenol  and ibuprofen as needed.  Current treatment: Enhertu cycle 1 We are starting her treatment at 4.4 mg/m If she is able to tolerate these treatments, and she is responding to the treatment, we  may be able to place a port in the future. 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 Liver function tests pending, but treatment will proceed regardless. - Administered chemotherapy with a middle dose to assess tolerance. - Monitored liver function tests. - Scheduled follow-up appointment next week to assess blood counts.  Malignant pleural effusion, left greater than right Small bilateral pleural effusions, more pronounced on the left. Not significant enough for drainage. - Continue to monitor pleural effusion status.  Atelectasis of right lung Right lung atelectasis with some improvement. Fluid accumulation present but not significant for intervention.  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 Pain management includes Tylenol , oxycodone , and naproxen sodium. - Continue Tylenol  and oxycodone  for pain management. - Use naproxen sodium twice daily as needed. - Monitor for constipation due to oxycodone  use.  Fatigue related to malignancy and anemia Fatigue likely related to anemia, with hemoglobin at 10.9. - Continue to monitor hemoglobin levels.      No orders of the defined types were placed in this encounter.  The patient has a good understanding of the overall plan. she agrees with it. she will call with any problems that may develop before the next visit here.  I personally spent a total of 30 minutes in the care of the patient today including preparing to see the patient, getting/reviewing separately obtained history, performing a medically appropriate exam/evaluation, counseling and educating, placing orders, referring and communicating with other health care  professionals, documenting clinical information in the EHR, independently interpreting results, communicating results, and coordinating care.   Viinay K Harvey Lingo, MD 08/09/24

## 2024-08-09 NOTE — Patient Instructions (Signed)
 [facility] - A DEPT OF [facility]  Discharge Instructions: Thank you for choosing [facility] to provide your oncology and hematology care.   If you have a lab appointment with the Cancer Center, please go directly to the Cancer Center and check in at the registration area.   Wear comfortable clothing and clothing appropriate for easy access to any Portacath or PICC line.   We strive to give you quality time with your provider. You may need to reschedule your appointment if you arrive late (15 or more minutes).  Arriving late affects you and other patients whose appointments are after yours.  Also, if you miss three or more appointments without notifying the office, you may be dismissed from the clinic at the provider's discretion.      For prescription refill requests, have your pharmacy contact our office and allow 72 hours for refills to be completed.    Today you received the following chemotherapy and/or immunotherapy agents Enhertu      To help prevent nausea and vomiting after your treatment, we encourage you to take your nausea medication as directed.  BELOW ARE SYMPTOMS THAT SHOULD BE REPORTED IMMEDIATELY: *FEVER GREATER THAN 100.4 F (38 C) OR HIGHER *CHILLS OR SWEATING *NAUSEA AND VOMITING THAT IS NOT CONTROLLED WITH YOUR NAUSEA MEDICATION *UNUSUAL SHORTNESS OF BREATH *UNUSUAL BRUISING OR BLEEDING *URINARY PROBLEMS (pain or burning when urinating, or frequent urination) *BOWEL PROBLEMS (unusual diarrhea, constipation, pain near the anus) TENDERNESS IN MOUTH AND THROAT WITH OR WITHOUT PRESENCE OF ULCERS (sore throat, sores in mouth, or a toothache) UNUSUAL RASH, SWELLING OR PAIN  UNUSUAL VAGINAL DISCHARGE OR ITCHING   Items with * indicate a potential emergency and should be followed up as soon as possible or go to the Emergency Department if any problems should occur.  Please show the CHEMOTHERAPY ALERT CARD or IMMUNOTHERAPY  ALERT CARD at check-in to the Emergency Department and triage nurse.  Should you have questions after your visit or need to cancel or reschedule your appointment, please contact [facility] - [facility]  Dept: [phone]  and follow the prompts.  Office hours are 8:00 a.m. to 4:30 p.m. Monday - Friday. Please note that voicemails left after 4:00 p.m. may not be returned until the following business day.  We are closed weekends and major holidays. You have access to a nurse at all times for urgent questions. Please call the main number to the clinic Dept: [phone] and follow the prompts.   For any non-urgent questions, you may also contact your provider using MyChart. We now offer e-Visits for anyone 58 and older to request care online for non-urgent symptoms. For details visit [email or web address].   Also download the MyChart app! Go to the app store, search "MyChart", open the app, select [facility], and log in with your MyChart username and password.  Fam-Trastuzumab Deruxtecan Injection What is this medication? FAM-TRASTUZUMAB DERUXTECAN (fam-tras TOOZ eu mab DER ux TEE kan) treats some types of cancer. It works by blocking a protein that causes cancer cells to grow and multiply. This helps to slow or stop the spread of cancer cells. This medicine may be used for other purposes; ask your health care provider or pharmacist if you have questions. COMMON BRAND NAME(S): ENHERTU What should I tell my care team before I take this medication? They need to know if you have any of these  conditions: Heart disease Heart failure Infection, especially a viral infection, such as chickenpox, cold sores, or herpes Liver disease Lung or breathing disease, such as asthma or COPD An unusual or allergic reaction to fam-trastuzumab deruxtecan, other medications, foods, dyes, or preservatives Pregnant or trying to get pregnant Breast-feeding How should I use  this medication? This medication is injected into a vein. It is given by your care team in a hospital or clinic setting. A special MedGuide will be given to you before each treatment. Be sure to read this information carefully each time. Talk to your care team about the use of this medication in children. Special care may be needed. Overdosage: If you think you have taken too much of this medicine contact a poison control center or emergency room at once. NOTE: This medicine is only for you. Do not share this medicine with others. What if I miss a dose? It is important not to miss your dose. Call your care team if you are unable to keep an appointment. What may interact with this medication? Interactions are not expected. This list may not describe all possible interactions. Give your health care provider a list of all the medicines, herbs, non-prescription drugs, or dietary supplements you use. Also tell them if you smoke, drink alcohol, or use illegal drugs. Some items may interact with your medicine. What should I watch for while using this medication? Visit your care team for regular checks on your progress. Tell your care team if your symptoms do not start to get better or if they get worse. This medication may increase your risk of getting an infection. Call your care team for advice if you get a fever, chills, sore throat, or other symptoms of a cold or flu. Do not treat yourself. Try to avoid being around people who are sick. Avoid taking medications that contain aspirin, acetaminophen, ibuprofen, naproxen, or ketoprofen unless instructed by your care team. These medications may hide a fever. Be careful brushing or flossing your teeth or using a toothpick because you may get an infection or bleed more easily. If you have any dental work done, tell your dentist you are receiving this medication. This medication may cause dry eyes and blurred vision. If you wear contact lenses, you may feel  some discomfort. Lubricating eye drops may help. See your care team if the problem does not go away or is severe. Talk to your care team if you may be pregnant. Serious birth defects can occur if you take this medication during pregnancy and for 7 months after the last dose. If your partner can get pregnant, use a condom during sex while taking this medication and for 4 months after the last dose. Do not breastfeed while taking this medication and for 7 months after the last dose. This medication may cause infertility. Talk to your care team if you are concerned about your fertility. What side effects may I notice from receiving this medication? Side effects that you should report to your care team as soon as possible: Allergic reactions--skin rash, itching, hives, swelling of the face, lips, tongue, or throat Dry cough, shortness of breath or trouble breathing Infection--fever, chills, cough, sore throat, wounds that don't heal, pain or trouble when passing urine, general feeling of discomfort or being unwell Heart failure--shortness of breath, swelling of the ankles, feet, or hands, sudden weight gain, unusual weakness or fatigue Unusual bruising or bleeding Side effects that usually do not require medical attention (report these to your care  team if they continue or are bothersome): Constipation Diarrhea Hair loss Muscle pain Nausea Vomiting This list may not describe all possible side effects. Call your doctor for medical advice about side effects. You may report side effects to FDA at 1-800-FDA-1088. Where should I keep my medication? This medication is given in a hospital or clinic. It will not be stored at home. NOTE: This sheet is a summary. It may not cover all possible information. If you have questions about this medicine, talk to your doctor, pharmacist, or health care provider.  2024 Elsevier/Gold Standard (2023-05-20 00:00:00)

## 2024-08-10 ENCOUNTER — Ambulatory Visit: Payer: Self-pay | Admitting: *Deleted

## 2024-08-14 ENCOUNTER — Other Ambulatory Visit: Payer: Self-pay

## 2024-08-14 DIAGNOSIS — C50411 Malignant neoplasm of upper-outer quadrant of right female breast: Secondary | ICD-10-CM

## 2024-08-14 MED ORDER — PROCHLORPERAZINE MALEATE 10 MG PO TABS: 10.0000 mg | ORAL_TABLET | Freq: Four times a day (QID) | ORAL | 1 refills | Status: DC | PRN

## 2024-08-15 ENCOUNTER — Ambulatory Visit: Attending: General Surgery

## 2024-08-15 DIAGNOSIS — Z170 Estrogen receptor positive status [ER+]: Secondary | ICD-10-CM | POA: Diagnosis not present

## 2024-08-15 DIAGNOSIS — Z483 Aftercare following surgery for neoplasm: Secondary | ICD-10-CM | POA: Insufficient documentation

## 2024-08-15 DIAGNOSIS — C50411 Malignant neoplasm of upper-outer quadrant of right female breast: Secondary | ICD-10-CM | POA: Insufficient documentation

## 2024-08-15 DIAGNOSIS — M25611 Stiffness of right shoulder, not elsewhere classified: Secondary | ICD-10-CM | POA: Diagnosis not present

## 2024-08-15 DIAGNOSIS — R293 Abnormal posture: Secondary | ICD-10-CM | POA: Insufficient documentation

## 2024-08-15 NOTE — Therapy (Signed)
 OUTPATIENT PHYSICAL THERAPY  UPPER EXTREMITY ONCOLOGY TREATMENT  Patient Name: Caitlin [name] MRN: [id number] DOB:August 27, 1956, 68 y.o., female Today's Date: 08/15/2024  END OF SESSION:  PT End of Session - 08/15/24 1207     Visit Number 6    Number of Visits 14    Date for Recertification  09/12/24    PT Start Time 1204    PT Stop Time 1240   pt had to leave early for another appt   PT Time Calculation (min) 36 min    Activity Tolerance Patient tolerated treatment well;Treatment limited secondary to medical complications (Comment)    Behavior During Therapy [facility] for tasks assessed/performed             Past Medical History:  Diagnosis Date   Asthma    Breast cancer (HCC)    right   Complication of anesthesia    Dyspnea    Dysrhythmia    2012   Headache    History of kidney stones    passed   Hypothyroidism    Pneumonia 01/26/2023   PONV (postoperative nausea and vomiting)    rt breast ca 08/2022   Right Breast   Scoliosis    Thyroid  disease    Past Surgical History:  Procedure Laterality Date   APPENDECTOMY     AXILLARY SENTINEL NODE BIOPSY Right 10/14/2022   Procedure: RIGHT AXILLARY SENTINEL NODE BIOPSY;  Surgeon: Ebbie Cough, MD;  Location: [facility] OR;  Service: General;  Laterality: Right;   BREAST BIOPSY Right 09/15/2022   US  RT BREAST BX W LOC DEV 1ST LESION IMG BX SPEC US  GUIDE 09/15/2022 [facility]   BREAST BIOPSY  10/12/2022   US  RT RADIOACTIVE SEED LOC 10/12/2022 [facility]MO[facility]   BREAST LUMPECTOMY WITH RADIOACTIVE SEED AND SENTINEL LYMPH NODE BIOPSY Right 10/14/2022   Procedure: RIGHT BREAST LUMPECTOMY WITH RADIOACTIVE SEED;  Surgeon: Ebbie Cough, MD;  Location: [facility] OR;  Service: General;  Laterality: Right;   COLONOSCOPY     LAPAROSCOPIC APPENDECTOMY     MASTECTOMY Right    PORT-A-CATH REMOVAL Left 05/03/2023   Procedure: REMOVAL PORT-A-CATH;  Surgeon: Ebbie Cough, MD;  Location: [facility] OR;  Service: General;  Laterality:  Left;   PORT-A-CATH REMOVAL Left 12/22/2023   Procedure: REMOVAL PORT-A-CATH;  Surgeon: Ebbie Cough, MD;  Location: [facility];  Service: General;  Laterality: Left;   PORTACATH PLACEMENT Left 10/14/2022   Procedure: INSERTION PORT-A-CATH;  Surgeon: Ebbie Cough, MD;  Location: [facility] OR;  Service: General;  Laterality: Left;   PORTACATH PLACEMENT Left 05/19/2023   Procedure: PORT PLACEMENT WITH ULTRASOUND GUIDANCE;  Surgeon: Ebbie Cough, MD;  Location: [facility];  Service: General;  Laterality: Left;   RE-EXCISION OF BREAST LUMPECTOMY Right 11/08/2022   Procedure: RE-EXCISION OF RIGHT BREAST LUMPECTOMY;  Surgeon: Ebbie Cough, MD;  Location: [facility];  Service: General;  Laterality: Right;   RE-EXCISION OF BREAST LUMPECTOMY Right 11/18/2022   Procedure: RE-EXCISION OF RIGHT BREAST LUMPECTOMY;  Surgeon: Ebbie Cough, MD;  Location: [facility] OR;  Service: General;  Laterality: Right;   SIMPLE MASTECTOMY WITH AXILLARY SENTINEL NODE BIOPSY Right 05/03/2023   Procedure: RIGHT MASTECTOMY;  Surgeon: Ebbie Cough, MD;  Location: [facility] OR;  Service: General;  Laterality: Right;  GEN & PEC BLOCK   WISDOM TOOTH EXTRACTION     Patient Active Problem List   Diagnosis Date Noted   S/P mastectomy, right 05/03/2023   Port-A-Cath in place 12/16/2022   Genetic testing 10/11/2022  Appendicitis with peritonitis 09/29/2022   Family history of prostate cancer 09/29/2022   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female, estrogen receptor positive (HCC) 09/22/2022   Atrial fibrillation with RVR (HCC) 11/30/2010   Chronic anticoagulation 11/30/2010   Hypothyroidism 11/30/2010   PALPITATIONS 11/27/2010    PCP: Jerel Sieving MD  REFERRING PROVIDER: Lynwood Nasuti, MD  REFERRING DIAG: [phone] (ICD-10-CM) - 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female, estrogen receptor positive  (HCC)  THERAPY DIAG:  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female, estrogen receptor positive (HCC)  Aftercare following surgery for neoplasm  Abnormal posture  Stiffness of right shoulder, not elsewhere classified  ONSET DATE: 05/03/23  Rationale for Evaluation and Treatment: Rehabilitation  SUBJECTIVE:                                                                                                                                                                                           SUBJECTIVE STATEMENT: I need to leave by 1240 today for an appt at 1. I had my first Enhertu last Thursday and it made me feel bad almost immediately. I have been fighting fatigue, general malaise and nauseous since.  My Rt shoulder doesn't hurt much until I try to lift it, then I feel it pulling really bad at the new area on my Rt trunk that I recently had biopsied.   PERTINENT HISTORY:   10/14/22:   Rt lumpectomy and SLNB with 5/5 nodes positive 11/08/22:   Re excision of Rt lumpectomy 11/18/22: Re excision of Rt lumpectomy  2/29/4: Chemotherapy ACP 05/03/23 Rt mastectomy with positive margins 5/5 nodes, 07/21/23: completed radiation 05/22/24: disease progression with new low left jugular and mediastinal adenopathy. No bone METS 07/30/24: CT CAP: New irregular soft tissue mass anterior mediastinum, increased in size and number of lower cervical, mediastinal, left hilar and left axillary nodes. Large infiltrating mass right latissimus dorsi muscle which is up to intercostal muscles, right upper lobe lymphangitic carcinomatosis changes, no bone metastasis    PAIN:  PAIN:  Are you having pain? Yes: NPRS scale: 2/10 Pain location: Rt axilla  Pain description: ender at the axilla Aggravating factors: Gets worse as the day goes on and pulls really bad when I reach into the axilla to trunk Relieving factors: Tylenol , MLD    PRECAUTIONS: Other: at risk of lymphedema  RED  FLAGS: None   WEIGHT BEARING RESTRICTIONS: No  FALLS:  Has patient fallen in last 6 months? No  LIVING ENVIRONMENT:/ Lives with: lives with their family Lives in: House/apartment Has following equipment at home: None  OCCUPATION: retired  LEISURE: walks from house to barn to feed her horses, had been doing a full body exercise program every day up until a few weeks ago - discontinued due to shoulder/axilla pain   HAND DOMINANCE: right   PRIOR LEVEL OF FUNCTION: Independent  PATIENT GOALS: to get more range of motion of R shoulder   OBJECTIVE:  COGNITION: Overall cognitive status: Within functional limits for tasks assessed   PALPATION: Several cords palpable in R axilla extending to scar and down upper arm  OBSERVATIONS / OTHER ASSESSMENTS: steri strips still in place over healing mastectomy scar, cording visible in R axilla with abduction  POSTURE: rounded shoulders, forward head  UPPER EXTREMITY AROM/PROM:  A/PROM RIGHT   eval  RIGHT 07/12/24 Right 08/15/24   Shoulder extension 77 23 62  Shoulder flexion 110 93 85  Shoulder abduction 116 87 83  Shoulder internal rotation 75    Shoulder external rotation 57      (Blank rows = not tested)  A/PROM LEFT   eval  Shoulder extension 67  Shoulder flexion 157  Shoulder abduction 158  Shoulder internal rotation 62  Shoulder external rotation 83    (Blank rows = not tested)   UPPER EXTREMITY STRENGTH:   LYMPHEDEMA ASSESSMENTS:  SURGERY TYPE/DATE: R mastectomy and SLNB on 10/14/22 followed by chemo and then radiation.  10/14/22: R breast lumpectomy 5/5, 11/08/22: Re excision of R lumpectomy, 11/18/22 - Re excision of R lumpectomy,completed neoadjuvant, 05/03/23 R mastectomy with positive margins 5/5 nodes, NUMBER OF LYMPH NODES REMOVED: see above CHEMOTHERAPY: completed neoadjuvant RADIATION:beginning 06/08/23  LYMPHEDEMA ASSESSMENTS:   LANDMARK RIGHT  eval RIGHT 07/12/24  At axilla  33 31.5  15 cm proximal to  olecranon process 29.5 30.4  10 cm proximal to olecranon process 27 28.2  Olecranon process 23.5 23.9  15 cm proximal to ulnar styloid process 23.1 23.1  10 cm proximal to ulnar styloid process 20 19.8  Just proximal to ulnar styloid process 15.6 15.6  Across hand at thumb web space 20.9 19.3  At base of 2nd digit 6.5 6.5  (Blank rows = not tested)  Total volume at initial evaluation: 389.65 cm Total volume at 07/12/24 evaluation: 387.27 cm Volume change: ?2.38 cm Percentage change: ?0.61% From copilot   LANDMARK LEFT  eval LEFT  At axilla  32.5 31.5  15 cm proximal to olecranon process 29.1 28.4  10 cm proximal to olecranon process 27.5 27.6  Olecranon process 23.9 23.4  15 cm proximal to ulnar styloid process 22.7 22.9  10 cm proximal to ulnar styloid process 19.7 19.8  Just proximal to ulnar styloid process 15.2 15.4  Across hand at thumb web space 19 19.8  At base of 2nd digit 6.2 6.3  (Blank rows = not tested)  Initial volume: 379.14 cm Volume on 07/12/24: 373.49 cm Volume change: ?5.64 cm Percentage change: ?1.49% From copilot   QUICK DASH SURVEY:   Junie Palin - 08/15/24 0001     Open a tight or new jar Moderate difficulty    Do heavy household chores (wash walls, wash floors) Moderate difficulty    Carry a shopping bag or briefcase Severe difficulty    Wash your back Moderate difficulty    Use a knife to cut food Moderate difficulty    Recreational activities in which you take some force or impact through your arm, shoulder, or hand (golf, hammering, tennis) Moderate difficulty    During the past week, to what extent has your arm, shoulder or hand  problem interfered with your normal social activities with family, friends, neighbors, or groups? Modererately    During the past week, to what extent has your arm, shoulder or hand problem limited your work or other regular daily activities Modererately    Arm, shoulder, or hand pain. Mild    Tingling (pins and  needles) in your arm, shoulder, or hand None    Difficulty Sleeping Mild difficulty    DASH Score 43.18 %         07/12/24 - 40.91%   TODAY'S TREATMENT:                                                                                                                                          DATE:  08/15/24: Manual Therapy MLD to Rt UE as follows: Short neck, 5 diaphragmatic breaths, Rt inguinal nodes and Rt axillo-inguinal anastomosis (very light along anastomosis due to progression of disease near skin level at Rt lateral trunk), then Rt UE working from proximal to distal (lateral upper arm, medial to lateral and lateral again, ant/post forearm, and then dorsal wrist/hand) and then retracing all steps back to LN's. P/ROM very gently to Rt shoulder into flexion and er mostly, a few times into scaption and D2 but pt reports increased pain/pulling at axilla into new area of growth at lateral trunk so D/C these 2 motions and focused on what was tolerable for pt, gentle scapular depression by therapist throughout to allow for improved comfort during stretching.   07/25/24 Examined redness that was visible through the arm whole of pt's shirt near the biopsy site.   It appears to be purple/red at the lateral trunk and chest and towards the sternum at the tumor site which appears to have some progression which pt is also aware of and reports she thinks it is just from the tumor.  Discussed POC: pt is ind with self MLD and compression and we will switch to maintaining Rom and functional movement and treat pain and swelling as needed.   Pulleys into flexion and scaption x 2-3 min each with rest break between Wall ball flexion x 5 Supine dowel chest press x 10 Supine dowel flexion x 5 Palms up extension presses 5 x 10 with cueing to include the shoulder Assessed breathing which incorporated diaphragmatic breathing well without cueing PROM into flexion, abduction, and ER to tolerance  MLD of the arm  only  07/23/24 Therapeutic Exercise Pulleys into flexion and scaption x each for improved reach on the Rt arm Wall ball flexion x 5  Manual Therapy MLD to Rt UE Short neck, 5 resisted diaphragmatic breaths, Lt axillary and pectoral nodes, anterior inter-axillary anastomosis, Rt inguinal nodes, Rt axillo-inguinals anastomosis, then Rt UE working from proximal to distal (lateral upper arm, medial to lateral and lateral again, anterior and posterior forearm, and dorsal wrist/hand and fingers) retracing all steps ending with LN's.  P/ROM to Rt shoulder into flex, abd and D2 with gentle scapular depression throughout,  STM to Rt pect insertion where pt palpably tight     PATIENT EDUCATION:  Education details: POC, HEP, importance of wearing compression sleeve for at least 10-12 hours per day Person educated: Patient Education method: Explanation, Demonstration, and Handouts Education comprehension: verbalized understanding and returned demonstration  HOME EXERCISE PROGRAM: Access Code: [email or web address] URL: [email or web address] Date: 07/12/2024 Prepared by: Delon Pack  Exercises - Seated Scapular Retraction  - 1 x daily - 7 x weekly - 3 sets - 10 reps - Seated Shoulder Flexion Towel Slide at Table Top  - 1 x daily - 7 x weekly - 3 sets - 10 reps - Seated Shoulder Scaption Slide at Table Top with Forearm in Neutral  - 1 x daily - 7 x weekly - 3 sets - 10 reps - Supine Shoulder Flexion AAROM with Hands Clasped  - 1 x daily - 7 x weekly - 3 sets - 10 reps  ASSESSMENT:  CLINICAL IMPRESSION: Shortened session today as pt had another appt immediately following this visit. Today focused on MLD and P/ROM to Rt UE as pt was very fatigued from her first chemotherapy last week and not up to exercising in the gym. Physical therapy has been helpful in maintaining current functional status with her most recent disease progression of new tumor at her Rt lateral trunk. Her A/ROM was  slightly less today from last time measured due to increased pain with reach at new tumor site. Possibility is present that if pt stops physical therapy at this time her function may decline so it is beneficial for her to cont physical therapy to help pt maintain current function as able, especially as she has just begun a new chemotherapy that is also affecting her ADLs.   OBJECTIVE IMPAIRMENTS: decreased ROM, decreased strength, hypomobility, increased fascial restrictions, impaired UE functional use, postural dysfunction, and pain.   ACTIVITY LIMITATIONS: carrying, lifting, bathing, reach over head, and hygiene/grooming  PARTICIPATION LIMITATIONS: meal prep, cleaning, laundry, yard work, and difficulty with feeding and giving her horses water  PERSONAL FACTORS: 1 comorbidity: previous history of R lumpectomy are also affecting patient's functional outcome.   REHAB POTENTIAL: Good  CLINICAL DECISION MAKING: Stable/uncomplicated  EVALUATION COMPLEXITY: Low  GOALS: Goals reviewed with patient? Yes  SHORT TERM GOALS=LONG TERM GOALS Target date: 08/09/24  Pt will improve shoulder AROM in all directions for improved functional mobility to at least baseline  Baseline:  Goal status: ONGOING - see above  2.  Pt will demonstrate an improvement in Quick-DASH score from 40.91 to <10 in order to return to baseline function  Baseline: 40.91 ; 08/15/24 - 43.18 Goal status: ONGOING  3.  Pt will compliant with wearing her compression sleeve to decrease swelling and return SOZO score back to baseline  Baseline: 08/15/24 - pt wears this daily now Goal status: MET  4.  Pt will be independent in a home exercise program for continued strengthening and stretching.  Baseline:  Goal status: ONGOING  PLAN:  PT FREQUENCY: 2x/week  PT DURATION: 4 weeks  PLANNED INTERVENTIONS: Therapeutic exercises, Therapeutic activity, Patient/Family education, Self Care, Orthotic/Fit training, Manual lymph  drainage, scar mobilization, Taping, Vasopneumatic device, and Manual therapy  PLAN FOR NEXT SESSION: Renewal done this session. Cont and review Rt UE MLD, STM to Rt upper quadrant PRN, (avoid Rt lateral trunk with active tumor) PROM to R shoulder, pulleys, ball as pt tolerates   Berwyn Knights, PTA  08/15/24 1:04 PM

## 2024-08-16 ENCOUNTER — Other Ambulatory Visit: Payer: Self-pay | Admitting: *Deleted

## 2024-08-16 DIAGNOSIS — Z170 Estrogen receptor positive status [ER+]: Secondary | ICD-10-CM

## 2024-08-17 ENCOUNTER — Encounter (HOSPITAL_COMMUNITY)

## 2024-08-17 ENCOUNTER — Other Ambulatory Visit (HOSPITAL_COMMUNITY)

## 2024-08-20 ENCOUNTER — Inpatient Hospital Stay

## 2024-08-20 ENCOUNTER — Inpatient Hospital Stay: Admitting: Hematology and Oncology

## 2024-08-20 VITALS — BP 120/74 | HR 85 | Temp 98.0°F | Resp 17 | Wt 134.5 lb

## 2024-08-20 DIAGNOSIS — C50411 Malignant neoplasm of upper-outer quadrant of right female breast: Secondary | ICD-10-CM

## 2024-08-20 DIAGNOSIS — Z5112 Encounter for antineoplastic immunotherapy: Secondary | ICD-10-CM | POA: Diagnosis not present

## 2024-08-20 DIAGNOSIS — Z170 Estrogen receptor positive status [ER+]: Secondary | ICD-10-CM

## 2024-08-20 LAB — CBC WITH DIFFERENTIAL (CANCER CENTER ONLY)
Abs Immature Granulocytes: 0.07 K/uL (ref 0.00–0.07)
Basophils Absolute: 0.1 K/uL (ref 0.0–0.1)
Basophils Relative: 2 %
Eosinophils Absolute: 0.2 K/uL (ref 0.0–0.5)
Eosinophils Relative: 5 %
HCT: 33.6 % — ABNORMAL LOW (ref 36.0–46.0)
Hemoglobin: 11.2 g/dL — ABNORMAL LOW (ref 12.0–15.0)
Immature Granulocytes: 2 %
Lymphocytes Relative: 13 %
Lymphs Abs: 0.5 K/uL — ABNORMAL LOW (ref 0.7–4.0)
MCH: 32 pg (ref 26.0–34.0)
MCHC: 33.3 g/dL (ref 30.0–36.0)
MCV: 96 fL (ref 80.0–100.0)
Monocytes Absolute: 0.4 K/uL (ref 0.1–1.0)
Monocytes Relative: 9 %
Neutro Abs: 3 K/uL (ref 1.7–7.7)
Neutrophils Relative %: 69 %
Platelet Count: 440 K/uL — ABNORMAL HIGH (ref 150–400)
RBC: 3.5 MIL/uL — ABNORMAL LOW (ref 3.87–5.11)
RDW: 13.4 % (ref 11.5–15.5)
WBC Count: 4.2 K/uL (ref 4.0–10.5)
nRBC: 0 % (ref 0.0–0.2)

## 2024-08-20 LAB — CMP (CANCER CENTER ONLY)
ALT: 13 U/L (ref 0–44)
AST: 22 U/L (ref 15–41)
Albumin: 3.6 g/dL (ref 3.5–5.0)
Alkaline Phosphatase: 65 U/L (ref 38–126)
Anion gap: 10 (ref 5–15)
BUN: 22 mg/dL (ref 8–23)
CO2: 24 mmol/L (ref 22–32)
Calcium: 8.6 mg/dL — ABNORMAL LOW (ref 8.9–10.3)
Chloride: 103 mmol/L (ref 98–111)
Creatinine: 0.93 mg/dL (ref 0.44–1.00)
GFR, Estimated: >60 mL/min (ref >60)
Glucose, Bld: 82 mg/dL (ref 70–99)
Potassium: 3.9 mmol/L (ref 3.5–5.1)
Sodium: 137 mmol/L (ref 135–145)
Total Bilirubin: 0.3 mg/dL (ref 0.0–1.2)
Total Protein: 6.6 g/dL (ref 6.5–8.1)

## 2024-08-20 NOTE — Progress Notes (Signed)
 Patient Care Team: Toribio Jerel MATSU, MD as PCP - General (Unknown Physician Specialty) Tyree Nanetta SAILOR, RN as Oncology Nurse Navigator Odean Potts, MD as Consulting Physician (Hematology and Oncology) Shannon Agent, MD as Consulting Physician (Radiation Oncology) Ebbie Cough, MD as Consulting Physician (General Surgery)  DIAGNOSIS:  Encounter Diagnosis  Name Primary?   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female, estrogen receptor positive (HCC) Yes    SUMMARY OF ONCOLOGIC HISTORY: Oncology History  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female, estrogen receptor positive (HCC)  09/15/2022 Initial Diagnosis   Screening mammogram detected right breast mass which measured 1.4 cm by ultrasound at 10 o'clock position, biopsy revealed grade 3 IDC with lymphovascular invasion ER 10%, PR 0%, Ki-67 30%, HER2 0 negative   09/25/2022 Cancer Staging   Staging form: Breast, AJCC 8th Edition - Clinical: Stage IB (cT1c, cN0, cM0, G3, ER+, PR-, HER2-) - Signed by Odean Potts, MD on 09/25/2022 Histologic grading system: 3 grade system    Genetic Testing   [facility] CancerNext-Expanded Panel+RNA was Negative. Report date is 10/06/2021.  The CancerNext-Expanded gene panel offered by [facility] and includes sequencing, rearrangement, and RNA analysis for the following 77 genes: AIP, ALK, APC, ATM, AXIN2, BAP1, BARD1, BLM, BMPR1A, BRCA1, BRCA2, BRIP1, CDC73, CDH1, CDK4, CDKN1B, CDKN2A, CHEK2, CTNNA1, DICER1, FANCC, FH, FLCN, GALNT12, KIF1B, LZTR1, MAX, MEN1, MET, MLH1, MSH2, MSH3, MSH6, MUTYH, NBN, NF1, NF2, NTHL1, PALB2, PHOX2B, PMS2, POT1, PRKAR1A, PTCH1, PTEN, RAD51C, RAD51D, RB1, RECQL, RET, SDHA, SDHAF2, SDHB, SDHC, SDHD, SMAD4, SMARCA4, SMARCB1, SMARCE1, STK11, SUFU, TMEM127, TP53, TSC1, TSC2, VHL and XRCC2 (sequencing and deletion/duplication); EGFR, EGLN1, HOXB13, KIT, MITF, PDGFRA, POLD1, and POLE (sequencing only); EPCAM and GREM1  (deletion/duplication only).    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 with high-grade DCIS, inferior margin positive, angiolymphatic invasion present, 5/5 lymph nodes positive, additional medial margin: Poorly differentiated carcinoma with lymphoid stroma, additional inferior margin: Positive, ER 10% weak, PR 0%, HER2 0, Ki-67 30%   10/21/2022 Cancer Staging   Staging form: Breast, AJCC 8th Edition - Pathologic: Stage IIIC (pT1c, pN2a, cM0, G3, ER-, PR-, HER2-) - Signed by Odean Potts, MD on 10/21/2022 Stage prefix: Initial diagnosis Histologic grading system: 3 grade system   11/08/2022 Surgery   Margin reexcision: Inferior margin: Grade 3 IDC, new margin positive Medial margin: Grade 3 IDC new margin widely involved   11/18/2022 Surgery   Right inferior margin reexcision: Invasive poorly differentiated adenocarcinoma grade 3 present in new inferior margin.  Medial margin reexcision: Invasive poorly differentiated adenocarcinoma grade 3 present at new medial margin, posterior margin: Poorly differentiated adenocarcinoma grade 3, margin negative  11/08/2022 and 11/18/2022: Final margin focal positivity. After much discussion the plan is to finish her systemic treatment and come back at a later point to do mastectomy.    12/02/2022 - 04/18/2023 Chemotherapy   Patient is on Treatment Plan : BREAST ADJUVANT DOSE DENSE AC q14d / PACLitaxel  q7d     05/03/2023 Surgery   Right mastectomy: 8 cm grade 3 IDC with extensive lymphatic and vascular invasion RCB class III, deep margin multifocally positive, 5/5 lymph nodes positive, ER weak 10%, PR 0%, HER2 0, Ki-67 30%   05/25/2023 - 08/17/2023 Chemotherapy   Patient is on Treatment Plan : BREAST Pembrolizumab  (200) q21d x 24 months     05/25/2023 -  Adjuvant Chemotherapy   Capectiabine 1000mg  po BID 2 weeks  on, 1 week off; began 05/25/2023, second cycle delayed to  07/06/2023   06/08/2023 - 07/21/2023 Radiation Therapy   First Treatment Date: 2023-06-08 - Last Treatment Date: 2023-07-21   Plan Name: CW_R_BO Site: Chest Wall, Right Technique: 3D Mode: Photon Dose Per Fraction: 2 Gy Prescribed Dose (Delivered / Prescribed): 26 Gy / 26 Gy Prescribed Fxs (Delivered / Prescribed): 13 / 13   Plan Name: CW_R_SCV_PAB Site: Chest Wall, Right Technique: 3D Mode: Photon Dose Per Fraction: 2 Gy Prescribed Dose (Delivered / Prescribed): 50 Gy / 50 Gy Prescribed Fxs (Delivered / Prescribed): 25 / 25   Plan Name: CW_R_Bst_BO Site: Chest Wall, Right Technique: Electron Mode: Electron Dose Per Fraction: 2 Gy Prescribed Dose (Delivered / Prescribed): 14 Gy / 14 Gy Prescribed Fxs (Delivered / Prescribed): 7 / 7   Plan Name: CW_R Site: Chest Wall, Right Technique: 3D Mode: Photon Dose Per Fraction: 2 Gy Prescribed Dose (Delivered / Prescribed): 24 Gy / 24 Gy Prescribed Fxs (Delivered / Prescribed): 12 / 12   08/09/2024 -  Chemotherapy   Patient is on Treatment Plan : BREAST Fam-Trastuzumab Deruxtecan-nxki (Enhertu) (5.4) q21d       CHIEF COMPLIANT: Cycle 1 day 10 Enhertu  HISTORY OF PRESENT ILLNESS:  History of Present Illness Caitlin [name] is a 68 year old female with metastatic breast cancer who presents with side effects from chemotherapy.  She experiences significant chemotherapy side effects, including nausea, fatigue, and stomach gurgling. These symptoms were most intense for a week post-treatment and now occur intermittently. Nausea is severe, but there is no vomiting. She uses Compazine  for nausea and has one refill left. She was advised against Zofran  due to its similarity to a premedication. She takes Decadron  and Compazine  as part of her regimen.  Pain and swelling have improved significantly, with pain now rated as 1 out of 10. She has not used oxycodone  for over a week. Swelling in her arm has decreased.  She continues to experience  shortness of breath, particularly with exertion, though it is less severe. She has lost some weight recently. There is no burning sensation in her stomach, but gurgling and noise persist. Fatigue has lessened in the last couple of days.     ALLERGIES:  is allergic to penicillins.  MEDICATIONS:  Current Outpatient Medications  Medication Sig Dispense Refill   albuterol  (VENTOLIN  HFA) 108 (90 Base) MCG/ACT inhaler Inhale 2 puffs into the lungs every 4 (four) hours as needed for shortness of breath (Asthma).     alendronate  (FOSAMAX ) 70 MG tablet Take 1 tablet (70 mg total) by mouth once a week. Take with a full glass of water on an empty stomach. 12 tablet 3   aspirin 81 MG tablet Take 81 mg by mouth daily.       dexamethasone  (DECADRON ) 4 MG tablet Take 1 tablets  by mouth daily for 3 days starting the day after chemotherapy. Take with food. 30 tablet 1   diltiazem  (CARDIZEM  CD) 120 MG 24 hr capsule Take 120 mg by mouth daily.     levothyroxine  (SYNTHROID ) 75 MCG tablet Take 75 mcg by mouth daily before breakfast.     lidocaine -prilocaine  (EMLA ) cream Apply to affected area once 30 g 3   Multiple Vitamins-Minerals (MULTIVITAMIN WITH MINERALS) tablet Take 1 tablet by mouth daily.     ondansetron  (ZOFRAN ) 8 MG tablet Take 1 tablet (8 mg total) by mouth every 8 (eight) hours as needed for nausea or vomiting. Start on the third day after  chemotherapy. 30 tablet 1   OVER THE COUNTER MEDICATION Take 1 capsule by mouth 2 (two) times daily. copaiba softgels     oxyCODONE  (OXY IR/ROXICODONE ) 5 MG immediate release tablet Take 1 tablet (5 mg total) by mouth every 6 (six) hours as needed for severe pain (pain score 7-10). 60 tablet 0   prochlorperazine  (COMPAZINE ) 10 MG tablet Take 1 tablet (10 mg total) by mouth every 6 (six) hours as needed for nausea or vomiting. 30 tablet 1   PULMICORT  FLEXHALER 180 MCG/ACT inhaler Inhale 1 puff into the lungs daily.     No current facility-administered medications  for this visit.    PHYSICAL EXAMINATION: ECOG PERFORMANCE STATUS: 1 - Symptomatic but completely ambulatory  Vitals:   08/20/24 0835  BP: 120/74  Pulse: 85  Resp: 17  Temp: 98 F (36.7 C)  SpO2: 96%   Filed Weights   08/20/24 0835  Weight: 134 lb 8 oz (61 kg)    Physical Exam HEENT: Moderate fluid in the left ear  (exam performed in the presence of a chaperone)  LABORATORY DATA:  I have reviewed the data as listed    Latest Ref Rng & Units 08/09/2024   10:16 AM 05/31/2024    9:34 AM 02/15/2024    8:36 AM  CMP  Glucose 70 - 99 mg/dL 98  82  70   BUN 8 - 23 mg/dL 23  14  14    Creatinine 0.44 - 1.00 mg/dL 9.22  9.06  9.03   Sodium 135 - 145 mmol/L 135  139  139   Potassium 3.5 - 5.1 mmol/L 4.0  4.4  4.1   Chloride 98 - 111 mmol/L 103  105  106   CO2 22 - 32 mmol/L 23  28  30    Calcium 8.9 - 10.3 mg/dL 8.6  9.1  9.3   Total Protein 6.5 - 8.1 g/dL 6.7  6.8  6.9   Total Bilirubin 0.0 - 1.2 mg/dL 0.4  0.4  0.3   Alkaline Phos 38 - 126 U/L 64  53  79   AST 15 - 41 U/L 20  24  16    ALT 0 - 44 U/L 9  19  13      Lab Results  Component Value Date   WBC 4.2 08/20/2024   HGB 11.2 (L) 08/20/2024   HCT 33.6 (L) 08/20/2024   MCV 96.0 08/20/2024   PLT 440 (H) 08/20/2024   NEUTROABS 3.0 08/20/2024    ASSESSMENT & PLAN:  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female, estrogen receptor positive (HCC) 10/14/2022:Right lumpectomy: Grade 3 invasive poorly differentiated ductal adenocarcinoma 1.4 cm with focal lobular features with high-grade DCIS, inferior margin positive, angiolymphatic invasion present, 5/5 lymph nodes positive, additional medial margin: Poorly differentiated carcinoma with lymphoid stroma, additional inferior margin: Positive, ER 10% weak, PR 0%, HER2 0, Ki-67 30% T1c N2a: Stage IIIc pathological staging   CT CAP 10/29/2022: Several prominent lymph nodes and enlarged nodes right axilla and right supraclavicular region many of these are  nonpathologic by size criteria.  No distant metastatic disease. Bone scan 10/29/2022: No evidence of bone metastasis   11/08/2022 and 11/18/2022: Margin reexcision surgeries: Final margin focal positivity.  After much discussion the plan is to finish her systemic treatment and come back at a later point to do mastectomy.   Treatment summary: Adjuvant chemotherapy with dose dense Adriamycin  and Cytoxan  followed by Taxol  to be completed 05/02/2023, Keytruda  05/25/2023-08/17/2023 (discontinued due to immune  mediated adverse effects: Skin rash) Mastectomy by Dr. Ebbie 05/03/2023:Right mastectomy: 8 cm grade 3 IDC with extensive lymphatic and vascular invasion RCB class III, deep margin multifocally positive, 5/5 lymph nodes positive, ER weak 10%, PR 0%, HER2 0, Ki-67 30%, total lymph nodes: 10/10 (prior lumpectomy she had 5 positive nodes) Adjuvant radiation to start 06/09/2023-07/21/23 Capecitabine  started 05/25/2023-11/25/2023 ------------------------------------------------------------------------------------------------------------------------------------------------ Guardant reveal 12/14/2023: CT DNA detected 2.7% 02/16/2024: CT CAP: Interval enlargement of left superior mediastinal lymph node 2 x 1.8 cm concerning for metastatic disease   Right axillary lymph node biopsy: Positive for metastatic ductal carcinoma ER 0%, PR 0%, HER2 0, Ki-67 50% Guardant 360 12/26/2023: T p53 present, TMB 5.69, MSI high not detected   07/30/2024: CT CAP: New irregular soft tissue mass anterior mediastinum, increased in size and number of lower cervical, mediastinal, left hilar and left axillary nodes.  Large infiltrating mass right latissimus dorsi muscle which is up to intercostal muscles, right upper lobe lymphangitic carcinomatosis changes, no bone metastasis   Shortness of breath: Status post thoracentesis, 1.1 L removed on 08/01/2024, although she felt better she continues to feel short of breath and fatigue.   Right  chest wall pain: Related to the tumor.  She is currently on oxycodone .  She also takes Tylenol  and ibuprofen as needed.   Current treatment: Enhertu started 08/09/2024 today is cycle 1 day 10 If she is able to tolerate these treatments, and she is responding to the treatment, we may be able to place a port in the future. Echocardiogram 07/27/2024: EF 60 to 65%  Chemotoxicities: Profound nausea and vomiting that lasted a full week after her chemo. Generalized fatigue and weight loss  She however reports that the breathing is stable although she gets short of breath when on exertion, the pain is resolved.   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 Pain and swelling improved, pain rated 1/10. No oxycodone  use in past week. Shortness of breath persists but less severe. Moderate fluid in left ear, no immediate intervention needed. - Continue current chemotherapy regimen, consider dose adjustment if nausea worsens. - Next chemotherapy session scheduled for December 23rd, 2025. - Monitor for infection signs due to white blood cell and neutrophil levels.  Malignant pleural effusion, left greater than right Moderate fluid in left ear, no immediate intervention needed. - Monitor pleural effusion, reassess if symptoms worsen.  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 No oxycodone  use in past week. - Continue current pain management regimen as needed.  Fatigue related to malignancy and anemia Fatigue persists but less severe. Hemoglobin improved from 10.9 to 11.2. - Continue monitoring hemoglobin levels, manage anemia as needed.  Chemotherapy-induced nausea Significant nausea post-chemotherapy, decreased recently. Current antiemetic regimen includes Compazine  and  Zofran , with frequent Compazine  use. - Continue current antiemetic regimen with Compazine  and Zofran  as needed. - Consider prophylactic antiemetics every 6-7 hours post-chemotherapy. - Monitor nausea severity, adjust antiemetic regimen as needed.      No orders of the defined types were placed in this encounter.  The patient has a good understanding of the overall plan. she agrees with it. she will call with any problems that may develop before the next visit here.  I personally spent a total of 30 minutes in the care of the patient today including preparing to see the patient, getting/reviewing separately obtained history, performing a medically appropriate  exam/evaluation, counseling and educating, placing orders, referring and communicating with other health care professionals, documenting clinical information in the EHR, independently interpreting results, communicating results, and coordinating care.   Viinay K Terissa Haffey, MD 08/20/24

## 2024-08-20 NOTE — Assessment & Plan Note (Signed)
 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  No distant metastatic disease. Bone scan 10/29/2022: No evidence of bone metastasis   11/08/2022 and 11/18/2022: Margin reexcision surgeries: Final margin focal positivity.  After much discussion the plan is to finish her systemic treatment and come back at a later point to do mastectomy.   Treatment summary: Adjuvant chemotherapy with dose dense Adriamycin  and Cytoxan  followed by Taxol  to be completed 05/02/2023, Keytruda  05/25/2023-08/17/2023 (discontinued due to immune mediated adverse effects: Skin rash) Mastectomy by Dr. Ebbie 05/03/2023:Right mastectomy: 8 cm grade 3 IDC with extensive lymphatic and vascular invasion RCB class III, deep margin multifocally positive, 5/5 lymph nodes positive, ER weak 10%, PR 0%, HER2 0, Ki-67 30%, total lymph nodes: 10/10 (prior lumpectomy she had 5 positive nodes) Adjuvant radiation to start 06/09/2023-07/21/23 Capecitabine  started 05/25/2023-11/25/2023 ------------------------------------------------------------------------------------------------------------------------------------------------ Guardant reveal 12/14/2023: CT DNA detected 2.7% 02/16/2024: CT CAP: Interval enlargement of left superior mediastinal lymph node 2 x 1.8 cm concerning for metastatic disease   Right axillary lymph node biopsy: Positive for metastatic ductal carcinoma ER 0%, PR 0%, HER2 0, Ki-67 50% Guardant  360 12/26/2023: T p53 present, TMB 5.69, MSI high not detected   07/30/2024: CT CAP: New irregular soft tissue mass anterior mediastinum, increased in size and number of lower cervical, mediastinal, left hilar and left axillary nodes.  Large infiltrating mass right latissimus dorsi muscle which is up to intercostal muscles, right upper lobe lymphangitic carcinomatosis changes, no bone metastasis   Shortness of breath: Status post thoracentesis, 1.1 L removed on 08/01/2024, although she felt better she continues to feel short of breath and fatigue.   Right chest wall pain: Related to the tumor.  She is currently on oxycodone .  She also takes Tylenol  and ibuprofen as needed.   Current treatment: Enhertu started 08/09/2024 today is cycle 1 day 10 If she is able to tolerate these treatments, and she is responding to the treatment, we may be able to place a port in the future. Echocardiogram 07/27/2024: EF 60 to 65%  Chemotoxicities:    Follow-up with cycle 2

## 2024-08-21 ENCOUNTER — Other Ambulatory Visit: Payer: Self-pay | Admitting: *Deleted

## 2024-08-21 ENCOUNTER — Ambulatory Visit (HOSPITAL_COMMUNITY)
Admission: RE | Admit: 2024-08-21 | Discharge: 2024-08-21 | Disposition: A | Discharge status: Home / Self Care | Source: Ambulatory Visit | Attending: Hematology and Oncology | Admitting: Hematology and Oncology

## 2024-08-21 ENCOUNTER — Encounter: Payer: Self-pay | Admitting: Rehabilitation

## 2024-08-21 ENCOUNTER — Encounter: Payer: Self-pay | Admitting: Hematology and Oncology

## 2024-08-21 ENCOUNTER — Ambulatory Visit: Admitting: Rehabilitation

## 2024-08-21 DIAGNOSIS — J90 Pleural effusion, not elsewhere classified: Secondary | ICD-10-CM

## 2024-08-21 DIAGNOSIS — Z170 Estrogen receptor positive status [ER+]: Secondary | ICD-10-CM

## 2024-08-21 DIAGNOSIS — R0602 Shortness of breath: Secondary | ICD-10-CM | POA: Diagnosis not present

## 2024-08-21 DIAGNOSIS — R293 Abnormal posture: Secondary | ICD-10-CM

## 2024-08-21 DIAGNOSIS — Z483 Aftercare following surgery for neoplasm: Secondary | ICD-10-CM

## 2024-08-21 DIAGNOSIS — R918 Other nonspecific abnormal finding of lung field: Secondary | ICD-10-CM | POA: Diagnosis not present

## 2024-08-21 DIAGNOSIS — M25611 Stiffness of right shoulder, not elsewhere classified: Secondary | ICD-10-CM

## 2024-08-21 DIAGNOSIS — C50411 Malignant neoplasm of upper-outer quadrant of right female breast: Secondary | ICD-10-CM | POA: Diagnosis not present

## 2024-08-21 NOTE — Progress Notes (Signed)
 Received mychart message from pt with complaint of worsening shortness of breath. Verbal orders received and placed per MD for pt to undergo chest xray to evaluate for pleural effusion.  Pt notified and verbalized understanding.

## 2024-08-21 NOTE — Progress Notes (Signed)
 RN reviewed chest xray with MD.  Verbal orders received and placed for pt to be scheduled for thoracentesis.  Appt scheduled, pt notified and verbalized understanding.

## 2024-08-21 NOTE — Therapy (Signed)
 OUTPATIENT PHYSICAL THERAPY  UPPER EXTREMITY ONCOLOGY TREATMENT  Patient Name: Caitlin [name] MRN: [id number] DOB:[birth year], 68 y.o., female Today's Date: 08/21/2024  END OF SESSION:  PT End of Session - 08/21/24 0955     Visit Number 7    Number of Visits 14    Date for Recertification  09/12/24    PT Start Time 1000    PT Stop Time 1040    PT Time Calculation (min) 40 min    Activity Tolerance Patient tolerated treatment well;Treatment limited secondary to medical complications (Comment)    Behavior During Therapy [facility] for tasks assessed/performed             Past Medical History:  Diagnosis Date   Asthma    Breast cancer (HCC)    right   Complication of anesthesia    Dyspnea    Dysrhythmia    2012   Headache    History of kidney stones    passed   Hypothyroidism    Pneumonia 01/26/2023   PONV (postoperative nausea and vomiting)    rt breast ca 08/2022   Right Breast   Scoliosis    Thyroid  disease    Past Surgical History:  Procedure Laterality Date   APPENDECTOMY     AXILLARY SENTINEL NODE BIOPSY Right 10/14/2022   Procedure: RIGHT AXILLARY SENTINEL NODE BIOPSY;  Surgeon: Ebbie Cough, MD;  Location: [facility] OR;  Service: General;  Laterality: Right;   BREAST BIOPSY Right 09/15/2022   US  RT BREAST BX W LOC DEV 1ST LESION IMG BX SPEC US  GUIDE 09/15/2022 [facility]   BREAST BIOPSY  10/12/2022   US  RT RADIOACTIVE SEED LOC 10/12/2022 [facility]   BREAST LUMPECTOMY WITH RADIOACTIVE SEED AND SENTINEL LYMPH NODE BIOPSY Right 10/14/2022   Procedure: RIGHT BREAST LUMPECTOMY WITH RADIOACTIVE SEED;  Surgeon: Ebbie Cough, MD;  Location: [facility] OR;  Service: General;  Laterality: Right;   COLONOSCOPY     LAPAROSCOPIC APPENDECTOMY     MASTECTOMY Right    PORT-A-CATH REMOVAL Left 05/03/2023   Procedure: REMOVAL PORT-A-CATH;  Surgeon: Ebbie Cough, MD;  Location: [facility] OR;  Service: General;  Laterality: Left;   PORT-A-CATH REMOVAL Left  12/22/2023   Procedure: REMOVAL PORT-A-CATH;  Surgeon: Ebbie Cough, MD;  Location: [facility];  Service: General;  Laterality: Left;   PORTACATH PLACEMENT Left 10/14/2022   Procedure: INSERTION PORT-A-CATH;  Surgeon: Ebbie Cough, MD;  Location: [facility] OR;  Service: General;  Laterality: Left;   PORTACATH PLACEMENT Left 05/19/2023   Procedure: PORT PLACEMENT WITH ULTRASOUND GUIDANCE;  Surgeon: Ebbie Cough, MD;  Location: [facility];  Service: General;  Laterality: Left;   RE-EXCISION OF BREAST LUMPECTOMY Right 11/08/2022   Procedure: RE-EXCISION OF RIGHT BREAST LUMPECTOMY;  Surgeon: Ebbie Cough, MD;  Location: [facility];  Service: General;  Laterality: Right;   RE-EXCISION OF BREAST LUMPECTOMY Right 11/18/2022   Procedure: RE-EXCISION OF RIGHT BREAST LUMPECTOMY;  Surgeon: Ebbie Cough, MD;  Location: [facility] OR;  Service: General;  Laterality: Right;   SIMPLE MASTECTOMY WITH AXILLARY SENTINEL NODE BIOPSY Right 05/03/2023   Procedure: RIGHT MASTECTOMY;  Surgeon: Ebbie Cough, MD;  Location: [facility] OR;  Service: General;  Laterality: Right;  GEN & PEC BLOCK   WISDOM TOOTH EXTRACTION     Patient Active Problem List   Diagnosis Date Noted   S/P mastectomy, right 05/03/2023   Port-A-Cath in place 12/16/2022   Genetic testing 10/11/2022   Appendicitis with peritonitis 09/29/2022   Family history of  prostate cancer 09/29/2022   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female, estrogen receptor positive (HCC) 09/22/2022   Atrial fibrillation with RVR (HCC) 11/30/2010   Chronic anticoagulation 11/30/2010   Hypothyroidism 11/30/2010   PALPITATIONS 11/27/2010    PCP: Jerel Sieving MD  REFERRING PROVIDER: Lynwood Nasuti, MD  REFERRING DIAG: [phone] (ICD-10-CM) - 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female, estrogen receptor positive (HCC)  THERAPY DIAG:  Malignant neoplasm  of upper-outer quadrant of right breast in female, estrogen receptor positive (HCC)  Aftercare following surgery for neoplasm  Abnormal posture  Stiffness of right shoulder, not elsewhere classified  ONSET DATE: 05/03/23  Rationale for Evaluation and Treatment: Rehabilitation  SUBJECTIVE:                                                                                                                                                                                           SUBJECTIVE STATEMENT: I am unable to lay flat due to SOB now.  I will be going to get a chest xray after this  PERTINENT HISTORY:   10/14/22:   Rt lumpectomy and SLNB with 5/5 nodes positive 11/08/22:   Re excision of Rt lumpectomy 11/18/22: Re excision of Rt lumpectomy  2/29/4: Chemotherapy ACP 05/03/23 Rt mastectomy with positive margins 5/5 nodes, 07/21/23: completed radiation 05/22/24: disease progression with new low left jugular and mediastinal adenopathy. No bone METS 07/30/24: CT CAP: New irregular soft tissue mass anterior mediastinum, increased in size and number of lower cervical, mediastinal, left hilar and left axillary nodes. Large infiltrating mass right latissimus dorsi muscle which is up to intercostal muscles, right upper lobe lymphangitic carcinomatosis changes, no bone metastasis    PAIN:  PAIN:  Are you having pain? Yes: NPRS scale: 1/10 Pain location: Rt axilla  Pain description: ender at the axilla Aggravating factors: Gets worse as the day goes on and pulls really bad when I reach into the axilla to trunk Relieving factors: Tylenol , MLD    PRECAUTIONS: Other: at risk of lymphedema  RED FLAGS: None   WEIGHT BEARING RESTRICTIONS: No  FALLS:  Has patient fallen in last 6 months? No  LIVING ENVIRONMENT:/ Lives with: lives with their family Lives in: House/apartment Has following equipment at home: None  OCCUPATION: retired   LEISURE: walks from house to barn to feed her horses, had  been doing a full body exercise program every day up until a few weeks ago - discontinued due to shoulder/axilla pain   HAND DOMINANCE: right   PRIOR LEVEL OF FUNCTION: Independent  PATIENT GOALS: to get more range of motion of R shoulder  OBJECTIVE:  COGNITION: Overall cognitive status: Within functional limits for tasks assessed   PALPATION: Several cords palpable in R axilla extending to scar and down upper arm  OBSERVATIONS / OTHER ASSESSMENTS: steri strips still in place over healing mastectomy scar, cording visible in R axilla with abduction  POSTURE: rounded shoulders, forward head  UPPER EXTREMITY AROM/PROM:  A/PROM RIGHT   eval  RIGHT 07/12/24 Right 08/15/24   Shoulder extension 77 23 62  Shoulder flexion 110 93 85  Shoulder abduction 116 87 83  Shoulder internal rotation 75    Shoulder external rotation 57      (Blank rows = not tested)  A/PROM LEFT   eval  Shoulder extension 67  Shoulder flexion 157  Shoulder abduction 158  Shoulder internal rotation 62  Shoulder external rotation 83    (Blank rows = not tested)   UPPER EXTREMITY STRENGTH:   LYMPHEDEMA ASSESSMENTS:  SURGERY TYPE/DATE: R mastectomy and SLNB on 10/14/22 followed by chemo and then radiation.  10/14/22: R breast lumpectomy 5/5, 11/08/22: Re excision of R lumpectomy, 11/18/22 - Re excision of R lumpectomy,completed neoadjuvant, 05/03/23 R mastectomy with positive margins 5/5 nodes, NUMBER OF LYMPH NODES REMOVED: see above CHEMOTHERAPY: completed neoadjuvant RADIATION:beginning 06/08/23  LYMPHEDEMA ASSESSMENTS:   LANDMARK RIGHT  eval RIGHT 07/12/24  At axilla  33 31.5  15 cm proximal to olecranon process 29.5 30.4  10 cm proximal to olecranon process 27 28.2  Olecranon process 23.5 23.9  15 cm proximal to ulnar styloid process 23.1 23.1  10 cm proximal to ulnar styloid process 20 19.8  Just proximal to ulnar styloid process 15.6 15.6  Across hand at thumb web space 20.9 19.3  At base of  2nd digit 6.5 6.5  (Blank rows = not tested)  Total volume at initial evaluation: 389.65 cm Total volume at 07/12/24 evaluation: 387.27 cm Volume change: ?2.38 cm Percentage change: ?0.61% From copilot   LANDMARK LEFT  eval LEFT  At axilla  32.5 31.5  15 cm proximal to olecranon process 29.1 28.4  10 cm proximal to olecranon process 27.5 27.6  Olecranon process 23.9 23.4  15 cm proximal to ulnar styloid process 22.7 22.9  10 cm proximal to ulnar styloid process 19.7 19.8  Just proximal to ulnar styloid process 15.2 15.4  Across hand at thumb web space 19 19.8  At base of 2nd digit 6.2 6.3  (Blank rows = not tested)  Initial volume: 379.14 cm Volume on 07/12/24: 373.49 cm Volume change: ?5.64 cm Percentage change: ?1.49% From copilot   QUICK DASH SURVEY:    07/12/24 - 40.91%   TODAY'S TREATMENT:                                                                                                                                          DATE:  08/21/24 Pulleys into flexion HOB at 45 deg where pt is  able to breathe easier - PROM to the Rt shoulder into flexion, abd, ER/IR, including some gentle oscillations in all directions GH AP pressure more for posture and pain relief/relaxation grade 1-2 between each set of PROM  08/15/24: Manual Therapy MLD to Rt UE as follows: Short neck, 5 diaphragmatic breaths, Rt inguinal nodes and Rt axillo-inguinal anastomosis (very light along anastomosis due to progression of disease near skin level at Rt lateral trunk), then Rt UE working from proximal to distal (lateral upper arm, medial to lateral and lateral again, ant/post forearm, and then dorsal wrist/hand) and then retracing all steps back to LN's. P/ROM very gently to Rt shoulder into flexion and er mostly, a few times into scaption and D2 but pt reports increased pain/pulling at axilla into new area of growth at lateral trunk so D/C these 2 motions and focused on what was tolerable for  pt, gentle scapular depression by therapist throughout to allow for improved comfort during stretching.   07/25/24 Examined redness that was visible through the arm whole of pt's shirt near the biopsy site.   It appears to be purple/red at the lateral trunk and chest and towards the sternum at the tumor site which appears to have some progression which pt is also aware of and reports she thinks it is just from the tumor.  Discussed POC: pt is ind with self MLD and compression and we will switch to maintaining Rom and functional movement and treat pain and swelling as needed.   Pulleys into flexion and scaption x 2-3 min each with rest break between Wall ball flexion x 5 Supine dowel chest press x 10 Supine dowel flexion x 5 Palms up extension presses 5 x 10 with cueing to include the shoulder Assessed breathing which incorporated diaphragmatic breathing well without cueing PROM into flexion, abduction, and ER to tolerance  MLD of the arm only  07/23/24 Therapeutic Exercise Pulleys into flexion and scaption x each for improved reach on the Rt arm Wall ball flexion x 5  Manual Therapy MLD to Rt UE Short neck, 5 resisted diaphragmatic breaths, Lt axillary and pectoral nodes, anterior inter-axillary anastomosis, Rt inguinal nodes, Rt axillo-inguinals anastomosis, then Rt UE working from proximal to distal (lateral upper arm, medial to lateral and lateral again, anterior and posterior forearm, and dorsal wrist/hand and fingers) retracing all steps ending with LN's.  P/ROM to Rt shoulder into flex, abd and D2 with gentle scapular depression throughout,  STM to Rt pect insertion where pt palpably tight     PATIENT EDUCATION:  Education details: POC, HEP, importance of wearing compression sleeve for at least 10-12 hours per day Person educated: Patient Education method: Explanation, Demonstration, and Handouts Education comprehension: verbalized understanding and returned  demonstration  HOME EXERCISE PROGRAM: Access Code: [email or web address] URL: [email or web address] Date: 07/12/2024 Prepared by: Delon Pack  Exercises - Seated Scapular Retraction  - 1 x daily - 7 x weekly - 3 sets - 10 reps - Seated Shoulder Flexion Towel Slide at Table Top  - 1 x daily - 7 x weekly - 3 sets - 10 reps - Seated Shoulder Scaption Slide at Table Top with Forearm in Neutral  - 1 x daily - 7 x weekly - 3 sets - 10 reps - Supine Shoulder Flexion AAROM with Hands Clasped  - 1 x daily - 7 x weekly - 3 sets - 10 reps  ASSESSMENT:  CLINICAL IMPRESSION: Pt is having worsening SOB and will be going for possible thoracocentesis after this  but would like to continue with PROM work.  This was tolerated at 45deg elevation of the bed.  PROM and relaxation focus due to SOB and mobility difficulty.  Despite this pt is doing much better in terms of the swelling that was in the UE and with the pain of the tumor.  It is now only at 1/10 and the arm appears very normal in size.    OBJECTIVE IMPAIRMENTS: decreased ROM, decreased strength, hypomobility, increased fascial restrictions, impaired UE functional use, postural dysfunction, and pain.   ACTIVITY LIMITATIONS: carrying, lifting, bathing, reach over head, and hygiene/grooming  PARTICIPATION LIMITATIONS: meal prep, cleaning, laundry, yard work, and difficulty with feeding and giving her horses water  PERSONAL FACTORS: 1 comorbidity: previous history of R lumpectomy are also affecting patient's functional outcome.   REHAB POTENTIAL: Good  CLINICAL DECISION MAKING: Stable/uncomplicated  EVALUATION COMPLEXITY: Low  GOALS: Goals reviewed with patient? Yes  SHORT TERM GOALS=LONG TERM GOALS Target date: 08/09/24  Pt will improve shoulder AROM in all directions for improved functional mobility to at least baseline  Baseline:  Goal status: ONGOING - see above  2.  Pt will demonstrate an improvement in Quick-DASH score from 40.91  to <10 in order to return to baseline function  Baseline: 40.91 ; 08/15/24 - 43.18 Goal status: ONGOING  3.  Pt will compliant with wearing her compression sleeve to decrease swelling and return SOZO score back to baseline  Baseline: 08/15/24 - pt wears this daily now Goal status: MET  4.  Pt will be independent in a home exercise program for continued strengthening and stretching.  Baseline:  Goal status: ONGOING  PLAN:  PT FREQUENCY: 2x/week  PT DURATION: 4 weeks  PLANNED INTERVENTIONS: Therapeutic exercises, Therapeutic activity, Patient/Family education, Self Care, Orthotic/Fit training, Manual lymph drainage, scar mobilization, Taping, Vasopneumatic device, and Manual therapy  PLAN FOR NEXT SESSION: Renewal done this session. Cont and review Rt UE MLD, STM to Rt upper quadrant PRN, (avoid Rt lateral trunk with active tumor) PROM to R shoulder, pulleys, ball as pt tolerates   Berwyn Knights, PTA 08/21/24 11:51 AM

## 2024-08-22 ENCOUNTER — Ambulatory Visit (HOSPITAL_COMMUNITY)
Admission: RE | Admit: 2024-08-22 | Discharge: 2024-08-22 | Disposition: A | Discharge status: Home / Self Care | Source: Ambulatory Visit | Attending: Radiology | Admitting: Radiology

## 2024-08-22 ENCOUNTER — Ambulatory Visit (HOSPITAL_COMMUNITY)
Admission: RE | Admit: 2024-08-22 | Discharge: 2024-08-22 | Disposition: A | Discharge status: Home / Self Care | Source: Ambulatory Visit | Attending: Hematology and Oncology | Admitting: Hematology and Oncology

## 2024-08-22 DIAGNOSIS — Z48813 Encounter for surgical aftercare following surgery on the respiratory system: Secondary | ICD-10-CM | POA: Diagnosis not present

## 2024-08-22 DIAGNOSIS — Z853 Personal history of malignant neoplasm of breast: Secondary | ICD-10-CM | POA: Insufficient documentation

## 2024-08-22 DIAGNOSIS — J9811 Atelectasis: Secondary | ICD-10-CM | POA: Diagnosis not present

## 2024-08-22 DIAGNOSIS — J90 Pleural effusion, not elsewhere classified: Secondary | ICD-10-CM

## 2024-08-22 DIAGNOSIS — I2699 Other pulmonary embolism without acute cor pulmonale: Secondary | ICD-10-CM | POA: Diagnosis not present

## 2024-08-22 MED ORDER — LIDOCAINE-EPINEPHRINE (PF) 2 %-1:200000 IJ SOLN
INTRAMUSCULAR | Status: AC
  Filled 2024-08-22: qty 20

## 2024-08-22 NOTE — Procedures (Signed)
 Ultrasound-guided therapeutic left thoracentesis performed yielding 1.1 liters of yellow fluid. No immediate complications. Follow-up chest x-ray pending. Due to pt coughing only the above amount of fluid was removed today. EBL none.

## 2024-08-23 ENCOUNTER — Other Ambulatory Visit: Payer: Self-pay | Admitting: *Deleted

## 2024-08-23 ENCOUNTER — Other Ambulatory Visit: Payer: Self-pay

## 2024-08-23 DIAGNOSIS — J90 Pleural effusion, not elsewhere classified: Secondary | ICD-10-CM

## 2024-08-24 ENCOUNTER — Ambulatory Visit (HOSPITAL_COMMUNITY)
Admission: RE | Admit: 2024-08-24 | Discharge: 2024-08-24 | Disposition: A | Discharge status: Home / Self Care | Source: Ambulatory Visit | Attending: Hematology and Oncology | Admitting: Hematology and Oncology

## 2024-08-24 ENCOUNTER — Ambulatory Visit (HOSPITAL_COMMUNITY)
Admission: RE | Admit: 2024-08-24 | Discharge: 2024-08-24 | Disposition: A | Discharge status: Home / Self Care | Source: Ambulatory Visit | Attending: Student | Admitting: Student

## 2024-08-24 DIAGNOSIS — Z48813 Encounter for surgical aftercare following surgery on the respiratory system: Secondary | ICD-10-CM | POA: Diagnosis not present

## 2024-08-24 DIAGNOSIS — J939 Pneumothorax, unspecified: Secondary | ICD-10-CM | POA: Diagnosis not present

## 2024-08-24 DIAGNOSIS — J90 Pleural effusion, not elsewhere classified: Secondary | ICD-10-CM | POA: Insufficient documentation

## 2024-08-24 DIAGNOSIS — Z853 Personal history of malignant neoplasm of breast: Secondary | ICD-10-CM | POA: Insufficient documentation

## 2024-08-24 DIAGNOSIS — J9811 Atelectasis: Secondary | ICD-10-CM | POA: Diagnosis not present

## 2024-08-24 MED ORDER — LIDOCAINE-EPINEPHRINE (PF) 2 %-1:200000 IJ SOLN
INTRAMUSCULAR | Status: AC
  Filled 2024-08-24: qty 20

## 2024-08-24 NOTE — Procedures (Signed)
 PROCEDURE SUMMARY:  Successful US  guided right thoracentesis. Yielded 400 mL of yellow fluid. Pt tolerated procedure well. No immediate complications, although patient did complain of lightheadedness post-procedure.  Placed in semi-reclined position as tolerated and provided PO fluids. Patient monitored for improvement.   Specimen was not sent for labs. CXR ordered.  EBL < 5 mL  Solmon Selmer Ku PA-C 08/24/2024 12:27 PM

## 2024-08-24 NOTE — Progress Notes (Signed)
 Interventional Radiology Brief Note:  Post-procedure CXR showed a small apical pneumothorax.  Patient resting comfortably in waiting room.  No shortness of breath, no chest pain, lightheadedness resolved.  Dr. Jenna recommended a 1 hr repeat CXR to confirm stability prior to discharge.  CXR completed and read by Dr. Luverne as stable <5% apical pneumothorax.  Patient updated with husband at bedside.  She confirms no change in symptoms or distress.  She is stable for discharge home. Given PRA after-hours number and return to ED precautions if needed.   Catherene Kaleta, MS RD PA-C

## 2024-08-25 ENCOUNTER — Inpatient Hospital Stay (HOSPITAL_COMMUNITY)
Admission: EM | Admit: 2024-08-25 | Discharge: 2024-08-29 | DRG: 163 | Disposition: A | Discharge status: Home Health | Attending: Internal Medicine | Admitting: Internal Medicine

## 2024-08-25 ENCOUNTER — Emergency Department (HOSPITAL_COMMUNITY)

## 2024-08-25 ENCOUNTER — Other Ambulatory Visit: Payer: Self-pay

## 2024-08-25 ENCOUNTER — Inpatient Hospital Stay (HOSPITAL_COMMUNITY)

## 2024-08-25 DIAGNOSIS — R54 Age-related physical debility: Secondary | ICD-10-CM | POA: Diagnosis present

## 2024-08-25 DIAGNOSIS — Z7982 Long term (current) use of aspirin: Secondary | ICD-10-CM | POA: Diagnosis not present

## 2024-08-25 DIAGNOSIS — E039 Hypothyroidism, unspecified: Secondary | ICD-10-CM | POA: Diagnosis present

## 2024-08-25 DIAGNOSIS — I517 Cardiomegaly: Secondary | ICD-10-CM | POA: Diagnosis not present

## 2024-08-25 DIAGNOSIS — J939 Pneumothorax, unspecified: Secondary | ICD-10-CM | POA: Diagnosis present

## 2024-08-25 DIAGNOSIS — J910 Malignant pleural effusion: Secondary | ICD-10-CM | POA: Diagnosis present

## 2024-08-25 DIAGNOSIS — R0602 Shortness of breath: Secondary | ICD-10-CM | POA: Diagnosis not present

## 2024-08-25 DIAGNOSIS — J811 Chronic pulmonary edema: Secondary | ICD-10-CM | POA: Diagnosis not present

## 2024-08-25 DIAGNOSIS — C771 Secondary and unspecified malignant neoplasm of intrathoracic lymph nodes: Secondary | ICD-10-CM | POA: Diagnosis present

## 2024-08-25 DIAGNOSIS — I480 Paroxysmal atrial fibrillation: Secondary | ICD-10-CM | POA: Diagnosis not present

## 2024-08-25 DIAGNOSIS — C50911 Malignant neoplasm of unspecified site of right female breast: Secondary | ICD-10-CM | POA: Diagnosis present

## 2024-08-25 DIAGNOSIS — E875 Hyperkalemia: Secondary | ICD-10-CM | POA: Diagnosis present

## 2024-08-25 DIAGNOSIS — Z9011 Acquired absence of right breast and nipple: Secondary | ICD-10-CM | POA: Diagnosis not present

## 2024-08-25 DIAGNOSIS — Z7951 Long term (current) use of inhaled steroids: Secondary | ICD-10-CM | POA: Diagnosis not present

## 2024-08-25 DIAGNOSIS — C801 Malignant (primary) neoplasm, unspecified: Secondary | ICD-10-CM | POA: Diagnosis not present

## 2024-08-25 DIAGNOSIS — J9601 Acute respiratory failure with hypoxia: Secondary | ICD-10-CM | POA: Diagnosis not present

## 2024-08-25 DIAGNOSIS — C50411 Malignant neoplasm of upper-outer quadrant of right female breast: Secondary | ICD-10-CM | POA: Diagnosis not present

## 2024-08-25 DIAGNOSIS — Z7901 Long term (current) use of anticoagulants: Secondary | ICD-10-CM | POA: Diagnosis not present

## 2024-08-25 DIAGNOSIS — Z79890 Hormone replacement therapy (postmenopausal): Secondary | ICD-10-CM

## 2024-08-25 DIAGNOSIS — Z832 Family history of diseases of the blood and blood-forming organs and certain disorders involving the immune mechanism: Secondary | ICD-10-CM

## 2024-08-25 DIAGNOSIS — J90 Pleural effusion, not elsewhere classified: Secondary | ICD-10-CM | POA: Diagnosis not present

## 2024-08-25 DIAGNOSIS — I471 Supraventricular tachycardia, unspecified: Secondary | ICD-10-CM | POA: Diagnosis not present

## 2024-08-25 DIAGNOSIS — R578 Other shock: Secondary | ICD-10-CM | POA: Diagnosis present

## 2024-08-25 DIAGNOSIS — Z8349 Family history of other endocrine, nutritional and metabolic diseases: Secondary | ICD-10-CM

## 2024-08-25 DIAGNOSIS — Z452 Encounter for adjustment and management of vascular access device: Secondary | ICD-10-CM | POA: Diagnosis not present

## 2024-08-25 DIAGNOSIS — I2602 Saddle embolus of pulmonary artery with acute cor pulmonale: Secondary | ICD-10-CM | POA: Diagnosis not present

## 2024-08-25 DIAGNOSIS — Z8249 Family history of ischemic heart disease and other diseases of the circulatory system: Secondary | ICD-10-CM | POA: Diagnosis not present

## 2024-08-25 DIAGNOSIS — Z79899 Other long term (current) drug therapy: Secondary | ICD-10-CM

## 2024-08-25 DIAGNOSIS — I2609 Other pulmonary embolism with acute cor pulmonale: Secondary | ICD-10-CM | POA: Diagnosis not present

## 2024-08-25 DIAGNOSIS — I2699 Other pulmonary embolism without acute cor pulmonale: Secondary | ICD-10-CM | POA: Diagnosis not present

## 2024-08-25 DIAGNOSIS — Z7983 Long term (current) use of bisphosphonates: Secondary | ICD-10-CM | POA: Diagnosis not present

## 2024-08-25 DIAGNOSIS — J4540 Moderate persistent asthma, uncomplicated: Secondary | ICD-10-CM | POA: Diagnosis not present

## 2024-08-25 DIAGNOSIS — Z825 Family history of asthma and other chronic lower respiratory diseases: Secondary | ICD-10-CM

## 2024-08-25 DIAGNOSIS — R918 Other nonspecific abnormal finding of lung field: Secondary | ICD-10-CM | POA: Diagnosis not present

## 2024-08-25 DIAGNOSIS — Z4682 Encounter for fitting and adjustment of non-vascular catheter: Secondary | ICD-10-CM | POA: Diagnosis not present

## 2024-08-25 HISTORY — PX: IR US GUIDE VASC ACCESS RIGHT: IMG2390

## 2024-08-25 HISTORY — PX: IR US GUIDE VASC ACCESS LEFT: IMG2389

## 2024-08-25 HISTORY — PX: IR TUNNELED CENTRAL VENOUS CATH PLC W IMG: IMG1939

## 2024-08-25 HISTORY — PX: IR THROMBECT PRIM MECH INIT (INCLU) MOD SED: IMG2297

## 2024-08-25 HISTORY — PX: IR THROMBECT VENO MECH MOD SED: IMG2300

## 2024-08-25 LAB — CBC
HCT: 39.6 % (ref 36.0–46.0)
HCT: 35.3 % — ABNORMAL LOW (ref 36.0–46.0)
Hemoglobin: 13.1 g/dL (ref 12.0–15.0)
Hemoglobin: 11.7 g/dL — ABNORMAL LOW (ref 12.0–15.0)
MCH: 32.2 pg (ref 26.0–34.0)
MCH: 32.2 pg (ref 26.0–34.0)
MCHC: 33.1 g/dL (ref 30.0–36.0)
MCHC: 33.1 g/dL (ref 30.0–36.0)
MCV: 97.3 fL (ref 80.0–100.0)
MCV: 97.2 fL (ref 80.0–100.0)
Platelets: 536 K/uL — ABNORMAL HIGH (ref 150–400)
Platelets: 507 K/uL — ABNORMAL HIGH (ref 150–400)
RBC: 4.07 MIL/uL (ref 3.87–5.11)
RBC: 3.63 MIL/uL — ABNORMAL LOW (ref 3.87–5.11)
RDW: 15.2 % (ref 11.5–15.5)
RDW: 15.3 % (ref 11.5–15.5)
WBC: 5.6 K/uL (ref 4.0–10.5)
WBC: 5.4 K/uL (ref 4.0–10.5)
nRBC: 0 % (ref 0.0–0.2)
nRBC: 0 % (ref 0.0–0.2)

## 2024-08-25 LAB — MAGNESIUM: Magnesium: 2.4 mg/dL (ref 1.7–2.4)

## 2024-08-25 LAB — COMPREHENSIVE METABOLIC PANEL WITH GFR
ALT: 10 U/L (ref 0–44)
AST: 23 U/L (ref 15–41)
Albumin: 3.1 g/dL — ABNORMAL LOW (ref 3.5–5.0)
Alkaline Phosphatase: 71 U/L (ref 38–126)
Anion gap: 11 (ref 5–15)
BUN: 25 mg/dL — ABNORMAL HIGH (ref 8–23)
CO2: 19 mmol/L — ABNORMAL LOW (ref 22–32)
Calcium: 7.9 mg/dL — ABNORMAL LOW (ref 8.9–10.3)
Chloride: 108 mmol/L (ref 98–111)
Creatinine, Ser: 0.68 mg/dL (ref 0.44–1.00)
GFR, Estimated: >60 mL/min (ref >60)
Glucose, Bld: 99 mg/dL (ref 70–99)
Potassium: 3.8 mmol/L (ref 3.5–5.1)
Sodium: 138 mmol/L (ref 135–145)
Total Bilirubin: 0.4 mg/dL (ref 0.0–1.2)
Total Protein: 5.7 g/dL — ABNORMAL LOW (ref 6.5–8.1)

## 2024-08-25 LAB — BASIC METABOLIC PANEL WITH GFR
Anion gap: 14 (ref 5–15)
BUN: 30 mg/dL — ABNORMAL HIGH (ref 8–23)
CO2: 19 mmol/L — ABNORMAL LOW (ref 22–32)
Calcium: 9.2 mg/dL (ref 8.9–10.3)
Chloride: 104 mmol/L (ref 98–111)
Creatinine, Ser: 1 mg/dL (ref 0.44–1.00)
GFR, Estimated: >60 mL/min (ref >60)
Glucose, Bld: 109 mg/dL — ABNORMAL HIGH (ref 70–99)
Potassium: 4.7 mmol/L (ref 3.5–5.1)
Sodium: 137 mmol/L (ref 135–145)

## 2024-08-25 LAB — I-STAT CHEM 8, ED
BUN: 38 mg/dL — ABNORMAL HIGH (ref 8–23)
Calcium, Ion: 1 mmol/L — ABNORMAL LOW (ref 1.15–1.40)
Chloride: 111 mmol/L (ref 98–111)
Creatinine, Ser: 0.7 mg/dL (ref 0.44–1.00)
Glucose, Bld: 95 mg/dL (ref 70–99)
HCT: 35 % — ABNORMAL LOW (ref 36.0–46.0)
Hemoglobin: 11.9 g/dL — ABNORMAL LOW (ref 12.0–15.0)
Potassium: 5.8 mmol/L — ABNORMAL HIGH (ref 3.5–5.1)
Sodium: 136 mmol/L (ref 135–145)
TCO2: 19 mmol/L — ABNORMAL LOW (ref 22–32)

## 2024-08-25 LAB — TYPE AND SCREEN
ABO/RH(D): O POS
Antibody Screen: NEGATIVE

## 2024-08-25 LAB — PROTIME-INR
INR: 1.2 (ref 0.8–1.2)
Prothrombin Time: 16.2 s — ABNORMAL HIGH (ref 11.4–15.2)

## 2024-08-25 LAB — PHOSPHORUS: Phosphorus: 3.1 mg/dL (ref 2.5–4.6)

## 2024-08-25 LAB — ABO/RH: ABO/RH(D): O POS

## 2024-08-25 LAB — MRSA NEXT GEN BY PCR, NASAL: MRSA by PCR Next Gen: NOT DETECTED

## 2024-08-25 LAB — PRO BRAIN NATRIURETIC PEPTIDE: Pro Brain Natriuretic Peptide: 4650 pg/mL — ABNORMAL HIGH (ref <300.0)

## 2024-08-25 LAB — HEPARIN LEVEL (UNFRACTIONATED): Heparin Unfractionated: >1.1 [IU]/mL — ABNORMAL HIGH (ref 0.30–0.70)

## 2024-08-25 LAB — I-STAT CG4 LACTIC ACID, ED: Lactic Acid, Venous: 1.6 mmol/L (ref 0.5–1.9)

## 2024-08-25 LAB — HIV ANTIBODY (ROUTINE TESTING W REFLEX): HIV Screen 4th Generation wRfx: NONREACTIVE

## 2024-08-25 LAB — TROPONIN T, HIGH SENSITIVITY: Troponin T High Sensitivity: 83 ng/L — ABNORMAL HIGH (ref 0–19)

## 2024-08-25 LAB — POCT ACTIVATED CLOTTING TIME: Activated Clotting Time: 158 s

## 2024-08-25 MED ORDER — HEPARIN SODIUM (PORCINE) 1000 UNIT/ML IJ SOLN
INTRAMUSCULAR | Status: AC | PRN
  Administered 2024-08-25: 5000 [IU] via INTRAVENOUS

## 2024-08-25 MED ORDER — LIDOCAINE HCL 1 % IJ SOLN
INTRAMUSCULAR | Status: AC
  Filled 2024-08-25: qty 20

## 2024-08-25 MED ORDER — FENTANYL CITRATE (PF) 100 MCG/2ML IJ SOLN
INTRAMUSCULAR | Status: AC | PRN
Start: 2024-08-25 — End: 2024-08-25
  Administered 2024-08-25: 50 ug via INTRAVENOUS
  Administered 2024-08-25 (×2): 25 ug via INTRAVENOUS

## 2024-08-25 MED ORDER — SODIUM CHLORIDE 0.9 % IV BOLUS
500.0000 mL | Freq: Once | INTRAVENOUS | Status: AC
  Administered 2024-08-25: 500 mL via INTRAVENOUS

## 2024-08-25 MED ORDER — MIDAZOLAM HCL 2 MG/2ML IJ SOLN
INTRAMUSCULAR | Status: AC
Start: 2024-08-25 — End: 2024-08-25
  Filled 2024-08-25: qty 2

## 2024-08-25 MED ORDER — HEPARIN (PORCINE) 25000 UT/250ML-% IV SOLN
1100.0000 [IU]/h | INTRAVENOUS | Status: DC
  Administered 2024-08-25: 800 [IU]/h via INTRAVENOUS
  Administered 2024-08-26 – 2024-08-27 (×2): 1100 [IU]/h via INTRAVENOUS
  Filled 2024-08-25 (×2): qty 250

## 2024-08-25 MED ORDER — PROCHLORPERAZINE MALEATE 10 MG PO TABS: 10.0000 mg | ORAL_TABLET | Freq: Four times a day (QID) | ORAL | Status: DC | PRN

## 2024-08-25 MED ORDER — HEPARIN SODIUM (PORCINE) 1000 UNIT/ML IJ SOLN
INTRAMUSCULAR | Status: AC
  Filled 2024-08-25: qty 10

## 2024-08-25 MED ORDER — HEPARIN (PORCINE) 25000 UT/250ML-% IV SOLN
950.0000 [IU]/h | INTRAVENOUS | Status: DC
  Administered 2024-08-25: 950 [IU]/h via INTRAVENOUS
  Filled 2024-08-25: qty 250

## 2024-08-25 MED ORDER — IOHEXOL 300 MG/ML  SOLN: 100.0000 mL | Freq: Once | INTRAMUSCULAR | Status: DC | PRN

## 2024-08-25 MED ORDER — OXYCODONE HCL 5 MG PO TABS: 5.0000 mg | ORAL_TABLET | Freq: Four times a day (QID) | ORAL | Status: DC | PRN

## 2024-08-25 MED ORDER — FENTANYL CITRATE (PF) 100 MCG/2ML IJ SOLN
INTRAMUSCULAR | Status: AC
  Filled 2024-08-25: qty 2

## 2024-08-25 MED ORDER — SODIUM CHLORIDE 0.9 % IV BOLUS
1000.0000 mL | Freq: Once | INTRAVENOUS | Status: AC
  Administered 2024-08-25: 1000 mL via INTRAVENOUS

## 2024-08-25 MED ORDER — HEPARIN BOLUS VIA INFUSION
2000.0000 [IU] | Freq: Once | INTRAVENOUS | Status: AC
  Administered 2024-08-25: 2000 [IU] via INTRAVENOUS
  Filled 2024-08-25: qty 2000

## 2024-08-25 MED ORDER — MIDAZOLAM HCL (PF) 2 MG/2ML IJ SOLN
INTRAMUSCULAR | Status: AC | PRN
  Administered 2024-08-25 (×2): 1 mg via INTRAVENOUS
  Administered 2024-08-25: .5 mg via INTRAVENOUS
  Administered 2024-08-25: 1 mg via INTRAVENOUS
  Administered 2024-08-25: .5 mg via INTRAVENOUS

## 2024-08-25 MED ORDER — PANTOPRAZOLE SODIUM 40 MG PO TBEC
40.0000 mg | DELAYED_RELEASE_TABLET | Freq: Every day | ORAL | Status: DC
  Administered 2024-08-26 – 2024-08-29 (×4): 40 mg via ORAL
  Filled 2024-08-25 (×5): qty 1

## 2024-08-25 MED ORDER — ACETAMINOPHEN 325 MG PO TABS
650.0000 mg | ORAL_TABLET | Freq: Four times a day (QID) | ORAL | Status: DC | PRN
Start: 2024-08-25 — End: 2024-08-29
  Administered 2024-08-26 – 2024-08-29 (×12): 650 mg via ORAL
  Filled 2024-08-25 (×12): qty 2

## 2024-08-25 MED ORDER — LIDOCAINE HCL 1 % IJ SOLN
20.0000 mL | Freq: Once | INTRAMUSCULAR | Status: AC
  Administered 2024-08-25: 20 mL

## 2024-08-25 MED ORDER — NOREPINEPHRINE 4 MG/250ML-% IV SOLN
0.0000 ug/min | INTRAVENOUS | Status: DC
  Administered 2024-08-25: 2 ug/min via INTRAVENOUS
  Filled 2024-08-25: qty 250

## 2024-08-25 MED ORDER — SODIUM CHLORIDE 0.9 % IV SOLN
INTRAVENOUS | Status: AC | PRN
  Administered 2024-08-25: 50 mL/h via INTRAVENOUS

## 2024-08-25 MED ORDER — CHLORHEXIDINE GLUCONATE CLOTH 2 % EX PADS
6.0000 | MEDICATED_PAD | Freq: Every day | CUTANEOUS | Status: DC
  Administered 2024-08-25 – 2024-08-29 (×5): 6 via TOPICAL

## 2024-08-25 MED ORDER — DOCUSATE SODIUM 100 MG PO CAPS: 100.0000 mg | ORAL_CAPSULE | Freq: Two times a day (BID) | ORAL | Status: DC | PRN

## 2024-08-25 MED ORDER — POLYETHYLENE GLYCOL 3350 17 G PO PACK: 17.0000 g | PACK | Freq: Every day | ORAL | Status: DC | PRN

## 2024-08-25 MED ORDER — IOHEXOL 350 MG/ML SOLN
75.0000 mL | Freq: Once | INTRAVENOUS | Status: AC | PRN
  Administered 2024-08-25: 75 mL via INTRAVENOUS

## 2024-08-25 MED ORDER — BUDESONIDE 0.5 MG/2ML IN SUSP
0.5000 mg | Freq: Two times a day (BID) | RESPIRATORY_TRACT | Status: DC
  Administered 2024-08-25 – 2024-08-29 (×8): 0.5 mg via RESPIRATORY_TRACT
  Filled 2024-08-25 (×8): qty 2

## 2024-08-25 MED ORDER — STERILE WATER FOR INJECTION IV SOLN
INTRAVENOUS | Status: DC
  Filled 2024-08-25: qty 1000

## 2024-08-25 MED ORDER — SODIUM CHLORIDE 0.9 % IV SOLN: 250.0000 mL | INTRAVENOUS | Status: AC

## 2024-08-25 MED ORDER — ALBUTEROL SULFATE (2.5 MG/3ML) 0.083% IN NEBU: 2.5000 mg | INHALATION_SOLUTION | RESPIRATORY_TRACT | Status: DC | PRN

## 2024-08-25 MED ORDER — LEVOTHYROXINE SODIUM 75 MCG PO TABS
75.0000 ug | ORAL_TABLET | Freq: Every day | ORAL | Status: DC
  Administered 2024-08-26 – 2024-08-29 (×3): 75 ug via ORAL
  Filled 2024-08-25 (×5): qty 1

## 2024-08-25 NOTE — Progress Notes (Signed)
 ACT: 158

## 2024-08-25 NOTE — ED Provider Notes (Signed)
 [facility] Provider Note   CSN: [id number] Arrival date & time: 08/25/24  1045     Patient presents with: No chief complaint on file.   Caitlin [name] is a 68 y.o. female hx of metatstatic breast cancer.3 weeks ago developed sob and was ffound to have a pleural effusion. Patien had a thoracentesis and they took off 1100 cc fluid. This week sob returned and patient underwent a second Left sided thoracentesis on Wed 08/22/24 and had about the same amount of fluid removed. She was also noted to have R sided effusion and an add'l 400 ml was removed from  R side yesterday. Patient developed a tiny PTX and was monitored for 1 hour with a repeat cxr with no change. Over the last 24 hours she has unfortunately decompensated and had worsening sob. Home pulse ox 84 % on RA with tachypnea- Presented for further evaluation. Patient has had some hot/cold chills. No known fever.    HPI     Prior to Admission medications   Medication Sig Start Date End Date Taking? Authorizing Provider  albuterol  (VENTOLIN  HFA) 108 (90 Base) MCG/ACT inhaler Inhale 2 puffs into the lungs every 4 (four) hours as needed for shortness of breath (Asthma). 05/30/22   [provider]  alendronate  (FOSAMAX ) 70 MG tablet Take 1 tablet (70 mg total) by mouth once a week. Take with a full glass of water  on an empty stomach. 12/21/23   Gudena, Vinay, MD  aspirin 81 MG tablet Take 81 mg by mouth daily.      [provider]  dexamethasone  (DECADRON ) 4 MG tablet Take 1 tablets  by mouth daily for 3 days starting the day after chemotherapy. Take with food. 07/25/24   Gudena, Vinay, MD  diltiazem  (CARDIZEM  CD) 120 MG 24 hr capsule Take 120 mg by mouth daily.    [provider]  levothyroxine  (SYNTHROID ) 75 MCG tablet Take 75 mcg by mouth daily before breakfast.    [provider]  lidocaine -prilocaine  (EMLA ) cream Apply to affected area once 07/25/24    Gudena, Vinay, MD  Multiple Vitamins-Minerals (MULTIVITAMIN WITH MINERALS) tablet Take 1 tablet by mouth daily.    [provider]  ondansetron  (ZOFRAN ) 8 MG tablet Take 1 tablet (8 mg total) by mouth every 8 (eight) hours as needed for nausea or vomiting. Start on the third day after chemotherapy. 07/25/24   Odean Potts, MD  OVER THE COUNTER MEDICATION Take 1 capsule by mouth 2 (two) times daily. copaiba softgels    [provider]  oxyCODONE  (OXY IR/ROXICODONE ) 5 MG immediate release tablet Take 1 tablet (5 mg total) by mouth every 6 (six) hours as needed for severe pain (pain score 7-10). 07/30/24   Gudena, Vinay, MD  prochlorperazine  (COMPAZINE ) 10 MG tablet Take 1 tablet (10 mg total) by mouth every 6 (six) hours as needed for nausea or vomiting. 08/14/24   Odean Potts, MD  PULMICORT  FLEXHALER 180 MCG/ACT inhaler Inhale 1 puff into the lungs daily.    [provider]    Allergies: Penicillins    Review of Systems  Updated Vital Signs BP 102/80   Pulse (!) 106   Temp 98.1 F (36.7 C) (Oral)   Resp (!) 23   SpO2 97%   Physical Exam Vitals and nursing note reviewed.  Constitutional:      General: She is not in acute distress.    Appearance: She is well-developed. She is not diaphoretic.  HENT:     Head: Normocephalic and atraumatic.     Right Ear: External ear normal.     Left Ear: External ear normal.     Nose: Nose normal.     Mouth/Throat:     Mouth: Mucous membranes are moist.  Eyes:     General: No scleral icterus.    Conjunctiva/sclera: Conjunctivae normal.  Cardiovascular:     Rate and Rhythm: Regular rhythm. Tachycardia present.     Heart sounds: Normal heart sounds. No murmur heard.    No friction rub. No gallop.  Pulmonary:     Effort: Pulmonary effort is normal. Tachypnea present. No respiratory distress.     Breath sounds: Decreased air movement present. Examination of the left-lower field reveals decreased breath sounds.  Decreased breath sounds present.  Abdominal:     General: Bowel sounds are normal. There is no distension.     Palpations: Abdomen is soft. There is no mass.     Tenderness: There is no abdominal tenderness. There is no guarding.  Musculoskeletal:     Cervical back: Normal range of motion.  Skin:    General: Skin is warm and dry.  Neurological:     Mental Status: She is alert and oriented to person, place, and time.  Psychiatric:        Behavior: Behavior normal.     (all labs ordered are listed, but only abnormal results are displayed) Labs Reviewed  BASIC METABOLIC PANEL WITH GFR - Abnormal; Notable for the following components:      Result Value   CO2 19 (*)    Glucose, Bld 109 (*)    BUN 30 (*)    All other components within normal limits  CBC - Abnormal; Notable for the following components:   Platelets 536 (*)    All other components within normal limits  MRSA NEXT GEN BY PCR, NASAL  HEPARIN  LEVEL (UNFRACTIONATED)  HIV ANTIBODY (ROUTINE TESTING W REFLEX)  COMPREHENSIVE METABOLIC PANEL WITH GFR  MAGNESIUM  PHOSPHORUS  PRO BRAIN NATRIURETIC PEPTIDE  PROTIME-INR  CBC  I-STAT CHEM 8, ED  I-STAT CG4 LACTIC ACID, ED  TYPE AND SCREEN    EKG: EKG Interpretation Date/Time:  Saturday August 25 2024 12:02:42 EST Ventricular Rate:  109 PR Interval:  137 QRS Duration:  97 QT Interval:  386 QTC Calculation: 520 R Axis:   57  Text Interpretation: Sinus tachycardia Non-specific ST-t changes Confirmed by Bernard Drivers ([id number]) on 08/25/2024 1:19:38 PM  ED ECG REPORT   Date: 08/25/2024  Rate: 109  Rhythm: sinus tachycardia  QRS Axis: normal  Intervals: QT prolonged  ST/T Wave abnormalities: normal  Conduction Disutrbances:none  Narrative Interpretation:   Old EKG Reviewed: changes noted increased rate     Radiology: CT Angio Chest PE W and/or Wo Contrast Result Date: 08/25/2024 EXAM: CTA of the Chest with contrast for PE 08/25/2024 01:07:37 PM TECHNIQUE:  CTA of the chest was performed after the administration of intravenous contrast. Multiplanar reformatted images are provided for review. MIP images are provided for review. Automated exposure control, iterative reconstruction, and/or weight based adjustment of the mA/kV was utilized to reduce the radiation dose to as low as reasonably achievable. COMPARISON: Chest radiograph 08/25/2024. CT chest 07/30/2024. CLINICAL HISTORY: Pulmonary embolism (PE) suspected, high prob. FINDINGS: PULMONARY ARTERIES: Pulmonary arteries are adequately opacified for evaluation. Positive examination for multiple bilateral central and segmental pulmonary emboli including right and left main pulmonary arteries and bilateral upper and lower lobe branches. Moderate to high  clot burden. Enlarged main pulmonary artery may indicate pulmonary arterial hypertension. MEDIASTINUM: RA to RV ratio measures 2.6 suggesting likely right heart strain. There is no acute abnormality of the thoracic aorta. Calcification of the aorta. LYMPH NODES: Mediastinal and bilateral hilar lymphadenopathy. No axillary lymphadenopathy. LUNGS AND PLEURA: Patchy areas of infiltration or consolidation in the lungs. Moderate bilateral pleural effusions with basilar atelectasis or consolidation bilaterally. Small right pneumothorax as seen on prior chest radiographs pse . UPPER ABDOMEN: Limited images of the upper abdomen are unremarkable. SOFT TISSUES AND BONES: Soft tissue mass in the right chest wall measuring at least 4 x 12.1 cm in diameter, incompletely included within the field of view. This appears similar to the prior study. Previous right mastectomy. No acute bone abnormality. Thoracolumbar scoliosis convex towards the left  . NOTE CRITICAL RESULT: Critical results/urgent findings were called at 1:18 pm on 08/25/2024 by radiologist W. Okey Gravely, MD to Lavanda Lesches. IMPRESSION: 1. Multiple bilateral central and segmental pulmonary emboli with moderate to high  clot burden and CT evidence of right heart strain (RA:RV ratio 2.6). 2. Small right pneumothorax, seen on prior chest radiographs. 3. Moderate bilateral pleural effusions with basilar atelectasis or consolidation bilaterally. 4. Mediastinal and bilateral hilar lymphadenopathy. 5. Soft tissue mass in the right chest wall, incompletely included within the field of view, similar to prior study. 6. Note critical result. Critical results/urgent findings were called at 1:18 pm on 08/25/2024 by radiologist w. okey gravely, md to keyspan. Electronically signed by: Elsie Gravely MD 08/25/2024 01:26 PM EST RP Workstation: [id number]   DG Chest Portable 1 View Result Date: 08/25/2024 CLINICAL DATA:  Shortness of breath. EXAM: PORTABLE CHEST 1 VIEW COMPARISON:  Chest radiograph dated 08/24/2024. FINDINGS: Small left pleural effusion and left lung base atelectasis or infiltrate. Trace right pleural effusion noted. Small right apical pneumothorax minimally increased since the prior radiograph. Stable cardiac silhouette. Degenerative changes of the spine and scoliosis. No acute osseous pathology. IMPRESSION: 1. Small right apical pneumothorax minimally increased since the prior radiograph. 2. Small left pleural effusion and left lung base atelectasis or infiltrate. Electronically Signed   By: Vanetta Chou M.D.   On: 08/25/2024 11:54   DG Chest 1 View Result Date: 08/24/2024 CLINICAL DATA:  Status post right thoracentesis with suggestion of small apical pneumothorax on immediate postprocedural chest x-ray. EXAM: DG CHEST 1V COMPARISON:  Prior chest x-ray at 1232 hours FINDINGS: Stable suggestion of a less than 5% right apical pneumothorax. Improved aeration at the right lung base. Trace residual right pleural effusion and small to moderate left pleural effusion. Left basilar atelectasis present. IMPRESSION: Stable less than 5% right apical pneumothorax. Improved aeration at the right lung base. Trace residual  right pleural effusion and small to moderate left pleural effusion. Electronically Signed   By: Marcey Moan M.D.   On: 08/24/2024 13:55   DG Chest Port 1 View Result Date: 08/24/2024 CLINICAL DATA:  Status post right-sided thoracentesis. EXAM: PORTABLE CHEST 1 VIEW COMPARISON:  One view chest 08/22/2024 FINDINGS: Interval decrease right-sided pleural effusion with a small residual effusion still present. Tiny apical pneumothorax. Left-sided basilar atelectasis and pleural effusion still present. IMPRESSION: Tiny apical pneumothorax. Electronically Signed   By: Cordella Banner   On: 08/24/2024 13:14   US  THORACENTESIS ASP PLEURAL SPACE W/IMG GUIDE Result Date: 08/24/2024 INDICATION: 68 year old female with history of recurrent pleural effusion. Request for therapeutic right thoracentesis. EXAM: ULTRASOUND GUIDED RIGHT THORACENTESIS MEDICATIONS: 10 mL 1% lidocaine . COMPLICATIONS: None immediate.  PROCEDURE: An ultrasound guided thoracentesis was thoroughly discussed with the patient and questions answered. The benefits, risks, alternatives and complications were also discussed. The patient understands and wishes to proceed with the procedure. Written consent was obtained. Ultrasound was performed to localize and mark an adequate pocket of fluid in the right chest. The area was then prepped and draped in the normal sterile fashion. 1% Lidocaine  was used for local anesthesia. Under ultrasound guidance a 6 Fr Safe-T-Centesis catheter was introduced. Thoracentesis was performed. The catheter was removed and a dressing applied. FINDINGS: A total of approximately 400 mL of cloudy, yellow fluid was removed. Samples were sent to the laboratory as requested by the clinical team. IMPRESSION: Successful ultrasound guided right thoracentesis yielding 400 mL of pleural fluid. Performed by: Kacie Matthews PA-C Electronically Signed   By: Cordella Banner   On: 08/24/2024 12:52     .Critical Care  Performed by:  Arloa Chroman, PA-C Authorized by: Arloa Chroman, PA-C   Critical care provider statement:    Critical care time (minutes):  75   Critical care time was exclusive of:  Separately billable procedures and treating other patients   Critical care was necessary to treat or prevent imminent or life-threatening deterioration of the following conditions:  Respiratory failure and cardiac failure (Massive PEs- Right heart strain)   Critical care was time spent personally by me on the following activities:  Development of treatment plan with patient or surrogate, discussions with consultants, evaluation of patient's response to treatment, examination of patient, ordering and review of laboratory studies, ordering and review of radiographic studies, ordering and performing treatments and interventions, pulse oximetry, re-evaluation of patient's condition and review of old charts    Medications Ordered in the ED  heparin  ADULT infusion 100 units/mL (25000 units/250mL) (950 Units/hr Intravenous New Bag/Given 08/25/24 1350)  Chlorhexidine  Gluconate Cloth 2 % PADS 6 each (has no administration in time range)  docusate sodium  (COLACE) capsule 100 mg (has no administration in time range)  polyethylene glycol (MIRALAX  / GLYCOLAX ) packet 17 g (has no administration in time range)  pantoprazole  (PROTONIX ) EC tablet 40 mg (has no administration in time range)  sodium chloride  0.9 % bolus 1,000 mL (1,000 mLs Intravenous New Bag/Given 08/25/24 1254)  iohexol  (OMNIPAQUE ) 350 MG/ML injection 75 mL (75 mLs Intravenous Contrast Given 08/25/24 1259)  heparin  bolus via infusion 2,000 Units (2,000 Units Intravenous Bolus from Bag 08/25/24 1351)    Clinical Course as of 08/25/24 1454  Sat Aug 25, 2024  1316 I visualized and interpreted CT angiogram which shows bilateral pulmonary emboli both segmental and subsegmental.  Awaiting radiology confirmation.  I have ordered heparin  [AH]  1317 BUN(!): 30 [AH]  1317 Basic  metabolic panel(!) BUN 30 mild dehydration otherwise labs are unremarkable [AH]  1324 Case discussed with Dr. Mannie of radiology reports massive bilateral PEs right heart strain with a 2.6 RV ratio.  Call placed to critical care [AH]  1336 Case discussed with Dr. Deedee who recommends consultation with IR for potential thrombectomy. He will consult on the patient  [AH]  1339 Case disusse with Dr. Philip - he feels the patient would make a good candidate for Thrombectomy.  [AH]  1449 Plan is for patient to have thrombectomy here at [facility]. Dr. Catherine will admit for continued monitoring to the ICU [AH]    Clinical Course User Index [AH] Janalynn Eder, PA-C  Medical Decision Making 68 year old female with past medical history of stage IV breast cancer, known axillary mass, thoracentesis x 2 within the last 72 hours, known small right sided pneumothorax status post thoracentesis yesterday who presents with hypoxia, tachypnea, tachycardia and feelings of shortness of breath. Torrential diagnosis includes recurrent pleural effusion, worsening pneumothorax, empyema, worsening tumor burden, pulmonary embolus.  I ordered and reviewed labs as per ED course.  I ordered visualized and interpreted imaging including 1 view chest x-ray which was negative for infection as there small right apical pneumothorax and small left pleural effusion. I also visualized and interpreted CT angiogram as per ED course.  Patient continues to need supplemental oxygen for hypoxic respiratory failure in the setting of acute pulmonary embolus. She continues to have tachycardia and tachypnea with 2.6 RV ratio due to clot burden and right heart strain.  Patient will be admitted to the ICU for monitoring s/p Thrombectomy. Inventions include IV heparin  and oxygen support.  Amount and/or Complexity of Data Reviewed Labs: ordered. Decision-making details documented in ED Course. Radiology:  ordered and independent interpretation performed. Decision-making details documented in ED Course.    Details: Labs reassuring reviewed per ED course ECG/medicine tests: ordered.  Risk Prescription drug management. Drug therapy requiring intensive monitoring for toxicity. Decision regarding hospitalization. Emergency major surgery. Risk Details: Bilateral massive PEs with right heart strain and hypoxic respiratory failure in the setting of PE        Final diagnoses:  Other acute pulmonary embolism with acute cor pulmonale (HCC)  Acute respiratory failure with hypoxia [facility])    ED Discharge Orders     None          Arloa Chroman, PA-C 08/25/24 1454    Bernard Drivers, MD 08/28/24 [phone]

## 2024-08-25 NOTE — Progress Notes (Signed)
 Rapid Response Team at the bedside. Pt was cardioverted.

## 2024-08-25 NOTE — Progress Notes (Signed)
 Responded to code blue in IR, bagged patient with ambu bag on 15L for approximately 3 minutes. Pt was cardioverted and is now stable.

## 2024-08-25 NOTE — Consult Note (Signed)
 Chief Complaint: Patient was seen in consultation today for pulmonary embolism.  Referring Physician(s): Arloa Chroman, PA-C  Supervising Physician: Philip Cornet  Patient Status: [facility] - ED  History of Present Illness: Caitlin [name] is a 68 y.o. female with a past medical history significant for scoliosis, hypothyroidism, asthma, malignant pleural effusion, metastatic breast cancer who presented to [facility] ED today with complaints of worsening dyspnea and hypoxia. Caitlin [name] reported that Caitlin [name] began to feel short of breath about 3 weeks ago and was found to have a pleural effusion. Caitlin [name] underwent thoracentesis in IR on 10/29 (L), 11/19 (L) and 11/21 (R) - following the thoracentesis on 11/21 Caitlin [name] was noted to have a small apical pneumothorax. At the time, Caitlin [name] was asymptomatic and follow up CXR showed stable < 5% apical pneumothorax so Caitlin [name] was discharged home with ED return precautions. Over the course of yesterday evening and this morning Caitlin [name] developed worsening dyspnea, elevated heart rate, hypoxia and chills and presented to the ED for further evaluation. On arrival Caitlin [name] was noted to be tachypneic, hypotensive and hypoxic (84% on RA) and was promptly taken for CTA chesrt which showed:  1. Multiple bilateral central and segmental pulmonary emboli with moderate to high clot burden and CT evidence of right heart strain (RA:RV ratio 2.6). 2. Small right pneumothorax, seen on prior chest radiographs. 3. Moderate bilateral pleural effusions with basilar atelectasis or consolidation bilaterally. 4. Mediastinal and bilateral hilar lymphadenopathy. 5. Soft tissue mass in the right chest wall, incompletely included within the field of view, similar to prior study. 6. Note critical result. Critical results/urgent findings were called at 1:18 pm on 08/25/2024 by radiologist w. okey gravely, md to keyspan.  Caitlin [name] was started on heparin  gtt and given the clot burden/right heart strain, IR was  consulted for possible PE thrombectomy.  Patient seen in the ED, family at bedside, Caitlin [name] complains of dyspnea - particularly with laying flat, concerned Caitlin [name] will not be able to tolerate laying down for procedure both due to dyspnea and kyphosis. Discussed use of wedge/propping for patient comfort and Caitlin [name] thinks Caitlin [name] would be able to tolerate that. Reviewed indications, risks, benefits of pulmonary angiogram with mechanical thrombectomy - Caitlin [name] is agreeable to proceed.  Advance Care Plan: The advanced care plan/surrogate decision maker was discussed at the time of visit and documented in the medical record. Patient desires FULL CODE.  Past Medical History:  Diagnosis Date   Asthma    Breast cancer (HCC)    right   Complication of anesthesia    Dyspnea    Dysrhythmia    2012   Headache    History of kidney stones    passed   Hypothyroidism    Pneumonia 01/26/2023   PONV (postoperative nausea and vomiting)    rt breast ca 08/2022   Right Breast   Scoliosis    Thyroid  disease     Past Surgical History:  Procedure Laterality Date   APPENDECTOMY     AXILLARY SENTINEL NODE BIOPSY Right 10/14/2022   Procedure: RIGHT AXILLARY SENTINEL NODE BIOPSY;  Surgeon: Ebbie Cough, MD;  Location: [facility] OR;  Service: General;  Laterality: Right;   BREAST BIOPSY Right 09/15/2022   US  RT BREAST BX W LOC DEV 1ST LESION IMG BX SPEC US  GUIDE 09/15/2022 [facility]   BREAST BIOPSY  10/12/2022   US  RT RADIOACTIVE SEED LOC 10/12/2022 [facility]   BREAST LUMPECTOMY WITH RADIOACTIVE SEED AND SENTINEL LYMPH NODE BIOPSY Right 10/14/2022   Procedure:  RIGHT BREAST LUMPECTOMY WITH RADIOACTIVE SEED;  Surgeon: Ebbie Cough, MD;  Location: [facility] OR;  Service: General;  Laterality: Right;   COLONOSCOPY     LAPAROSCOPIC APPENDECTOMY     MASTECTOMY Right    PORT-A-CATH REMOVAL Left 05/03/2023   Procedure: REMOVAL PORT-A-CATH;  Surgeon: Ebbie Cough, MD;  Location: [facility] OR;  Service: General;   Laterality: Left;   PORT-A-CATH REMOVAL Left 12/22/2023   Procedure: REMOVAL PORT-A-CATH;  Surgeon: Ebbie Cough, MD;  Location: [facility];  Service: General;  Laterality: Left;   PORTACATH PLACEMENT Left 10/14/2022   Procedure: INSERTION PORT-A-CATH;  Surgeon: Ebbie Cough, MD;  Location: [facility] OR;  Service: General;  Laterality: Left;   PORTACATH PLACEMENT Left 05/19/2023   Procedure: PORT PLACEMENT WITH ULTRASOUND GUIDANCE;  Surgeon: Ebbie Cough, MD;  Location: [facility];  Service: General;  Laterality: Left;   RE-EXCISION OF BREAST LUMPECTOMY Right 11/08/2022   Procedure: RE-EXCISION OF RIGHT BREAST LUMPECTOMY;  Surgeon: Ebbie Cough, MD;  Location:  [facility];  Service: General;  Laterality: Right;   RE-EXCISION OF BREAST LUMPECTOMY Right 11/18/2022   Procedure: RE-EXCISION OF RIGHT BREAST LUMPECTOMY;  Surgeon: Ebbie Cough, MD;  Location: [facility] OR;  Service: General;  Laterality: Right;   SIMPLE MASTECTOMY WITH AXILLARY SENTINEL NODE BIOPSY Right 05/03/2023   Procedure: RIGHT MASTECTOMY;  Surgeon: Ebbie Cough, MD;  Location: [facility] OR;  Service: General;  Laterality: Right;  GEN & PEC BLOCK   WISDOM TOOTH EXTRACTION      Allergies: Penicillins  Medications: Prior to Admission medications   Medication Sig Start Date End Date Taking? Authorizing Provider  albuterol  (VENTOLIN  HFA) 108 (90 Base) MCG/ACT inhaler Inhale 2 puffs into the lungs every 4 (four) hours as needed for shortness of breath (Asthma). 05/30/22   [provider]  alendronate  (FOSAMAX ) 70 MG tablet Take 1 tablet (70 mg total) by mouth once a week. Take with a full glass of water  on an empty stomach. 12/21/23   Gudena, Vinay, MD  aspirin 81 MG tablet Take 81 mg by mouth daily.      [provider]  dexamethasone  (DECADRON ) 4 MG tablet Take 1 tablets  by mouth daily for 3 days starting the day after chemotherapy. Take with food.  07/25/24   Gudena, Vinay, MD  diltiazem  (CARDIZEM  CD) 120 MG 24 hr capsule Take 120 mg by mouth daily.    [provider]  levothyroxine  (SYNTHROID ) 75 MCG tablet Take 75 mcg by mouth daily before breakfast.    [provider]  lidocaine -prilocaine  (EMLA ) cream Apply to affected area once 07/25/24   Gudena, Vinay, MD  Multiple Vitamins-Minerals (MULTIVITAMIN WITH MINERALS) tablet Take 1 tablet by mouth daily.    [provider]  ondansetron  (ZOFRAN ) 8 MG tablet Take 1 tablet (8 mg total) by mouth every 8 (eight) hours as needed for nausea or vomiting. Start on the third day after chemotherapy. 07/25/24   Odean Potts, MD  OVER THE COUNTER MEDICATION Take 1 capsule by mouth 2 (two) times daily. copaiba softgels    [provider]  oxyCODONE  (OXY IR/ROXICODONE ) 5 MG immediate release tablet Take 1 tablet (5 mg total) by mouth every 6 (six) hours as needed for severe pain (pain score 7-10). 07/30/24   Gudena, Vinay, MD  prochlorperazine  (COMPAZINE ) 10 MG tablet Take 1 tablet (10 mg total) by mouth every 6 (six) hours as needed for nausea or vomiting. 08/14/24   Odean Potts, MD  PULMICORT  [facility] 180 MCG/ACT inhaler Inhale  1 puff into the lungs daily.    [provider]     Family History  Problem Relation Age of Onset   Thyroid  disease Mother    Prostate cancer Father 52       metastatic   Hypertension Father    Thyroid  disease Sister        twin sister age 67   Asthma Sister    Prostate cancer Brother 20   Heart attack Maternal Grandmother        died age 88's   Heart attack Maternal Grandfather        died age 57's   Clotting disorder Paternal Grandmother        cerebral hemorrage    Social History   Socioeconomic History   Marital status: Married    Spouse name: Not on file   Number of children: Not on file   Years of education: Not on file   Highest education level: Not on file  Occupational History   Not on file  Tobacco Use    Smoking status: Never   Smokeless tobacco: Not on file  Vaping Use   Vaping status: Never Used  Substance and Sexual Activity   Alcohol use: Not Currently   Drug use: Never   Sexual activity: Not Currently  Other Topics Concern   Not on file  Social History Narrative   Not on file   Social Drivers of Health   Financial Resource Strain: Not on file  Food Insecurity: No Food Insecurity (05/27/2023)   Hunger Vital Sign    Worried About Running Out of Food in the Last Year: Never true    Ran Out of Food in the Last Year: Never true  Transportation Needs: No Transportation Needs (05/27/2023)   PRAPARE - Transportation    Lack of Transportation (Medical): No    Lack of Transportation (Non-Medical): No  Physical Activity: Sufficiently Active (06/19/2020)   Received from [facility]   Exercise Vital Sign    On average, how many days per week do you engage in moderate to strenuous exercise (like a brisk walk)?: 7 days    On average, how many minutes do you engage in exercise at this level?: 30 min  Stress: Not on file  Social Connections: Not on file     Review of Systems: A 12 point ROS discussed and pertinent positives are indicated in the HPI above.  All other systems are negative.  Review of Systems  Constitutional:  Positive for appetite change (diminished), chills and fatigue. Negative for fever.  Respiratory:  Positive for shortness of breath. Negative for cough.   Cardiovascular:  Negative for chest pain.  Gastrointestinal:  Negative for abdominal pain, diarrhea, nausea and vomiting.  Neurological:  Negative for dizziness, syncope and headaches.    Vital Signs: BP 102/80   Pulse (!) 106   Temp 98.1 F (36.7 C) (Oral)   Resp (!) 23   SpO2 97%   Physical Exam Vitals and nursing note reviewed.  Constitutional:      General: Caitlin [name] is not in acute distress.    Comments: Sitting up in bed, able to talk in complete sentences.   HENT:     Head: Normocephalic.      Mouth/Throat:     Mouth: Mucous membranes are moist.     Pharynx: Oropharynx is clear. No oropharyngeal exudate or posterior oropharyngeal erythema.  Cardiovascular:     Rate and Rhythm: Regular rhythm. Tachycardia present.  Pulmonary:  Effort: Pulmonary effort is normal.  Abdominal:     Palpations: Abdomen is soft.  Skin:    General: Skin is warm and dry.     Coloration: Skin is not pale.  Neurological:     Mental Status: Caitlin [name] is alert and oriented to person, place, and time.  Psychiatric:        Mood and Affect: Mood normal.        Behavior: Behavior normal.        Thought Content: Thought content normal.        Judgment: Judgment normal.      MD Evaluation Airway: WNL Heart: WNL Abdomen: WNL Chest/ Lungs: WNL ASA  Classification: 3 Mallampati/Airway Score: Two   Imaging: CT Angio Chest PE W and/or Wo Contrast Result Date: 08/25/2024 EXAM: CTA of the Chest with contrast for PE 08/25/2024 01:07:37 PM TECHNIQUE: CTA of the chest was performed after the administration of intravenous contrast. Multiplanar reformatted images are provided for review. MIP images are provided for review. Automated exposure control, iterative reconstruction, and/or weight based adjustment of the mA/kV was utilized to reduce the radiation dose to as low as reasonably achievable. COMPARISON: Chest radiograph 08/25/2024. CT chest 07/30/2024. CLINICAL HISTORY: Pulmonary embolism (PE) suspected, high prob. FINDINGS: PULMONARY ARTERIES: Pulmonary arteries are adequately opacified for evaluation. Positive examination for multiple bilateral central and segmental pulmonary emboli including right and left main pulmonary arteries and bilateral upper and lower lobe branches. Moderate to high clot burden. Enlarged main pulmonary artery may indicate pulmonary arterial hypertension. MEDIASTINUM: RA to RV ratio measures 2.6 suggesting likely right heart strain. There is no acute abnormality of the thoracic aorta.  Calcification of the aorta. LYMPH NODES: Mediastinal and bilateral hilar lymphadenopathy. No axillary lymphadenopathy. LUNGS AND PLEURA: Patchy areas of infiltration or consolidation in the lungs. Moderate bilateral pleural effusions with basilar atelectasis or consolidation bilaterally. Small right pneumothorax as seen on prior chest radiographs pse . UPPER ABDOMEN: Limited images of the upper abdomen are unremarkable. SOFT TISSUES AND BONES: Soft tissue mass in the right chest wall measuring at least 4 x 12.1 cm in diameter, incompletely included within the field of view. This appears similar to the prior study. Previous right mastectomy. No acute bone abnormality. Thoracolumbar scoliosis convex towards the left  . NOTE CRITICAL RESULT: Critical results/urgent findings were called at 1:18 pm on 08/25/2024 by radiologist W. Okey Gravely, MD to Caitlin [name]. IMPRESSION: 1. Multiple bilateral central and segmental pulmonary emboli with moderate to high clot burden and CT evidence of right heart strain (RA:RV ratio 2.6). 2. Small right pneumothorax, seen on prior chest radiographs. 3. Moderate bilateral pleural effusions with basilar atelectasis or consolidation bilaterally. 4. Mediastinal and bilateral hilar lymphadenopathy. 5. Soft tissue mass in the right chest wall, incompletely included within the field of view, similar to prior study. 6. Note critical result. Critical results/urgent findings were called at 1:18 pm on 08/25/2024 by radiologist w. okey gravely, md to keyspan. Electronically signed by: Elsie Gravely MD 08/25/2024 01:26 PM EST RP Workstation: [id number]   DG Chest Portable 1 View Result Date: 08/25/2024 CLINICAL DATA:  Shortness of breath. EXAM: PORTABLE CHEST 1 VIEW COMPARISON:  Chest radiograph dated 08/24/2024. FINDINGS: Small left pleural effusion and left lung base atelectasis or infiltrate. Trace right pleural effusion noted. Small right apical pneumothorax minimally increased  since the prior radiograph. Stable cardiac silhouette. Degenerative changes of the spine and scoliosis. No acute osseous pathology. IMPRESSION: 1. Small right apical pneumothorax minimally increased since the prior  radiograph. 2. Small left pleural effusion and left lung base atelectasis or infiltrate. Electronically Signed   By: Vanetta Chou M.D.   On: 08/25/2024 11:54   DG Chest 1 View Result Date: 08/24/2024 CLINICAL DATA:  Status post right thoracentesis with suggestion of small apical pneumothorax on immediate postprocedural chest x-ray. EXAM: DG CHEST 1V COMPARISON:  Prior chest x-ray at 1232 hours FINDINGS: Stable suggestion of a less than 5% right apical pneumothorax. Improved aeration at the right lung base. Trace residual right pleural effusion and small to moderate left pleural effusion. Left basilar atelectasis present. IMPRESSION: Stable less than 5% right apical pneumothorax. Improved aeration at the right lung base. Trace residual right pleural effusion and small to moderate left pleural effusion. Electronically Signed   By: Marcey Moan M.D.   On: 08/24/2024 13:55   DG Chest Port 1 View Result Date: 08/24/2024 CLINICAL DATA:  Status post right-sided thoracentesis. EXAM: PORTABLE CHEST 1 VIEW COMPARISON:  One view chest 08/22/2024 FINDINGS: Interval decrease right-sided pleural effusion with a small residual effusion still present. Tiny apical pneumothorax. Left-sided basilar atelectasis and pleural effusion still present. IMPRESSION: Tiny apical pneumothorax. Electronically Signed   By: Cordella Banner   On: 08/24/2024 13:14   US  THORACENTESIS ASP PLEURAL SPACE W/IMG GUIDE Result Date: 08/24/2024 INDICATION: 68 year old female with history of recurrent pleural effusion. Request for therapeutic right thoracentesis. EXAM: ULTRASOUND GUIDED RIGHT THORACENTESIS MEDICATIONS: 10 mL 1% lidocaine . COMPLICATIONS: None immediate. PROCEDURE: An ultrasound guided thoracentesis was thoroughly  discussed with the patient and questions answered. The benefits, risks, alternatives and complications were also discussed. The patient understands and wishes to proceed with the procedure. Written consent was obtained. Ultrasound was performed to localize and mark an adequate pocket of fluid in the right chest. The area was then prepped and draped in the normal sterile fashion. 1% Lidocaine  was used for local anesthesia. Under ultrasound guidance a 6 Fr Safe-T-Centesis catheter was introduced. Thoracentesis was performed. The catheter was removed and a dressing applied. FINDINGS: A total of approximately 400 mL of cloudy, yellow fluid was removed. Samples were sent to the laboratory as requested by the clinical team. IMPRESSION: Successful ultrasound guided right thoracentesis yielding 400 mL of pleural fluid. Performed by: Kacie Matthews PA-C Electronically Signed   By: Cordella Banner   On: 08/24/2024 12:52   US  THORACENTESIS ASP PLEURAL SPACE W/IMG GUIDE Result Date: 08/22/2024 INDICATION: Patient with history of metastatic breast cancer, bilateral pleural effusions; request received for therapeutic left thoracentesis. EXAM: ULTRASOUND GUIDED THERAPEUTIC LEFT THORACENTESIS MEDICATIONS: 8 mL 1% lidocaine  with epinephrine  to skin/subcutaneous tissue COMPLICATIONS: None immediate. PROCEDURE: An ultrasound guided thoracentesis was thoroughly discussed with the patient and questions answered. The benefits, risks, alternatives and complications were also discussed. The patient understands and wishes to proceed with the procedure. Written consent was obtained. Ultrasound was performed to localize and mark an adequate pocket of fluid in the left chest. The area was then prepped and draped in the normal sterile fashion. 1% Lidocaine  was used for local anesthesia. Under ultrasound guidance a 6 Fr Safe-T-Centesis catheter was introduced. Thoracentesis was performed. The catheter was removed and a dressing applied.  FINDINGS: A total of approximately 1.1 liters of yellow fluid was removed. Due to patient coughing only the above amount of fluid was removed today. IMPRESSION: Successful ultrasound guided therapeutic left thoracentesis yielding 1.1 liters of pleural fluid. Performed by: Franky Rakers, PA-C Electronically Signed   By: JONETTA Faes M.D.   On: 08/22/2024 17:02  DG Chest 1 View Result Date: 08/22/2024 CLINICAL DATA:  Status post left-sided thoracentesis. EXAM: CHEST  1 VIEW COMPARISON:  Chest CT dated 08/21/2024. FINDINGS: Interval decrease in the size of the left pleural effusion post thoracentesis. No pneumothorax. Left lung base atelectasis. Small right pleural effusion is noted. Stable cardiac silhouette. No acute osseous pathology. IMPRESSION: Interval decrease in the size of the left pleural effusion post thoracentesis. No pneumothorax. Electronically Signed   By: Vanetta Chou M.D.   On: 08/22/2024 13:21   DG Chest 2 View Result Date: 08/21/2024 CLINICAL DATA:  Shortness of breath.  Pleural effusions. EXAM: CHEST - 2 VIEW COMPARISON:  08/09/2024. FINDINGS: Persistent moderate left and small right pleural effusions with associated bibasilar opacities, favored to reflect atelectasis and/or infiltrate. No overt pulmonary edema. No pneumothorax. Cardiac silhouette is unchanged. Diffuse osseous mineralization. Scoliotic curvature of the spine. No acute osseous abnormality. IMPRESSION: Persistent moderate left and small right pleural effusions with associated bibasilar opacities, favored to reflect atelectasis and/or infiltrate. Electronically Signed   By: Harrietta Sherry M.D.   On: 08/21/2024 13:17   DG Chest 2 View Result Date: 08/09/2024 EXAM: 2 VIEW(S) XRAY OF THE CHEST 08/09/2024 09:37:00 AM COMPARISON: 8 days ago. CLINICAL HISTORY: shortness of breath FINDINGS: LUNGS AND PLEURA: Small bilateral pleural effusions are noted with adjacent subsegmental atelectasis. No pulmonary edema. No pneumothorax.  HEART AND MEDIASTINUM: No acute abnormality of the cardiac and mediastinal silhouettes. BONES AND SOFT TISSUES: Moderate dextroscoliosis of the thoracic spine is noted. IMPRESSION: 1. Small bilateral pleural effusions with adjacent subsegmental atelectasis. 2. Moderate dextroscoliosis of the thoracic spine. Electronically signed by: Lynwood Seip MD 08/09/2024 10:34 AM EST RP Workstation: [id number]   DG Chest 1 View Result Date: 08/01/2024 CLINICAL DATA:  Status post left-sided thoracentesis. EXAM: DG CHEST 1V COMPARISON:  Chest radiograph dated 05/19/2023. FINDINGS: Small right pleural effusion and right lung base atelectasis or infiltrate. Linear left lung base atelectasis/scarring. No pneumothorax. The cardiac silhouette is within normal limits. Osteopenia with degenerative changes spine and scoliosis. No acute osseous pathology. IMPRESSION: Small right pleural effusion and right lung base atelectasis or infiltrate. No pneumothorax. Electronically Signed   By: Vanetta Chou M.D.   On: 08/01/2024 15:27   US  THORACENTESIS ASP PLEURAL SPACE W/IMG GUIDE Result Date: 08/01/2024 INDICATION: Patient with history of metastatic breast cancer, bilateral pleural effusions, left-greater-than-right. Request received for therapeutic left thoracentesis. EXAM: ULTRASOUND GUIDED THERAPEUTIC LEFT THORACENTESIS MEDICATIONS: 8 mL 1% lidocaine  with epinephrine  to skin/subcutaneous tissue COMPLICATIONS: None immediate. PROCEDURE: An ultrasound guided thoracentesis was thoroughly discussed with the patient and questions answered. The benefits, risks, alternatives and complications were also discussed. The patient understands and wishes to proceed with the procedure. Written consent was obtained. Ultrasound was performed to localize and mark an adequate pocket of fluid in the left chest. The area was then prepped and draped in the normal sterile fashion. 1% Lidocaine  was used for local anesthesia. Under ultrasound guidance a 6  Fr Safe-T-Centesis catheter was introduced. Thoracentesis was performed. The catheter was removed and a dressing applied. FINDINGS: A total of approximately 1.1 liters of hazy, yellow fluid was removed. IMPRESSION: Successful ultrasound guided therapeutic left thoracentesis yielding 1.1 liters of pleural fluid. Performed by: Franky Rakers, PA-C Electronically Signed   By: Juliene Balder M.D.   On: 08/01/2024 15:21   NM Bone Scan Whole Body Result Date: 08/01/2024 CLINICAL DATA:  Metastatic right breast cancer. EXAM: NUCLEAR MEDICINE WHOLE BODY BONE SCAN TECHNIQUE: Whole body anterior and posterior images were obtained  approximately 3 hours after intravenous injection of radiopharmaceutical. RADIOPHARMACEUTICALS:  19.2 mCi Technetium-87m MDP IV COMPARISON:  CT scan 07/30/2024, PET-CT 02/24/2024 and whole-body bone scan 11/22/2023 FINDINGS: Stable areas of degenerative type uptake noted mainly involving the spine, sternoclavicular joints and feet. No findings suspicious for osseous metastatic disease. IMPRESSION: Negative whole body bone scan for metastatic bone disease. No change since prior study. Electronically Signed   By: MYRTIS Stammer M.D.   On: 08/01/2024 11:25   CT CHEST ABDOMEN PELVIS W CONTRAST Result Date: 08/01/2024 CLINICAL DATA:  Metastatic disease evaluation. * Tracking Code: BO * EXAM: CT CHEST, ABDOMEN, AND PELVIS WITH CONTRAST TECHNIQUE: Multidetector CT imaging of the chest, abdomen and pelvis was performed following the standard protocol during bolus administration of intravenous contrast. RADIATION DOSE REDUCTION: This exam was performed according to the departmental dose-optimization program which includes automated exposure control, adjustment of the mA and/or kV according to patient size and/or use of iterative reconstruction technique. CONTRAST:  OMNIPAQUE  IOHEXOL  300 MG/ML  SOLN COMPARISON:  CT scan chest, abdomen and pelvis from 05/22/2024. FINDINGS: CT CHEST FINDINGS  Cardiovascular: Normal cardiac size. No pericardial effusion. No aortic aneurysm. There are coronary artery calcifications, in keeping with coronary artery disease. There are also minimal peripheral atherosclerotic vascular calcifications of thoracic aorta and its major branches. Mediastinum/Nodes: Visualized thyroid  gland appears grossly unremarkable. There is new irregular soft tissue in the anterosuperior mediastinum, highly concerning for metastatic lymphadenopathy. There is interval increase in the size and number of lower cervical lymph nodes for example, left cervical lymph node measuring up to 1.5 x 1.8 cm (series 6, image 22). There is also significant interval increase in the paratracheal, prevascular, subcarinal and left hilar lymph nodes, compatible with metastasis. There is also new, heterogeneous irregular mass predominantly centered in the right-sided latissimus dorsi muscle, which reaches up to the intercostal muscles and is compatible with metastasis. The mass measures at least 4.0 x 9.0 cm orthogonally on axial plane. Craniocaudally the mass measures up to 12.5 cm. There is associated asymmetric irregular thickening of the right anterolateral chest wall skin. There is also interval increase in the size in number of left axillary lymph nodes. Lungs/Pleura: The central tracheo-bronchial tree is patent. There is new small right and moderate left pleural effusion with associated partial collapse of the left lung lower lobe. 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 No solid mass or pneumothorax. No suspicious lung nodules. Musculoskeletal: No suspicious osseous lesions. Marked scoliotic curvature of the spine. There are mild-to-moderate degenerative changes of the thoracic spine. CT ABDOMEN PELVIS FINDINGS Hepatobiliary: The  liver is normal in size. Non-cirrhotic configuration. Redemonstration of subcapsular bilobed simple cyst in the left hepatic lobe, segment 3 measuring 1.9 x 2.6 cm. There is an additional approximately 10 x 11 mm hypoattenuating lesion in the left hepatic lobe, segment 3/4B with peripheral hyperattenuating wall this is incompletely characterized on the current exam but appears grossly similar to the prior studies. No new suspicious liver lesions seen. No intrahepatic or extrahepatic bile duct dilation. No calcified gallstones. Normal gallbladder wall thickness. No pericholecystic inflammatory changes. Pancreas: Unremarkable. No pancreatic ductal dilatation or surrounding inflammatory changes. Spleen: Within normal limits. No focal lesion. Adrenals/Urinary Tract: Adrenal glands are unremarkable. No suspicious renal mass. There is a stable 5 x 6 mm nonobstructing calculus in the left kidney lower pole calyx. No other nephroureterolithiasis or  obstructive uropathy on either side. Urinary bladder is under distended, precluding optimal assessment. However, no large mass or stones identified. No perivesical fat stranding. Stomach/Bowel: No disproportionate dilation of the small or large bowel loops. No evidence of abnormal bowel wall thickening or inflammatory changes. The appendix was not visualized; however there is no acute inflammatory process in the right lower quadrant. There is moderate stool burden. Vascular/Lymphatic: No ascites or pneumoperitoneum. No abdominal or pelvic lymphadenopathy, by size criteria. No aneurysmal dilation of the major abdominal arteries. There are moderate peripheral atherosclerotic vascular calcifications of the aorta and its major branches. Reproductive: The uterus is unremarkable. No large adnexal mass. Other: There is a tiny fat containing umbilical hernia. The soft tissues and abdominal wall are otherwise unremarkable. Musculoskeletal: No suspicious osseous lesions. There are moderate  multilevel degenerative changes in the visualized spine. Redemonstration of hemangioma in the L3 vertebral body. IMPRESSION: 1. There is new irregular soft tissue in the anterosuperior mediastinum, highly concerning for metastatic lymphadenopathy. There is also significant interval increase in the size and number of lower cervical, mediastinal, left hilar and left axillary lymph nodes, compatible with worsening metastases. 2. There is a new, large, heterogeneous, infiltrating irregular mass predominantly centered in the right-sided latissimus dorsi muscle, which reaches up to the intercostal muscles and is compatible with worsening metastases. 3. 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 4. There is new small right and moderate left pleural effusion with associated partial collapse of the left lung lower lobe. 5. No metastatic disease identified within the abdomen or pelvis. 6. Multiple other nonacute observations, as described above. Aortic Atherosclerosis (ICD10-I70.0). Electronically Signed   By: Ree Molt M.D.   On: 08/01/2024 11:15   ECHOCARDIOGRAM COMPLETE Result Date: 07/27/2024    ECHOCARDIOGRAM REPORT   Patient Name:   EMMALYNNE COURTNEY Date of Exam: 07/27/2024 Medical Rec #:  [id number]        Height:       63.0 in Accession #:    [id number]       Weight:       138.8 lb Date of Birth:  [birth year]       BSA:          1.656 m Patient Age:    67 years         BP:           128/74 mmHg Patient Gender: F                HR:           75 bpm. Exam Location:  Outpatient Procedure: 2D Echo and Strain Analysis (Both Spectral and Color Flow Doppler            were utilized during procedure). Indications:    Chemo  History:        Patient has prior history of Echocardiogram examinations.                 Signs/Symptoms:Chemo.   Sonographer:    Norleen Amour Referring Phys: [id number] MACKEY CHAD IMPRESSIONS  1. Left ventricular ejection fraction, by estimation, is 60 to 65%. Left ventricular ejection fraction by 2D MOD biplane is 60.5 %. The left ventricle has normal function. The left ventricle has no regional wall motion abnormalities. Left ventricular diastolic parameters were normal. The average left ventricular global longitudinal strain is -19.5 %. The global  longitudinal strain is normal.  2. Right ventricular systolic function is normal. The right ventricular size is normal. There is normal pulmonary artery systolic pressure. The estimated right ventricular systolic pressure is 20.0 mmHg.  3. The mitral valve is grossly normal. Trivial mitral valve regurgitation.  4. The aortic valve is tricuspid. Aortic valve regurgitation is trivial.  5. The inferior vena cava is normal in size with greater than 50% respiratory variability, suggesting right atrial pressure of 3 mmHg. Comparison(s): Changes from prior study are noted. 11/15/2022: LVEF 60-65%, GLS -20.9%. FINDINGS  Left Ventricle: Left ventricular ejection fraction, by estimation, is 60 to 65%. Left ventricular ejection fraction by 2D MOD biplane is 60.5 %. The left ventricle has normal function. The left ventricle has no regional wall motion abnormalities. The average left ventricular global longitudinal strain is -19.5 %. Strain was performed and the global longitudinal strain is normal. The left ventricular internal cavity size was normal in size. There is no left ventricular hypertrophy. Left ventricular diastolic parameters were normal. Right Ventricle: The right ventricular size is normal. No increase in right ventricular wall thickness. Right ventricular systolic function is normal. There is normal pulmonary artery systolic pressure. The tricuspid regurgitant velocity is 2.06 m/s, and  with an assumed right atrial pressure of 3 mmHg, the estimated right ventricular systolic  pressure is 20.0 mmHg. Left Atrium: Left atrial size was normal in size. Right Atrium: Right atrial size was normal in size. Pericardium: There is no evidence of pericardial effusion. Mitral Valve: The mitral valve is grossly normal. Trivial mitral valve regurgitation. Tricuspid Valve: The tricuspid valve is grossly normal. Tricuspid valve regurgitation is mild. Aortic Valve: The aortic valve is tricuspid. Aortic valve regurgitation is trivial. Pulmonic Valve: The pulmonic valve was grossly normal. Pulmonic valve regurgitation is trivial. Aorta: The aortic root and ascending aorta are structurally normal, with no evidence of dilitation. Venous: The inferior vena cava is normal in size with greater than 50% respiratory variability, suggesting right atrial pressure of 3 mmHg. IAS/Shunts: No atrial level shunt detected by color flow Doppler. Additional Comments: 3D was performed not requiring image post processing on an independent workstation and was normal.  LEFT VENTRICLE PLAX 2D                        Biplane EF (MOD) LVIDd:         3.20 cm         LV Biplane EF:   Left LVIDs:         2.10 cm                          ventricular LV PW:         0.90 cm                          ejection LV IVS:        1.00 cm                          fraction by LVOT diam:     2.00 cm                          2D MOD LV SV:         69  biplane is LV SV Index:   42                               60.5 %. LVOT Area:     3.14 cm LV IVRT:       74 msec         Diastology                                LV e' medial:    8.92 cm/s                                LV E/e' medial:  8.7 LV Volumes (MOD)               LV e' lateral:   9.68 cm/s LV vol d, MOD    55.3 ml       LV E/e' lateral: 8.1 A2C: LV vol d, MOD    82.5 ml       2D Longitudinal A4C:                           Strain LV vol s, MOD    23.6 ml       2D Strain GLS   -19.9 % A2C:                           (A4C): LV vol s, MOD    31.2 ml       2D Strain GLS    -17.4 % A4C:                           (A3C): LV SV MOD A2C:   31.7 ml       2D Strain GLS   -21.2 % LV SV MOD A4C:   82.5 ml       (A2C): LV SV MOD BP:    42.0 ml       2D Strain GLS   -19.5 %                                Avg: RIGHT VENTRICLE             IVC RV Basal diam:  4.20 cm     IVC diam: 1.10 cm RV S prime:     12.10 cm/s TAPSE (M-mode): 2.1 cm      PULMONARY VEINS                             Diastolic Velocity: 51.80 cm/s                             S/D Velocity:       0.80                             Systolic Velocity:  42.40 cm/s LEFT ATRIUM             Index        RIGHT ATRIUM  Index LA diam:        3.30 cm 1.99 cm/m   RA Area:     15.30 cm LA Vol (A2C):   56.6 ml 34.19 ml/m  RA Volume:   37.10 ml  22.41 ml/m LA Vol (A4C):   41.8 ml 25.25 ml/m LA Biplane Vol: 50.0 ml 30.20 ml/m  AORTIC VALVE             PULMONIC VALVE LVOT Vmax:   107.00 cm/s PV Vmax:       0.66 m/s LVOT Vmean:  81.500 cm/s PV Peak grad:  1.8 mmHg LVOT VTI:    0.221 m  AORTA Ao Root diam: 3.30 cm Ao Asc diam:  3.00 cm MITRAL VALVE               TRICUSPID VALVE MV Area (PHT): 3.99 cm    TR Peak grad:   17.0 mmHg MV Decel Time: 190 msec    TR Vmax:        206.00 cm/s MV E velocity: 78.00 cm/s MV A velocity: 72.40 cm/s  SHUNTS MV E/A ratio:  1.08        Systemic VTI:  0.22 m                            Systemic Diam: 2.00 cm Vinie Maxcy MD Electronically signed by Vinie Maxcy MD Signature Date/Time: 07/27/2024/3:51:31 PM    Final     Labs:  CBC: Recent Labs    05/31/24 0934 08/09/24 1016 08/20/24 0825 08/25/24 1210  WBC 5.5 9.1 4.2 5.6  HGB 12.7 10.9* 11.2* 13.1  HCT 39.0 32.5* 33.6* 39.6  PLT 279 579* 440* 536*    COAGS: No results for input(s): INR, APTT in the last 8760 hours.  BMP: Recent Labs    05/31/24 0934 08/09/24 1016 08/20/24 0825 08/25/24 1210  NA 139 135 137 137  K 4.4 4.0 3.9 4.7  CL 105 103 103 104  CO2 28 23 24  19*  GLUCOSE 82 98 82 109*  BUN 14 23 22  30*  CALCIUM  9.1 8.6* 8.6* 9.2  CREATININE 0.93 0.77 0.93 1.00  GFRNONAA >60 >60 >60 >60    LIVER FUNCTION TESTS: Recent Labs    02/15/24 0836 05/31/24 0934 08/09/24 1016 08/20/24 0825  BILITOT 0.3 0.4 0.4 0.3  AST 16 24 20 22   ALT 13 19 9 13   ALKPHOS 79 53 64 65  PROT 6.9 6.8 6.7 6.6  ALBUMIN 4.1 4.2 3.3* 3.6    TUMOR MARKERS: No results for input(s): AFPTM, CEA, CA199, CHROMGRNA in the last 8760 hours.  Assessment and Plan:   68 y/o F with history of metastatic breast cancer, recurrent pleural effusion s/p multiple thoracenteses in IR who presented to the ED today for worsening dyspnea, tachycardia and hypoxia. Caitlin [name] was found to have bilateral central and segmental PE with moderate to high clot burden and right heart strain on CT. Caitlin [name] was started on heparin  gtt and IR was consulted for possible mechanical thrombectomy. Patient history and imaging reviewed by Dr. Philip who approves procedure for today.  Discussed mechanical thrombectomy with patient and family at bedside today - they are agreeable to proceed.  PESI = 97 (intermediate risk) Hgb 13.1, plt 536, INR and troponin T pending On exam patient saturating well on room air, hypotensive, mildly tachycardic. Does not appear to be in acute distress. Last PO intake bone broth ~0730 today On ASA 81 mg  every day at home  Risks and benefits of pulmonary arteriogram with mechanical thrombectomy were discussed with the patient including, but not limited to bleeding, infection, vascular injury, contrast induced renal failure, stroke, reperfusion hemorrhage, or even death.  This interventional procedure involves the use of X-rays and because of the nature of the planned procedure, it is possible that we will have prolonged use of X-ray fluoroscopy. Potential radiation risks to you include (but are not limited to) the following: - A slightly elevated risk for cancer  several years later in life. This risk is typically less than 0.5%  percent. This risk is low in comparison to the normal incidence of human cancer, which is 33% for women and 50% for men according to the [employer]. - Radiation induced injury can include skin redness, resembling a rash, tissue breakdown / ulcers and hair loss (which can be temporary or permanent).  The likelihood of either of these occurring depends on the difficulty of the procedure and whether you are sensitive to radiation due to previous procedures, disease, or genetic conditions.  IF your procedure requires a prolonged use of radiation, you will be notified and given written instructions for further action.  It is your responsibility to monitor the irradiated area for the 2 weeks following the procedure and to notify your physician if you are concerned that you have suffered a radiation induced injury.    All of the patient's questions were answered, patient is agreeable to proceed.  Consent signed and in chart.  Thank you for this interesting consult.  I greatly enjoyed meeting Caitlin [name] and look forward to participating in their care.  A copy of this report was sent to the requesting provider on this date.  Electronically Signed: Clotilda DELENA Hesselbach, PA-C 08/25/2024, 2:36 PM   I spent a total of 40 Minutes in face to face in clinical consultation, greater than 50% of which was counseling/coordinating care for pulmonary embolism.

## 2024-08-25 NOTE — Progress Notes (Addendum)
 eLink Physician-Brief Progress Note Patient Name: Caitlin [name] DOB: 23-Jul-1956 MRN: [id number]   Date of Service  08/25/2024  HPI/Events of Note  68 year old female with a history of metastatic breast cancer who presented with submassive pulmonary embolus who had a tachyarrhythmia during IR intervention and required DC cardioversion status post thrombectomy.  Patient is saturating 100% on 4 L nasal cannula, tachypneic but blood pressure within normal limits.  Preoperative results consistent with hyperkalemia, mild anemia, and EKG with sinus tachycardia.  Chest radiograph with small right-sided pneumothorax again visualized on CT PE with PE and RV strain.  eICU Interventions  Maintain heparin  infusion, echocardiogram pending  Repeat BMP to ensure no persistent hyperkalemia  Resume home SVNs, a.m. chest radiograph to follow pneumothorax  Resume some home meds, advance diet  DVT prophylaxis with therapeutic heparin  GI prophylaxis not indicated, consider discontinuation   2135 -complaining of pain, does not take Oxy that is ordered, add acetaminophen  as needed.  Hold home Cardizem  until echocardiogram returns to avoid negative inotropy  2240 -now blood pressures are dropping to the 70s-80s systolic.  Will attempt additional 500 cc bolus, if this is ineffective, initiate peripheral norepinephrine   Intervention Category Evaluation Type: New Patient Evaluation  [name] 08/25/2024, 7:59 PM

## 2024-08-25 NOTE — H&P (Signed)
 NAME:  Caitlin [name], MRN:  [id number], DOB:  09/07/1956, LOS: 0 ADMISSION DATE:  08/25/2024, CONSULTATION DATE:  @TODAY @ REFERRING MD:  ED, CHIEF COMPLAINT:  pulmonary embolism   History of Present Illness:  Ms. 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 CCM consulted for evaluation and management.  The patient notes that she has been struggling with shortness of breath for a while which she notes was related to recurrent pleural effusion. She had 1.1 L drained from her left pleural space on 10/29 and she felt improved after that. The shortness of breath recurred and so she went again for thoracetnesis on 11/19 where another 1.1 L of pleural fluid was drained from left pleural space. She did not feel much improved this time and so another thoracentesis was done on the right side draining 400 mL. She did not feel much better and she noted worsening shortness of breath. Her husband is a family doctor and checked her SPO2 and it was around 86%. Therefore, she was brought in to the ED for evaluation. She was also noted to be tachycardic upon his evaluation. Of note, patient had a long road trip recently which may have been another provoking factor aside form hx of metastatic breast cancer.  Upon arrival to ED, HR in 100s, BP stable off pressors, SPO2 > 90% on 2 L O2 by [place] CTA Chest was ordered showing a small R apical pneumothorax, bilateral central pulmonary emboli, RV dilated suggestive of RV strain  Pertinent  Medical History   Past Medical History:  Diagnosis Date   Asthma    Breast cancer (HCC)    right   Complication of anesthesia    Dyspnea    Dysrhythmia    2012   Headache    History of kidney stones    passed   Hypothyroidism    Pneumonia 01/26/2023   PONV (postoperative nausea and vomiting)    rt breast ca 08/2022   Right Breast    Scoliosis    Thyroid  disease    Significant Hospital Events: Including procedures, antibiotic start and stop dates in addition to other pertinent events   11/22 --> ED for hypoxia and SOB --> submassive PE  Interim History / Subjective:  As noted above  Objective    Blood pressure 102/80, pulse (!) 106, temperature 98.1 F (36.7 C), temperature source Oral, resp. rate (!) 23, SpO2 97%.       No intake or output data in the 24 hours ending 08/25/24 1452 There were no vitals filed for this visit.  Examination: General: well appearing, no distress HENT: anicteric sclera, well injected conjunctivae, PERRL Lungs: CTAB Cardiovascular: tachycardic, no murmurs. POCUS evaluation shows dilated RV that appears to be dysfunctional with D shaped septum and Mconnel's sign, LV appears hyperdynamic, no pericardial effusion Abdomen: soft, non-tender Extremities: no edema Neuro: awake, alert, oriented x 3 GU: deferred.  Resolved problem list   Assessment and Plan   #Acute Hypoxic Respiratory Failure: 2/2 to below #Submassive PE: evidence of RV strain on POCUS as noted above. - Trop and BNP to assess cardiac markers - IR consult for thrombectomy - Heparin  drip - Given hx of metastatic cancer, will be on long term anticoagulation. - Admit to ICU for hemodynamic monitoring - Wean O2 as tolerated - Limited echo to assess RV function  #Stress Ulcer PPx: PPI  #  Nutrition: NPO  #VTE ppx: on heparin  as above  Disposition: ICU appropriate for submassive PE  Labs   CBC: Recent Labs  Lab 08/20/24 0825 08/25/24 1210  WBC 4.2 5.6  NEUTROABS 3.0  --   HGB 11.2* 13.1  HCT 33.6* 39.6  MCV 96.0 97.3  PLT 440* 536*    Basic Metabolic Panel: Recent Labs  Lab 08/20/24 0825 08/25/24 1210  NA 137 137  K 3.9 4.7  CL 103 104  CO2 24 19*  GLUCOSE 82 109*  BUN 22 30*  CREATININE 0.93 1.00  CALCIUM 8.6* 9.2   GFR: Estimated Creatinine Clearance: 45.2 mL/min (by C-G formula based on  SCr of 1 mg/dL). Recent Labs  Lab 08/20/24 0825 08/25/24 1210  WBC 4.2 5.6    Liver Function Tests: Recent Labs  Lab 08/20/24 0825  AST 22  ALT 13  ALKPHOS 65  BILITOT 0.3  PROT 6.6  ALBUMIN 3.6   No results for input(s): LIPASE, AMYLASE in the last 168 hours. No results for input(s): AMMONIA in the last 168 hours.  ABG No results found for: PHART, PCO2ART, PO2ART, HCO3, TCO2, ACIDBASEDEF, O2SAT   Coagulation Profile: No results for input(s): INR, PROTIME in the last 168 hours.  Cardiac Enzymes: No results for input(s): CKTOTAL, CKMB, CKMBINDEX, TROPONINI in the last 168 hours.  HbA1C: No results found for: HGBA1C  CBG: No results for input(s): GLUCAP in the last 168 hours.  Review of Systems:   Not obtained.  Past Medical History:  She,  has a past medical history of Asthma, Breast cancer (HCC), Complication of anesthesia, Dyspnea, Dysrhythmia, Headache, History of kidney stones, Hypothyroidism, Pneumonia (01/26/2023), PONV (postoperative nausea and vomiting), rt breast ca (08/2022), Scoliosis, and Thyroid  disease.   Surgical History:   Past Surgical History:  Procedure Laterality Date   APPENDECTOMY     AXILLARY SENTINEL NODE BIOPSY Right 10/14/2022   Procedure: RIGHT AXILLARY SENTINEL NODE BIOPSY;  Surgeon: Ebbie Cough, MD;  Location: [facility] OR;  Service: General;  Laterality: Right;   BREAST BIOPSY Right 09/15/2022   US  RT BREAST BX W LOC DEV 1ST LESION IMG BX SPEC US  GUIDE 09/15/2022 [facility]   BREAST BIOPSY  10/12/2022   US  RT RADIOACTIVE SEED LOC 10/12/2022 [facility]   BREAST LUMPECTOMY WITH RADIOACTIVE SEED AND SENTINEL LYMPH NODE BIOPSY Right 10/14/2022   Procedure: RIGHT BREAST LUMPECTOMY WITH RADIOACTIVE SEED;  Surgeon: Ebbie Cough, MD;  Location: [facility] OR;  Service: General;  Laterality: Right;   COLONOSCOPY     LAPAROSCOPIC APPENDECTOMY     MASTECTOMY Right    PORT-A-CATH REMOVAL Left  05/03/2023   Procedure: REMOVAL PORT-A-CATH;  Surgeon: Ebbie Cough, MD;  [facility] OR;  Service: General;  Laterality: Left;   PORT-A-CATH REMOVAL Left 12/22/2023   Procedure: REMOVAL PORT-A-CATH;  Surgeon: Ebbie Cough, MD;  Location: [facility];  Service: General;  Laterality: Left;   PORTACATH PLACEMENT Left 10/14/2022   Procedure: INSERTION PORT-A-CATH;  Surgeon: Ebbie Cough, MD;  Location: [facility] OR;  Service: General;  Laterality: Left;   PORTACATH PLACEMENT Left 05/19/2023   Procedure: PORT PLACEMENT WITH ULTRASOUND GUIDANCE;  Surgeon: Ebbie Cough, MD;  Location: [facility];  Service: General;  Laterality: Left;   RE-EXCISION OF BREAST LUMPECTOMY Right 11/08/2022   Procedure: RE-EXCISION OF RIGHT BREAST LUMPECTOMY;  Surgeon: Ebbie Cough, MD;  Location: [facility];  Service: General;  Laterality: Right;   RE-EXCISION OF BREAST LUMPECTOMY Right 11/18/2022   Procedure: RE-EXCISION OF  RIGHT BREAST LUMPECTOMY;  Surgeon: Ebbie Cough, MD;  Location: [facility] OR;  Service: General;  Laterality: Right;   SIMPLE MASTECTOMY WITH AXILLARY SENTINEL NODE BIOPSY Right 05/03/2023   Procedure: RIGHT MASTECTOMY;  Surgeon: Ebbie Cough, MD;  Location: [facility] OR;  Service: General;  Laterality: Right;  GEN & PEC BLOCK   WISDOM TOOTH EXTRACTION       Social History:   reports that she has never smoked. She does not have any smokeless tobacco history on file. She reports that she does not currently use alcohol. She reports that she does not use drugs.   Family History:  Her family history includes Asthma in her sister; Clotting disorder in her paternal grandmother; Heart attack in her maternal grandfather and maternal grandmother; Hypertension in her father; Prostate cancer (age of onset: 34) in her brother; Prostate cancer (age of onset: 57) in her father; Thyroid  disease in her mother and sister.   Allergies Allergies   Allergen Reactions   Penicillins Rash    childhood reaction     Home Medications  Prior to Admission medications   Medication Sig Start Date End Date Taking? Authorizing Provider  albuterol  (VENTOLIN  HFA) 108 (90 Base) MCG/ACT inhaler Inhale 2 puffs into the lungs every 4 (four) hours as needed for shortness of breath (Asthma). 05/30/22   [provider]  alendronate  (FOSAMAX ) 70 MG tablet Take 1 tablet (70 mg total) by mouth once a week. Take with a full glass of water  on an empty stomach. 12/21/23   Gudena, Vinay, MD  aspirin 81 MG tablet Take 81 mg by mouth daily.      [provider]  dexamethasone  (DECADRON ) 4 MG tablet Take 1 tablets  by mouth daily for 3 days starting the day after chemotherapy. Take with food. 07/25/24   Gudena, Vinay, MD  diltiazem  (CARDIZEM  CD) 120 MG 24 hr capsule Take 120 mg by mouth daily.    [provider]  levothyroxine  (SYNTHROID ) 75 MCG tablet Take 75 mcg by mouth daily before breakfast.    [provider]  lidocaine -prilocaine  (EMLA ) cream Apply to affected area once 07/25/24   Gudena, Vinay, MD  Multiple Vitamins-Minerals (MULTIVITAMIN WITH MINERALS) tablet Take 1 tablet by mouth daily.    [provider]  ondansetron  (ZOFRAN ) 8 MG tablet Take 1 tablet (8 mg total) by mouth every 8 (eight) hours as needed for nausea or vomiting. Start on the third day after chemotherapy. 07/25/24   Odean Potts, MD  OVER THE COUNTER MEDICATION Take 1 capsule by mouth 2 (two) times daily. copaiba softgels    [provider]  oxyCODONE  (OXY IR/ROXICODONE ) 5 MG immediate release tablet Take 1 tablet (5 mg total) by mouth every 6 (six) hours as needed for severe pain (pain score 7-10). 07/30/24   Odean Potts, MD  prochlorperazine  (COMPAZINE ) 10 MG tablet Take 1 tablet (10 mg total) by mouth every 6 (six) hours as needed for nausea or vomiting. 08/14/24   Odean Potts, MD  PULMICORT  FLEXHALER 180 MCG/ACT inhaler Inhale 1  puff into the lungs daily.    [provider]     Due to a high probability of clinically significant, life threatening deterioration, the patient required my highest level of preparedness to intervene emergently and I personally spent this critical care time directly and personally managing the patient. This critical care time included obtaining a history; examining the patient; pulse oximetry; ordering and review of studies; arranging urgent treatment with development of a management plan; evaluation  of patient's response to treatment; frequent reassessment; and, discussions with other providers.  This critical care time was performed to assess and manage the high probability of imminent, life-threatening deterioration that could result in multi-organ failure. It was exclusive of separately billable procedures and treating other patients and teaching time. Critical Care Time: 60 minutes.  Paula Southerly, MD [facility]

## 2024-08-25 NOTE — ED Notes (Signed)
 Pt seen by interventional radiolgy dr.  Decision to go to procedure.  Labs sent.

## 2024-08-25 NOTE — Procedures (Signed)
 Interventional Radiology Procedure:   Indications: Bilateral pulmonary emboli with right heart strain.    Procedure: 1) Bilateral pulmonary arteriography 2) Pulmonary artery thrombectomy 3) Placement of central line  Findings: Left jugular dual lumen central line placed, tip at SVC/RA junction.  Right common femoral venous access.  Patient developed SVT (HR in 160s) when wire was advanced into right atrium.  Blood pressure was dropped and a Code was called.  Critical Care performed emergent cardioversion.  Bilateral pulmonary artery thrombectomy was performed and large amount of clot removed.    Pre-thrombectomy main PA pressure: 33/20, mean 26 Post thrombectomy main PA pressure: 28/19, mean 23   Complications: None     EBL: Minimal  Plan:  Purse string suture in right groin.  Bedrest 4 hours.    Nissa Stannard R. Philip, MD  Pager: [phone]

## 2024-08-25 NOTE — Significant Event (Signed)
 Code called to IR department. Patient found to be in SVT with hemodynamic instability. DC cardioversion was done. The patient is back in sinus rhythm with improvement in BP.   Additional CC time: 40 minutes

## 2024-08-25 NOTE — ED Notes (Signed)
 Pt to interventional radiology, family in central waiting room.  All belongings with pt husband.  Heparin  running per order.

## 2024-08-25 NOTE — Progress Notes (Signed)
 PHARMACY - ANTICOAGULATION CONSULT NOTE  Pharmacy Consult for heparin   Indication: pulmonary embolus  Allergies  Allergen Reactions   Penicillins Rash    childhood reaction    Patient Measurements:    Vital Signs: Temp: 98.2 F (36.8 C) (11/22 1549) Temp Source: Oral (11/22 1549) BP: 127/85 (11/22 1830) Pulse Rate: 99 (11/22 1825)  Labs: Recent Labs    08/25/24 1210 08/25/24 1515 08/25/24 1534 08/25/24 2021  HGB 13.1 11.7* 11.9*  --   HCT 39.6 35.3* 35.0*  --   PLT 536* 507*  --   --   LABPROT  --  16.2*  --   --   INR  --  1.2  --   --   HEPARINUNFRC  --   --   --  >1.10*  CREATININE 1.00  --  0.70  --     Estimated Creatinine Clearance: 56.4 mL/min (by C-G formula based on SCr of 0.7 mg/dL).   Medical History: Past Medical History:  Diagnosis Date   Asthma    Breast cancer (HCC)    right   Complication of anesthesia    Dyspnea    Dysrhythmia    2012   Headache    History of kidney stones    passed   Hypothyroidism    Pneumonia 01/26/2023   PONV (postoperative nausea and vomiting)    rt breast ca 08/2022   Right Breast   Scoliosis    Thyroid  disease     Medications:  No anticoagulants PTA  Assessment: 68 yo F with metastatic breast cancer. Pharmacy consulted to dose heparin  for PE.  CTA: B PE Hg 13.1, PLT 536, SCr 1  Significant events: 11/22 During IR intervention, patient developed SVT requiring emergent cardioversion. Bilateral pulmonary artery thrombectomy was performed and large amount of clot removed. Central line placed.  Today, 08/25/24 20:21 Heparin  level >1.10 Per nurse, heparin  infusing through peripheral line and level obtained through central line.  No bleeding reported.  Goal of Therapy:  Heparin  level 0.3-0.7 units/ml Monitor platelets by anticoagulation protocol: Yes   Plan:  Hold heparin  infusion for 1 hour then resume IV heparin  at 800 units/hr Check heparin  level 6 hrs after restarting infusion Monitor daily  heparin  level, CBC, signs/symptoms of bleeding   Thank you for allowing pharmacy to be a part of this patient's care.  Eleanor EMERSON Agent, PharmD, BCPS Clinical Pharmacist [facility] 08/25/2024 9:16 PM

## 2024-08-25 NOTE — ED Notes (Signed)
 NS bolus hung, pt to ct angio.

## 2024-08-25 NOTE — Progress Notes (Signed)
 PHARMACY - ANTICOAGULATION CONSULT NOTE  Pharmacy Consult for heparin   Indication: pulmonary embolus  Allergies  Allergen Reactions   Penicillins Rash    childhood reaction    Patient Measurements:    Vital Signs: Temp: 98.1 F (36.7 C) (11/22 1050) Temp Source: Oral (11/22 1050) BP: 102/80 (11/22 1115) Pulse Rate: 106 (11/22 1115)  Labs: Recent Labs    08/25/24 1210  HGB 13.1  HCT 39.6  PLT 536*  CREATININE 1.00    Estimated Creatinine Clearance: 45.2 mL/min (by C-G formula based on SCr of 1 mg/dL).   Medical History: Past Medical History:  Diagnosis Date   Asthma    Breast cancer (HCC)    right   Complication of anesthesia    Dyspnea    Dysrhythmia    2012   Headache    History of kidney stones    passed   Hypothyroidism    Pneumonia 01/26/2023   PONV (postoperative nausea and vomiting)    rt breast ca 08/2022   Right Breast   Scoliosis    Thyroid  disease     Medications:  No anticoagulants PTA  Assessment: 68 yo F with metastatic breast cancer. Pharmacy consulted to dose heparin  for PE.  CTA: B PE  Hg 13.1, PLT 536, SCr 1 No anticoagulants PTA   Goal of Therapy:  Heparin  level 0.3-0.7 units/ml Monitor platelets by anticoagulation protocol: Yes   Plan:  Heparin  2000 unit bolus Heparin  drip @ 950 units/hr Check heparin  level 6 hrs after bolus Daily CBC & heparin  level  Rosaline IVAR Edison, Pharm.D Use secure chat for questions 08/25/2024 1:24 PM

## 2024-08-25 NOTE — ED Triage Notes (Signed)
 Patient has Breast Cancer with Mets. Increased shortness of breath. O2 Sats 84% RA. Also cancer treatment done her at [facility]. Patients husband is an MD and gives a good history.

## 2024-08-25 NOTE — ED Notes (Signed)
PIV inserted. Labs sent.

## 2024-08-26 ENCOUNTER — Inpatient Hospital Stay (HOSPITAL_COMMUNITY)

## 2024-08-26 ENCOUNTER — Other Ambulatory Visit: Payer: Self-pay

## 2024-08-26 ENCOUNTER — Telehealth: Payer: Self-pay | Admitting: Pulmonary Disease

## 2024-08-26 ENCOUNTER — Encounter: Payer: Self-pay | Admitting: Hematology and Oncology

## 2024-08-26 DIAGNOSIS — I2609 Other pulmonary embolism with acute cor pulmonale: Secondary | ICD-10-CM

## 2024-08-26 DIAGNOSIS — J910 Malignant pleural effusion: Secondary | ICD-10-CM

## 2024-08-26 DIAGNOSIS — J939 Pneumothorax, unspecified: Secondary | ICD-10-CM | POA: Diagnosis not present

## 2024-08-26 DIAGNOSIS — I2699 Other pulmonary embolism without acute cor pulmonale: Principal | ICD-10-CM

## 2024-08-26 DIAGNOSIS — Z452 Encounter for adjustment and management of vascular access device: Secondary | ICD-10-CM | POA: Diagnosis not present

## 2024-08-26 DIAGNOSIS — J90 Pleural effusion, not elsewhere classified: Secondary | ICD-10-CM | POA: Diagnosis not present

## 2024-08-26 DIAGNOSIS — J811 Chronic pulmonary edema: Secondary | ICD-10-CM | POA: Diagnosis not present

## 2024-08-26 DIAGNOSIS — C50411 Malignant neoplasm of upper-outer quadrant of right female breast: Secondary | ICD-10-CM

## 2024-08-26 LAB — BASIC METABOLIC PANEL WITH GFR
Anion gap: 10 (ref 5–15)
BUN: 26 mg/dL — ABNORMAL HIGH (ref 8–23)
CO2: 20 mmol/L — ABNORMAL LOW (ref 22–32)
Calcium: 8 mg/dL — ABNORMAL LOW (ref 8.9–10.3)
Chloride: 109 mmol/L (ref 98–111)
Creatinine, Ser: 0.81 mg/dL (ref 0.44–1.00)
GFR, Estimated: >60 mL/min (ref >60)
Glucose, Bld: 115 mg/dL — ABNORMAL HIGH (ref 70–99)
Potassium: 4 mmol/L (ref 3.5–5.1)
Sodium: 138 mmol/L (ref 135–145)

## 2024-08-26 LAB — CBC
HCT: 30.9 % — ABNORMAL LOW (ref 36.0–46.0)
Hemoglobin: 10.1 g/dL — ABNORMAL LOW (ref 12.0–15.0)
MCH: 32.3 pg (ref 26.0–34.0)
MCHC: 32.7 g/dL (ref 30.0–36.0)
MCV: 98.7 fL (ref 80.0–100.0)
Platelets: 507 K/uL — ABNORMAL HIGH (ref 150–400)
RBC: 3.13 MIL/uL — ABNORMAL LOW (ref 3.87–5.11)
RDW: 15.4 % (ref 11.5–15.5)
WBC: 4.8 K/uL (ref 4.0–10.5)
nRBC: 0 % (ref 0.0–0.2)

## 2024-08-26 LAB — ECHOCARDIOGRAM LIMITED
Height: 63 in
S' Lateral: 1.7 cm
Weight: 2148.16 [oz_av]

## 2024-08-26 LAB — HEPARIN LEVEL (UNFRACTIONATED)
Heparin Unfractionated: 0.22 [IU]/mL — ABNORMAL LOW (ref 0.30–0.70)
Heparin Unfractionated: 0.18 [IU]/mL — ABNORMAL LOW (ref 0.30–0.70)
Heparin Unfractionated: 0.35 [IU]/mL (ref 0.30–0.70)

## 2024-08-26 MED ORDER — HEPARIN BOLUS VIA INFUSION
2000.0000 [IU] | Freq: Once | INTRAVENOUS | Status: AC
  Administered 2024-08-26: 2000 [IU] via INTRAVENOUS
  Filled 2024-08-26: qty 2000

## 2024-08-26 MED ORDER — ENSURE PLUS HIGH PROTEIN PO LIQD: 237.0000 mL | Freq: Two times a day (BID) | ORAL | Status: DC

## 2024-08-26 MED ORDER — NOREPINEPHRINE 4 MG/250ML-% IV SOLN
0.0000 ug/min | INTRAVENOUS | Status: DC
  Administered 2024-08-26: 4 ug/min via INTRAVENOUS
  Filled 2024-08-26: qty 250

## 2024-08-26 NOTE — Progress Notes (Addendum)
 NAME:  Caitlin [name], MRN:  [id number], DOB:  Jul 12, 1956, LOS: 1 ADMISSION DATE:  08/25/2024, CONSULTATION DATE:  @TODAY @ REFERRING MD:  ED, CHIEF COMPLAINT:  pulmonary embolism   History of Present Illness:  Ms. 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 CCM consulted for evaluation and management.  The patient notes that she has been struggling with shortness of breath for a while which she notes was related to recurrent pleural effusion. She had 1.1 L drained from her left pleural space on 10/29 and she felt improved after that. The shortness of breath recurred and so she went again for thoracetnesis on 11/19 where another 1.1 L of pleural fluid was drained from left pleural space. She did not feel much improved this time and so another thoracentesis was done on the right side draining 400 mL. She did not feel much better and she noted worsening shortness of breath. Her husband is a family doctor and checked her SPO2 and it was around 86%. Therefore, she was brought in to the ED for evaluation. She was also noted to be tachycardic upon his evaluation. Of note, patient had a long road trip recently which may have been another provoking factor aside form hx of metastatic breast cancer.  Upon arrival to ED, HR in 100s, BP stable off pressors, SPO2 > 90% on 2 L O2 by [place] CTA Chest was ordered showing a small R apical pneumothorax, bilateral central pulmonary emboli, RV dilated suggestive of RV strain  Pertinent  Medical History   Past Medical History:  Diagnosis Date   Asthma    Breast cancer (HCC)    right   Complication of anesthesia    Dyspnea    Dysrhythmia    2012   Headache    History of kidney stones    passed   Hypothyroidism    Pneumonia 01/26/2023   PONV (postoperative nausea and vomiting)    rt breast ca 08/2022   Right Breast    Scoliosis    Thyroid  disease    Significant Hospital Events: Including procedures, antibiotic start and stop dates in addition to other pertinent events   11/22 --> ED for hypoxia and SOB --> submassive PE --> IR for thrombectomy --> unstable SVT, DCCV --> thrombectomy completed  Interim History / Subjective:  Feels dyspnea is improved. Some discomfort at site of DCCV. Afebrile, HR 70 -90s, hemodynamics supported on low dose NE, SPO2 > 90% on 3 L O2 by [place] I/O + 320 mL Drips: NE, heparin   Objective    Blood pressure 108/66, pulse 83, temperature 98.2 F (36.8 C), temperature source Oral, resp. rate 16, height 5' 3 (1.6 m), weight 60.9 kg, SpO2 99%.    FiO2 (%):  [15 %-32 %] 32 %   Intake/Output Summary (Last 24 hours) at 08/26/2024 1044 Last data filed at 08/26/2024 1033 Gross per 24 hour  Intake 1227.99 ml  Output 500 ml  Net 727.99 ml   Filed Weights   08/25/24 1900 08/26/24 0500  Weight: 59.7 kg 60.9 kg    Examination: General: thin, frail woman, no distress Eyes: PERRL, no scleral icterus ENMT: oropharynx clear, good dentition Skin: warm, intact, no rashes Neck: JVD flat, ROM and lymph node assessment normal CV: RRR, no MRG, nl S1 and S2, no peripheral edema Resp: decreased breath sounds in bases bilaterally Abdom: Normoactive bowel sounds,  soft, nontender, nondistended, no hepatosplenomegaly Neuro: Awake alert oriented to person place time and situation  Resolved problem list   Assessment and Plan   #Acute Hypoxic Respiratory Failure: 2/2 to below #Massive PE: evidence of RV strain on CT chest and my POCUS. PESI score 167, BOVA score 7. Underwent thrombectomy on 11/22 #Obstructive Shock:  - Heparin  drip - Will likely need to be on DOAC after IPC is placed - NE drip, wean as tolerated - Given hx of metastatic cancer, will be on long term anticoagulation. - Wean O2 as tolerated - Limited echo to assess RV function  #Recurrent Malignant Left Pleural  Effusion: - POCUS evaluation shows moderate anechoic pleural effusion on the left side. I discussed pursuing indwelling pleural catheter with family to decrease risk of intermittent holding of anticoagulation for recurrent thoracentesis. We discussed risks/benefits and alternatives in detail. They wish to proceed with PLEURX placement. - NPO at [facility] - Case request for PleurX placed - Will need to coordinate with endoscopy suite regarding timing of procedure - Hold heparin  drip 4 hours prior to procedure - Will need to order PleurX supply from company prior to discharge  #Stress Ulcer PPx: PPI  #Nutrition: regular diet  #VTE ppx: on heparin  as above  Disposition: ICU appropriate for hemodynamic monitoring and vasopressors  Labs   CBC: Recent Labs  Lab 08/20/24 0825 08/25/24 1210 08/25/24 1515 08/25/24 1534 08/26/24 0435  WBC 4.2 5.6 5.4  --  4.8  NEUTROABS 3.0  --   --   --   --   HGB 11.2* 13.1 11.7* 11.9* 10.1*  HCT 33.6* 39.6 35.3* 35.0* 30.9*  MCV 96.0 97.3 97.2  --  98.7  PLT 440* 536* 507*  --  507*    Basic Metabolic Panel: Recent Labs  Lab 08/20/24 0825 08/25/24 1210 08/25/24 1515 08/25/24 1534 08/25/24 2021 08/26/24 0435  NA 137 137  --  136 138 138  K 3.9 4.7  --  5.8* 3.8 4.0  CL 103 104  --  111 108 109  CO2 24 19*  --   --  19* 20*  GLUCOSE 82 109*  --  95 99 115*  BUN 22 30*  --  38* 25* 26*  CREATININE 0.93 1.00  --  0.70 0.68 0.81  CALCIUM 8.6* 9.2  --   --  7.9* 8.0*  MG  --   --  2.4  --   --   --   PHOS  --   --  3.1  --   --   --    GFR: Estimated Creatinine Clearance: 55.8 mL/min (by C-G formula based on SCr of 0.81 mg/dL). Recent Labs  Lab 08/20/24 0825 08/25/24 1210 08/25/24 1515 08/25/24 1535 08/26/24 0435  WBC 4.2 5.6 5.4  --  4.8  LATICACIDVEN  --   --   --  1.6  --     Liver Function Tests: Recent Labs  Lab 08/20/24 0825 08/25/24 2021  AST 22 23  ALT 13 10  ALKPHOS 65 71  BILITOT 0.3 0.4  PROT 6.6 5.7*  ALBUMIN 3.6  3.1*   No results for input(s): LIPASE, AMYLASE in the last 168 hours. No results for input(s): AMMONIA in the last 168 hours.  ABG    Component Value Date/Time   TCO2 19 (L) 08/25/2024 1534     Coagulation Profile: Recent Labs  Lab 08/25/24 1515  INR 1.2    Cardiac Enzymes: No results for input(s): CKTOTAL, CKMB, CKMBINDEX, TROPONINI  in the last 168 hours.  HbA1C: No results found for: HGBA1C  CBG: No results for input(s): GLUCAP in the last 168 hours.  Review of Systems:   Not obtained.  Past Medical History:  She,  has a past medical history of Asthma, Breast cancer (HCC), Complication of anesthesia, Dyspnea, Dysrhythmia, Headache, History of kidney stones, Hypothyroidism, Pneumonia (01/26/2023), PONV (postoperative nausea and vomiting), rt breast ca (08/2022), Scoliosis, and Thyroid  disease.   Surgical History:   Past Surgical History:  Procedure Laterality Date   APPENDECTOMY     AXILLARY SENTINEL NODE BIOPSY Right 10/14/2022   Procedure: RIGHT AXILLARY SENTINEL NODE BIOPSY;  Surgeon: Ebbie Cough, MD;  Location: [facility] OR;  Service: General;  Laterality: Right;   BREAST BIOPSY Right 09/15/2022   US  RT BREAST BX W LOC DEV 1ST LESION IMG BX SPEC US  GUIDE 09/15/2022 [facility]   BREAST BIOPSY  10/12/2022   US  RT RADIOACTIVE SEED LOC 10/12/2022 [facility]   BREAST LUMPECTOMY WITH RADIOACTIVE SEED AND SENTINEL LYMPH NODE BIOPSY Right 10/14/2022   Procedure: RIGHT BREAST LUMPECTOMY WITH RADIOACTIVE SEED;  Surgeon: Ebbie Cough, MD;  Location: [facility] OR;  Service: General;  Laterality: Right;   COLONOSCOPY     IR THROMBECT VENO MECH MOD SED  08/25/2024   IR TUNNELED CENTRAL VENOUS CATH PLC W IMG  08/25/2024   IR US  GUIDE VASC ACCESS LEFT  08/25/2024   IR US  GUIDE VASC ACCESS RIGHT  08/25/2024   LAPAROSCOPIC APPENDECTOMY     MASTECTOMY Right    PORT-A-CATH REMOVAL Left 05/03/2023   Procedure: REMOVAL PORT-A-CATH;  Surgeon:  Ebbie Cough, MD;  Location: [facility] OR;  Service: General;  Laterality: Left;   PORT-A-CATH REMOVAL Left 12/22/2023   Procedure: REMOVAL PORT-A-CATH;  Surgeon: Ebbie Cough, MD;  Location: [facility];  Service: General;  Laterality: Left;   PORTACATH PLACEMENT Left 10/14/2022   Procedure: INSERTION PORT-A-CATH;  Surgeon: Ebbie Cough, MD;  Location: [facility] OR;  Service: General;  Laterality: Left;   PORTACATH PLACEMENT Left 05/19/2023   Procedure: PORT PLACEMENT WITH ULTRASOUND GUIDANCE;  Surgeon: Ebbie Cough, MD;  Location: [facility];  Service: General;  Laterality: Left;   RE-EXCISION OF BREAST LUMPECTOMY Right 11/08/2022   Procedure: RE-EXCISION OF RIGHT BREAST LUMPECTOMY;  Surgeon: Ebbie Cough, MD;  Location: [facility];  Service: General;  Laterality: Right;   RE-EXCISION OF BREAST LUMPECTOMY Right 11/18/2022   Procedure: RE-EXCISION OF RIGHT BREAST LUMPECTOMY;  Surgeon: Ebbie Cough, MD;  Location: [facility] OR;  Service: General;  Laterality: Right;   SIMPLE MASTECTOMY WITH AXILLARY SENTINEL NODE BIOPSY Right 05/03/2023   Procedure: RIGHT MASTECTOMY;  Surgeon: Ebbie Cough, MD;  Location: [facility] OR;  Service: General;  Laterality: Right;  GEN & PEC BLOCK   WISDOM TOOTH EXTRACTION       Social History:   reports that she has never smoked. She does not have any smokeless tobacco history on file. She reports that she does not currently use alcohol. She reports that she does not use drugs.   Family History:  Her family history includes Asthma in her sister; Clotting disorder in her paternal grandmother; Heart attack in her maternal grandfather and maternal grandmother; Hypertension in her father; Prostate cancer (age of onset: 9) in her brother; Prostate cancer (age of onset: 46) in her father; Thyroid  disease in her mother and sister.   Allergies Allergies  Allergen Reactions   Penicillins Rash    childhood  reaction  Did it involve swelling  of the face/tongue/throat, SOB, or low BP? No Did it involve sudden or severe rash/hives, skin peeling, or any reaction on the inside of your mouth or nose? Yes Did you need to seek medical attention at a hospital or doctor's office? No When did it last happen?      childhood If all above answers are NO, may proceed with cephalosporin use.       Home Medications  Prior to Admission medications   Medication Sig Start Date End Date Taking? Authorizing Provider  albuterol  (VENTOLIN  HFA) 108 (90 Base) MCG/ACT inhaler Inhale 2 puffs into the lungs every 4 (four) hours as needed for shortness of breath (Asthma). 05/30/22   [provider]  alendronate  (FOSAMAX ) 70 MG tablet Take 1 tablet (70 mg total) by mouth once a week. Take with a full glass of water  on an empty stomach. 12/21/23   Gudena, Vinay, MD  aspirin 81 MG tablet Take 81 mg by mouth daily.      [provider]  dexamethasone  (DECADRON ) 4 MG tablet Take 1 tablets  by mouth daily for 3 days starting the day after chemotherapy. Take with food. 07/25/24   Gudena, Vinay, MD  diltiazem  (CARDIZEM  CD) 120 MG 24 hr capsule Take 120 mg by mouth daily.    [provider]  levothyroxine  (SYNTHROID ) 75 MCG tablet Take 75 mcg by mouth daily before breakfast.    [provider]  lidocaine -prilocaine  (EMLA ) cream Apply to affected area once 07/25/24   Gudena, Vinay, MD  Multiple Vitamins-Minerals (MULTIVITAMIN WITH MINERALS) tablet Take 1 tablet by mouth daily.    [provider]  ondansetron  (ZOFRAN ) 8 MG tablet Take 1 tablet (8 mg total) by mouth every 8 (eight) hours as needed for nausea or vomiting. Start on the third day after chemotherapy. 07/25/24   Odean Potts, MD  OVER THE COUNTER MEDICATION Take 1 capsule by mouth 2 (two) times daily. copaiba softgels    [provider]  oxyCODONE  (OXY IR/ROXICODONE ) 5 MG immediate release tablet Take 1 tablet (5 mg  total) by mouth every 6 (six) hours as needed for severe pain (pain score 7-10). 07/30/24   Gudena, Vinay, MD  prochlorperazine  (COMPAZINE ) 10 MG tablet Take 1 tablet (10 mg total) by mouth every 6 (six) hours as needed for nausea or vomiting. 08/14/24   Odean Potts, MD  PULMICORT  FLEXHALER 180 MCG/ACT inhaler Inhale 1 puff into the lungs daily.    [provider]     Due to a high probability of clinically significant, life threatening deterioration, the patient required my highest level of preparedness to intervene emergently and I personally spent this critical care time directly and personally managing the patient. This critical care time included obtaining a history; examining the patient; pulse oximetry; ordering and review of studies; arranging urgent treatment with development of a management plan; evaluation of patient's response to treatment; frequent reassessment; and, discussions with other providers.  This critical care time was performed to assess and manage the high probability of imminent, life-threatening deterioration that could result in multi-organ failure. It was exclusive of separately billable procedures and treating other patients and teaching time. Critical Care Time: 40 minutes.  Paula Southerly, MD [facility]

## 2024-08-26 NOTE — Progress Notes (Signed)
 PHARMACY - ANTICOAGULATION CONSULT NOTE  Pharmacy Consult for heparin  Indication: pulmonary embolus  Allergies  Allergen Reactions   Penicillins Rash    childhood reaction  Did it involve swelling of the face/tongue/throat, SOB, or low BP? No Did it involve sudden or severe rash/hives, skin peeling, or any reaction on the inside of your mouth or nose? Yes Did you need to seek medical attention at a hospital or doctor's office? No When did it last happen?      childhood If all above answers are NO, may proceed with cephalosporin use.      Patient Measurements: Height: 5' 3 (160 cm) Weight: 60.9 kg (134 lb 4.2 oz) IBW/kg (Calculated) : 52.4 HEPARIN  DW (KG): 59.7  Vital Signs: Temp: 97.6 F (36.4 C) (11/23 1715) Temp Source: Oral (11/23 1715) BP: 122/78 (11/23 1930) Pulse Rate: 84 (11/23 1930)  Labs: Recent Labs    08/25/24 1210 08/25/24 1515 08/25/24 1534 08/25/24 2021 08/25/24 2021 08/26/24 0435 08/26/24 1200 08/26/24 1930  HGB 13.1 11.7* 11.9*  --   --  10.1*  --   --   HCT 39.6 35.3* 35.0*  --   --  30.9*  --   --   PLT 536* 507*  --   --   --  507*  --   --   LABPROT  --  16.2*  --   --   --   --   --   --   INR  --  1.2  --   --   --   --   --   --   HEPARINUNFRC  --   --   --  >1.10*   < > 0.22* 0.18* 0.35  CREATININE 1.00  --  0.70 0.68  --  0.81  --   --    < > = values in this interval not displayed.    Estimated Creatinine Clearance: 55.8 mL/min (by C-G formula based on SCr of 0.81 mg/dL).   Medical History: Past Medical History:  Diagnosis Date   Asthma    Breast cancer (HCC)    right   Complication of anesthesia    Dyspnea    Dysrhythmia    2012   Headache    History of kidney stones    passed   Hypothyroidism    Pneumonia 01/26/2023   PONV (postoperative nausea and vomiting)    rt breast ca 08/2022   Right Breast   Scoliosis    Thyroid  disease     Medications: No anticoagulants PTA  Assessment: Pt is a 68yoF with PMH  significant for metastatic breast cancer, recurrent pleural effusion who presented with SOB. CTA revealed multiple bilateral segmental PE with right heart strain. Pt underwent bilateral thrombectomy by IR on 08/25/24.   Pharmacy consulted to dose heparin  for PE.   Today, 08/26/24 19:30 Heparin  level = 0.35, therapeutic on heparin  infusion of 1100 units/hr CBC: Hgb low, Plt elevated No bleeding or complications per nurse  Goal of Therapy:  Heparin  level 0.3-0.7 units/ml Monitor platelets by anticoagulation protocol: Yes   Plan:  Continue IV heparin  at 1100 units/hr Check confirmatory 6 hour heparin  level Monitor daily heparin  level, CBC, signs/symptoms of bleeding   Thank you for allowing pharmacy to be a part of this patient's care.  Eleanor EMERSON Agent, PharmD, BCPS Clinical Pharmacist [facility] 08/26/2024 8:32 PM

## 2024-08-26 NOTE — Plan of Care (Incomplete)

## 2024-08-26 NOTE — Telephone Encounter (Signed)
 Needs follow up with Dr. Catherine for pleural effusion.

## 2024-08-26 NOTE — Progress Notes (Signed)
 PHARMACY - ANTICOAGULATION CONSULT NOTE  Pharmacy Consult for heparin  Indication: pulmonary embolus  Allergies  Allergen Reactions   Penicillins Rash    childhood reaction  Did it involve swelling of the face/tongue/throat, SOB, or low BP? No Did it involve sudden or severe rash/hives, skin peeling, or any reaction on the inside of your mouth or nose? Yes Did you need to seek medical attention at a hospital or doctor's office? No When did it last happen?      childhood If all above answers are NO, may proceed with cephalosporin use.      Patient Measurements: Height: 5' 3 (160 cm) Weight: 60.9 kg (134 lb 4.2 oz) IBW/kg (Calculated) : 52.4 HEPARIN  DW (KG): 59.7  Vital Signs: Temp: 98.2 F (36.8 C) (11/23 0834) Temp Source: Oral (11/23 0834) BP: 108/66 (11/23 0630) Pulse Rate: 83 (11/23 0630)  Labs: Recent Labs    08/25/24 1210 08/25/24 1515 08/25/24 1534 08/25/24 2021 08/26/24 0435  HGB 13.1 11.7* 11.9*  --  10.1*  HCT 39.6 35.3* 35.0*  --  30.9*  PLT 536* 507*  --   --  507*  LABPROT  --  16.2*  --   --   --   INR  --  1.2  --   --   --   HEPARINUNFRC  --   --   --  >1.10* 0.22*  CREATININE 1.00  --  0.70 0.68 0.81    Estimated Creatinine Clearance: 55.8 mL/min (by C-G formula based on SCr of 0.81 mg/dL).   Medical History: Past Medical History:  Diagnosis Date   Asthma    Breast cancer (HCC)    right   Complication of anesthesia    Dyspnea    Dysrhythmia    2012   Headache    History of kidney stones    passed   Hypothyroidism    Pneumonia 01/26/2023   PONV (postoperative nausea and vomiting)    rt breast ca 08/2022   Right Breast   Scoliosis    Thyroid  disease     Medications: No anticoagulants PTA  Assessment: Pt is a 68yoF with PMH significant for metastatic breast cancer, recurrent pleural effusion who presented with SOB. CTA revealed multiple bilateral segmental PE with right heart strain. Pt underwent bilateral thrombectomy by IR  on 08/25/24.   Pharmacy consulted to dose heparin  for PE.   Today, 08/26/24 Heparin  level = 0.18 is subtherapeutic on heparin  infusion of 900 units/hr CBC: Hgb low, Plt elevated No bleeding or complications reported. Confirmed no pauses/interruptions  Goal of Therapy:  Heparin  level 0.3-0.7 units/ml Monitor platelets by anticoagulation protocol: Yes   Plan:  Heparin  bolus of 2000 units IV once Increase rate of heparin  infusion to 1100 units/hr Check 6 hour heparin  level CBC, heparin  level daily Monitor for signs of bleeding  Ronal HERO Dionis Autry 08/26/2024,10:03 AM

## 2024-08-26 NOTE — Progress Notes (Signed)
 PHARMACY - ANTICOAGULATION CONSULT NOTE  Pharmacy Consult for heparin   Indication: pulmonary embolus  Allergies  Allergen Reactions   Penicillins Rash    childhood reaction  Did it involve swelling of the face/tongue/throat, SOB, or low BP? No Did it involve sudden or severe rash/hives, skin peeling, or any reaction on the inside of your mouth or nose? Yes Did you need to seek medical attention at a hospital or doctor's office? No When did it last happen?      childhood If all above answers are NO, may proceed with cephalosporin use.      Patient Measurements: Height: 5' 3 (160 cm) Weight: 60.9 kg (134 lb 4.2 oz) IBW/kg (Calculated) : 52.4 HEPARIN  DW (KG): 59.7  Vital Signs: Temp: 97.9 F (36.6 C) (11/23 0415) Temp Source: Oral (11/23 0415) BP: 101/68 (11/23 0500) Pulse Rate: 81 (11/23 0500)  Labs: Recent Labs    08/25/24 1210 08/25/24 1515 08/25/24 1534 08/25/24 2021 08/26/24 0435  HGB 13.1 11.7* 11.9*  --  10.1*  HCT 39.6 35.3* 35.0*  --  30.9*  PLT 536* 507*  --   --  507*  LABPROT  --  16.2*  --   --   --   INR  --  1.2  --   --   --   HEPARINUNFRC  --   --   --  >1.10* 0.22*  CREATININE 1.00  --  0.70 0.68 0.81    Estimated Creatinine Clearance: 55.8 mL/min (by C-G formula based on SCr of 0.81 mg/dL).   Medical History: Past Medical History:  Diagnosis Date   Asthma    Breast cancer (HCC)    right   Complication of anesthesia    Dyspnea    Dysrhythmia    2012   Headache    History of kidney stones    passed   Hypothyroidism    Pneumonia 01/26/2023   PONV (postoperative nausea and vomiting)    rt breast ca 08/2022   Right Breast   Scoliosis    Thyroid  disease     Medications:  No anticoagulants PTA  Assessment: 68 yo F with metastatic breast cancer. Pharmacy consulted to dose heparin  for PE.  CTA: B PE Hg 13.1, PLT 536, SCr 1  Significant events: 11/22 During IR intervention, patient developed SVT requiring emergent  cardioversion. Bilateral pulmonary artery thrombectomy was performed and large amount of clot removed. Central line placed.  Today, 08/26/24 Heparin  level = 0.22, now subtherapeutic after heparin  infusion held x 1 hr and rate reduced to 800 units/hr from 950 units/hr Hgb = 10.1, Pltc 507K RN reports no bleeding complications.  IV heparin  infusing through peripheral line and level obtained through central line.   Goal of Therapy:  Heparin  level 0.3-0.7 units/ml Monitor platelets by anticoagulation protocol: Yes   Plan:  Increase heparin  gtt to 900 units/hr Check heparin  level 6 hrs after rate increase Monitor daily heparin  level, CBC, signs/symptoms of bleeding   Thank you for allowing pharmacy to be a part of this patient's care.  Nickie Deren, PharmD 08/26/2024 5:36 AM

## 2024-08-26 NOTE — Plan of Care (Signed)
  Problem: Education: Goal: Knowledge of General Education information will improve Description: Including pain rating scale, medication(s)/side effects and non-pharmacologic comfort measures Outcome: Progressing   Problem: Health Behavior/Discharge Planning: Goal: Ability to manage health-related needs will improve Outcome: Progressing   Problem: Clinical Measurements: Goal: Ability to maintain clinical measurements within normal limits will improve Outcome: Progressing Goal: Will remain free from infection Outcome: Progressing Goal: Diagnostic test results will improve Outcome: Progressing Goal: Respiratory complications will improve Outcome: Progressing Goal: Cardiovascular complication will be avoided Outcome: Progressing   Problem: Activity: Goal: Risk for activity intolerance will decrease Outcome: Progressing   Problem: Nutrition: Goal: Adequate nutrition will be maintained Outcome: Progressing   Problem: Coping: Goal: Level of anxiety will decrease Outcome: Progressing   Problem: Elimination: Goal: Will not experience complications related to bowel motility Outcome: Progressing Goal: Will not experience complications related to urinary retention Outcome: Progressing   Problem: Pain Managment: Goal: General experience of comfort will improve and/or be controlled Outcome: Progressing   Problem: Safety: Goal: Ability to remain free from injury will improve Outcome: Progressing   Problem: Skin Integrity: Goal: Risk for impaired skin integrity will decrease Outcome: Progressing   Problem: Education: Goal: Understanding of CV disease, CV risk reduction, and recovery process will improve Outcome: Progressing   Problem: Activity: Goal: Ability to return to baseline activity level will improve Outcome: Progressing   Problem: Cardiovascular: Goal: Ability to achieve and maintain adequate cardiovascular perfusion will improve Outcome: Progressing Goal:  Vascular access site(s) Level 0-1 will be maintained Outcome: Progressing   Problem: Health Behavior/Discharge Planning: Goal: Ability to safely manage health-related needs after discharge will improve Outcome: Progressing   Cindy S. Loreli BSN, RN, [facility], CCRN 08/26/2024 6:28 AM

## 2024-08-26 NOTE — Progress Notes (Signed)
  Echocardiogram 2D Echocardiogram has been performed.  Tinnie FORBES Gosling RDCS 08/26/2024, 9:00 AM

## 2024-08-26 NOTE — Progress Notes (Signed)
 Patient ID: Caitlin [name], female   DOB: 1956-01-21, 68 y.o.   MRN: [id number]    Referring Physician(s): Arloa Chroman, PA-C   Supervising Physician: Philip Cornet  Patient Status:  [facility] - In-pt  Chief Complaint:  Bilateral pulmonary emboli; s/p PE thrombectomy 08/25/24  Subjective:  Pt feeling better today, dyspnea improved. BP stable off pressors and HR has been stable 70s-80s since required cardioversion for SVT. 100% on 3L [place]. No complaints regarding groin access site. Pt continues to be on heparin .  Allergies: Penicillins  Medications: Prior to Admission medications   Medication Sig Start Date End Date Taking? Authorizing Provider  albuterol  (VENTOLIN  HFA) 108 (90 Base) MCG/ACT inhaler Inhale 2 puffs into the lungs every 4 (four) hours as needed for shortness of breath (Asthma). 05/30/22  Yes [provider]  alendronate  (FOSAMAX ) 70 MG tablet Take 1 tablet (70 mg total) by mouth once a week. Take with a full glass of water  on an empty stomach. 12/21/23  Yes Gudena, Vinay, MD  aspirin 81 MG tablet Take 81 mg by mouth daily.     Yes [provider]  cetirizine (ZYRTEC) 10 MG tablet Take 10 mg by mouth daily.   Yes [provider]  dexamethasone  (DECADRON ) 4 MG tablet Take 1 tablets  by mouth daily for 3 days starting the day after chemotherapy. Take with food. 07/25/24  Yes Gudena, Vinay, MD  diltiazem  (CARDIZEM  CD) 120 MG 24 hr capsule Take 120 mg by mouth daily.   Yes [provider]  levothyroxine  (SYNTHROID ) 75 MCG tablet Take 75 mcg by mouth daily before breakfast.   Yes [provider]  Multiple Vitamins-Minerals (MULTIVITAMIN WITH MINERALS) tablet Take 1 tablet by mouth daily.   Yes [provider]  ondansetron  (ZOFRAN ) 8 MG tablet Take 1 tablet (8 mg total) by mouth every 8 (eight) hours as needed for nausea or vomiting. Start on the third day after chemotherapy. 07/25/24  Yes Odean Potts, MD  OVER THE COUNTER  MEDICATION Take 1 capsule by mouth 2 (two) times daily. copaiba softgels   Yes [provider]  oxyCODONE  (OXY IR/ROXICODONE ) 5 MG immediate release tablet Take 1 tablet (5 mg total) by mouth every 6 (six) hours as needed for severe pain (pain score 7-10). 07/30/24  Yes Gudena, Vinay, MD  prochlorperazine  (COMPAZINE ) 10 MG tablet Take 1 tablet (10 mg total) by mouth every 6 (six) hours as needed for nausea or vomiting. 08/14/24  Yes Gudena, Vinay, MD  PULMICORT  FLEXHALER 180 MCG/ACT inhaler Inhale 1 puff into the lungs daily.   Yes [provider]  lidocaine -prilocaine  (EMLA ) cream Apply to affected area once Patient not taking: Reported on 08/25/2024 07/25/24   Odean Potts, MD     Vital Signs: BP 96/62   Pulse 77   Temp 98.2 F (36.8 C) (Oral)   Resp 14   Ht 5' 3 (1.6 m)   Wt 134 lb 4.2 oz (60.9 kg)   SpO2 96%   BMI 23.78 kg/m   Physical Exam Vitals and nursing note reviewed.  Constitutional:      Appearance: Normal appearance.  Cardiovascular:     Rate and Rhythm: Normal rate and regular rhythm.  Pulmonary:     Effort: Pulmonary effort is normal.     Breath sounds: Normal breath sounds.  Abdominal:     Palpations: Abdomen is soft.     Tenderness: There is no abdominal tenderness.  Skin:    General: Skin is warm and dry.  Comments: + R groin flowstasis closure device. No overlying abnormality. No sign of active bleeding or hematoma. Dressed appropriately  Neurological:     Mental Status: She is alert and oriented to person, place, and time. Mental status is at baseline.     Imaging: ECHOCARDIOGRAM LIMITED Result Date: 08/26/2024    ECHOCARDIOGRAM LIMITED REPORT   Patient Name:   Caitlin [name] Date of Exam: 08/26/2024 Medical Rec #:  [id number]        Height:       63.0 in Accession #:    [id number]       Weight:       134.3 lb Date of Birth:  1956/01/27       BSA:          1.632 m Patient Age:    33 years         BP:           94/60 mmHg Patient  Gender: F                HR:           79 bpm. Exam Location:  Inpatient Procedure: 2D Echo, Color Doppler, Cardiac Doppler and Limited Echo (Both            Spectral and Color Flow Doppler were utilized during procedure). Indications:    Pulmonary Embolus I26.09  History:        Patient has prior history of Echocardiogram examinations, most                 recent 07/27/2024.  Sonographer:    Tinnie Gosling RDCS Referring Phys: [id number] PAULA SOUTHERLY IMPRESSIONS  1. Left ventricular ejection fraction, by estimation, is 60 to 65%. The left ventricle has normal function. There is mild left ventricular hypertrophy.  2. Right ventricular systolic function is mildly reduced. The right ventricular size is mildly enlarged. There is normal pulmonary artery systolic pressure. The estimated right ventricular systolic pressure is 28.8 mmHg.  3. The aortic valve was not well visualized.  4. The inferior vena cava is normal in size with <50% respiratory variability, suggesting right atrial pressure of 8 mmHg. FINDINGS  Left Ventricle: Left ventricular ejection fraction, by estimation, is 60 to 65%. The left ventricle has normal function. There is mild left ventricular hypertrophy. Right Ventricle: The right ventricular size is mildly enlarged. Right ventricular systolic function is mildly reduced. There is normal pulmonary artery systolic pressure. The tricuspid regurgitant velocity is 2.28 m/s, and with an assumed right atrial pressure of 8 mmHg, the estimated right ventricular systolic pressure is 28.8 mmHg. Left Atrium: Left atrial size was normal in size. Tricuspid Valve: The tricuspid valve is grossly normal. Tricuspid valve regurgitation is mild. Aortic Valve: The aortic valve was not well visualized. Pulmonic Valve: Pulmonic valve regurgitation is trivial. Venous: The inferior vena cava is normal in size with less than 50% respiratory variability, suggesting right atrial pressure of 8 mmHg. LEFT VENTRICLE PLAX 2D LVIDd:          3.60 cm LVIDs:         1.70 cm LV PW:         1.30 cm LV IVS:        1.30 cm  RIGHT VENTRICLE            IVC RV S prime:     9.03 cm/s  IVC diam: 1.80 cm TAPSE (M-mode): 1.7 cm RIGHT ATRIUM  Index RA Area:     12.30 cm RA Volume:   31.30 ml  19.17 ml/m  TRICUSPID VALVE TR Peak grad:   20.8 mmHg TR Vmax:        228.00 cm/s Soyla Merck MD Electronically signed by Soyla Merck MD Signature Date/Time: 08/26/2024/9:13:19 AM    Final    IR THROMBECT VENO [facility] MOD SED Result Date: 08/26/2024 INDICATION: 68 year old with metastatic breast cancer and shortness of breath. CT demonstrates bilateral central pulmonary embolism and right heart strain. EXAM: 1. Pulmonary arteriography with pressures 2. Pulmonary artery thrombectomy 3. Ultrasound guidance for vascular access x2 4. Placement of non tunneled central venous catheter. COMPARISON:  Chest CTA 08/25/2024 MEDICATIONS: Moderate sedation ANESTHESIA/SEDATION: Moderate (conscious) sedation was employed during this procedure. A total of Versed  4 mg and Fentanyl  100 mcg was administered intravenously by the radiology nurse. Total intra-service moderate Sedation Time: 125 minutes. The patient's level of consciousness and vital signs were monitored continuously by radiology nursing throughout the procedure under my direct supervision. FLUOROSCOPY: Radiation Exposure Index (as provided by the fluoroscopic device): 249 mGy Kerma CONTRAST:  100 mL Omnipaque  300 COMPLICATIONS: None immediate. TECHNIQUE: Informed written consent was obtained from the patient after a thorough discussion of the procedural risks, benefits and alternatives. All questions were addressed. A timeout was performed prior to the initiation of the procedure. Ultrasound demonstrated a patent left internal jugular vein. Ultrasound image was saved for documentation. Left neck was prepped and draped in sterile fashion. Maximal barrier sterile technique was utilized including caps, mask,  sterile gowns, sterile gloves, sterile drape, hand hygiene and skin antiseptic. Left neck was anesthetized with 1% lidocaine . Small incision was made. Using ultrasound guidance, 21 gauge needle was directed into the left internal jugular vein. Micropuncture catheter was placed. Bentson wire was advanced into the superior vena cava. Peel-away sheath was placed. Dual lumen power PICC line was cut to an appropriate length and advanced through the peel-away sheath. The tip was placed at the superior cavoatrial junction. Both lumens aspirated and flushed well. Both lumens were capped and clamped. Central line was sutured to skin and a bandage was placed. Right groin was prepped and draped in sterile fashion. Maximal barrier sterile technique was utilized including caps, mask, sterile gowns, sterile gloves, sterile drape, hand hygiene and skin antiseptic. Ultrasound confirmed a patent right common femoral vein. Ultrasound image was saved for documentation. Skin was anesthetized with 1% lidocaine . Small incision was made. Using ultrasound guidance, 21 gauge needle was directed into the right common femoral vein and micropuncture dilator set was placed. 8 French vascular sheath was placed over a Bentson wire. Curved pigtail catheter was advanced into the right heart. At this point, patient developed supraclavicular tachycardia. The supraventricular tachycardia was not subsiding and the patient's blood pressure was dropping. Critical care performed electrical cardioversion emergently to get the patient back into normal sinus rhythm. Pigtail catheter was advanced back into the right atrium. It was difficult to advance a pigtail catheter into the right ventricle. Pigtail catheter was exchanged for a C2 catheter. C2 catheter was successfully advanced into the right ventricle and main pulmonary artery using a Bentson wire. Pulmonary artery pressure was obtained. C2 catheter was advanced into the right pulmonary artery and  exchanged for a Vert catheter. Short-tip superstiff Amplatz wire was placed. 8 French vascular sheath was exchanged for a 24 French sheath. Inari 24 French FlowTriever catheter was advanced over the wire into the distal right pulmonary artery. Aspiration thrombectomy was performed. Large amount  of clot was removed. The aspiration catheter was pulled back to the pulmonary artery bifurcation and a pulmonary arteriogram was performed. Vert catheter was advanced into the left pulmonary arteries using a buddy wire technique. Attempted to advance a curved 20 French Triever into the left pulmonary artery but this was causing the 24 French catheter to retract and buckle. The 20 French catheter was completely removed. The 24 French catheter was advanced into the main left pulmonary artery and aspiration thrombectomy was performed. Large amount of clot was removed. Pulmonary arteriogram was performed in the left pulmonary artery. Additional aspiration thrombectomy was performed. Final pulmonary artery pressure was obtained. The aspiration catheter was completely removed. The right groin sheath was removed using a pursestring suture and external closure device. Right groin hemostasis at the end of the procedure. FINDINGS: Pre thrombectomy pulmonary artery pressure: 33/20, mean 26 mmHg Post thrombectomy pulmonary artery pressure: 28/19, mean 23 mmHg Pulmonary arteriogram demonstrated flow in the main left and right pulmonary arteries after thrombectomy. IMPRESSION: 1. Successful catheter directed mechanical thrombectomy of bilateral pulmonary emboli. 2. Placement of left jugular central venous catheter. Catheter tip at the superior cavoatrial junction Electronically Signed   By: Juliene Balder M.D.   On: 08/26/2024 08:10   IR NON-TUNNELED CENTRAL VENOUS CATH [facility] W IMG Result Date: 08/26/2024 INDICATION: 68 year old with metastatic breast cancer and shortness of breath. CT demonstrates bilateral central pulmonary embolism and  right heart strain. EXAM: 1. Pulmonary arteriography with pressures 2. Pulmonary artery thrombectomy 3. Ultrasound guidance for vascular access x2 4. Placement of non tunneled central venous catheter. COMPARISON:  Chest CTA 08/25/2024 MEDICATIONS: Moderate sedation ANESTHESIA/SEDATION: Moderate (conscious) sedation was employed during this procedure. A total of Versed  4 mg and Fentanyl  100 mcg was administered intravenously by the radiology nurse. Total intra-service moderate Sedation Time: 125 minutes. The patient's level of consciousness and vital signs were monitored continuously by radiology nursing throughout the procedure under my direct supervision. FLUOROSCOPY: Radiation Exposure Index (as provided by the fluoroscopic device): 249 mGy Kerma CONTRAST:  100 mL Omnipaque  300 COMPLICATIONS: None immediate. TECHNIQUE: Informed written consent was obtained from the patient after a thorough discussion of the procedural risks, benefits and alternatives. All questions were addressed. A timeout was performed prior to the initiation of the procedure. Ultrasound demonstrated a patent left internal jugular vein. Ultrasound image was saved for documentation. Left neck was prepped and draped in sterile fashion. Maximal barrier sterile technique was utilized including caps, mask, sterile gowns, sterile gloves, sterile drape, hand hygiene and skin antiseptic. Left neck was anesthetized with 1% lidocaine . Small incision was made. Using ultrasound guidance, 21 gauge needle was directed into the left internal jugular vein. Micropuncture catheter was placed. Bentson wire was advanced into the superior vena cava. Peel-away sheath was placed. Dual lumen power PICC line was cut to an appropriate length and advanced through the peel-away sheath. The tip was placed at the superior cavoatrial junction. Both lumens aspirated and flushed well. Both lumens were capped and clamped. Central line was sutured to skin and a bandage was  placed. Right groin was prepped and draped in sterile fashion. Maximal barrier sterile technique was utilized including caps, mask, sterile gowns, sterile gloves, sterile drape, hand hygiene and skin antiseptic. Ultrasound confirmed a patent right common femoral vein. Ultrasound image was saved for documentation. Skin was anesthetized with 1% lidocaine . Small incision was made. Using ultrasound guidance, 21 gauge needle was directed into the right common femoral vein and micropuncture dilator set was placed. 8 French  vascular sheath was placed over a Bentson wire. Curved pigtail catheter was advanced into the right heart. At this point, patient developed supraclavicular tachycardia. The supraventricular tachycardia was not subsiding and the patient's blood pressure was dropping. Critical care performed electrical cardioversion emergently to get the patient back into normal sinus rhythm. Pigtail catheter was advanced back into the right atrium. It was difficult to advance a pigtail catheter into the right ventricle. Pigtail catheter was exchanged for a C2 catheter. C2 catheter was successfully advanced into the right ventricle and main pulmonary artery using a Bentson wire. Pulmonary artery pressure was obtained. C2 catheter was advanced into the right pulmonary artery and exchanged for a Vert catheter. Short-tip superstiff Amplatz wire was placed. 8 French vascular sheath was exchanged for a 24 French sheath. Inari 24 French FlowTriever catheter was advanced over the wire into the distal right pulmonary artery. Aspiration thrombectomy was performed. Large amount of clot was removed. The aspiration catheter was pulled back to the pulmonary artery bifurcation and a pulmonary arteriogram was performed. Vert catheter was advanced into the left pulmonary arteries using a buddy wire technique. Attempted to advance a curved 20 French Triever into the left pulmonary artery but this was causing the 24 French catheter to  retract and buckle. The 20 French catheter was completely removed. The 24 French catheter was advanced into the main left pulmonary artery and aspiration thrombectomy was performed. Large amount of clot was removed. Pulmonary arteriogram was performed in the left pulmonary artery. Additional aspiration thrombectomy was performed. Final pulmonary artery pressure was obtained. The aspiration catheter was completely removed. The right groin sheath was removed using a pursestring suture and external closure device. Right groin hemostasis at the end of the procedure. FINDINGS: Pre thrombectomy pulmonary artery pressure: 33/20, mean 26 mmHg Post thrombectomy pulmonary artery pressure: 28/19, mean 23 mmHg Pulmonary arteriogram demonstrated flow in the main left and right pulmonary arteries after thrombectomy. IMPRESSION: 1. Successful catheter directed mechanical thrombectomy of bilateral pulmonary emboli. 2. Placement of left jugular central venous catheter. Catheter tip at the superior cavoatrial junction Electronically Signed   By: Juliene Balder M.D.   On: 08/26/2024 08:10   IR US  Guide Vasc Access Right Result Date: 08/26/2024 INDICATION: 68 year old with metastatic breast cancer and shortness of breath. CT demonstrates bilateral central pulmonary embolism and right heart strain. EXAM: 1. Pulmonary arteriography with pressures 2. Pulmonary artery thrombectomy 3. Ultrasound guidance for vascular access x2 4. Placement of non tunneled central venous catheter. COMPARISON:  Chest CTA 08/25/2024 MEDICATIONS: Moderate sedation ANESTHESIA/SEDATION: Moderate (conscious) sedation was employed during this procedure. A total of Versed  4 mg and Fentanyl  100 mcg was administered intravenously by the radiology nurse. Total intra-service moderate Sedation Time: 125 minutes. The patient's level of consciousness and vital signs were monitored continuously by radiology nursing throughout the procedure under my direct supervision.  FLUOROSCOPY: Radiation Exposure Index (as provided by the fluoroscopic device): 249 mGy Kerma CONTRAST:  100 mL Omnipaque  300 COMPLICATIONS: None immediate. TECHNIQUE: Informed written consent was obtained from the patient after a thorough discussion of the procedural risks, benefits and alternatives. All questions were addressed. A timeout was performed prior to the initiation of the procedure. Ultrasound demonstrated a patent left internal jugular vein. Ultrasound image was saved for documentation. Left neck was prepped and draped in sterile fashion. Maximal barrier sterile technique was utilized including caps, mask, sterile gowns, sterile gloves, sterile drape, hand hygiene and skin antiseptic. Left neck was anesthetized with 1% lidocaine . Small incision was made.  Using ultrasound guidance, 21 gauge needle was directed into the left internal jugular vein. Micropuncture catheter was placed. Bentson wire was advanced into the superior vena cava. Peel-away sheath was placed. Dual lumen power PICC line was cut to an appropriate length and advanced through the peel-away sheath. The tip was placed at the superior cavoatrial junction. Both lumens aspirated and flushed well. Both lumens were capped and clamped. Central line was sutured to skin and a bandage was placed. Right groin was prepped and draped in sterile fashion. Maximal barrier sterile technique was utilized including caps, mask, sterile gowns, sterile gloves, sterile drape, hand hygiene and skin antiseptic. Ultrasound confirmed a patent right common femoral vein. Ultrasound image was saved for documentation. Skin was anesthetized with 1% lidocaine . Small incision was made. Using ultrasound guidance, 21 gauge needle was directed into the right common femoral vein and micropuncture dilator set was placed. 8 French vascular sheath was placed over a Bentson wire. Curved pigtail catheter was advanced into the right heart. At this point, patient developed  supraclavicular tachycardia. The supraventricular tachycardia was not subsiding and the patient's blood pressure was dropping. Critical care performed electrical cardioversion emergently to get the patient back into normal sinus rhythm. Pigtail catheter was advanced back into the right atrium. It was difficult to advance a pigtail catheter into the right ventricle. Pigtail catheter was exchanged for a C2 catheter. C2 catheter was successfully advanced into the right ventricle and main pulmonary artery using a Bentson wire. Pulmonary artery pressure was obtained. C2 catheter was advanced into the right pulmonary artery and exchanged for a Vert catheter. Short-tip superstiff Amplatz wire was placed. 8 French vascular sheath was exchanged for a 24 French sheath. Inari 24 French FlowTriever catheter was advanced over the wire into the distal right pulmonary artery. Aspiration thrombectomy was performed. Large amount of clot was removed. The aspiration catheter was pulled back to the pulmonary artery bifurcation and a pulmonary arteriogram was performed. Vert catheter was advanced into the left pulmonary arteries using a buddy wire technique. Attempted to advance a curved 20 French Triever into the left pulmonary artery but this was causing the 24 French catheter to retract and buckle. The 20 French catheter was completely removed. The 24 French catheter was advanced into the main left pulmonary artery and aspiration thrombectomy was performed. Large amount of clot was removed. Pulmonary arteriogram was performed in the left pulmonary artery. Additional aspiration thrombectomy was performed. Final pulmonary artery pressure was obtained. The aspiration catheter was completely removed. The right groin sheath was removed using a pursestring suture and external closure device. Right groin hemostasis at the end of the procedure. FINDINGS: Pre thrombectomy pulmonary artery pressure: 33/20, mean 26 mmHg Post thrombectomy  pulmonary artery pressure: 28/19, mean 23 mmHg Pulmonary arteriogram demonstrated flow in the main left and right pulmonary arteries after thrombectomy. IMPRESSION: 1. Successful catheter directed mechanical thrombectomy of bilateral pulmonary emboli. 2. Placement of left jugular central venous catheter. Catheter tip at the superior cavoatrial junction Electronically Signed   By: Juliene Balder M.D.   On: 08/26/2024 08:10   IR US  Guide Vasc Access Left Result Date: 08/26/2024 INDICATION: 68 year old with metastatic breast cancer and shortness of breath. CT demonstrates bilateral central pulmonary embolism and right heart strain. EXAM: 1. Pulmonary arteriography with pressures 2. Pulmonary artery thrombectomy 3. Ultrasound guidance for vascular access x2 4. Placement of non tunneled central venous catheter. COMPARISON:  Chest CTA 08/25/2024 MEDICATIONS: Moderate sedation ANESTHESIA/SEDATION: Moderate (conscious) sedation was employed during this procedure. A  total of Versed  4 mg and Fentanyl  100 mcg was administered intravenously by the radiology nurse. Total intra-service moderate Sedation Time: 125 minutes. The patient's level of consciousness and vital signs were monitored continuously by radiology nursing throughout the procedure under my direct supervision. FLUOROSCOPY: Radiation Exposure Index (as provided by the fluoroscopic device): 249 mGy Kerma CONTRAST:  100 mL Omnipaque  300 COMPLICATIONS: None immediate. TECHNIQUE: Informed written consent was obtained from the patient after a thorough discussion of the procedural risks, benefits and alternatives. All questions were addressed. A timeout was performed prior to the initiation of the procedure. Ultrasound demonstrated a patent left internal jugular vein. Ultrasound image was saved for documentation. Left neck was prepped and draped in sterile fashion. Maximal barrier sterile technique was utilized including caps, mask, sterile gowns, sterile gloves, sterile  drape, hand hygiene and skin antiseptic. Left neck was anesthetized with 1% lidocaine . Small incision was made. Using ultrasound guidance, 21 gauge needle was directed into the left internal jugular vein. Micropuncture catheter was placed. Bentson wire was advanced into the superior vena cava. Peel-away sheath was placed. Dual lumen power PICC line was cut to an appropriate length and advanced through the peel-away sheath. The tip was placed at the superior cavoatrial junction. Both lumens aspirated and flushed well. Both lumens were capped and clamped. Central line was sutured to skin and a bandage was placed. Right groin was prepped and draped in sterile fashion. Maximal barrier sterile technique was utilized including caps, mask, sterile gowns, sterile gloves, sterile drape, hand hygiene and skin antiseptic. Ultrasound confirmed a patent right common femoral vein. Ultrasound image was saved for documentation. Skin was anesthetized with 1% lidocaine . Small incision was made. Using ultrasound guidance, 21 gauge needle was directed into the right common femoral vein and micropuncture dilator set was placed. 8 French vascular sheath was placed over a Bentson wire. Curved pigtail catheter was advanced into the right heart. At this point, patient developed supraclavicular tachycardia. The supraventricular tachycardia was not subsiding and the patient's blood pressure was dropping. Critical care performed electrical cardioversion emergently to get the patient back into normal sinus rhythm. Pigtail catheter was advanced back into the right atrium. It was difficult to advance a pigtail catheter into the right ventricle. Pigtail catheter was exchanged for a C2 catheter. C2 catheter was successfully advanced into the right ventricle and main pulmonary artery using a Bentson wire. Pulmonary artery pressure was obtained. C2 catheter was advanced into the right pulmonary artery and exchanged for a Vert catheter. Short-tip  superstiff Amplatz wire was placed. 8 French vascular sheath was exchanged for a 24 French sheath. Inari 24 French FlowTriever catheter was advanced over the wire into the distal right pulmonary artery. Aspiration thrombectomy was performed. Large amount of clot was removed. The aspiration catheter was pulled back to the pulmonary artery bifurcation and a pulmonary arteriogram was performed. Vert catheter was advanced into the left pulmonary arteries using a buddy wire technique. Attempted to advance a curved 20 French Triever into the left pulmonary artery but this was causing the 24 French catheter to retract and buckle. The 20 French catheter was completely removed. The 24 French catheter was advanced into the main left pulmonary artery and aspiration thrombectomy was performed. Large amount of clot was removed. Pulmonary arteriogram was performed in the left pulmonary artery. Additional aspiration thrombectomy was performed. Final pulmonary artery pressure was obtained. The aspiration catheter was completely removed. The right groin sheath was removed using a pursestring suture and external closure device. Right  groin hemostasis at the end of the procedure. FINDINGS: Pre thrombectomy pulmonary artery pressure: 33/20, mean 26 mmHg Post thrombectomy pulmonary artery pressure: 28/19, mean 23 mmHg Pulmonary arteriogram demonstrated flow in the main left and right pulmonary arteries after thrombectomy. IMPRESSION: 1. Successful catheter directed mechanical thrombectomy of bilateral pulmonary emboli. 2. Placement of left jugular central venous catheter. Catheter tip at the superior cavoatrial junction Electronically Signed   By: Juliene Balder M.D.   On: 08/26/2024 08:10   DG CHEST PORT 1 VIEW Result Date: 08/26/2024 EXAM: 1 VIEW(S) XRAY OF THE CHEST 08/26/2024 04:10:00 AM COMPARISON: 08/25/2024 CLINICAL HISTORY: Pneumothorax FINDINGS: LINES, TUBES AND DEVICES: Left IJ central venous catheter in place with distal tip  at proximal right atrium. LUNGS AND PLEURA: A trace right apical pneumothorax is present and has decreased from the previous exam. Persistent bilateral pleural effusions, stable to increased in the interval. New mild to moderate interstitial edema with worsening aeration to both lower lung zones. HEART AND MEDIASTINUM: No acute abnormality of the cardiac and mediastinal silhouettes. BONES AND SOFT TISSUES: Thoracic dextrocurvature. No acute osseous abnormality. IMPRESSION: 1. Trace right apical pneumothorax, decreased from the previous exam. 2. New mild to moderate interstitial edema with worsening aeration to both lower lung zones. 3. Bilateral pleural effusions , stable to increased in the interval. Electronically signed by: Waddell Calk MD 08/26/2024 04:38 AM EST RP Workstation: [id number]   CT Angio Chest PE W and/or Wo Contrast Result Date: 08/25/2024 EXAM: CTA of the Chest with contrast for PE 08/25/2024 01:07:37 PM TECHNIQUE: CTA of the chest was performed after the administration of intravenous contrast. Multiplanar reformatted images are provided for review. MIP images are provided for review. Automated exposure control, iterative reconstruction, and/or weight based adjustment of the mA/kV was utilized to reduce the radiation dose to as low as reasonably achievable. COMPARISON: Chest radiograph 08/25/2024. CT chest 07/30/2024. CLINICAL HISTORY: Pulmonary embolism (PE) suspected, high prob. FINDINGS: PULMONARY ARTERIES: Pulmonary arteries are adequately opacified for evaluation. Positive examination for multiple bilateral central and segmental pulmonary emboli including right and left main pulmonary arteries and bilateral upper and lower lobe branches. Moderate to high clot burden. Enlarged main pulmonary artery may indicate pulmonary arterial hypertension. MEDIASTINUM: RA to RV ratio measures 2.6 suggesting likely right heart strain. There is no acute abnormality of the thoracic aorta. Calcification of  the aorta. LYMPH NODES: Mediastinal and bilateral hilar lymphadenopathy. No axillary lymphadenopathy. LUNGS AND PLEURA: Patchy areas of infiltration or consolidation in the lungs. Moderate bilateral pleural effusions with basilar atelectasis or consolidation bilaterally. Small right pneumothorax as seen on prior chest radiographs pse . UPPER ABDOMEN: Limited images of the upper abdomen are unremarkable. SOFT TISSUES AND BONES: Soft tissue mass in the right chest wall measuring at least 4 x 12.1 cm in diameter, incompletely included within the field of view. This appears similar to the prior study. Previous right mastectomy. No acute bone abnormality. Thoracolumbar scoliosis convex towards the left  . NOTE CRITICAL RESULT: Critical results/urgent findings were called at 1:18 pm on 08/25/2024 by radiologist W. Okey Gravely, MD to Lavanda Lesches. IMPRESSION: 1. Multiple bilateral central and segmental pulmonary emboli with moderate to high clot burden and CT evidence of right heart strain (RA:RV ratio 2.6). 2. Small right pneumothorax, seen on prior chest radiographs. 3. Moderate bilateral pleural effusions with basilar atelectasis or consolidation bilaterally. 4. Mediastinal and bilateral hilar lymphadenopathy. 5. Soft tissue mass in the right chest wall, incompletely included within the field of view, similar to prior study.  6. Note critical result. Critical results/urgent findings were called at 1:18 pm on 08/25/2024 by radiologist w. okey gravely, md to [place]. Electronically signed by: Elsie Gravely MD 08/25/2024 01:26 PM EST RP Workstation: [id number]   DG Chest Portable 1 View Result Date: 08/25/2024 CLINICAL DATA:  Shortness of breath. EXAM: PORTABLE CHEST 1 VIEW COMPARISON:  Chest radiograph dated 08/24/2024. FINDINGS: Small left pleural effusion and left lung base atelectasis or infiltrate. Trace right pleural effusion noted. Small right apical pneumothorax minimally increased since the prior  radiograph. Stable cardiac silhouette. Degenerative changes of the spine and scoliosis. No acute osseous pathology. IMPRESSION: 1. Small right apical pneumothorax minimally increased since the prior radiograph. 2. Small left pleural effusion and left lung base atelectasis or infiltrate. Electronically Signed   By: Vanetta Chou M.D.   On: 08/25/2024 11:54   DG Chest 1 View Result Date: 08/24/2024 CLINICAL DATA:  Status post right thoracentesis with suggestion of small apical pneumothorax on immediate postprocedural chest x-ray. EXAM: DG CHEST 1V COMPARISON:  Prior chest x-ray at 1232 hours FINDINGS: Stable suggestion of a less than 5% right apical pneumothorax. Improved aeration at the right lung base. Trace residual right pleural effusion and small to moderate left pleural effusion. Left basilar atelectasis present. IMPRESSION: Stable less than 5% right apical pneumothorax. Improved aeration at the right lung base. Trace residual right pleural effusion and small to moderate left pleural effusion. Electronically Signed   By: Marcey Moan M.D.   On: 08/24/2024 13:55   DG Chest Port 1 View Result Date: 08/24/2024 CLINICAL DATA:  Status post right-sided thoracentesis. EXAM: PORTABLE CHEST 1 VIEW COMPARISON:  One view chest 08/22/2024 FINDINGS: Interval decrease right-sided pleural effusion with a small residual effusion still present. Tiny apical pneumothorax. Left-sided basilar atelectasis and pleural effusion still present. IMPRESSION: Tiny apical pneumothorax. Electronically Signed   By: Cordella Banner   On: 08/24/2024 13:14   US  THORACENTESIS ASP PLEURAL SPACE W/IMG GUIDE Result Date: 08/24/2024 INDICATION: 68 year old female with history of recurrent pleural effusion. Request for therapeutic right thoracentesis. EXAM: ULTRASOUND GUIDED RIGHT THORACENTESIS MEDICATIONS: 10 mL 1% lidocaine . COMPLICATIONS: None immediate. PROCEDURE: An ultrasound guided thoracentesis was thoroughly discussed with  the patient and questions answered. The benefits, risks, alternatives and complications were also discussed. The patient understands and wishes to proceed with the procedure. Written consent was obtained. Ultrasound was performed to localize and mark an adequate pocket of fluid in the right chest. The area was then prepped and draped in the normal sterile fashion. 1% Lidocaine  was used for local anesthesia. Under ultrasound guidance a 6 Fr Safe-T-Centesis catheter was introduced. Thoracentesis was performed. The catheter was removed and a dressing applied. FINDINGS: A total of approximately 400 mL of cloudy, yellow fluid was removed. Samples were sent to the laboratory as requested by the clinical team. IMPRESSION: Successful ultrasound guided right thoracentesis yielding 400 mL of pleural fluid. Performed by: Kacie Matthews PA-C Electronically Signed   By: Cordella Banner   On: 08/24/2024 12:52   DG Chest 1 View Result Date: 08/22/2024 CLINICAL DATA:  Status post left-sided thoracentesis. EXAM: CHEST  1 VIEW COMPARISON:  Chest CT dated 08/21/2024. FINDINGS: Interval decrease in the size of the left pleural effusion post thoracentesis. No pneumothorax. Left lung base atelectasis. Small right pleural effusion is noted. Stable cardiac silhouette. No acute osseous pathology. IMPRESSION: Interval decrease in the size of the left pleural effusion post thoracentesis. No pneumothorax. Electronically Signed   By: Vanetta Chou HERO.D.  On: 08/22/2024 13:21    Labs:  CBC: Recent Labs    08/20/24 0825 08/25/24 1210 08/25/24 1515 08/25/24 1534 08/26/24 0435  WBC 4.2 5.6 5.4  --  4.8  HGB 11.2* 13.1 11.7* 11.9* 10.1*  HCT 33.6* 39.6 35.3* 35.0* 30.9*  PLT 440* 536* 507*  --  507*    COAGS: Recent Labs    08/25/24 1515  INR 1.2    BMP: Recent Labs    08/20/24 0825 08/25/24 1210 08/25/24 1534 08/25/24 2021 08/26/24 0435  NA 137 137 136 138 138  K 3.9 4.7 5.8* 3.8 4.0  CL 103 104 111 108  109  CO2 24 19*  --  19* 20*  GLUCOSE 82 109* 95 99 115*  BUN 22 30* 38* 25* 26*  CALCIUM 8.6* 9.2  --  7.9* 8.0*  CREATININE 0.93 1.00 0.70 0.68 0.81  GFRNONAA >60 >60  --  >60 >60    LIVER FUNCTION TESTS: Recent Labs    05/31/24 0934 08/09/24 1016 08/20/24 0825 08/25/24 2021  BILITOT 0.4 0.4 0.3 0.4  AST 24 20 22 23   ALT 19 9 13 10   ALKPHOS 53 64 65 71  PROT 6.8 6.7 6.6 5.7*  ALBUMIN 4.2 3.3* 3.6 3.1*    Assessment and Plan:  S/p PE thrombectomy with IR 08/25/24 due to bilateral pulmonary emboli - pt feeling better, dyspnea improved. 100% on 3L [place] - Vitals stable - groin access site unremarkable, pt with no pain or hematoma. Will come back tomorrow to remove suture. - pt continues on heparin . critical care/medicine team primarily managing  Please reach out to IR with any questions or concerns  Electronically Signed: Kimble VEAR Clas, PA-C 08/26/2024, 11:50 AM   I spent a total of 15 Minutes at the the patient's bedside AND on the patient's hospital floor or unit, greater than 50% of which was counseling/coordinating care for PE thrombectomy follow up.

## 2024-08-27 ENCOUNTER — Other Ambulatory Visit (HOSPITAL_COMMUNITY): Payer: Self-pay

## 2024-08-27 ENCOUNTER — Telehealth (HOSPITAL_COMMUNITY): Payer: Self-pay | Admitting: Pharmacy Technician

## 2024-08-27 ENCOUNTER — Encounter (HOSPITAL_COMMUNITY): Payer: Self-pay | Admitting: Radiology

## 2024-08-27 ENCOUNTER — Ambulatory Visit: Payer: Self-pay

## 2024-08-27 DIAGNOSIS — J90 Pleural effusion, not elsewhere classified: Secondary | ICD-10-CM

## 2024-08-27 DIAGNOSIS — R578 Other shock: Secondary | ICD-10-CM

## 2024-08-27 LAB — CBC
HCT: 30 % — ABNORMAL LOW (ref 36.0–46.0)
Hemoglobin: 9.6 g/dL — ABNORMAL LOW (ref 12.0–15.0)
MCH: 32.1 pg (ref 26.0–34.0)
MCHC: 32 g/dL (ref 30.0–36.0)
MCV: 100.3 fL — ABNORMAL HIGH (ref 80.0–100.0)
Platelets: 435 K/uL — ABNORMAL HIGH (ref 150–400)
RBC: 2.99 MIL/uL — ABNORMAL LOW (ref 3.87–5.11)
RDW: 15.6 % — ABNORMAL HIGH (ref 11.5–15.5)
WBC: 5.1 K/uL (ref 4.0–10.5)
nRBC: 0 % (ref 0.0–0.2)

## 2024-08-27 LAB — BASIC METABOLIC PANEL WITH GFR
Anion gap: 9 (ref 5–15)
BUN: 21 mg/dL (ref 8–23)
CO2: 19 mmol/L — ABNORMAL LOW (ref 22–32)
Calcium: 8 mg/dL — ABNORMAL LOW (ref 8.9–10.3)
Chloride: 108 mmol/L (ref 98–111)
Creatinine, Ser: 0.69 mg/dL (ref 0.44–1.00)
GFR, Estimated: >60 mL/min (ref >60)
Glucose, Bld: 94 mg/dL (ref 70–99)
Potassium: 3.6 mmol/L (ref 3.5–5.1)
Sodium: 136 mmol/L (ref 135–145)

## 2024-08-27 LAB — LACTATE DEHYDROGENASE: LDH: 251 U/L — ABNORMAL HIGH (ref 105–235)

## 2024-08-27 LAB — HEPARIN LEVEL (UNFRACTIONATED): Heparin Unfractionated: 0.43 [IU]/mL (ref 0.30–0.70)

## 2024-08-27 MED ORDER — SODIUM CHLORIDE 0.9 % IV SOLN: 250.0000 mL | INTRAVENOUS | Status: AC

## 2024-08-27 MED ORDER — POTASSIUM CHLORIDE 10 MEQ/50ML IV SOLN
10.0000 meq | INTRAVENOUS | Status: AC
  Administered 2024-08-27 (×4): 10 meq via INTRAVENOUS
  Filled 2024-08-27 (×4): qty 50

## 2024-08-27 NOTE — Progress Notes (Signed)
 NAME:  Caitlin [name], MRN:  [id number], DOB:  [birth year], LOS: 2 ADMISSION DATE:  08/25/2024, CONSULTATION DATE:  @TODAY @ REFERRING MD:  ED, CHIEF COMPLAINT:  pulmonary embolism   History of Present Illness:  Caitlin [name] is a 68 y/o F with a PMH significant for triple negative metastatic breast cancer who presented for subacute onset shortness of breath found to have submassive pulmonary embolism. CCM consulted for evaluation and management.  The patient notes that she has been struggling with shortness of breath for a while which she notes was related to recurrent pleural effusion. She had 1.1 L drained from her left pleural space on 10/29 and she felt improved after that. The shortness of breath recurred and so she went again for thoracetnesis on 11/19 where another 1.1 L of pleural fluid was drained from left pleural space. She did not feel much improved this time and so another thoracentesis was done on the right side draining 400 mL. She did not feel much better and she noted worsening shortness of breath. Her husband is a family doctor and checked her SPO2 and it was around 86%. Therefore, she was brought in to the ED for evaluation. She was also noted to be tachycardic upon his evaluation. Of note, patient had a long road trip recently which may have been another provoking factor aside form hx of metastatic breast cancer.  Upon arrival to ED, HR in 100s, BP stable off pressors, SPO2 > 90% on 2 L O2 by [place] CTA Chest was ordered showing a small R apical pneumothorax, bilateral central pulmonary emboli, RV dilated suggestive of RV strain  Pertinent  Medical History   Past Medical History:  Diagnosis Date   Asthma    Breast cancer (HCC)    right   Complication of anesthesia    Dyspnea    Dysrhythmia    2012   Headache    History of kidney stones    passed   Hypothyroidism    Pneumonia 01/26/2023   PONV (postoperative nausea and vomiting)    rt breast ca 08/2022   Right Breast    Scoliosis    Thyroid  disease    Significant Hospital Events: Including procedures, antibiotic start and stop dates in addition to other pertinent events   11/22 --> ED for hypoxia and SOB --> submassive PE --> IR for thrombectomy --> unstable SVT, DCCV --> thrombectomy completed 11/24 off levo  Interim History / Subjective:  On 2L [place] Breathing stable  Objective    Blood pressure 103/69, pulse 87, temperature 98 F (36.7 C), resp. rate 19, height 5' 3 (1.6 m), weight 60.9 kg, SpO2 97%.    FiO2 (%):  [32 %] 32 %   Intake/Output Summary (Last 24 hours) at 08/27/2024 0720 Last data filed at 08/27/2024 0425 Gross per 24 hour  Intake 1267.17 ml  Output 450 ml  Net 817.17 ml   Filed Weights   08/25/24 1900 08/26/24 0500  Weight: 59.7 kg 60.9 kg    Examination: General: NAD HEENT: MM pink/moist; [place] in place Neuro: Aox3; MAE CV: s1s2, RRR, no m/r/g PULM:  dim clear BS bilaterally; [place] 2L GI: soft, bsx4 active  Extremities: warm/dry, no edema  Skin: no rashes or lesions   Resolved problem list   Assessment and Plan   Acute Hypoxic Respiratory Failure: 2/2 to below Massive PE: evidence of RV strain on CT chest and my POCUS. PESI score 167, BOVA score 7. 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: Plan: -NPO currently for potential pleur x catheter placement today; will send pleural studies, cytology, culture -trend cxr - Hold heparin  drip several hours prior to procedure - Will need to order PleurX supply from company prior to discharge  Hypothyroidism Plan: -synthroid   Hx of asthma Plan: -pulmicort   -prn albuterol   Anemia Plan: -trend cbc  #Stress Ulcer PPx: PPI  #Nutrition: currently npo for possible procedure today  #VTE ppx: on heparin  as  above  Disposition: ICU appropriate for hemodynamic monitoring and vasopressors  Labs   CBC: Recent Labs  Lab 08/20/24 0825 08/25/24 1210 08/25/24 1515 08/25/24 1534 08/26/24 0435 08/27/24 0310  WBC 4.2 5.6 5.4  --  4.8 5.1  NEUTROABS 3.0  --   --   --   --   --   HGB 11.2* 13.1 11.7* 11.9* 10.1* 9.6*  HCT 33.6* 39.6 35.3* 35.0* 30.9* 30.0*  MCV 96.0 97.3 97.2  --  98.7 100.3*  PLT 440* 536* 507*  --  507* 435*    Basic Metabolic Panel: Recent Labs  Lab 08/20/24 0825 08/25/24 1210 08/25/24 1515 08/25/24 1534 08/25/24 2021 08/26/24 0435 08/27/24 0310  NA 137 137  --  136 138 138 136  K 3.9 4.7  --  5.8* 3.8 4.0 3.6  CL 103 104  --  111 108 109 108  CO2 24 19*  --   --  19* 20* 19*  GLUCOSE 82 109*  --  95 99 115* 94  BUN 22 30*  --  38* 25* 26* 21  CREATININE 0.93 1.00  --  0.70 0.68 0.81 0.69  CALCIUM 8.6* 9.2  --   --  7.9* 8.0* 8.0*  MG  --   --  2.4  --   --   --   --   PHOS  --   --  3.1  --   --   --   --    GFR: Estimated Creatinine Clearance: 56.4 mL/min (by C-G formula based on SCr of 0.69 mg/dL). Recent Labs  Lab 08/25/24 1210 08/25/24 1515 08/25/24 1535 08/26/24 0435 08/27/24 0310  WBC 5.6 5.4  --  4.8 5.1  LATICACIDVEN  --   --  1.6  --   --     Liver Function Tests: Recent Labs  Lab 08/20/24 0825 08/25/24 2021  AST 22 23  ALT 13 10  ALKPHOS 65 71  BILITOT 0.3 0.4  PROT 6.6 5.7*  ALBUMIN 3.6 3.1*   No results for input(s): LIPASE, AMYLASE in the last 168 hours. No results for input(s): AMMONIA in the last 168 hours.  ABG    Component Value Date/Time   TCO2 19 (L) 08/25/2024 1534     Coagulation Profile: Recent Labs  Lab 08/25/24 1515  INR 1.2    Cardiac Enzymes: No results for input(s): CKTOTAL, CKMB, CKMBINDEX, TROPONINI in the last 168 hours.  HbA1C: No results found for: HGBA1C  CBG: No results for input(s): GLUCAP in the last 168 hours.  Review of Systems:   Not obtained.  Past Medical  History:  She,  has a past medical history of Asthma, Breast cancer (HCC), Complication of anesthesia, Dyspnea, Dysrhythmia, Headache, History of kidney stones, Hypothyroidism, Pneumonia (01/26/2023), PONV (postoperative nausea and vomiting), rt breast ca (08/2022), Scoliosis, and Thyroid  disease.  Surgical History:   Past Surgical History:  Procedure Laterality Date   APPENDECTOMY     AXILLARY SENTINEL NODE BIOPSY Right 10/14/2022   Procedure: RIGHT AXILLARY SENTINEL NODE BIOPSY;  Surgeon: Ebbie Cough, MD;  Location: [facility] OR;  Service: General;  Laterality: Right;   BREAST BIOPSY Right 09/15/2022   US  RT BREAST BX W LOC DEV 1ST LESION IMG BX SPEC US  GUIDE 09/15/2022 [facility]   BREAST BIOPSY  10/12/2022   US  RT RADIOACTIVE SEED LOC 10/12/2022 [facility]MOGR[facility]   BREAST LUMPECTOMY WITH RADIOACTIVE SEED AND SENTINEL LYMPH NODE BIOPSY Right 10/14/2022   Procedure: RIGHT BREAST LUMPECTOMY WITH RADIOACTIVE SEED;  Surgeon: Ebbie Cough, MD;  Location: [facility] OR;  Service: General;  Laterality: Right;   COLONOSCOPY     IR THROMBECT VENO MECH MOD SED  08/25/2024   IR TUNNELED CENTRAL VENOUS CATH PLC W IMG  08/25/2024   IR US  GUIDE VASC ACCESS LEFT  08/25/2024   IR US  GUIDE VASC ACCESS RIGHT  08/25/2024   LAPAROSCOPIC APPENDECTOMY     MASTECTOMY Right    PORT-A-CATH REMOVAL Left 05/03/2023   Procedure: REMOVAL PORT-A-CATH;  Surgeon: Ebbie Cough, MD;  Location: [facility] OR;  Service: General;  Laterality: Left;   PORT-A-CATH REMOVAL Left 12/22/2023   Procedure: REMOVAL PORT-A-CATH;  Surgeon: Ebbie Cough, MD;  Location: [facility];  Service: General;  Laterality: Left;   PORTACATH PLACEMENT Left 10/14/2022   Procedure: INSERTION PORT-A-CATH;  Surgeon: Ebbie Cough, MD;  Location: [facility] OR;  Service: General;  Laterality: Left;   PORTACATH PLACEMENT Left 05/19/2023   Procedure: PORT PLACEMENT WITH ULTRASOUND GUIDANCE;  Surgeon: Ebbie Cough, MD;   Location: [facility];  Service: General;  Laterality: Left;   RE-EXCISION OF BREAST LUMPECTOMY Right 11/08/2022   Procedure: RE-EXCISION OF RIGHT BREAST LUMPECTOMY;  Surgeon: Ebbie Cough, MD;  Location:  SURGERY CENTER;  Service: General;  Laterality: Right;   RE-EXCISION OF BREAST LUMPECTOMY Right 11/18/2022   Procedure: RE-EXCISION OF RIGHT BREAST LUMPECTOMY;  Surgeon: Ebbie Cough, MD;  Location: [facility] OR;  Service: General;  Laterality: Right;   SIMPLE MASTECTOMY WITH AXILLARY SENTINEL NODE BIOPSY Right 05/03/2023   Procedure: RIGHT MASTECTOMY;  Surgeon: Ebbie Cough, MD;  Location: [facility] OR;  Service: General;  Laterality: Right;  GEN & PEC BLOCK   WISDOM TOOTH EXTRACTION       Social History:   reports that she has never smoked. She does not have any smokeless tobacco history on file. She reports that she does not currently use alcohol. She reports that she does not use drugs.   Family History:  Her family history includes Asthma in her sister; Clotting disorder in her paternal grandmother; Heart attack in her maternal grandfather and maternal grandmother; Hypertension in her father; Prostate cancer (age of onset: 62) in her brother; Prostate cancer (age of onset: 29) in her father; Thyroid  disease in her mother and sister.   Allergies Allergies  Allergen Reactions   Penicillins Rash    childhood reaction  Did it involve swelling of the face/tongue/throat, SOB, or low BP? No Did it involve sudden or severe rash/hives, skin peeling, or any reaction on the inside of your mouth or nose? Yes Did you need to seek medical attention at a hospital or doctor's office? No When did it last happen?      childhood If all above answers are NO, may proceed with cephalosporin use.       Home Medications  Prior to Admission medications  Medication Sig Start Date End Date Taking? Authorizing Provider  albuterol  (VENTOLIN  HFA) 108 (90 Base) MCG/ACT inhaler  Inhale 2 puffs into the lungs every 4 (four) hours as needed for shortness of breath (Asthma). 05/30/22   [provider]  alendronate  (FOSAMAX ) 70 MG tablet Take 1 tablet (70 mg total) by mouth once a week. Take with a full glass of water  on an empty stomach. 12/21/23   Gudena, Vinay, MD  aspirin 81 MG tablet Take 81 mg by mouth daily.      [provider]  dexamethasone  (DECADRON ) 4 MG tablet Take 1 tablets  by mouth daily for 3 days starting the day after chemotherapy. Take with food. 07/25/24   Gudena, Vinay, MD  diltiazem  (CARDIZEM  CD) 120 MG 24 hr capsule Take 120 mg by mouth daily.    [provider]  levothyroxine  (SYNTHROID ) 75 MCG tablet Take 75 mcg by mouth daily before breakfast.    [provider]  lidocaine -prilocaine  (EMLA ) cream Apply to affected area once 07/25/24   Gudena, Vinay, MD  Multiple Vitamins-Minerals (MULTIVITAMIN WITH MINERALS) tablet Take 1 tablet by mouth daily.    [provider]  ondansetron  (ZOFRAN ) 8 MG tablet Take 1 tablet (8 mg total) by mouth every 8 (eight) hours as needed for nausea or vomiting. Start on the third day after chemotherapy. 07/25/24   Odean Potts, MD  OVER THE COUNTER MEDICATION Take 1 capsule by mouth 2 (two) times daily. copaiba softgels    [provider]  oxyCODONE  (OXY IR/ROXICODONE ) 5 MG immediate release tablet Take 1 tablet (5 mg total) by mouth every 6 (six) hours as needed for severe pain (pain score 7-10). 07/30/24   Gudena, Vinay, MD  prochlorperazine  (COMPAZINE ) 10 MG tablet Take 1 tablet (10 mg total) by mouth every 6 (six) hours as needed for nausea or vomiting. 08/14/24   Odean Potts, MD  PULMICORT  FLEXHALER 180 MCG/ACT inhaler Inhale 1 puff into the lungs daily.    [provider]     CCT: 35 minutes  JD Emilio DEVONNA [facility] 08/27/2024, 9:08 AM  Please see [email or web address] for pager details.  From 7A-7P if no response, please call  [phone]. After hours, please call ELink [phone].

## 2024-08-27 NOTE — Progress Notes (Signed)
 [facility] ADULT ICU REPLACEMENT PROTOCOL   The patient does apply for the [facility] Adult ICU Electrolyte Replacment Protocol based on the criteria listed below:   1.Exclusion criteria: TCTS, ECMO, Dialysis, and Myasthenia Gravis patients 2. Is GFR >/= 30 ml/min? Yes.    [name] GFR today is >60 3. Is SCr </= 2? Yes.   [name] SCr is 0.69 mg/dL 4. Did SCr increase >/= 0.5 in 24 hours? No. 5.Pt's weight >40kg  Yes.   6. Abnormal electrolyte(s): K  7. Electrolytes replaced per protocol 8.  Call MD STAT for K+ </= 2.5, Phos </= 1, or Mag </= 1 Physician:  Haze Hunter BRAVO Jada Kuhnert 08/27/2024 5:51 AM

## 2024-08-27 NOTE — Progress Notes (Signed)
 PHARMACY - ANTICOAGULATION CONSULT NOTE  Pharmacy Consult for heparin  Indication: pulmonary embolus  Allergies  Allergen Reactions   Penicillins Rash    childhood reaction  Did it involve swelling of the face/tongue/throat, SOB, or low BP? No Did it involve sudden or severe rash/hives, skin peeling, or any reaction on the inside of your mouth or nose? Yes Did you need to seek medical attention at a hospital or doctor's office? No When did it last happen?      childhood If all above answers are NO, may proceed with cephalosporin use.      Patient Measurements: Height: 5' 3 (160 cm) Weight: 60.9 kg (134 lb 4.2 oz) IBW/kg (Calculated) : 52.4 HEPARIN  DW (KG): 59.7  Vital Signs: Temp: 97.7 F (36.5 C) (11/23 2100) Temp Source: Oral (11/23 1715) BP: 101/62 (11/24 0315) Pulse Rate: 85 (11/24 0315)  Labs: Recent Labs    08/25/24 1515 08/25/24 1534 08/25/24 2021 08/25/24 2021 08/26/24 0435 08/26/24 1200 08/26/24 1930 08/27/24 0310  HGB 11.7* 11.9*  --   --  10.1*  --   --  9.6*  HCT 35.3* 35.0*  --   --  30.9*  --   --  30.0*  PLT 507*  --   --   --  507*  --   --  435*  LABPROT 16.2*  --   --   --   --   --   --   --   INR 1.2  --   --   --   --   --   --   --   HEPARINUNFRC  --   --  >1.10*   < > 0.22* 0.18* 0.35 0.43  CREATININE  --  0.70 0.68  --  0.81  --   --   --    < > = values in this interval not displayed.    Estimated Creatinine Clearance: 55.8 mL/min (by C-G formula based on SCr of 0.81 mg/dL).   Medical History: Past Medical History:  Diagnosis Date   Asthma    Breast cancer (HCC)    right   Complication of anesthesia    Dyspnea    Dysrhythmia    2012   Headache    History of kidney stones    passed   Hypothyroidism    Pneumonia 01/26/2023   PONV (postoperative nausea and vomiting)    rt breast ca 08/2022   Right Breast   Scoliosis    Thyroid  disease     Medications: No anticoagulants PTA  Assessment: Pt is a 68yoF with PMH  significant for metastatic breast cancer, recurrent pleural effusion who presented with SOB. CTA revealed multiple bilateral segmental PE with right heart strain. Pt underwent bilateral thrombectomy by IR on 08/25/24.   Pharmacy consulted to dose heparin  for PE.   Today, 08/27/24 Heparin  level = 0.43, therapeutic on heparin  infusion of 1100 units/hr CBC: Hgb low (9.6), Plt elevated (435k) No bleeding or complications noted  Goal of Therapy:  Heparin  level 0.3-0.7 units/ml Monitor platelets by anticoagulation protocol: Yes   Plan:  Continue IV heparin  at 1100 units/hr Monitor daily heparin  level, CBC, signs/symptoms of bleeding  Thank you for allowing pharmacy to be a part of this patient's care.  Leann Poindexter, PharmD 08/27/2024 3:34 AM

## 2024-08-27 NOTE — Telephone Encounter (Signed)
 Scheduled for 12/19 at [facility]. Nothing available sooner at [facility] either. Letter mailed to patient

## 2024-08-27 NOTE — Telephone Encounter (Signed)
 Patient Product/process development scientist completed.    The patient is insured through [employer]/ [employer]. Patient has Medicare and is not eligible for a copay card, but may be able to apply for patient assistance or Medicare RX Payment Plan (Patient Must reach out to their plan, if eligible for payment plan), if available.    Ran test claim for Eliquis 5 mg and the current 30 day co-pay is $47.00.  Ran test claim for Xarelto 20 mg and the current 30 day co-pay is $47.00.  This test claim was processed through [facility]- copay amounts may vary at other pharmacies due to pharmacy/plan contracts, or as the patient moves through the different stages of their insurance plan.     Reyes Sharps, CPHT Pharmacy Technician Patient Advocate Specialist Lead [facility] Patient Advocate Team Direct Number: [phone]  Fax: [phone]

## 2024-08-27 NOTE — Plan of Care (Signed)
  Problem: Clinical Measurements: Goal: Ability to maintain clinical measurements within normal limits will improve Outcome: Progressing Goal: Will remain free from infection Outcome: Progressing Goal: Diagnostic test results will improve Outcome: Progressing Goal: Respiratory complications will improve Outcome: Progressing Goal: Cardiovascular complication will be avoided Outcome: Progressing   Problem: Activity: Goal: Risk for activity intolerance will decrease Outcome: Progressing   Problem: Nutrition: Goal: Adequate nutrition will be maintained Outcome: Progressing   Problem: Coping: Goal: Level of anxiety will decrease Outcome: Progressing   Problem: Elimination: Goal: Will not experience complications related to bowel motility Outcome: Progressing

## 2024-08-27 NOTE — Plan of Care (Signed)
  Problem: Education: Goal: Knowledge of General Education information will improve Description: Including pain rating scale, medication(s)/side effects and non-pharmacologic comfort measures Outcome: Progressing   Problem: Health Behavior/Discharge Planning: Goal: Ability to manage health-related needs will improve Outcome: Progressing   Problem: Clinical Measurements: Goal: Ability to maintain clinical measurements within normal limits will improve Outcome: Progressing Goal: Will remain free from infection Outcome: Progressing Goal: Diagnostic test results will improve Outcome: Progressing Goal: Respiratory complications will improve Outcome: Progressing Goal: Cardiovascular complication will be avoided Outcome: Progressing   Problem: Coping: Goal: Level of anxiety will decrease Outcome: Progressing   Problem: Elimination: Goal: Will not experience complications related to bowel motility Outcome: Progressing Goal: Will not experience complications related to urinary retention Outcome: Progressing   Problem: Pain Managment: Goal: General experience of comfort will improve and/or be controlled Outcome: Progressing   Problem: Safety: Goal: Ability to remain free from injury will improve Outcome: Progressing   Problem: Skin Integrity: Goal: Risk for impaired skin integrity will decrease Outcome: Progressing   Problem: Education: Goal: Understanding of CV disease, CV risk reduction, and recovery process will improve Outcome: Progressing Goal: Individualized Educational Video(s) Outcome: Progressing   Problem: Cardiovascular: Goal: Ability to achieve and maintain adequate cardiovascular perfusion will improve Outcome: Progressing Goal: Vascular access site(s) Level 0-1 will be maintained Outcome: Progressing   Problem: Health Behavior/Discharge Planning: Goal: Ability to safely manage health-related needs after discharge will improve Outcome: Progressing    Problem: Activity: Goal: Risk for activity intolerance will decrease Outcome: Not Progressing   Problem: Nutrition: Goal: Adequate nutrition will be maintained Outcome: Not Progressing   Problem: Activity: Goal: Ability to return to baseline activity level will improve Outcome: Not Progressing

## 2024-08-27 NOTE — Progress Notes (Signed)
 Patient ID: Caitlin [name], female   DOB: 1956/08/07, 68 y.o.   MRN: [id number] Patient feeling better today since pulmonary thrombectomy done over weekend.  Sitting up in bed.  About to eat dinner.  Right common femoral vein closure device removed in its entirety without immediate complications.  No hematoma at site.  Request noted for possible PleurX catheter placement.  Will plan to review imaging with Dr. Hughes in a.m. to determine plan.  Patient remains on IV heparin 

## 2024-08-28 ENCOUNTER — Inpatient Hospital Stay (HOSPITAL_COMMUNITY)

## 2024-08-28 DIAGNOSIS — J9601 Acute respiratory failure with hypoxia: Secondary | ICD-10-CM

## 2024-08-28 DIAGNOSIS — R918 Other nonspecific abnormal finding of lung field: Secondary | ICD-10-CM | POA: Diagnosis not present

## 2024-08-28 DIAGNOSIS — J939 Pneumothorax, unspecified: Secondary | ICD-10-CM | POA: Diagnosis not present

## 2024-08-28 DIAGNOSIS — I517 Cardiomegaly: Secondary | ICD-10-CM | POA: Diagnosis not present

## 2024-08-28 DIAGNOSIS — J90 Pleural effusion, not elsewhere classified: Secondary | ICD-10-CM | POA: Diagnosis not present

## 2024-08-28 HISTORY — PX: IR PERC PLEURAL DRAIN W/INDWELL CATH W/IMG GUIDE: IMG5383

## 2024-08-28 LAB — HEPARIN LEVEL (UNFRACTIONATED): Heparin Unfractionated: 0.47 [IU]/mL (ref 0.30–0.70)

## 2024-08-28 LAB — CBC
HCT: 28.8 % — ABNORMAL LOW (ref 36.0–46.0)
Hemoglobin: 9.1 g/dL — ABNORMAL LOW (ref 12.0–15.0)
MCH: 31.9 pg (ref 26.0–34.0)
MCHC: 31.6 g/dL (ref 30.0–36.0)
MCV: 101.1 fL — ABNORMAL HIGH (ref 80.0–100.0)
Platelets: 445 K/uL — ABNORMAL HIGH (ref 150–400)
RBC: 2.85 MIL/uL — ABNORMAL LOW (ref 3.87–5.11)
RDW: 15.7 % — ABNORMAL HIGH (ref 11.5–15.5)
WBC: 5.4 K/uL (ref 4.0–10.5)
nRBC: 0 % (ref 0.0–0.2)

## 2024-08-28 LAB — LACTATE DEHYDROGENASE, PLEURAL OR PERITONEAL FLUID: LD, Fluid: 437 U/L — ABNORMAL HIGH (ref 3–23)

## 2024-08-28 LAB — GLUCOSE, PLEURAL OR PERITONEAL FLUID: Glucose, Fluid: 52 mg/dL

## 2024-08-28 LAB — PROTEIN, PLEURAL OR PERITONEAL FLUID: Total protein, fluid: 3.1 g/dL

## 2024-08-28 MED ORDER — APIXABAN 5 MG PO TABS
10.0000 mg | ORAL_TABLET | Freq: Two times a day (BID) | ORAL | Status: DC
  Administered 2024-08-28 – 2024-08-29 (×2): 10 mg via ORAL
  Filled 2024-08-28 (×2): qty 2

## 2024-08-28 MED ORDER — MIDAZOLAM HCL (PF) 2 MG/2ML IJ SOLN
INTRAMUSCULAR | Status: DC | PRN
  Administered 2024-08-28: 1 mg via INTRAVENOUS

## 2024-08-28 MED ORDER — DILTIAZEM HCL ER COATED BEADS 120 MG PO CP24
120.0000 mg | ORAL_CAPSULE | Freq: Every day | ORAL | Status: DC
  Administered 2024-08-28 – 2024-08-29 (×2): 120 mg via ORAL
  Filled 2024-08-28 (×2): qty 1

## 2024-08-28 MED ORDER — MIDAZOLAM HCL 2 MG/2ML IJ SOLN
INTRAMUSCULAR | Status: AC
Start: 2024-08-28 — End: 2024-08-28
  Filled 2024-08-28: qty 2

## 2024-08-28 MED ORDER — APIXABAN 5 MG PO TABS: 5.0000 mg | ORAL_TABLET | Freq: Two times a day (BID) | ORAL | Status: DC

## 2024-08-28 MED ORDER — CEFAZOLIN SODIUM-DEXTROSE 2-4 GM/100ML-% IV SOLN
INTRAVENOUS | Status: DC | PRN
  Administered 2024-08-28: 2 g via INTRAVENOUS

## 2024-08-28 MED ORDER — LIDOCAINE-EPINEPHRINE 1 %-1:100000 IJ SOLN
20.0000 mL | Freq: Once | INTRAMUSCULAR | Status: AC
  Administered 2024-08-28: 20 mL via INTRADERMAL

## 2024-08-28 MED ORDER — LIDOCAINE HCL 1 % IJ SOLN
INTRAMUSCULAR | Status: AC
Start: 2024-08-28 — End: 2024-08-28
  Filled 2024-08-28: qty 20

## 2024-08-28 MED ORDER — CEFAZOLIN SODIUM-DEXTROSE 2-4 GM/100ML-% IV SOLN
INTRAVENOUS | Status: AC
  Filled 2024-08-28: qty 100

## 2024-08-28 MED ORDER — CEFAZOLIN SODIUM-DEXTROSE 2-4 GM/100ML-% IV SOLN: 2.0000 g | INTRAVENOUS | Status: AC

## 2024-08-28 MED ORDER — FENTANYL CITRATE (PF) 100 MCG/2ML IJ SOLN
INTRAMUSCULAR | Status: DC | PRN
  Administered 2024-08-28: 50 ug via INTRAVENOUS

## 2024-08-28 MED ORDER — LIDOCAINE-EPINEPHRINE 1 %-1:100000 IJ SOLN
INTRAMUSCULAR | Status: AC
  Filled 2024-08-28: qty 1

## 2024-08-28 MED ORDER — FENTANYL CITRATE (PF) 100 MCG/2ML IJ SOLN
INTRAMUSCULAR | Status: AC
  Filled 2024-08-28: qty 2

## 2024-08-28 NOTE — Progress Notes (Addendum)
 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), dyspnea, recent pulmonary emboli/thrombectomy; referred for left pleurx catheter placement   Subjective: Pt doing ok today; denies fever,HA,CP,cough, abd/back pain,N/V or bleeding; she does have some dyspnea with exertion    Past Medical History:  Diagnosis Date   Asthma    Breast cancer (HCC)    right   Complication of anesthesia    Dyspnea    Dysrhythmia    2012   Headache    History of kidney stones    passed   Hypothyroidism    Pneumonia 01/26/2023   PONV (postoperative nausea and vomiting)    rt breast ca 08/2022   Right Breast   Scoliosis    Thyroid  disease    Past Surgical History:  Procedure Laterality Date   APPENDECTOMY     AXILLARY SENTINEL NODE BIOPSY Right 10/14/2022   Procedure: RIGHT AXILLARY SENTINEL NODE BIOPSY;  Surgeon: Ebbie Cough, MD;  Location: [facility] OR;  Service: General;  Laterality: Right;   BREAST BIOPSY Right 09/15/2022   US  RT BREAST BX W LOC DEV 1ST LESION IMG BX SPEC US  GUIDE 09/15/2022 [facility]   BREAST BIOPSY  10/12/2022   US  RT RADIOACTIVE SEED LOC 10/12/2022 [facility]   BREAST LUMPECTOMY WITH RADIOACTIVE SEED AND SENTINEL LYMPH NODE BIOPSY Right 10/14/2022   Procedure: RIGHT BREAST LUMPECTOMY WITH RADIOACTIVE SEED;  Surgeon: Ebbie Cough, MD;  Location: [facility] OR;  Service: General;  Laterality: Right;   COLONOSCOPY     IR THROMBECT PRIM MECH INIT (INCLU) MOD SED  08/25/2024   IR TUNNELED CENTRAL VENOUS CATH PLC W IMG  08/25/2024   IR US  GUIDE VASC ACCESS LEFT  08/25/2024   IR US  GUIDE VASC ACCESS RIGHT  08/25/2024   LAPAROSCOPIC APPENDECTOMY     MASTECTOMY Right    PORT-A-CATH REMOVAL Left 05/03/2023   Procedure: REMOVAL PORT-A-CATH;  Surgeon: Ebbie Cough, MD;  Location: [facility] OR;  Service:  General;  Laterality: Left;   PORT-A-CATH REMOVAL Left 12/22/2023   Procedure: REMOVAL PORT-A-CATH;  Surgeon: Ebbie Cough, MD;  Location: [facility];  Service: General;  Laterality: Left;   PORTACATH PLACEMENT Left 10/14/2022   Procedure: INSERTION PORT-A-CATH;  Surgeon: Ebbie Cough, MD;  Location: [facility] OR;  Service: General;  Laterality: Left;   PORTACATH PLACEMENT Left 05/19/2023   Procedure: PORT PLACEMENT WITH ULTRASOUND GUIDANCE;  Surgeon: Ebbie Cough, MD;  Location: [facility];  Service: General;  Laterality: Left;   RE-EXCISION OF BREAST LUMPECTOMY Right 11/08/2022   Procedure: RE-EXCISION OF RIGHT BREAST LUMPECTOMY;  Surgeon: Ebbie Cough, MD;  Location:  SURGERY CENTER;  Service: General;  Laterality: Right;   RE-EXCISION OF BREAST LUMPECTOMY Right 11/18/2022   Procedure: RE-EXCISION OF RIGHT BREAST LUMPECTOMY;  Surgeon: Ebbie Cough, MD;  Location: [facility] OR;  Service: General;  Laterality: Right;   SIMPLE MASTECTOMY WITH AXILLARY SENTINEL NODE BIOPSY Right 05/03/2023   Procedure: RIGHT MASTECTOMY;  Surgeon: Ebbie Cough, MD;  Location: [facility] OR;  Service: General;  Laterality: Right;  GEN & PEC BLOCK   WISDOM TOOTH EXTRACTION        Allergies: Penicillins  Medications: Prior to Admission medications   Medication Sig Start Date End Date Taking? Authorizing Provider  albuterol  (VENTOLIN  HFA) 108 (90 Base) MCG/ACT inhaler Inhale 2 puffs into the lungs every 4 (four) hours as needed for shortness of breath (  Asthma). 05/30/22  Yes [provider]  alendronate  (FOSAMAX ) 70 MG tablet Take 1 tablet (70 mg total) by mouth once a week. Take with a full glass of water  on an empty stomach. 12/21/23  Yes Gudena, Vinay, MD  aspirin 81 MG tablet Take 81 mg by mouth daily.     Yes [provider]  cetirizine (ZYRTEC) 10 MG tablet Take 10 mg by mouth daily.   Yes [provider]  dexamethasone   (DECADRON ) 4 MG tablet Take 1 tablets  by mouth daily for 3 days starting the day after chemotherapy. Take with food. 07/25/24  Yes Gudena, Vinay, MD  diltiazem  (CARDIZEM  CD) 120 MG 24 hr capsule Take 120 mg by mouth daily.   Yes [provider]  levothyroxine  (SYNTHROID ) 75 MCG tablet Take 75 mcg by mouth daily before breakfast.   Yes [provider]  Multiple Vitamins-Minerals (MULTIVITAMIN WITH MINERALS) tablet Take 1 tablet by mouth daily.   Yes [provider]  ondansetron  (ZOFRAN ) 8 MG tablet Take 1 tablet (8 mg total) by mouth every 8 (eight) hours as needed for nausea or vomiting. Start on the third day after chemotherapy. 07/25/24  Yes Odean Potts, MD  OVER THE COUNTER MEDICATION Take 1 capsule by mouth 2 (two) times daily. copaiba softgels   Yes [provider]  oxyCODONE  (OXY IR/ROXICODONE ) 5 MG immediate release tablet Take 1 tablet (5 mg total) by mouth every 6 (six) hours as needed for severe pain (pain score 7-10). 07/30/24  Yes Gudena, Vinay, MD  prochlorperazine  (COMPAZINE ) 10 MG tablet Take 1 tablet (10 mg total) by mouth every 6 (six) hours as needed for nausea or vomiting. 08/14/24  Yes Odean Potts, MD  PULMICORT  FLEXHALER 180 MCG/ACT inhaler Inhale 1 puff into the lungs daily.   Yes [provider]  lidocaine -prilocaine  (EMLA ) cream Apply to affected area once Patient not taking: Reported on 08/25/2024 07/25/24   Odean Potts, MD     Vital Signs: BP 110/67 (BP Location: Left Arm)   Pulse 92   Temp 98.5 F (36.9 C) (Oral)   Resp (!) 21   Ht 5' 3 (1.6 m)   Wt 134 lb 7.7 oz (61 kg)   SpO2 97%   BMI 23.82 kg/m   Physical Exam: awake/alert; chest- dim BS bases bilat; heart- RRR; abd-soft,+BS,NT; no LE edema  Imaging: DG Chest 1 View Result Date: 08/28/2024 EXAM: 1 VIEW(S) XRAY OF THE CHEST 08/28/2024 05:10:00 AM COMPARISON: Portable chest 08/26/2024 at 4:04 am. CLINICAL HISTORY: Pleural effusion, bilateral. FINDINGS:  LINES, TUBES AND DEVICES: Left IJ central line terminates at the superior cavoatrial junction. LUNGS AND PLEURA: A very small right pneumothorax again is noted with no interval worsening. There are persisting moderate-sized pleural effusions greater on the left. No focal pulmonary opacity. HEART AND MEDIASTINUM: There is cardiomegaly and central vascular prominence without appreciable edema. Stable mediastinum. BONES AND SOFT TISSUES: Right axillary surgical clips. There is moderate thoracic dextroscoliosis with degenerative changes. No acute osseous abnormality. IMPRESSION: 1. Very small right pneumothorax, stable. 2. Persisting moderate-sized pleural effusions, greater on the left also unchanged , overlying lung opacities. 3. Cardiomegaly and central vascular prominence without appreciable edema. Electronically signed by: Francis Quam MD 08/28/2024 06:27 AM EST RP Workstation: [id number]   ECHOCARDIOGRAM LIMITED Result Date: 08/26/2024    ECHOCARDIOGRAM LIMITED REPORT   Patient Name:   JUANNA PUDLO Date of Exam: 08/26/2024 Medical Rec #:  [id number]        Height:  63.0 in Accession #:    [id number]       Weight:       134.3 lb Date of Birth:  10/30/1955       BSA:          1.632 m Patient Age:    68 years         BP:           94/60 mmHg Patient Gender: F                HR:           79 bpm. Exam Location:  Inpatient Procedure: 2D Echo, Color Doppler, Cardiac Doppler and Limited Echo (Both            Spectral and Color Flow Doppler were utilized during procedure). Indications:    Pulmonary Embolus I26.09  History:        Patient has prior history of Echocardiogram examinations, most                 recent 07/27/2024.  Sonographer:    Tinnie Gosling RDCS Referring Phys: [id number] PAULA SOUTHERLY IMPRESSIONS  1. Left ventricular ejection fraction, by estimation, is 60 to 65%. The left ventricle has normal function. There is mild left ventricular hypertrophy.  2. Right ventricular systolic function is  mildly reduced. The right ventricular size is mildly enlarged. There is normal pulmonary artery systolic pressure. The estimated right ventricular systolic pressure is 28.8 mmHg.  3. The aortic valve was not well visualized.  4. The inferior vena cava is normal in size with <50% respiratory variability, suggesting right atrial pressure of 8 mmHg. FINDINGS  Left Ventricle: Left ventricular ejection fraction, by estimation, is 60 to 65%. The left ventricle has normal function. There is mild left ventricular hypertrophy. Right Ventricle: The right ventricular size is mildly enlarged. Right ventricular systolic function is mildly reduced. There is normal pulmonary artery systolic pressure. The tricuspid regurgitant velocity is 2.28 m/s, and with an assumed right atrial pressure of 8 mmHg, the estimated right ventricular systolic pressure is 28.8 mmHg. Left Atrium: Left atrial size was normal in size. Tricuspid Valve: The tricuspid valve is grossly normal. Tricuspid valve regurgitation is mild. Aortic Valve: The aortic valve was not well visualized. Pulmonic Valve: Pulmonic valve regurgitation is trivial. Venous: The inferior vena cava is normal in size with less than 50% respiratory variability, suggesting right atrial pressure of 8 mmHg. LEFT VENTRICLE PLAX 2D LVIDd:         3.60 cm LVIDs:         1.70 cm LV PW:         1.30 cm LV IVS:        1.30 cm  RIGHT VENTRICLE            IVC RV S prime:     9.03 cm/s  IVC diam: 1.80 cm TAPSE (M-mode): 1.7 cm RIGHT ATRIUM           Index RA Area:     12.30 cm RA Volume:   31.30 ml  19.17 ml/m  TRICUSPID VALVE TR Peak grad:   20.8 mmHg TR Vmax:        228.00 cm/s Soyla Merck MD Electronically signed by Soyla Merck MD Signature Date/Time: 08/26/2024/9:13:19 AM    Final    IR NON-TUNNELED CENTRAL VENOUS CATH [facility] W IMG Result Date: 08/26/2024 INDICATION: 68 year old with metastatic breast cancer and shortness of breath. CT demonstrates bilateral central pulmonary  embolism and  right heart strain. EXAM: 1. Pulmonary arteriography with pressures 2. Pulmonary artery thrombectomy 3. Ultrasound guidance for vascular access x2 4. Placement of non tunneled central venous catheter. COMPARISON:  Chest CTA 08/25/2024 MEDICATIONS: Moderate sedation ANESTHESIA/SEDATION: Moderate (conscious) sedation was employed during this procedure. A total of Versed  4 mg and Fentanyl  100 mcg was administered intravenously by the radiology nurse. Total intra-service moderate Sedation Time: 125 minutes. The patient's level of consciousness and vital signs were monitored continuously by radiology nursing throughout the procedure under my direct supervision. FLUOROSCOPY: Radiation Exposure Index (as provided by the fluoroscopic device): 249 mGy Kerma CONTRAST:  100 mL Omnipaque  300 COMPLICATIONS: None immediate. TECHNIQUE: Informed written consent was obtained from the patient after a thorough discussion of the procedural risks, benefits and alternatives. All questions were addressed. A timeout was performed prior to the initiation of the procedure. Ultrasound demonstrated a patent left internal jugular vein. Ultrasound image was saved for documentation. Left neck was prepped and draped in sterile fashion. Maximal barrier sterile technique was utilized including caps, mask, sterile gowns, sterile gloves, sterile drape, hand hygiene and skin antiseptic. Left neck was anesthetized with 1% lidocaine . Small incision was made. Using ultrasound guidance, 21 gauge needle was directed into the left internal jugular vein. Micropuncture catheter was placed. Bentson wire was advanced into the superior vena cava. Peel-away sheath was placed. Dual lumen power PICC line was cut to an appropriate length and advanced through the peel-away sheath. The tip was placed at the superior cavoatrial junction. Both lumens aspirated and flushed well. Both lumens were capped and clamped. Central line was sutured to skin and a  bandage was placed. Right groin was prepped and draped in sterile fashion. Maximal barrier sterile technique was utilized including caps, mask, sterile gowns, sterile gloves, sterile drape, hand hygiene and skin antiseptic. Ultrasound confirmed a patent right common femoral vein. Ultrasound image was saved for documentation. Skin was anesthetized with 1% lidocaine . Small incision was made. Using ultrasound guidance, 21 gauge needle was directed into the right common femoral vein and micropuncture dilator set was placed. 8 French vascular sheath was placed over a Bentson wire. Curved pigtail catheter was advanced into the right heart. At this point, patient developed supraclavicular tachycardia. The supraventricular tachycardia was not subsiding and the patient's blood pressure was dropping. Critical care performed electrical cardioversion emergently to get the patient back into normal sinus rhythm. Pigtail catheter was advanced back into the right atrium. It was difficult to advance a pigtail catheter into the right ventricle. Pigtail catheter was exchanged for a C2 catheter. C2 catheter was successfully advanced into the right ventricle and main pulmonary artery using a Bentson wire. Pulmonary artery pressure was obtained. C2 catheter was advanced into the right pulmonary artery and exchanged for a Vert catheter. Short-tip superstiff Amplatz wire was placed. 8 French vascular sheath was exchanged for a 24 French sheath. Inari 24 French FlowTriever catheter was advanced over the wire into the distal right pulmonary artery. Aspiration thrombectomy was performed. Large amount of clot was removed. The aspiration catheter was pulled back to the pulmonary artery bifurcation and a pulmonary arteriogram was performed. Vert catheter was advanced into the left pulmonary arteries using a buddy wire technique. Attempted to advance a curved 20 French Triever into the left pulmonary artery but this was causing the 24 French  catheter to retract and buckle. The 20 French catheter was completely removed. The 24 French catheter was advanced into the main left pulmonary artery and aspiration thrombectomy was performed. Large  amount of clot was removed. Pulmonary arteriogram was performed in the left pulmonary artery. Additional aspiration thrombectomy was performed. Final pulmonary artery pressure was obtained. The aspiration catheter was completely removed. The right groin sheath was removed using a pursestring suture and external closure device. Right groin hemostasis at the end of the procedure. FINDINGS: Pre thrombectomy pulmonary artery pressure: 33/20, mean 26 mmHg Post thrombectomy pulmonary artery pressure: 28/19, mean 23 mmHg Pulmonary arteriogram demonstrated flow in the main left and right pulmonary arteries after thrombectomy. IMPRESSION: 1. Successful catheter directed mechanical thrombectomy of bilateral pulmonary emboli. 2. Placement of left jugular central venous catheter. Catheter tip at the superior cavoatrial junction Electronically Signed   By: Juliene Balder M.D.   On: 08/26/2024 08:10   IR US  Guide Vasc Access Right Result Date: 08/26/2024 INDICATION: 68 year old with metastatic breast cancer and shortness of breath. CT demonstrates bilateral central pulmonary embolism and right heart strain. EXAM: 1. Pulmonary arteriography with pressures 2. Pulmonary artery thrombectomy 3. Ultrasound guidance for vascular access x2 4. Placement of non tunneled central venous catheter. COMPARISON:  Chest CTA 08/25/2024 MEDICATIONS: Moderate sedation ANESTHESIA/SEDATION: Moderate (conscious) sedation was employed during this procedure. A total of Versed  4 mg and Fentanyl  100 mcg was administered intravenously by the radiology nurse. Total intra-service moderate Sedation Time: 125 minutes. The patient's level of consciousness and vital signs were monitored continuously by radiology nursing throughout the procedure under my direct  supervision. FLUOROSCOPY: Radiation Exposure Index (as provided by the fluoroscopic device): 249 mGy Kerma CONTRAST:  100 mL Omnipaque  300 COMPLICATIONS: None immediate. TECHNIQUE: Informed written consent was obtained from the patient after a thorough discussion of the procedural risks, benefits and alternatives. All questions were addressed. A timeout was performed prior to the initiation of the procedure. Ultrasound demonstrated a patent left internal jugular vein. Ultrasound image was saved for documentation. Left neck was prepped and draped in sterile fashion. Maximal barrier sterile technique was utilized including caps, mask, sterile gowns, sterile gloves, sterile drape, hand hygiene and skin antiseptic. Left neck was anesthetized with 1% lidocaine . Small incision was made. Using ultrasound guidance, 21 gauge needle was directed into the left internal jugular vein. Micropuncture catheter was placed. Bentson wire was advanced into the superior vena cava. Peel-away sheath was placed. Dual lumen power PICC line was cut to an appropriate length and advanced through the peel-away sheath. The tip was placed at the superior cavoatrial junction. Both lumens aspirated and flushed well. Both lumens were capped and clamped. Central line was sutured to skin and a bandage was placed. Right groin was prepped and draped in sterile fashion. Maximal barrier sterile technique was utilized including caps, mask, sterile gowns, sterile gloves, sterile drape, hand hygiene and skin antiseptic. Ultrasound confirmed a patent right common femoral vein. Ultrasound image was saved for documentation. Skin was anesthetized with 1% lidocaine . Small incision was made. Using ultrasound guidance, 21 gauge needle was directed into the right common femoral vein and micropuncture dilator set was placed. 8 French vascular sheath was placed over a Bentson wire. Curved pigtail catheter was advanced into the right heart. At this point, patient  developed supraclavicular tachycardia. The supraventricular tachycardia was not subsiding and the patient's blood pressure was dropping. Critical care performed electrical cardioversion emergently to get the patient back into normal sinus rhythm. Pigtail catheter was advanced back into the right atrium. It was difficult to advance a pigtail catheter into the right ventricle. Pigtail catheter was exchanged for a C2 catheter. C2 catheter was successfully advanced into  the right ventricle and main pulmonary artery using a Bentson wire. Pulmonary artery pressure was obtained. C2 catheter was advanced into the right pulmonary artery and exchanged for a Vert catheter. Short-tip superstiff Amplatz wire was placed. 8 French vascular sheath was exchanged for a 24 French sheath. Inari 24 French FlowTriever catheter was advanced over the wire into the distal right pulmonary artery. Aspiration thrombectomy was performed. Large amount of clot was removed. The aspiration catheter was pulled back to the pulmonary artery bifurcation and a pulmonary arteriogram was performed. Vert catheter was advanced into the left pulmonary arteries using a buddy wire technique. Attempted to advance a curved 20 French Triever into the left pulmonary artery but this was causing the 24 French catheter to retract and buckle. The 20 French catheter was completely removed. The 24 French catheter was advanced into the main left pulmonary artery and aspiration thrombectomy was performed. Large amount of clot was removed. Pulmonary arteriogram was performed in the left pulmonary artery. Additional aspiration thrombectomy was performed. Final pulmonary artery pressure was obtained. The aspiration catheter was completely removed. The right groin sheath was removed using a pursestring suture and external closure device. Right groin hemostasis at the end of the procedure. FINDINGS: Pre thrombectomy pulmonary artery pressure: 33/20, mean 26 mmHg Post  thrombectomy pulmonary artery pressure: 28/19, mean 23 mmHg Pulmonary arteriogram demonstrated flow in the main left and right pulmonary arteries after thrombectomy. IMPRESSION: 1. Successful catheter directed mechanical thrombectomy of bilateral pulmonary emboli. 2. Placement of left jugular central venous catheter. Catheter tip at the superior cavoatrial junction Electronically Signed   By: Juliene Balder M.D.   On: 08/26/2024 08:10   IR US  Guide Vasc Access Left Result Date: 08/26/2024 INDICATION: 68 year old with metastatic breast cancer and shortness of breath. CT demonstrates bilateral central pulmonary embolism and right heart strain. EXAM: 1. Pulmonary arteriography with pressures 2. Pulmonary artery thrombectomy 3. Ultrasound guidance for vascular access x2 4. Placement of non tunneled central venous catheter. COMPARISON:  Chest CTA 08/25/2024 MEDICATIONS: Moderate sedation ANESTHESIA/SEDATION: Moderate (conscious) sedation was employed during this procedure. A total of Versed  4 mg and Fentanyl  100 mcg was administered intravenously by the radiology nurse. Total intra-service moderate Sedation Time: 125 minutes. The patient's level of consciousness and vital signs were monitored continuously by radiology nursing throughout the procedure under my direct supervision. FLUOROSCOPY: Radiation Exposure Index (as provided by the fluoroscopic device): 249 mGy Kerma CONTRAST:  100 mL Omnipaque  300 COMPLICATIONS: None immediate. TECHNIQUE: Informed written consent was obtained from the patient after a thorough discussion of the procedural risks, benefits and alternatives. All questions were addressed. A timeout was performed prior to the initiation of the procedure. Ultrasound demonstrated a patent left internal jugular vein. Ultrasound image was saved for documentation. Left neck was prepped and draped in sterile fashion. Maximal barrier sterile technique was utilized including caps, mask, sterile gowns, sterile  gloves, sterile drape, hand hygiene and skin antiseptic. Left neck was anesthetized with 1% lidocaine . Small incision was made. Using ultrasound guidance, 21 gauge needle was directed into the left internal jugular vein. Micropuncture catheter was placed. Bentson wire was advanced into the superior vena cava. Peel-away sheath was placed. Dual lumen power PICC line was cut to an appropriate length and advanced through the peel-away sheath. The tip was placed at the superior cavoatrial junction. Both lumens aspirated and flushed well. Both lumens were capped and clamped. Central line was sutured to skin and a bandage was placed. Right groin was prepped and draped in  sterile fashion. Maximal barrier sterile technique was utilized including caps, mask, sterile gowns, sterile gloves, sterile drape, hand hygiene and skin antiseptic. Ultrasound confirmed a patent right common femoral vein. Ultrasound image was saved for documentation. Skin was anesthetized with 1% lidocaine . Small incision was made. Using ultrasound guidance, 21 gauge needle was directed into the right common femoral vein and micropuncture dilator set was placed. 8 French vascular sheath was placed over a Bentson wire. Curved pigtail catheter was advanced into the right heart. At this point, patient developed supraclavicular tachycardia. The supraventricular tachycardia was not subsiding and the patient's blood pressure was dropping. Critical care performed electrical cardioversion emergently to get the patient back into normal sinus rhythm. Pigtail catheter was advanced back into the right atrium. It was difficult to advance a pigtail catheter into the right ventricle. Pigtail catheter was exchanged for a C2 catheter. C2 catheter was successfully advanced into the right ventricle and main pulmonary artery using a Bentson wire. Pulmonary artery pressure was obtained. C2 catheter was advanced into the right pulmonary artery and exchanged for a Vert  catheter. Short-tip superstiff Amplatz wire was placed. 8 French vascular sheath was exchanged for a 24 French sheath. Inari 24 French FlowTriever catheter was advanced over the wire into the distal right pulmonary artery. Aspiration thrombectomy was performed. Large amount of clot was removed. The aspiration catheter was pulled back to the pulmonary artery bifurcation and a pulmonary arteriogram was performed. Vert catheter was advanced into the left pulmonary arteries using a buddy wire technique. Attempted to advance a curved 20 French Triever into the left pulmonary artery but this was causing the 24 French catheter to retract and buckle. The 20 French catheter was completely removed. The 24 French catheter was advanced into the main left pulmonary artery and aspiration thrombectomy was performed. Large amount of clot was removed. Pulmonary arteriogram was performed in the left pulmonary artery. Additional aspiration thrombectomy was performed. Final pulmonary artery pressure was obtained. The aspiration catheter was completely removed. The right groin sheath was removed using a pursestring suture and external closure device. Right groin hemostasis at the end of the procedure. FINDINGS: Pre thrombectomy pulmonary artery pressure: 33/20, mean 26 mmHg Post thrombectomy pulmonary artery pressure: 28/19, mean 23 mmHg Pulmonary arteriogram demonstrated flow in the main left and right pulmonary arteries after thrombectomy. IMPRESSION: 1. Successful catheter directed mechanical thrombectomy of bilateral pulmonary emboli. 2. Placement of left jugular central venous catheter. Catheter tip at the superior cavoatrial junction Electronically Signed   By: Juliene Balder M.D.   On: 08/26/2024 08:10   IR THROMBECT PRIM MECH INIT (INCLU) MOD SED Result Date: 08/26/2024 INDICATION: 68 year old with metastatic breast cancer and shortness of breath. CT demonstrates bilateral central pulmonary embolism and right heart strain. EXAM:  1. Pulmonary arteriography with pressures 2. Pulmonary artery thrombectomy 3. Ultrasound guidance for vascular access x2 4. Placement of non tunneled central venous catheter. COMPARISON:  Chest CTA 08/25/2024 MEDICATIONS: Moderate sedation ANESTHESIA/SEDATION: Moderate (conscious) sedation was employed during this procedure. A total of Versed  4 mg and Fentanyl  100 mcg was administered intravenously by the radiology nurse. Total intra-service moderate Sedation Time: 125 minutes. The patient's level of consciousness and vital signs were monitored continuously by radiology nursing throughout the procedure under my direct supervision. FLUOROSCOPY: Radiation Exposure Index (as provided by the fluoroscopic device): 249 mGy Kerma CONTRAST:  100 mL Omnipaque  300 COMPLICATIONS: None immediate. TECHNIQUE: Informed written consent was obtained from the patient after a thorough discussion of the procedural risks, benefits and  alternatives. All questions were addressed. A timeout was performed prior to the initiation of the procedure. Ultrasound demonstrated a patent left internal jugular vein. Ultrasound image was saved for documentation. Left neck was prepped and draped in sterile fashion. Maximal barrier sterile technique was utilized including caps, mask, sterile gowns, sterile gloves, sterile drape, hand hygiene and skin antiseptic. Left neck was anesthetized with 1% lidocaine . Small incision was made. Using ultrasound guidance, 21 gauge needle was directed into the left internal jugular vein. Micropuncture catheter was placed. Bentson wire was advanced into the superior vena cava. Peel-away sheath was placed. Dual lumen power PICC line was cut to an appropriate length and advanced through the peel-away sheath. The tip was placed at the superior cavoatrial junction. Both lumens aspirated and flushed well. Both lumens were capped and clamped. Central line was sutured to skin and a bandage was placed. Right groin was prepped  and draped in sterile fashion. Maximal barrier sterile technique was utilized including caps, mask, sterile gowns, sterile gloves, sterile drape, hand hygiene and skin antiseptic. Ultrasound confirmed a patent right common femoral vein. Ultrasound image was saved for documentation. Skin was anesthetized with 1% lidocaine . Small incision was made. Using ultrasound guidance, 21 gauge needle was directed into the right common femoral vein and micropuncture dilator set was placed. 8 French vascular sheath was placed over a Bentson wire. Curved pigtail catheter was advanced into the right heart. At this point, patient developed supraclavicular tachycardia. The supraventricular tachycardia was not subsiding and the patient's blood pressure was dropping. Critical care performed electrical cardioversion emergently to get the patient back into normal sinus rhythm. Pigtail catheter was advanced back into the right atrium. It was difficult to advance a pigtail catheter into the right ventricle. Pigtail catheter was exchanged for a C2 catheter. C2 catheter was successfully advanced into the right ventricle and main pulmonary artery using a Bentson wire. Pulmonary artery pressure was obtained. C2 catheter was advanced into the right pulmonary artery and exchanged for a Vert catheter. Short-tip superstiff Amplatz wire was placed. 8 French vascular sheath was exchanged for a 24 French sheath. Inari 24 French FlowTriever catheter was advanced over the wire into the distal right pulmonary artery. Aspiration thrombectomy was performed. Large amount of clot was removed. The aspiration catheter was pulled back to the pulmonary artery bifurcation and a pulmonary arteriogram was performed. Vert catheter was advanced into the left pulmonary arteries using a buddy wire technique. Attempted to advance a curved 20 French Triever into the left pulmonary artery but this was causing the 24 French catheter to retract and buckle. The 20 French  catheter was completely removed. The 24 French catheter was advanced into the main left pulmonary artery and aspiration thrombectomy was performed. Large amount of clot was removed. Pulmonary arteriogram was performed in the left pulmonary artery. Additional aspiration thrombectomy was performed. Final pulmonary artery pressure was obtained. The aspiration catheter was completely removed. The right groin sheath was removed using a pursestring suture and external closure device. Right groin hemostasis at the end of the procedure. FINDINGS: Pre thrombectomy pulmonary artery pressure: 33/20, mean 26 mmHg Post thrombectomy pulmonary artery pressure: 28/19, mean 23 mmHg Pulmonary arteriogram demonstrated flow in the main left and right pulmonary arteries after thrombectomy. IMPRESSION: 1. Successful catheter directed mechanical thrombectomy of bilateral pulmonary emboli. 2. Placement of left jugular central venous catheter. Catheter tip at the superior cavoatrial junction Electronically Signed   By: Juliene Balder M.D.   On: 08/26/2024 08:10   DG CHEST  PORT 1 VIEW Result Date: 08/26/2024 EXAM: 1 VIEW(S) XRAY OF THE CHEST 08/26/2024 04:10:00 AM COMPARISON: 08/25/2024 CLINICAL HISTORY: Pneumothorax FINDINGS: LINES, TUBES AND DEVICES: Left IJ central venous catheter in place with distal tip at proximal right atrium. LUNGS AND PLEURA: A trace right apical pneumothorax is present and has decreased from the previous exam. Persistent bilateral pleural effusions, stable to increased in the interval. New mild to moderate interstitial edema with worsening aeration to both lower lung zones. HEART AND MEDIASTINUM: No acute abnormality of the cardiac and mediastinal silhouettes. BONES AND SOFT TISSUES: Thoracic dextrocurvature. No acute osseous abnormality. IMPRESSION: 1. Trace right apical pneumothorax, decreased from the previous exam. 2. New mild to moderate interstitial edema with worsening aeration to both lower lung zones. 3.  Bilateral pleural effusions , stable to increased in the interval. Electronically signed by: Waddell Calk MD 08/26/2024 04:38 AM EST RP Workstation: [id number]   CT Angio Chest PE W and/or Wo Contrast Result Date: 08/25/2024 EXAM: CTA of the Chest with contrast for PE 08/25/2024 01:07:37 PM TECHNIQUE: CTA of the chest was performed after the administration of intravenous contrast. Multiplanar reformatted images are provided for review. MIP images are provided for review. Automated exposure control, iterative reconstruction, and/or weight based adjustment of the mA/kV was utilized to reduce the radiation dose to as low as reasonably achievable. COMPARISON: Chest radiograph 08/25/2024. CT chest 07/30/2024. CLINICAL HISTORY: Pulmonary embolism (PE) suspected, high prob. FINDINGS: PULMONARY ARTERIES: Pulmonary arteries are adequately opacified for evaluation. Positive examination for multiple bilateral central and segmental pulmonary emboli including right and left main pulmonary arteries and bilateral upper and lower lobe branches. Moderate to high clot burden. Enlarged main pulmonary artery may indicate pulmonary arterial hypertension. MEDIASTINUM: RA to RV ratio measures 2.6 suggesting likely right heart strain. There is no acute abnormality of the thoracic aorta. Calcification of the aorta. LYMPH NODES: Mediastinal and bilateral hilar lymphadenopathy. No axillary lymphadenopathy. LUNGS AND PLEURA: Patchy areas of infiltration or consolidation in the lungs. Moderate bilateral pleural effusions with basilar atelectasis or consolidation bilaterally. Small right pneumothorax as seen on prior chest radiographs pse . UPPER ABDOMEN: Limited images of the upper abdomen are unremarkable. SOFT TISSUES AND BONES: Soft tissue mass in the right chest wall measuring at least 4 x 12.1 cm in diameter, incompletely included within the field of view. This appears similar to the prior study. Previous right mastectomy. No acute  bone abnormality. Thoracolumbar scoliosis convex towards the left  . NOTE CRITICAL RESULT: Critical results/urgent findings were called at 1:18 pm on 08/25/2024 by radiologist W. Okey Gravely, MD to Lavanda Lesches. IMPRESSION: 1. Multiple bilateral central and segmental pulmonary emboli with moderate to high clot burden and CT evidence of right heart strain (RA:RV ratio 2.6). 2. Small right pneumothorax, seen on prior chest radiographs. 3. Moderate bilateral pleural effusions with basilar atelectasis or consolidation bilaterally. 4. Mediastinal and bilateral hilar lymphadenopathy. 5. Soft tissue mass in the right chest wall, incompletely included within the field of view, similar to prior study. 6. Note critical result. Critical results/urgent findings were called at 1:18 pm on 08/25/2024 by radiologist w. okey gravely, md to [place]. Electronically signed by: Elsie Gravely MD 08/25/2024 01:26 PM EST RP Workstation: [id number]   DG Chest Portable 1 View Result Date: 08/25/2024 CLINICAL DATA:  Shortness of breath. EXAM: PORTABLE CHEST 1 VIEW COMPARISON:  Chest radiograph dated 08/24/2024. FINDINGS: Small left pleural effusion and left lung base atelectasis or infiltrate. Trace right pleural effusion noted. Small right apical pneumothorax minimally  increased since the prior radiograph. Stable cardiac silhouette. Degenerative changes of the spine and scoliosis. No acute osseous pathology. IMPRESSION: 1. Small right apical pneumothorax minimally increased since the prior radiograph. 2. Small left pleural effusion and left lung base atelectasis or infiltrate. Electronically Signed   By: Vanetta Chou M.D.   On: 08/25/2024 11:54   DG Chest 1 View Result Date: 08/24/2024 CLINICAL DATA:  Status post right thoracentesis with suggestion of small apical pneumothorax on immediate postprocedural chest x-ray. EXAM: DG CHEST 1V COMPARISON:  Prior chest x-ray at 1232 hours FINDINGS: Stable suggestion of a less  than 5% right apical pneumothorax. Improved aeration at the right lung base. Trace residual right pleural effusion and small to moderate left pleural effusion. Left basilar atelectasis present. IMPRESSION: Stable less than 5% right apical pneumothorax. Improved aeration at the right lung base. Trace residual right pleural effusion and small to moderate left pleural effusion. Electronically Signed   By: Marcey Moan M.D.   On: 08/24/2024 13:55   DG Chest Port 1 View Result Date: 08/24/2024 CLINICAL DATA:  Status post right-sided thoracentesis. EXAM: PORTABLE CHEST 1 VIEW COMPARISON:  One view chest 08/22/2024 FINDINGS: Interval decrease right-sided pleural effusion with a small residual effusion still present. Tiny apical pneumothorax. Left-sided basilar atelectasis and pleural effusion still present. IMPRESSION: Tiny apical pneumothorax. Electronically Signed   By: Cordella Banner   On: 08/24/2024 13:14   US  THORACENTESIS ASP PLEURAL SPACE W/IMG GUIDE Result Date: 08/24/2024 INDICATION: 68 year old female with history of recurrent pleural effusion. Request for therapeutic right thoracentesis. EXAM: ULTRASOUND GUIDED RIGHT THORACENTESIS MEDICATIONS: 10 mL 1% lidocaine . COMPLICATIONS: None immediate. PROCEDURE: An ultrasound guided thoracentesis was thoroughly discussed with the patient and questions answered. The benefits, risks, alternatives and complications were also discussed. The patient understands and wishes to proceed with the procedure. Written consent was obtained. Ultrasound was performed to localize and mark an adequate pocket of fluid in the right chest. The area was then prepped and draped in the normal sterile fashion. 1% Lidocaine  was used for local anesthesia. Under ultrasound guidance a 6 Fr Safe-T-Centesis catheter was introduced. Thoracentesis was performed. The catheter was removed and a dressing applied. FINDINGS: A total of approximately 400 mL of cloudy, yellow fluid was removed.  Samples were sent to the laboratory as requested by the clinical team. IMPRESSION: Successful ultrasound guided right thoracentesis yielding 400 mL of pleural fluid. Performed by: Solmon Ku PA-C Electronically Signed   By: Cordella Banner   On: 08/24/2024 12:52    Labs:  CBC: Recent Labs    08/25/24 1515 08/25/24 1534 08/26/24 0435 08/27/24 0310 08/28/24 0440  WBC 5.4  --  4.8 5.1 5.4  HGB 11.7* 11.9* 10.1* 9.6* 9.1*  HCT 35.3* 35.0* 30.9* 30.0* 28.8*  PLT 507*  --  507* 435* 445*    COAGS: Recent Labs    08/25/24 1515  INR 1.2    BMP: Recent Labs    08/25/24 1210 08/25/24 1534 08/25/24 2021 08/26/24 0435 08/27/24 0310  NA 137 136 138 138 136  K 4.7 5.8* 3.8 4.0 3.6  CL 104 111 108 109 108  CO2 19*  --  19* 20* 19*  GLUCOSE 109* 95 99 115* 94  BUN 30* 38* 25* 26* 21  CALCIUM 9.2  --  7.9* 8.0* 8.0*  CREATININE 1.00 0.70 0.68 0.81 0.69  GFRNONAA >60  --  >60 >60 >60    LIVER FUNCTION TESTS: Recent Labs    05/31/24 0934 08/09/24  1016 08/20/24 0825 08/25/24 2021  BILITOT 0.4 0.4 0.3 0.4  AST 24 20 22 23   ALT 19 9 13 10   ALKPHOS 53 64 65 71  PROT 6.8 6.7 6.6 5.7*  ALBUMIN 4.2 3.3* 3.6 3.1*    Assessment and Plan: 68 yo female with PMH sig for asthma, nephrolithiasis, hypothyroidism, scoliosis, metastatic breast cancer with 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 Hughes.Risks and benefits discussed with the patient/spouse including bleeding, infection, damage to adjacent structures/pneumothorax.   All of the patient's questions were answered, patient is agreeable to proceed. Consent signed and in chart.  IV heparin  has been held; procedure scheduled for later today   Electronically Signed: D. Franky Rakers,  PA-C 08/28/2024, 10:48 AM   I spent a total of 25 Minutes at the the patient's bedside AND on the patient's hospital floor or unit, greater than 50% of which was counseling/coordinating care for image guided left pleurx catheter placement    Patient ID: Caitlin [name], female   DOB: 1956-04-04, 68 y.o.   MRN: [id number]

## 2024-08-28 NOTE — Plan of Care (Signed)
  Problem: Education: Goal: Knowledge of General Education information will improve Description: Including pain rating scale, medication(s)/side effects and non-pharmacologic comfort measures Outcome: Progressing   Problem: Health Behavior/Discharge Planning: Goal: Ability to manage health-related needs will improve Outcome: Progressing   Problem: Clinical Measurements: Goal: Will remain free from infection Outcome: Progressing   Problem: Clinical Measurements: Goal: Respiratory complications will improve Outcome: Progressing   

## 2024-08-28 NOTE — TOC Initial Note (Signed)
 Transition of Care [facility]) - Initial/Assessment Note    Patient Details  Name: Caitlin [name] MRN: [id number] Date of Birth: October 21, 1955  [facility] CM/SW Contact:    Jon ONEIDA Anon, RN Phone Number: 08/28/2024, 2:05 PM  Clinical Narrative:                 Pt is from home. IP CM consulted for HH/ supplies for pleurx cath. Pt is to have pleurx cath placed today. Will get orders signed once pt has pleurx cath placed. Orders for [facility] RN sent to [facility], and have been accepted. [facility] HH will provide RN at discharge. IP CM continuing to follow for DC planning needs.     Expected Discharge Plan: Home w Home Health Services Barriers to Discharge: Continued Medical Work up   Patient Goals and CMS Choice Patient states their goals for this hospitalization and ongoing recovery are:: Return home with [facility] services CMS Medicare.gov Compare Post Acute Care list provided to:: Patient Choice offered to / list presented to : Patient [place] ownership interest in [facility].provided to:: Patient    Expected Discharge Plan and Services In-house Referral: NA Discharge Planning Services: CM Consult Post Acute Care Choice: Home Health Living arrangements for the past 2 months: Single Family Home                 DME Arranged: N/A DME Agency: NA       HH Arranged: RN HH AgencyHotel Manager Home Health Care Date [facility] Agency Contacted: 08/28/24 Time HH Agency Contacted: 1403 Representative spoke with at [facility] Agency: Accepted through the HUB  Prior Living Arrangements/Services Living arrangements for the past 2 months: Single Family Home Lives with:: Spouse Patient language and need for interpreter reviewed:: Yes Do you feel safe going back to the place where you live?: Yes      Need for Family Participation in Patient Care: Yes (Comment) Care giver support system in place?: Yes (comment)   Criminal Activity/Legal Involvement Pertinent to Current  Situation/Hospitalization: No - Comment as needed  Activities of Daily Living   ADL Screening (condition at time of admission) Independently performs ADLs?: Yes (appropriate for developmental age) Is the patient deaf or have difficulty hearing?: No Does the patient have difficulty seeing, even when wearing glasses/contacts?: No Does the patient have difficulty concentrating, remembering, or making decisions?: No  Permission Sought/Granted Permission sought to share information with : Family Supports    Share Information with NAME: Caitlin [name], Caitlin [name] (Spouse)  [phone]           Emotional Assessment Appearance:: Appears stated age     Orientation: : Oriented to Self, Oriented to Place, Oriented to  Time, Oriented to Situation Alcohol / Substance Use: Not Applicable Psych Involvement: No (comment)  Admission diagnosis:  Pulmonary embolism (HCC) [I26.99] Acute respiratory failure with hypoxia (HCC) [J96.01] Other acute pulmonary embolism with acute cor pulmonale (HCC) [I26.09] Patient Active Problem List   Diagnosis Date Noted   Pulmonary embolism (HCC) 08/25/2024   S/P mastectomy, right 05/03/2023   Port-A-Cath in place 12/16/2022   Genetic testing 10/11/2022   Appendicitis with peritonitis 09/29/2022   Family history of prostate cancer 09/29/2022   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female, estrogen receptor positive (HCC) 09/22/2022   Atrial fibrillation with RVR (HCC) 11/30/2010   Chronic anticoagulation 11/30/2010   Hypothyroidism 11/30/2010   PALPITATIONS 11/27/2010   PCP:  Toribio Jerel MATSU, MD Pharmacy:   [facility]  [address], [place] - [address] [place] [address] Phone: [phone] Fax: [phone]  [facility] - [facility] [address] [place] [address] Phone: [phone] Fax: [phone]     Social Drivers of Health (SDOH) Social History: SDOH Screenings   Food  Insecurity: No Food Insecurity (08/26/2024)  Housing: Low Risk  (08/26/2024)  Transportation Needs: No Transportation Needs (08/26/2024)  Utilities: Not At Risk (08/26/2024)  Alcohol Screen: Low Risk  (05/27/2023)  Depression (PHQ2-9): Low Risk  (05/27/2023)  Physical Activity: Sufficiently Active (06/19/2020)   Received from [facility]  Social Connections: Moderately Integrated (08/26/2024)  Tobacco Use: Unknown (08/26/2024)  Health Literacy: Low Risk (11/12/2022)   Received from [facility]   SDOH Interventions:     Readmission Risk Interventions    08/28/2024    1:48 PM  Readmission Risk Prevention Plan  Transportation Screening Complete  PCP or Specialist Appt within 5-7 Days Complete  Home Care Screening Complete  Medication Review (RN CM) Complete

## 2024-08-28 NOTE — Progress Notes (Addendum)
 [facility] Progress Note  Patient: Caitlin [name]     FMW:[id number]  DOA: 08/25/2024   PCP: Toribio Jerel MATSU, MD       Brief hospital course: This is a 68 year old with metastatic breast cancer, paroxysmal atrial fibrillation, moderate persistent asthma, hypothyroidism who was admitted for subacute shortness of breath and found to have a submassive PE.  She has a history of recurrent pleural effusions and underwent a left thoracentesis on 10/29 and 11/19.  On 11/19, despite having bilateral thoracentesis, she continued to have shortness of breath and was noted by her husband to have a pulse ox of 86% and therefore brought to the ED.  It appears that she has had a recent road trip which could have contributed. In the ED CT of the chest was ordered showing a small right apical pneumothorax, bilateral central pulmonary emboli, dilated RV suggestive of RV strain, tachycardia and hypoxia.  11/22: Underwent thrombectomy - While in IR, she was found to have SVT with hemodynamic instability and DC cardioversion was done.  Noted to have continued hypotension afterwards which did not improve with normal saline and therefore started on norepinephrine . By the following day pressors were discontinued, discussions about pleural cath were had and it was decided to proceed with a pleural catheter.  The patient was transferred to [facility] service today.  Subjective:  Complains of shortness of breath with mild exertion.  Assessment and Plan: Principal Problem:   Pulmonary embolism (HCC) -Status post thrombectomy on 11/22 - Currently maintained on heparin  infusion while pursuing pleural catheter placement  Bilateral pleural effusions Mild apical pneumothorax on the right side - IR following with plans to place a left pleural drainage catheter - Pneumothorax on imaging is mild and no chest tube was placed - Outpatient follow-up with pulmonary planned for 12/19  SVT-history of PAF - Occurring  during thrombectomy - Requiring cardioversion - Of note, Cardizem  is listed as a home medication which she is currently not receiving-have reordered this - It appears she was on a baby aspirin at home which I will hold for now-she will eventually be on DOAC  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 - Reviewed last note from oncologist on 11/17 which stated that she was having nausea and fatigue felt to be related to chemotherapy - Next chemotherapy scheduled for 09/25/2024    Code Status: Full Code Total time on patient care: 35 minutes DVT prophylaxis: Heparin  infusion  Objective:   Vitals:   08/28/24 0545 08/28/24 0600 08/28/24 0615 08/28/24 0630  BP: 101/65 95/67 117/63 98/65  Pulse: 88 88 93 85  Resp: (!) 21 (!) 22 (!) 21 (!) 21  Temp:      TempSrc:      SpO2: 97% 98% 99% 99%  Weight:      Height:       Filed Weights   08/25/24 1900 08/26/24 0500 08/28/24 0500  Weight: 59.7 kg 60.9 kg 61 kg   Exam: General exam: Appears comfortable  HEENT: oral mucosa moist Respiratory system: Clear to auscultation.  No breath sounds noted in left mid and lower lung field and right lower lung field, Cardiovascular system: S1 & S2 heard  Gastrointestinal system: Abdomen soft, non-tender, nondistended. Normal bowel sounds   Extremities: No cyanosis, clubbing or edema Psychiatry:  Mood & affect appropriate.      CBC: Recent Labs  Lab 08/25/24 1210 08/25/24 1515 08/25/24 1534 08/26/24 0435 08/27/24 0310 08/28/24 0440  WBC 5.6 5.4  --  4.8  5.1 5.4  HGB 13.1 11.7* 11.9* 10.1* 9.6* 9.1*  HCT 39.6 35.3* 35.0* 30.9* 30.0* 28.8*  MCV 97.3 97.2  --  98.7 100.3* 101.1*  PLT 536* 507*  --  507* 435* 445*   Basic Metabolic Panel: Recent Labs  Lab 08/25/24 1210 08/25/24 1515 08/25/24 1534 08/25/24 2021 08/26/24 0435 08/27/24 0310  NA 137  --  136 138 138 136  K 4.7  --  5.8* 3.8 4.0 3.6  CL 104  --  111 108 109 108  CO2 19*  --   --  19* 20* 19*   GLUCOSE 109*  --  95 99 115* 94  BUN 30*  --  38* 25* 26* 21  CREATININE 1.00  --  0.70 0.68 0.81 0.69  CALCIUM 9.2  --   --  7.9* 8.0* 8.0*  MG  --  2.4  --   --   --   --   PHOS  --  3.1  --   --   --   --      Scheduled Meds:  budesonide   0.5 mg Nebulization BID   Chlorhexidine  Gluconate Cloth  6 each Topical Daily   feeding supplement  237 mL Oral BID BM   levothyroxine   75 mcg Oral Q0600   pantoprazole   40 mg Oral Daily    Imaging and lab data personally reviewed   Author: Shiven Junious  08/28/2024 8:01 AM  To contact [facility]>   Check the care team in [facility] and look for the attending/consulting [facility] provider listed  Log into [email or web address] and use [facility] universal password   Go to> [facility]  and find provider  If you still have difficulty reaching the provider, please page the [facility] (Director on Call) for the Hospitalists listed on amion

## 2024-08-28 NOTE — Evaluation (Signed)
 Occupational Therapy Evaluation Patient Details Name: Caitlin [name] MRN: [id number] DOB: 1956/01/08 Today's Date: 08/28/2024   History of Present Illness   68 yo female presents to therapy following hospital admission on 08/25/2024 due to SOB pt hypoxic and tachycardic at PCP office and directed to ED; pt found to have PE. CTA of chest revealed R apical pneumothorax, B PE and RV dilation indicative of RV strain. Pt underwent thrombectomy on 11/22. Pt has had thoracentesis on 10/29 and 11/19. Pt PMH includes but is not limited to: asthma, R Ba Ca, HA, hypothyroidism, and scoliosis.     Clinical Impressions PTA, patient lives at home with husband and was independent including driving.  Currently, patient presents with deficits outlined below (see OT Problem List for details) most significantly pain, generalized muscle weakness, decreased activity tolerance and balance impacting BADL's (CGA) and functional mobility (CGA) performance. Recommending HHOT services upon discharge from hospital with family assist/support. Patient requires continued Acute care hospital level OT services to progress safety and functional performance and allow for discharge.       If plan is discharge home, recommend the following:   A little help with walking and/or transfers;A little help with bathing/dressing/bathroom;Assistance with cooking/housework;Assist for transportation;Help with stairs or ramp for entrance     Functional Status Assessment   Patient has had a recent decline in their functional status and demonstrates the ability to make significant improvements in function in a reasonable and predictable amount of time.     Equipment Recommendations   None recommended by OT      Precautions/Restrictions   Precautions Precautions: Fall Restrictions Weight Bearing Restrictions Per Provider Order: No     Mobility Bed Mobility               General bed mobility comments: pt seated  in recliner when PT arrived and returned to recliner at end of eval    Transfers Overall transfer level: Needs assistance Equipment used: Rolling walker (2 wheels) Transfers: Sit to/from Stand Sit to Stand: Contact guard assist           General transfer comment: min cues pt able to push to stand from recliner, noted to have R UE edema and OPPT to address      Balance Overall balance assessment: Mild deficits observed, not formally tested                                         ADL either performed or assessed with clinical judgement   ADL Overall ADL's : Needs assistance/impaired Eating/Feeding: Modified independent   Grooming: Wash/dry hands;Wash/dry face;Oral care;Modified independent;Sitting   Upper Body Bathing: Set up;Sitting   Lower Body Bathing: Sit to/from stand;Supervison/ safety   Upper Body Dressing : Set up;Sitting   Lower Body Dressing: Supervision/safety;Sit to/from stand   Toilet Transfer: Stand-pivot;Contact guard assist   Toileting- Clothing Manipulation and Hygiene: Supervision/safety;Sit to/from stand       Functional mobility during ADLs: Supervision/safety;Contact guard assist General ADL Comments: cues for pacing     Vision Baseline Vision/History: 0 No visual deficits;1 Wears glasses              Pertinent Vitals/Pain Pain Assessment Pain Assessment: 0-10 Pain Score: 3  Pain Location: R shoulder Pain Descriptors / Indicators: Aching, Discomfort, Guarding Pain Intervention(s): Limited activity within patient's tolerance, Monitored during session, Repositioned     Extremity/Trunk Assessment Upper Extremity  Assessment Upper Extremity Assessment: Generalized weakness;Right hand dominant   Lower Extremity Assessment Lower Extremity Assessment: Defer to PT evaluation   Cervical / Trunk Assessment Cervical / Trunk Assessment: Other exceptions;Lordotic (scoliosis)   Communication Communication Communication: No  apparent difficulties   Cognition Arousal: Alert Behavior During Therapy: WFL for tasks assessed/performed Cognition: No apparent impairments                               Following commands: Intact       Cueing  General Comments   Cueing Techniques: Verbal cues  baseline R UE edem d/t breast Ca, SpO2 monitored and sats remained 94-98% on RA, reaplied O2 via [place] 1 ltr as per patinet request with upcoming thoracentesis           Home Living Family/patient expects to be discharged to:: Private residence Living Arrangements: Spouse/significant other Available Help at Discharge: Family Type of Home: House Home Access: Stairs to enter Secretary/administrator of Steps: 1 Entrance Stairs-Rails: None Home Layout: One level     Bathroom Shower/Tub: Producer, Television/film/video: Standard Bathroom Accessibility: No   Home Equipment: Information systems manager - built in (hand held shower head)   Additional Comments: participation with OPPT services for R UE      Prior Functioning/Environment Prior Level of Function : Independent/Modified Independent             Mobility Comments: IND no AD for all ADLs, sefl care tasks and IADLs, working on a farm      OT Problem List: Decreased strength;Decreased activity tolerance;Impaired balance (sitting and/or standing);Cardiopulmonary status limiting activity;Pain;Increased edema   OT Treatment/Interventions: Self-care/ADL training;Therapeutic exercise;Neuromuscular education;Energy conservation;DME and/or AE instruction;Therapeutic activities;Patient/family education;Balance training      OT Goals(Current goals can be found in the care plan section)   Acute Rehab OT Goals Patient Stated Goal: to feel better OT Goal Formulation: With patient/family Time For Goal Achievement: 09/11/24 Potential to Achieve Goals: Fair ADL Goals Pt Will Transfer to Toilet: with modified independence Pt Will Perform Tub/Shower Transfer:  Shower transfer;with supervision;ambulating;shower seat Pt/caregiver will Perform Home Exercise Program: Increased strength;Both right and left upper extremity;With theraband;With written HEP provided Additional ADL Goal #1: Patient will teach back 5/5 5 P's of ECT indep   OT Frequency:  Min 2X/week    Co-evaluation   Reason for Co-Treatment: Complexity of the patient's impairments (multi-system involvement);Necessary to address cognition/behavior during functional activity;For patient/therapist safety;To address functional/ADL transfers PT goals addressed during session: Mobility/safety with mobility;Balance;Proper use of DME OT goals addressed during session: ADL's and self-care;Proper use of Adaptive equipment and DME      AM-PAC OT 6 Clicks Daily Activity     Outcome Measure Help from another person eating meals?: None Help from another person taking care of personal grooming?: None Help from another person toileting, which includes using toliet, bedpan, or urinal?: A Little Help from another person bathing (including washing, rinsing, drying)?: A Little Help from another person to put on and taking off regular upper body clothing?: A Little Help from another person to put on and taking off regular lower body clothing?: A Little 6 Click Score: 20   End of Session Equipment Utilized During Treatment: Gait belt;Rolling walker (2 wheels);Oxygen Nurse Communication: Mobility status  Activity Tolerance: Patient limited by fatigue Patient left: in chair;with call bell/phone within reach;with chair alarm set;with family/visitor present  OT Visit Diagnosis: Unsteadiness on feet (R26.81);Muscle weakness (generalized) (  M62.81);Pain Pain - Right/Left: Right Pain - part of body: Arm                Time: 8798-8774 OT Time Calculation (min): 24 min Charges:  OT General Charges $OT Visit: 1 Visit OT Evaluation $OT Eval Low Complexity: 1 Low  Julien Berryman OT/L Acute Rehabilitation Department   [phone]  08/28/2024, 6:14 PM

## 2024-08-28 NOTE — Discharge Instructions (Signed)
 Information on my medicine - ELIQUIS  (apixaban )  Why was Eliquis  prescribed for you? Eliquis  was prescribed to treat blood clots that may have been found in the veins of your legs (deep vein thrombosis) or in your lungs (pulmonary embolism) and to reduce the risk of them occurring again.  What do You need to know about Eliquis  ? The starting dose is 10 mg (two 5 mg tablets) taken TWICE daily for the FIRST SEVEN (7) DAYS, then on (enter date)  09/05/24  the dose is reduced to ONE 5 mg tablet taken TWICE daily.  Eliquis  may be taken with or without food.   Try to take the dose about the same time in the morning and in the evening. If you have difficulty swallowing the tablet whole please discuss with your pharmacist how to take the medication safely.  Take Eliquis  exactly as prescribed and DO NOT stop taking Eliquis  without talking to the doctor who prescribed the medication.  Stopping may increase your risk of developing a new blood clot.  Refill your prescription before you run out.  After discharge, you should have regular check-up appointments with your healthcare provider that is prescribing your Eliquis .    What do you do if you miss a dose? If a dose of ELIQUIS  is not taken at the scheduled time, take it as soon as possible on the same day and twice-daily administration should be resumed. The dose should not be doubled to make up for a missed dose.  Important Safety Information A possible side effect of Eliquis  is bleeding. You should call your healthcare provider right away if you experience any of the following: Bleeding from an injury or your nose that does not stop. Unusual colored urine (red or dark brown) or unusual colored stools (red or black). Unusual bruising for unknown reasons. A serious fall or if you hit your head (even if there is no bleeding).  Some medicines may interact with Eliquis  and might increase your risk of bleeding or clotting while on Eliquis . To  help avoid this, consult your healthcare provider or pharmacist prior to using any new prescription or non-prescription medications, including herbals, vitamins, non-steroidal anti-inflammatory drugs (NSAIDs) and supplements.  This website has more information on Eliquis  (apixaban ): http://[email or web address] .com/eliquis dena

## 2024-08-28 NOTE — Procedures (Signed)
 Vascular and Interventional Radiology Procedure Note  Patient: Caitlin [name] DOB: Jul 26, 1956 Medical Record Number: [id number] Note Date/Time: 08/28/24 5:28 PM   Performing Physician: Thom Hall, MD Assistant(s): None  Diagnosis: Recurrent pleural effusion. Hx breast CA  Procedure:  TUNNELED LEFT PLEURAL (PleurRx ) DRAINAGE CATHETER  PLACEMENT THERAPEUTIC THORACENTESIS  Anesthesia: Conscious Sedation Complications: None Estimated Blood Loss: Minimal Specimens:  Sent Cytology  Findings:  Tunneled L pleural drainage catheter placement, with catheter tip positioned at the L apex. Therapeutic thoracentesis with 1200 mL of fluid was obtained.   Plan: Intermittent drainage schedule per Pulmonary / Oncology Teams   See detailed procedure note with images in PACS. The patient tolerated the procedure well without incident or complication and was returned to Recovery in stable condition.    Thom Hall, MD [facility]   Pager. [phone] Clinic. [phone]

## 2024-08-28 NOTE — Progress Notes (Signed)
 PHARMACY - ANTICOAGULATION CONSULT NOTE  Pharmacy Consult for heparin  Indication: pulmonary embolus  Allergies  Allergen Reactions   Penicillins Rash    childhood reaction  Did it involve swelling of the face/tongue/throat, SOB, or low BP? No Did it involve sudden or severe rash/hives, skin peeling, or any reaction on the inside of your mouth or nose? Yes Did you need to seek medical attention at a hospital or doctor's office? No When did it last happen?      childhood If all above answers are NO, may proceed with cephalosporin use.      Patient Measurements: Height: 5' 3 (160 cm) Weight: 61 kg (134 lb 7.7 oz) IBW/kg (Calculated) : 52.4 HEPARIN  DW (KG): 59.7  Vital Signs: Temp: 98 F (36.7 C) (11/25 1500) Temp Source: Oral (11/25 1500) BP: 109/62 (11/25 1730) Pulse Rate: 90 (11/25 1733)  Labs: Recent Labs    08/25/24 2021 08/25/24 2021 08/26/24 0435 08/26/24 1200 08/26/24 1930 08/27/24 0310 08/28/24 0440  HGB  --    < > 10.1*  --   --  9.6* 9.1*  HCT  --   --  30.9*  --   --  30.0* 28.8*  PLT  --   --  507*  --   --  435* 445*  HEPARINUNFRC >1.10*  --  0.22*   < > 0.35 0.43 0.47  CREATININE 0.68  --  0.81  --   --  0.69  --    < > = values in this interval not displayed.    Estimated Creatinine Clearance: 56.4 mL/min (by C-G formula based on SCr of 0.69 mg/dL).   Medical History: Past Medical History:  Diagnosis Date   Asthma    Breast cancer (HCC)    right   Complication of anesthesia    Dyspnea    Dysrhythmia    2012   Headache    History of kidney stones    passed   Hypothyroidism    Pneumonia 01/26/2023   PONV (postoperative nausea and vomiting)    rt breast ca 08/2022   Right Breast   Scoliosis    Thyroid  disease     Medications: No anticoagulants PTA  Assessment: Pt is a 68yoF with PMH significant for metastatic breast cancer, recurrent pleural effusion who presented with SOB. CTA revealed multiple bilateral segmental PE with right  heart strain. Pharmacy consulted to dose heparin  for PE. Pt underwent bilateral thrombectomy by IR on 08/25/24 and PleurX placement on 08/28/24.    Today, 08/28/24 Heparin  was held this morning prior to PleurX catheter placement by IR Pharmacy now consulted to transition from IV heparin  to apixaban  for chronic PE management  CBC: Hgb low/decreased to 9.1, Plt remains elevated.  No bleeding or complications noted  Plan: Start apixaban  10mg  BID x 7days, followed by 5mg  BID  Will provide education and conduct copay check prior to discharge.  Pharmacy will now sign off.   Thank you for allowing pharmacy to be a part of this patient's care.  Marget Hench, PharmD Clinical Pharmacist 08/28/2024 5:49 PM

## 2024-08-28 NOTE — Evaluation (Signed)
 Physical Therapy Evaluation Patient Details Name: Caitlin [name] MRN: [id number] DOB: December 09, 1955 Today's Date: 08/28/2024  History of Present Illness  68 yo female presents to therapy following hospital admission on 08/25/2024 due to SOB pt hypoxic and tachycardic at PCP office and directed to ED; pt found to have PE. CTA of chest revealed R apical pneumothorax, B PE and RV dilation indicative of RV strain. Pt underwent thrombectomy on 11/22. Pt has had thoracentesis on 10/29 and 11/19. Pt PMH includes but is not limited to: asthma, R Ba Ca, HA, hypothyroidism, and scoliosis.  Clinical Impression    Pt admitted with above diagnosis.  Pt currently with functional limitations due to the deficits listed below (see PT Problem List). Pt seated in recliner when therapist arrived. Spouse initially present and left during evaluation. Pt agreeable to evaluation however scheduled for thoracocentesis and indicated she did not want to exert herself. Pt on 1 L/min supplemental O2 and doffed for gait and transfer tasks with pt maintaining O2 saturation >/=94% on RA with pt reporting mild SOB. Pt required CGA for sit to stand  from recliner, gait tasks 12 feet in personal room with RW and min cues, pt able to perform SPT BSC to recliner with CGA and min cues, no overt LOB and no reported fall hx. Pt left seated in recliner, elected to donn supplemental O2 and all needs in place with R UE elevated. Pt will benefit from acute skilled PT to increase their independence and safety with mobility to allow discharge.         If plan is discharge home, recommend the following: A little help with walking and/or transfers;A little help with bathing/dressing/bathroom;Assistance with cooking/housework;Assist for transportation;Help with stairs or ramp for entrance   Can travel by private vehicle        Equipment Recommendations Other (comment) (TBD as pt progresses)  Recommendations for Other Services       Functional  Status Assessment Patient has had a recent decline in their functional status and demonstrates the ability to make significant improvements in function in a reasonable and predictable amount of time.     Precautions / Restrictions Precautions Precautions: Fall Restrictions Weight Bearing Restrictions Per Provider Order: No      Mobility  Bed Mobility               General bed mobility comments: pt seated in recliner when PT arrived and returned to recliner at end of eval    Transfers Overall transfer level: Needs assistance Equipment used: Rolling walker (2 wheels) Transfers: Sit to/from Stand Sit to Stand: Contact guard assist           General transfer comment: min cues pt able to push to stand from recliner, noted to have R UE edema and OPPT to address    Ambulation/Gait Ambulation/Gait assistance: Contact guard assist Gait Distance (Feet): 12 Feet Assistive device: Rolling walker (2 wheels) Gait Pattern/deviations: Step-to pattern, Trunk flexed Gait velocity: decreased     General Gait Details: slight trunk flexion with B UE support at RW min cues for RW mangement  Stairs            Wheelchair Mobility     Tilt Bed    Modified Rankin (Stroke Patients Only)       Balance Overall balance assessment: Mild deficits observed, not formally tested  Pertinent Vitals/Pain Pain Assessment Pain Assessment: 0-10 Pain Score: 3  Pain Location: R shoulder Pain Descriptors / Indicators: Aching, Discomfort, Guarding Pain Intervention(s): Limited activity within patient's tolerance, Monitored during session, Premedicated before session, Repositioned    Home Living Family/patient expects to be discharged to:: Private residence Living Arrangements: Spouse/significant other Available Help at Discharge: Family Type of Home: House Home Access: Stairs to enter Entrance Stairs-Rails: None Water Quality Scientist of Steps: 1   Home Layout: One level Home Equipment: Shower seat - built in (hand held shower head) Additional Comments: participation with OPPT services for R UE    Prior Function Prior Level of Function : Independent/Modified Independent             Mobility Comments: IND no AD for all ADLs, sefl care tasks and IADLs, working on a farm       Extremity/Trunk Assessment        Lower Extremity Assessment Lower Extremity Assessment: Generalized weakness    Cervical / Trunk Assessment Cervical / Trunk Assessment: Other exceptions;Lordotic (scoliosis)  Communication   Communication Communication: No apparent difficulties    Cognition Arousal: Alert Behavior During Therapy: WFL for tasks assessed/performed   PT - Cognitive impairments: No apparent impairments                         Following commands: Intact       Cueing       General Comments General comments (skin integrity, edema, etc.): R UE edema and pain. 96/62 at rest prior to therapy session   93/66 beginning of therapy session   Standing 103/69 O2 on RA 94-99%    Exercises     Assessment/Plan    PT Assessment Patient needs continued PT services  PT Problem List         PT Treatment Interventions DME instruction;Gait training;Stair training;Functional mobility training;Therapeutic activities;Therapeutic exercise;Balance training;Neuromuscular re-education;Patient/family education    PT Goals (Current goals can be found in the Care Plan section)  Acute Rehab PT Goals Patient Stated Goal: to go home PT Goal Formulation: With patient Time For Goal Achievement: 09/11/24 Potential to Achieve Goals: Good    Frequency Min 3X/week     Co-evaluation PT/OT/SLP Co-Evaluation/Treatment: Yes Reason for Co-Treatment: Complexity of the patient's impairments (multi-system involvement);Necessary to address cognition/behavior during functional activity;For patient/therapist safety;To  address functional/ADL transfers PT goals addressed during session: Mobility/safety with mobility;Balance;Proper use of DME OT goals addressed during session: ADL's and self-care;Proper use of Adaptive equipment and DME       AM-PAC PT 6 Clicks Mobility  Outcome Measure Help needed turning from your back to your side while in a flat bed without using bedrails?: A Little Help needed moving from lying on your back to sitting on the side of a flat bed without using bedrails?: A Little Help needed moving to and from a bed to a chair (including a wheelchair)?: A Little Help needed standing up from a chair using your arms (e.g., wheelchair or bedside chair)?: A Little Help needed to walk in hospital room?: A Little Help needed climbing 3-5 steps with a railing? : Total 6 Click Score: 16    End of Session Equipment Utilized During Treatment: Gait belt;Oxygen Activity Tolerance: Patient limited by fatigue Patient left: in chair;with call bell/phone within reach Nurse Communication: Mobility status PT Visit Diagnosis: Other abnormalities of gait and mobility (R26.89)    Time: 8798-8774 PT Time Calculation (min) (ACUTE ONLY): 24 min   Charges:  PT Evaluation $PT Eval Moderate Complexity: 1 Mod   PT General Charges $$ ACUTE PT VISIT: 1 Visit         [place], PT Acute Rehab   [place] VEAR Drone 08/28/2024, 1:59 PM

## 2024-08-28 NOTE — Progress Notes (Signed)
 NAME:  ANDREYAH NATIVIDAD, MRN:  [id number], DOB:  November 04, 1955, LOS: 3 ADMISSION DATE:  08/25/2024, CONSULTATION DATE:  @TODAY @ REFERRING MD:  ED, CHIEF COMPLAINT:  pulmonary embolism   History of Present Illness:  Ms. 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 CCM consulted for evaluation and management.  The patient notes that she has been struggling with shortness of breath for a while which she notes was related to recurrent pleural effusion. She had 1.1 L drained from her left pleural space on 10/29 and she felt improved after that. The shortness of breath recurred and so she went again for thoracetnesis on 11/19 where another 1.1 L of pleural fluid was drained from left pleural space. She did not feel much improved this time and so another thoracentesis was done on the right side draining 400 mL. She did not feel much better and she noted worsening shortness of breath. Her husband is a family doctor and checked her SPO2 and it was around 86%. Therefore, she was brought in to the ED for evaluation. She was also noted to be tachycardic upon his evaluation. Of note, patient had a long road trip recently which may have been another provoking factor aside form hx of metastatic breast cancer.  Upon arrival to ED, HR in 100s, BP stable off pressors, SPO2 > 90% on 2 L O2 by [place] CTA Chest was ordered showing a small R apical pneumothorax, bilateral central pulmonary emboli, RV dilated suggestive of RV strain  Pertinent  Medical History   Past Medical History:  Diagnosis Date   Asthma    Breast cancer (HCC)    right   Complication of anesthesia    Dyspnea    Dysrhythmia    2012   Headache    History of kidney stones    passed   Hypothyroidism    Pneumonia 01/26/2023   PONV (postoperative nausea and vomiting)    rt breast ca 08/2022   Right Breast    Scoliosis    Thyroid  disease    Significant Hospital Events: Including procedures, antibiotic start and stop dates in addition to other pertinent events   11/22 --> ED for hypoxia and SOB --> submassive PE --> IR for thrombectomy --> unstable SVT, DCCV --> thrombectomy completed 11/24 off levo  Interim History / Subjective:  On 2L [place] Breathing stable CXR today w/ persisting moderate pleural effusion L>R  Objective    Blood pressure 98/65, pulse 85, temperature 98 F (36.7 C), temperature source Oral, resp. rate (!) 21, height 5' 3 (1.6 m), weight 61 kg, SpO2 99%.        Intake/Output Summary (Last 24 hours) at 08/28/2024 9292 Last data filed at 08/28/2024 0600 Gross per 24 hour  Intake 860.55 ml  Output 150 ml  Net 710.55 ml   Filed Weights   08/25/24 1900 08/26/24 0500 08/28/24 0500  Weight: 59.7 kg 60.9 kg 61 kg    Examination: General: NAD HEENT: MM pink/moist; [place] in place Neuro: Aox3; MAE CV: s1s2, RRR, no m/r/g PULM:  dim clear BS bilaterally;  2L GI: soft, bsx4 active  Extremities: warm/dry, no edema  Skin: no rashes or lesions   Resolved problem list  Obstructive Shock:  Assessment and Plan   Acute Hypoxic Respiratory Failure: 2/2 to below Massive PE: evidence of RV strain on CT chest and my POCUS. PESI  score 167, BOVA score 7. 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  From 7A-7P if no response, please  call [phone]. After hours, please call ELink [phone].

## 2024-08-29 ENCOUNTER — Telehealth (HOSPITAL_COMMUNITY): Payer: Self-pay

## 2024-08-29 ENCOUNTER — Other Ambulatory Visit (HOSPITAL_COMMUNITY): Payer: Self-pay

## 2024-08-29 ENCOUNTER — Inpatient Hospital Stay (HOSPITAL_COMMUNITY)

## 2024-08-29 ENCOUNTER — Ambulatory Visit

## 2024-08-29 ENCOUNTER — Inpatient Hospital Stay

## 2024-08-29 ENCOUNTER — Inpatient Hospital Stay: Admitting: Hematology and Oncology

## 2024-08-29 DIAGNOSIS — I2602 Saddle embolus of pulmonary artery with acute cor pulmonale: Secondary | ICD-10-CM

## 2024-08-29 DIAGNOSIS — R918 Other nonspecific abnormal finding of lung field: Secondary | ICD-10-CM | POA: Diagnosis not present

## 2024-08-29 DIAGNOSIS — Z4682 Encounter for fitting and adjustment of non-vascular catheter: Secondary | ICD-10-CM | POA: Diagnosis not present

## 2024-08-29 DIAGNOSIS — J811 Chronic pulmonary edema: Secondary | ICD-10-CM | POA: Diagnosis not present

## 2024-08-29 DIAGNOSIS — J90 Pleural effusion, not elsewhere classified: Secondary | ICD-10-CM | POA: Diagnosis not present

## 2024-08-29 LAB — CBC
HCT: 28.9 % — ABNORMAL LOW (ref 36.0–46.0)
Hemoglobin: 9.2 g/dL — ABNORMAL LOW (ref 12.0–15.0)
MCH: 32.3 pg (ref 26.0–34.0)
MCHC: 31.8 g/dL (ref 30.0–36.0)
MCV: 101.4 fL — ABNORMAL HIGH (ref 80.0–100.0)
Platelets: 409 K/uL — ABNORMAL HIGH (ref 150–400)
RBC: 2.85 MIL/uL — ABNORMAL LOW (ref 3.87–5.11)
RDW: 15.7 % — ABNORMAL HIGH (ref 11.5–15.5)
WBC: 6.3 K/uL (ref 4.0–10.5)
nRBC: 0 % (ref 0.0–0.2)

## 2024-08-29 LAB — BODY FLUID CELL COUNT WITH DIFFERENTIAL
Eos, Fluid: 0 %
Lymphs, Fluid: 5 %
Monocyte-Macrophage-Serous Fluid: 3 % — ABNORMAL LOW (ref 50–90)
Neutrophil Count, Fluid: 1 % (ref 0–25)
Other Cells, Fluid: 91 %
Total Nucleated Cell Count, Fluid: 1571 uL — ABNORMAL HIGH (ref 0–1000)

## 2024-08-29 LAB — HEPARIN LEVEL (UNFRACTIONATED): Heparin Unfractionated: >1.1 [IU]/mL — ABNORMAL HIGH (ref 0.30–0.70)

## 2024-08-29 MED ORDER — SODIUM CHLORIDE 0.9% FLUSH: 10.0000 mL | INTRAVENOUS | Status: DC | PRN

## 2024-08-29 MED ORDER — SODIUM CHLORIDE 0.9% FLUSH: 10.0000 mL | Freq: Two times a day (BID) | INTRAVENOUS | Status: DC

## 2024-08-29 MED ORDER — POLYETHYLENE GLYCOL 3350 17 G PO PACK: 17.0000 g | PACK | Freq: Every day | ORAL | 0 refills | Status: DC | PRN

## 2024-08-29 MED ORDER — APIXABAN (ELIQUIS) VTE STARTER PACK (10MG AND 5MG): ORAL_TABLET | ORAL | 0 refills | Status: DC

## 2024-08-29 NOTE — Plan of Care (Signed)
  Problem: Clinical Measurements: Goal: Ability to maintain clinical measurements within normal limits will improve Outcome: Progressing   Problem: Clinical Measurements: Goal: Will remain free from infection Outcome: Progressing   Problem: Activity: Goal: Risk for activity intolerance will decrease Outcome: Progressing   Problem: Nutrition: Goal: Adequate nutrition will be maintained Outcome: Progressing   Problem: Elimination: Goal: Will not experience complications related to bowel motility Outcome: Progressing   Problem: Elimination: Goal: Will not experience complications related to urinary retention Outcome: Progressing   Problem: Pain Managment: Goal: General experience of comfort will improve and/or be controlled Outcome: Progressing   Problem: Safety: Goal: Ability to remain free from injury will improve Outcome: Progressing

## 2024-08-29 NOTE — Telephone Encounter (Signed)
 Pharmacy Patient Advocate Encounter  Insurance verification completed.    The patient is insured through [employer]/ [employer]. Patient has Medicare and is not eligible for a copay card, but may be able to apply for patient assistance or Medicare RX Payment Plan (Patient Must reach out to their plan, if eligible for payment plan), if available.    Ran test claim for Eliquis 5mg  and the current 30 day co-pay is $47.   This test claim was processed through [facility]- copay amounts may vary at other pharmacies due to [employer], or as the patient moves through the different stages of their insurance plan.

## 2024-08-29 NOTE — TOC Transition Note (Addendum)
 Transition of Care [facility]) - Discharge Note   Patient Details  Name: Caitlin [name] MRN: [id number] Date of Birth: 06-11-1956  [facility] CM/SW Contact:  Bascom Service, RN Phone Number: 08/29/2024, 11:19 AM   Clinical Narrative: [employer] rep Cindie aware/accepted for HHRN/PT/OT-pleurx cath cannister changes. Patient has some cannisters in rm to take home. Awaiting signature from pulmonary-Dr. Alva/John Payne(PA) who ordered insertion of Pleurx cath to sign carefusion order form for ongoing cannisters for home. Has own transport home.   -11:56a-Dr. Guden'a office nurse Kelsey-received carefusion form to have signed, will fax to caref[employer]in for ongoing cannisters. Per spouse states he's an attending-he will provide cannister changes/drainage no need for HHRN-[employer] rep aware of only HHPT/OT needed.. patient has cannisters for home in rm. No further CM needs.    Final next level of care: Home w Home Health Services Barriers to Discharge: No Barriers Identified   Patient Goals and CMS Choice Patient states their goals for this hospitalization and ongoing recovery are:: Home CMS Medicare.gov Compare Post Acute Care list provided to:: Patient Choice offered to / list presented to : Patient [place] ownership interest in [facility].provided to:: Patient    Discharge Placement                       Discharge Plan and Services Additional resources added to the After Visit Summary for   In-house Referral: NA Discharge Planning Services: CM Consult Post Acute Care Choice: Home Health          DME Arranged: N/A DME Agency: NA       HH Arranged: RN, PT, OT HH Agency: [facility] Date [facility] Agency Contacted: 08/29/24 Time HH Agency Contacted: 1118 Representative spoke with at [facility] Agency: Cindie  Social Drivers of Health (SDOH) Interventions SDOH Screenings   Food Insecurity: No Food Insecurity (08/26/2024)  Housing: Low Risk  (08/26/2024)   Transportation Needs: No Transportation Needs (08/26/2024)  Utilities: Not At Risk (08/26/2024)  Alcohol Screen: Low Risk  (05/27/2023)  Depression (PHQ2-9): Low Risk  (05/27/2023)  Physical Activity: Sufficiently Active (06/19/2020)   [facility] from [facility]  Social Connections: Moderately Integrated (08/26/2024)  Tobacco Use: Unknown (08/26/2024)  Health Literacy: Low Risk (11/12/2022)   Received from [facility]     Readmission Risk Interventions    08/28/2024    1:48 PM  Readmission Risk Prevention Plan  Transportation Screening Complete  PCP or Specialist Appt within 5-7 Days Complete  Home Care Screening Complete  Medication Review (RN CM) Complete

## 2024-08-29 NOTE — Discharge Summary (Signed)
 Physician Discharge Summary  Caitlin [name] FMW:[id number] DOB: Oct 19, 1955 DOA: 08/25/2024  PCP: Toribio Jerel MATSU, MD  Admit date: 08/25/2024 Discharge date: 08/29/2024  Admitted From: Home Disposition: Home  Recommendations for Outpatient Follow-up:  Follow up with PCP in 1 week  Outpatient follow-up with oncology/pulmonary/IR PleurX catheter management as per IR and pulmonary. Follow up in ED if symptoms worsen or new appear   Home Health: RN/PT/OT  Discharge Condition: Stable CODE STATUS: Full Diet recommendation: Regular  Brief/Interim Summary: 68 year old female with history of metastatic breast cancer, paroxysmal A-fib, moderate persistent asthma, hypothyroidism and recurrent pleural effusions requiring left thoracentesis on 08/01/2024 and 08/22/2024 presented with worsening shortness of breath and was found to have submassive PE.  She underwent thrombectomy by IR on 08/25/2024.  During the procedure, she had SVT with hemodynamic instability requiring DC cardioversion.  Afterwards, she was hypotensive requiring norepinephrine .  Subsequently, pressors were discontinued.  She was transferred to [facility] service.  She underwent left PleurX catheter placement by IR on 08/28/2024.  Subsequently, she has been started on Eliquis .  She feels better.  IR and pulmonary have cleared the patient for discharge.  Discharge patient home today.  Outpatient follow-up with PCP/pulmonary/IR/oncology.  Discharge Diagnoses:   Massive PE Acute respiratory failure with hypoxia -underwent thrombectomy by IR on 08/25/2024.  Was treated with heparin  drip. Subsequently, she has been started on Eliquis .  She feels better.  Currently on room air. -Discharge patient home today.  Recurrent presumed left pleural effusion - underwent left PleurX catheter placement by IR on 08/28/2024.  Respiratory status stable. IR and pulmonary have cleared the patient for discharge.  Outpatient follow-up with IR and  pulmonary  Mild apical pneumothorax on the right side - Pneumothorax was mild.  Subsequently resolved.  SVT: Resolved History of paroxysmal A-fib - Patient went into SVT during thrombectomy: Required cardioversion.  Currently on Eliquis .  Cardizem  has been resumed.  Outpatient follow-up with cardiology.  History of asthma - Continue albuterol  as needed  Metastatic breast cancer - Outpatient follow-up with oncology for resumption of chemotherapy   Discharge Instructions  Discharge Instructions     Ambulatory Pleural Drainage Schedule   Complete by: As directed    Pleural drainage schedule per Pulmonary / Oncology Teams   Diet - low sodium heart healthy   Complete by: As directed    Increase activity slowly   Complete by: As directed    No wound care   Complete by: As directed       Allergies as of 08/29/2024       Reactions   Penicillins Rash   childhood reaction Did it involve swelling of the face/tongue/throat, SOB, or low BP? No Did it involve sudden or severe rash/hives, skin peeling, or any reaction on the inside of your mouth or nose? Yes Did you need to seek medical attention at a hospital or doctor's office? No When did it last happen?      childhood If all above answers are NO, may proceed with cephalosporin use.          Medication List     STOP taking these medications    aspirin 81 MG tablet       TAKE these medications    albuterol  108 (90 Base) MCG/ACT inhaler Commonly known as: VENTOLIN  HFA Inhale 2 puffs into the lungs every 4 (four) hours as needed for shortness of breath (Asthma).   alendronate  70 MG tablet Commonly known as: Fosamax  Take 1 tablet (70 mg total)  by mouth once a week. Take with a full glass of water  on an empty stomach.   Apixaban  Starter Pack (10mg  and 5mg ) Commonly known as: ELIQUIS  STARTER PACK Take as directed on package: start with two-5mg  tablets twice daily for 7 days. On day 8, switch to one-5mg  tablet  twice daily.   cetirizine 10 MG tablet Commonly known as: ZYRTEC Take 10 mg by mouth daily.   dexamethasone  4 MG tablet Commonly known as: DECADRON  Take 1 tablets  by mouth daily for 3 days starting the day after chemotherapy. Take with food.   diltiazem  120 MG 24 hr capsule Commonly known as: CARDIZEM  CD Take 120 mg by mouth daily.   levothyroxine  75 MCG tablet Commonly known as: SYNTHROID  Take 75 mcg by mouth daily before breakfast.   lidocaine -prilocaine  cream Commonly known as: EMLA  Apply to affected area once   multivitamin with minerals tablet Take 1 tablet by mouth daily.   ondansetron  8 MG tablet Commonly known as: Zofran  Take 1 tablet (8 mg total) by mouth every 8 (eight) hours as needed for nausea or vomiting. Start on the third day after chemotherapy.   OVER THE COUNTER MEDICATION Take 1 capsule by mouth 2 (two) times daily. copaiba softgels   oxyCODONE  5 MG immediate release tablet Commonly known as: Oxy IR/ROXICODONE  Take 1 tablet (5 mg total) by mouth every 6 (six) hours as needed for severe pain (pain score 7-10).   polyethylene glycol 17 g packet Commonly known as: MIRALAX  / GLYCOLAX  Take 17 g by mouth daily as needed for moderate constipation.   prochlorperazine  10 MG tablet Commonly known as: COMPAZINE  Take 1 tablet (10 mg total) by mouth every 6 (six) hours as needed for nausea or vomiting.   Pulmicort  Flexhaler 180 MCG/ACT inhaler Generic drug: budesonide  Inhale 1 puff into the lungs daily.          Contact information for follow-up providers     Toribio Jerel MATSU, MD. Schedule an appointment as soon as possible for a visit in 1 week(s).   Specialty: Family Medicine Contact information: [address] [place] [address] [phone]              Contact information for after-discharge care     Home Medical Care     [facility]) .   Service: Home Health Services Contact information: [address] [place]  [address] [phone]                    Allergies  Allergen Reactions   Penicillins Rash    childhood reaction  Did it involve swelling of the face/tongue/throat, SOB, or low BP? No Did it involve sudden or severe rash/hives, skin peeling, or any reaction on the inside of your mouth or nose? Yes Did you need to seek medical attention at a hospital or doctor's office? No When did it last happen?      childhood If all above answers are NO, may proceed with cephalosporin use.      Consultations: PCCM/IR   Procedures/Studies: DG Chest Port 1 View Result Date: 08/29/2024 EXAM: 1 VIEW(S) XRAY OF THE CHEST 08/29/2024 05:08:00 AM COMPARISON: Portable chest dated 08/28/2024 04:51 AM. CLINICAL HISTORY: A left chest tube was inserted by radiology yesterday afternoon. 1.2 liters of serous pleural fluid was removed. FINDINGS: LINES, TUBES AND DEVICES: There is a left chest tube now in place with the tip in the apex. Left IJ central line  again terminates in the upper right atrium. LUNGS AND PLEURA: There are moderate-sized pleural effusions with a modest interval reduction in the left pleural effusion. There is patchy atelectasis or airspace disease in the lower lung fields. There is no measurable pneumothorax. HEART AND MEDIASTINUM: Cardiomegaly noted with increased central vascular prominence and development of mild interstitial edema. The mediastinum is stable. There is aortic tortuosity and atherosclerosis. BONES AND SOFT TISSUES: Right axillary clips. Moderate s-shaped thoracolumbar scoliosis. IMPRESSION: 1. Left chest tube in place with the tip in the apex. No measurable pneumothorax. 2. Moderate-sized pleural effusions with modest interval reduction in the left pleural effusion. 3. Patchy atelectasis or airspace disease in the lower lung fields. 4. Cardiomegaly with increased central vascular prominence and development of mild interstitial edema.  Electronically signed by: Francis Quam MD 08/29/2024 06:13 AM EST RP Workstation: [id number]   IR PERC PLEURAL DRAIN W/INDWELL CATH W/IMG GUIDE Result Date: 08/28/2024 CLINICAL DATA:  History of breast cancer. Recurrent question malignant pleural effusion EXAM: TUNNELED LEFT PLEURAL DRAINAGE CATHETER PLACEMENT COMPARISON:  CTA CHEST, 08/25/2024 MEDICATIONS: Ancef  2 gm IV; Antibiotic was administered in an appropriate time interval for the procedure. ANESTHESIA/SEDATION: Moderate (conscious) sedation was employed during this procedure. A total of Versed  2 mg and Fentanyl  100 mcg was administered intravenously. Moderate Sedation Time: 17 minutes. The patient's level of consciousness and vital signs were monitored continuously by radiology nursing throughout the procedure under my direct supervision. FLUOROSCOPY: Radiation Exposure Index and estimated peak skin dose (PSD); Reference air kerma (RAK), 1 mGy. COMPLICATIONS: None immediate. PROCEDURE: The procedure, risks, benefits, and alternatives were explained to the patient and/or patient's representative, who wishes to proceed with the placement of this permanent pleural catheter as they are seeking palliative care. The patient and/or patient's representative understands and consents to the procedure. The LEFT lateral chest and upper abdomen were prepped and draped in a sterile fashion, and a sterile drape was applied covering the operative field. A sterile gown and sterile gloves were used for the procedure. Initial ultrasound scanning and fluoroscopic imaging demonstrates a recurrent moderate to large pleural effusion. Under direct ultrasound guidance, the inferior lateral pleural space was accessed with a Yueh sheath needle after the overlying soft tissues were anesthetized with 1% lidocaine  with epinephrine . An Amplatz super stiff wire was then advanced under fluoroscopy into the pleural space. A 15.5 Fr tunneled Pleur-X catheter was tunneled from an  incision within the right upper abdominal quadrant to the access site. The pleural access site was serially dilated under fluoroscopy, ultimately allowing placement of a peel-away sheath. The catheter was advanced through the peel-away sheath. The sheath was then removed. Final catheter positioning was confirmed with a fluoroscopic radiographic image. The access incision was closed with an interrupted subcutaneous subcuticular 2-0 Vicryl, then Dermabond was applied over the skin. A 2-0 Ethilon retention suture was applied at the catheter exit site. Large volume thoracentesis was performed through the new catheter utilizing provided bulb vacuum assisted drainage bag. Dressings were applied. The patient tolerated the above procedure well without immediate postprocedural complication. FINDINGS: Preprocedural ultrasound scanning demonstrates a recurrent moderate sized LEFT sided pleural effusion. After ultrasound and fluoroscopic guided placement, the catheter is directed basilar, then apically Following catheter placement, approximately 1200 mL of thin serous pleural fluid was removed. IMPRESSION: Successful placement of permanent, tunneled LEFT pleural drainage catheter via lateral approach. 1200 mL of thin serous pleural fluid was removed following catheter placement. RECOMMENDATIONS: Intermittent drainage instructions per pulmonary/oncology teams. Thom Hall, MD [facility] Electronically Signed   By: Thom Hall M.D.   On: 08/28/2024 17:48   DG Chest 1 View Result Date: 08/28/2024 EXAM: 1 VIEW(S) XRAY OF THE CHEST 08/28/2024 05:10:00 AM COMPARISON: Portable chest 08/26/2024 at 4:04 am. CLINICAL HISTORY: Pleural effusion, bilateral. FINDINGS: LINES, TUBES AND DEVICES: Left IJ central line terminates at the superior cavoatrial junction. LUNGS AND PLEURA: A very small right pneumothorax again is noted with no interval worsening. There are persisting  moderate-sized pleural effusions greater on the left. No focal pulmonary opacity. HEART AND MEDIASTINUM: There is cardiomegaly and central vascular prominence without appreciable edema. Stable mediastinum. BONES AND SOFT TISSUES: Right axillary surgical clips. There is moderate thoracic dextroscoliosis with degenerative changes. No acute osseous abnormality. IMPRESSION: 1. Very small right pneumothorax, stable. 2. Persisting moderate-sized pleural effusions, greater on the left also unchanged , overlying lung opacities. 3. Cardiomegaly and central vascular prominence without appreciable edema. Electronically signed by: Francis Quam MD 08/28/2024 06:27 AM EST RP Workstation: [id number]   ECHOCARDIOGRAM LIMITED Result Date: 08/26/2024    ECHOCARDIOGRAM LIMITED REPORT   Patient Name:   Caitlin [name] Date of Exam: 08/26/2024 Medical Rec #:  [id number]        Height:       63.0 in Accession #:    [id number]       Weight:       134.3 lb Date of Birth:  1956-02-11       BSA:          1.632 m Patient Age:    68 years         BP:           94/60 mmHg Patient Gender: F                HR:           79 bpm. Exam Location:  Inpatient Procedure: 2D Echo, Color Doppler, Cardiac Doppler and Limited Echo (Both            Spectral and Color Flow Doppler were utilized during procedure). Indications:    Pulmonary Embolus I26.09  History:        Patient has prior history of Echocardiogram examinations, most                 recent 07/27/2024.  Sonographer:    Tinnie Gosling RDCS Referring Phys: [id number] PAULA SOUTHERLY IMPRESSIONS  1. Left ventricular ejection fraction, by estimation, is 60 to 65%. The left ventricle has normal function. There is mild left ventricular hypertrophy.  2. Right ventricular systolic function is mildly reduced. The right ventricular size is mildly enlarged. There is normal pulmonary artery systolic pressure. The estimated right ventricular systolic pressure is 28.8 mmHg.  3. The aortic valve was not well  visualized.  4. The inferior vena cava is normal in size with <50% respiratory variability, suggesting right atrial pressure of 8 mmHg. FINDINGS  Left Ventricle: Left ventricular ejection fraction, by estimation, is 60 to 65%. The left ventricle has normal function. There is mild left ventricular hypertrophy. Right Ventricle: The right ventricular size is mildly enlarged. Right ventricular systolic function is mildly reduced. There is normal pulmonary artery systolic pressure. The tricuspid regurgitant velocity is 2.28 m/s, and with an assumed right atrial pressure of 8 mmHg, the estimated right ventricular systolic pressure is 28.8 mmHg. Left Atrium: Left atrial size was normal in size. Tricuspid Valve: The tricuspid valve is grossly normal. Tricuspid valve regurgitation is mild. Aortic  Valve: The aortic valve was not well visualized. Pulmonic Valve: Pulmonic valve regurgitation is trivial. Venous: The inferior vena cava is normal in size with less than 50% respiratory variability, suggesting right atrial pressure of 8 mmHg. LEFT VENTRICLE PLAX 2D LVIDd:         3.60 cm LVIDs:         1.70 cm LV PW:         1.30 cm LV IVS:        1.30 cm  RIGHT VENTRICLE            IVC RV S prime:     9.03 cm/s  IVC diam: 1.80 cm TAPSE (M-mode): 1.7 cm RIGHT ATRIUM           Index RA Area:     12.30 cm RA Volume:   31.30 ml  19.17 ml/m  TRICUSPID VALVE TR Peak grad:   20.8 mmHg TR Vmax:        228.00 cm/s Soyla Merck MD Electronically signed by Soyla Merck MD Signature Date/Time: 08/26/2024/9:13:19 AM    Final    IR NON-TUNNELED CENTRAL VENOUS CATH [facility] W IMG Result Date: 08/26/2024 INDICATION: 68 year old with metastatic breast cancer and shortness of breath. CT demonstrates bilateral central pulmonary embolism and right heart strain. EXAM: 1. Pulmonary arteriography with pressures 2. Pulmonary artery thrombectomy 3. Ultrasound guidance for vascular access x2 4. Placement of non tunneled central venous catheter.  COMPARISON:  Chest CTA 08/25/2024 MEDICATIONS: Moderate sedation ANESTHESIA/SEDATION: Moderate (conscious) sedation was employed during this procedure. A total of Versed  4 mg and Fentanyl  100 mcg was administered intravenously by the radiology nurse. Total intra-service moderate Sedation Time: 125 minutes. The patient's level of consciousness and vital signs were monitored continuously by radiology nursing throughout the procedure under my direct supervision. FLUOROSCOPY: Radiation Exposure Index (as provided by the fluoroscopic device): 249 mGy Kerma CONTRAST:  100 mL Omnipaque  300 COMPLICATIONS: None immediate. TECHNIQUE: Informed written consent was obtained from the patient after a thorough discussion of the procedural risks, benefits and alternatives. All questions were addressed. A timeout was performed prior to the initiation of the procedure. Ultrasound demonstrated a patent left internal jugular vein. Ultrasound image was saved for documentation. Left neck was prepped and draped in sterile fashion. Maximal barrier sterile technique was utilized including caps, mask, sterile gowns, sterile gloves, sterile drape, hand hygiene and skin antiseptic. Left neck was anesthetized with 1% lidocaine . Small incision was made. Using ultrasound guidance, 21 gauge needle was directed into the left internal jugular vein. Micropuncture catheter was placed. Bentson wire was advanced into the superior vena cava. Peel-away sheath was placed. Dual lumen power PICC line was cut to an appropriate length and advanced through the peel-away sheath. The tip was placed at the superior cavoatrial junction. Both lumens aspirated and flushed well. Both lumens were capped and clamped. Central line was sutured to skin and a bandage was placed. Right groin was prepped and draped in sterile fashion. Maximal barrier sterile technique was utilized including caps, mask, sterile gowns, sterile gloves, sterile drape, hand hygiene and skin  antiseptic. Ultrasound confirmed a patent right common femoral vein. Ultrasound image was saved for documentation. Skin was anesthetized with 1% lidocaine . Small incision was made. Using ultrasound guidance, 21 gauge needle was directed into the right common femoral vein and micropuncture dilator set was placed. 8 French vascular sheath was placed over a Bentson wire. Curved pigtail catheter was advanced into the right heart. At this point, patient developed supraclavicular tachycardia.  The supraventricular tachycardia was not subsiding and the patient's blood pressure was dropping. Critical care performed electrical cardioversion emergently to get the patient back into normal sinus rhythm. Pigtail catheter was advanced back into the right atrium. It was difficult to advance a pigtail catheter into the right ventricle. Pigtail catheter was exchanged for a C2 catheter. C2 catheter was successfully advanced into the right ventricle and main pulmonary artery using a Bentson wire. Pulmonary artery pressure was obtained. C2 catheter was advanced into the right pulmonary artery and exchanged for a Vert catheter. Short-tip superstiff Amplatz wire was placed. 8 French vascular sheath was exchanged for a 24 French sheath. Inari 24 French FlowTriever catheter was advanced over the wire into the distal right pulmonary artery. Aspiration thrombectomy was performed. Large amount of clot was removed. The aspiration catheter was pulled back to the pulmonary artery bifurcation and a pulmonary arteriogram was performed. Vert catheter was advanced into the left pulmonary arteries using a buddy wire technique. Attempted to advance a curved 20 French Triever into the left pulmonary artery but this was causing the 24 French catheter to retract and buckle. The 20 French catheter was completely removed. The 24 French catheter was advanced into the main left pulmonary artery and aspiration thrombectomy was performed. Large amount of clot  was removed. Pulmonary arteriogram was performed in the left pulmonary artery. Additional aspiration thrombectomy was performed. Final pulmonary artery pressure was obtained. The aspiration catheter was completely removed. The right groin sheath was removed using a pursestring suture and external closure device. Right groin hemostasis at the end of the procedure. FINDINGS: Pre thrombectomy pulmonary artery pressure: 33/20, mean 26 mmHg Post thrombectomy pulmonary artery pressure: 28/19, mean 23 mmHg Pulmonary arteriogram demonstrated flow in the main left and right pulmonary arteries after thrombectomy. IMPRESSION: 1. Successful catheter directed mechanical thrombectomy of bilateral pulmonary emboli. 2. Placement of left jugular central venous catheter. Catheter tip at the superior cavoatrial junction Electronically Signed   By: Juliene Balder M.D.   On: 08/26/2024 08:10   IR US  Guide Vasc Access Right Result Date: 08/26/2024 INDICATION: 68 year old with metastatic breast cancer and shortness of breath. CT demonstrates bilateral central pulmonary embolism and right heart strain. EXAM: 1. Pulmonary arteriography with pressures 2. Pulmonary artery thrombectomy 3. Ultrasound guidance for vascular access x2 4. Placement of non tunneled central venous catheter. COMPARISON:  Chest CTA 08/25/2024 MEDICATIONS: Moderate sedation ANESTHESIA/SEDATION: Moderate (conscious) sedation was employed during this procedure. A total of Versed  4 mg and Fentanyl  100 mcg was administered intravenously by the radiology nurse. Total intra-service moderate Sedation Time: 125 minutes. The patient's level of consciousness and vital signs were monitored continuously by radiology nursing throughout the procedure under my direct supervision. FLUOROSCOPY: Radiation Exposure Index (as provided by the fluoroscopic device): 249 mGy Kerma CONTRAST:  100 mL Omnipaque  300 COMPLICATIONS: None immediate. TECHNIQUE: Informed written consent was obtained  from the patient after a thorough discussion of the procedural risks, benefits and alternatives. All questions were addressed. A timeout was performed prior to the initiation of the procedure. Ultrasound demonstrated a patent left internal jugular vein. Ultrasound image was saved for documentation. Left neck was prepped and draped in sterile fashion. Maximal barrier sterile technique was utilized including caps, mask, sterile gowns, sterile gloves, sterile drape, hand hygiene and skin antiseptic. Left neck was anesthetized with 1% lidocaine . Small incision was made. Using ultrasound guidance, 21 gauge needle was directed into the left internal jugular vein. Micropuncture catheter was placed. Bentson wire was advanced into the  superior vena cava. Peel-away sheath was placed. Dual lumen power PICC line was cut to an appropriate length and advanced through the peel-away sheath. The tip was placed at the superior cavoatrial junction. Both lumens aspirated and flushed well. Both lumens were capped and clamped. Central line was sutured to skin and a bandage was placed. Right groin was prepped and draped in sterile fashion. Maximal barrier sterile technique was utilized including caps, mask, sterile gowns, sterile gloves, sterile drape, hand hygiene and skin antiseptic. Ultrasound confirmed a patent right common femoral vein. Ultrasound image was saved for documentation. Skin was anesthetized with 1% lidocaine . Small incision was made. Using ultrasound guidance, 21 gauge needle was directed into the right common femoral vein and micropuncture dilator set was placed. 8 French vascular sheath was placed over a Bentson wire. Curved pigtail catheter was advanced into the right heart. At this point, patient developed supraclavicular tachycardia. The supraventricular tachycardia was not subsiding and the patient's blood pressure was dropping. Critical care performed electrical cardioversion emergently to get the patient back  into normal sinus rhythm. Pigtail catheter was advanced back into the right atrium. It was difficult to advance a pigtail catheter into the right ventricle. Pigtail catheter was exchanged for a C2 catheter. C2 catheter was successfully advanced into the right ventricle and main pulmonary artery using a Bentson wire. Pulmonary artery pressure was obtained. C2 catheter was advanced into the right pulmonary artery and exchanged for a Vert catheter. Short-tip superstiff Amplatz wire was placed. 8 French vascular sheath was exchanged for a 24 French sheath. Inari 24 French FlowTriever catheter was advanced over the wire into the distal right pulmonary artery. Aspiration thrombectomy was performed. Large amount of clot was removed. The aspiration catheter was pulled back to the pulmonary artery bifurcation and a pulmonary arteriogram was performed. Vert catheter was advanced into the left pulmonary arteries using a buddy wire technique. Attempted to advance a curved 20 French Triever into the left pulmonary artery but this was causing the 24 French catheter to retract and buckle. The 20 French catheter was completely removed. The 24 French catheter was advanced into the main left pulmonary artery and aspiration thrombectomy was performed. Large amount of clot was removed. Pulmonary arteriogram was performed in the left pulmonary artery. Additional aspiration thrombectomy was performed. Final pulmonary artery pressure was obtained. The aspiration catheter was completely removed. The right groin sheath was removed using a pursestring suture and external closure device. Right groin hemostasis at the end of the procedure. FINDINGS: Pre thrombectomy pulmonary artery pressure: 33/20, mean 26 mmHg Post thrombectomy pulmonary artery pressure: 28/19, mean 23 mmHg Pulmonary arteriogram demonstrated flow in the main left and right pulmonary arteries after thrombectomy. IMPRESSION: 1. Successful catheter directed mechanical  thrombectomy of bilateral pulmonary emboli. 2. Placement of left jugular central venous catheter. Catheter tip at the superior cavoatrial junction Electronically Signed   By: Juliene Balder M.D.   On: 08/26/2024 08:10   IR US  Guide Vasc Access Left Result Date: 08/26/2024 INDICATION: 68 year old with metastatic breast cancer and shortness of breath. CT demonstrates bilateral central pulmonary embolism and right heart strain. EXAM: 1. Pulmonary arteriography with pressures 2. Pulmonary artery thrombectomy 3. Ultrasound guidance for vascular access x2 4. Placement of non tunneled central venous catheter. COMPARISON:  Chest CTA 08/25/2024 MEDICATIONS: Moderate sedation ANESTHESIA/SEDATION: Moderate (conscious) sedation was employed during this procedure. A total of Versed  4 mg and Fentanyl  100 mcg was administered intravenously by the radiology nurse. Total intra-service moderate Sedation Time: 125 minutes. The  patient's level of consciousness and vital signs were monitored continuously by radiology nursing throughout the procedure under my direct supervision. FLUOROSCOPY: Radiation Exposure Index (as provided by the fluoroscopic device): 249 mGy Kerma CONTRAST:  100 mL Omnipaque  300 COMPLICATIONS: None immediate. TECHNIQUE: Informed written consent was obtained from the patient after a thorough discussion of the procedural risks, benefits and alternatives. All questions were addressed. A timeout was performed prior to the initiation of the procedure. Ultrasound demonstrated a patent left internal jugular vein. Ultrasound image was saved for documentation. Left neck was prepped and draped in sterile fashion. Maximal barrier sterile technique was utilized including caps, mask, sterile gowns, sterile gloves, sterile drape, hand hygiene and skin antiseptic. Left neck was anesthetized with 1% lidocaine . Small incision was made. Using ultrasound guidance, 21 gauge needle was directed into the left internal jugular vein.  Micropuncture catheter was placed. Bentson wire was advanced into the superior vena cava. Peel-away sheath was placed. Dual lumen power PICC line was cut to an appropriate length and advanced through the peel-away sheath. The tip was placed at the superior cavoatrial junction. Both lumens aspirated and flushed well. Both lumens were capped and clamped. Central line was sutured to skin and a bandage was placed. Right groin was prepped and draped in sterile fashion. Maximal barrier sterile technique was utilized including caps, mask, sterile gowns, sterile gloves, sterile drape, hand hygiene and skin antiseptic. Ultrasound confirmed a patent right common femoral vein. Ultrasound image was saved for documentation. Skin was anesthetized with 1% lidocaine . Small incision was made. Using ultrasound guidance, 21 gauge needle was directed into the right common femoral vein and micropuncture dilator set was placed. 8 French vascular sheath was placed over a Bentson wire. Curved pigtail catheter was advanced into the right heart. At this point, patient developed supraclavicular tachycardia. The supraventricular tachycardia was not subsiding and the patient's blood pressure was dropping. Critical care performed electrical cardioversion emergently to get the patient back into normal sinus rhythm. Pigtail catheter was advanced back into the right atrium. It was difficult to advance a pigtail catheter into the right ventricle. Pigtail catheter was exchanged for a C2 catheter. C2 catheter was successfully advanced into the right ventricle and main pulmonary artery using a Bentson wire. Pulmonary artery pressure was obtained. C2 catheter was advanced into the right pulmonary artery and exchanged for a Vert catheter. Short-tip superstiff Amplatz wire was placed. 8 French vascular sheath was exchanged for a 24 French sheath. Inari 24 French FlowTriever catheter was advanced over the wire into the distal right pulmonary artery.  Aspiration thrombectomy was performed. Large amount of clot was removed. The aspiration catheter was pulled back to the pulmonary artery bifurcation and a pulmonary arteriogram was performed. Vert catheter was advanced into the left pulmonary arteries using a buddy wire technique. Attempted to advance a curved 20 French Triever into the left pulmonary artery but this was causing the 24 French catheter to retract and buckle. The 20 French catheter was completely removed. The 24 French catheter was advanced into the main left pulmonary artery and aspiration thrombectomy was performed. Large amount of clot was removed. Pulmonary arteriogram was performed in the left pulmonary artery. Additional aspiration thrombectomy was performed. Final pulmonary artery pressure was obtained. The aspiration catheter was completely removed. The right groin sheath was removed using a pursestring suture and external closure device. Right groin hemostasis at the end of the procedure. FINDINGS: Pre thrombectomy pulmonary artery pressure: 33/20, mean 26 mmHg Post thrombectomy pulmonary artery pressure: 28/19,  mean 23 mmHg Pulmonary arteriogram demonstrated flow in the main left and right pulmonary arteries after thrombectomy. IMPRESSION: 1. Successful catheter directed mechanical thrombectomy of bilateral pulmonary emboli. 2. Placement of left jugular central venous catheter. Catheter tip at the superior cavoatrial junction Electronically Signed   By: Juliene Balder M.D.   On: 08/26/2024 08:10   IR THROMBECT PRIM MECH INIT (INCLU) MOD SED Result Date: 08/26/2024 INDICATION: 68 year old with metastatic breast cancer and shortness of breath. CT demonstrates bilateral central pulmonary embolism and right heart strain. EXAM: 1. Pulmonary arteriography with pressures 2. Pulmonary artery thrombectomy 3. Ultrasound guidance for vascular access x2 4. Placement of non tunneled central venous catheter. COMPARISON:  Chest CTA 08/25/2024 MEDICATIONS:  Moderate sedation ANESTHESIA/SEDATION: Moderate (conscious) sedation was employed during this procedure. A total of Versed  4 mg and Fentanyl  100 mcg was administered intravenously by the radiology nurse. Total intra-service moderate Sedation Time: 125 minutes. The patient's level of consciousness and vital signs were monitored continuously by radiology nursing throughout the procedure under my direct supervision. FLUOROSCOPY: Radiation Exposure Index (as provided by the fluoroscopic device): 249 mGy Kerma CONTRAST:  100 mL Omnipaque  300 COMPLICATIONS: None immediate. TECHNIQUE: Informed written consent was obtained from the patient after a thorough discussion of the procedural risks, benefits and alternatives. All questions were addressed. A timeout was performed prior to the initiation of the procedure. Ultrasound demonstrated a patent left internal jugular vein. Ultrasound image was saved for documentation. Left neck was prepped and draped in sterile fashion. Maximal barrier sterile technique was utilized including caps, mask, sterile gowns, sterile gloves, sterile drape, hand hygiene and skin antiseptic. Left neck was anesthetized with 1% lidocaine . Small incision was made. Using ultrasound guidance, 21 gauge needle was directed into the left internal jugular vein. Micropuncture catheter was placed. Bentson wire was advanced into the superior vena cava. Peel-away sheath was placed. Dual lumen power PICC line was cut to an appropriate length and advanced through the peel-away sheath. The tip was placed at the superior cavoatrial junction. Both lumens aspirated and flushed well. Both lumens were capped and clamped. Central line was sutured to skin and a bandage was placed. Right groin was prepped and draped in sterile fashion. Maximal barrier sterile technique was utilized including caps, mask, sterile gowns, sterile gloves, sterile drape, hand hygiene and skin antiseptic. Ultrasound confirmed a patent right common  femoral vein. Ultrasound image was saved for documentation. Skin was anesthetized with 1% lidocaine . Small incision was made. Using ultrasound guidance, 21 gauge needle was directed into the right common femoral vein and micropuncture dilator set was placed. 8 French vascular sheath was placed over a Bentson wire. Curved pigtail catheter was advanced into the right heart. At this point, patient developed supraclavicular tachycardia. The supraventricular tachycardia was not subsiding and the patient's blood pressure was dropping. Critical care performed electrical cardioversion emergently to get the patient back into normal sinus rhythm. Pigtail catheter was advanced back into the right atrium. It was difficult to advance a pigtail catheter into the right ventricle. Pigtail catheter was exchanged for a C2 catheter. C2 catheter was successfully advanced into the right ventricle and main pulmonary artery using a Bentson wire. Pulmonary artery pressure was obtained. C2 catheter was advanced into the right pulmonary artery and exchanged for a Vert catheter. Short-tip superstiff Amplatz wire was placed. 8 French vascular sheath was exchanged for a 24 French sheath. Inari 24 French FlowTriever catheter was advanced over the wire into the distal right pulmonary artery. Aspiration thrombectomy was performed.  Large amount of clot was removed. The aspiration catheter was pulled back to the pulmonary artery bifurcation and a pulmonary arteriogram was performed. Vert catheter was advanced into the left pulmonary arteries using a buddy wire technique. Attempted to advance a curved 20 French Triever into the left pulmonary artery but this was causing the 24 French catheter to retract and buckle. The 20 French catheter was completely removed. The 24 French catheter was advanced into the main left pulmonary artery and aspiration thrombectomy was performed. Large amount of clot was removed. Pulmonary arteriogram was performed in the  left pulmonary artery. Additional aspiration thrombectomy was performed. Final pulmonary artery pressure was obtained. The aspiration catheter was completely removed. The right groin sheath was removed using a pursestring suture and external closure device. Right groin hemostasis at the end of the procedure. FINDINGS: Pre thrombectomy pulmonary artery pressure: 33/20, mean 26 mmHg Post thrombectomy pulmonary artery pressure: 28/19, mean 23 mmHg Pulmonary arteriogram demonstrated flow in the main left and right pulmonary arteries after thrombectomy. IMPRESSION: 1. Successful catheter directed mechanical thrombectomy of bilateral pulmonary emboli. 2. Placement of left jugular central venous catheter. Catheter tip at the superior cavoatrial junction Electronically Signed   By: Juliene Balder M.D.   On: 08/26/2024 08:10   DG CHEST PORT 1 VIEW Result Date: 08/26/2024 EXAM: 1 VIEW(S) XRAY OF THE CHEST 08/26/2024 04:10:00 AM COMPARISON: 08/25/2024 CLINICAL HISTORY: Pneumothorax FINDINGS: LINES, TUBES AND DEVICES: Left IJ central venous catheter in place with distal tip at proximal right atrium. LUNGS AND PLEURA: A trace right apical pneumothorax is present and has decreased from the previous exam. Persistent bilateral pleural effusions, stable to increased in the interval. New mild to moderate interstitial edema with worsening aeration to both lower lung zones. HEART AND MEDIASTINUM: No acute abnormality of the cardiac and mediastinal silhouettes. BONES AND SOFT TISSUES: Thoracic dextrocurvature. No acute osseous abnormality. IMPRESSION: 1. Trace right apical pneumothorax, decreased from the previous exam. 2. New mild to moderate interstitial edema with worsening aeration to both lower lung zones. 3. Bilateral pleural effusions , stable to increased in the interval. Electronically signed by: Waddell Calk MD 08/26/2024 04:38 AM EST RP Workstation: [id number]   CT Angio Chest PE W and/or Wo Contrast Result Date:  08/25/2024 EXAM: CTA of the Chest with contrast for PE 08/25/2024 01:07:37 PM TECHNIQUE: CTA of the chest was performed after the administration of intravenous contrast. Multiplanar reformatted images are provided for review. MIP images are provided for review. Automated exposure control, iterative reconstruction, and/or weight based adjustment of the mA/kV was utilized to reduce the radiation dose to as low as reasonably achievable. COMPARISON: Chest radiograph 08/25/2024. CT chest 07/30/2024. CLINICAL HISTORY: Pulmonary embolism (PE) suspected, high prob. FINDINGS: PULMONARY ARTERIES: Pulmonary arteries are adequately opacified for evaluation. Positive examination for multiple bilateral central and segmental pulmonary emboli including right and left main pulmonary arteries and bilateral upper and lower lobe branches. Moderate to high clot burden. Enlarged main pulmonary artery may indicate pulmonary arterial hypertension. MEDIASTINUM: RA to RV ratio measures 2.6 suggesting likely right heart strain. There is no acute abnormality of the thoracic aorta. Calcification of the aorta. LYMPH NODES: Mediastinal and bilateral hilar lymphadenopathy. No axillary lymphadenopathy. LUNGS AND PLEURA: Patchy areas of infiltration or consolidation in the lungs. Moderate bilateral pleural effusions with basilar atelectasis or consolidation bilaterally. Small right pneumothorax as seen on prior chest radiographs pse . UPPER ABDOMEN: Limited images of the upper abdomen are unremarkable. SOFT TISSUES AND BONES: Soft tissue mass in the right  chest wall measuring at least 4 x 12.1 cm in diameter, incompletely included within the field of view. This appears similar to the prior study. Previous right mastectomy. No acute bone abnormality. Thoracolumbar scoliosis convex towards the left  . NOTE CRITICAL RESULT: Critical results/urgent findings were called at 1:18 pm on 08/25/2024 by radiologist W. Okey Gravely, MD to Lavanda Lesches.  IMPRESSION: 1. Multiple bilateral central and segmental pulmonary emboli with moderate to high clot burden and CT evidence of right heart strain (RA:RV ratio 2.6). 2. Small right pneumothorax, seen on prior chest radiographs. 3. Moderate bilateral pleural effusions with basilar atelectasis or consolidation bilaterally. 4. Mediastinal and bilateral hilar lymphadenopathy. 5. Soft tissue mass in the right chest wall, incompletely included within the field of view, similar to prior study. 6. Note critical result. Critical results/urgent findings were called at 1:18 pm on 08/25/2024 by radiologist w. okey gravely, md to [facility]. Electronically signed by: Elsie Gravely MD 08/25/2024 01:26 PM EST RP Workstation: [id number]   DG Chest Portable 1 View Result Date: 08/25/2024 CLINICAL DATA:  Shortness of breath. EXAM: PORTABLE CHEST 1 VIEW COMPARISON:  Chest radiograph dated 08/24/2024. FINDINGS: Small left pleural effusion and left lung base atelectasis or infiltrate. Trace right pleural effusion noted. Small right apical pneumothorax minimally increased since the prior radiograph. Stable cardiac silhouette. Degenerative changes of the spine and scoliosis. No acute osseous pathology. IMPRESSION: 1. Small right apical pneumothorax minimally increased since the prior radiograph. 2. Small left pleural effusion and left lung base atelectasis or infiltrate. Electronically Signed   By: Vanetta Chou M.D.   On: 08/25/2024 11:54   DG Chest 1 View Result Date: 08/24/2024 CLINICAL DATA:  Status post right thoracentesis with suggestion of small apical pneumothorax on immediate postprocedural chest x-ray. EXAM: DG CHEST 1V COMPARISON:  Prior chest x-ray at 1232 hours FINDINGS: Stable suggestion of a less than 5% right apical pneumothorax. Improved aeration at the right lung base. Trace residual right pleural effusion and small to moderate left pleural effusion. Left basilar atelectasis present. IMPRESSION: Stable  less than 5% right apical pneumothorax. Improved aeration at the right lung base. Trace residual right pleural effusion and small to moderate left pleural effusion. Electronically Signed   By: Marcey Moan M.D.   On: 08/24/2024 13:55   DG Chest Port 1 View Result Date: 08/24/2024 CLINICAL DATA:  Status post right-sided thoracentesis. EXAM: PORTABLE CHEST 1 VIEW COMPARISON:  One view chest 08/22/2024 FINDINGS: Interval decrease right-sided pleural effusion with a small residual effusion still present. Tiny apical pneumothorax. Left-sided basilar atelectasis and pleural effusion still present. IMPRESSION: Tiny apical pneumothorax. Electronically Signed   By: Cordella Banner   On: 08/24/2024 13:14   US  THORACENTESIS ASP PLEURAL SPACE W/IMG GUIDE Result Date: 08/24/2024 INDICATION: 68 year old female with history of recurrent pleural effusion. Request for therapeutic right thoracentesis. EXAM: ULTRASOUND GUIDED RIGHT THORACENTESIS MEDICATIONS: 10 mL 1% lidocaine . COMPLICATIONS: None immediate. PROCEDURE: An ultrasound guided thoracentesis was thoroughly discussed with the patient and questions answered. The benefits, risks, alternatives and complications were also discussed. The patient understands and wishes to proceed with the procedure. Written consent was obtained. Ultrasound was performed to localize and mark an adequate pocket of fluid in the right chest. The area was then prepped and draped in the normal sterile fashion. 1% Lidocaine  was used for local anesthesia. Under ultrasound guidance a 6 Fr Safe-T-Centesis catheter was introduced. Thoracentesis was performed. The catheter was removed and a dressing applied. FINDINGS: A total of approximately 400  mL of cloudy, yellow fluid was removed. Samples were sent to the laboratory as requested by the clinical team. IMPRESSION: Successful ultrasound guided right thoracentesis yielding 400 mL of pleural fluid. Performed by: Kacie Matthews PA-C Electronically  Signed   By: Cordella Banner   On: 08/24/2024 12:52   US  THORACENTESIS ASP PLEURAL SPACE W/IMG GUIDE Result Date: 08/22/2024 INDICATION: Patient with history of metastatic breast cancer, bilateral pleural effusions; request received for therapeutic left thoracentesis. EXAM: ULTRASOUND GUIDED THERAPEUTIC LEFT THORACENTESIS MEDICATIONS: 8 mL 1% lidocaine  with epinephrine  to skin/subcutaneous tissue COMPLICATIONS: None immediate. PROCEDURE: An ultrasound guided thoracentesis was thoroughly discussed with the patient and questions answered. The benefits, risks, alternatives and complications were also discussed. The patient understands and wishes to proceed with the procedure. Written consent was obtained. Ultrasound was performed to localize and mark an adequate pocket of fluid in the left chest. The area was then prepped and draped in the normal sterile fashion. 1% Lidocaine  was used for local anesthesia. Under ultrasound guidance a 6 Fr Safe-T-Centesis catheter was introduced. Thoracentesis was performed. The catheter was removed and a dressing applied. FINDINGS: A total of approximately 1.1 liters of yellow fluid was removed. Due to patient coughing only the above amount of fluid was removed today. IMPRESSION: Successful ultrasound guided therapeutic left thoracentesis yielding 1.1 liters of pleural fluid. Performed by: Franky Rakers, PA-C Electronically Signed   By: JONETTA Faes M.D.   On: 08/22/2024 17:02   DG Chest 1 View Result Date: 08/22/2024 CLINICAL DATA:  Status post left-sided thoracentesis. EXAM: CHEST  1 VIEW COMPARISON:  Chest CT dated 08/21/2024. FINDINGS: Interval decrease in the size of the left pleural effusion post thoracentesis. No pneumothorax. Left lung base atelectasis. Small right pleural effusion is noted. Stable cardiac silhouette. No acute osseous pathology. IMPRESSION: Interval decrease in the size of the left pleural effusion post thoracentesis. No pneumothorax. Electronically  Signed   By: Vanetta Chou M.D.   On: 08/22/2024 13:21   DG Chest 2 View Result Date: 08/21/2024 CLINICAL DATA:  Shortness of breath.  Pleural effusions. EXAM: CHEST - 2 VIEW COMPARISON:  08/09/2024. FINDINGS: Persistent moderate left and small right pleural effusions with associated bibasilar opacities, favored to reflect atelectasis and/or infiltrate. No overt pulmonary edema. No pneumothorax. Cardiac silhouette is unchanged. Diffuse osseous mineralization. Scoliotic curvature of the spine. No acute osseous abnormality. IMPRESSION: Persistent moderate left and small right pleural effusions with associated bibasilar opacities, favored to reflect atelectasis and/or infiltrate. Electronically Signed   By: Harrietta Sherry M.D.   On: 08/21/2024 13:17   DG Chest 2 View Result Date: 08/09/2024 EXAM: 2 VIEW(S) XRAY OF THE CHEST 08/09/2024 09:37:00 AM COMPARISON: 8 days ago. CLINICAL HISTORY: shortness of breath FINDINGS: LUNGS AND PLEURA: Small bilateral pleural effusions are noted with adjacent subsegmental atelectasis. No pulmonary edema. No pneumothorax. HEART AND MEDIASTINUM: No acute abnormality of the cardiac and mediastinal silhouettes. BONES AND SOFT TISSUES: Moderate dextroscoliosis of the thoracic spine is noted. IMPRESSION: 1. Small bilateral pleural effusions with adjacent subsegmental atelectasis. 2. Moderate dextroscoliosis of the thoracic spine. Electronically signed by: Lynwood Seip MD 08/09/2024 10:34 AM EST RP Workstation: [id number]   DG Chest 1 View Result Date: 08/01/2024 CLINICAL DATA:  Status post left-sided thoracentesis. EXAM: DG CHEST 1V COMPARISON:  Chest radiograph dated 05/19/2023. FINDINGS: Small right pleural effusion and right lung base atelectasis or infiltrate. Linear left lung base atelectasis/scarring. No pneumothorax. The cardiac silhouette is within normal limits. Osteopenia with degenerative changes spine and scoliosis. No acute osseous  pathology. IMPRESSION: Small  right pleural effusion and right lung base atelectasis or infiltrate. No pneumothorax. Electronically Signed   By: Vanetta Chou M.D.   On: 08/01/2024 15:27   US  THORACENTESIS ASP PLEURAL SPACE W/IMG GUIDE Result Date: 08/01/2024 INDICATION: Patient with history of metastatic breast cancer, bilateral pleural effusions, left-greater-than-right. Request received for therapeutic left thoracentesis. EXAM: ULTRASOUND GUIDED THERAPEUTIC LEFT THORACENTESIS MEDICATIONS: 8 mL 1% lidocaine  with epinephrine  to skin/subcutaneous tissue COMPLICATIONS: None immediate. PROCEDURE: An ultrasound guided thoracentesis was thoroughly discussed with the patient and questions answered. The benefits, risks, alternatives and complications were also discussed. The patient understands and wishes to proceed with the procedure. Written consent was obtained. Ultrasound was performed to localize and mark an adequate pocket of fluid in the left chest. The area was then prepped and draped in the normal sterile fashion. 1% Lidocaine  was used for local anesthesia. Under ultrasound guidance a 6 Fr Safe-T-Centesis catheter was introduced. Thoracentesis was performed. The catheter was removed and a dressing applied. FINDINGS: A total of approximately 1.1 liters of hazy, yellow fluid was removed. IMPRESSION: Successful ultrasound guided therapeutic left thoracentesis yielding 1.1 liters of pleural fluid. Performed by: Franky Rakers, PA-C Electronically Signed   By: Juliene Balder M.D.   On: 08/01/2024 15:21   NM Bone Scan Whole Body Result Date: 08/01/2024 CLINICAL DATA:  Metastatic right breast cancer. EXAM: NUCLEAR MEDICINE WHOLE BODY BONE SCAN TECHNIQUE: Whole body anterior and posterior images were obtained approximately 3 hours after intravenous injection of radiopharmaceutical. RADIOPHARMACEUTICALS:  19.2 mCi Technetium-63m MDP IV COMPARISON:  CT scan 07/30/2024, PET-CT 02/24/2024 and whole-body bone scan 11/22/2023 FINDINGS: Stable areas of  degenerative type uptake noted mainly involving the spine, sternoclavicular joints and feet. No findings suspicious for osseous metastatic disease. IMPRESSION: Negative whole body bone scan for metastatic bone disease. No change since prior study. Electronically Signed   By: MYRTIS Stammer M.D.   On: 08/01/2024 11:25   CT CHEST ABDOMEN PELVIS W CONTRAST Result Date: 08/01/2024 CLINICAL DATA:  Metastatic disease evaluation. * Tracking Code: BO * EXAM: CT CHEST, ABDOMEN, AND PELVIS WITH CONTRAST TECHNIQUE: Multidetector CT imaging of the chest, abdomen and pelvis was performed following the standard protocol during bolus administration of intravenous contrast. RADIATION DOSE REDUCTION: This exam was performed according to the departmental dose-optimization program which includes automated exposure control, adjustment of the mA and/or kV according to patient size and/or use of iterative reconstruction technique. CONTRAST:  OMNIPAQUE  IOHEXOL  300 MG/ML  SOLN COMPARISON:  CT scan chest, abdomen and pelvis from 05/22/2024. FINDINGS: CT CHEST FINDINGS Cardiovascular: Normal cardiac size. No pericardial effusion. No aortic aneurysm. There are coronary artery calcifications, in keeping with coronary artery disease. There are also minimal peripheral atherosclerotic vascular calcifications of thoracic aorta and its major branches. Mediastinum/Nodes: Visualized thyroid  gland appears grossly unremarkable. There is new irregular soft tissue in the anterosuperior mediastinum, highly concerning for metastatic lymphadenopathy. There is interval increase in the size and number of lower cervical lymph nodes for example, left cervical lymph node measuring up to 1.5 x 1.8 cm (series 6, image 22). There is also significant interval increase in the paratracheal, prevascular, subcarinal and left hilar lymph nodes, compatible with metastasis. There is also new, heterogeneous irregular mass predominantly centered in the right-sided  latissimus dorsi muscle, which reaches up to the intercostal muscles and is compatible with metastasis. The mass measures at least 4.0 x 9.0 cm orthogonally on axial plane. Craniocaudally the mass measures up to 12.5 cm. There is associated asymmetric  irregular thickening of the right anterolateral chest wall skin. There is also interval increase in the size in number of left axillary lymph nodes. Lungs/Pleura: The central tracheo-bronchial tree is patent. There is new small right and moderate left pleural effusion with associated partial collapse of the left lung lower lobe. 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 No solid mass or pneumothorax. No suspicious lung nodules. Musculoskeletal: No suspicious osseous lesions. Marked scoliotic curvature of the spine. There are mild-to-moderate degenerative changes of the thoracic spine. CT ABDOMEN PELVIS FINDINGS Hepatobiliary: The liver is normal in size. Non-cirrhotic configuration. Redemonstration of subcapsular bilobed simple cyst in the left hepatic lobe, segment 3 measuring 1.9 x 2.6 cm. There is an additional approximately 10 x 11 mm hypoattenuating lesion in the left hepatic lobe, segment 3/4B with peripheral hyperattenuating wall this is incompletely characterized on the current exam but appears grossly similar to the prior studies. No new suspicious liver lesions seen. No intrahepatic or extrahepatic bile duct dilation. No calcified gallstones. Normal gallbladder wall thickness. No pericholecystic inflammatory changes. Pancreas: Unremarkable. No pancreatic ductal dilatation or surrounding inflammatory changes. Spleen: Within normal limits. No focal lesion. Adrenals/Urinary Tract: Adrenal glands are unremarkable. No suspicious renal mass. There is a stable 5 x 6 mm nonobstructing calculus  in the left kidney lower pole calyx. No other nephroureterolithiasis or obstructive uropathy on either side. Urinary bladder is under distended, precluding optimal assessment. However, no large mass or stones identified. No perivesical fat stranding. Stomach/Bowel: No disproportionate dilation of the small or large bowel loops. No evidence of abnormal bowel wall thickening or inflammatory changes. The appendix was not visualized; however there is no acute inflammatory process in the right lower quadrant. There is moderate stool burden. Vascular/Lymphatic: No ascites or pneumoperitoneum. No abdominal or pelvic lymphadenopathy, by size criteria. No aneurysmal dilation of the major abdominal arteries. There are moderate peripheral atherosclerotic vascular calcifications of the aorta and its major branches. Reproductive: The uterus is unremarkable. No large adnexal mass. Other: There is a tiny fat containing umbilical hernia. The soft tissues and abdominal wall are otherwise unremarkable. Musculoskeletal: No suspicious osseous lesions. There are moderate multilevel degenerative changes in the visualized spine. Redemonstration of hemangioma in the L3 vertebral body. IMPRESSION: 1. There is new irregular soft tissue in the anterosuperior mediastinum, highly concerning for metastatic lymphadenopathy. There is also significant interval increase in the size and number of lower cervical, mediastinal, left hilar and left axillary lymph nodes, compatible with worsening metastases. 2. There is a new, large, heterogeneous, infiltrating irregular mass predominantly centered in the right-sided latissimus dorsi muscle, which reaches up to the intercostal muscles and is compatible with worsening metastases. 3. 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 4. There  is new small right and moderate left pleural effusion with associated partial collapse of the left lung lower lobe. 5. No metastatic disease identified within the abdomen or pelvis. 6. Multiple other nonacute observations, as described above. Aortic Atherosclerosis (ICD10-I70.0). Electronically Signed   By: Ree Molt M.D.   On: 08/01/2024 11:15      Subjective: Patient seen and examined at bedside.  Feels better and wants to go home today.  No fever, vomiting, worsening abdominal pain reported.  Discharge Exam: Vitals:   08/29/24 0703  08/29/24 0806  BP: 109/83   Pulse: 89   Resp: 15   Temp: 98.9 F (37.2 C)   SpO2: 97% 94%    General: Pt is alert, awake, not in acute distress.  On room air. Cardiovascular: rate controlled, S1/S2 + Respiratory: bilateral decreased breath sounds at bases, left PleurX catheter present, Abdominal: Soft, NT, ND, bowel sounds + Extremities: no edema, no cyanosis    The results of significant diagnostics from this hospitalization (including imaging, microbiology, ancillary and laboratory) are listed below for reference.     Microbiology: Recent Results (from the past 240 hours)  MRSA Next Gen by PCR, Nasal     Status: None   Collection Time: 08/25/24  7:22 PM   Specimen: Nasal Mucosa; Nasal Swab  Result Value Ref Range Status   MRSA by PCR Next Gen NOT DETECTED NOT DETECTED Final    Comment: (NOTE) The GeneXpert MRSA Assay (FDA approved for NASAL specimens only), is one component of a comprehensive MRSA colonization surveillance program. It is not intended to diagnose MRSA infection nor to guide or monitor treatment for MRSA infections. Test performance is not FDA approved in patients less than 5 years old. Performed at [facility], [address], [place], [place] [address]   Body fluid culture w Gram Stain     Status: None (Preliminary result)   Collection Time: 08/28/24  5:45 PM   Specimen: Pleural Fluid  Result  Value Ref Range Status   Specimen Description   Final    PLEURAL Performed at [facility], [address], [place], [place] [address]    Special Requests   Final    NONE Performed at [facility], [address] [place], [place] [address]    Gram Stain NO WBC SEEN NO ORGANISMS SEEN   Final   Culture   Final    NO GROWTH < 12 HOURS Performed at [facility], [address] [place], [place] [address]    Report Status PENDING  Incomplete     Labs: BNP (last 3 results) No results for input(s): BNP in the last 8760 hours. Basic Metabolic Panel: Recent Labs  Lab 08/25/24 1210 08/25/24 1515 08/25/24 1534 08/25/24 2021 08/26/24 0435 08/27/24 0310  NA 137  --  136 138 138 136  K 4.7  --  5.8* 3.8 4.0 3.6  CL 104  --  111 108 109 108  CO2 19*  --   --  19* 20* 19*  GLUCOSE 109*  --  95 99 115* 94  BUN 30*  --  38* 25* 26* 21  CREATININE 1.00  --  0.70 0.68 0.81 0.69  CALCIUM 9.2  --   --  7.9* 8.0* 8.0*  MG  --  2.4  --   --   --   --   PHOS  --  3.1  --   --   --   --    Liver Function Tests: Recent Labs  Lab 08/25/24 2021  AST 23  ALT 10  ALKPHOS 71  BILITOT 0.4  PROT 5.7*  ALBUMIN 3.1*   No results for input(s): LIPASE, AMYLASE in the last 168 hours. No results for input(s): AMMONIA in the last 168 hours. CBC: Recent Labs  Lab 08/25/24 1515 08/25/24 1534 08/26/24 0435 08/27/24 0310 08/28/24 0440 08/29/24 0556  WBC 5.4  --  4.8 5.1 5.4 6.3  HGB 11.7* 11.9* 10.1* 9.6* 9.1* 9.2*  HCT 35.3* 35.0*  30.9* 30.0* 28.8* 28.9*  MCV 97.2  --  98.7 100.3* 101.1* 101.4*  PLT 507*  --  507* 435* 445* 409*   Cardiac Enzymes: No results for input(s): CKTOTAL, CKMB, CKMBINDEX, TROPONINI in the last 168 hours. BNP: Invalid input(s): POCBNP CBG: No results for input(s): GLUCAP in the last 168 hours. D-Dimer No results for input(s): DDIMER in the last 72 hours. Hgb A1c No results for input(s):  HGBA1C in the last 72 hours. Lipid Profile No results for input(s): CHOL, HDL, LDLCALC, TRIG, CHOLHDL, LDLDIRECT in the last 72 hours. Thyroid  function studies No results for input(s): TSH, T4TOTAL, T3FREE, THYROIDAB in the last 72 hours.  Invalid input(s): FREET3 Anemia work up No results for input(s): VITAMINB12, FOLATE, FERRITIN, TIBC, IRON, RETICCTPCT in the last 72 hours. Urinalysis No results found for: COLORURINE, APPEARANCEUR, LABSPEC, PHURINE, GLUCOSEU, HGBUR, BILIRUBINUR, KETONESUR, PROTEINUR, UROBILINOGEN, NITRITE, LEUKOCYTESUR Sepsis Labs Recent Labs  Lab 08/26/24 0435 08/27/24 0310 08/28/24 0440 08/29/24 0556  WBC 4.8 5.1 5.4 6.3   Microbiology Recent Results (from the past 240 hours)  MRSA Next Gen by PCR, Nasal     Status: None   Collection Time: 08/25/24  7:22 PM   Specimen: Nasal Mucosa; Nasal Swab  Result Value Ref Range Status   MRSA by PCR Next Gen NOT DETECTED NOT DETECTED Final    Comment: (NOTE) The GeneXpert MRSA Assay (FDA approved for NASAL specimens only), is one component of a comprehensive MRSA colonization surveillance program. It is not intended to diagnose MRSA infection nor to guide or monitor treatment for MRSA infections. Test performance is not FDA approved in patients less than 6 years old. Performed at [facility], [address], [place], [place] [address]   Body fluid culture w Gram Stain     Status: None (Preliminary result)   Collection Time: 08/28/24  5:45 PM   Specimen: Pleural Fluid  Result Value Ref Range Status   Specimen Description   Final    PLEURAL Performed at [facility] [address] [place], [place] [address]    Special Requests   Final    NONE Performed at [facility], [address], [place], [place] [address]    Gram Stain NO WBC SEEN NO ORGANISMS SEEN   Final   Culture   Final    NO  GROWTH < 12 HOURS Performed at [facility], [address] [place], [place] [address]    Report Status PENDING  Incomplete     Time coordinating discharge: 35 minutes  SIGNED:   Sophie Mao, MD  [facility] 08/29/2024, 10:47 AM

## 2024-08-29 NOTE — Progress Notes (Signed)
 Patient ID: Caitlin [name], female   DOB: March 14, 1956, 68 y.o.   MRN: [id number] Pt doing okay this a.m, status post left PleurX catheter placement yesterday with 1.2 L removed.  No acute changes.  Has some mild soreness at left PleurX insertion site.  Vital signs stable, afebrile; WBC normal, hemoglobin stable, chest x-ray this morning with stable positioning of left PleurX, no measurable pneumothorax, interval reduction in left pleural effusion, patchy atelectasis or airspace disease in lower lung fields, cardiomegaly with some increased central vascular prominence and development of mild interstitial edema; left PleurX catheter intact, bandage clean and dry.  Drain effusion via pleurx as needed.  Further plans as per primary/pulmonology/oncology.

## 2024-08-31 DIAGNOSIS — Z48812 Encounter for surgical aftercare following surgery on the circulatory system: Secondary | ICD-10-CM | POA: Diagnosis not present

## 2024-08-31 DIAGNOSIS — Z7951 Long term (current) use of inhaled steroids: Secondary | ICD-10-CM | POA: Diagnosis not present

## 2024-08-31 DIAGNOSIS — Z87442 Personal history of urinary calculi: Secondary | ICD-10-CM | POA: Diagnosis not present

## 2024-08-31 DIAGNOSIS — J4540 Moderate persistent asthma, uncomplicated: Secondary | ICD-10-CM | POA: Diagnosis not present

## 2024-08-31 DIAGNOSIS — Z9011 Acquired absence of right breast and nipple: Secondary | ICD-10-CM | POA: Diagnosis not present

## 2024-08-31 DIAGNOSIS — C50911 Malignant neoplasm of unspecified site of right female breast: Secondary | ICD-10-CM | POA: Diagnosis not present

## 2024-08-31 DIAGNOSIS — J90 Pleural effusion, not elsewhere classified: Secondary | ICD-10-CM | POA: Diagnosis not present

## 2024-08-31 DIAGNOSIS — Z8701 Personal history of pneumonia (recurrent): Secondary | ICD-10-CM | POA: Diagnosis not present

## 2024-08-31 DIAGNOSIS — Z7901 Long term (current) use of anticoagulants: Secondary | ICD-10-CM | POA: Diagnosis not present

## 2024-08-31 DIAGNOSIS — I480 Paroxysmal atrial fibrillation: Secondary | ICD-10-CM | POA: Diagnosis not present

## 2024-08-31 DIAGNOSIS — Z9181 History of falling: Secondary | ICD-10-CM | POA: Diagnosis not present

## 2024-08-31 DIAGNOSIS — E039 Hypothyroidism, unspecified: Secondary | ICD-10-CM | POA: Diagnosis not present

## 2024-08-31 DIAGNOSIS — I2699 Other pulmonary embolism without acute cor pulmonale: Secondary | ICD-10-CM | POA: Diagnosis not present

## 2024-08-31 DIAGNOSIS — M419 Scoliosis, unspecified: Secondary | ICD-10-CM | POA: Diagnosis not present

## 2024-08-31 DIAGNOSIS — J9601 Acute respiratory failure with hypoxia: Secondary | ICD-10-CM | POA: Diagnosis not present

## 2024-08-31 LAB — BODY FLUID CULTURE W GRAM STAIN
Culture: NO GROWTH
Gram Stain: NONE SEEN

## 2024-09-03 DIAGNOSIS — I2699 Other pulmonary embolism without acute cor pulmonale: Secondary | ICD-10-CM | POA: Diagnosis not present

## 2024-09-03 DIAGNOSIS — R7989 Other specified abnormal findings of blood chemistry: Secondary | ICD-10-CM | POA: Diagnosis not present

## 2024-09-03 DIAGNOSIS — I480 Paroxysmal atrial fibrillation: Secondary | ICD-10-CM | POA: Diagnosis not present

## 2024-09-03 DIAGNOSIS — Z87891 Personal history of nicotine dependence: Secondary | ICD-10-CM | POA: Diagnosis not present

## 2024-09-03 DIAGNOSIS — R000 Tachycardia, unspecified: Secondary | ICD-10-CM | POA: Diagnosis not present

## 2024-09-03 DIAGNOSIS — R0989 Other specified symptoms and signs involving the circulatory and respiratory systems: Secondary | ICD-10-CM | POA: Diagnosis not present

## 2024-09-03 DIAGNOSIS — I499 Cardiac arrhythmia, unspecified: Secondary | ICD-10-CM | POA: Diagnosis not present

## 2024-09-03 DIAGNOSIS — Z79899 Other long term (current) drug therapy: Secondary | ICD-10-CM | POA: Diagnosis not present

## 2024-09-03 DIAGNOSIS — Z79890 Hormone replacement therapy (postmenopausal): Secondary | ICD-10-CM | POA: Diagnosis not present

## 2024-09-03 DIAGNOSIS — J90 Pleural effusion, not elsewhere classified: Secondary | ICD-10-CM | POA: Diagnosis not present

## 2024-09-03 DIAGNOSIS — J910 Malignant pleural effusion: Secondary | ICD-10-CM | POA: Diagnosis not present

## 2024-09-03 DIAGNOSIS — Z86711 Personal history of pulmonary embolism: Secondary | ICD-10-CM | POA: Diagnosis not present

## 2024-09-03 DIAGNOSIS — Z7951 Long term (current) use of inhaled steroids: Secondary | ICD-10-CM | POA: Diagnosis not present

## 2024-09-03 DIAGNOSIS — Z7901 Long term (current) use of anticoagulants: Secondary | ICD-10-CM | POA: Diagnosis not present

## 2024-09-03 DIAGNOSIS — Z880 Allergy status to penicillin: Secondary | ICD-10-CM | POA: Diagnosis not present

## 2024-09-03 DIAGNOSIS — R918 Other nonspecific abnormal finding of lung field: Secondary | ICD-10-CM | POA: Diagnosis not present

## 2024-09-03 DIAGNOSIS — C50411 Malignant neoplasm of upper-outer quadrant of right female breast: Secondary | ICD-10-CM | POA: Diagnosis not present

## 2024-09-03 DIAGNOSIS — I471 Supraventricular tachycardia, unspecified: Secondary | ICD-10-CM | POA: Diagnosis not present

## 2024-09-03 DIAGNOSIS — J45909 Unspecified asthma, uncomplicated: Secondary | ICD-10-CM | POA: Diagnosis not present

## 2024-09-03 NOTE — ED Triage Notes (Signed)
 BIB [facility] from home.  Pt was at home getting ready for PT and felt light headed.  Pts husband is MD, given Cardizem  120 mg dose and several maneuvers (baring down, blowing through straw, etc) with no success.

## 2024-09-03 NOTE — ED Notes (Signed)
 Pt converted to  SR-ST with first dose of Adenosine 6 mg IVP.

## 2024-09-05 ENCOUNTER — Inpatient Hospital Stay: Payer: Self-pay | Attending: Hematology and Oncology | Admitting: Hematology and Oncology

## 2024-09-05 VITALS — BP 88/60 | HR 103 | Temp 98.0°F | Resp 18 | Ht 63.0 in | Wt 139.5 lb

## 2024-09-05 DIAGNOSIS — C773 Secondary and unspecified malignant neoplasm of axilla and upper limb lymph nodes: Secondary | ICD-10-CM | POA: Diagnosis present

## 2024-09-05 DIAGNOSIS — Z5112 Encounter for antineoplastic immunotherapy: Secondary | ICD-10-CM | POA: Insufficient documentation

## 2024-09-05 DIAGNOSIS — C50411 Malignant neoplasm of upper-outer quadrant of right female breast: Secondary | ICD-10-CM | POA: Diagnosis not present

## 2024-09-05 DIAGNOSIS — Z79899 Other long term (current) drug therapy: Secondary | ICD-10-CM | POA: Diagnosis not present

## 2024-09-05 DIAGNOSIS — J90 Pleural effusion, not elsewhere classified: Secondary | ICD-10-CM | POA: Insufficient documentation

## 2024-09-05 DIAGNOSIS — Z170 Estrogen receptor positive status [ER+]: Secondary | ICD-10-CM | POA: Diagnosis not present

## 2024-09-05 MED ORDER — PROCHLORPERAZINE MALEATE 10 MG PO TABS: 10.0000 mg | ORAL_TABLET | Freq: Four times a day (QID) | ORAL | 6 refills | Status: DC | PRN

## 2024-09-05 NOTE — Progress Notes (Signed)
 Patient Care Team: Toribio Jerel MATSU, MD as PCP - General (Unknown Physician Specialty) Tyree Nanetta SAILOR, RN as Oncology Nurse Navigator Odean Potts, MD as Consulting Physician (Hematology and Oncology) Shannon Agent, MD as Consulting Physician (Radiation Oncology) Ebbie Cough, MD as Consulting Physician (General Surgery)  DIAGNOSIS:  Encounter Diagnosis  Name Primary?   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female, estrogen receptor positive (HCC) Yes    SUMMARY OF ONCOLOGIC HISTORY: Oncology History  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female, estrogen receptor positive (HCC)  09/15/2022 Initial Diagnosis   Screening mammogram detected right breast mass which measured 1.4 cm by ultrasound at 10 o'clock position, biopsy revealed grade 3 IDC with lymphovascular invasion ER 10%, PR 0%, Ki-67 30%, HER2 0 negative   09/25/2022 Cancer Staging   Staging form: Breast, AJCC 8th Edition - Clinical: Stage IB (cT1c, cN0, cM0, G3, ER+, PR-, HER2-) - Signed by Odean Potts, MD on 09/25/2022 Histologic grading system: 3 grade system    Genetic Testing   [facility] CancerNext-Expanded Panel+RNA was Negative. Report date is 10/06/2021.  The CancerNext-Expanded gene panel offered by [facility] and includes sequencing, rearrangement, and RNA analysis for the following 77 genes: AIP, ALK, APC, ATM, AXIN2, BAP1, BARD1, BLM, BMPR1A, BRCA1, BRCA2, BRIP1, CDC73, CDH1, CDK4, CDKN1B, CDKN2A, CHEK2, CTNNA1, DICER1, FANCC, FH, FLCN, GALNT12, KIF1B, LZTR1, MAX, MEN1, MET, MLH1, MSH2, MSH3, MSH6, MUTYH, NBN, NF1, NF2, NTHL1, PALB2, PHOX2B, PMS2, POT1, PRKAR1A, PTCH1, PTEN, RAD51C, RAD51D, RB1, RECQL, RET, SDHA, SDHAF2, SDHB, SDHC, SDHD, SMAD4, SMARCA4, SMARCB1, SMARCE1, STK11, SUFU, TMEM127, TP53, TSC1, TSC2, VHL and XRCC2 (sequencing and deletion/duplication); EGFR, EGLN1, HOXB13, KIT, MITF, PDGFRA, POLD1, and POLE (sequencing only); EPCAM and GREM1  (deletion/duplication only).    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 with high-grade DCIS, inferior margin positive, angiolymphatic invasion present, 5/5 lymph nodes positive, additional medial margin: Poorly differentiated carcinoma with lymphoid stroma, additional inferior margin: Positive, ER 10% weak, PR 0%, HER2 0, Ki-67 30%   10/21/2022 Cancer Staging   Staging form: Breast, AJCC 8th Edition - Pathologic: Stage IIIC (pT1c, pN2a, cM0, G3, ER-, PR-, HER2-) - Signed by Odean Potts, MD on 10/21/2022 Stage prefix: Initial diagnosis Histologic grading system: 3 grade system   11/08/2022 Surgery   Margin reexcision: Inferior margin: Grade 3 IDC, new margin positive Medial margin: Grade 3 IDC new margin widely involved   11/18/2022 Surgery   Right inferior margin reexcision: Invasive poorly differentiated adenocarcinoma grade 3 present in new inferior margin.  Medial margin reexcision: Invasive poorly differentiated adenocarcinoma grade 3 present at new medial margin, posterior margin: Poorly differentiated adenocarcinoma grade 3, margin negative  11/08/2022 and 11/18/2022: Final margin focal positivity. After much discussion the plan is to finish her systemic treatment and come back at a later point to do mastectomy.    12/02/2022 - 04/18/2023 Chemotherapy   Patient is on Treatment Plan : BREAST ADJUVANT DOSE DENSE AC q14d / PACLitaxel  q7d     05/03/2023 Surgery   Right mastectomy: 8 cm grade 3 IDC with extensive lymphatic and vascular invasion RCB class III, deep margin multifocally positive, 5/5 lymph nodes positive, ER weak 10%, PR 0%, HER2 0, Ki-67 30%   05/25/2023 - 08/17/2023 Chemotherapy   Patient is on Treatment Plan : BREAST Pembrolizumab  (200) q21d x 24 months     05/25/2023 -  Adjuvant Chemotherapy   Capectiabine 1000mg  po BID 2 weeks  on, 1 week off; began 05/25/2023, second cycle delayed to  07/06/2023   06/08/2023 - 07/21/2023 Radiation Therapy   First Treatment Date: 2023-06-08 - Last Treatment Date: 2023-07-21   Plan Name: CW_R_BO Site: Chest Wall, Right Technique: 3D Mode: Photon Dose Per Fraction: 2 Gy Prescribed Dose (Delivered / Prescribed): 26 Gy / 26 Gy Prescribed Fxs (Delivered / Prescribed): 13 / 13   Plan Name: CW_R_SCV_PAB Site: Chest Wall, Right Technique: 3D Mode: Photon Dose Per Fraction: 2 Gy Prescribed Dose (Delivered / Prescribed): 50 Gy / 50 Gy Prescribed Fxs (Delivered / Prescribed): 25 / 25   Plan Name: CW_R_Bst_BO Site: Chest Wall, Right Technique: Electron Mode: Electron Dose Per Fraction: 2 Gy Prescribed Dose (Delivered / Prescribed): 14 Gy / 14 Gy Prescribed Fxs (Delivered / Prescribed): 7 / 7   Plan Name: CW_R Site: Chest Wall, Right Technique: 3D Mode: Photon Dose Per Fraction: 2 Gy Prescribed Dose (Delivered / Prescribed): 24 Gy / 24 Gy Prescribed Fxs (Delivered / Prescribed): 12 / 12   08/09/2024 -  Chemotherapy   Patient is on Treatment Plan : BREAST Fam-Trastuzumab Deruxtecan-nxki  (Enhertu ) (5.4) q21d       CHIEF COMPLIANT: Follow-up after recent hospitalization to discuss her treatment plan  HISTORY OF PRESENT ILLNESS:   History of Present Illness Caitlin [name] is a 68 year old female with supraventricular tachycardia who presents with recent episodes of SVT and associated symptoms.  She had an SVT episode on Monday morning during physical therapy, with heart rate 170 bpm for about 2 hours before ER arrival. Vagal maneuvers and an extra dose of diltiazem  were attempted at home, but her blood pressure was low and EMS transport was required. Troponin peaked at 47 and she was monitored overnight, then discharged the next day. She felt fatigued afterward but was improved by this visit.  She has shortness of breath that improved somewhat after fluid was drawn off on Saturday and Sunday. Her oxygen saturation has stayed at or  above 88% with walking. She is pursuing a home mini oxygen concentrator for comfort.  In the ER her TSH was 50. She takes Synthroid  and her dose was increased from 75 mcg to 100 mcg.  She has pain and swelling that recur if she misses oxycodone . She requested an oxycodone  refill today. Recent labs showed hemoglobin 10.5 and white blood cell count 6,000. She has episodes of sweating without documented fever.  A recent chest x-ray showed a 4 cm mass. Recent imaging also showed clear fluid. Platelets were elevated at 626.  She is in the process of moving to a new house and has an HSA to cover medical expenses.     ALLERGIES:  is allergic to penicillins.  MEDICATIONS:  Current Outpatient Medications  Medication Sig Dispense Refill   albuterol  (VENTOLIN  HFA) 108 (90 Base) MCG/ACT inhaler Inhale 2 puffs into the lungs every 4 (four) hours as needed for shortness of breath (Asthma).     alendronate  (FOSAMAX ) 70 MG tablet Take 1 tablet (70 mg total) by mouth once a week. Take with a full glass of water  on an empty stomach. 12 tablet 3   APIXABAN  (ELIQUIS ) VTE STARTER PACK (10MG  AND 5MG ) Take as directed on package: start with two-5mg  tablets twice daily for 7 days. On day 8, switch to one-5mg  tablet twice daily. 74 each 0   cetirizine (ZYRTEC) 10 MG tablet Take 10 mg by mouth daily.     dexamethasone  (DECADRON ) 4 MG tablet Take 1 tablets  by  mouth daily for 3 days starting the day after chemotherapy. Take with food. 30 tablet 1   diltiazem  (CARDIZEM  CD) 120 MG 24 hr capsule Take 120 mg by mouth daily.     levothyroxine  (SYNTHROID ) 75 MCG tablet Take 75 mcg by mouth daily before breakfast.     lidocaine -prilocaine  (EMLA ) cream Apply to affected area once 30 g 3   Multiple Vitamins-Minerals (MULTIVITAMIN WITH MINERALS) tablet Take 1 tablet by mouth daily.     ondansetron  (ZOFRAN ) 8 MG tablet Take 1 tablet (8 mg total) by mouth every 8 (eight) hours as needed for nausea or vomiting. Start on the third  day after chemotherapy. 30 tablet 1   OVER THE COUNTER MEDICATION Take 1 capsule by mouth 2 (two) times daily. copaiba softgels     oxyCODONE  (OXY IR/ROXICODONE ) 5 MG immediate release tablet Take 1 tablet (5 mg total) by mouth every 6 (six) hours as needed for severe pain (pain score 7-10). 60 tablet 0   polyethylene glycol (MIRALAX  / GLYCOLAX ) 17 g packet Take 17 g by mouth daily as needed for moderate constipation. 14 each 0   PULMICORT  FLEXHALER 180 MCG/ACT inhaler Inhale 1 puff into the lungs daily.     prochlorperazine  (COMPAZINE ) 10 MG tablet Take 1 tablet (10 mg total) by mouth every 6 (six) hours as needed for nausea or vomiting. 60 tablet 6   No current facility-administered medications for this visit.    PHYSICAL EXAMINATION: ECOG PERFORMANCE STATUS: 1 - Symptomatic but completely ambulatory  Vitals:   09/05/24 0926  BP: (!) 88/60  Pulse: (!) 103  Resp: 18  Temp: 98 F (36.7 C)  SpO2: 95%   Filed Weights   09/05/24 0926  Weight: 139 lb 8 oz (63.3 kg)      LABORATORY DATA:  I have reviewed the data as listed    Latest Ref Rng & Units 08/27/2024    3:10 AM 08/26/2024    4:35 AM 08/25/2024    8:21 PM  CMP  Glucose 70 - 99 mg/dL 94  884  99   BUN 8 - 23 mg/dL 21  26  25    Creatinine 0.44 - 1.00 mg/dL 9.30  9.18  9.31   Sodium 135 - 145 mmol/L 136  138  138   Potassium 3.5 - 5.1 mmol/L 3.6  4.0  3.8   Chloride 98 - 111 mmol/L 108  109  108   CO2 22 - 32 mmol/L 19  20  19    Calcium 8.9 - 10.3 mg/dL 8.0  8.0  7.9   Total Protein 6.5 - 8.1 g/dL   5.7   Total Bilirubin 0.0 - 1.2 mg/dL   0.4   Alkaline Phos 38 - 126 U/L   71   AST 15 - 41 U/L   23   ALT 0 - 44 U/L   10     Lab Results  Component Value Date   WBC 6.3 08/29/2024   HGB 9.2 (L) 08/29/2024   HCT 28.9 (L) 08/29/2024   MCV 101.4 (H) 08/29/2024   PLT 409 (H) 08/29/2024   NEUTROABS 3.0 08/20/2024    ASSESSMENT & PLAN:  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female, estrogen  receptor positive (HCC) 10/14/2022:Right lumpectomy: 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  No distant metastatic disease. Bone scan 10/29/2022: No evidence of bone metastasis   11/08/2022 and 11/18/2022: Margin reexcision surgeries: Final margin focal positivity.  After much discussion the plan is to finish her systemic treatment and come back at a later point to do mastectomy.   Treatment summary: Adjuvant chemotherapy with dose dense Adriamycin  and Cytoxan  followed by Taxol  to be completed 05/02/2023, Keytruda  05/25/2023-08/17/2023 (discontinued due to immune mediated adverse effects: Skin rash) Mastectomy by Dr. Ebbie 05/03/2023:Right mastectomy: 8 cm grade 3 IDC with extensive lymphatic and vascular invasion RCB class III, deep margin multifocally positive, 5/5 lymph nodes positive, ER weak 10%, PR 0%, HER2 0, Ki-67 30%, total lymph nodes: 10/10 (prior lumpectomy she had 5 positive nodes) Adjuvant radiation to start 06/09/2023-07/21/23 Capecitabine  started 05/25/2023-11/25/2023 ------------------------------------------------------------------------------------------------------------------------------------------------ Guardant reveal 12/14/2023: CT DNA detected 2.7% 02/16/2024: CT CAP: Interval enlargement of left superior mediastinal lymph node 2 x 1.8 cm concerning for metastatic disease   Right axillary lymph node biopsy: Positive for metastatic ductal carcinoma ER 0%, PR 0%, HER2  0, Ki-67 50% Guardant 360 12/26/2023: T p53 present, TMB 5.69, MSI high not detected   07/30/2024: CT CAP: New irregular soft tissue mass anterior mediastinum, increased in size and number of lower cervical, mediastinal, left hilar and left axillary nodes.  Large infiltrating mass right latissimus dorsi muscle which is up to intercostal muscles, right upper lobe lymphangitic carcinomatosis changes, no bone metastasis  Hospitalization: 08/25/24-08/29/2024: massive PE S/P thrombectomy (SVT S/P cardioversion), Pleurex Hospitalization [facility]: 09/03/2024-09/04/2024: SVT S/P Adenosine and Diltiazem   Current treatment: Enhertu  started 08/09/2024 (cycle 2 scheduled for 12/5) If she is able to tolerate these treatments, and she is responding to the treatment, we may be able to place a port in the future. Echocardiogram 07/27/2024: EF 60 to 65%   Chemotoxicities: Profound nausea and vomiting that lasted a full week after her chemo.  I sent a renewal for Compazine  Generalized fatigue and weight loss Pleural effusions: With the PleurX catheter they are able to drain 500 cc every 2 to 3 days. Since it has been 3 weeks from starting her treatment, the pain has resumed.  She is hoping that the pain will get better after the next cycle. ------------------------------------- Assessment and Plan Assessment & Plan Metastatic estrogen receptor positive breast cancer Continued chemotherapy management. Labs show hemoglobin at 10.5, platelets at 626, indicating inflammation. CT scan shows 4 cm mass, fluid clear, suggesting inflammation not infection. - Scheduled chemotherapy for December 31st.  Malignant pleural effusion Contributing to respiratory symptoms with exertional oxygen saturation drop to 88%. - Purchased mini oxygen concentrator for home use.  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 Pain and swelling improve with treatment. Current supply sufficient, no refills available. - Continue current  oxycodone  regimen. - Advised to contact provider for refill before running out.  Chemotherapy-induced nausea and fatigue Nausea and fatigue ongoing. Managed with ondansetron  and prochlorperazine . Fatigue may be exacerbated by recent SVT. - Continue ondansetron  and prochlorperazine  as needed.  Paroxysmal supraventricular tachycardia Recent SVT episode with heart rate of 170 bpm. Low TSH suggests possible levothyroxine  over-replacement. Increased diltiazem  not tolerated due to hypotension. - Avoid increasing diltiazem  due to hypotension risk.  Hypothyroidism Low TSH suggests levothyroxine  over-replacement. Adjustments made to dosage. - Increased levothyroxine  dose from  75 mcg to 100 mcg. - Monitor TSH levels and adjust dosage as needed.  We will cancel blood work that has been scheduled for Friday.    No orders of the defined types were placed in this encounter.  The patient has a good understanding of the overall plan. she agrees with it. she will call with any problems that may develop before the next visit here.  I personally spent a total of 30 minutes in the care of the patient today including preparing to see the patient, getting/reviewing separately obtained history, performing a medically appropriate exam/evaluation, counseling and educating, placing orders, referring and communicating with other health care professionals, documenting clinical information in the EHR, independently interpreting results, communicating results, and coordinating care.   Viinay K Mark Benecke, MD 09/05/24

## 2024-09-05 NOTE — Assessment & Plan Note (Signed)
 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  No distant metastatic disease. Bone scan 10/29/2022: No evidence of bone metastasis   11/08/2022 and 11/18/2022: Margin reexcision surgeries: Final margin focal positivity.  After much discussion the plan is to finish her systemic treatment and come back at a later point to do mastectomy.   Treatment summary: Adjuvant chemotherapy with dose dense Adriamycin  and Cytoxan  followed by Taxol  to be completed 05/02/2023, Keytruda  05/25/2023-08/17/2023 (discontinued due to immune mediated adverse effects: Skin rash) Mastectomy by Dr. Ebbie 05/03/2023:Right mastectomy: 8 cm grade 3 IDC with extensive lymphatic and vascular invasion RCB class III, deep margin multifocally positive, 5/5 lymph nodes positive, ER weak 10%, PR 0%, HER2 0, Ki-67 30%, total lymph nodes: 10/10 (prior lumpectomy she had 5 positive nodes) Adjuvant radiation to start 06/09/2023-07/21/23 Capecitabine  started 05/25/2023-11/25/2023 ------------------------------------------------------------------------------------------------------------------------------------------------ Guardant reveal 12/14/2023: CT DNA detected 2.7% 02/16/2024: CT CAP: Interval enlargement of left superior mediastinal lymph node 2 x 1.8 cm concerning for metastatic disease   Right axillary lymph node biopsy: Positive for metastatic ductal carcinoma ER 0%, PR 0%, HER2 0, Ki-67 50% Guardant  360 12/26/2023: T p53 present, TMB 5.69, MSI high not detected   07/30/2024: CT CAP: New irregular soft tissue mass anterior mediastinum, increased in size and number of lower cervical, mediastinal, left hilar and left axillary nodes.  Large infiltrating mass right latissimus dorsi muscle which is up to intercostal muscles, right upper lobe lymphangitic carcinomatosis changes, no bone metastasis  Hospitalization: 08/25/24-08/29/2024: massive PE S/P thrombectomy (SVT S/P cardioversion), Pleurex Hospitalization [facility]: 09/03/2024-09/04/2024: SVT S/P Adenosine and Diltiazem   Current treatment: Enhertu  started 08/09/2024 (cycle 2 scheduled for 12/5) If she is able to tolerate these treatments, and she is responding to the treatment, we may be able to place a port in the future. Echocardiogram 07/27/2024: EF 60 to 65%   Chemotoxicities: Profound nausea and vomiting that lasted a full week after her chemo. Generalized fatigue and weight loss

## 2024-09-06 ENCOUNTER — Encounter: Payer: Self-pay | Admitting: Hematology and Oncology

## 2024-09-06 ENCOUNTER — Ambulatory Visit: Admitting: Hematology and Oncology

## 2024-09-06 LAB — CYTOLOGY - NON PAP

## 2024-09-06 MED FILL — Fosaprepitant Dimeglumine For IV Infusion 150 MG (Base Eq): INTRAVENOUS | Qty: 5 | Status: AC

## 2024-09-07 ENCOUNTER — Ambulatory Visit (HOSPITAL_COMMUNITY)
Admission: RE | Admit: 2024-09-07 | Discharge: 2024-09-07 | Disposition: A | Source: Ambulatory Visit | Attending: Radiology | Admitting: Radiology

## 2024-09-07 ENCOUNTER — Other Ambulatory Visit: Payer: Self-pay

## 2024-09-07 ENCOUNTER — Inpatient Hospital Stay

## 2024-09-07 ENCOUNTER — Other Ambulatory Visit: Payer: Self-pay | Admitting: *Deleted

## 2024-09-07 ENCOUNTER — Ambulatory Visit (HOSPITAL_COMMUNITY)
Admission: RE | Admit: 2024-09-07 | Discharge: 2024-09-07 | Disposition: A | Discharge status: Home / Self Care | Source: Ambulatory Visit | Attending: Hematology and Oncology | Admitting: Hematology and Oncology

## 2024-09-07 ENCOUNTER — Ambulatory Visit (HOSPITAL_COMMUNITY)

## 2024-09-07 ENCOUNTER — Encounter: Payer: Self-pay | Admitting: Hematology and Oncology

## 2024-09-07 VITALS — BP 122/76 | HR 100 | Temp 98.9°F | Resp 18 | Wt 140.5 lb

## 2024-09-07 DIAGNOSIS — J90 Pleural effusion, not elsewhere classified: Secondary | ICD-10-CM

## 2024-09-07 DIAGNOSIS — Z170 Estrogen receptor positive status [ER+]: Secondary | ICD-10-CM

## 2024-09-07 DIAGNOSIS — Z5112 Encounter for antineoplastic immunotherapy: Secondary | ICD-10-CM | POA: Diagnosis not present

## 2024-09-07 HISTORY — PX: IR THORACENTESIS ASP PLEURAL SPACE W/IMG GUIDE: IMG5380

## 2024-09-07 MED ORDER — DEXTROSE 5 % IV SOLN: INTRAVENOUS | Status: DC

## 2024-09-07 MED ORDER — DEXAMETHASONE SOD PHOSPHATE PF 10 MG/ML IJ SOLN
10.0000 mg | Freq: Once | INTRAMUSCULAR | Status: AC
  Administered 2024-09-07: 10 mg via INTRAVENOUS

## 2024-09-07 MED ORDER — PALONOSETRON HCL INJECTION 0.25 MG/5ML
0.2500 mg | Freq: Once | INTRAVENOUS | Status: AC
  Administered 2024-09-07: 0.25 mg via INTRAVENOUS
  Filled 2024-09-07: qty 5

## 2024-09-07 MED ORDER — SODIUM CHLORIDE 0.9 % IV SOLN
150.0000 mg | Freq: Once | INTRAVENOUS | Status: AC
  Administered 2024-09-07: 150 mg via INTRAVENOUS
  Filled 2024-09-07: qty 5

## 2024-09-07 MED ORDER — DIPHENHYDRAMINE HCL 25 MG PO CAPS
25.0000 mg | ORAL_CAPSULE | Freq: Once | ORAL | Status: AC
  Administered 2024-09-07: 25 mg via ORAL
  Filled 2024-09-07: qty 1

## 2024-09-07 MED ORDER — FAM-TRASTUZUMAB DERUXTECAN-NXKI CHEMO 100 MG IV SOLR
4.4000 mg/kg | Freq: Once | INTRAVENOUS | Status: AC
  Administered 2024-09-07: 300 mg via INTRAVENOUS
  Filled 2024-09-07: qty 15

## 2024-09-07 MED ORDER — ACETAMINOPHEN 325 MG PO TABS
650.0000 mg | ORAL_TABLET | Freq: Once | ORAL | Status: AC
  Administered 2024-09-07: 650 mg via ORAL
  Filled 2024-09-07: qty 2

## 2024-09-07 MED ORDER — OXYCODONE HCL 5 MG PO TABS
5.0000 mg | ORAL_TABLET | Freq: Once | ORAL | Status: AC
  Administered 2024-09-07: 5 mg via ORAL
  Filled 2024-09-07: qty 1

## 2024-09-07 MED ORDER — LIDOCAINE-EPINEPHRINE 1 %-1:100000 IJ SOLN
INTRAMUSCULAR | Status: AC
  Filled 2024-09-07: qty 1

## 2024-09-07 NOTE — Procedures (Signed)
 Ultrasound-guided  therapeutic right thoracentesis performed yielding 1.3 liters of hazy,amber fluid. No immediate complications. Follow-up chest x-ray pending. EBL < 2 cc. Consider right pleurx cath placement if pt remains symptomatic.

## 2024-09-07 NOTE — Progress Notes (Signed)
 Received mychart message from pt with complaint of ongoing shortness of breath despite pleurx catheter in left lung.  Pt requesting thoracentesis of right lung today to help with breathing.  Orders placed, appt scheduled, pt notified and verbalized understanding.

## 2024-09-07 NOTE — Patient Instructions (Addendum)

## 2024-09-12 ENCOUNTER — Encounter: Payer: Self-pay | Admitting: Hematology and Oncology

## 2024-09-14 ENCOUNTER — Other Ambulatory Visit: Payer: Self-pay

## 2024-09-20 ENCOUNTER — Inpatient Hospital Stay: Admitting: Adult Health

## 2024-09-20 ENCOUNTER — Inpatient Hospital Stay

## 2024-09-21 ENCOUNTER — Inpatient Hospital Stay (HOSPITAL_BASED_OUTPATIENT_CLINIC_OR_DEPARTMENT_OTHER): Admitting: Pulmonary Disease

## 2024-09-24 ENCOUNTER — Encounter: Payer: Self-pay | Admitting: Hematology and Oncology

## 2024-09-24 ENCOUNTER — Ambulatory Visit (HOSPITAL_COMMUNITY)
Admission: RE | Admit: 2024-09-24 | Discharge: 2024-09-24 | Disposition: A | Discharge status: Home / Self Care | Source: Ambulatory Visit | Attending: Hematology and Oncology | Admitting: Hematology and Oncology

## 2024-09-24 ENCOUNTER — Other Ambulatory Visit: Payer: Self-pay | Admitting: *Deleted

## 2024-09-24 DIAGNOSIS — C50411 Malignant neoplasm of upper-outer quadrant of right female breast: Secondary | ICD-10-CM | POA: Insufficient documentation

## 2024-09-24 DIAGNOSIS — Z170 Estrogen receptor positive status [ER+]: Secondary | ICD-10-CM | POA: Diagnosis present

## 2024-09-24 DIAGNOSIS — J90 Pleural effusion, not elsewhere classified: Secondary | ICD-10-CM | POA: Insufficient documentation

## 2024-09-25 ENCOUNTER — Inpatient Hospital Stay

## 2024-09-25 ENCOUNTER — Other Ambulatory Visit: Payer: Self-pay | Admitting: *Deleted

## 2024-09-25 ENCOUNTER — Inpatient Hospital Stay: Admitting: Hematology and Oncology

## 2024-09-25 DIAGNOSIS — J90 Pleural effusion, not elsewhere classified: Secondary | ICD-10-CM

## 2024-09-25 NOTE — Progress Notes (Signed)
 MD reviewed recent chest  xray showing moderate pleural effusion.  Verbal orders received and placed for thoracentesis.  Pt notified to contact central scheduling.

## 2024-09-26 ENCOUNTER — Ambulatory Visit (HOSPITAL_COMMUNITY)
Admission: RE | Admit: 2024-09-26 | Discharge: 2024-09-26 | Disposition: A | Source: Ambulatory Visit | Attending: Radiology | Admitting: Radiology

## 2024-09-26 ENCOUNTER — Other Ambulatory Visit (HOSPITAL_COMMUNITY): Payer: Self-pay

## 2024-09-26 ENCOUNTER — Encounter (HOSPITAL_COMMUNITY): Payer: Self-pay

## 2024-09-26 ENCOUNTER — Encounter: Payer: Self-pay | Admitting: Hematology and Oncology

## 2024-09-26 ENCOUNTER — Ambulatory Visit (HOSPITAL_COMMUNITY)
Admission: RE | Admit: 2024-09-26 | Discharge: 2024-09-26 | Disposition: A | Discharge status: Home / Self Care | Source: Ambulatory Visit | Attending: Hematology and Oncology | Admitting: Hematology and Oncology

## 2024-09-26 ENCOUNTER — Ambulatory Visit (HOSPITAL_COMMUNITY)

## 2024-09-26 ENCOUNTER — Other Ambulatory Visit: Payer: Self-pay | Admitting: Hematology and Oncology

## 2024-09-26 DIAGNOSIS — J90 Pleural effusion, not elsewhere classified: Secondary | ICD-10-CM | POA: Diagnosis present

## 2024-09-26 HISTORY — PX: IR THORACENTESIS ASP PLEURAL SPACE W/IMG GUIDE: IMG5380

## 2024-09-26 HISTORY — PX: IR RADIOLOGIST EVAL & MGMT: IMG5224

## 2024-09-26 MED ORDER — LIDOCAINE-EPINEPHRINE 1 %-1:100000 IJ SOLN
INTRAMUSCULAR | Status: AC
  Filled 2024-09-26: qty 20

## 2024-09-26 MED ORDER — LIDOCAINE-EPINEPHRINE 1 %-1:100000 IJ SOLN
20.0000 mL | Freq: Once | INTRAMUSCULAR | Status: AC
  Administered 2024-09-26: 8 mL via INTRADERMAL

## 2024-09-26 NOTE — Procedures (Signed)
 Ultrasound-guided  therapeutic right thoracentesis performed yielding 1.4 liters of hazy,amber fluid. No immediate complications. Follow-up chest x-ray pending. EBL none.  Due to persistent patient coughing only the above amount of fluid was removed today.  Limited ultrasound left posterior chest today reveals only minimal free pleural fluid.

## 2024-09-27 NOTE — Progress Notes (Deleted)
 [facility] OFFICE PROGRESS NOTE  Toribio Jerel MATSU, MD [address] [place] [address]  DIAGNOSIS:  Oncology History  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female, estrogen receptor positive (HCC)  09/15/2022 Initial Diagnosis   Screening mammogram detected right breast mass which measured 1.4 cm by ultrasound at 10 o'clock position, biopsy revealed grade 3 IDC with lymphovascular invasion ER 10%, PR 0%, Ki-67 30%, HER2 0 negative   09/25/2022 Cancer Staging   Staging form: Breast, AJCC 8th Edition - Clinical: Stage IB (cT1c, cN0, cM0, G3, ER+, PR-, HER2-) - Signed by Odean Potts, MD on 09/25/2022 Histologic grading system: 3 grade system    Genetic Testing   [facility] CancerNext-Expanded Panel+RNA was Negative. Report date is 10/06/2021.  The CancerNext-Expanded gene panel offered by [facility] and includes sequencing, rearrangement, and RNA analysis for the following 77 genes: AIP, ALK, APC, ATM, AXIN2, BAP1, BARD1, BLM, BMPR1A, BRCA1, BRCA2, BRIP1, CDC73, CDH1, CDK4, CDKN1B, CDKN2A, CHEK2, CTNNA1, DICER1, FANCC, FH, FLCN, GALNT12, KIF1B, LZTR1, MAX, MEN1, MET, MLH1, MSH2, MSH3, MSH6, MUTYH, NBN, NF1, NF2, NTHL1, PALB2, PHOX2B, PMS2, POT1, PRKAR1A, PTCH1, PTEN, RAD51C, RAD51D, RB1, RECQL, RET, SDHA, SDHAF2, SDHB, SDHC, SDHD, SMAD4, SMARCA4, SMARCB1, SMARCE1, STK11, SUFU, TMEM127, TP53, TSC1, TSC2, VHL and XRCC2 (sequencing and deletion/duplication); EGFR, EGLN1, HOXB13, KIT, MITF, PDGFRA, POLD1, and POLE (sequencing only); EPCAM and GREM1 (deletion/duplication only).    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 with high-grade DCIS, inferior margin positive, angiolymphatic invasion present, 5/5 lymph nodes positive, additional medial margin: Poorly differentiated carcinoma with lymphoid stroma, additional inferior margin: Positive, ER 10% weak, PR 0%, HER2 0, Ki-67  30%   10/21/2022 Cancer Staging   Staging form: Breast, AJCC 8th Edition - Pathologic: Stage IIIC (pT1c, pN2a, cM0, G3, ER-, PR-, HER2-) - Signed by Odean Potts, MD on 10/21/2022 Stage prefix: Initial diagnosis Histologic grading system: 3 grade system   11/08/2022 Surgery   Margin reexcision: Inferior margin: Grade 3 IDC, new margin positive Medial margin: Grade 3 IDC new margin widely involved   11/18/2022 Surgery   Right inferior margin reexcision: Invasive poorly differentiated adenocarcinoma grade 3 present in new inferior margin.  Medial margin reexcision: Invasive poorly differentiated adenocarcinoma grade 3 present at new medial margin, posterior margin: Poorly differentiated adenocarcinoma grade 3, margin negative  11/08/2022 and 11/18/2022: Final margin focal positivity. After much discussion the plan is to finish her systemic treatment and come back at a later point to do mastectomy.    12/02/2022 - 04/18/2023 Chemotherapy   Patient is on Treatment Plan : BREAST ADJUVANT DOSE DENSE AC q14d / PACLitaxel  q7d     05/03/2023 Surgery   Right mastectomy: 8 cm grade 3 IDC with extensive lymphatic and vascular invasion RCB class III, deep margin multifocally positive, 5/5 lymph nodes positive, ER weak 10%, PR 0%, HER2 0, Ki-67 30%   05/25/2023 - 08/17/2023 Chemotherapy   Patient is on Treatment Plan : BREAST Pembrolizumab  (200) q21d x 24 months     05/25/2023 -  Adjuvant Chemotherapy   Capectiabine 1000mg  po BID 2 weeks on, 1 week off; began 05/25/2023, second cycle delayed to 07/06/2023   06/08/2023 - 07/21/2023 Radiation Therapy   First Treatment Date: 2023-06-08 - Last Treatment Date: 2023-07-21   Plan Name: CW_R_BO Site: Chest Wall, Right Technique: 3D Mode: Photon Dose Per Fraction: 2 Gy Prescribed Dose (Delivered / Prescribed): 26 Gy / 26 Gy Prescribed  Fxs (Delivered / Prescribed): 13 / 13   Plan Name: CW_R_SCV_PAB Site: Chest Wall, Right Technique: 3D Mode: Photon Dose Per  Fraction: 2 Gy Prescribed Dose (Delivered / Prescribed): 50 Gy / 50 Gy Prescribed Fxs (Delivered / Prescribed): 25 / 25   Plan Name: CW_R_Bst_BO Site: Chest Wall, Right Technique: Electron Mode: Electron Dose Per Fraction: 2 Gy Prescribed Dose (Delivered / Prescribed): 14 Gy / 14 Gy Prescribed Fxs (Delivered / Prescribed): 7 / 7   Plan Name: CW_R Site: Chest Wall, Right Technique: 3D Mode: Photon Dose Per Fraction: 2 Gy Prescribed Dose (Delivered / Prescribed): 24 Gy / 24 Gy Prescribed Fxs (Delivered / Prescribed): 12 / 12   08/09/2024 -  Chemotherapy   Patient is on Treatment Plan : BREAST Fam-Trastuzumab Deruxtecan-nxki  (Enhertu ) (5.4) q21d       CURRENT THERAPY: Enhertu  IV every 3 weeks. She is here for cycle #3 today.   INTERVAL HISTORY: Caitlin [name] 68 y.o. female returns to the clinic today for a follow up visit. The patient was last seen in the clinic by Dr. Odean on 09/05/24. At that point in time, they discussed her plan. The plan is to continue enhertu . She has malignant pleural effusions requiring thoracentesis PRN, the most recent on 12/24. They obtained 1.4 L of fluid. Her breathing and shortness of breath is ***. Her oxygen ***  She also has hypothyroidism and is on synthroid  *** mg.   ***Saw pulmonary medicine yesterday for the malignant effusion. They are planning on ***  She has a history of SVT for which she is currently on diltiazem .  Followed by cardiology? ***Not on diltiazem  due to hypotension???  Today she denies any fever, chills, or night sweats.  Appetite?  Cough?  Chest pain?  Hemoptysis?  She denies any nausea, vomiting, diarrhea, or constipation.  She denies any headache or visual changes.  Pain?  She takes oxycodone .  She denies any rashes or skin changes.  She is here today for evaluation and repeat blood work before undergoing cycle #3.     Gets thora, most recent 12/24 obtaining 1.4 L of fluid.   MEDICAL HISTORY: Past Medical History:   Diagnosis Date   Asthma    Breast cancer (HCC)    right   Complication of anesthesia    Dyspnea    Dysrhythmia    2012   Headache    History of kidney stones    passed   Hypothyroidism    Pneumonia 01/26/2023   PONV (postoperative nausea and vomiting)    rt breast ca 08/2022   Right Breast   Scoliosis    Thyroid  disease     ALLERGIES:  is allergic to penicillins.  MEDICATIONS:  Current Outpatient Medications  Medication Sig Dispense Refill   albuterol  (VENTOLIN  HFA) 108 (90 Base) MCG/ACT inhaler Inhale 2 puffs into the lungs every 4 (four) hours as needed for shortness of breath (Asthma).     alendronate  (FOSAMAX ) 70 MG tablet Take 1 tablet (70 mg total) by mouth once a week. Take with a full glass of water  on an empty stomach. 12 tablet 3   APIXABAN  (ELIQUIS ) VTE STARTER PACK (10MG  AND 5MG ) Take as directed on package: start with two-5mg  tablets twice daily for 7 days. On day 8, switch to one-5mg  tablet twice daily. 74 each 0   cetirizine (ZYRTEC) 10 MG tablet Take 10 mg by mouth daily.     dexamethasone  (DECADRON ) 4 MG tablet Take 1 tablets  by mouth daily for  3 days starting the day after chemotherapy. Take with food. 30 tablet 1   diltiazem  (CARDIZEM  CD) 120 MG 24 hr capsule Take 120 mg by mouth daily.     levothyroxine  (SYNTHROID ) 75 MCG tablet Take 75 mcg by mouth daily before breakfast.     lidocaine -prilocaine  (EMLA ) cream Apply to affected area once 30 g 3   Multiple Vitamins-Minerals (MULTIVITAMIN WITH MINERALS) tablet Take 1 tablet by mouth daily.     ondansetron  (ZOFRAN ) 8 MG tablet Take 1 tablet (8 mg total) by mouth every 8 (eight) hours as needed for nausea or vomiting. Start on the third day after chemotherapy. 30 tablet 1   OVER THE COUNTER MEDICATION Take 1 capsule by mouth 2 (two) times daily. copaiba softgels     oxyCODONE  (OXY IR/ROXICODONE ) 5 MG immediate release tablet Take 1 tablet (5 mg total) by mouth every 6 (six) hours as needed for severe pain (pain  score 7-10). 60 tablet 0   polyethylene glycol (MIRALAX  / GLYCOLAX ) 17 g packet Take 17 g by mouth daily as needed for moderate constipation. 14 each 0   prochlorperazine  (COMPAZINE ) 10 MG tablet Take 1 tablet (10 mg total) by mouth every 6 (six) hours as needed for nausea or vomiting. 60 tablet 6   PULMICORT  FLEXHALER 180 MCG/ACT inhaler Inhale 1 puff into the lungs daily.     No current facility-administered medications for this visit.    SURGICAL HISTORY:  Past Surgical History:  Procedure Laterality Date   APPENDECTOMY     AXILLARY SENTINEL NODE BIOPSY Right 10/14/2022   Procedure: RIGHT AXILLARY SENTINEL NODE BIOPSY;  Surgeon: Ebbie Cough, MD;  Location: [facility] OR;  Service: General;  Laterality: Right;   BREAST BIOPSY Right 09/15/2022   US  RT BREAST BX W LOC DEV 1ST LESION IMG BX SPEC US  GUIDE 09/15/2022 [facility]   BREAST BIOPSY  10/12/2022   US  RT RADIOACTIVE SEED LOC 10/12/2022 [facility]MO[facility]   BREAST LUMPECTOMY WITH RADIOACTIVE SEED AND SENTINEL LYMPH NODE BIOPSY Right 10/14/2022   Procedure: RIGHT BREAST LUMPECTOMY WITH RADIOACTIVE SEED;  Surgeon: Ebbie Cough, MD;  Location: [facility] OR;  Service: General;  Laterality: Right;   COLONOSCOPY     IR PERC PLEURAL DRAIN W/INDWELL CATH W/IMG GUIDE  08/28/2024   IR RADIOLOGIST EVAL & MGMT  09/26/2024   IR THORACENTESIS ASP PLEURAL SPACE W/IMG GUIDE  09/07/2024   IR THORACENTESIS ASP PLEURAL SPACE W/IMG GUIDE  09/26/2024   IR THROMBECT PRIM MECH INIT (INCLU) MOD SED  08/25/2024   IR TUNNELED CENTRAL VENOUS CATH PLC W IMG  08/25/2024   IR US  GUIDE VASC ACCESS LEFT  08/25/2024   IR US  GUIDE VASC ACCESS RIGHT  08/25/2024   LAPAROSCOPIC APPENDECTOMY     MASTECTOMY Right    PORT-A-CATH REMOVAL Left 05/03/2023   Procedure: REMOVAL PORT-A-CATH;  Surgeon: Ebbie Cough, MD;  Location: [facility] OR;  Service: General;  Laterality: Left;   PORT-A-CATH REMOVAL Left 12/22/2023   Procedure: REMOVAL PORT-A-CATH;  Surgeon:  Ebbie Cough, MD;  Location: [facility];  Service: General;  Laterality: Left;   PORTACATH PLACEMENT Left 10/14/2022   Procedure: INSERTION PORT-A-CATH;  Surgeon: Ebbie Cough, MD;  Location: [facility] OR;  Service: General;  Laterality: Left;   PORTACATH PLACEMENT Left 05/19/2023   Procedure: PORT PLACEMENT WITH ULTRASOUND GUIDANCE;  Surgeon: Ebbie Cough, MD;  Location: [facility];  Service: General;  Laterality: Left;   RE-EXCISION OF BREAST LUMPECTOMY Right 11/08/2022   Procedure: RE-EXCISION OF RIGHT BREAST  LUMPECTOMY;  Surgeon: Ebbie Cough, MD;  Location: [facility];  Service: General;  Laterality: Right;   RE-EXCISION OF BREAST LUMPECTOMY Right 11/18/2022   Procedure: RE-EXCISION OF RIGHT BREAST LUMPECTOMY;  Surgeon: Ebbie Cough, MD;  Location: [facility] OR;  Service: General;  Laterality: Right;   SIMPLE MASTECTOMY WITH AXILLARY SENTINEL NODE BIOPSY Right 05/03/2023   Procedure: RIGHT MASTECTOMY;  Surgeon: Ebbie Cough, MD;  Location: [facility] OR;  Service: General;  Laterality: Right;  GEN & PEC BLOCK   WISDOM TOOTH EXTRACTION      REVIEW OF SYSTEMS:   Review of Systems  Constitutional: Negative for appetite change, chills, fatigue, fever and unexpected weight change.  HENT:   Negative for mouth sores, nosebleeds, sore throat and trouble swallowing.   Eyes: Negative for eye problems and icterus.  Respiratory: Negative for cough, hemoptysis, shortness of breath and wheezing.   Cardiovascular: Negative for chest pain and leg swelling.  Gastrointestinal: Negative for abdominal pain, constipation, diarrhea, nausea and vomiting.  Genitourinary: Negative for bladder incontinence, difficulty urinating, dysuria, frequency and hematuria.   Musculoskeletal: Negative for back pain, gait problem, neck pain and neck stiffness.  Skin: Negative for itching and rash.  Neurological: Negative for dizziness, extremity weakness, gait  problem, headaches, light-headedness and seizures.  Hematological: Negative for adenopathy. Does not bruise/bleed easily.  Psychiatric/Behavioral: Negative for confusion, depression and sleep disturbance. The patient is not nervous/anxious.     PHYSICAL EXAMINATION:  There were no vitals taken for this visit.  ECOG PERFORMANCE STATUS: {CHL ONC ECOG [id number]  Physical Exam  Constitutional: Oriented to person, place, and time and well-developed, well-nourished, and in no distress. No distress.  HENT:  Head: Normocephalic and atraumatic.  Mouth/Throat: Oropharynx is clear and moist. No oropharyngeal exudate.  Eyes: Conjunctivae are normal. Right eye exhibits no discharge. Left eye exhibits no discharge. No scleral icterus.  Neck: Normal range of motion. Neck supple.  Cardiovascular: Normal rate, regular rhythm, normal heart sounds and intact distal pulses.   Pulmonary/Chest: Effort normal and breath sounds normal. No respiratory distress. No wheezes. No rales.  Abdominal: Soft. Bowel sounds are normal. Exhibits no distension and no mass. There is no tenderness.  Musculoskeletal: Normal range of motion. Exhibits no edema.  Lymphadenopathy:    No cervical adenopathy.  Neurological: Alert and oriented to person, place, and time. Exhibits normal muscle tone. Gait normal. Coordination normal.  Skin: Skin is warm and dry. No rash noted. Not diaphoretic. No erythema. No pallor.  Psychiatric: Mood, memory and judgment normal.  Vitals reviewed.  LABORATORY DATA: Lab Results  Component Value Date   WBC 6.3 08/29/2024   HGB 9.2 (L) 08/29/2024   HCT 28.9 (L) 08/29/2024   MCV 101.4 (H) 08/29/2024   PLT 409 (H) 08/29/2024      Chemistry      Component Value Date/Time   NA 136 08/27/2024 0310   K 3.6 08/27/2024 0310   CL 108 08/27/2024 0310   CO2 19 (L) 08/27/2024 0310   BUN 21 08/27/2024 0310   CREATININE 0.69 08/27/2024 0310   CREATININE 0.93 08/20/2024 0825      Component  Value Date/Time   CALCIUM 8.0 (L) 08/27/2024 0310   ALKPHOS 71 08/25/2024 2021   AST 23 08/25/2024 2021   AST 22 08/20/2024 0825   ALT 10 08/25/2024 2021   ALT 13 08/20/2024 0825   BILITOT 0.4 08/25/2024 2021   BILITOT 0.3 08/20/2024 0825       RADIOGRAPHIC STUDIES:  DG Chest 1 View  Result Date: 09/26/2024 CLINICAL DATA:  Status post right thoracentesis EXAM: CHEST  1 VIEW COMPARISON:  Two days ago FINDINGS: Stable cardiomediastinal silhouette. Left-sided chest tube is noted without pneumothorax. Bibasilar atelectasis or edema is noted with associated pleural effusions, right greater than left. IMPRESSION: Left-sided chest tube. Bibasilar atelectasis or edema is noted with associated pleural effusions, right greater than left. Electronically Signed   By: Lynwood Landy Raddle M.D.   On: 09/26/2024 12:11   IR THORACENTESIS ASP PLEURAL SPACE W/IMG GUIDE Result Date: 09/26/2024 INDICATION: Patient with history of metastatic breast cancer and bilateral pleural effusions with prior left PleurX placement on 08/28/2024; request now received for therapeutic right thoracentesis; patient has also had recent minimal output from existing left PleurX catheter. EXAM: ULTRASOUND GUIDED THERAPEUTIC RIGHT THORACENTESIS MEDICATIONS: 8 mL 1% lidocaine  with epinephrine  to skin/subcutaneous tissue COMPLICATIONS: None immediate. PROCEDURE: An ultrasound guided thoracentesis was thoroughly discussed with the patient and questions answered. The benefits, risks, alternatives and complications were also discussed. The patient understands and wishes to proceed with the procedure. Written consent was obtained. Ultrasound was performed to localize and mark an adequate pocket of fluid in the right chest. The area was then prepped and draped in the normal sterile fashion. 1% Lidocaine  with epinephrine  was used for local anesthesia. Under ultrasound guidance a 6 Fr Safe-T-Centesis catheter was introduced. Thoracentesis was  performed. The catheter was removed and a dressing applied. FINDINGS: A total of approximately 1.4 liters of hazy, amber fluid was removed. Due to persistent patient coughing only the above amount of fluid was removed today. Minimal free pleural fluid noted around left Pleurx catheter on today's ultrasound. IMPRESSION: Successful ultrasound guided therapeutic right thoracentesis yielding 1.4 liters of pleural fluid .Minimal free pleural fluid noted around left Pleurx catheter on today's ultrasound. Performed by: Franky Rakers, PA-C Electronically Signed   By: CHRISTELLA.  Shick M.D.   On: 09/26/2024 12:00   IR Radiologist Eval & Mgmt Result Date: 09/26/2024 EXAM: ESTABLISHED PATIENT OFFICE VISIT CHIEF COMPLAINT: See EPIC note HISTORY OF PRESENT ILLNESS: See EPIC note REVIEW OF SYSTEMS: See EPIC note PHYSICAL EXAMINATION: See EPIC note ASSESSMENT AND PLAN: Se EPIC note Performed by: Franky Rakers, PA-C Electronically Signed   By: CHRISTELLA.  Shick M.D.   On: 09/26/2024 11:59   DG Chest 1 View Result Date: 09/25/2024 CLINICAL DATA:  Shortness of breath x1 week EXAM: CHEST  1 VIEW COMPARISON:  September 12, 2024 FINDINGS: There is stable left-sided chest tube positioning. The heart size and mediastinal contours are within normal limits. Low lung volumes are noted with stable moderate severity elevation of the right hemidiaphragm. Diffuse, chronic appearing increased lung markings are seen with mild atelectasis and/or infiltrate present within the left lung base. Mild right basilar linear atelectasis is also seen with radiopaque surgical clips overlying the lateral aspect of the right lung base. There are stable small to moderate sized bilateral pleural effusions. No pneumothorax is identified. There is stable S shaped scoliosis of the thoracolumbar spine. IMPRESSION: 1. Stable left-sided chest tube positioning. 2. Stable small to moderate sized bilateral pleural effusions. 3. Mild left basilar atelectasis and/or infiltrate.  Electronically Signed   By: Suzen Dials M.D.   On: 09/25/2024 12:16   IR THORACENTESIS ASP PLEURAL SPACE W/IMG GUIDE Result Date: 09/07/2024 INDICATION: pleural effusion on right side, has pleurx on left side Patient with history of metastatic breast cancer, bilateral pleural effusions with prior left PleurX placement on 08/28/2024. Request now received for therapeutic right thoracentesis. EXAM: ULTRASOUND GUIDED  THERAPEUTIC RIGHT THORACENTESIS MEDICATIONS: 8 mL 1% lidocaine  with epinephrine  to skin/subcutaneous tissue COMPLICATIONS: None immediate. PROCEDURE: An ultrasound guided thoracentesis was thoroughly discussed with the patient and questions answered. The benefits, risks, alternatives and complications were also discussed. The patient understands and wishes to proceed with the procedure. Written consent was obtained. Ultrasound was performed to localize and mark an adequate pocket of fluid in the right chest. The area was then prepped and draped in the normal sterile fashion. 1% Lidocaine  with epinephrine  was used for local anesthesia. Under ultrasound guidance a 6 Fr Safe-T-Centesis catheter was introduced. Thoracentesis was performed. The catheter was removed and a dressing applied. FINDINGS: A total of approximately 1.3 L of hazy,amber fluid was removed. IMPRESSION: Successful ultrasound guided therapeutic RIGHT thoracentesis yielding 1.3 L of pleural fluid. Performed by: Franky Rakers, PA-C RECOMMENDATIONS: Large volume RIGHT thoracentesis, with recent LEFT tunneled pleural drainage PleurX catheter placement. Consider RIGHT PleurX catheter if recurrent pleural effusion. Thom Hall, MD [facility] Electronically Signed   By: Thom Hall M.D.   On: 09/07/2024 17:14   DG Chest 1 View Result Date: 09/07/2024 CLINICAL DATA:  [id number] S/P thoracentesis [id number] EXAM: PORTABLE CHEST 1 VIEW COMPARISON:  Chest XR, 08/29/2024.  CT chest,  08/25/2024. FINDINGS: Support lines; LEFT tunneled pleural catheter, stable in positioning with apically-directed tip. The RIGHT heart border is obscured. Aortic arch atherosclerosis. Relatively well aerated LEFT lung without discrete pleural effusion. Mildly improved aeration of the RIGHT lung post thoracentesis with small-to-moderate volume residual pleural effusion. No pneumothorax. RIGHT basilar consolidation. Postsurgical changes of RIGHT mastectomy, with axillary surgical clips. No interval osseous abnormality. IMPRESSION: 1. Mildly improved aeration of the RIGHT chest post thoracentesis with small volume residual pleural effusion. No pneumothorax. 2. RIGHT basilar consolidative opacity is likely to represent atelectasis. 3. Stable, well-positioned LEFT tunneled pleural drainage catheter. Electronically Signed   By: Thom Hall M.D.   On: 09/07/2024 13:52   DG Chest Port 1 View Result Date: 08/29/2024 EXAM: 1 VIEW(S) XRAY OF THE CHEST 08/29/2024 05:08:00 AM COMPARISON: Portable chest dated 08/28/2024 04:51 AM. CLINICAL HISTORY: A left chest tube was inserted by radiology yesterday afternoon. 1.2 liters of serous pleural fluid was removed. FINDINGS: LINES, TUBES AND DEVICES: There is a left chest tube now in place with the tip in the apex. Left IJ central line again terminates in the upper right atrium. LUNGS AND PLEURA: There are moderate-sized pleural effusions with a modest interval reduction in the left pleural effusion. There is patchy atelectasis or airspace disease in the lower lung fields. There is no measurable pneumothorax. HEART AND MEDIASTINUM: Cardiomegaly noted with increased central vascular prominence and development of mild interstitial edema. The mediastinum is stable. There is aortic tortuosity and atherosclerosis. BONES AND SOFT TISSUES: Right axillary clips. Moderate s-shaped thoracolumbar scoliosis. IMPRESSION: 1. Left chest tube in place with the tip in the apex. No measurable  pneumothorax. 2. Moderate-sized pleural effusions with modest interval reduction in the left pleural effusion. 3. Patchy atelectasis or airspace disease in the lower lung fields. 4. Cardiomegaly with increased central vascular prominence and development of mild interstitial edema. Electronically signed by: Francis Quam MD 08/29/2024 06:13 AM EST RP Workstation: [id number]   IR PERC PLEURAL DRAIN W/INDWELL CATH W/IMG GUIDE Result Date: 08/28/2024 CLINICAL DATA:  History of breast cancer. Recurrent question malignant pleural effusion EXAM: TUNNELED LEFT PLEURAL DRAINAGE CATHETER PLACEMENT COMPARISON:  CTA CHEST, 08/25/2024 MEDICATIONS: Ancef  2 gm IV; Antibiotic was administered in an appropriate time interval  for the procedure. ANESTHESIA/SEDATION: Moderate (conscious) sedation was employed during this procedure. A total of Versed  2 mg and Fentanyl  100 mcg was administered intravenously. Moderate Sedation Time: 17 minutes. The patient's level of consciousness and vital signs were monitored continuously by radiology nursing throughout the procedure under my direct supervision. FLUOROSCOPY: Radiation Exposure Index and estimated peak skin dose (PSD); Reference air kerma (RAK), 1 mGy. COMPLICATIONS: None immediate. PROCEDURE: The procedure, risks, benefits, and alternatives were explained to the patient and/or patient's representative, who wishes to proceed with the placement of this permanent pleural catheter as they are seeking palliative care. The patient and/or patient's representative understands and consents to the procedure. The LEFT lateral chest and upper abdomen were prepped and draped in a sterile fashion, and a sterile drape was applied covering the operative field. A sterile gown and sterile gloves were used for the procedure. Initial ultrasound scanning and fluoroscopic imaging demonstrates a recurrent moderate to large pleural effusion. Under direct ultrasound guidance, the inferior lateral pleural  space was accessed with a Yueh sheath needle after the overlying soft tissues were anesthetized with 1% lidocaine  with epinephrine . An Amplatz super stiff wire was then advanced under fluoroscopy into the pleural space. A 15.5 Fr tunneled Pleur-X catheter was tunneled from an incision within the right upper abdominal quadrant to the access site. The pleural access site was serially dilated under fluoroscopy, ultimately allowing placement of a peel-away sheath. The catheter was advanced through the peel-away sheath. The sheath was then removed. Final catheter positioning was confirmed with a fluoroscopic radiographic image. The access incision was closed with an interrupted subcutaneous subcuticular 2-0 Vicryl, then Dermabond was applied over the skin. A 2-0 Ethilon retention suture was applied at the catheter exit site. Large volume thoracentesis was performed through the new catheter utilizing provided bulb vacuum assisted drainage bag. Dressings were applied. The patient tolerated the above procedure well without immediate postprocedural complication. FINDINGS: Preprocedural ultrasound scanning demonstrates a recurrent moderate sized LEFT sided pleural effusion. After ultrasound and fluoroscopic guided placement, the catheter is directed basilar, then apically Following catheter placement, approximately 1200 mL of thin serous pleural fluid was removed. IMPRESSION: Successful placement of permanent, tunneled LEFT pleural drainage catheter via lateral approach. 1200 mL of thin serous pleural fluid was removed following catheter placement. RECOMMENDATIONS: Intermittent drainage instructions per pulmonary/oncology teams. Thom Hall, MD [facility] Radiology Electronically Signed   By: Thom Hall M.D.   On: 08/28/2024 17:48     ASSESSMENT/PLAN:  This is a very pleasant 68 year old Caucasian female with metastatic estrogen receptor positive breast cancer.  She  is currently undergoing treatment with Enhertu  IV every 3 weeks.  She status post 2 cycles.  The patient is here for cycle #3 today.  Labs were reviewed.  Recommend that she ***with cycle #3 today scheduled.  Dr. Godina will see her back in 3 weeks before undergoing cycle #4  Chest x-ray to assess pleural effusion?  She will continue wearing supplemental oxygen.  She will continue to manage her cancer related pain with oxycodone .  She will continue with Compazine  and Zofran  if needed for nausea and vomiting.  She will continue on Synthroid  for her hypothyroidism.  ***Not on diltiazem  due to hypotension??  Sees cardiology??  The patient was advised to call immediately if she has any concerning symptoms in the interval. The patient voices understanding of current disease status and treatment options and is in agreement with the current care plan. All questions were answered. The  patient knows to call the clinic with any problems, questions or concerns. We can certainly see the patient much sooner if necessary  No orders of the defined types were placed in this encounter.    I spent {CHL ONC TIME VISIT - DTPQU:8845999869} counseling the patient face to face. The total time spent in the appointment was {CHL ONC TIME VISIT - DTPQU:8845999869}.  Leronda Lewers L Leshae Mcclay, PA-C 09/27/2024

## 2024-09-28 ENCOUNTER — Other Ambulatory Visit: Payer: Self-pay

## 2024-09-28 DIAGNOSIS — Z170 Estrogen receptor positive status [ER+]: Secondary | ICD-10-CM

## 2024-10-01 ENCOUNTER — Emergency Department (HOSPITAL_COMMUNITY)
Admission: EM | Admit: 2024-10-01 | Discharge: 2024-10-01 | Discharge status: Against Medical Advice | Attending: Emergency Medicine | Admitting: Emergency Medicine

## 2024-10-01 ENCOUNTER — Ambulatory Visit (HOSPITAL_BASED_OUTPATIENT_CLINIC_OR_DEPARTMENT_OTHER): Payer: Self-pay | Admitting: Pulmonary Disease

## 2024-10-01 ENCOUNTER — Telehealth (HOSPITAL_BASED_OUTPATIENT_CLINIC_OR_DEPARTMENT_OTHER): Payer: Self-pay

## 2024-10-01 DIAGNOSIS — Z5321 Procedure and treatment not carried out due to patient leaving prior to being seen by health care provider: Secondary | ICD-10-CM | POA: Diagnosis not present

## 2024-10-01 DIAGNOSIS — R0602 Shortness of breath: Secondary | ICD-10-CM | POA: Diagnosis present

## 2024-10-01 NOTE — Telephone Encounter (Signed)
 Called back. Patient declined to wait in ED after not being seen by triage RN for > 2.5 hours. I discussed warning signs to go back to ED with Dr. Atilano, her husband. He notes understanding. We agreed that EMS should be called so her work up would be expedited. If not in ED by tonight, she will come tomorrow to office to get evaluated and then we would decide on whether she needs to see ED or not.

## 2024-10-01 NOTE — Progress Notes (Deleted)
 "  New Patient Pulmonology Office Visit   Subjective:  Patient ID: Caitlin [name], female    DOB: 01-07-1956  MRN: [id number]  Referred by: Toribio Jerel MATSU, MD  CC: No chief complaint on file.   HPI Caitlin [name] is a 68 y.o. 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    {PULM QUESTIONNAIRES (Optional):33196}  ROS  Allergies: Penicillins Current Medications[1] Past Medical History:  Diagnosis Date   Asthma    Breast cancer (HCC)    right   Complication of anesthesia    Dyspnea    Dysrhythmia    2012   Headache    History of kidney stones    passed   Hypothyroidism    Pneumonia 01/26/2023   PONV (postoperative nausea and vomiting)    rt breast ca 08/2022   Right Breast   Scoliosis    Thyroid  disease    Past Surgical History:  Procedure Laterality Date   APPENDECTOMY     AXILLARY SENTINEL NODE BIOPSY Right 10/14/2022   Procedure: RIGHT AXILLARY SENTINEL NODE BIOPSY;  Surgeon: Ebbie Cough, MD;  Location: [facility] OR;  Service: General;  Laterality: Right;   BREAST BIOPSY Right 09/15/2022   US  RT BREAST BX W LOC DEV 1ST LESION IMG BX SPEC US  GUIDE 09/15/2022 [facility]   BREAST BIOPSY  10/12/2022   US  RT RADIOACTIVE SEED LOC 10/12/2022 [facility]   BREAST LUMPECTOMY WITH RADIOACTIVE SEED AND SENTINEL LYMPH NODE BIOPSY Right 10/14/2022   Procedure: RIGHT BREAST LUMPECTOMY WITH RADIOACTIVE SEED;  Surgeon: Ebbie Cough, MD;  Location: [facility] OR;  Service: General;  Laterality: Right;   COLONOSCOPY     IR PERC PLEURAL DRAIN W/INDWELL CATH W/IMG GUIDE  08/28/2024   IR RADIOLOGIST EVAL & MGMT  09/26/2024   IR THORACENTESIS ASP PLEURAL SPACE W/IMG GUIDE  09/07/2024   IR THORACENTESIS ASP PLEURAL SPACE W/IMG GUIDE  09/26/2024   IR THROMBECT PRIM MECH INIT (INCLU) MOD SED   08/25/2024   IR TUNNELED CENTRAL VENOUS CATH PLC W IMG  08/25/2024   IR US  GUIDE VASC ACCESS LEFT  08/25/2024   IR US  GUIDE VASC ACCESS RIGHT  08/25/2024   LAPAROSCOPIC APPENDECTOMY     MASTECTOMY Right    PORT-A-CATH REMOVAL Left 05/03/2023   Procedure: REMOVAL PORT-A-CATH;  Surgeon: Ebbie Cough, MD;  Location: [facility] OR;  Service: General;  Laterality: Left;   PORT-A-CATH REMOVAL Left 12/22/2023   Procedure: REMOVAL PORT-A-CATH;  Surgeon: Ebbie Cough, MD;  Location: [facility];  Service: General;  Laterality: Left;   PORTACATH PLACEMENT Left 10/14/2022   Procedure: INSERTION PORT-A-CATH;  Surgeon: Ebbie Cough, MD;  Location: [facility] OR;  Service: General;  Laterality: Left;   PORTACATH PLACEMENT Left 05/19/2023   Procedure: PORT PLACEMENT WITH ULTRASOUND GUIDANCE;  Surgeon: Ebbie Cough, MD;  Location: [facility];  Service: General;  Laterality: Left;   RE-EXCISION OF BREAST LUMPECTOMY Right 11/08/2022   Procedure: RE-EXCISION OF RIGHT BREAST LUMPECTOMY;  Surgeon: Ebbie Cough, MD;  Location:  SURGERY CENTER;  Service: General;  Laterality: Right;   RE-EXCISION OF BREAST LUMPECTOMY Right 11/18/2022   Procedure: RE-EXCISION OF RIGHT BREAST LUMPECTOMY;  Surgeon: Ebbie Cough, MD;  Location: [facility] OR;  Service: General;  Laterality: Right;   SIMPLE MASTECTOMY WITH AXILLARY SENTINEL NODE BIOPSY Right 05/03/2023   Procedure: RIGHT MASTECTOMY;  Surgeon: Ebbie Cough, MD;  Location: [facility] OR;  Service: General;  Laterality: Right;  GEN & PEC BLOCK   WISDOM TOOTH EXTRACTION     Family History  Problem Relation Age of Onset   Thyroid  disease Mother    Prostate cancer Father 35       metastatic   Hypertension Father    Thyroid  disease Sister        twin sister age 61   Asthma Sister    Prostate cancer Brother 55   Heart attack Maternal Grandmother        died age 1's   Heart attack Maternal Grandfather        died age  41's   Clotting disorder Paternal Grandmother        cerebral hemorrage   Social History   Socioeconomic History   Marital status: Married    Spouse name: Not on file   Number of children: Not on file   Years of education: Not on file   Highest education level: Not on file  Occupational History   Not on file  Tobacco Use   Smoking status: Never   Smokeless tobacco: Not on file  Vaping Use   Vaping status: Never Used  Substance and Sexual Activity   Alcohol use: Not Currently   Drug use: Never   Sexual activity: Not Currently  Other Topics Concern   Not on file  Social History Narrative   Not on file   Social Drivers of Health   Tobacco Use: Low Risk (09/03/2024)   Received from [facility]   Patient History    Smoking Tobacco Use: Never    Smokeless Tobacco Use: Never    Passive Exposure: Not on file  Financial Resource Strain: Low Risk (09/03/2024)   Received from [facility]   Overall Financial Resource Strain (CARDIA)    How hard is it for you to pay for the very basics like food, housing, medical care, and heating?: Not hard at all  Food Insecurity: No Food Insecurity (09/03/2024)   Received from [facility]   Epic    Within the past 12 months, you worried that your food would run out before you got the money to buy more.: Never true    Within the past 12 months, the food you bought just didn't last and you didn't have money to get more.: Never true  Transportation Needs: No Transportation Needs (09/03/2024)   Received from [facility] - Transportation    Lack of Transportation (Medical): No    Lack of Transportation (Non-Medical): No  Physical Activity: Inactive (09/03/2024)   Received from [facility]   Exercise Vital Sign    On average, how many days per week do you engage in moderate to strenuous exercise (like a brisk walk)?: 0 days    On average, how many minutes do you engage in exercise at this level?: 0 min  Stress: Stress  Concern Present (09/03/2024)   Received from [facility] of Occupational [facility] - Occupational Stress Questionnaire    Do you feel stress - tense, restless, nervous, or anxious, or unable to sleep at night because your mind is troubled all the time - these days?: To some extent  Social Connections: Socially Integrated (09/03/2024)   Received from [facility]   Social Connection and Isolation Panel    In a typical week, how many times do you talk on the  phone with family, friends, or neighbors?: More than three times a week    How often do you get together with friends or relatives?: More than three times a week    How often do you attend church or religious services?: More than 4 times per year    Do you belong to any clubs or organizations such as church groups, unions, fraternal or athletic groups, or school groups?: Yes    How often do you attend meetings of the clubs or organizations you belong to?: More than 4 times per year    Are you married, widowed, divorced, separated, never married, or living with a partner?: Married  Intimate Partner Violence: Not At Risk (08/26/2024)   Epic    Fear of Current or Ex-Partner: No    Emotionally Abused: No    Physically Abused: No    Sexually Abused: No  Depression (PHQ2-9): Low Risk (05/27/2023)   Depression (PHQ2-9)    PHQ-2 Score: 0  Alcohol Screen: Low Risk (05/27/2023)   Alcohol Screen    Last Alcohol Screening Score (AUDIT): 0  Housing: Low Risk (08/26/2024)   Epic    Unable to Pay for Housing in the Last Year: No    Number of Times Moved in the Last Year: 0    Homeless in the Last Year: No  Utilities: Not At Risk (08/26/2024)   Epic    Threatened with loss of utilities: No  Health Literacy: Low Risk (11/12/2022)   Received from [facility] Literacy    How often do you need to have someone help you when you read instructions, pamphlets, or other written material from your doctor or pharmacy?: Never        Objective:  There were no vitals taken for this visit. {Pulm Vitals (Optional):32837}  Physical Exam  Diagnostic Review:  {Labs (Optional):32838}     Assessment & Plan:   Assessment & Plan   No orders of the defined types were placed in this encounter.     No follow-ups on file.   Di Jasmer, MD    [1]  Current Outpatient Medications:    albuterol  (VENTOLIN  HFA) 108 (90 Base) MCG/ACT inhaler, Inhale 2 puffs into the lungs every 4 (four) hours as needed for shortness of breath (Asthma)., Disp: , Rfl:    alendronate  (FOSAMAX ) 70 MG tablet, Take 1 tablet (70 mg total) by mouth once a week. Take with a full glass of water  on an empty stomach., Disp: 12 tablet, Rfl: 3   APIXABAN  (ELIQUIS ) VTE STARTER PACK (10MG  AND 5MG ), Take as directed on package: start with two-5mg  tablets twice daily for 7 days. On day 8, switch to one-5mg  tablet twice daily., Disp: 74 each, Rfl: 0   cetirizine (ZYRTEC) 10 MG tablet, Take 10 mg by mouth daily., Disp: , Rfl:    dexamethasone  (DECADRON ) 4 MG tablet, Take 1 tablets  by mouth daily for 3 days starting the day after chemotherapy. Take with food., Disp: 30 tablet, Rfl: 1   diltiazem  (CARDIZEM  CD) 120 MG 24 hr capsule, Take 120 mg by mouth daily., Disp: , Rfl:    levothyroxine  (SYNTHROID ) 75 MCG tablet, Take 75 mcg by mouth daily before breakfast., Disp: , Rfl:    lidocaine -prilocaine  (EMLA ) cream, Apply to affected area once, Disp: 30 g, Rfl: 3   Multiple Vitamins-Minerals (MULTIVITAMIN WITH MINERALS) tablet, Take 1 tablet by mouth daily., Disp: , Rfl:    ondansetron  (ZOFRAN ) 8 MG tablet, Take 1 tablet (8  mg total) by mouth every 8 (eight) hours as needed for nausea or vomiting. Start on the third day after chemotherapy., Disp: 30 tablet, Rfl: 1   OVER THE COUNTER MEDICATION, Take 1 capsule by mouth 2 (two) times daily. copaiba softgels, Disp: , Rfl:    oxyCODONE  (OXY IR/ROXICODONE ) 5 MG immediate release tablet, Take 1 tablet (5 mg  total) by mouth every 6 (six) hours as needed for severe pain (pain score 7-10)., Disp: 60 tablet, Rfl: 0   polyethylene glycol (MIRALAX  / GLYCOLAX ) 17 g packet, Take 17 g by mouth daily as needed for moderate constipation., Disp: 14 each, Rfl: 0   prochlorperazine  (COMPAZINE ) 10 MG tablet, Take 1 tablet (10 mg total) by mouth every 6 (six) hours as needed for nausea or vomiting., Disp: 60 tablet, Rfl: 6   PULMICORT  FLEXHALER 180 MCG/ACT inhaler, Inhale 1 puff into the lungs daily., Disp: , Rfl:   "

## 2024-10-01 NOTE — Telephone Encounter (Signed)
 2 episodes of SVT in last few weeks. Started on Cardizem . She became more short of breath over past few days. Started on 4 L O2 by [place] but still has desaturation episodes on exertion. R thoracentesis completed on christmas eve drained around 1400 mL. Still on Eliquis  for pulmonary embolism. CXRs did not show any recurrent left effusion. PleurX drained once weekly. JVD is elevated and lower extremity edema per her husband. Started lasix because of that. I advised patient to go to the ED to get evaluated since she's on new oxygen requirement and there's not a clear cut etiology to her symptoms.

## 2024-10-01 NOTE — Telephone Encounter (Unsigned)
 Copied from CRM #[id number]. Topic: Clinical - Medical Advice >> Oct 01, 2024  2:18 PM Isabell A wrote: Reason for CRM: Spouse calling to inform Dr. Catherine that patient was unable to wait at the hospital - she was waiting 2.5 hours and didn't see the triage nurse so she decided to leave. Spouse states she will be at her appointment tomorrow.  Callback number: [phone]

## 2024-10-01 NOTE — Telephone Encounter (Signed)
 FYI Only or Action Required?: Action required by provider: request for appointment, clinical question for provider, and update on patient condition.   Called Nurse Triage reporting Shortness of Breath.  Symptoms began yesterday.  Interventions attempted: Home oxygen use.  Symptoms are: gradually worsening.  Triage Disposition: Call PCP Now  Patient/caregiver understands and will follow disposition?: No, wishes to speak with PCP    Copied from CRM #[id number]. Topic: Clinical - Red Word Triage >> Oct 01, 2024  9:25 AM Leila C wrote: Red Word that prompted transfer to Nurse Triage: [name] spouse Deward [phone] is having more shortness of breath in the 12 hours, oxygen saturation is dropping to 80, tightness in chest, no pain. Patient has an appointment with Dr. Catherine 10/02/24. Deward states patient may not be able to come in for the office visit, should patient go to the ER and wants to speak with Dr. Catherine directly. Please advise.      Reason for Disposition  [1] Caller requests to speak ONLY to PCP AND [2] URGENT question  Answer Assessment - Initial Assessment Questions 1. REASON FOR CALL or QUESTION: What is your reason for calling today? or How can I best     Pt's husband called in to report worsening SOB; pt denies chest pain, reports chest tightness d/t SOB being present for >12h. Husband reports appt tomorrow with Dr. Catherine but pt was wanting to see him today. Discussed no appts but that I would send request in to office. Pt's husband would like to speak with provider to decide best plan of care for pt. Discussed pt is due for next does of palliative chemo Wednesday, 12/31. Reports he was going to take pt back to ED but knows with hospital admit that chemo will be delayed and it's the only therapy that aids with pt's pain.   Discussed recent hospitalization time line:  3 weeks ago ICU  Upon d/c SVT at home, EMS gave  adenosine  TSH level increased; increased  medication, fatigue has not improved  Over the past 12h HR 90-115 O2 4L down to 80s with exertion but rebounds back once at rest  New rx of lasix 20mg  d/t fluid  12/29 lasix 40mg  d/t SOB  On eliqius, has not missed any dosages --husband suspects possible PE Current VS: 4L at 96% HR 97 in NS per husband  Protocols used: PCP Call - No Triage-A-AH

## 2024-10-02 ENCOUNTER — Inpatient Hospital Stay (HOSPITAL_BASED_OUTPATIENT_CLINIC_OR_DEPARTMENT_OTHER): Admitting: Pulmonary Disease

## 2024-10-02 ENCOUNTER — Emergency Department (HOSPITAL_COMMUNITY)

## 2024-10-02 ENCOUNTER — Telehealth (HOSPITAL_BASED_OUTPATIENT_CLINIC_OR_DEPARTMENT_OTHER): Payer: Self-pay | Admitting: Pulmonary Disease

## 2024-10-02 ENCOUNTER — Encounter: Payer: Self-pay | Admitting: Hematology and Oncology

## 2024-10-02 ENCOUNTER — Telehealth: Payer: Self-pay

## 2024-10-02 ENCOUNTER — Emergency Department (HOSPITAL_COMMUNITY)
Admission: EM | Admit: 2024-10-02 | Discharge: 2024-10-02 | Discharge status: Against Medical Advice | Attending: Emergency Medicine | Admitting: Emergency Medicine

## 2024-10-02 ENCOUNTER — Other Ambulatory Visit: Payer: Self-pay

## 2024-10-02 DIAGNOSIS — J181 Lobar pneumonia, unspecified organism: Secondary | ICD-10-CM | POA: Insufficient documentation

## 2024-10-02 DIAGNOSIS — Z5329 Procedure and treatment not carried out because of patient's decision for other reasons: Secondary | ICD-10-CM | POA: Insufficient documentation

## 2024-10-02 DIAGNOSIS — R000 Tachycardia, unspecified: Secondary | ICD-10-CM | POA: Insufficient documentation

## 2024-10-02 DIAGNOSIS — Z85118 Personal history of other malignant neoplasm of bronchus and lung: Secondary | ICD-10-CM | POA: Insufficient documentation

## 2024-10-02 DIAGNOSIS — Z7901 Long term (current) use of anticoagulants: Secondary | ICD-10-CM | POA: Insufficient documentation

## 2024-10-02 DIAGNOSIS — J189 Pneumonia, unspecified organism: Secondary | ICD-10-CM

## 2024-10-02 LAB — COMPREHENSIVE METABOLIC PANEL WITH GFR
ALT: 10 U/L (ref 0–44)
AST: 50 U/L — ABNORMAL HIGH (ref 15–41)
Albumin: 3.1 g/dL — ABNORMAL LOW (ref 3.5–5.0)
Alkaline Phosphatase: 120 U/L (ref 38–126)
Anion gap: 14 (ref 5–15)
BUN: 26 mg/dL — ABNORMAL HIGH (ref 8–23)
CO2: 25 mmol/L (ref 22–32)
Calcium: 9.1 mg/dL (ref 8.9–10.3)
Chloride: 97 mmol/L — ABNORMAL LOW (ref 98–111)
Creatinine, Ser: 0.66 mg/dL (ref 0.44–1.00)
GFR, Estimated: >60 mL/min
Glucose, Bld: 106 mg/dL — ABNORMAL HIGH (ref 70–99)
Potassium: 4 mmol/L (ref 3.5–5.1)
Sodium: 137 mmol/L (ref 135–145)
Total Bilirubin: 0.3 mg/dL (ref 0.0–1.2)
Total Protein: 6.4 g/dL — ABNORMAL LOW (ref 6.5–8.1)

## 2024-10-02 LAB — CBC
HCT: 33.2 % — ABNORMAL LOW (ref 36.0–46.0)
Hemoglobin: 10.3 g/dL — ABNORMAL LOW (ref 12.0–15.0)
MCH: 30.8 pg (ref 26.0–34.0)
MCHC: 31 g/dL (ref 30.0–36.0)
MCV: 99.4 fL (ref 80.0–100.0)
Platelets: 796 K/uL — ABNORMAL HIGH (ref 150–400)
RBC: 3.34 MIL/uL — ABNORMAL LOW (ref 3.87–5.11)
RDW: 16.8 % — ABNORMAL HIGH (ref 11.5–15.5)
WBC: 12.8 K/uL — ABNORMAL HIGH (ref 4.0–10.5)
nRBC: 0 % (ref 0.0–0.2)

## 2024-10-02 LAB — RESP PANEL BY RT-PCR (RSV, FLU A&B, COVID)  RVPGX2
Influenza A by PCR: NEGATIVE
Influenza B by PCR: NEGATIVE
Resp Syncytial Virus by PCR: NEGATIVE
SARS Coronavirus 2 by RT PCR: NEGATIVE

## 2024-10-02 LAB — T4, FREE: Free T4: 0.84 ng/dL (ref 0.80–2.00)

## 2024-10-02 LAB — TSH: TSH: 25.5 u[IU]/mL — ABNORMAL HIGH (ref 0.350–4.500)

## 2024-10-02 MED ORDER — CEFDINIR 300 MG PO CAPS: 300.0000 mg | ORAL_CAPSULE | Freq: Two times a day (BID) | ORAL | 0 refills | Status: DC

## 2024-10-02 MED ORDER — AZITHROMYCIN 250 MG PO TABS: 250.0000 mg | ORAL_TABLET | Freq: Every day | ORAL | 0 refills | Status: DC

## 2024-10-02 MED ORDER — LIDOCAINE HCL 1 % IJ SOLN
INTRAMUSCULAR | Status: AC
  Filled 2024-10-02: qty 20

## 2024-10-02 MED ORDER — AZITHROMYCIN 250 MG PO TABS
500.0000 mg | ORAL_TABLET | Freq: Once | ORAL | Status: AC
  Administered 2024-10-02: 500 mg via ORAL
  Filled 2024-10-02: qty 2

## 2024-10-02 MED ORDER — CEFTRIAXONE SODIUM 1 G IJ SOLR
2.0000 g | Freq: Once | INTRAMUSCULAR | Status: AC
  Administered 2024-10-02: 2 g via INTRAMUSCULAR
  Filled 2024-10-02: qty 20

## 2024-10-02 NOTE — Discharge Instructions (Signed)
 Caitlin [name]  Thank you for allowing us  to take care of you today.  You came to the Emergency Department today because creasing oxygen requirement, and shortness of breath.  Here in the emergency department we got a x-ray which shows that you have worsening airspace disease in your left lung which is concerning for developing pneumonia.  Additionally you have a high heart rate as well as a increased white blood cell count which is concerning for pneumonia.  We recommend admission for antibiotics and monitoring, however given the unavailability of beds, you feel more comfortable continuing your oxygen at home since you have home oxygen, and taking antibiotics by mouth.  We cannot recommend this course of treatment, but we will prescribe you outpatient antibiotics to treat your symptoms.  If you develop any new or worsening symptoms, and or decide that you would like to come back to the emergency department to continue care, please return and we will further evaluate you.   To-Do: 1. Please follow-up with your primary doctor within 2 days / as soon as possible.  Return to the emergency department if you would like to continue your care inpatient, which we recommend.  Please return to the Emergency Department or call 911 if you experience have worsening of your symptoms, or do not get better, chest pain, shortness of breath, severe or significantly worsening pain, high fever, severe confusion, pass out or have any reason to think that you need emergency medical care.   We hope you feel better soon.   Mitzie Later, MD Department of Emergency Medicine [facility]

## 2024-10-02 NOTE — ED Provider Notes (Signed)
 "  EMERGENCY DEPARTMENT AT [facility] Provider Note   CSN: [id number] Arrival date & time: 10/02/24  9365     History Chief Complaint  Patient presents with   Shortness of Breath    HPI: Caitlin [name] is a 68 y.o. female with history pertinent for metastatic lung cancer with PleurX in place, chronic 2 to 3 L oxygen requirement, recent PE with thrombectomy who presents complaining of increased shortness of breath/dyspnea. Patient arrived via EMS accompanied by husband who reports that he is a retired family teacher, music.  History provided by patient and spouse/partner.  No interpreter required during this encounter.  Patient reports that she has a history of lung cancer, has had several days of increased dyspnea, and had increased oxygen requirement to 4 to 5 L from baseline of 2-3.  Dyspnea is worse with ambulation, also has had en route objectively measured fever.  Denies chest pain, nausea, vomiting, diarrhea.  [name] recorded medical, surgical, social, medication list and allergies were reviewed in the Snapshot window as part of the initial history.   Prior to Admission medications  Medication Sig Start Date End Date Taking? Authorizing Provider  azithromycin  (ZITHROMAX ) 250 MG tablet Take 1 tablet (250 mg total) by mouth daily. Take 1 tablet daily starting 12/31 10/02/24  Yes Rogelia Jerilynn GORMAN, MD  cefdinir (OMNICEF) 300 MG capsule Take 1 capsule (300 mg total) by mouth 2 (two) times daily for 7 days. 10/02/24 10/09/24 Yes Rogelia Jerilynn GORMAN, MD  albuterol  (VENTOLIN  HFA) 108 [phone] Base) MCG/ACT inhaler Inhale 2 puffs into the lungs every 4 (four) hours as needed for shortness of breath (Asthma). 05/30/22   [provider]  alendronate  (FOSAMAX ) 70 MG tablet Take 1 tablet (70 mg total) by mouth once a week. Take with a full glass of water  on an empty stomach. 12/21/23   Gudena, Vinay, MD  APIXABAN  (ELIQUIS ) VTE STARTER PACK (10MG  AND 5MG ) Take as  directed on package: start with two-5mg  tablets twice daily for 7 days. On day 8, switch to one-5mg  tablet twice daily. 08/29/24   Cheryle Page, MD  cetirizine (ZYRTEC) 10 MG tablet Take 10 mg by mouth daily.    [provider]  dexamethasone  (DECADRON ) 4 MG tablet Take 1 tablets  by mouth daily for 3 days starting the day after chemotherapy. Take with food. 07/25/24   Gudena, Vinay, MD  diltiazem  (CARDIZEM  CD) 120 MG 24 hr capsule Take 120 mg by mouth daily.    [provider]  levothyroxine  (SYNTHROID ) 75 MCG tablet Take 75 mcg by mouth daily before breakfast.    [provider]  lidocaine -prilocaine  (EMLA ) cream Apply to affected area once 07/25/24   Gudena, Vinay, MD  Multiple Vitamins-Minerals (MULTIVITAMIN WITH MINERALS) tablet Take 1 tablet by mouth daily.    [provider]  ondansetron  (ZOFRAN ) 8 MG tablet Take 1 tablet (8 mg total) by mouth every 8 (eight) hours as needed for nausea or vomiting. Start on the third day after chemotherapy. 07/25/24   Odean Potts, MD  OVER THE COUNTER MEDICATION Take 1 capsule by mouth 2 (two) times daily. copaiba softgels    [provider]  oxyCODONE  (OXY IR/ROXICODONE ) 5 MG immediate release tablet Take 1 tablet (5 mg total) by mouth every 6 (six) hours as needed for severe pain (pain score 7-10). 07/30/24   Gudena, Vinay, MD  polyethylene glycol (MIRALAX  / GLYCOLAX ) 17 g packet Take 17 g by mouth daily as needed for moderate constipation.  08/29/24   Cheryle Page, MD  prochlorperazine  (COMPAZINE ) 10 MG tablet Take 1 tablet (10 mg total) by mouth every 6 (six) hours as needed for nausea or vomiting. 09/05/24   Gudena, Vinay, MD  PULMICORT  FLEXHALER 180 MCG/ACT inhaler Inhale 1 puff into the lungs daily.    [provider]     Allergies: Penicillins   Review of Systems   ROS as per HPI  Physical Exam Updated Vital Signs BP (!) 116/103 (BP Location: Left Arm)   Pulse (!) 108   Temp 98.2 F  (36.8 C) (Oral)   Resp 16   SpO2 100%  Physical Exam Vitals and nursing note reviewed.  Constitutional:      Appearance: Normal appearance.     Comments: Chronically ill-appearing  HENT:     Head: Normocephalic and atraumatic.  Eyes:     Extraocular Movements: Extraocular movements intact.  Cardiovascular:     Rate and Rhythm: Tachycardia present.     Pulses: Normal pulses.  Pulmonary:     Effort: Pulmonary effort is normal. No tachypnea.     Breath sounds: Rhonchi (Scattered, more prominent in the right leg) present.     Comments: Saturating well on room 4 L nasal cannula Skin:    General: Skin is warm and dry.     Capillary Refill: Capillary refill takes less than 2 seconds.  Neurological:     General: No focal deficit present.     Mental Status: She is alert and oriented to person, place, and time.     ED Course/ Medical Decision Making/ A&P    Procedures Procedures   Medications Ordered in ED Medications  azithromycin  (ZITHROMAX ) tablet 500 mg (500 mg Oral Given 10/02/24 0950)  cefTRIAXone  (ROCEPHIN ) injection 2 g (2 g Intramuscular Given 10/02/24 1002)  lidocaine  (XYLOCAINE ) 1 % (with pres) injection (  Given 10/02/24 1003)    Medical Decision Making:   [name] is a 68 y.o. female who presents for shortness of breath as per above.  Physical exam is pertinent for tachycardia, rhonchi, increased oxygen requirement from baseline..   The differential includes but is not limited to sepsis, pneumonia, recurrent pulmonary embolism, viral illness, progression of malignancy, progression of known pleural effusion.  Independent historian: Spouse/partner  External data reviewed: Labs: reviewed prior labs for baseline  Initial Plan:  Screening labs including CBC and Metabolic panel to evaluate for infectious or metabolic etiology of disease.  TSH given history of hypothyroidism RVP given respiratory symptoms and a immunocompromised cancer patient Chest x-ray  to evaluate for structural/infectious intra-thoracic pathology.  EKG to evaluate for cardiac pathology Objective evaluation as below reviewed   Labs: Ordered, Independent interpretation, and Details: RVP negative COVID, flu, RSV.  TSH significantly elevated at 25, though notably similarly elevated to 22 1 month ago.  Free T4 in process at the time of discharge.  CMP without AKI, emergent electrolyte derangement, emergent LFT abnormality. CBC with leukocytosis which is new from baseline.  Stable anemia on comparison to prior.  Regression of previous thrombocytosis.  Radiology: Ordered, Independent interpretation, Details: Personally viewed chest x-ray, patient does have costophrenic blunting bilaterally as well as opacification in the left lower lung fields concerning for developing pneumonia, and All images reviewed independently.  Agree with radiology report at this time.   DG Chest 2 View Result Date: 10/02/2024 EXAM: 2 VIEW(S) XRAY OF THE CHEST 10/02/2024 07:19:00 AM COMPARISON: 09/26/2024 CLINICAL HISTORY: shortness of breath, new O2 requirement FINDINGS: LINES, TUBES AND DEVICES: Left  pleural drainage catheter in place. LUNGS AND PLEURA: Increased left pleural effusion. Persistent right effusion with basilar atelectasis. Worsening airspace disease at left lung base. No pneumothorax. HEART AND MEDIASTINUM: No acute abnormality of the cardiac and mediastinal silhouettes. BONES AND SOFT TISSUES: Dextroscoliosis thoracic spine. IMPRESSION: 1. Increased left pleural effusion with left pleural drainage catheter in place. 2. Persistent right effusion with basilar atelectasis. 3. Worsening airspace disease at left lung base. 4. Dextroscoliosis of the thoracic spine. Electronically signed by: Evalene Coho MD 10/02/2024 07:45 AM EST RP Workstation: [id number]    EKG/Medicine tests: Ordered and Independent interpretation sinus tachycardia, wander, RBBB                Interventions: Azithromycin ,  ceftriaxone , lidocaine   See the EMR for full details regarding lab and imaging results.  Patient presents to the emergency department for worsening dyspnea, sick symptoms, in the context of known malignancy.  Patient is mildly tachycardic, is chronically ill-appearing on exam, was seen in triage and found to have tachycardia, initial labs do demonstrate leukocytosis, though fortunately no evidence of COVID, flu, RSV on RVP.  Will patient was in the waiting room, I was approached by her husband, who identifies himself as a retired family conservation officer, nature, and notes that he had reviewed her chest x-ray with the patient's PCP who had been able to see it online, and expressed concern for pneumonia,, and requests antibiotics.  Azithromycin  and IM ceftriaxone  ordered.  Further discussed with patient, patient is saturating well on 4 L which is 1 L greater than her home 2 to 3 L oxygen requirement.  Patient with tachycardia, leukocytosis, evidence of developing pneumonia, therefore presentation is concerning for sepsis due to pneumonia, thus do feel that patient warrants admission for IV antibiotics, however there is not presently a ED bed available for the patient.  Husband states that he is able to further care for the patient at home and monitor her, expresses concern regarding further exposure to other illnesses while waiting in the waiting room, which is understandable.  They do have oxygen available at home, and state that they can follow-up closely with their PCP, discussed that I cannot recommend discharge given her increased oxygen requirement and tachycardia, thus if they would like to leave the emergency department, it would be a discharge AGAINST MEDICAL ADVICE, and I do recommend that they stay for continued management including IV antibiotics.  Patient and husband expressed understanding, however ultimately would like to continue their care outpatient, thus we will send in prescriptions for azithromycin   and cefdinir to treat for pneumonia as the next best therapy in the absence of admission for IV antibiotics.  Discussed return for any new or worsening symptoms, or should they decide that they would like to pursue inpatient treatment, patient and husband expressed understanding.  Discharged AMA.  Presentation is most consistent with acute complicated illness and Current presentation is complicated by underlying chronic conditions  Discussion of management or test interpretations with external provider(s): Not indicated  Risk Drugs:Prescription drug management Treatment: Discharged AMA  Disposition: Discharged AMA  MDM generated using voice dictation software and may contain dictation errors.  Please contact me for any clarification or with any questions.  Clinical Impression:  1. Pneumonia of left lower lobe due to infectious organism      AMA   Final Clinical Impression(s) / ED Diagnoses Final diagnoses:  Pneumonia of left lower lobe due to infectious organism    Rx / DC Orders ED Discharge Orders  Ordered    azithromycin  (ZITHROMAX ) 250 MG tablet  Daily        10/02/24 1009    cefdinir (OMNICEF) 300 MG capsule  2 times daily        10/02/24 1009             Rogelia Jerilynn RAMAN, MD 10/02/24 1124  "

## 2024-10-02 NOTE — ED Provider Triage Note (Cosign Needed)
 Emergency Medicine Provider Triage Evaluation Note  Caitlin [name] , a 68 y.o. female  was evaluated in triage.  Pt complains of new onset shortness of breath. Patient has a history of metastatic lung cancer with Pleurex placed on the left. Also had a PE recently with thrombectomy. Patient is normally on 2-3L of O2 at baseline however this had had to be increased to 4-5L due to dysnpea on exertion. [name] family member who is a physician is at bedside and states that patient drops down in to the 70's with ambulation.  Review of Systems  Positive: Shortness of breath, cold sweats Negative: Fever, chills, nausea, vomiting  Physical Exam  BP 113/80 (BP Location: Left Arm)   Pulse (!) 111   Temp 98.2 F (36.8 C) (Oral)   Resp 16   SpO2 95%  Gen:   Awake, no distress, chronically ill appearing Resp:  Normal effort  MSK:   Moves extremities without difficulty  Other:  On 4L of O2, rhonchi in lower lobes  Medical Decision Making  Medically screening exam initiated at 6:56 AM.  Appropriate orders placed.  Caitlin [name] was informed that the remainder of the evaluation will be completed by another provider, this initial triage assessment does not replace that evaluation, and the importance of remaining in the ED until their evaluation is complete.  Orders: CBC, CMP, TSH, Respiratory panel, CXR, EKG   Caitlin Terrall FALCON, PA-C 10/02/24 0701

## 2024-10-02 NOTE — Telephone Encounter (Signed)
 Copied from CRM #[id number]. Topic: Appointments - Appointment Scheduling >> Oct 02, 2024 11:26 AM Devaughn RAMAN wrote: [name] husband Caitlin [name] is returning a call to Dr.Alghanim he stated Dr.Alghanim called him this morning at [phone], he would like a f/u hospital appt for the pt no availability until 1/14 and would like the pt seen sooner, declined appt for now until he speaks with Dr.Alghanim   Dr A, can you advise? You do not have anything until 1/14 when you are back at [facility] clinic.[name] has a few openings next week though. Please call Dr Atilano to discuss.

## 2024-10-02 NOTE — ED Triage Notes (Signed)
 Pt BIB [facility] for increased dyspnea with exertion. Pt had PE x 5 weeks ago and had a thoracentesis x 5 days ago. Pt also has history of metastatic breast CA and SVT.

## 2024-10-02 NOTE — Telephone Encounter (Unsigned)
 Copied from CRM [id number]. Topic: Clinical - Medical Advice >> Oct 02, 2024 11:25 AM Devaughn RAMAN wrote: Reason for CRM: Deward pt's husband called to advise Dr.Alghanim there are no beds in [place] and they drew blood and drew an xray but they have taken the pt home. Deward stated he spoke with Dr.Alghanim.

## 2024-10-02 NOTE — Telephone Encounter (Signed)
 Talked to patient's husband. Waited in ER in the hallway. Per him, there are no rooms in ED or hospital. He reports that he talked to ED doc and they noted that she would have to wait in waiting room for many hours before ED bed is available. The hospital is bursting at the seams. CXR showed left lower lobe pneumonia. ED doc and prescribed cefdinir and azithromycin . Left AMA. Portable oxygen tank ran out without monitoring because how busy the ED is. She was given IM antibiotics with ceftriaxone  and azithromycin . He took her home because of how busy the ED is and the wait time for a bed. He even called [employer] where he worked and they are really busy as well and don't have beds. I talked to Dr. Kara who went to ED to see patient but patient has already left prior to his assessment and plan to trouble shoot left pleurX. The patient's husband had a few questions:  1) She has an appt to get ChemoRx tomorrow, should they go --> I recommended to defer chemo until antibiotics and pneumonia has resolved. I also recommended that they message primary oncologist regarding what happened. 2) Husband is a doctor and can get patient ceftriaxone  IM. He is asking about whether ceftraixone IM would be better than cefdinir PO. I told him to follow direction from ED doctor and that cefdinir and ceftriaxone  likely have the same potency so either or is OK with me. 3) Follow up appt, requesting appt for follow up for next week. I offered to double book in [place]. Ms. MARLA or [facility], can you call him to add him to my schedule for next week sometime Jan 7th or 8th?

## 2024-10-03 ENCOUNTER — Inpatient Hospital Stay: Admitting: Physician Assistant

## 2024-10-03 ENCOUNTER — Inpatient Hospital Stay

## 2024-10-04 ENCOUNTER — Observation Stay (HOSPITAL_COMMUNITY)

## 2024-10-04 ENCOUNTER — Other Ambulatory Visit: Payer: Self-pay

## 2024-10-04 ENCOUNTER — Inpatient Hospital Stay (HOSPITAL_COMMUNITY)
Admission: EM | Admit: 2024-10-04 | Discharge: 2024-10-09 | DRG: 871 | Disposition: A | Discharge status: Hospice | Source: Intra-hospital

## 2024-10-04 ENCOUNTER — Encounter (HOSPITAL_COMMUNITY): Payer: Self-pay

## 2024-10-04 ENCOUNTER — Emergency Department (HOSPITAL_COMMUNITY)

## 2024-10-04 DIAGNOSIS — R627 Adult failure to thrive: Secondary | ICD-10-CM | POA: Diagnosis present

## 2024-10-04 DIAGNOSIS — Z17421 Hormone receptor negative with human epidermal growth factor receptor 2 negative status: Secondary | ICD-10-CM

## 2024-10-04 DIAGNOSIS — I480 Paroxysmal atrial fibrillation: Secondary | ICD-10-CM | POA: Diagnosis present

## 2024-10-04 DIAGNOSIS — Z9011 Acquired absence of right breast and nipple: Secondary | ICD-10-CM

## 2024-10-04 DIAGNOSIS — Z79890 Hormone replacement therapy (postmenopausal): Secondary | ICD-10-CM

## 2024-10-04 DIAGNOSIS — Z7951 Long term (current) use of inhaled steroids: Secondary | ICD-10-CM

## 2024-10-04 DIAGNOSIS — C7802 Secondary malignant neoplasm of left lung: Secondary | ICD-10-CM | POA: Diagnosis present

## 2024-10-04 DIAGNOSIS — I4719 Other supraventricular tachycardia: Secondary | ICD-10-CM | POA: Diagnosis present

## 2024-10-04 DIAGNOSIS — I351 Nonrheumatic aortic (valve) insufficiency: Secondary | ICD-10-CM | POA: Diagnosis not present

## 2024-10-04 DIAGNOSIS — D849 Immunodeficiency, unspecified: Secondary | ICD-10-CM | POA: Diagnosis present

## 2024-10-04 DIAGNOSIS — C50911 Malignant neoplasm of unspecified site of right female breast: Secondary | ICD-10-CM | POA: Diagnosis present

## 2024-10-04 DIAGNOSIS — I471 Supraventricular tachycardia, unspecified: Secondary | ICD-10-CM | POA: Diagnosis not present

## 2024-10-04 DIAGNOSIS — Z825 Family history of asthma and other chronic lower respiratory diseases: Secondary | ICD-10-CM

## 2024-10-04 DIAGNOSIS — J9601 Acute respiratory failure with hypoxia: Secondary | ICD-10-CM

## 2024-10-04 DIAGNOSIS — E8770 Fluid overload, unspecified: Secondary | ICD-10-CM | POA: Diagnosis present

## 2024-10-04 DIAGNOSIS — E039 Hypothyroidism, unspecified: Secondary | ICD-10-CM | POA: Diagnosis present

## 2024-10-04 DIAGNOSIS — D75839 Thrombocytosis, unspecified: Secondary | ICD-10-CM | POA: Diagnosis present

## 2024-10-04 DIAGNOSIS — I890 Lymphedema, not elsewhere classified: Secondary | ICD-10-CM | POA: Diagnosis present

## 2024-10-04 DIAGNOSIS — Z79899 Other long term (current) drug therapy: Secondary | ICD-10-CM

## 2024-10-04 DIAGNOSIS — I10 Essential (primary) hypertension: Secondary | ICD-10-CM | POA: Diagnosis present

## 2024-10-04 DIAGNOSIS — I361 Nonrheumatic tricuspid (valve) insufficiency: Secondary | ICD-10-CM

## 2024-10-04 DIAGNOSIS — J9622 Acute and chronic respiratory failure with hypercapnia: Secondary | ICD-10-CM | POA: Diagnosis present

## 2024-10-04 DIAGNOSIS — D539 Nutritional anemia, unspecified: Secondary | ICD-10-CM | POA: Diagnosis present

## 2024-10-04 DIAGNOSIS — Z880 Allergy status to penicillin: Secondary | ICD-10-CM

## 2024-10-04 DIAGNOSIS — J45909 Unspecified asthma, uncomplicated: Secondary | ICD-10-CM | POA: Diagnosis present

## 2024-10-04 DIAGNOSIS — Z7901 Long term (current) use of anticoagulants: Secondary | ICD-10-CM

## 2024-10-04 DIAGNOSIS — J910 Malignant pleural effusion: Secondary | ICD-10-CM | POA: Diagnosis present

## 2024-10-04 DIAGNOSIS — J189 Pneumonia, unspecified organism: Secondary | ICD-10-CM | POA: Diagnosis not present

## 2024-10-04 DIAGNOSIS — D630 Anemia in neoplastic disease: Secondary | ICD-10-CM | POA: Diagnosis present

## 2024-10-04 DIAGNOSIS — Z7983 Long term (current) use of bisphosphonates: Secondary | ICD-10-CM

## 2024-10-04 DIAGNOSIS — A419 Sepsis, unspecified organism: Principal | ICD-10-CM | POA: Diagnosis present

## 2024-10-04 DIAGNOSIS — R6520 Severe sepsis without septic shock: Secondary | ICD-10-CM | POA: Diagnosis present

## 2024-10-04 DIAGNOSIS — Z923 Personal history of irradiation: Secondary | ICD-10-CM

## 2024-10-04 DIAGNOSIS — Z9221 Personal history of antineoplastic chemotherapy: Secondary | ICD-10-CM

## 2024-10-04 DIAGNOSIS — J9602 Acute respiratory failure with hypercapnia: Secondary | ICD-10-CM

## 2024-10-04 DIAGNOSIS — J90 Pleural effusion, not elsewhere classified: Secondary | ICD-10-CM

## 2024-10-04 DIAGNOSIS — Z1152 Encounter for screening for COVID-19: Secondary | ICD-10-CM

## 2024-10-04 DIAGNOSIS — Z6823 Body mass index (BMI) 23.0-23.9, adult: Secondary | ICD-10-CM

## 2024-10-04 DIAGNOSIS — K5909 Other constipation: Secondary | ICD-10-CM | POA: Diagnosis present

## 2024-10-04 DIAGNOSIS — L089 Local infection of the skin and subcutaneous tissue, unspecified: Secondary | ICD-10-CM | POA: Diagnosis present

## 2024-10-04 DIAGNOSIS — J9621 Acute and chronic respiratory failure with hypoxia: Secondary | ICD-10-CM | POA: Diagnosis present

## 2024-10-04 DIAGNOSIS — E874 Mixed disorder of acid-base balance: Secondary | ICD-10-CM | POA: Diagnosis present

## 2024-10-04 DIAGNOSIS — C799 Secondary malignant neoplasm of unspecified site: Secondary | ICD-10-CM

## 2024-10-04 DIAGNOSIS — Z8249 Family history of ischemic heart disease and other diseases of the circulatory system: Secondary | ICD-10-CM

## 2024-10-04 DIAGNOSIS — Z66 Do not resuscitate: Secondary | ICD-10-CM | POA: Diagnosis present

## 2024-10-04 DIAGNOSIS — Z823 Family history of stroke: Secondary | ICD-10-CM

## 2024-10-04 DIAGNOSIS — Z8349 Family history of other endocrine, nutritional and metabolic diseases: Secondary | ICD-10-CM

## 2024-10-04 DIAGNOSIS — Z832 Family history of diseases of the blood and blood-forming organs and certain disorders involving the immune mechanism: Secondary | ICD-10-CM

## 2024-10-04 DIAGNOSIS — C7951 Secondary malignant neoplasm of bone: Secondary | ICD-10-CM | POA: Diagnosis present

## 2024-10-04 DIAGNOSIS — Z86711 Personal history of pulmonary embolism: Secondary | ICD-10-CM

## 2024-10-04 DIAGNOSIS — E441 Mild protein-calorie malnutrition: Secondary | ICD-10-CM | POA: Diagnosis present

## 2024-10-04 DIAGNOSIS — Z515 Encounter for palliative care: Secondary | ICD-10-CM

## 2024-10-04 LAB — I-STAT CHEM 8, ED
BUN: 39 mg/dL — ABNORMAL HIGH (ref 8–23)
Calcium, Ion: 1.12 mmol/L — ABNORMAL LOW (ref 1.15–1.40)
Chloride: 100 mmol/L (ref 98–111)
Creatinine, Ser: 0.8 mg/dL (ref 0.44–1.00)
Glucose, Bld: 106 mg/dL — ABNORMAL HIGH (ref 70–99)
HCT: 32 % — ABNORMAL LOW (ref 36.0–46.0)
Hemoglobin: 10.9 g/dL — ABNORMAL LOW (ref 12.0–15.0)
Potassium: 4.7 mmol/L (ref 3.5–5.1)
Sodium: 137 mmol/L (ref 135–145)
TCO2: 28 mmol/L (ref 22–32)

## 2024-10-04 LAB — I-STAT VENOUS BLOOD GAS, ED
Acid-Base Excess: 5 mmol/L — ABNORMAL HIGH (ref 0.0–2.0)
Bicarbonate: 31 mmol/L — ABNORMAL HIGH (ref 20.0–28.0)
Calcium, Ion: 1.13 mmol/L — ABNORMAL LOW (ref 1.15–1.40)
HCT: 32 % — ABNORMAL LOW (ref 36.0–46.0)
Hemoglobin: 10.9 g/dL — ABNORMAL LOW (ref 12.0–15.0)
O2 Saturation: 95 %
Potassium: 4.8 mmol/L (ref 3.5–5.1)
Sodium: 137 mmol/L (ref 135–145)
TCO2: 33 mmol/L — ABNORMAL HIGH (ref 22–32)
pCO2, Ven: 53.4 mmHg (ref 44–60)
pH, Ven: 7.371 (ref 7.25–7.43)
pO2, Ven: 79 mmHg — ABNORMAL HIGH (ref 32–45)

## 2024-10-04 LAB — CBC WITH DIFFERENTIAL/PLATELET
Abs Immature Granulocytes: 0.36 K/uL — ABNORMAL HIGH (ref 0.00–0.07)
Basophils Absolute: 0.1 K/uL (ref 0.0–0.1)
Basophils Relative: 0 %
Eosinophils Absolute: 0 K/uL (ref 0.0–0.5)
Eosinophils Relative: 0 %
HCT: 33 % — ABNORMAL LOW (ref 36.0–46.0)
Hemoglobin: 10.3 g/dL — ABNORMAL LOW (ref 12.0–15.0)
Immature Granulocytes: 2 %
Lymphocytes Relative: 3 %
Lymphs Abs: 0.6 K/uL — ABNORMAL LOW (ref 0.7–4.0)
MCH: 31.8 pg (ref 26.0–34.0)
MCHC: 31.2 g/dL (ref 30.0–36.0)
MCV: 101.9 fL — ABNORMAL HIGH (ref 80.0–100.0)
Monocytes Absolute: 1.7 K/uL — ABNORMAL HIGH (ref 0.1–1.0)
Monocytes Relative: 9 %
Neutro Abs: 15.5 K/uL — ABNORMAL HIGH (ref 1.7–7.7)
Neutrophils Relative %: 86 %
Platelets: 805 K/uL — ABNORMAL HIGH (ref 150–400)
RBC: 3.24 MIL/uL — ABNORMAL LOW (ref 3.87–5.11)
RDW: 16.7 % — ABNORMAL HIGH (ref 11.5–15.5)
WBC: 18.2 K/uL — ABNORMAL HIGH (ref 4.0–10.5)
nRBC: 0 % (ref 0.0–0.2)

## 2024-10-04 LAB — COMPREHENSIVE METABOLIC PANEL WITH GFR
ALT: 11 U/L (ref 0–44)
AST: 44 U/L — ABNORMAL HIGH (ref 15–41)
Albumin: 3.2 g/dL — ABNORMAL LOW (ref 3.5–5.0)
Alkaline Phosphatase: 116 U/L (ref 38–126)
Anion gap: 13 (ref 5–15)
BUN: 32 mg/dL — ABNORMAL HIGH (ref 8–23)
CO2: 26 mmol/L (ref 22–32)
Calcium: 9.1 mg/dL (ref 8.9–10.3)
Chloride: 100 mmol/L (ref 98–111)
Creatinine, Ser: 0.64 mg/dL (ref 0.44–1.00)
GFR, Estimated: >60 mL/min
Glucose, Bld: 106 mg/dL — ABNORMAL HIGH (ref 70–99)
Potassium: 4.9 mmol/L (ref 3.5–5.1)
Sodium: 139 mmol/L (ref 135–145)
Total Bilirubin: <0.2 mg/dL (ref 0.0–1.2)
Total Protein: 6.5 g/dL (ref 6.5–8.1)

## 2024-10-04 LAB — ECHOCARDIOGRAM COMPLETE
AR max vel: 2.4 cm2
AV Area VTI: 2.35 cm2
AV Area mean vel: 2.36 cm2
AV Mean grad: 4 mmHg
AV Peak grad: 9.2 mmHg
Ao pk vel: 1.52 m/s
Area-P 1/2: 3.21 cm2
Height: 63 in
S' Lateral: 2.7 cm
Weight: 2128 [oz_av]

## 2024-10-04 LAB — TROPONIN T, HIGH SENSITIVITY: Troponin T High Sensitivity: 31 ng/L — ABNORMAL HIGH (ref 0–19)

## 2024-10-04 LAB — BLOOD GAS, VENOUS
Acid-Base Excess: 4 mmol/L — ABNORMAL HIGH (ref 0.0–2.0)
Bicarbonate: 34.9 mmol/L — ABNORMAL HIGH (ref 20.0–28.0)
O2 Saturation: 90.9 %
Patient temperature: 37
pCO2, Ven: 98 mmHg (ref 44–60)
pH, Ven: 7.16 — CL (ref 7.25–7.43)
pO2, Ven: 64 mmHg — ABNORMAL HIGH (ref 32–45)

## 2024-10-04 LAB — I-STAT CG4 LACTIC ACID, ED
Lactic Acid, Venous: 2.7 mmol/L (ref 0.5–1.9)
Lactic Acid, Venous: 1.7 mmol/L (ref 0.5–1.9)

## 2024-10-04 LAB — LACTIC ACID, PLASMA: Lactic Acid, Venous: 2.1 mmol/L (ref 0.5–1.9)

## 2024-10-04 LAB — VITAMIN B12: Vitamin B-12: 3712 pg/mL — ABNORMAL HIGH (ref 180–914)

## 2024-10-04 LAB — FOLATE: Folate: 13.6 ng/mL

## 2024-10-04 MED ORDER — ACETAMINOPHEN 650 MG RE SUPP: 650.0000 mg | RECTAL | Status: DC | PRN

## 2024-10-04 MED ORDER — KETOROLAC TROMETHAMINE 15 MG/ML IJ SOLN
15.0000 mg | Freq: Four times a day (QID) | INTRAMUSCULAR | Status: DC | PRN
  Administered 2024-10-05 – 2024-10-09 (×7): 15 mg via INTRAVENOUS
  Filled 2024-10-04 (×8): qty 1

## 2024-10-04 MED ORDER — ALBUTEROL SULFATE (2.5 MG/3ML) 0.083% IN NEBU
3.0000 mL | INHALATION_SOLUTION | RESPIRATORY_TRACT | Status: DC | PRN
  Administered 2024-10-07 – 2024-10-08 (×3): 3 mL via RESPIRATORY_TRACT
  Filled 2024-10-04 (×4): qty 3

## 2024-10-04 MED ORDER — DOCUSATE SODIUM 100 MG PO CAPS: 100.0000 mg | ORAL_CAPSULE | Freq: Two times a day (BID) | ORAL | Status: DC | PRN

## 2024-10-04 MED ORDER — ACETAMINOPHEN 500 MG PO TABS
500.0000 mg | ORAL_TABLET | ORAL | Status: DC | PRN
  Administered 2024-10-04 – 2024-10-09 (×8): 500 mg via ORAL
  Filled 2024-10-04 (×8): qty 1

## 2024-10-04 MED ORDER — APIXABAN 5 MG PO TABS: 5.0000 mg | ORAL_TABLET | Freq: Two times a day (BID) | ORAL | Status: DC

## 2024-10-04 MED ORDER — BUDESONIDE 0.25 MG/2ML IN SUSP
0.2500 mg | Freq: Every day | RESPIRATORY_TRACT | Status: DC
  Administered 2024-10-04 – 2024-10-09 (×6): 0.25 mg via RESPIRATORY_TRACT
  Filled 2024-10-04 (×6): qty 2

## 2024-10-04 MED ORDER — ADENOSINE 6 MG/2ML IV SOLN
6.0000 mg | Freq: Once | INTRAVENOUS | Status: AC
  Administered 2024-10-04: 6 mg via INTRAVENOUS
  Filled 2024-10-04: qty 2

## 2024-10-04 MED ORDER — AMIODARONE HCL IN DEXTROSE 360-4.14 MG/200ML-% IV SOLN
30.0000 mg/h | INTRAVENOUS | Status: DC
  Administered 2024-10-05 (×2): 30 mg/h via INTRAVENOUS
  Filled 2024-10-04 (×3): qty 200

## 2024-10-04 MED ORDER — PIPERACILLIN-TAZOBACTAM 3.375 G IVPB
3.3750 g | Freq: Three times a day (TID) | INTRAVENOUS | Status: DC
  Administered 2024-10-04 – 2024-10-09 (×15): 3.375 g via INTRAVENOUS
  Filled 2024-10-04 (×17): qty 50

## 2024-10-04 MED ORDER — LACTATED RINGERS IV BOLUS (SEPSIS)
250.0000 mL | Freq: Once | INTRAVENOUS | Status: AC
  Administered 2024-10-04: 250 mL via INTRAVENOUS

## 2024-10-04 MED ORDER — APIXABAN 5 MG PO TABS
5.0000 mg | ORAL_TABLET | Freq: Two times a day (BID) | ORAL | Status: DC
  Administered 2024-10-04 – 2024-10-06 (×3): 5 mg via ORAL
  Filled 2024-10-04 (×3): qty 1

## 2024-10-04 MED ORDER — OXYCODONE HCL 5 MG PO TABS
5.0000 mg | ORAL_TABLET | Freq: Four times a day (QID) | ORAL | Status: DC | PRN
  Administered 2024-10-04 (×2): 5 mg via ORAL
  Filled 2024-10-04 (×2): qty 1

## 2024-10-04 MED ORDER — DOXYCYCLINE HYCLATE 100 MG PO TABS
100.0000 mg | ORAL_TABLET | Freq: Two times a day (BID) | ORAL | Status: AC
  Administered 2024-10-04 – 2024-10-06 (×4): 100 mg via ORAL
  Filled 2024-10-04 (×4): qty 1

## 2024-10-04 MED ORDER — POLYETHYLENE GLYCOL 3350 17 G PO PACK: 17.0000 g | PACK | Freq: Every day | ORAL | Status: DC | PRN

## 2024-10-04 MED ORDER — LACTATED RINGERS IV BOLUS
500.0000 mL | Freq: Once | INTRAVENOUS | Status: AC
  Administered 2024-10-04: 500 mL via INTRAVENOUS

## 2024-10-04 MED ORDER — AMIODARONE HCL IN DEXTROSE 360-4.14 MG/200ML-% IV SOLN
60.0000 mg/h | INTRAVENOUS | Status: AC
  Administered 2024-10-04 (×2): 60 mg/h via INTRAVENOUS
  Filled 2024-10-04 (×2): qty 200

## 2024-10-04 MED ORDER — LEVOTHYROXINE SODIUM 100 MCG PO TABS
100.0000 ug | ORAL_TABLET | Freq: Every day | ORAL | Status: DC
  Administered 2024-10-04 – 2024-10-09 (×6): 100 ug via ORAL
  Filled 2024-10-04 (×6): qty 1

## 2024-10-04 MED ORDER — AMIODARONE LOAD VIA INFUSION
150.0000 mg | Freq: Once | INTRAVENOUS | Status: AC
  Administered 2024-10-04: 150 mg via INTRAVENOUS
  Filled 2024-10-04: qty 83.34

## 2024-10-04 MED ORDER — SODIUM CHLORIDE 0.9 % IV SOLN
2.0000 g | Freq: Once | INTRAVENOUS | Status: AC
  Administered 2024-10-04: 2 g via INTRAVENOUS
  Filled 2024-10-04: qty 20

## 2024-10-04 MED ORDER — SODIUM CHLORIDE 0.9 % IV SOLN
500.0000 mg | Freq: Once | INTRAVENOUS | Status: AC
  Administered 2024-10-04: 500 mg via INTRAVENOUS
  Filled 2024-10-04: qty 5

## 2024-10-04 MED ORDER — SODIUM CHLORIDE 0.9 % IV SOLN: 2.0000 g | Freq: Every day | INTRAVENOUS | Status: DC

## 2024-10-04 MED ORDER — DILTIAZEM HCL ER COATED BEADS 120 MG PO CP24
120.0000 mg | ORAL_CAPSULE | Freq: Two times a day (BID) | ORAL | Status: DC
  Filled 2024-10-04 (×2): qty 1

## 2024-10-04 MED ORDER — FUROSEMIDE 10 MG/ML IJ SOLN
20.0000 mg | Freq: Once | INTRAMUSCULAR | Status: AC
  Administered 2024-10-04: 20 mg via INTRAVENOUS
  Filled 2024-10-04: qty 2

## 2024-10-04 MED ORDER — BUDESONIDE 0.25 MG/2ML IN SUSP: 0.2500 mg | Freq: Two times a day (BID) | RESPIRATORY_TRACT | Status: DC

## 2024-10-04 MED ORDER — DOXYCYCLINE HYCLATE 100 MG PO TABS: 100.0000 mg | ORAL_TABLET | Freq: Two times a day (BID) | ORAL | Status: DC

## 2024-10-04 NOTE — ED Notes (Signed)
 RN unable to obtain blood cultures due to pt being a difficult stick

## 2024-10-04 NOTE — ED Triage Notes (Signed)
 Pt from home, pt woke at 6am and felt weak/dizzy. HX of SVT and pneumonia in left lung. Husband gave pt 30mg  of cardizem  this morning. BP was 74 systolic and O2 was 80% room air. EMS gave 500cc of fluid. HR 152. Axox4.

## 2024-10-04 NOTE — Progress Notes (Signed)
 Pharmacy Antibiotic Note  Caitlin [name] is a 69 y.o. female admitted on 10/04/2024 with sepsis.  Pharmacy has been consulted for Zosyn dosing.  Plan: Zosyn 3.375g IV q8h (4 hour infusion). Doxycycline per MD.  Noted PCN allergy - no hx of toleration within CHL. MD okay to proceed.  Follow culture data for de-escalation.  Monitor renal function for dose adjustments as indicated.   Height: 5' 3 (160 cm) Weight: 60.3 kg (133 lb) IBW/kg (Calculated) : 52.4  Temp (24hrs), Avg:97.7 F (36.5 C), Min:97.4 F (36.3 C), Max:98 F (36.7 C)  Recent Labs  Lab 10/02/24 0656 10/04/24 0902 10/04/24 0912 10/04/24 0916 10/04/24 1219  WBC 12.8* 18.2*  --   --   --   CREATININE 0.66 0.64 0.80  --   --   LATICACIDVEN  --   --   --  2.7* 1.7    Estimated Creatinine Clearance: 56.4 mL/min (by C-G formula based on SCr of 0.8 mg/dL).    Allergies[1]  Antimicrobials this admission: Ceftriaxone  + Azithro x1 1/1  Zosyn 1/1>>  Doxycycline 1/1 >>   Microbiology results: 1/1 BCx:   Thank you for allowing pharmacy to be a part of this patients care.  Powell Blush, PharmD, BCCCP  10/04/2024 2:17 PM     [1]  Allergies Allergen Reactions   Penicillins Rash    childhood reaction

## 2024-10-04 NOTE — ED Notes (Signed)
 Spoke to

## 2024-10-04 NOTE — H&P (Signed)
 " Date: 10/04/2024               Patient Name:  Caitlin [name] MRN: [id number]  DOB: 1956-08-19 Age / Sex: 69 y.o., female   PCP: Toribio Jerel MATSU, MD         Medical Service: Internal Medicine Teaching Service         Attending Physician: Dr. Reyes Fenton      First Contact: Sallyanne Primas, DO    Second Contact: Dr. Lonni Africa, DO         Pager Information: First Contact Pager: [phone]   Second Contact Pager: [phone]   SUBJECTIVE   Chief Complaint: SOB and chest tightness  History of Present Illness: Caitlin [name] is a 69 y.o. female with PMH of Afib, end-stage triple negative breast cancer s/p R total mastectomy on palliative chemo, lymphedema R arm s/p radiation, Hypothyroidism, recent PE 08/25/2024 s/p thrombectomy on Eliquis , previous episodes SVT, BL malignant pleural effusions s/p left PleurX catheter, and chronic hypoxic respiratory failure on 2 to 3 L oxygen baseline, asthma.  Patient spouse is bedside and is a primary care doctor.  He provides information for the HPI as patient is having difficulty speaking due to SOB.  Patient fills in information were needed.  On 12/28 night, husband reports that patient had developed sweats and cough.  They went to [facility] but the wait was too long in ER so they went home.  She began saturating around the 70s so they return to [facility] where it was found she had LLL infiltrate.  Unfortunately there were no beds available, so she went home with azithromycin  500 and rocephin  2g IM.  Received azithromycin  and rocephin  1g yesterday and got doxy last night too. Was feeling better last night and tolerated O2 reduction to 4L. This am, she has chest tightness which is a symptom that she has been feeling when she goes into SVT. Reports subjective fevers and mild purulent cough without evidence of blood. Oxygen baseline is 2-3L. Was on 5L most of time at home for the past week, briefly up to 6 after calling EMS.   Denies  abdominal pain or headache or changing/radiating CP.  No complaints of dysuria/increased urinary frequency, suffers from chronic constipation and uses a stool softener daily, but it has been several days.  R arm swelling worse per chronic lymphedema because she has been unable to wear compression sleeve.  Has been noticing some lower extremity edema as well.   ED Course: Labs significant for  - Leukocytosis (18.2), neutrophil 15.5, hemoglobin 10.3, MCV 101.9 - BUN 32, creatinine 0.64 - Lactic acid 2.7-->1.7 Imaging  - Chest x-ray: Increased diffuse interstitial and patchy opacities throughout left lung and within the right lower lung.  Stable large loculated left pleural effusion and increased moderate right pleural effusion  received ceftriaxone  and azithromycin  Consulted by IMTS  Past Medical History PCP: Toribio Jerel MATSU, MD  PMH of Afib, end-stage triple negative breast cancer s/p R total mastectomy on palliative chemo, lymphedema R arm s/p radiation, Hypothyroidism, recent PE 08/25/2024 s/p thrombectomy on Eliquis , previous episodes SVT, BL malignant pleural effusions s/p left PleurX catheter, and chronic hypoxic respiratory failure on 2 to 3 L oxygen baseline, asthma.  Meds: Patient reported:  Eliquis  5 BID Levothyroxine  100 but TSH still high Dilt 120 BID XR Oxycodone  2.5-5 prn for pain, variable intake.  Gets palliative chemo for pain re cancer.  Active Medications[1]  Past Surgical History Past Surgical History:  Procedure Laterality Date   APPENDECTOMY     AXILLARY SENTINEL NODE BIOPSY Right 10/14/2022   Procedure: RIGHT AXILLARY SENTINEL NODE BIOPSY;  Surgeon: Ebbie Cough, MD;  Location: [facility] OR;  Service: General;  Laterality: Right;   BREAST BIOPSY Right 09/15/2022   US  RT BREAST BX W LOC DEV 1ST LESION IMG BX SPEC US  GUIDE 09/15/2022 [facility]   BREAST BIOPSY  10/12/2022   US  RT RADIOACTIVE SEED LOC 10/12/2022 [facility]   BREAST LUMPECTOMY WITH  RADIOACTIVE SEED AND SENTINEL LYMPH NODE BIOPSY Right 10/14/2022   Procedure: RIGHT BREAST LUMPECTOMY WITH RADIOACTIVE SEED;  Surgeon: Ebbie Cough, MD;  Location: [facility] OR;  Service: General;  Laterality: Right;   COLONOSCOPY     IR PERC PLEURAL DRAIN W/INDWELL CATH W/IMG GUIDE  08/28/2024   IR RADIOLOGIST EVAL & MGMT  09/26/2024   IR THORACENTESIS ASP PLEURAL SPACE W/IMG GUIDE  09/07/2024   IR THORACENTESIS ASP PLEURAL SPACE W/IMG GUIDE  09/26/2024   IR THROMBECT PRIM MECH INIT (INCLU) MOD SED  08/25/2024   IR TUNNELED CENTRAL VENOUS CATH PLC W IMG  08/25/2024   IR US  GUIDE VASC ACCESS LEFT  08/25/2024   IR US  GUIDE VASC ACCESS RIGHT  08/25/2024   LAPAROSCOPIC APPENDECTOMY     MASTECTOMY Right    PORT-A-CATH REMOVAL Left 05/03/2023   Procedure: REMOVAL PORT-A-CATH;  Surgeon: Ebbie Cough, MD;  Location: [facility] OR;  Service: General;  Laterality: Left;   PORT-A-CATH REMOVAL Left 12/22/2023   Procedure: REMOVAL PORT-A-CATH;  Surgeon: Ebbie Cough, MD;  Location: [facility];  Service: General;  Laterality: Left;   PORTACATH PLACEMENT Left 10/14/2022   Procedure: INSERTION PORT-A-CATH;  Surgeon: Ebbie Cough, MD;  Location: [facility] OR;  Service: General;  Laterality: Left;   PORTACATH PLACEMENT Left 05/19/2023   Procedure: PORT PLACEMENT WITH ULTRASOUND GUIDANCE;  Surgeon: Ebbie Cough, MD;  Location: [facility];  Service: General;  Laterality: Left;   RE-EXCISION OF BREAST LUMPECTOMY Right 11/08/2022   Procedure: RE-EXCISION OF RIGHT BREAST LUMPECTOMY;  Surgeon: Ebbie Cough, MD;  Location: [facility];  Service: General;  Laterality: Right;   RE-EXCISION OF BREAST LUMPECTOMY Right 11/18/2022   Procedure: RE-EXCISION OF RIGHT BREAST LUMPECTOMY;  Surgeon: Ebbie Cough, MD;  Location: [facility] OR;  Service: General;  Laterality: Right;   SIMPLE MASTECTOMY WITH AXILLARY SENTINEL NODE BIOPSY Right 05/03/2023   Procedure: RIGHT  MASTECTOMY;  Surgeon: Ebbie Cough, MD;  Location: [facility] OR;  Service: General;  Laterality: Right;  GEN & PEC BLOCK   WISDOM TOOTH EXTRACTION      Social:  Lives With: Husband at home Support: Husband Level of Function: Independent in ADLs and IADLs at baseline Substances: -Tobacco: Denies -Alcohol: Denies -Recreational Drug: Denies  Family History:  Family History  Problem Relation Age of Onset   Thyroid  disease Mother    Prostate cancer Father 57       metastatic   Hypertension Father    Thyroid  disease Sister        twin sister age 18   Asthma Sister    Prostate cancer Brother 56   Heart attack Maternal Grandmother        died age 10's   Heart attack Maternal Grandfather        died age 43's   Clotting disorder Paternal Grandmother        cerebral hemorrage     Allergies: Allergies as of 10/04/2024 - Review Complete 10/04/2024  Allergen Reaction Noted  Penicillins Rash 11/30/2010    Review of Systems: A complete ROS was negative except as per HPI.   OBJECTIVE:   Physical Exam: Blood pressure 139/89, pulse 92, temperature 98 F (36.7 C), temperature source Oral, resp. rate 17, height 5' 3 (1.6 m), weight 60.3 kg, SpO2 95%.  Physical Exam Constitutional:      Appearance: She is ill-appearing.     Comments: Pale, Chronically ill-appearing female with significant cervical kyphosis  Cardiovascular:     Rate and Rhythm: Normal rate and regular rhythm.     Pulses: No decreased pulses.     Heart sounds: Normal heart sounds. No murmur heard.    No friction rub. No gallop.  Pulmonary:     Effort: Accessory muscle usage present. No respiratory distress.     Breath sounds: Decreased breath sounds (Left lower lung, right middle and lower lobes) present. No wheezing, rhonchi or rales.     Comments: Saturating 98% on 6 L nasal cannula, speaking in full sentences with appearance of difficulty, however does not need to take breaks between words.  Abdominal:      General: Bowel sounds are normal.     Palpations: Abdomen is soft.     Tenderness: There is no abdominal tenderness.  Musculoskeletal:     Right lower leg: Edema (1+) present.     Left lower leg: Edema (1+) present.     Comments: Right upper extremity swelling  Skin:    General: Skin is warm and dry.     Findings: Rash (Along right chest wall) present.  Neurological:     General: No focal deficit present.     Mental Status: She is alert.      Labs: CBC    Component Value Date/Time   WBC 18.2 (H) 10/04/2024 0902   RBC 3.24 (L) 10/04/2024 0902   HGB 10.9 (L) 10/04/2024 0914   HGB 11.2 (L) 08/20/2024 0825   HCT 32.0 (L) 10/04/2024 0914   PLT 805 (H) 10/04/2024 0902   PLT 440 (H) 08/20/2024 0825   MCV 101.9 (H) 10/04/2024 0902   MCH 31.8 10/04/2024 0902   MCHC 31.2 10/04/2024 0902   RDW 16.7 (H) 10/04/2024 0902   LYMPHSABS 0.6 (L) 10/04/2024 0902   MONOABS 1.7 (H) 10/04/2024 0902   EOSABS 0.0 10/04/2024 0902   BASOSABS 0.1 10/04/2024 0902     CMP     Component Value Date/Time   NA 137 10/04/2024 0914   K 4.8 10/04/2024 0914   CL 100 10/04/2024 0912   CO2 26 10/04/2024 0902   GLUCOSE 106 (H) 10/04/2024 0912   BUN 39 (H) 10/04/2024 0912   CREATININE 0.80 10/04/2024 0912   CREATININE 0.93 08/20/2024 0825   CALCIUM 9.1 10/04/2024 0902   PROT 6.5 10/04/2024 0902   ALBUMIN 3.2 (L) 10/04/2024 0902   AST 44 (H) 10/04/2024 0902   AST 22 08/20/2024 0825   ALT 11 10/04/2024 0902   ALT 13 08/20/2024 0825   ALKPHOS 116 10/04/2024 0902   BILITOT <0.2 10/04/2024 0902   BILITOT 0.3 08/20/2024 0825   GFRNONAA >60 10/04/2024 0902   GFRNONAA >60 08/20/2024 0825    Imaging: DG Chest Port 1 View Result Date: 10/04/2024 EXAM: 1 VIEW(S) XRAY OF THE CHEST 10/04/2024 08:56:00 AM COMPARISON: 10/02/2024 CLINICAL HISTORY: dyspnea FINDINGS: LINES, TUBES AND DEVICES: Left PleurX catheter stable in position. LUNGS AND PLEURA: Stable large loculated left pleural effusion. Increased  moderate right pleural effusion. Increased diffuse interstitial opacities and patchy opacities throughout left  lung and within right lower lung. No pneumothorax. HEART AND MEDIASTINUM: No acute abnormality of the cardiac and mediastinal silhouettes. BONES AND SOFT TISSUES: Stable dextrocurvature of thoracic spine. No acute osseous abnormality. IMPRESSION: 1. Increased diffuse interstitial and patchy opacities throughout the left lung and within the right lower lung. 2. Stable large loculated left pleural effusion and increased moderate right pleural effusion. Electronically signed by: Lonni Necessary MD 10/04/2024 10:00 AM EST RP Workstation: [id number]     EKG: personally reviewed my interpretation is sinus rhythm. Prior EKG sinus tachycardia.  ASSESSMENT & PLAN:   Assessment & Plan by Problem: Principal Problem:   Paroxysmal supraventricular tachycardia Active Problems:   Chronic anticoagulation   Community acquired pneumonia   Metastatic malignant neoplasm (HCC)   Caitlin [name] is a 69 y.o. person living with a history of PMH of Afib, end-stage triple negative breast cancer s/p R total mastectomy on palliative chemo, lymphedema R arm s/p radiation, Hypothyroidism, recent PE 08/25/2024 s/p thrombectomy on Eliquis , previous episodes SVT, BL malignant pleural effusions s/p left PleurX catheter, and chronic hypoxic respiratory failure on 2 to 3 L oxygen baseline, asthma who presented with SOB and chest tightness and admitted for community-acquired pneumonia and SVT on hospital day 0  Sepsis LLL pneumonia Bilateral malignant pleural effusion s/p PleurX catheter Chronic hypoxic respiratory failure on 2 to 3 L baseline Was diagnosed with left lower lobe pneumonia 2 days ago presented to the ED with tachycardia, hypotension, chest tightness.  Initial presentation of pneumonia included chills, cough, sweats. Previous treatment included Rocephin  and Zithromax .  She started to feel better until  chest pressure/increased oxygen requirement (SVT, see below).  Chest x-ray stable large loculated left pleural effusion, increased moderate right pleural effusion, increased diffuse interstitial patchy opacities in left lung.  Patient had thoracentesis recently that did not pull off much fluid.  Leukocytosis with neutrophil predominance and lactic acidosis (this could also be 2/2 SVT).  Received fluids (750 mL) and ceftriaxone  + azithromycin .  Allergy listed to Zosyn (childhood rash to penicillin).  Will continue with Zosyn in the setting. - Blood cultures pending - CT chest abdomen pelvis ordered - Doxycycline 100 mg every 12 hours for 5 doses - Zosyn per pharmacy consult - Wean oxygen as tolerated  SVT PAF Recent PE s/p thrombectomy on Eliquis  Patient has had 4 episodes of SVT s/p thrombectomy where she has needed previous cardioversion, adenosine, and has been unresponsive to vagal maneuvers.  These have all occurred within the past month.  Home medications include diltiazem  but has not been able to have dose increase due to hypotension.  Patient presented with tachycardia and SVT with complaints of chest tightness.  Cardioverted and received adenosine.  During her examination, she was in normal sinus rhythm with normal heart rate. Cardiology was consulted given the recurrent SVTs.  Appreciate cardiology recommendations - Cardiology recommendations for SVT:  - Stop diltiazem   - Start IV amiodarone load 5 g ordered  - Complete echocardiogram  - Maintain electrolytes  - Maintain telemetry - Continue Eliquis  - Troponins ordered - TTE  Metastatic breast cancer Full code Metastatic breast, triple negative cancer.  S/p multiple lumpectomies and partial right mastectomy.  Received chemo, radiation, immunotherapy.  Suffers unfortunately from malignant pleural effusions.  Missed palliative chemo due to the above problems.  Chief complaint is pain and shortness of breath.  Will resume her home  medications for pain.  - Palliative care consult placed - Oxycodone  5 mg every 6 hours as needed - Tylenol   500 mg every 4 as needed for pain  Peripheral edema Patient has lower extremity edema and right upper extremity edema.  The right upper extremity edema is due to s/p radiation/mastectomy/lumpectomy.  Cardiology is consulted for recurrent SVT and recommend Lasix 20 mg IV x 1. - Lasix 20 mg IV x 1 - Strict I's and O's  Constipation History of chronic constipation - MiraLAX  ordered  Macrocytic anemia  Thrombocytosis Hemoglobin 10.3, appears stable from baseline.  MCV 101.9, HCT 33, platelets 805. - B12 ordered - Folate ordered  #Chronic medical conditions Hypothyroidism - Continue Synthroid  100 mcg daily  Best practice: Diet: Normal VTE: Eliquis  IVF: None,None Code: Full  Disposition planning: Prior to Admission Living Arrangement: Home, living with husband Anticipated Discharge Location: Home  Dispo: Admit patient to Observation with expected length of stay less than 2 midnights.  SignedBETHA Benuel Braun, DO Internal Medicine Resident  10/04/2024, 2:37 PM  On Call pager: [phone]      [1]  Current Meds  Medication Sig   acetaminophen  (TYLENOL ) 500 MG tablet Take 500 mg by mouth every 6 (six) hours as needed for moderate pain (pain score 4-6).   albuterol  (VENTOLIN  HFA) 108 (90 Base) MCG/ACT inhaler Inhale 2 puffs into the lungs every 4 (four) hours as needed for shortness of breath (Asthma).   APIXABAN  (ELIQUIS ) VTE STARTER PACK (10MG  AND 5MG ) Take as directed on package: start with two-5mg  tablets twice daily for 7 days. On day 8, switch to one-5mg  tablet twice daily. (Patient taking differently: Take 5 mg by mouth 2 (two) times daily.)   azithromycin  (ZITHROMAX ) 250 MG tablet Take 1 tablet (250 mg total) by mouth daily. Take 1 tablet daily starting 12/31   cefTRIAXone  (ROCEPHIN ) 1 g injection Inject 1 g into the muscle once.   dexamethasone  (DECADRON ) 4 MG tablet  Take 1 tablets  by mouth daily for 3 days starting the day after chemotherapy. Take with food.   diltiazem  (CARDIZEM  CD) 120 MG 24 hr capsule Take 120 mg by mouth 2 (two) times daily.   diltiazem  (CARDIZEM ) 30 MG tablet Take 30 mg by mouth 3 (three) times daily as needed (Tachycardia).   docusate sodium  (COLACE) 100 MG capsule Take 100 mg by mouth 2 (two) times daily as needed for mild constipation.   doxycycline (VIBRAMYCIN) 100 MG capsule Take 100 mg by mouth once.   furosemide (LASIX) 40 MG tablet Take 20 mg by mouth daily.   levothyroxine  (SYNTHROID ) 100 MCG tablet Take 100 mcg by mouth daily.   lidocaine -prilocaine  (EMLA ) cream Apply to affected area once   magnesium citrate SOLN Take 1 Bottle by mouth as needed for severe constipation or moderate constipation.   ondansetron  (ZOFRAN ) 8 MG tablet Take 1 tablet (8 mg total) by mouth every 8 (eight) hours as needed for nausea or vomiting. Start on the third day after chemotherapy.   OVER THE COUNTER MEDICATION Take 1 capsule by mouth 2 (two) times daily. copaiba softgels   oxyCODONE  (OXY IR/ROXICODONE ) 5 MG immediate release tablet Take 1 tablet (5 mg total) by mouth every 6 (six) hours as needed for severe pain (pain score 7-10). (Patient taking differently: Take 2.5-5 mg by mouth every 4 (four) hours as needed for severe pain (pain score 7-10).)   polyethylene glycol (MIRALAX  / GLYCOLAX ) 17 g packet Take 17 g by mouth daily as needed for moderate constipation.   prochlorperazine  (COMPAZINE ) 10 MG tablet Take 1 tablet (10 mg total) by mouth every 6 (six) hours as needed  for nausea or vomiting.   PULMICORT  FLEXHALER 180 MCG/ACT inhaler Inhale 1 puff into the lungs daily.   [DISCONTINUED] Multiple Vitamins-Minerals (MULTIVITAMIN WITH MINERALS) tablet Take 1 tablet by mouth daily.   "

## 2024-10-04 NOTE — ED Notes (Signed)
 RT at bedside- patient placed on 15L NRB. EDP at bedside.

## 2024-10-04 NOTE — Sepsis Progress Note (Signed)
 Sepsis protocol is being followed by [facility].

## 2024-10-04 NOTE — ED Notes (Addendum)
 Spoke with dr letha regarding critical blood gas. RRT called to bedside to start Bipap will obtain new VBG after bipap has been on for at least 30 mins

## 2024-10-04 NOTE — Progress Notes (Addendum)
 Alerted by RN of critical VBG result: pH of 7.16, pCO2 of 98, bicarb of 34.9. 1 hour prior to result, RN informed night team that patient was sleepy but responding to questions appropriately. At that time the patient was on 5L humidified O2 via [place] and saturating 90-94%. Per chart review, she wears 2-3L [place] with saturation 88-90% at baseline.  No family at bedside when we evaluated the patient at bedside. She was unable to provide much history due to falling asleep in between questioning.   Physical Exam: Constitutional: ill-appearing, somnolent Eyes: conjunctiva non-erythematous, PERRL, no scleral icterus Cardiovascular: regular rate and rhythm, no m/r/g Pulmonary/Chest: increased work of breathing on 5L humidified [place], anterior lung fields clear to auscultation bilaterally Neurological: alert & oriented to name but not place or year; did not respond to commands. Constantly falling asleep in between questions. Skin: warm and dry   Assessment and Plan:  [name] previous desaturation with increased oxygen requirement around 1900 prompted the VBG which is showing respiratory acidosis with early evidence of metabolic compensation. Do not see history of any chronic disease process contributing to CO2 retention which suggests acute decompensation. Exam showing altered mentation as patient is A&O x1, not responding to commands, and somnolent. Suspect that patient's acute worsening is related to pneumonia and malignant pleural effusions. Will trial BiPAP and time ABG for after 30 mins to gauge improvement in hypercarbic respiratory failure. If unsuccessful, will consult PCCM for further recommendations regarding respiratory distress and possible intubation needs.  Plan: - Transfer order placed for progressive floor - Will place patient on BiPAP with timed ABG for 30 mins later - Low threshold to consult PCCM if no clinical/laboratory improvement  Called patient's husband Caitlin [name], [phone]) for  update and to notify him that patient may require intubation and transfer to ICU. He acknowledged and stated that he will be coming to the hospital.  Signed,  Letha Cheadle, MD Internal Medicine Residency  PGY-1 [phone]; 10/04/2024  Addendum  ABG was unable to be collected due to RT tending to trauma case. Repeated VBG which showed minimal improvement (pH 7.17, pCO2 89). ABG finally completed at 0040 which showed pH of 7.252 and pCO2 of 75.1.  Evaluated patient, husband at bedside. No change in mental status but patient on BIPAP machine. Per husband, patient has kyphoscoliosis and likes to rest with chin tucked and head leaning forward for comfort.  Reviewed CXR which showed known malignant pleural effusion on right. She is s/p pleurX on left but would likely benefit from this bilaterally as it will continue to re-accumulate. Discussed case with PCCM who also evaluated patient and suggested that lack of improvement likely due to inadequate respiratory rate and minute ventilation. Also suggested that scoliosis and head position were contributing to poor ventilation. PCCM adjusted BiPAP settings and recommended repeating ABG in 1 hour. Husband helped reposition patient's head back to open airway for improved ventilation. We have also added ipratropium nebulizer q6h for 2 doses. If the patient does not improve, may require intubation. As of now, patient is still full code with intubation if necessary. Discussed with patient's husband who states that as of now, the patient would like to live as long as possible because she is expecting the birth of her granddaughter this month.  Plan: - Continue BiPAP - Repeat ABG in 1 hour - Nebulized ipratropium 0.5 mg q6h x 2 doses  Caitlin Meeker, MD Internal Medicine Residency  PGY-1 [phone]; 10/05/2024

## 2024-10-04 NOTE — ED Notes (Addendum)
 RT called to come look at patient as patient started dropping to 88% saturation on 10L with pulmicort .

## 2024-10-04 NOTE — ED Notes (Signed)
 RN put pt on 6l OF O2. Pts room air was 88%. MD at bedside.

## 2024-10-04 NOTE — Hospital Course (Addendum)
 Caitlin [name] is a 69 y.o. 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 S/p multiple lumpectomies and partial right mastectomy. Received chemo, radiation, immunotherapy. Suffers unfortunately from malignant pleural effusions. Missed palliative chemo due to the above problems.   -- Appreciate palliative care assistance per meeting today  -- NO BIPAP (patient does not want to go on Bipap) if respiratory decline > notify Husband and transition to comfort focus   -- [facility] > need to verify if they will enroll with palliative chemo    -- If yes then will need DME hospital bed, wheelchair, and BSC.     -- PT/OT orders for DME needs   -- R sided Pleurx planned tomorrow, does not need to be NPO  - Tylenol  500 mg every 4 as needed for pain -- Toradol  15 mg every 6 hours as needed for pain control  Sepsis  lactic acidosis  respiratory acidosis w/ metabolic alkalosis compensation > improved  leukocytosis Bilateral lobe pneumonia L > R  Recurrent bilateral malignant pleural effusions status post L PleurX catheter 11/25 Acute on Chronic hypoxic respiratory failure on 2 to 3 L oxygen at baseline S/p thoracentesis R pleural effusion with 1.3 L dark amber fluid drained and sent for pleural studies.  R Pleural studies showed total nucleated cell count 1538 (5% neutrophilic count) > cultures without any growth 1/2: ABG > pH 7.14, pCO2 93, pO2 113, bicarb 31.7; patient was placed on BiPAP, repeat ABG showed pH 7.4, pCO2 49, pO2 129, bicarb 34.1 Lactic acid 2.2 > 2.1 On evaluation this morning, on 2 L, breathing comfortably, no apparent  respiratory distress -Follow-up pleural studies, calculate SAAG -Continue Zosyn  day 4, [completed 2 days of Azithro and 3 days of Doxy]  -Albuterol  as needed -Pulmicort  daily -R Pleurx catheter placement tomorrow by PCCM   -Appreciate PCCM recommendations   PAF, now NSR  SVT Recent PE s/p thrombectomy on Eliquis  Rates 90s-110s, NSR, on Amio PO taper dose Continue Eliquis  5 mg twice daily Magnesium 2.2, potassium 4  Right upper extremity edema 2/2 radiation/mastectomy/lumpectomy  Present, mainly lymphedema  Chronic constipation Reports last bowel movement 5 days ago. Placed on Senokot twice daily, MiraLAX  daily  Normocytic anemia/anemia of chronic disease Thrombocytosis Hemoglobin 9.8, appears stable from baseline.  No acute signs or symptoms of bleed.   Hypothyroidism - Continue Synthroid  100 mcg daily  =============================================

## 2024-10-04 NOTE — Progress Notes (Incomplete)
 Internal Medicine Teaching Service Attending Note Date: 10/04/2024   Patient name: Caitlin [name]      Medical record number: [id number]            Date of birth: [birth year]         I have seen and evaluated Caitlin [name] and discussed their care with the Residency Team.   69 yo F with hx of metastatic breast (triple negative) cancer- she was dx 27 months ago and received a partial mastectomy, CTX, XRT and immunuotherapy. She was felt to be in remission feb 11-2023 however by May she was noted to have recurrence. She has been on palliative immunotherapy since. Her course is further complicated by maligianant pleural effusions, which have required pleurex catheters.  Her last thoracentesis was 12-24. She is on 2L home O2. She had PE (6 weeks ago), and her course was complicated by SVT after thrombectomy. She reuquired cardioverson.  She has since had recurrent episodes of SVT.   She was seen in ED 12-29 for worsening SOB, sweats and cough. She left prior to be being seen. She was seen 12-30 with the same and desaturations, was found on CXR to have LLL PNA and went home with husband (PCP) who administered azithro and IM cefttriaxone. She felt better o/n and was able to decrease her home O2 from 5L to 4L.  She received a second dose today, and then developed chest tightness and SVT at home. She came to ED and received adenosine and converted.      Hx provided by her husband who is a physician.  Physical Exam: Blood pressure 139/89, pulse 92, temperature 98 F (36.7 C), temperature source Oral, resp. rate 17, height 5' 3 (1.6 m), weight 60.3 kg, SpO2 95%. General appearance: alert, cooperative, and moderate distress Eyes: negative findings: pupils equal, round, reactive to light and accomodation Throat: normal findings: oropharynx pink & moist without lesions or evidence of thrush Neck: no adenopathy and supple, symmetrical, trachea midline Resp: diminished breath sounds RLL and rales  bibasilar Cardio: regular rate and rhythm GI: normal findings: bowel sounds normal and soft, non-tender Extremities: edema 2-3+ BLE, RUE edema as well   Lab results: Lab Results Last 24 Hours       Results for orders placed or performed during the hospital encounter of 10/04/24 (from the past 24 hours)  CBC with Differential     Status: Abnormal    Collection Time: 10/04/24  9:02 AM  Result Value Ref Range    WBC 18.2 (H) 4.0 - 10.5 K/uL    RBC 3.24 (L) 3.87 - 5.11 MIL/uL    Hemoglobin 10.3 (L) 12.0 - 15.0 g/dL    HCT 66.9 (L) 63.9 - 46.0 %    MCV 101.9 (H) 80.0 - 100.0 fL    MCH 31.8 26.0 - 34.0 pg    MCHC 31.2 30.0 - 36.0 g/dL    RDW 83.2 (H) 88.4 - 15.5 %    Platelets 805 (H) 150 - 400 K/uL    nRBC 0.0 0.0 - 0.2 %    Neutrophils Relative % 86 %    Neutro Abs 15.5 (H) 1.7 - 7.7 K/uL    Lymphocytes Relative 3 %    Lymphs Abs 0.6 (L) 0.7 - 4.0 K/uL    Monocytes Relative 9 %    Monocytes Absolute 1.7 (H) 0.1 - 1.0 K/uL    Eosinophils Relative 0 %    Eosinophils Absolute 0.0 0.0 - 0.5 K/uL  Basophils Relative 0 %    Basophils Absolute 0.1 0.0 - 0.1 K/uL    Immature Granulocytes 2 %    Abs Immature Granulocytes 0.36 (H) 0.00 - 0.07 K/uL  Comprehensive metabolic panel     Status: Abnormal    Collection Time: 10/04/24  9:02 AM  Result Value Ref Range    Sodium 139 135 - 145 mmol/L    Potassium 4.9 3.5 - 5.1 mmol/L    Chloride 100 98 - 111 mmol/L    CO2 26 22 - 32 mmol/L    Glucose, Bld 106 (H) 70 - 99 mg/dL    BUN 32 (H) 8 - 23 mg/dL    Creatinine, Ser 9.35 0.44 - 1.00 mg/dL    Calcium 9.1 8.9 - 89.6 mg/dL    Total Protein 6.5 6.5 - 8.1 g/dL    Albumin 3.2 (L) 3.5 - 5.0 g/dL    AST 44 (H) 15 - 41 U/L    ALT 11 0 - 44 U/L    Alkaline Phosphatase 116 38 - 126 U/L    Total Bilirubin <0.2 0.0 - 1.2 mg/dL    GFR, Estimated >39 >39 mL/min    Anion gap 13 5 - 15  I-stat chem 8, ED (not at [facility], [facility] or [facility])     Status: Abnormal    Collection Time: 10/04/24  9:12 AM  Result  Value Ref Range    Sodium 137 135 - 145 mmol/L    Potassium 4.7 3.5 - 5.1 mmol/L    Chloride 100 98 - 111 mmol/L    BUN 39 (H) 8 - 23 mg/dL    Creatinine, Ser 9.19 0.44 - 1.00 mg/dL    Glucose, Bld 893 (H) 70 - 99 mg/dL    Calcium, Ion 8.87 (L) 1.15 - 1.40 mmol/L    TCO2 28 22 - 32 mmol/L    Hemoglobin 10.9 (L) 12.0 - 15.0 g/dL    HCT 67.9 (L) 63.9 - 46.0 %  I-Stat venous blood gas, ([facility] ED, [facility], [facility])     Status: Abnormal    Collection Time: 10/04/24  9:14 AM  Result Value Ref Range    pH, Ven 7.371 7.25 - 7.43    pCO2, Ven 53.4 44 - 60 mmHg    pO2, Ven 79 (H) 32 - 45 mmHg    Bicarbonate 31.0 (H) 20.0 - 28.0 mmol/L    TCO2 33 (H) 22 - 32 mmol/L    O2 Saturation 95 %    Acid-Base Excess 5.0 (H) 0.0 - 2.0 mmol/L    Sodium 137 135 - 145 mmol/L    Potassium 4.8 3.5 - 5.1 mmol/L    Calcium, Ion 1.13 (L) 1.15 - 1.40 mmol/L    HCT 32.0 (L) 36.0 - 46.0 %    Hemoglobin 10.9 (L) 12.0 - 15.0 g/dL    Sample type VENOUS    I-Stat CG4 Lactic Acid     Status: Abnormal    Collection Time: 10/04/24  9:16 AM  Result Value Ref Range    Lactic Acid, Venous 2.7 (HH) 0.5 - 1.9 mmol/L    Comment NOTIFIED PHYSICIAN          Imaging results:  Imaging Results (Last 48 hours)  DG Chest Port 1 View Result Date: 10/04/2024 EXAM: 1 VIEW(S) XRAY OF THE CHEST 10/04/2024 08:56:00 AM COMPARISON: 10/02/2024 CLINICAL HISTORY: dyspnea FINDINGS: LINES, TUBES AND DEVICES: Left PleurX catheter stable in position. LUNGS AND PLEURA: Stable large loculated left pleural effusion. Increased moderate right  pleural effusion. Increased diffuse interstitial opacities and patchy opacities throughout left lung and within right lower lung. No pneumothorax. HEART AND MEDIASTINUM: No acute abnormality of the cardiac and mediastinal silhouettes. BONES AND SOFT TISSUES: Stable dextrocurvature of thoracic spine. No acute osseous abnormality. IMPRESSION: 1. Increased diffuse interstitial and patchy opacities throughout the left lung and  within the right lower lung. 2. Stable large loculated left pleural effusion and increased moderate right pleural effusion. Electronically signed by: Lonni Necessary MD 10/04/2024 10:00 AM EST RP Workstation: [id number]        Assessment and Plan: I agree with the formulated Assessment and Plan with the following changes:    Pneumonia Will change her anbx to zosyn and doxy. If not improving will add zyvox. Check RVP (-) on 12-30, will not repeat.  Pleural effusion Will consider pulm eval if SOB not improving.    SVT Will have her seen by [facility] New BBB since 08-2024. Check troponins Check TTE  Metastatic breast cancer Will have her seen by palliative care At this point pt wishes to remain full code.   Eben Reyes BROCKS, MD

## 2024-10-04 NOTE — Progress Notes (Signed)
*  PRELIMINARY RESULTS* Echocardiogram 2D Echocardiogram has been performed.  Caitlin [name] 10/04/2024, 5:18 PM

## 2024-10-04 NOTE — ED Notes (Signed)
 Patient placed on 5L [place] humidified and maintaining 90-91% saturation. Which is her normal.

## 2024-10-04 NOTE — Consult Note (Signed)
 "  Cardiology Consultation   Patient ID: Caitlin [name] MRN: [id number]; DOB: 11-Nov-1955  Admit date: 10/04/2024 Date of Consult: 10/04/2024  PCP:  Toribio Jerel MATSU, MD   [facility] Providers Cardiologist:  None        Patient Profile: JOHNANNA [name] is a 69 y.o. 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 Eben.  History of Present Illness: Caitlin [name] unfortunately has been very ill over the past several months.  She was hospitalized at [facility] in November after developing a massive pulmonary embolus requiring thrombectomy by IR on 08/25/2024.  She was also found to have left-sided pleural effusion and underwent a PleurX catheter placement during that admission.  During her hospitalization she developed SVT during thrombectomy that required an emergent cardioversion.  She was already on diltiazem  for atrial fibrillation management which was continued as an outpatient.  1 week after being discharged from the hospital the patient presented to [facility] with recurrence of SVT.  She was tachycardic and became dizzy and despite vagal maneuvers her SVT persisted.  She received adenosine with successful conversion to NSR.  She was ultimately discharged home with her dose of Cardizem  increased to 120 mg twice daily.  Unfortunately she has been intolerant to higher doses of diltiazem  due to low blood pressure.  This past week the patient went back into SVT and her husband called EMS.  EMS was able to place an IV and gave her adenosine with successful conversion back to NSR.  She did not present to the ED for this episode.  Today, the patient was  in her usual state of health when she developed palpitations, dizziness, and chest pressure and was found to be tachycardic to the 170s.  Her husband gave her an as needed diltiazem  but her tachycardia persisted.  EMS arrived and she was noted to be severely hypotensive with systolic of 74 and hypoxic to 19%.  She was given IV fluid and brought to the ED for management.  She was given 6 mg of IV adenosine with conversion to NSR.  The patient has very aggressive metastatic breast cancer.  She has had multiple lumpectomies in the past and ultimately a right mastectomy for recurrence.  The cancer has spread to lymph nodes, bone, the pleura, and skin.  She is on palliative chemotherapy largely for improvement of symptoms, but unfortunately her malignancy will be terminal.  No prior history of SVT before her recent hospitalizations.  Most recently she presented to the ED on 12/30 for increased shortness of breath and was diagnosed with LLL pneumonia.  There were no beds available so the patient's husband (who is a family medicine physician) elected to do outpatient treatment of her pneumonia and the left AMA.  She has been on IM ceftriaxone  and doxycycline since then.  In the ED her VS were afebrile, HR 150, BP 114/74 (lowest 84/54), RR 21 and satting 92% on 4-5L.  Labs notable for WBC 18.2, hemoglobin 10.3, platelets 805, normal creatinine, lactate 2.7.  CXR demonstrated increased diffuse interstitial and patchy opacities throughout the left lung and within the right lower lung as well as loculated left pleural effusion and increased moderate right pleural  effusion.  EKG demonstrates SVT.  The patient currently denies having palpitations or chest pain but has ongoing shortness of breath.  Cardiology is consulted for evaluation of SVT.   Past Medical History:  Diagnosis Date   Asthma    Breast cancer (HCC)    right   Complication of anesthesia    Dyspnea    Dysrhythmia    2012   Headache    History of  kidney stones    passed   Hypothyroidism    Pneumonia 01/26/2023   PONV (postoperative nausea and vomiting)    rt breast ca 08/2022   Right Breast   Scoliosis    Thyroid  disease     Past Surgical History:  Procedure Laterality Date   APPENDECTOMY     AXILLARY SENTINEL NODE BIOPSY Right 10/14/2022   Procedure: RIGHT AXILLARY SENTINEL NODE BIOPSY;  Surgeon: Ebbie Cough, MD;  Location: [facility] OR;  Service: General;  Laterality: Right;   BREAST BIOPSY Right 09/15/2022   US  RT BREAST BX W LOC DEV 1ST LESION IMG BX SPEC US  GUIDE 09/15/2022 [facility]   BREAST BIOPSY  10/12/2022   US  RT RADIOACTIVE SEED LOC 10/12/2022 [facility]   BREAST LUMPECTOMY WITH RADIOACTIVE SEED AND SENTINEL LYMPH NODE BIOPSY Right 10/14/2022   Procedure: RIGHT BREAST LUMPECTOMY WITH RADIOACTIVE SEED;  Surgeon: Ebbie Cough, MD;  Location: [facility] OR;  Service: General;  Laterality: Right;   COLONOSCOPY     IR PERC PLEURAL DRAIN W/INDWELL CATH W/IMG GUIDE  08/28/2024   IR RADIOLOGIST EVAL & MGMT  09/26/2024   IR THORACENTESIS ASP PLEURAL SPACE W/IMG GUIDE  09/07/2024   IR THORACENTESIS ASP PLEURAL SPACE W/IMG GUIDE  09/26/2024   IR THROMBECT PRIM MECH INIT (INCLU) MOD SED  08/25/2024   IR TUNNELED CENTRAL VENOUS CATH PLC W IMG  08/25/2024   IR US  GUIDE VASC ACCESS LEFT  08/25/2024   IR US  GUIDE VASC ACCESS RIGHT  08/25/2024   LAPAROSCOPIC APPENDECTOMY     MASTECTOMY Right    PORT-A-CATH REMOVAL Left 05/03/2023   Procedure: REMOVAL PORT-A-CATH;  Surgeon: Ebbie Cough, MD;  Location: [facility] OR;  Service: General;  Laterality: Left;   PORT-A-CATH REMOVAL Left 12/22/2023   Procedure: REMOVAL PORT-A-CATH;  Surgeon: Ebbie Cough, MD;  Location: [facility];  Service: General;  Laterality: Left;   PORTACATH PLACEMENT Left 10/14/2022   Procedure: INSERTION PORT-A-CATH;  Surgeon: Ebbie Cough, MD;  Location: [facility] OR;  Service: General;  Laterality: Left;   PORTACATH PLACEMENT  Left 05/19/2023   Procedure: PORT PLACEMENT WITH ULTRASOUND GUIDANCE;  Surgeon: Ebbie Cough, MD;  Location: [facility];  Service: General;  Laterality: Left;   RE-EXCISION OF BREAST LUMPECTOMY Right 11/08/2022   Procedure: RE-EXCISION OF RIGHT BREAST LUMPECTOMY;  Surgeon: Ebbie Cough, MD;  Location: [facility];  Service: General;  Laterality: Right;   RE-EXCISION OF BREAST LUMPECTOMY Right 11/18/2022   Procedure: RE-EXCISION OF RIGHT BREAST LUMPECTOMY;  Surgeon: Ebbie Cough, MD;  Location: [facility] OR;  Service: General;  Laterality: Right;   SIMPLE MASTECTOMY WITH AXILLARY SENTINEL NODE BIOPSY Right 05/03/2023   Procedure: RIGHT MASTECTOMY;  Surgeon: Ebbie Cough, MD;  Location: [facility] OR;  Service: General;  Laterality: Right;  GEN & PEC BLOCK   WISDOM TOOTH EXTRACTION       Home Medications:  Prior to Admission medications  Medication Sig Start Date End Date Taking? Authorizing Provider  albuterol  (VENTOLIN  HFA) 108 (90 Base) MCG/ACT inhaler Inhale 2 puffs into the lungs  every 4 (four) hours as needed for shortness of breath (Asthma). 05/30/22   [provider]  alendronate  (FOSAMAX ) 70 MG tablet Take 1 tablet (70 mg total) by mouth once a week. Take with a full glass of water  on an empty stomach. 12/21/23   Gudena, Vinay, MD  APIXABAN  (ELIQUIS ) VTE STARTER PACK (10MG  AND 5MG ) Take as directed on package: start with two-5mg  tablets twice daily for 7 days. On day 8, switch to one-5mg  tablet twice daily. 08/29/24   Cheryle Page, MD  azithromycin  (ZITHROMAX ) 250 MG tablet Take 1 tablet (250 mg total) by mouth daily. Take 1 tablet daily starting 12/31 10/02/24   Rogelia Jerilynn RAMAN, MD  cefdinir (OMNICEF) 300 MG capsule Take 1 capsule (300 mg total) by mouth 2 (two) times daily for 7 days. 10/02/24 10/09/24  Rogelia Jerilynn RAMAN, MD  cetirizine (ZYRTEC) 10 MG tablet Take 10 mg by mouth daily.    [provider]  dexamethasone  (DECADRON ) 4  MG tablet Take 1 tablets  by mouth daily for 3 days starting the day after chemotherapy. Take with food. 07/25/24   Gudena, Vinay, MD  diltiazem  (CARDIZEM  CD) 120 MG 24 hr capsule Take 120 mg by mouth daily.    [provider]  diltiazem  (CARDIZEM ) 30 MG tablet Take 30 mg by mouth 3 (three) times daily. 09/25/24   [provider]  furosemide (LASIX) 40 MG tablet Take 40 mg by mouth daily as needed. 09/11/24   [provider]  levothyroxine  (SYNTHROID ) 100 MCG tablet Take 100 mcg by mouth daily. 09/03/24   [provider]  lidocaine -prilocaine  (EMLA ) cream Apply to affected area once 07/25/24   Gudena, Vinay, MD  Multiple Vitamins-Minerals (MULTIVITAMIN WITH MINERALS) tablet Take 1 tablet by mouth daily.    [provider]  ondansetron  (ZOFRAN ) 8 MG tablet Take 1 tablet (8 mg total) by mouth every 8 (eight) hours as needed for nausea or vomiting. Start on the third day after chemotherapy. 07/25/24   Odean Potts, MD  OVER THE COUNTER MEDICATION Take 1 capsule by mouth 2 (two) times daily. copaiba softgels    [provider]  oxyCODONE  (OXY IR/ROXICODONE ) 5 MG immediate release tablet Take 1 tablet (5 mg total) by mouth every 6 (six) hours as needed for severe pain (pain score 7-10). 07/30/24   Gudena, Vinay, MD  polyethylene glycol (MIRALAX  / GLYCOLAX ) 17 g packet Take 17 g by mouth daily as needed for moderate constipation. 08/29/24   Cheryle Page, MD  prochlorperazine  (COMPAZINE ) 10 MG tablet Take 1 tablet (10 mg total) by mouth every 6 (six) hours as needed for nausea or vomiting. 09/05/24   Gudena, Vinay, MD  PULMICORT  FLEXHALER 180 MCG/ACT inhaler Inhale 1 puff into the lungs daily.    [provider]    Scheduled Meds:  apixaban   5 mg Oral BID   doxycycline  100 mg Oral Q12H   levothyroxine   100 mcg Oral Q0600   Continuous Infusions:  [START ON 10/05/2024] cefTRIAXone  (ROCEPHIN )  IV     PRN Meds: acetaminophen  **OR**  acetaminophen , oxyCODONE , polyethylene glycol  Allergies:   Allergies[1]  Social History:   Social History   Socioeconomic History   Marital status: Married    Spouse name: Not on file   Number of children: Not on file   Years of education: Not on file   Highest education level: Not on file  Occupational History   Not on file  Tobacco Use   Smoking status: Never  Smokeless tobacco: Not on file  Vaping Use   Vaping status: Never Used  Substance and Sexual Activity   Alcohol use: Not Currently   Drug use: Never   Sexual activity: Not Currently  Other Topics Concern   Not on file  Social History Narrative   Not on file   Social Drivers of Health   Tobacco Use: Unknown (10/04/2024)   Patient History    Smoking Tobacco Use: Never    Smokeless Tobacco Use: Unknown    Passive Exposure: Not on file  Financial Resource Strain: Low Risk (09/03/2024)   Received from [facility]   Overall Financial Resource Strain (CARDIA)    How hard is it for you to pay for the very basics like food, housing, medical care, and heating?: Not hard at all  Food Insecurity: No Food Insecurity (09/03/2024)   Received from [facility]   Epic    Within the past 12 months, you worried that your food would run out before you got the money to buy more.: Never true    Within the past 12 months, the food you bought just didn't last and you didn't have money to get more.: Never true  Transportation Needs: No Transportation Needs (09/03/2024)   Received from [facility] - Transportation    Lack of Transportation (Medical): No    Lack of Transportation (Non-Medical): No  Physical Activity: Inactive (09/03/2024)   Received from [facility]   Exercise Vital Sign    On average, how many days per week do you engage in moderate to strenuous exercise (like a brisk walk)?: 0 days    On average, how many minutes do you engage in exercise at this level?: 0 min  Stress: Stress Concern Present  (09/03/2024)   Received from [facility] - Occupational Stress Questionnaire    Do you feel stress - tense, restless, nervous, or anxious, or unable to sleep at night because your mind is troubled all the time - these days?: To some extent  Social Connections: Socially Integrated (09/03/2024)   Received from [facility]   Social Connection and Isolation Panel    In a typical week, how many times do you talk on the phone with family, friends, or neighbors?: More than three times a week    How often do you get together with friends or relatives?: More than three times a week    How often do you attend church or religious services?: More than 4 times per year    Do you belong to any clubs or organizations such as church groups, unions, fraternal or athletic groups, or school groups?: Yes    How often do you attend meetings of the clubs or organizations you belong to?: More than 4 times per year    Are you married, widowed, divorced, separated, never married, or living with a partner?: Married  Intimate Partner Violence: Not At Risk (08/26/2024)   Epic    Fear of Current or Ex-Partner: No    Emotionally Abused: No    Physically Abused: No    Sexually Abused: No  Depression (PHQ2-9): Low Risk (05/27/2023)   Depression (PHQ2-9)    PHQ-2 Score: 0  Alcohol Screen: Low Risk (05/27/2023)   Alcohol Screen    Last Alcohol Screening Score (AUDIT): 0  Housing: Low Risk (08/26/2024)   Epic    Unable to Pay for Housing in the Last Year:  No    Number of Times Moved in the Last Year: 0    Homeless in the Last Year: No  Utilities: Not At Risk (08/26/2024)   Epic    Threatened with loss of utilities: No  Health Literacy: Low Risk (11/12/2022)   Received from [facility] Literacy    How often do you need to have someone help you when you read instructions, pamphlets, or other written material from your doctor or pharmacy?: Never    Family  History:    Family History  Problem Relation Age of Onset   Thyroid  disease Mother    Prostate cancer Father 53       metastatic   Hypertension Father    Thyroid  disease Sister        twin sister age 91   Asthma Sister    Prostate cancer Brother 18   Heart attack Maternal Grandmother        died age 79's   Heart attack Maternal Grandfather        died age 12's   Clotting disorder Paternal Grandmother        cerebral hemorrage     ROS:  Please see the history of present illness.   All other ROS reviewed and negative.     Physical Exam/Data: Vitals:   10/04/24 0955 10/04/24 1000 10/04/24 1004 10/04/24 1015  BP: (!) 161/77 133/88  139/89  Pulse: 89 86  92  Resp: (!) 21 20  17   Temp:   98 F (36.7 C)   TempSrc:   Oral   SpO2: 93% 95%  95%  Weight:      Height:        Intake/Output Summary (Last 24 hours) at 10/04/2024 1226 Last data filed at 10/04/2024 1130 Gross per 24 hour  Intake 248.53 ml  Output --  Net 248.53 ml      10/04/2024    8:10 AM 09/07/2024    9:22 AM 09/05/2024    9:26 AM  Last 3 Weights  Weight (lbs) 133 lb 140 lb 8 oz 139 lb 8 oz  Weight (kg) 60.328 kg 63.73 kg 63.277 kg     Body mass index is 23.56 kg/m.  General: Chronically ill-appearing female in NAD HEENT: normal Neck: Mild distention of the external jugular Vascular: No carotid bruits; Distal pulses 2+ bilaterally Cardiac:  normal S1, S2; RRR; no murmur, rubs or gallops Lungs: Decreased breath sounds in the bases, no wheezes or rhonchi Abd: soft, nontender, abdomen feels distended, normal BS Ext: 1+ pitting edema bilaterally in the lower extremities and right upper extremity Musculoskeletal:  No gross deformities Skin: Erythematous/pustular rash overlying right mastectomy site from malignancy Neuro:  CNs 2-12 intact, no focal abnormalities noted Psych: Appears very fatigued  EKG:  The EKG was personally reviewed and demonstrates: 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 Left ventricular ejection fraction, by estimation, is 60 to 65%. The  left ventricle has normal function. There is mild left ventricular  hypertrophy.   2. Right ventricular systolic function is mildly reduced. The right  ventricular size is mildly enlarged. There is normal pulmonary artery  systolic pressure. The estimated right ventricular systolic pressure is  28.8 mmHg.   3. The aortic valve was not well visualized.   4. The inferior vena cava is normal in size with <  50% respiratory  variability, suggesting right atrial pressure of 8 mmHg.   Laboratory Data: High Sensitivity Troponin:  No results for input(s): TROPONINIHS in the last 720 hours. No results for input(s): TRNPT in the last 720 hours.    Chemistry Recent Labs  Lab 10/02/24 0656 10/04/24 0902 10/04/24 0912 10/04/24 0914  NA 137 139 137 137  K 4.0 4.9 4.7 4.8  CL 97* 100 100  --   CO2 25 26  --   --   GLUCOSE 106* 106* 106*  --   BUN 26* 32* 39*  --   CREATININE 0.66 0.64 0.80  --   CALCIUM 9.1 9.1  --   --   GFRNONAA >60 >60  --   --   ANIONGAP 14 13  --   --     Recent Labs  Lab 10/02/24 0656 10/04/24 0902  PROT 6.4* 6.5  ALBUMIN 3.1* 3.2*  AST 50* 44*  ALT 10 11  ALKPHOS 120 116  BILITOT 0.3 <0.2   Lipids No results for input(s): CHOL, TRIG, HDL, LABVLDL, LDLCALC, CHOLHDL in the last 168 hours.  Hematology Recent Labs  Lab 10/02/24 9343 10/04/24 0902 10/04/24 0912 10/04/24 0914  WBC 12.8* 18.2*  --   --   RBC 3.34* 3.24*  --   --   HGB 10.3* 10.3* 10.9* 10.9*  HCT 33.2* 33.0* 32.0* 32.0*  MCV 99.4 101.9*  --   --   MCH 30.8 31.8  --   --   MCHC 31.0 31.2  --   --   RDW 16.8* 16.7*  --   --   PLT 796* 805*  --   --    Thyroid   Recent Labs  Lab 10/02/24 0730 10/02/24 0807  TSH 25.500*  --   FREET4  --  0.84    BNPNo results for input(s): BNP,  PROBNP in the last 168 hours.  DDimer No results for input(s): DDIMER in the last 168 hours.  Radiology/Studies:  DG Chest Port 1 View Result Date: 10/04/2024 EXAM: 1 VIEW(S) XRAY OF THE CHEST 10/04/2024 08:56:00 AM COMPARISON: 10/02/2024 CLINICAL HISTORY: dyspnea FINDINGS: LINES, TUBES AND DEVICES: Left PleurX catheter stable in position. LUNGS AND PLEURA: Stable large loculated left pleural effusion. Increased moderate right pleural effusion. Increased diffuse interstitial opacities and patchy opacities throughout left lung and within right lower lung. No pneumothorax. HEART AND MEDIASTINUM: No acute abnormality of the cardiac and mediastinal silhouettes. BONES AND SOFT TISSUES: Stable dextrocurvature of thoracic spine. No acute osseous abnormality. IMPRESSION: 1. Increased diffuse interstitial and patchy opacities throughout the left lung and within the right lower lung. 2. Stable large loculated left pleural effusion and increased moderate right pleural effusion. Electronically signed by: Lonni Necessary MD 10/04/2024 10:00 AM EST RP Workstation: [id number]   DG Chest 2 View Result Date: 10/02/2024 EXAM: 2 VIEW(S) XRAY OF THE CHEST 10/02/2024 07:19:00 AM COMPARISON: 09/26/2024 CLINICAL HISTORY: shortness of breath, new O2 requirement FINDINGS: LINES, TUBES AND DEVICES: Left pleural drainage catheter in place. LUNGS AND PLEURA: Increased left pleural effusion. Persistent right effusion with basilar atelectasis. Worsening airspace disease at left lung base. No pneumothorax. HEART AND MEDIASTINUM: No acute abnormality of the cardiac and mediastinal silhouettes. BONES AND SOFT TISSUES: Dextroscoliosis thoracic spine. IMPRESSION: 1. Increased left pleural effusion with left pleural drainage catheter in place. 2. Persistent right effusion with basilar atelectasis. 3. Worsening airspace disease at left lung base. 4. Dextroscoliosis of the thoracic spine. Electronically signed by: Evalene Coho MD  10/02/2024 07:45  AM EST RP Workstation: [id number]     Assessment and Plan:  Caitlin [name] is a 69 y.o. 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 Eben.  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  Her SVT resolved after 6 mg IV adenosine.  Most troubling about her SVT is that she has had 4 episodes of SVT that required EMS and/or ER intervention with cardioversion or adenosine in the past 1 month.  The patient is severely ill from her metastatic breast cancer and is tenuous to say the least.  Unfortunately, she does not tolerate higher doses of diltiazem  due to hypotension and the family would like to avoid metoprolol given that she is already chronically fatigued.  Sadly, the patient has an estimated life expectancy of <12 months.  For all of these reasons, I think that prioritizing rhythm control as opposed to rate control is paramount given that the underlying substrate for her recurrent SVT (which is her pulmonary disease) is incurable.  I recommend initiating amiodarone and attempt to keep her in NSR and to promote normotension.  Although there are known side effects associated with amiodarone these tend to occur over many years which is not applicable in this clinical scenario.  The patient and her husband are agreeable to trying amiodarone. Stop diltiazem  Start IV amiodarone load for 5g Complete echocardiogram Maintain electrolytes Maintain telemetry  #PAF :: Remote history of PAF but no signs of it this admission or recently.  She is on Eliquis . Transitioning to amiodarone as  described above Continue Eliquis   #Sepsis #LLL PNA :: The patient was recently diagnosed with LLL pneumonia and has been on outpatient doxycycline and IV Rocephin .  She presented with a leukocytosis to 18,000 and and has increased pulmonary infiltrates.  I am concerned that the patient may have progression of her pneumonia vs postobstructive pneumonia in the setting of malignancy vs lymphangitic spread of her malignancy.  I see that a CT C/A/P is ordered which I agree with 100%.  Would strongly consider broadening her antibiotics as well. Follow-up CT C/A/P results Antibiotics per primary  #Peripheral Edema :: She has swelling which is likely all from lymphedema.  I think is reasonable for her to get some Lasix to help. Lasix 20 mg IV x 1 Strict I's and O's   Risk Assessment/Risk Scores:         CHA2DS2-VASc Score = 2   This indicates a 2.2% annual risk of stroke. The patient's score is based upon: CHF History: 0 HTN History: 0 Diabetes History: 0 Stroke History: 0 Vascular Disease History: 0 Age Score: 1 Gender Score: 1         For questions or updates, please contact [facility] Please consult [email or web address] for contact info under      Signed, Georganna Archer, MD  10/04/2024 12:26 PM      [1]  Allergies Allergen Reactions   Penicillins Rash    childhood reaction  Did it involve swelling of the face/tongue/throat, SOB, or low BP? No Did it involve sudden or severe rash/hives, skin peeling, or any reaction on the inside of your mouth or nose? Yes Did you  need to seek medical attention at a hospital or doctor's office? No When did it last happen?      childhood If all above answers are NO, may proceed with cephalosporin use.     "

## 2024-10-04 NOTE — ED Provider Notes (Signed)
 " [facility] EMERGENC[facility] DEPARTMENT AT [facility] Provider Note   CSN: [id number] Arrival date & time: 10/04/24  9192     Patient presents with: Shortness of Breath   Caitlin [name] is a 69 y.o. female.   HPI 69 yo history of breast cancer with bony mets, malignant pleural effusions, paroxysmal A-fib, hypothyroidism, recent massive PE status post thrombectomy, on Eliquis , with pleural VAC in place, with recent oxygen requirement, diagnosed with left lower lobe pneumonia 2 days ago presents with rapid heartbeat, hypotension, and chest tightness.  [name] husband is at bedside.  He is a primary care physician.  He is her caregiver at home and gives majority of the history.  She has been receiving IM Rocephin  and Zithromax  for her pneumonia.  Feels that she had improved from that but began having a rapid heart rate this morning.  She had associated symptoms with this.  She has had previous SVT and A-fib.  He tried vagal maneuvers and gave her Cardizem  without significant decrease in heart rate although it was 170 at home and is now 150.  She has been on IV chemo for palliation of her breast cancer.  However she has not received a dose for several weeks due to underlying problems and access.  She received her last dose of Rocephin  yesterday as well as Zithromax  and the plan was to start her on doxycycline.  She has not had a fever although she had chills.  Prior to the onset of the rapid heartbeat she did seem to be improving.  No nausea or vomiting and they feel that she has been taking adequate p.o.  EMS reported that her initial sats were 80% on room air and her blood pressure was 74 systolically.  They gave her a 500 cc bolus of fluid.  Here she remains tachycardic to 150 but blood pressures improved to 114/74 and sats 92% on room air per nurses note    Prior to Admission medications  Medication Sig Start Date End Date Taking? Authorizing Provider  albuterol  (VENTOLIN  HFA) 108 (90 Base)  MCG/ACT inhaler Inhale 2 puffs into the lungs every 4 (four) hours as needed for shortness of breath (Asthma). 05/30/22   [provider]  alendronate  (FOSAMAX ) 70 MG tablet Take 1 tablet (70 mg total) by mouth once a week. Take with a full glass of water  on an empty stomach. 12/21/23   Gudena, Vinay, MD  APIXABAN  (ELIQUIS ) VTE STARTER PACK (10MG  AND 5MG ) Take as directed on package: start with two-5mg  tablets twice daily for 7 days. On day 8, switch to one-5mg  tablet twice daily. 08/29/24   Cheryle Page, MD  azithromycin  (ZITHROMAX ) 250 MG tablet Take 1 tablet (250 mg total) by mouth daily. Take 1 tablet daily starting 12/31 10/02/24   Rogelia Jerilynn GORMAN, MD  cefdinir (OMNICEF) 300 MG capsule Take 1 capsule (300 mg total) by mouth 2 (two) times daily for 7 days. 10/02/24 10/09/24  Rogelia Jerilynn GORMAN, MD  cetirizine (ZYRTEC) 10 MG tablet Take 10 mg by mouth daily.    [provider]  dexamethasone  (DECADRON ) 4 MG tablet Take 1 tablets  by mouth daily for 3 days starting the day after chemotherapy. Take with food. 07/25/24   Gudena, Vinay, MD  diltiazem  (CARDIZEM  CD) 120 MG 24 hr capsule Take 120 mg by mouth daily.    [provider]  diltiazem  (CARDIZEM ) 30 MG tablet Take 30 mg by mouth 3 (three) times daily. 09/25/24   [provider]  furosemide (LASIX) 40 MG tablet Take 40 mg by mouth daily as needed. 09/11/24   [provider]  levothyroxine  (SYNTHROID ) 100 MCG tablet Take 100 mcg by mouth daily. 09/03/24   [provider]  lidocaine -prilocaine  (EMLA ) cream Apply to affected area once 07/25/24   Gudena, Vinay, MD  Multiple Vitamins-Minerals (MULTIVITAMIN WITH MINERALS) tablet Take 1 tablet by mouth daily.    [provider]  ondansetron  (ZOFRAN ) 8 MG tablet Take 1 tablet (8 mg total) by mouth every 8 (eight) hours as needed for nausea or vomiting. Start on the third day after chemotherapy. 07/25/24   Odean Potts, MD  OVER THE COUNTER  MEDICATION Take 1 capsule by mouth 2 (two) times daily. copaiba softgels    [provider]  oxyCODONE  (OXY IR/ROXICODONE ) 5 MG immediate release tablet Take 1 tablet (5 mg total) by mouth every 6 (six) hours as needed for severe pain (pain score 7-10). 07/30/24   Gudena, Vinay, MD  polyethylene glycol (MIRALAX  / GLYCOLAX ) 17 g packet Take 17 g by mouth daily as needed for moderate constipation. 08/29/24   Cheryle Page, MD  prochlorperazine  (COMPAZINE ) 10 MG tablet Take 1 tablet (10 mg total) by mouth every 6 (six) hours as needed for nausea or vomiting. 09/05/24   Gudena, Vinay, MD  PULMICORT  FLEXHALER 180 MCG/ACT inhaler Inhale 1 puff into the lungs daily.    [provider]    Allergies: Penicillins    Review of Systems  Updated Vital Signs BP 139/89   Pulse 92   Temp 98 F (36.7 C) (Oral)   Resp 17   Ht 1.6 m (5' 3)   Wt 60.3 kg   SpO2 95%   BMI 23.56 kg/m   Physical Exam Vitals and nursing note reviewed.  Constitutional:      General: She is not in acute distress.    Appearance: She is ill-appearing.  HENT:     Head: Normocephalic.     Mouth/Throat:     Pharynx: Oropharynx is clear.  Eyes:     Pupils: Pupils are equal, round, and reactive to light.  Cardiovascular:     Rate and Rhythm: Regular rhythm. Tachycardia present.  Pulmonary:     Effort: Tachypnea present.     Breath sounds: Examination of the right-middle field reveals decreased breath sounds. Examination of the right-lower field reveals decreased breath sounds. Examination of the left-lower field reveals decreased breath sounds. Decreased breath sounds and rhonchi present. No wheezing or rales.     Comments: Some scattered rhonchi Abdominal:     Palpations: Abdomen is soft.  Musculoskeletal:     Cervical back: Normal range of motion.     Comments: Trace edema bilateral lower extremities with pedal pulses intact Right upper extremity with lymphedema   Neurological:     General: No  focal deficit present.     Mental Status: She is alert.     (all labs ordered are listed, but only abnormal results are displayed) Labs Reviewed  CBC WITH DIFFERENTIAL/PLATELET - Abnormal; Notable for the following components:      Result Value   WBC 18.2 (*)    RBC 3.24 (*)    Hemoglobin 10.3 (*)    HCT 33.0 (*)    MCV 101.9 (*)    RDW 16.7 (*)    Platelets 805 (*)    Neutro Abs 15.5 (*)    Lymphs Abs 0.6 (*)    Monocytes Absolute 1.7 (*)    Abs Immature  Granulocytes 0.36 (*)    All other components within normal limits  COMPREHENSIVE METABOLIC PANEL WITH GFR - Abnormal; Notable for the following components:   Glucose, Bld 106 (*)    BUN 32 (*)    Albumin 3.2 (*)    AST 44 (*)    All other components within normal limits  I-STAT CG4 LACTIC ACID, ED - Abnormal; Notable for the following components:   Lactic Acid, Venous 2.7 (*)    All other components within normal limits  I-STAT VENOUS BLOOD GAS, ED - Abnormal; Notable for the following components:   pO2, Ven 79 (*)    Bicarbonate 31.0 (*)    TCO2 33 (*)    Acid-Base Excess 5.0 (*)    Calcium, Ion 1.13 (*)    HCT 32.0 (*)    Hemoglobin 10.9 (*)    All other components within normal limits  I-STAT CHEM 8, ED - Abnormal; Notable for the following components:   BUN 39 (*)    Glucose, Bld 106 (*)    Calcium, Ion 1.12 (*)    Hemoglobin 10.9 (*)    HCT 32.0 (*)    All other components within normal limits  CULTURE, BLOOD (SINGLE)  I-STAT CG4 LACTIC ACID, ED    EKG: EKG Interpretation Date/Time:  Thursday October 04 2024 09:25:06 EST Ventricular Rate:  96 PR Interval:  118 QRS Duration:  123 QT Interval:  377 QTC Calculation: 477 R Axis:   92  Text Interpretation: Sinus rhythm Borderline short PR interval RBBB and LPFB Confirmed by Levander Houston [phone]) on 10/04/2024 9:49:29 AM  Radiology: [facility] Chest Port 1 View Result Date: 10/04/2024 EXAM: 1 VIEW(S) XRAY OF THE CHEST 10/04/2024 08:56:00 AM COMPARISON: 10/02/2024  CLINICAL HISTORY: dyspnea FINDINGS: LINES, TUBES AND DEVICES: Left PleurX catheter stable in position. LUNGS AND PLEURA: Stable large loculated left pleural effusion. Increased moderate right pleural effusion. Increased diffuse interstitial opacities and patchy opacities throughout left lung and within right lower lung. No pneumothorax. HEART AND MEDIASTINUM: No acute abnormality of the cardiac and mediastinal silhouettes. BONES AND SOFT TISSUES: Stable dextrocurvature of thoracic spine. No acute osseous abnormality. IMPRESSION: 1. Increased diffuse interstitial and patchy opacities throughout the left lung and within the right lower lung. 2. Stable large loculated left pleural effusion and increased moderate right pleural effusion. Electronically signed by: Lonni Necessary MD 10/04/2024 10:00 AM EST RP Workstation: [id number]     .Critical Care  Performed by: Levander Houston, MD Authorized by: Levander Houston, MD   Critical care provider statement:    Critical care time (minutes):  60   Critical care end time:  10/04/2024 10:39 AM   Critical care time was exclusive of:  Separately billable procedures and treating other patients and teaching time   Critical care was necessary to treat or prevent imminent or life-threatening deterioration of the following conditions:  Circulatory failure   Critical care was time spent personally by me on the following activities:  Development of treatment plan with patient or surrogate, discussions with consultants, evaluation of patient's response to treatment, examination of patient, ordering and review of laboratory studies, ordering and review of radiographic studies, ordering and performing treatments and interventions, pulse oximetry, re-evaluation of patient's condition and review of old charts    Medications Ordered in the ED  azithromycin  (ZITHROMAX ) 500 mg in sodium chloride  0.9 % 250 mL IVPB (500 mg Intravenous New Bag/Given 10/04/24 1030)  lactated ringers   bolus 500 mL (0 mLs Intravenous Stopped 10/04/24 1028)  adenosine (  ADENOCARD) 6 MG/2ML injection 6 mg (6 mg Intravenous Given 10/04/24 0919)  cefTRIAXone  (ROCEPHIN ) 2 g in sodium chloride  0.9 % 100 mL IVPB (2 g Intravenous New Bag/Given 10/04/24 1003)  lactated ringers  bolus 250 mL (0 mLs Intravenous Stopped 10/04/24 1028)    Clinical Course as of 10/04/24 1040  Thu Oct 04, 2024  0924 Patient given adenosine 6 mg IV.  Patient converted from supraventricular tachycardia to a sinus rhythm [DR]  0925 915 blood pressure 84/54 is not accurate cuff was not on patient [DR]  0940 Chest x-Chazlyn Cude reviewed and interpreted with increased effusion versus infiltrate in right lower lobe from first prior 2 days ago. [DR]  9057 White count elevated 18,200 Lactic elevated at 2.7 [DR]    Clinical Course User Index [DR] Levander Houston, MD                                 Medical Decision Making Amount and/or Complexity of Data Reviewed Labs: ordered. Radiology: ordered.  Risk Prescription drug management.  69 year old female with metastatic breast cancer, recent PE, on anticoagulation, recent diagnosis of left lower lobe pneumonia, no pleural effusions, presents today with increased heart rate, dyspnea.  Patient seen and evaluated with elevated heart rate at 150.  Patient received adenosine and converted here to a sinus rhythm.  Patient reported hypotensive at home.  However here blood pressures have been 0100-0114 systolically.  There is an errant blood pressure of 84/54.  Recorded.  She was tachycardic to 147 but heart rate is down under 100 after conversion.  Sats are 92% and she is on nasal cannula.  She still confused here is tachypneic.  Chest x-Amaar Oshita is reviewed and has increasing markings with increased infiltrate versus effusion right lower lobe and diffuse additional markings throughout left side.  1 tachycardia patient with likely SVT and cardioverted here with adenosine repeat EKG normal sinus rhythm heart  rate 96 no acute ischemia noted  2 pneumonia patient recently treated for pneumonia and is still undergoing treatment.  She is reportedly afebrile, however I do not see any recorded here and will follow-up.  She does have a leukocytosis with white blood cell count 18,200.  This is increased from 2 days ago..  VBG pH 7.3 PaO2 79 pCO2 of 53 Patient appears to be improving from her home regimen of Rocephin  and Zithromax  and she is redosed with this here. 3 anemia globin 10.3 stable from first prior 4 lactic acid is elevated at 2.7-patient has not been hypotensive.  She was in SVT.  This is likely secondary to her SVT although cannot rule out that is secondary to infection at this time.  She is receiving antibiotics focused on her no pneumonia.  Blood pressures have been stable although there is erroneous 84/54 recorded.  Will continue to monitor and obtain second lactic 5-Will need further study ultrasound versus CTA and possible consult to IR for possible drainage of effusion on right  Patient is receiving IV fluids. Plan broad-spectrum antibiotics      Final diagnoses:  SVT (supraventricular tachycardia)  Community acquired pneumonia of left lower lobe of lung  Metastatic malignant neoplasm, unspecified site [facility])    ED Discharge Orders     None          Levander Houston, MD 10/04/24 1040  "

## 2024-10-05 ENCOUNTER — Inpatient Hospital Stay (HOSPITAL_COMMUNITY)

## 2024-10-05 ENCOUNTER — Other Ambulatory Visit: Payer: Self-pay

## 2024-10-05 DIAGNOSIS — J9621 Acute and chronic respiratory failure with hypoxia: Secondary | ICD-10-CM | POA: Diagnosis present

## 2024-10-05 DIAGNOSIS — I4719 Other supraventricular tachycardia: Secondary | ICD-10-CM | POA: Diagnosis present

## 2024-10-05 DIAGNOSIS — Z7901 Long term (current) use of anticoagulants: Secondary | ICD-10-CM

## 2024-10-05 DIAGNOSIS — Z17421 Hormone receptor negative with human epidermal growth factor receptor 2 negative status: Secondary | ICD-10-CM | POA: Diagnosis not present

## 2024-10-05 DIAGNOSIS — J90 Pleural effusion, not elsewhere classified: Secondary | ICD-10-CM

## 2024-10-05 DIAGNOSIS — Z66 Do not resuscitate: Secondary | ICD-10-CM | POA: Diagnosis present

## 2024-10-05 DIAGNOSIS — Z1152 Encounter for screening for COVID-19: Secondary | ICD-10-CM | POA: Diagnosis not present

## 2024-10-05 DIAGNOSIS — J9601 Acute respiratory failure with hypoxia: Secondary | ICD-10-CM

## 2024-10-05 DIAGNOSIS — C50911 Malignant neoplasm of unspecified site of right female breast: Secondary | ICD-10-CM | POA: Diagnosis present

## 2024-10-05 DIAGNOSIS — J189 Pneumonia, unspecified organism: Secondary | ICD-10-CM | POA: Diagnosis present

## 2024-10-05 DIAGNOSIS — C7802 Secondary malignant neoplasm of left lung: Secondary | ICD-10-CM | POA: Diagnosis present

## 2024-10-05 DIAGNOSIS — I480 Paroxysmal atrial fibrillation: Secondary | ICD-10-CM | POA: Diagnosis present

## 2024-10-05 DIAGNOSIS — D72829 Elevated white blood cell count, unspecified: Secondary | ICD-10-CM | POA: Diagnosis not present

## 2024-10-05 DIAGNOSIS — Z7189 Other specified counseling: Secondary | ICD-10-CM | POA: Diagnosis not present

## 2024-10-05 DIAGNOSIS — D75839 Thrombocytosis, unspecified: Secondary | ICD-10-CM | POA: Diagnosis not present

## 2024-10-05 DIAGNOSIS — I471 Supraventricular tachycardia, unspecified: Secondary | ICD-10-CM | POA: Diagnosis present

## 2024-10-05 DIAGNOSIS — J9622 Acute and chronic respiratory failure with hypercapnia: Secondary | ICD-10-CM | POA: Diagnosis present

## 2024-10-05 DIAGNOSIS — Z789 Other specified health status: Secondary | ICD-10-CM | POA: Diagnosis not present

## 2024-10-05 DIAGNOSIS — D638 Anemia in other chronic diseases classified elsewhere: Secondary | ICD-10-CM | POA: Diagnosis not present

## 2024-10-05 DIAGNOSIS — Z711 Person with feared health complaint in whom no diagnosis is made: Secondary | ICD-10-CM | POA: Diagnosis not present

## 2024-10-05 DIAGNOSIS — J45909 Unspecified asthma, uncomplicated: Secondary | ICD-10-CM | POA: Diagnosis present

## 2024-10-05 DIAGNOSIS — J9602 Acute respiratory failure with hypercapnia: Secondary | ICD-10-CM | POA: Diagnosis not present

## 2024-10-05 DIAGNOSIS — Z515 Encounter for palliative care: Secondary | ICD-10-CM | POA: Diagnosis not present

## 2024-10-05 DIAGNOSIS — A419 Sepsis, unspecified organism: Secondary | ICD-10-CM | POA: Diagnosis present

## 2024-10-05 DIAGNOSIS — R609 Edema, unspecified: Secondary | ICD-10-CM | POA: Diagnosis not present

## 2024-10-05 DIAGNOSIS — E441 Mild protein-calorie malnutrition: Secondary | ICD-10-CM | POA: Diagnosis present

## 2024-10-05 DIAGNOSIS — D849 Immunodeficiency, unspecified: Secondary | ICD-10-CM | POA: Diagnosis present

## 2024-10-05 DIAGNOSIS — R531 Weakness: Secondary | ICD-10-CM | POA: Diagnosis not present

## 2024-10-05 DIAGNOSIS — R0602 Shortness of breath: Secondary | ICD-10-CM

## 2024-10-05 DIAGNOSIS — E8729 Other acidosis: Secondary | ICD-10-CM | POA: Diagnosis not present

## 2024-10-05 DIAGNOSIS — Z86711 Personal history of pulmonary embolism: Secondary | ICD-10-CM

## 2024-10-05 DIAGNOSIS — C799 Secondary malignant neoplasm of unspecified site: Secondary | ICD-10-CM | POA: Diagnosis not present

## 2024-10-05 DIAGNOSIS — I10 Essential (primary) hypertension: Secondary | ICD-10-CM | POA: Diagnosis present

## 2024-10-05 DIAGNOSIS — L089 Local infection of the skin and subcutaneous tissue, unspecified: Secondary | ICD-10-CM | POA: Diagnosis present

## 2024-10-05 DIAGNOSIS — E874 Mixed disorder of acid-base balance: Secondary | ICD-10-CM | POA: Diagnosis present

## 2024-10-05 DIAGNOSIS — J910 Malignant pleural effusion: Secondary | ICD-10-CM

## 2024-10-05 DIAGNOSIS — C7951 Secondary malignant neoplasm of bone: Secondary | ICD-10-CM | POA: Diagnosis present

## 2024-10-05 DIAGNOSIS — E039 Hypothyroidism, unspecified: Secondary | ICD-10-CM | POA: Diagnosis present

## 2024-10-05 DIAGNOSIS — R6520 Severe sepsis without septic shock: Secondary | ICD-10-CM | POA: Diagnosis present

## 2024-10-05 DIAGNOSIS — R627 Adult failure to thrive: Secondary | ICD-10-CM | POA: Diagnosis present

## 2024-10-05 DIAGNOSIS — D539 Nutritional anemia, unspecified: Secondary | ICD-10-CM | POA: Diagnosis present

## 2024-10-05 LAB — BLOOD GAS, ARTERIAL
Acid-Base Excess: 8.6 mmol/L — ABNORMAL HIGH (ref 0.0–2.0)
Acid-base deficit: 0.1 mmol/L (ref 0.0–2.0)
Bicarbonate: 31.7 mmol/L — ABNORMAL HIGH (ref 20.0–28.0)
Bicarbonate: 34.1 mmol/L — ABNORMAL HIGH (ref 20.0–28.0)
Drawn by: 59101
Drawn by: 27022
O2 Saturation: 99 %
O2 Saturation: 99.8 %
Patient temperature: 36.9
Patient temperature: 36.8
pCO2 arterial: 93 mmHg (ref 32–48)
pCO2 arterial: 49 mmHg — ABNORMAL HIGH (ref 32–48)
pH, Arterial: 7.14 — CL (ref 7.35–7.45)
pH, Arterial: 7.45 (ref 7.35–7.45)
pO2, Arterial: 113 mmHg — ABNORMAL HIGH (ref 83–108)
pO2, Arterial: 129 mmHg — ABNORMAL HIGH (ref 83–108)

## 2024-10-05 LAB — BODY FLUID CELL COUNT WITH DIFFERENTIAL
Eos, Fluid: 0 %
Lymphs, Fluid: 13 %
Monocyte-Macrophage-Serous Fluid: 82 % (ref 50–90)
Neutrophil Count, Fluid: 5 % (ref 0–25)
Other Cells, Fluid: ABNORMAL %
Total Nucleated Cell Count, Fluid: 1538 uL — ABNORMAL HIGH (ref 0–1000)

## 2024-10-05 LAB — I-STAT ARTERIAL BLOOD GAS, ED
Acid-Base Excess: 4 mmol/L — ABNORMAL HIGH (ref 0.0–2.0)
Acid-Base Excess: 5 mmol/L — ABNORMAL HIGH (ref 0.0–2.0)
Bicarbonate: 32.3 mmol/L — ABNORMAL HIGH (ref 20.0–28.0)
Bicarbonate: 33.1 mmol/L — ABNORMAL HIGH (ref 20.0–28.0)
Calcium, Ion: 1.17 mmol/L (ref 1.15–1.40)
Calcium, Ion: 1.18 mmol/L (ref 1.15–1.40)
HCT: 29 % — ABNORMAL LOW (ref 36.0–46.0)
HCT: 30 % — ABNORMAL LOW (ref 36.0–46.0)
Hemoglobin: 10.2 g/dL — ABNORMAL LOW (ref 12.0–15.0)
Hemoglobin: 9.9 g/dL — ABNORMAL LOW (ref 12.0–15.0)
O2 Saturation: 94 %
O2 Saturation: 98 %
Potassium: 4 mmol/L (ref 3.5–5.1)
Potassium: 4.2 mmol/L (ref 3.5–5.1)
Sodium: 136 mmol/L (ref 135–145)
Sodium: 137 mmol/L (ref 135–145)
TCO2: 34 mmol/L — ABNORMAL HIGH (ref 22–32)
TCO2: 35 mmol/L — ABNORMAL HIGH (ref 22–32)
pCO2 arterial: 66.1 mmHg (ref 32–48)
pCO2 arterial: 75.1 mmHg (ref 32–48)
pH, Arterial: 7.252 — ABNORMAL LOW (ref 7.35–7.45)
pH, Arterial: 7.297 — ABNORMAL LOW (ref 7.35–7.45)
pO2, Arterial: 121 mmHg — ABNORMAL HIGH (ref 83–108)
pO2, Arterial: 84 mmHg (ref 83–108)

## 2024-10-05 LAB — TROPONIN T, HIGH SENSITIVITY: Troponin T High Sensitivity: 28 ng/L — ABNORMAL HIGH (ref 0–19)

## 2024-10-05 LAB — CBC
HCT: 29.7 % — ABNORMAL LOW (ref 36.0–46.0)
Hemoglobin: 9.1 g/dL — ABNORMAL LOW (ref 12.0–15.0)
MCH: 31.8 pg (ref 26.0–34.0)
MCHC: 30.6 g/dL (ref 30.0–36.0)
MCV: 103.8 fL — ABNORMAL HIGH (ref 80.0–100.0)
Platelets: 697 K/uL — ABNORMAL HIGH (ref 150–400)
RBC: 2.86 MIL/uL — ABNORMAL LOW (ref 3.87–5.11)
RDW: 16.9 % — ABNORMAL HIGH (ref 11.5–15.5)
WBC: 15.2 K/uL — ABNORMAL HIGH (ref 4.0–10.5)
nRBC: 0 % (ref 0.0–0.2)

## 2024-10-05 LAB — BLOOD GAS, VENOUS
Acid-Base Excess: 1.2 mmol/L (ref 0.0–2.0)
Acid-Base Excess: 2.1 mmol/L — ABNORMAL HIGH (ref 0.0–2.0)
Acid-Base Excess: 5.2 mmol/L — ABNORMAL HIGH (ref 0.0–2.0)
Acid-Base Excess: 6.6 mmol/L — ABNORMAL HIGH (ref 0.0–2.0)
Acid-Base Excess: 7.8 mmol/L — ABNORMAL HIGH (ref 0.0–2.0)
Bicarbonate: 32.5 mmol/L — ABNORMAL HIGH (ref 20.0–28.0)
Bicarbonate: 32.6 mmol/L — ABNORMAL HIGH (ref 20.0–28.0)
Bicarbonate: 32.7 mmol/L — ABNORMAL HIGH (ref 20.0–28.0)
Bicarbonate: 33.9 mmol/L — ABNORMAL HIGH (ref 20.0–28.0)
Bicarbonate: 36.6 mmol/L — ABNORMAL HIGH (ref 20.0–28.0)
O2 Saturation: 56.7 %
O2 Saturation: 61.5 %
O2 Saturation: 84.3 %
O2 Saturation: 86.1 %
O2 Saturation: 86.7 %
Patient temperature: 33.7
Patient temperature: 37
Patient temperature: 37
Patient temperature: 37
Patient temperature: 37
pCO2, Ven: 54 mmHg (ref 44–60)
pCO2, Ven: 59 mmHg (ref 44–60)
pCO2, Ven: 71 mmHg (ref 44–60)
pCO2, Ven: 82 mmHg (ref 44–60)
pCO2, Ven: 89 mmHg (ref 44–60)
pH, Ven: 7.17 — CL (ref 7.25–7.43)
pH, Ven: 7.2 — ABNORMAL LOW (ref 7.25–7.43)
pH, Ven: 7.32 (ref 7.25–7.43)
pH, Ven: 7.35 (ref 7.25–7.43)
pH, Ven: 7.39 (ref 7.25–7.43)
pO2, Ven: 32 mmHg (ref 32–45)
pO2, Ven: 38 mmHg (ref 32–45)
pO2, Ven: 48 mmHg — ABNORMAL HIGH (ref 32–45)
pO2, Ven: 52 mmHg — ABNORMAL HIGH (ref 32–45)
pO2, Ven: 58 mmHg — ABNORMAL HIGH (ref 32–45)

## 2024-10-05 LAB — BASIC METABOLIC PANEL WITH GFR
Anion gap: 10 (ref 5–15)
BUN: 28 mg/dL — ABNORMAL HIGH (ref 8–23)
CO2: 27 mmol/L (ref 22–32)
Calcium: 7.7 mg/dL — ABNORMAL LOW (ref 8.9–10.3)
Chloride: 103 mmol/L (ref 98–111)
Creatinine, Ser: 0.51 mg/dL (ref 0.44–1.00)
GFR, Estimated: >60 mL/min
Glucose, Bld: 86 mg/dL (ref 70–99)
Potassium: 4.4 mmol/L (ref 3.5–5.1)
Sodium: 139 mmol/L (ref 135–145)

## 2024-10-05 LAB — LACTIC ACID, PLASMA
Lactic Acid, Venous: 2.2 mmol/L (ref 0.5–1.9)
Lactic Acid, Venous: 2.1 mmol/L (ref 0.5–1.9)

## 2024-10-05 LAB — PROTEIN, TOTAL: Total Protein: 5.7 g/dL — ABNORMAL LOW (ref 6.5–8.1)

## 2024-10-05 LAB — LACTATE DEHYDROGENASE: LDH: 774 U/L — ABNORMAL HIGH (ref 105–235)

## 2024-10-05 MED ORDER — IPRATROPIUM BROMIDE 0.02 % IN SOLN
0.5000 mg | Freq: Four times a day (QID) | RESPIRATORY_TRACT | Status: AC
  Administered 2024-10-05: 0.5 mg via RESPIRATORY_TRACT
  Filled 2024-10-05: qty 2.5

## 2024-10-05 MED ORDER — AMIODARONE IV BOLUS ONLY 150 MG/100ML: 150.0000 mg | Freq: Once | INTRAVENOUS | Status: DC

## 2024-10-05 MED ORDER — FUROSEMIDE 10 MG/ML IJ SOLN
40.0000 mg | Freq: Once | INTRAMUSCULAR | Status: AC
  Administered 2024-10-05: 40 mg via INTRAVENOUS
  Filled 2024-10-05: qty 4

## 2024-10-05 MED ORDER — IPRATROPIUM BROMIDE 0.02 % IN SOLN
0.5000 mg | Freq: Four times a day (QID) | RESPIRATORY_TRACT | Status: AC
  Administered 2024-10-05 (×2): 0.5 mg via RESPIRATORY_TRACT
  Filled 2024-10-05: qty 2.5

## 2024-10-05 MED ORDER — IPRATROPIUM BROMIDE 0.02 % IN SOLN
0.5000 mg | RESPIRATORY_TRACT | Status: DC | PRN
  Filled 2024-10-05 (×2): qty 2.5

## 2024-10-05 NOTE — Progress Notes (Addendum)
 Messaged by RN that patient is hypoxic 84 on 7L and rectal temp of 95.6. Patient is evaluated bedside, patient's husband, Dr. Atilano is present.  Patient appeared diaphoretic, lethargic, extremities warm to touch.  She denied any pain, denied any chest pain or shortness of breath.  Did report trouble breathing. Telemetry showed heart rates alternating from 80s to 120s; oxygen saturation in mid 80s initially on 7 L.   Plan: - STAT CXR > Stable left-sided chest tube. Stable loculated bilateral pleural effusions. Increased bilateral lung opacities are noted concerning for edema or possibly inflammation.  - PCCM consulted, spoke with Sammi Gore, PA > who will go and evaluate the patient  - VBG showed pH 7.2, pCO2 82, pO2 48, bicarb 33.9   - ABG showed pH 7.14, pCO2 93, pO2 113, bicarb 31.7 > consistent with respiratory acidosis and metabolic alkalosis  - RN is advised to place patient on BiPAP  - Will repeat ABG every 3 hours to monitor for improvement/worsening - Lactic acid levels - EKG showed A-fib with RVR  - Appreciate cardiology > IV Lasix and amiodarone bolus

## 2024-10-05 NOTE — Progress Notes (Signed)
 "   Progress Note  Patient Name: Caitlin [name] Date of Encounter: 10/05/2024  Primary Cardiologist:   None   Subjective   She is somnolent just having had pain meds.  She is having right sided chest pain. No acute SOB   Inpatient Medications    Scheduled Meds:  apixaban   5 mg Oral BID   budesonide  (PULMICORT ) nebulizer solution  0.25 mg Nebulization Daily   doxycycline  100 mg Oral Q12H   ipratropium  0.5 mg Nebulization Q6H   levothyroxine   100 mcg Oral Q0600   Continuous Infusions:  amiodarone 30 mg/hr (10/05/24 0010)   piperacillin-tazobactam (ZOSYN)  IV 3.375 g (10/05/24 0657)   PRN Meds: acetaminophen  **OR** acetaminophen , albuterol , docusate sodium , ketorolac , polyethylene glycol   Vital Signs    Vitals:   10/05/24 0500 10/05/24 0530 10/05/24 0538 10/05/24 0612  BP:  106/72  126/78  Pulse: 93   77  Resp:    20  Temp:   98.4 F (36.9 C)   TempSrc:   Axillary   SpO2: 99%   100%  Weight:      Height:        Intake/Output Summary (Last 24 hours) at 10/05/2024 0820 Last data filed at 10/04/2024 1130 Gross per 24 hour  Intake 248.53 ml  Output --  Net 248.53 ml   Filed Weights   10/04/24 0810  Weight: 60.3 kg    Telemetry    NSR - Personally Reviewed  ECG    NA - Personally Reviewed  Physical Exam   GEN: No acute distress.  Chronically ill appearing  Neck: No  JVD Cardiac: RRR, no murmurs, rubs, or gallops.  Respiratory:     Decreased breath sounds GI: Soft, nontender, non-distended  MS:   Mild diffuse edema; No deformity. Neuro:  Nonfocal  Psych: Normal affect   Labs    Chemistry Recent Labs  Lab 10/02/24 0656 10/04/24 0902 10/04/24 0912 10/04/24 0914 10/05/24 0040 10/05/24 0200 10/05/24 0500  NA 137 139 137   < > 136 137 139  K 4.0 4.9 4.7   < > 4.2 4.0 4.4  CL 97* 100 100  --   --   --  103  CO2 25 26  --   --   --   --  27  GLUCOSE 106* 106* 106*  --   --   --  86  BUN 26* 32* 39*  --   --   --  28*  CREATININE 0.66 0.64  0.80  --   --   --  0.51  CALCIUM 9.1 9.1  --   --   --   --  7.7*  PROT 6.4* 6.5  --   --   --   --   --   ALBUMIN 3.1* 3.2*  --   --   --   --   --   AST 50* 44*  --   --   --   --   --   ALT 10 11  --   --   --   --   --   ALKPHOS 120 116  --   --   --   --   --   BILITOT 0.3 <0.2  --   --   --   --   --   GFRNONAA >60 >60  --   --   --   --  >60  ANIONGAP 14 13  --   --   --   --  10   < > = values in this interval not displayed.     Hematology Recent Labs  Lab 10/02/24 0656 10/04/24 0902 10/04/24 0912 10/05/24 0040 10/05/24 0200 10/05/24 0500  WBC 12.8* 18.2*  --   --   --  15.2*  RBC 3.34* 3.24*  --   --   --  2.86*  HGB 10.3* 10.3*   < > 10.2* 9.9* 9.1*  HCT 33.2* 33.0*   < > 30.0* 29.0* 29.7*  MCV 99.4 101.9*  --   --   --  103.8*  MCH 30.8 31.8  --   --   --  31.8  MCHC 31.0 31.2  --   --   --  30.6  RDW 16.8* 16.7*  --   --   --  16.9*  PLT 796* 805*  --   --   --  697*   < > = values in this interval not displayed.    Cardiac EnzymesNo results for input(s): TROPONINI in the last 168 hours. No results for input(s): TROPIPOC in the last 168 hours.   BNPNo results for input(s): BNP, PROBNP in the last 168 hours.   DDimer No results for input(s): DDIMER in the last 168 hours.   Radiology    ECHOCARDIOGRAM COMPLETE Result Date: 10/04/2024    ECHOCARDIOGRAM REPORT   Patient Name:   Caitlin [name] Date of Exam: 10/04/2024 Medical Rec #:  [id number]        Height:       63.0 in Accession #:    [id number]       Weight:       133.0 lb Date of Birth:  [birth year]       BSA:          1.626 m Patient Age:    69 years         BP:           133/81 mmHg Patient Gender: F                HR:           87 bpm. Exam Location:  Inpatient Procedure: 2D Echo, Cardiac Doppler and Color Doppler (Both Spectral and Color            Flow Doppler were utilized during procedure). Indications:    SVT (Supraventricular Tachycardia)  History:        Patient has prior history of  Echocardiogram examinations, most                 recent 08/26/2024. Arrythmias:Atrial Fibrillation. Malignant                 neoplasm of upper-outer quadrant of right breast in female,                 estrogen receptor positive (HCC). S/P mastectomy, right. Hx of                 Pulmonary embolism (HCC). Port-A-Cath in place. Chronic                 anticoagulation.  Sonographer:    Aida Pizza RCS Referring Phys: [id number] GEORGANNA ARCHER IMPRESSIONS  1. Left ventricular ejection fraction, by estimation, is 60 to 65%. The left ventricle has normal function. The left ventricle has no regional wall motion abnormalities. There is mild concentric left ventricular hypertrophy. Left ventricular diastolic parameters were normal.  2. Right ventricular systolic function is normal. The right  ventricular size is normal. There is normal pulmonary artery systolic pressure.  3. A small pericardial effusion is present. The pericardial effusion is circumferential. There is no evidence of cardiac tamponade. Large pleural effusion.  4. The mitral valve is normal in structure. Trivial mitral valve regurgitation. No evidence of mitral stenosis.  5. The aortic valve is tricuspid. Aortic valve regurgitation is mild. No aortic stenosis is present.  6. The inferior vena cava is normal in size with greater than 50% respiratory variability, suggesting right atrial pressure of 3 mmHg. Comparison(s): Changes from prior study are noted. Small circumferential pericardial effusion now present. FINDINGS  Left Ventricle: Left ventricular ejection fraction, by estimation, is 60 to 65%. The left ventricle has normal function. The left ventricle has no regional wall motion abnormalities. The left ventricular internal cavity size was normal in size. There is  mild concentric left ventricular hypertrophy. Left ventricular diastolic parameters were normal. Right Ventricle: The right ventricular size is normal. No increase in right ventricular wall  thickness. Right ventricular systolic function is normal. There is normal pulmonary artery systolic pressure. The tricuspid regurgitant velocity is 1.92 m/s, and  with an assumed right atrial pressure of 3 mmHg, the estimated right ventricular systolic pressure is 17.7 mmHg. Left Atrium: Left atrial size was normal in size. Right Atrium: Right atrial size was normal in size. Pericardium: A small pericardial effusion is present. The pericardial effusion is circumferential. There is no evidence of cardiac tamponade. Mitral Valve: The mitral valve is normal in structure. Trivial mitral valve regurgitation. No evidence of mitral valve stenosis. Tricuspid Valve: The tricuspid valve is normal in structure. Tricuspid valve regurgitation is mild . No evidence of tricuspid stenosis. Aortic Valve: The aortic valve is tricuspid. Aortic valve regurgitation is mild. No aortic stenosis is present. Aortic valve mean gradient measures 4.0 mmHg. Aortic valve peak gradient measures 9.2 mmHg. Aortic valve area, by VTI measures 2.35 cm. Pulmonic Valve: The pulmonic valve was normal in structure. Pulmonic valve regurgitation is not visualized. No evidence of pulmonic stenosis. Aorta: The aortic root and ascending aorta are structurally normal, with no evidence of dilitation. Venous: The inferior vena cava is normal in size with greater than 50% respiratory variability, suggesting right atrial pressure of 3 mmHg. IAS/Shunts: No atrial level shunt detected by color flow Doppler. Additional Comments: There is a large pleural effusion.  LEFT VENTRICLE PLAX 2D LVIDd:         4.00 cm   Diastology LVIDs:         2.70 cm   LV e' medial:    8.81 cm/s LV PW:         1.20 cm   LV E/e' medial:  12.9 LV IVS:        1.10 cm   LV e' lateral:   13.20 cm/s LVOT diam:     1.90 cm   LV E/e' lateral: 8.6 LV SV:         75 LV SV Index:   46 LVOT Area:     2.84 cm  RIGHT VENTRICLE RV S prime:     13.40 cm/s TAPSE (M-mode): 1.9 cm LEFT ATRIUM              Index        RIGHT ATRIUM           Index LA diam:        2.40 cm 1.48 cm/m   RA Area:     13.70 cm LA Vol (A2C):  39.4 ml 24.23 ml/m  RA Volume:   35.70 ml  21.96 ml/m LA Vol (A4C):   36.7 ml 22.57 ml/m LA Biplane Vol: 38.1 ml 23.43 ml/m  AORTIC VALVE AV Area (Vmax):    2.40 cm AV Area (Vmean):   2.36 cm AV Area (VTI):     2.35 cm AV Vmax:           152.00 cm/s AV Vmean:          91.700 cm/s AV VTI:            0.318 m AV Peak Grad:      9.2 mmHg AV Mean Grad:      4.0 mmHg LVOT Vmax:         128.50 cm/s LVOT Vmean:        76.200 cm/s LVOT VTI:          0.264 m LVOT/AV VTI ratio: 0.83  AORTA Ao Root diam: 3.40 cm MITRAL VALVE                TRICUSPID VALVE MV Area (PHT): 3.21 cm     TR Peak grad:   14.7 mmHg MV Decel Time: 236 msec     TR Vmax:        192.00 cm/s MV E velocity: 114.00 cm/s MV A velocity: 82.30 cm/s   SHUNTS MV E/A ratio:  1.39         Systemic VTI:  0.26 m                             Systemic Diam: 1.90 cm Georganna Archer Electronically signed by Georganna Archer Signature Date/Time: 10/04/2024/5:35:59 PM    Final    DG Chest Port 1 View Result Date: 10/04/2024 EXAM: 1 VIEW(S) XRAY OF THE CHEST 10/04/2024 08:56:00 AM COMPARISON: 10/02/2024 CLINICAL HISTORY: dyspnea FINDINGS: LINES, TUBES AND DEVICES: Left PleurX catheter stable in position. LUNGS AND PLEURA: Stable large loculated left pleural effusion. Increased moderate right pleural effusion. Increased diffuse interstitial opacities and patchy opacities throughout left lung and within right lower lung. No pneumothorax. HEART AND MEDIASTINUM: No acute abnormality of the cardiac and mediastinal silhouettes. BONES AND SOFT TISSUES: Stable dextrocurvature of thoracic spine. No acute osseous abnormality. IMPRESSION: 1. Increased diffuse interstitial and patchy opacities throughout the left lung and within the right lower lung. 2. Stable large loculated left pleural effusion and increased moderate right pleural effusion. Electronically signed  by: Lonni Necessary MD 10/04/2024 10:00 AM EST RP Workstation: [id number]    Cardiac Studies   Echo:  See above.   Patient Profile     69 y.o. 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 Eben.   Her husband is Dr. Deward Soja a long time primary care MD in [place].   Assessment & Plan    SVT:   This has been recurrent over the last several weeks. Options are limited with her underling illness and hypotension.  Continue IV amiodarone today and likely change to oral in the AM.   PAF:  She remains on Eliquis .    EDEMA:  Low dose Lasix .  Edema is predominantly lymphedema.  Intake and output incomplete.   No diuretic today.   PERICARDIA EFFUSION:  This is small and not clinically relevant.    For questions or updates, please contact [facility] Please consult [email or web address] for contact info under Cardiology/STEMI.   Signed, Lynwood Schilling, MD  10/05/2024, 8:20 AM    "

## 2024-10-05 NOTE — Discharge Planning (Signed)
 Transition of Care [facility]) - Inpatient Brief Assessment   Patient Details  Name: Caitlin [name] MRN: [id number] Date of Birth: 1955-11-06  Transition of Care [facility]) CM/SW Contact:    Caitlin Saunas, RN Phone Number: 10/05/2024, 10:23 AM   Clinical Narrative:  Transition of Care Department [facility]) has reviewed patient and no TOC needs have been identified at this time. We will continue to monitor patient advancement through interdisciplinary progression rounds. If new patient transition needs arise, please place a TOC consult.     Transition of Care Asessment: Insurance and Status: Insurance coverage has been reviewed Patient has primary care physician: Yes Irvin Sieving, MD) Home environment has been reviewed: home with spouse Prior level of function:: independent; DME rolling walker for PRN use Prior/Current Home Services: No current home services Social Drivers of Health Review: SDOH reviewed no interventions necessary Readmission risk has been reviewed: Yes Transition of care needs: no transition of care needs at this time

## 2024-10-05 NOTE — Plan of Care (Signed)
" °  Problem: Education: Goal: Knowledge of General Education information will improve Description: Including pain rating scale, medication(s)/side effects and non-pharmacologic comfort measures Outcome: Progressing   Problem: Health Behavior/Discharge Planning: Goal: Ability to manage health-related needs will improve Outcome: Progressing   Problem: Clinical Measurements: Goal: Ability to maintain clinical measurements within normal limits will improve Outcome: Progressing Goal: Will remain free from infection Outcome: Progressing Goal: Cardiovascular complication will be avoided Outcome: Progressing   Problem: Coping: Goal: Level of anxiety will decrease Outcome: Progressing   Problem: Elimination: Goal: Will not experience complications related to bowel motility Outcome: Progressing Goal: Will not experience complications related to urinary retention 10/05/2024 1855 by Kermit Erby NOVAK, RN Outcome: Progressing 10/05/2024 1748 by Kermit Erby NOVAK, RN Outcome: Progressing   Problem: Pain Managment: Goal: General experience of comfort will improve and/or be controlled 10/05/2024 1855 by Kermit Erby NOVAK, RN Outcome: Progressing 10/05/2024 1748 by Kermit Erby NOVAK, RN Outcome: Progressing   Problem: Safety: Goal: Ability to remain free from injury will improve 10/05/2024 1855 by Kermit Erby NOVAK, RN Outcome: Progressing 10/05/2024 1748 by Kermit Erby NOVAK, RN Outcome: Progressing   Problem: Skin Integrity: Goal: Risk for impaired skin integrity will decrease Outcome: Progressing   "

## 2024-10-05 NOTE — Consult Note (Signed)
 "  NAME:  Caitlin [name], MRN:  [id number], DOB:  July 25, 1956, LOS: 0 ADMISSION DATE:  10/04/2024, CONSULTATION DATE:  10/05/24 REFERRING MD:  Eben CHIEF COMPLAINT:  Dyspnea   History of Present Illness:  Caitlin [name] is a 69 y.o. 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  She has had multiple right-sided pleural effusions and has had 2 thoracentesis recently with the most recent being 09/26/2024.  She was admitted to the IMTS service on 10/04/2024 for pneumonia, SVT, pleural effusions.  On 1/2, she had increased work of breathing along with altered mental status.  She was also noted to be tachypneic and hypoxic as well as recurrent A.fib (per husband, it is paroxysmal). ABGs demonstrated respiratory acidosis (7.14/93/113).  CXR demonstrated worsening bilateral airspace opacities as well as effusions.  She has a known left-sided PleurX which her husband (he is a family physician) states that has had decreased drainage ever since it was placed, currently draining around 10 every other day.  He is wondering whether she has pleurodesis on the left.  She was placed on BiPAP but still had increased work of breathing and hypoxia.  PCCM was subsequently consulted for assistance in management. While we were evaluating her, she self converted from A.fib to NSR.  Palliative care note from 1/2 reviewed.  DNR/DNI established after patient's spouse opted to respect her wishes and her circumstances such as these.  Further discussions and meetings are planned but it appears that they are  leaning towards home with hospice upon discharge.  Pertinent  Medical History:  has Atrial fibrillation with RVR (HCC); Chronic anticoagulation; Hypothyroidism; PALPITATIONS; 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female, estrogen receptor positive (HCC); Appendicitis with peritonitis; Family history of prostate cancer; Genetic testing; Port-A-Cath in place; S/P mastectomy, right; Pulmonary embolism (HCC); Community acquired pneumonia; Paroxysmal supraventricular tachycardia; and Metastatic malignant neoplasm (HCC) on their problem list.  Significant Hospital Events: Including procedures, antibiotic start and stop dates in addition to other pertinent events   1/1 admit 1/2 PCCM consult, R thoracentesis by Dr. Neda with 1300 dark amber fluid removed  Interim History / Subjective:  On BiPAP. Somnolent but will open eyes intermittently. Tries to answer husband at times but not able to interact much.  While we were in the room, she converted from A.fib to NSR.  Objective:  Blood pressure 118/77, pulse 79, temperature 98.4 F (36.9 C), temperature source Axillary, resp. rate (!) 25, height 5' 3 (1.6 m), weight 60.3 kg, SpO2 100%.    FiO2 (%):  [40 %-60 %] 40 % PEEP:  [7 cmH20-8 cmH20] 7 cmH20 Pressure Support:  [11 cmH20-12 cmH20] 11 cmH20  No intake or output data in the 24 hours ending 10/05/24 1641 Filed Weights   10/04/24 0810  Weight: 60.3 kg     Physical Exam: General: Adult female, chronically ill appearing, critically ill. Neuro: Somnolent, opens eyes to voice but not able to interact much. HEENT: [place]/AT. Sclerae anicteric. BiPAP in place. Cardiovascular: RRR, no M/R/G.  Lungs: Respirations shallow and labored on BiPAP. Low volumes but coarse bilaterally but diminished in bases.  Abdomen:  BS x 4, soft, NT/ND.  Musculoskeletal: No gross deformities, RUE edema.  Assessment & Plan:   Bilateral pleural effusions - known to be malignant on the left from  cytology 08/28/2024, no cytology was sent from recent thoracenteses performed on the right; however, presumed to be malignant given history. S/p left-sided PleurX catheter - per husband, drainage has decreased ever since placement, now approaching around 10 cc only every other day or so. - A right sided thoracentesis was performed today by Dr. Neda, 1300cc dark amber fluid obtained. - Send pleural for routine studies as well as cytology. - Will likely need right sided PleurX catheter placement as well given her history of rapid reaccumulation (this is her third thoracentesis in the last month).  This was relayed to the primary team who has placed an IR consult. - Agree with empiric abx. - Follow cultures.  Acute hypoxic and hypercapnic respiratory failure - presumed due to respiratory distress/poor effort in setting of bilateral effusions and pneumonia. - Continue BiPAP for now. - Hopefully further improvement now that she has had right thora as above. - Wean to [facility] or [facility] as able, goal sats > 92%. - Consider low dose morphine  for air hunger/distress. - Continue abx and follow cultures.  Hx Recent PE November 2025 s/p thrombectomy and currently on Apixaban . - Continue Apixaban .  Goals of care. - DNR/DNI confirmed with palliative care this admission. Appears that the plan is to go home with hospice ultimately.   Rest per primary team.   Labs   CBC: Recent Labs  Lab 10/02/24 0656 10/04/24 0902 10/04/24 0912 10/04/24 0914 10/05/24 0040 10/05/24 0200 10/05/24 0500  WBC 12.8* 18.2*  --   --   --   --  15.2*  NEUTROABS  --  15.5*  --   --   --   --   --   HGB 10.3* 10.3* 10.9* 10.9* 10.2* 9.9* 9.1*  HCT 33.2* 33.0* 32.0* 32.0* 30.0* 29.0* 29.7*  MCV 99.4 101.9*  --   --   --   --  103.8*  PLT 796* 805*  --   --   --   --  697*    Basic Metabolic Panel: Recent Labs  Lab 10/02/24 0656 10/04/24 0902 10/04/24 0912 10/04/24 0914 10/05/24 0040 10/05/24 0200 10/05/24 0500   NA 137 139 137 137 136 137 139  K 4.0 4.9 4.7 4.8 4.2 4.0 4.4  CL 97* 100 100  --   --   --  103  CO2 25 26  --   --   --   --  27  GLUCOSE 106* 106* 106*  --   --   --  86  BUN 26* 32* 39*  --   --   --  28*  CREATININE 0.66 0.64 0.80  --   --   --  0.51  CALCIUM 9.1 9.1  --   --   --   --  7.7*   GFR: Estimated Creatinine Clearance: 56.4 mL/min (by C-G formula based on SCr of 0.51 mg/dL). Recent Labs  Lab 10/02/24 0656 10/04/24 0902 10/04/24 0916 10/04/24 1219 10/04/24 1607 10/05/24 0500  WBC 12.8* 18.2*  --   --   --  15.2*  LATICACIDVEN  --   --  2.7* 1.7 2.1*  --     Liver Function Tests: Recent Labs  Lab 10/02/24 0656 10/04/24 0902  AST 50* 44*  ALT 10 11  ALKPHOS 120 116  BILITOT 0.3 <0.2  PROT 6.4* 6.5  ALBUMIN 3.1* 3.2*   No results for input(s): LIPASE, AMYLASE in the last 168 hours. No results for input(s): AMMONIA in the last 168 hours.  ABG    Component Value Date/Time   PHART 7.14 (LL) 10/05/2024 1526   PCO2ART 93 (HH) 10/05/2024 1526   PO2ART 113 (H) 10/05/2024 1526   HCO3 31.7 (H) 10/05/2024 1526   TCO2 34 (H) 10/05/2024 0200   ACIDBASEDEF 0.1 10/05/2024 1526   O2SAT 99 10/05/2024 1526     Coagulation Profile: No results for input(s): INR, PROTIME in the last 168 hours.  Cardiac Enzymes: No results for input(s): CKTOTAL, CKMB, CKMBINDEX, TROPONINI in the last 168 hours.  HbA1C: No results found for: HGBA1C  CBG: No results for input(s): GLUCAP in the last 168 hours.  Review of Systems:   Unable to obtain as pt is encephalopathic.  Past Medical History:  She,  has a past medical history of Asthma, Breast cancer (HCC), Complication of anesthesia, Dyspnea, Dysrhythmia, Headache, History of kidney stones, Hypothyroidism, Pneumonia (01/26/2023), PONV (postoperative nausea and vomiting), rt breast ca (08/2022), Scoliosis, and Thyroid  disease.   Surgical History:   Past Surgical History:  Procedure Laterality Date    APPENDECTOMY     AXILLARY SENTINEL NODE BIOPSY Right 10/14/2022   Procedure: RIGHT AXILLARY SENTINEL NODE BIOPSY;  Surgeon: Ebbie Cough, MD;  Location: [facility] OR;  Service: General;  Laterality: Right;   BREAST BIOPSY Right 09/15/2022   US  RT BREAST BX W LOC DEV 1ST LESION IMG BX SPEC US  GUIDE 09/15/2022 [facility]   BREAST BIOPSY  10/12/2022   US  RT RADIOACTIVE SEED LOC 10/12/2022 [facility]   BREAST LUMPECTOMY WITH RADIOACTIVE SEED AND SENTINEL LYMPH NODE BIOPSY Right 10/14/2022   Procedure: RIGHT BREAST LUMPECTOMY WITH RADIOACTIVE SEED;  Surgeon: Ebbie Cough, MD;  Location: [facility] OR;  Service: General;  Laterality: Right;   COLONOSCOPY     IR PERC PLEURAL DRAIN W/INDWELL CATH W/IMG GUIDE  08/28/2024   IR RADIOLOGIST EVAL & MGMT  09/26/2024   IR THORACENTESIS ASP PLEURAL SPACE W/IMG GUIDE  09/07/2024   IR THORACENTESIS ASP PLEURAL SPACE W/IMG GUIDE  09/26/2024   IR THROMBECT PRIM MECH INIT (INCLU) MOD SED  08/25/2024   IR TUNNELED CENTRAL VENOUS CATH PLC W IMG  08/25/2024   IR US  GUIDE VASC ACCESS LEFT  08/25/2024   IR US  GUIDE VASC ACCESS RIGHT  08/25/2024   LAPAROSCOPIC APPENDECTOMY     MASTECTOMY Right    PORT-A-CATH REMOVAL Left 05/03/2023   Procedure: REMOVAL PORT-A-CATH;  Surgeon: Ebbie Cough, MD;  Location: [facility] OR;  Service: General;  Laterality: Left;   PORT-A-CATH REMOVAL Left 12/22/2023   Procedure: REMOVAL PORT-A-CATH;  Surgeon: Ebbie Cough, MD;  Location: [facility];  Service: General;  Laterality: Left;   PORTACATH PLACEMENT Left 10/14/2022   Procedure: INSERTION PORT-A-CATH;  Surgeon: Ebbie Cough, MD;  Location: [facility] OR;  Service: General;  Laterality: Left;   PORTACATH PLACEMENT Left 05/19/2023   Procedure: PORT PLACEMENT WITH ULTRASOUND GUIDANCE;  Surgeon: Ebbie Cough, MD;  Location: [facility];  Service: General;  Laterality: Left;   RE-EXCISION OF BREAST LUMPECTOMY Right 11/08/2022    Procedure: RE-EXCISION OF RIGHT BREAST LUMPECTOMY;  Surgeon: Ebbie Cough, MD;  Location: [facility];  Service: General;  Laterality: Right;   RE-EXCISION OF BREAST LUMPECTOMY Right 11/18/2022   Procedure: RE-EXCISION OF RIGHT BREAST LUMPECTOMY;  Surgeon: Ebbie Cough, MD;  Location: [facility] OR;  Service: General;  Laterality:  Right;   SIMPLE MASTECTOMY WITH AXILLARY SENTINEL NODE BIOPSY Right 05/03/2023   Procedure: RIGHT MASTECTOMY;  Surgeon: Ebbie Cough, MD;  Location: [facility] OR;  Service: General;  Laterality: Right;  GEN & PEC BLOCK   WISDOM TOOTH EXTRACTION       Social History:   reports that she has never smoked. She does not have any smokeless tobacco history on file. She reports that she does not currently use alcohol. She reports that she does not use drugs.   Family History:  Her family history includes Asthma in her sister; Clotting disorder in her paternal grandmother; Heart attack in her maternal grandfather and maternal grandmother; Hypertension in her father; Prostate cancer (age of onset: 32) in her brother; Prostate cancer (age of onset: 65) in her father; Thyroid  disease in her mother and sister.   Allergies Allergies[1]   Home Medications  Prior to Admission medications  Medication Sig Start Date End Date Taking? Authorizing Provider  acetaminophen  (TYLENOL ) 500 MG tablet Take 500 mg by mouth every 6 (six) hours as needed for moderate pain (pain score 4-6).   Yes [provider]  albuterol  (VENTOLIN  HFA) 108 (90 Base) MCG/ACT inhaler Inhale 2 puffs into the lungs every 4 (four) hours as needed for shortness of breath (Asthma). 05/30/22  Yes [provider]  APIXABAN  (ELIQUIS ) VTE STARTER PACK (10MG  AND 5MG ) Take as directed on package: start with two-5mg  tablets twice daily for 7 days. On day 8, switch to one-5mg  tablet twice daily. Patient taking differently: Take 5 mg by mouth 2 (two) times daily. 08/29/24  Yes Cheryle Page, MD   azithromycin  (ZITHROMAX ) 250 MG tablet Take 1 tablet (250 mg total) by mouth daily. Take 1 tablet daily starting 12/31 10/02/24  Yes Rogelia Jerilynn RAMAN, MD  cefTRIAXone  (ROCEPHIN ) 1 g injection Inject 1 g into the muscle once.   Yes [provider]  dexamethasone  (DECADRON ) 4 MG tablet Take 1 tablets  by mouth daily for 3 days starting the day after chemotherapy. Take with food. 07/25/24  Yes Gudena, Vinay, MD  diltiazem  (CARDIZEM  CD) 120 MG 24 hr capsule Take 120 mg by mouth 2 (two) times daily.   Yes [provider]  diltiazem  (CARDIZEM ) 30 MG tablet Take 30 mg by mouth 3 (three) times daily as needed (Tachycardia). 09/25/24  Yes [provider]  docusate sodium  (COLACE) 100 MG capsule Take 100 mg by mouth 2 (two) times daily as needed for mild constipation.   Yes [provider]  doxycycline (VIBRAMYCIN) 100 MG capsule Take 100 mg by mouth once.   Yes [provider]  furosemide (LASIX) 40 MG tablet Take 20 mg by mouth daily. 09/11/24  Yes [provider]  levothyroxine  (SYNTHROID ) 100 MCG tablet Take 100 mcg by mouth daily. 09/03/24  Yes [provider]  lidocaine -prilocaine  (EMLA ) cream Apply to affected area once 07/25/24  Yes Gudena, Vinay, MD  magnesium citrate SOLN Take 1 Bottle by mouth as needed for severe constipation or moderate constipation.   Yes [provider]  ondansetron  (ZOFRAN ) 8 MG tablet Take 1 tablet (8 mg total) by mouth every 8 (eight) hours as needed for nausea or vomiting. Start on the third day after chemotherapy. 07/25/24  Yes Odean Potts, MD  OVER THE COUNTER MEDICATION Take 1 capsule by mouth 2 (two) times daily. copaiba softgels   Yes [provider]  oxyCODONE  (OXY IR/ROXICODONE ) 5 MG immediate release tablet Take 1 tablet (5 mg total) by mouth every 6 (six)  hours as needed for severe pain (pain score 7-10). Patient taking differently: Take 2.5-5 mg by mouth every 4 (four) hours as  needed for severe pain (pain score 7-10). 07/30/24  Yes Gudena, Vinay, MD  polyethylene glycol (MIRALAX  / GLYCOLAX ) 17 g packet Take 17 g by mouth daily as needed for moderate constipation. 08/29/24  Yes Cheryle Page, MD  prochlorperazine  (COMPAZINE ) 10 MG tablet Take 1 tablet (10 mg total) by mouth every 6 (six) hours as needed for nausea or vomiting. 09/05/24  Yes Gudena, Vinay, MD  PULMICORT  FLEXHALER 180 MCG/ACT inhaler Inhale 1 puff into the lungs daily.   Yes [provider]  alendronate  (FOSAMAX ) 70 MG tablet Take 1 tablet (70 mg total) by mouth once a week. Take with a full glass of water  on an empty stomach. Patient not taking: Reported on 10/04/2024 12/21/23   Gudena, Vinay, MD  cefdinir (OMNICEF) 300 MG capsule Take 1 capsule (300 mg total) by mouth 2 (two) times daily for 7 days. Patient not taking: Reported on 10/04/2024 10/02/24 10/09/24  Rogelia Jerilynn RAMAN, MD  cetirizine (ZYRTEC) 10 MG tablet Take 10 mg by mouth daily. Patient not taking: Reported on 10/04/2024    [provider]      Sammi Gore, PA - [facility] For pager details, please see AMION or use Epic chat  After 1900, please call [facility] for cross coverage needs 10/05/2024, 4:41 PM       [1]  Allergies Allergen Reactions   Penicillins Rash    childhood reaction   "

## 2024-10-05 NOTE — Progress Notes (Signed)
 This RT palced patient on PCV on Bipap.   10/05/24 0046  BiPAP/CPAP/SIPAP  BiPAP/CPAP/SIPAP Pt Type Adult  BiPAP/CPAP/SIPAP SERVO  Mask Type Full face mask  Dentures removed? Not applicable  Mask Size Small  Set Rate 22 breaths/min  Respiratory Rate 22 breaths/min  IPAP 20 cmH20  EPAP 8 cmH2O  Pressure Support 12 cmH20  PEEP 8 cmH20  FiO2 (%) 60 %  Flow Rate 0 lpm  Minute Ventilation 9.1  Leak 70  Peak Inspiratory Pressure (PIP) 20  Tidal Volume (Vt) 312  Patient Home Machine No  Patient Home Mask No  Patient Home Tubing No  Press High Alarm 35 cmH2O

## 2024-10-05 NOTE — Care Management Obs Status (Signed)
 MEDICARE OBSERVATION STATUS NOTIFICATION   Patient Details  Name: Caitlin [name] MRN: [id number] Date of Birth: 10/26/1955   Medicare Observation Status Notification Given:  Yes    Debarah Saunas, RN 10/05/2024, 10:14 AM

## 2024-10-05 NOTE — Plan of Care (Signed)
  Problem: Education: Goal: Knowledge of General Education information will improve Description: Including pain rating scale, medication(s)/side effects and non-pharmacologic comfort measures Outcome: Progressing   Problem: Health Behavior/Discharge Planning: Goal: Ability to manage health-related needs will improve Outcome: Progressing   Problem: Clinical Measurements: Goal: Ability to maintain clinical measurements within normal limits will improve Outcome: Progressing Goal: Will remain free from infection Outcome: Progressing Goal: Cardiovascular complication will be avoided Outcome: Progressing   Problem: Coping: Goal: Level of anxiety will decrease Outcome: Progressing   Problem: Elimination: Goal: Will not experience complications related to bowel motility Outcome: Progressing Goal: Will not experience complications related to urinary retention Outcome: Progressing   Problem: Pain Managment: Goal: General experience of comfort will improve and/or be controlled Outcome: Progressing   Problem: Safety: Goal: Ability to remain free from injury will improve Outcome: Progressing   Problem: Skin Integrity: Goal: Risk for impaired skin integrity will decrease Outcome: Progressing

## 2024-10-05 NOTE — Progress Notes (Addendum)
 "  HD#0 SUBJECTIVE:  Patient Summary: Caitlin [name] is a 69 y.o. person living with a history of PMH of Afib, end-stage triple negative breast cancer s/p R total mastectomy on palliative chemo, lymphedema R arm s/p radiation, Hypothyroidism, recent PE 08/25/2024 s/p thrombectomy on Eliquis , previous episodes SVT, BL malignant pleural effusions s/p left PleurX catheter, and chronic hypoxic respiratory failure on 2 to 3 L oxygen baseline, asthma who presented with SOB and chest tightness and admitted for community-acquired pneumonia and SVT   Overnight Events: Respiratory acidosis with metabolic compensation; patient placed on BiPAP and PCCM consulted.  See note from Dr. Tan on 10/04/24 for more information.  Interim History:  Saw patient at bedside this a.m; pt accompanied by best friend who mentioned change in her code status. Pt reports Sob, CP, right arm swelling has been about the same. Denies any HA or other acute complaints.   OBJECTIVE:  Vital Signs: Vitals:   10/05/24 0530 10/05/24 0538 10/05/24 0612 10/05/24 0915  BP: 106/72  126/78 127/73  Pulse:   77 83  Resp:   20 (!) 21  Temp:  98.4 F (36.9 C)    TempSrc:  Axillary    SpO2:   100% 94%  Weight:      Height:       Supplemental O2: Room Air SpO2: 94 % FiO2 (%): 60 %  Filed Weights   10/04/24 0810  Weight: 60.3 kg     Intake/Output Summary (Last 24 hours) at 10/05/2024 0939 Last data filed at 10/04/2024 1130 Gross per 24 hour  Intake 248.53 ml  Output --  Net 248.53 ml   Net IO Since Admission: 248.53 mL [10/05/24 0939]  Physical Exam: Physical Exam Constitutional:      Comments: Ill appearing woman, resting in bed.   Cardiovascular:     Rate and Rhythm: Normal rate and regular rhythm.  Pulmonary:     Effort: Pulmonary effort is normal.     Breath sounds: Normal breath sounds.     Comments: Breath sounds normal-anteriorly  Musculoskeletal:     Right lower leg: No edema.     Left lower leg: No edema.      Comments: RUE swelling   Neurological:     Mental Status: She is alert and oriented to person, place, and time.     Comments: Speaking softly in full sentences with appearance of difficulty.   Psychiatric:        Mood and Affect: Mood normal.     Patient Lines/Drains/Airways Status     Active Line/Drains/Airways     Name Placement date Placement time Site Days   Peripheral IV 10/04/24 20 G 1.75 Left Antecubital 10/04/24  0901  Antecubital  1   Peripheral IV 10/04/24 20 G 1.88 Anterior;Left;Proximal;Upper Arm 10/04/24  1136  Arm  1   Chest Tube Left;Lateral Pleural 15.5 Fr. 08/28/24  1700  Pleural  38            Pertinent labs and imaging:      Latest Ref Rng & Units 10/05/2024    5:00 AM 10/05/2024    2:00 AM 10/05/2024   12:40 AM  CBC  WBC 4.0 - 10.5 K/uL 15.2     Hemoglobin 12.0 - 15.0 g/dL 9.1  9.9  89.7   Hematocrit 36.0 - 46.0 % 29.7  29.0  30.0   Platelets 150 - 400 K/uL 697          Latest Ref Rng & Units 10/05/2024  5:00 AM 10/05/2024    2:00 AM 10/05/2024   12:40 AM  CMP  Glucose 70 - 99 mg/dL 86     BUN 8 - 23 mg/dL 28     Creatinine 9.55 - 1.00 mg/dL 9.48     Sodium 864 - 854 mmol/L 139  137  136   Potassium 3.5 - 5.1 mmol/L 4.4  4.0  4.2   Chloride 98 - 111 mmol/L 103     CO2 22 - 32 mmol/L 27     Calcium 8.9 - 10.3 mg/dL 7.7       ECHOCARDIOGRAM COMPLETE Result Date: 10/04/2024    ECHOCARDIOGRAM REPORT   Patient Name:   Caitlin [name] Date of Exam: 10/04/2024 Medical Rec #:  [id number]        Height:       63.0 in Accession #:    [id number]       Weight:       133.0 lb Date of Birth:  1955/10/25       BSA:          1.626 m Patient Age:    51 years         BP:           133/81 mmHg Patient Gender: F                HR:           87 bpm. Exam Location:  Inpatient Procedure: 2D Echo, Cardiac Doppler and Color Doppler (Both Spectral and Color            Flow Doppler were utilized during procedure). Indications:    SVT (Supraventricular Tachycardia)  History:         Patient has prior history of Echocardiogram examinations, most                 recent 08/26/2024. Arrythmias:Atrial Fibrillation. Malignant                 neoplasm of upper-outer quadrant of right breast in female,                 estrogen receptor positive (HCC). S/P mastectomy, right. Hx of                 Pulmonary embolism (HCC). Port-A-Cath in place. Chronic                 anticoagulation.  Sonographer:    Aida Pizza RCS Referring Phys: [id number] GEORGANNA ARCHER IMPRESSIONS  1. Left ventricular ejection fraction, by estimation, is 60 to 65%. The left ventricle has normal function. The left ventricle has no regional wall motion abnormalities. There is mild concentric left ventricular hypertrophy. Left ventricular diastolic parameters were normal.  2. Right ventricular systolic function is normal. The right ventricular size is normal. There is normal pulmonary artery systolic pressure.  3. A small pericardial effusion is present. The pericardial effusion is circumferential. There is no evidence of cardiac tamponade. Large pleural effusion.  4. The mitral valve is normal in structure. Trivial mitral valve regurgitation. No evidence of mitral stenosis.  5. The aortic valve is tricuspid. Aortic valve regurgitation is mild. No aortic stenosis is present.  6. The inferior vena cava is normal in size with greater than 50% respiratory variability, suggesting right atrial pressure of 3 mmHg. Comparison(s): Changes from prior study are noted. Small circumferential pericardial effusion now present. FINDINGS  Left Ventricle: Left ventricular ejection fraction, by estimation, is 60 to  65%. The left ventricle has normal function. The left ventricle has no regional wall motion abnormalities. The left ventricular internal cavity size was normal in size. There is  mild concentric left ventricular hypertrophy. Left ventricular diastolic parameters were normal. Right Ventricle: The right ventricular size is normal. No  increase in right ventricular wall thickness. Right ventricular systolic function is normal. There is normal pulmonary artery systolic pressure. The tricuspid regurgitant velocity is 1.92 m/s, and  with an assumed right atrial pressure of 3 mmHg, the estimated right ventricular systolic pressure is 17.7 mmHg. Left Atrium: Left atrial size was normal in size. Right Atrium: Right atrial size was normal in size. Pericardium: A small pericardial effusion is present. The pericardial effusion is circumferential. There is no evidence of cardiac tamponade. Mitral Valve: The mitral valve is normal in structure. Trivial mitral valve regurgitation. No evidence of mitral valve stenosis. Tricuspid Valve: The tricuspid valve is normal in structure. Tricuspid valve regurgitation is mild . No evidence of tricuspid stenosis. Aortic Valve: The aortic valve is tricuspid. Aortic valve regurgitation is mild. No aortic stenosis is present. Aortic valve mean gradient measures 4.0 mmHg. Aortic valve peak gradient measures 9.2 mmHg. Aortic valve area, by VTI measures 2.35 cm. Pulmonic Valve: The pulmonic valve was normal in structure. Pulmonic valve regurgitation is not visualized. No evidence of pulmonic stenosis. Aorta: The aortic root and ascending aorta are structurally normal, with no evidence of dilitation. Venous: The inferior vena cava is normal in size with greater than 50% respiratory variability, suggesting right atrial pressure of 3 mmHg. IAS/Shunts: No atrial level shunt detected by color flow Doppler. Additional Comments: There is a large pleural effusion.  LEFT VENTRICLE PLAX 2D LVIDd:         4.00 cm   Diastology LVIDs:         2.70 cm   LV e' medial:    8.81 cm/s LV PW:         1.20 cm   LV E/e' medial:  12.9 LV IVS:        1.10 cm   LV e' lateral:   13.20 cm/s LVOT diam:     1.90 cm   LV E/e' lateral: 8.6 LV SV:         75 LV SV Index:   46 LVOT Area:     2.84 cm  RIGHT VENTRICLE RV S prime:     13.40 cm/s TAPSE  (M-mode): 1.9 cm LEFT ATRIUM             Index        RIGHT ATRIUM           Index LA diam:        2.40 cm 1.48 cm/m   RA Area:     13.70 cm LA Vol (A2C):   39.4 ml 24.23 ml/m  RA Volume:   35.70 ml  21.96 ml/m LA Vol (A4C):   36.7 ml 22.57 ml/m LA Biplane Vol: 38.1 ml 23.43 ml/m  AORTIC VALVE AV Area (Vmax):    2.40 cm AV Area (Vmean):   2.36 cm AV Area (VTI):     2.35 cm AV Vmax:           152.00 cm/s AV Vmean:          91.700 cm/s AV VTI:            0.318 m AV Peak Grad:      9.2 mmHg AV Mean Grad:  4.0 mmHg LVOT Vmax:         128.50 cm/s LVOT Vmean:        76.200 cm/s LVOT VTI:          0.264 m LVOT/AV VTI ratio: 0.83  AORTA Ao Root diam: 3.40 cm MITRAL VALVE                TRICUSPID VALVE MV Area (PHT): 3.21 cm     TR Peak grad:   14.7 mmHg MV Decel Time: 236 msec     TR Vmax:        192.00 cm/s MV E velocity: 114.00 cm/s MV A velocity: 82.30 cm/s   SHUNTS MV E/A ratio:  1.39         Systemic VTI:  0.26 m                             Systemic Diam: 1.90 cm Georganna Archer Electronically signed by Georganna Archer Signature Date/Time: 10/04/2024/5:35:59 PM    Final     ASSESSMENT/PLAN:  Assessment: Principal Problem:   Paroxysmal supraventricular tachycardia Active Problems:   Chronic anticoagulation   Community acquired pneumonia   Metastatic malignant neoplasm (HCC)   Plan:  CAP c/b Malignant Pleural Effusion CP and SOB still the same, however, WBC count shows improvement.  Will continue treating with Zosyn and doxycycline. - Zosyn every 8 hours: Day 2 - Doxycycline 100 mg twice daily: Day 2 -BC show NGTD  Acute Respiratory Acidosis with Metabolic Compensation Patient had critical blood gas readings overnight which were positive for respiratory acidosis with metabolic compensation along with altered mentation.  Patient was placed on BiPAP overnight and PCCM was consulted as her mentation showed no improvement.  Evaluated by PCCM who suggested that her lack of improvement was  likely due to inadequate respiratory rate and minute ventilation.  Lab results this a.m. showed improvement and upon our evaluation patient was A&O x 3.  Will monitor VBG results daily. 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  Options were limited due to her condition and hypertension.  Will continue with IV amiodarone, as recommended by cardiology. -IV amiodarone - Continue home Eliquis  -Check daily electrolytes  Hx PAF No signs of PAF during this admission. -Continue with Eliquis  - Continue with amiodarone, per above  Metastatic breast cancer CODE STATUS Patient has triple negative metastatic breast cancer s/p multiple lumpectomies and partial right mastectomy.  She has received chemo, radiation, immunotherapy.  Best friend read message from family regarding changing her CODE STATUS today.  Per message from family, wanted patient to be DNR but can use medications and shock for her SVT. Palliative care saw pt and informed that pt wanted husband to be with her while she confirmed final code status. Husband is resting at home and pt did not want to call him. Palliative is making arrangements to have discussion with pt and husband tomorrow regarding final plan and GOC.  Will confirm pt's CODE STATUS after palliative care has discussion with pt and husband.  Will continue with home medications to manage her pain. -Final CODE STATUS pending discussion with pt when husband is present; potentially tomorrow  - Palliative care following, appreciate recommendations -Tordol every 6 hours as needed - Tylenol  500 mg every 4 hours as needed   Peripheral Edema Lower extremity edema has improved after receiving a dose of Lasix yesterday 10/04/2024.  RUE  remains the same per patient which is likely due to her partial right mastectomy.  Will monitor for new changes.  Hx Chronic Constipation --Miralax  PRN  Macrocytic anemia  B12 levels are normal  and folate levels are high.  Hemoglobin levels are downtrending.  Will monitor levels for now. - Daily CBCs  Hypothyroidism  --Continue home synthroid  100 mcg daily   Best Practice: Diet: Normal VTE: Eliquis  IVF: None,None Code: Full   Disposition planning: Prior to Admission Living Arrangement: Home, living with husband Anticipated Discharge Location: Home   Dispo: Admit patient to Observation with expected length of stay > than 2 midnights.    Signature:  Rebecka Edgardo [facility]  9:39 AM, 10/05/2024  On Call pager [phone]  "

## 2024-10-05 NOTE — ED Notes (Signed)
 Assisted pt with bedside commode. Family at bedside.

## 2024-10-05 NOTE — Progress Notes (Signed)
 At bedside for PIV insertion. Pt currently has 1 PIV infusing in the upper left arm.and 1 in the LAC with good blood return and flushed easily. R arm restriction. Currently, no third PIV inserted.

## 2024-10-05 NOTE — ED Notes (Signed)
 Notified Dr. Marylu of critical blood gas of PH 7.17 and cos of 89

## 2024-10-05 NOTE — Consult Note (Signed)
 "                                                                                Consultation Note Date: 10/05/2024   Patient Name: Caitlin [name]  DOB: Jul 22, 1956  MRN: [id number]  Age / Sex: 69 y.o., female  PCP: Toribio Jerel MATSU, MD Referring Physician: Eben Reyes BROCKS, MD  Reason for Consultation: Establishing goals of care, Metastatic Breast Cancer - terminal with palliative chemotherapy  HPI/Patient Profile: 69 y.o. 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   Of note, patient has had 2 admissions and 2 ED visits in the last 6 months.  Clinical Assessment and Goals of Care: I have reviewed medical records including: EPIC notes: Nursing, TOC, medicine, cardiology. MAR: On IV antibiotics.  As needed medications administered in the last 24 hours-oxycodone  x 2, Tylenol  x 2 Available advanced directives in ACP: None Labs: Creatinine 0.51, critically elevated lactic acid 2.1, elevated WBC 15.2, low hemoglobin 9.1, albumin 3.2 assessed for overall health, nutritional status, and disease severity, helping predict prognosis and guide care   Received report from primary RN -no acute concerns.  Per RN, patient has recently complained of pain for which she has recently administered Tylenol .  Santina to visit patient at bedside-best friend/[name] present.  Patient gives permission to speak freely in front of [place].  Patient is lying in bed awake, alert, oriented, and able to participate in conversation; however, speech is limited by dyspnea at rest. Signs and  non-verbal gestures of discomfort noted. No respiratory distress, increased work of breathing, or secretions noted.  Patient is on 5 L O2 nasal cannula -baseline is 2 to 3 L.   --------------------------------------------------------------------------------------------  Advance Care Planning Conversation   Pertinent diagnoses: Metastatic breast cancer status post partial mastectomy on palliative chemotherapy, left lower lobe pneumonia, sepsis  The patient and/or family consented to a voluntary Advance Care Planning conversation in person. Individuals present for the conversation: Patient, husband, friend/[name]  Summary of the conversation:   Met with patient and her friend/[name] to discuss diagnosis, prognosis, GOC, EOL wishes, disposition, and options.  I introduced Palliative Medicine as specialized medical care for people living with serious illness. It focuses on providing relief from the symptoms and stress of a serious illness. The goal is to improve quality of life for both the patient and the family.  We discussed a brief life review of the patient as well as functional and nutritional status.  Patient and her husband have been married for over 40 years -they have 5 adult children.  Prior to hospitalization, patient was living in a private residence with her husband.  She was able to perform ALDs and ambulated with a walker.  Her appetite prior to hospitalization was described as poor.  We discussed patient's current illness and what it  means in the larger context of patient's on-going co-morbidities.  Patient has a clear understanding of her current acute medical situation.  Natural disease trajectory and expectations at EOL were discussed. I attempted to elicit values and goals of care important to the patient. The difference between aggressive medical intervention and comfort care was considered in light of the patient's goals of care.  Patient indicates she wishes to complete full  goals of care with her husband present.  He has currently gone home to rest and she does not want to to call him at this time.  She also indicates she would want him here in person not via speakerphone.  Attempted to gain clarity into code status decisions as it was noted that patient and family are interested in DNR/DNI.  Patient requested to read a text message from her friend/[name] that was sent from her husband earlier this morning.  After reading this text message, patient indicates she wishes to continue full code until her husband is here in person to confirm previous DNR/DNI discussions.   Will attempt to schedule a meeting with patient and patient's husband tomorrow.  Discussed with patient/family/friend the importance of continued conversation with each other and the medical providers regarding overall plan of care and treatment options, ensuring decisions are within the context of the patients values and GOCs.    Questions and concerns were addressed. The patient/family/friend was encouraged to call with questions and/or concerns. PMT card was provided with request for husband to call to schedule meeting.  1:00 PM Received notification that patient's husband/[name] called PMT phone.   Returned [employer] phone call -emotional support provided.  Deward is a local family physician.  He indicates patient has a living will and they wish to respect her wishes.  Patient, Deward, and her family/friends understand that she has terminal cancer.  Deward confirms goals for code status changed to DNR/DNI today; however, with the note if there is a treatable arrhythmia (because patient has had recurrent SVT), she would want medications or shocking in this instance. We discussed that DNR only comes into play if she's pulseless, therefore medications/shocking would be considered part of her care if needed.    Deward indicates that they have started discussions about hospice, but have not pulled the trigger yet.   They are interested in hospice services; however, are not sure if she would qualify while receiving palliative immunotherapy.  We discussed that patient is receiving palliative chemotherapy to decrease swelling in her right arm as well as decrease her pain.  Discussed that sometimes hospice organizations consider enrolling patients receiving palliative cancer treatments on an individual basis -could explore if there are any options if they are interested.  Deward indicates their hospice organization preference would be with [facility] as they have utilized them in the past with other family members and had a positive experience.  Deward will discuss this with patient overnight and request to meet again tomorrow.  Scheduled follow-up meeting for tomorrow at 9 AM.  Prognosis reviewed with husband likely be less than 6 months.  While admitted continue current plan of care to treat the treatable.   Outcome of the conversation: - Continue to treat the treatable now DNR/DNI -considering hospice at discharge pending enrollment eligibility while receiving palliative chemotherapy  I spent 30 minutes providing separately identifiable ACP services with the patient and/or surrogate decision maker in a voluntary, in-person conversation discussing the patient's wishes and goals as detailed in the above note.  Primary Decision Maker: PATIENT  with assistance from her husband/[name]    SUMMARY OF RECOMMENDATIONS   Continue to treat the treatable Now DNR/DNI Patient/husband considering hospice at discharge pending enrollment eligibility while receiving palliative chemotherapy Follow-up family meeting scheduled for tomorrow 1/3 at 9 AM for ongoing hospice discussions PMT will continue to follow and support holistically   Code Status/Advance Care Planning: DNR  Palliative Prophylaxis:  Bowel Regimen, Frequent Pain Assessment, and Turn Reposition  Additional Recommendations (Limitations, Scope, Preferences): No  Tracheostomy  Psycho-social/Spiritual:  Desire for further Chaplaincy support:no Created space and opportunity for patient and family to express thoughts and feelings regarding patient's current medical situation.  Emotional support and therapeutic listening provided.  Prognosis:  < 6 months  Discharge Planning: To Be Determined      Primary Diagnoses: Present on Admission:  Community acquired pneumonia   I have reviewed the medical record, interviewed the patient and family, and examined the patient. The following aspects are pertinent.  Past Medical History:  Diagnosis Date   Asthma    Breast cancer (HCC)    right   Complication of anesthesia    Dyspnea    Dysrhythmia    2012   Headache    History of kidney stones    passed   Hypothyroidism    Pneumonia 01/26/2023   PONV (postoperative nausea and vomiting)    rt breast ca 08/2022   Right Breast   Scoliosis    Thyroid  disease    Social History   Socioeconomic History   Marital status: Married    Spouse name: Not on file   Number of children: Not on file   Years of education: Not on file   Highest education level: Not on file  Occupational History   Not on file  Tobacco Use   Smoking status: Never   Smokeless tobacco: Not on file  Vaping Use   Vaping status: Never Used  Substance and Sexual Activity   Alcohol use: Not Currently   Drug use: Never   Sexual activity: Not Currently  Other Topics Concern   Not on file  Social History Narrative   Not on file   Social Drivers of Health   Tobacco Use: Unknown (10/04/2024)   Patient History    Smoking Tobacco Use: Never    Smokeless Tobacco Use: Unknown    Passive Exposure: Not on file  Financial Resource Strain: Low Risk (09/03/2024)   Received from [facility]   Overall Financial Resource Strain (CARDIA)    How hard is it for you to pay for the very basics like food, housing, medical care, and heating?: Not hard at all  Food Insecurity: No Food  Insecurity (09/03/2024)   Received from [facility]   Epic    Within the past 12 months, you worried that your food would run out before you got the money to buy more.: Never true    Within the past 12 months, the food you bought just didn't last and you didn't have money to get more.: Never true  Transportation Needs: No Transportation Needs (09/03/2024)   Received from [facility] - Transportation    Lack of Transportation (Medical): No    Lack of Transportation (Non-Medical): No  Physical Activity: Inactive (09/03/2024)   Received from [facility]   Exercise Vital Sign    On average, how many days per week do you engage in moderate to strenuous exercise (like a brisk walk)?: 0 days    On  average, how many minutes do you engage in exercise at this level?: 0 min  Stress: Stress Concern Present (09/03/2024)   Received from [facility] - Occupational Stress Questionnaire    Do you feel stress - tense, restless, nervous, or anxious, or unable to sleep at night because your mind is troubled all the time - these days?: To some extent  Social Connections: Socially Integrated (09/03/2024)   Received from [facility]   Social Connection and Isolation Panel    In a typical week, how many times do you talk on the phone with family, friends, or neighbors?: More than three times a week    How often do you get together with friends or relatives?: More than three times a week    How often do you attend church or religious services?: More than 4 times per year    Do you belong to any clubs or organizations such as church groups, unions, fraternal or athletic groups, or school groups?: Yes    How often do you attend meetings of the clubs or organizations you belong to?: More than 4 times per year    Are you married, widowed, divorced, separated, never married, or living with a partner?: Married  Depression (PHQ2-9): Low Risk (05/27/2023)    Depression (PHQ2-9)    PHQ-2 Score: 0  Alcohol Screen: Low Risk (05/27/2023)   Alcohol Screen    Last Alcohol Screening Score (AUDIT): 0  Housing: Low Risk (08/26/2024)   Epic    Unable to Pay for Housing in the Last Year: No    Number of Times Moved in the Last Year: 0    Homeless in the Last Year: No  Utilities: Not At Risk (08/26/2024)   Epic    Threatened with loss of utilities: No  Health Literacy: Low Risk (11/12/2022)   Received from [facility] Literacy    How often do you need to have someone help you when you read instructions, pamphlets, or other written material from your doctor or pharmacy?: Never   Family History  Problem Relation Age of Onset   Thyroid  disease Mother    Prostate cancer Father 21       metastatic   Hypertension Father    Thyroid  disease Sister        twin sister age 56   Asthma Sister    Prostate cancer Brother 8   Heart attack Maternal Grandmother        died age 36's   Heart attack Maternal Grandfather        died age 34's   Clotting disorder Paternal Grandmother        cerebral hemorrage   Scheduled Meds:  apixaban   5 mg Oral BID   budesonide  (PULMICORT ) nebulizer solution  0.25 mg Nebulization Daily   doxycycline  100 mg Oral Q12H   levothyroxine   100 mcg Oral Q0600   Continuous Infusions:  amiodarone 30 mg/hr (10/05/24 0010)   piperacillin-tazobactam (ZOSYN)  IV 3.375 g (10/05/24 0657)   PRN Meds:.acetaminophen  **OR** acetaminophen , albuterol , docusate sodium , ketorolac , polyethylene glycol Medications Prior to Admission:  Prior to Admission medications  Medication Sig Start Date End Date Taking? Authorizing Provider  acetaminophen  (TYLENOL ) 500 MG tablet Take 500 mg by mouth every 6 (six) hours as needed for moderate pain (pain score 4-6).   Yes [provider]  albuterol  (VENTOLIN  HFA) 108 (90 Base) MCG/ACT inhaler Inhale 2 puffs into the  lungs every 4 (four) hours as needed for shortness of breath  (Asthma). 05/30/22  Yes [provider]  APIXABAN  (ELIQUIS ) VTE STARTER PACK (10MG  AND 5MG ) Take as directed on package: start with two-5mg  tablets twice daily for 7 days. On day 8, switch to one-5mg  tablet twice daily. Patient taking differently: Take 5 mg by mouth 2 (two) times daily. 08/29/24  Yes Cheryle Page, MD  azithromycin  (ZITHROMAX ) 250 MG tablet Take 1 tablet (250 mg total) by mouth daily. Take 1 tablet daily starting 12/31 10/02/24  Yes Rogelia Jerilynn RAMAN, MD  cefTRIAXone  (ROCEPHIN ) 1 g injection Inject 1 g into the muscle once.   Yes [provider]  dexamethasone  (DECADRON ) 4 MG tablet Take 1 tablets  by mouth daily for 3 days starting the day after chemotherapy. Take with food. 07/25/24  Yes Gudena, Vinay, MD  diltiazem  (CARDIZEM  CD) 120 MG 24 hr capsule Take 120 mg by mouth 2 (two) times daily.   Yes [provider]  diltiazem  (CARDIZEM ) 30 MG tablet Take 30 mg by mouth 3 (three) times daily as needed (Tachycardia). 09/25/24  Yes [provider]  docusate sodium  (COLACE) 100 MG capsule Take 100 mg by mouth 2 (two) times daily as needed for mild constipation.   Yes [provider]  doxycycline (VIBRAMYCIN) 100 MG capsule Take 100 mg by mouth once.   Yes [provider]  furosemide (LASIX) 40 MG tablet Take 20 mg by mouth daily. 09/11/24  Yes [provider]  levothyroxine  (SYNTHROID ) 100 MCG tablet Take 100 mcg by mouth daily. 09/03/24  Yes [provider]  lidocaine -prilocaine  (EMLA ) cream Apply to affected area once 07/25/24  Yes Gudena, Vinay, MD  magnesium citrate SOLN Take 1 Bottle by mouth as needed for severe constipation or moderate constipation.   Yes [provider]  ondansetron  (ZOFRAN ) 8 MG tablet Take 1 tablet (8 mg total) by mouth every 8 (eight) hours as needed for nausea or vomiting. Start on the third day after chemotherapy. 07/25/24  Yes Odean Potts, MD  OVER THE COUNTER MEDICATION Take 1  capsule by mouth 2 (two) times daily. copaiba softgels   Yes [provider]  oxyCODONE  (OXY IR/ROXICODONE ) 5 MG immediate release tablet Take 1 tablet (5 mg total) by mouth every 6 (six) hours as needed for severe pain (pain score 7-10). Patient taking differently: Take 2.5-5 mg by mouth every 4 (four) hours as needed for severe pain (pain score 7-10). 07/30/24  Yes Gudena, Vinay, MD  polyethylene glycol (MIRALAX  / GLYCOLAX ) 17 g packet Take 17 g by mouth daily as needed for moderate constipation. 08/29/24  Yes Cheryle Page, MD  prochlorperazine  (COMPAZINE ) 10 MG tablet Take 1 tablet (10 mg total) by mouth every 6 (six) hours as needed for nausea or vomiting. 09/05/24  Yes Gudena, Vinay, MD  PULMICORT  FLEXHALER 180 MCG/ACT inhaler Inhale 1 puff into the lungs daily.   Yes [provider]  alendronate  (FOSAMAX ) 70 MG tablet Take 1 tablet (70 mg total) by mouth once a week. Take with a full glass of water  on an empty stomach. Patient not taking: Reported on 10/04/2024 12/21/23   Gudena, Vinay, MD  cefdinir (OMNICEF) 300 MG capsule Take 1 capsule (300 mg total) by mouth 2 (two) times daily for 7 days. Patient not taking: Reported on 10/04/2024 10/02/24 10/09/24  Rogelia Jerilynn RAMAN, MD  cetirizine (ZYRTEC) 10 MG tablet Take 10 mg by mouth daily. Patient not taking: Reported on 10/04/2024    [provider]   Allergies[1] Review of Systems  Constitutional:  Positive for activity change, appetite change and fatigue.  Respiratory:  Positive for shortness of breath.   Gastrointestinal:  Negative for nausea and vomiting.  Neurological:  Positive for weakness.  All other systems reviewed and are negative.   Physical Exam Vitals and nursing note reviewed.  Constitutional:      General: She is not in acute distress.    Appearance: She is ill-appearing.  Pulmonary:     Effort: Pulmonary effort is normal. No respiratory distress.     Comments: Dyspnea at rest Skin:    General:  Skin is warm and dry.  Neurological:     Mental Status: She is alert and oriented to person, place, and time.     Motor: Weakness present.  Psychiatric:        Attention and Perception: Attention normal.        Behavior: Behavior is cooperative.        Cognition and Memory: Cognition and memory normal.     Vital Signs: BP 120/71   Pulse 81   Temp 98.4 F (36.9 C) (Axillary)   Resp 14   Ht 5' 3 (1.6 m)   Wt 60.3 kg   SpO2 94%   BMI 23.56 kg/m  Pain Scale: 0-10   Pain Score: 8    SpO2: SpO2: 94 % O2 Device:SpO2: 94 % O2 Flow Rate: .   IO: Intake/output summary:  Intake/Output Summary (Last 24 hours) at 10/05/2024 1022 Last data filed at 10/04/2024 1130 Gross per 24 hour  Intake 248.53 ml  Output --  Net 248.53 ml    LBM:   Baseline Weight: Weight: 60.3 kg Most recent weight: Weight: 60.3 kg     Palliative Assessment/Data: PPS 40%    Billing based on MDM: High  Problems Addressed: One acute or chronic illness or injury that poses a threat to life or bodily function  Amount and/or Complexity of Data: Category 1:Review of prior external note(s) from each unique source, Review of the result(s) of each unique test, and Assessment requiring an independent historian(s) and Category 3:Discussion of management or test interpretation with external physician/other qualified health care professional/appropriate source (not separately reported)  Risks: Decision not to resuscitate or to de-escalate care because of poor prognosis   Signed by: Jeoffrey CHRISTELLA Sharps, NP   Please contact Palliative Medicine Team phone at [phone] for questions and concerns.  For individual provider: See Amion  *Portions of this note are a verbal dictation therefore any spelling and/or grammatical errors are due to the Dragon Medical One system interpretation.       [1]  Allergies Allergen Reactions   Penicillins Rash    childhood reaction   "

## 2024-10-05 NOTE — ED Notes (Signed)
 Handoff report given to Jakeema, CHARITY FUNDRAISER.

## 2024-10-05 NOTE — Procedures (Signed)
 Thoracentesis  Procedure Note  MARTAVIA TYE  [id number]  04-Feb-1956  Date:10/05/2024  Time:4:42 PM   Provider Performing:Dicky Boer A Nichlas Pitera   Procedure: Thoracentesis with imaging guidance (67444)  Indication(s) Pleural Effusion  Consent Risks of the procedure as well as the alternatives and risks of each were explained to the patient and/or caregiver.  Consent for the procedure was obtained and is signed in the bedside chart  Anesthesia Topical only with 1% lidocaine     Time Out Verified patient identification, verified procedure, site/side was marked, verified correct patient position, special equipment/implants available, medications/allergies/relevant history reviewed, required imaging and test results available.   Sterile Technique Maximal sterile technique including full sterile barrier drape, hand hygiene, sterile gown, sterile gloves, mask, hair covering, sterile ultrasound probe cover (if used).  Procedure Description Ultrasound was used to identify appropriate pleural anatomy for placement and overlying skin marked.  Area of drainage cleaned and draped in sterile fashion. Lidocaine  was used to anesthetize the skin and subcutaneous tissue.  1300 cc's of dark amber appearing fluid was drained from the right pleural space. Catheter then removed and bandaid applied to site.   Complications/Tolerance None; patient tolerated the procedure well. Chest X-ray is ordered to confirm no post-procedural complication.   EBL Minimal   Specimen(s) Pleural fluid

## 2024-10-05 NOTE — Progress Notes (Signed)
\ ° °  I was called by the patient's husband to come see his wife.  Sats were in the high 80s and she is back in fib.  CXR with worsening airspace disease.  I have sent a note to the primary team.  I will give IV Lasix x 1 and amio bolus.   BiPAP has been ordered.

## 2024-10-05 NOTE — Progress Notes (Signed)
" ° °  Brief Progress Note   _____________________________________________________________________________________________________________  Patient Name: Caitlin [name] Patient DOB: 27-Jan-1956 Date: @TODAY @      Data: 69 y.o. female currently awaiting admission to a Progressive bed at [facility].    Action: Reviewed the patient's vital signs, lab results, and clinical notes to assess if patient appropriate for downgraded to Telemetry.    Response:  Pt currently requiring Bipap. Will need a Progressive bed.  _____________________________________________________________________________________________________________  [facility] RN Expeditor Jasiel Belisle S Modest Draeger Please contact us  directly via secure chat (search for [facility]) or by calling us  at [phone] [facility]).  "

## 2024-10-05 NOTE — Progress Notes (Signed)
 RT note. Patient placed on bipap at this time per MD, ABG collected and sent to lab, lab notified. MD aware, RT will continue to monitor.    10/05/24 1541  BiPAP/CPAP/SIPAP  $ Non-Invasive Ventilator  Non-Invasive Vent Initial  $ Face Mask Medium Yes  BiPAP/CPAP/SIPAP Pt Type Adult  BiPAP/CPAP/SIPAP SERVO  Mask Type Full face mask  Dentures removed? No  Mask Size Medium  Set Rate 24 breaths/min  Respiratory Rate 25 breaths/min  IPAP 18 cmH20  EPAP 7 cmH2O  Pressure Support 11 cmH20  PEEP 7 cmH20  FiO2 (%) 50 %  Minute Ventilation 8.1  Leak 50  Peak Inspiratory Pressure (PIP) 18  Tidal Volume (Vt) 355  Patient Home Machine No  Patient Home Mask No  Patient Home Tubing No  Auto Titrate No  Press High Alarm 35 cmH2O  Nasal massage performed Yes  CPAP/SIPAP surface wiped down Yes  Device Plugged into RED Power Outlet Yes  Oxygen Percent 50 %  BiPAP/CPAP /SiPAP Vitals  Pulse Rate (!) 112  SpO2 94 %  MEWS Score/Color  MEWS Score 2  MEWS Score Color Yellow

## 2024-10-06 ENCOUNTER — Inpatient Hospital Stay (HOSPITAL_COMMUNITY)

## 2024-10-06 DIAGNOSIS — J9602 Acute respiratory failure with hypercapnia: Secondary | ICD-10-CM | POA: Diagnosis not present

## 2024-10-06 DIAGNOSIS — J189 Pneumonia, unspecified organism: Secondary | ICD-10-CM

## 2024-10-06 DIAGNOSIS — I480 Paroxysmal atrial fibrillation: Secondary | ICD-10-CM

## 2024-10-06 DIAGNOSIS — A419 Sepsis, unspecified organism: Secondary | ICD-10-CM | POA: Diagnosis not present

## 2024-10-06 DIAGNOSIS — R531 Weakness: Secondary | ICD-10-CM

## 2024-10-06 DIAGNOSIS — E039 Hypothyroidism, unspecified: Secondary | ICD-10-CM

## 2024-10-06 DIAGNOSIS — D75839 Thrombocytosis, unspecified: Secondary | ICD-10-CM

## 2024-10-06 DIAGNOSIS — C50911 Malignant neoplasm of unspecified site of right female breast: Secondary | ICD-10-CM

## 2024-10-06 DIAGNOSIS — C799 Secondary malignant neoplasm of unspecified site: Secondary | ICD-10-CM | POA: Diagnosis not present

## 2024-10-06 DIAGNOSIS — I471 Supraventricular tachycardia, unspecified: Secondary | ICD-10-CM | POA: Diagnosis not present

## 2024-10-06 DIAGNOSIS — E8729 Other acidosis: Secondary | ICD-10-CM

## 2024-10-06 DIAGNOSIS — Z515 Encounter for palliative care: Secondary | ICD-10-CM | POA: Diagnosis not present

## 2024-10-06 DIAGNOSIS — J910 Malignant pleural effusion: Secondary | ICD-10-CM | POA: Diagnosis not present

## 2024-10-06 DIAGNOSIS — J9621 Acute and chronic respiratory failure with hypoxia: Secondary | ICD-10-CM | POA: Diagnosis not present

## 2024-10-06 DIAGNOSIS — D638 Anemia in other chronic diseases classified elsewhere: Secondary | ICD-10-CM

## 2024-10-06 LAB — BLOOD GAS, VENOUS
Acid-Base Excess: 6.4 mmol/L — ABNORMAL HIGH (ref 0.0–2.0)
Bicarbonate: 30.5 mmol/L — ABNORMAL HIGH (ref 20.0–28.0)
Drawn by: 6041
O2 Saturation: 94 %
Patient temperature: 36.9
pCO2, Ven: 41 mmHg — ABNORMAL LOW (ref 44–60)
pH, Ven: 7.48 — ABNORMAL HIGH (ref 7.25–7.43)
pO2, Ven: 61 mmHg — ABNORMAL HIGH (ref 32–45)

## 2024-10-06 LAB — BASIC METABOLIC PANEL WITH GFR
Anion gap: 12 (ref 5–15)
BUN: 38 mg/dL — ABNORMAL HIGH (ref 8–23)
CO2: 30 mmol/L (ref 22–32)
Calcium: 8.3 mg/dL — ABNORMAL LOW (ref 8.9–10.3)
Chloride: 100 mmol/L (ref 98–111)
Creatinine, Ser: 0.87 mg/dL (ref 0.44–1.00)
GFR, Estimated: >60 mL/min
Glucose, Bld: 80 mg/dL (ref 70–99)
Potassium: 4 mmol/L (ref 3.5–5.1)
Sodium: 142 mmol/L (ref 135–145)

## 2024-10-06 LAB — CBC
HCT: 30.5 % — ABNORMAL LOW (ref 36.0–46.0)
Hemoglobin: 9.8 g/dL — ABNORMAL LOW (ref 12.0–15.0)
MCH: 31.8 pg (ref 26.0–34.0)
MCHC: 32.1 g/dL (ref 30.0–36.0)
MCV: 99 fL (ref 80.0–100.0)
Platelets: 710 K/uL — ABNORMAL HIGH (ref 150–400)
RBC: 3.08 MIL/uL — ABNORMAL LOW (ref 3.87–5.11)
RDW: 17.2 % — ABNORMAL HIGH (ref 11.5–15.5)
WBC: 14.7 K/uL — ABNORMAL HIGH (ref 4.0–10.5)
nRBC: 0 % (ref 0.0–0.2)

## 2024-10-06 LAB — MAGNESIUM: Magnesium: 2.2 mg/dL (ref 1.7–2.4)

## 2024-10-06 MED ORDER — AMIODARONE HCL 200 MG PO TABS: 200.0000 mg | ORAL_TABLET | Freq: Every day | ORAL | Status: DC

## 2024-10-06 MED ORDER — SENNOSIDES-DOCUSATE SODIUM 8.6-50 MG PO TABS
1.0000 | ORAL_TABLET | Freq: Two times a day (BID) | ORAL | Status: DC
  Administered 2024-10-06: 1 via ORAL
  Filled 2024-10-06 (×2): qty 1

## 2024-10-06 MED ORDER — AMIODARONE HCL 200 MG PO TABS: 400.0000 mg | ORAL_TABLET | Freq: Every day | ORAL | Status: DC

## 2024-10-06 MED ORDER — OXYCODONE HCL 5 MG PO TABS
5.0000 mg | ORAL_TABLET | Freq: Four times a day (QID) | ORAL | Status: DC | PRN
  Administered 2024-10-06 – 2024-10-09 (×3): 5 mg via ORAL
  Filled 2024-10-06 (×3): qty 1

## 2024-10-06 MED ORDER — POLYETHYLENE GLYCOL 3350 17 G PO PACK
17.0000 g | PACK | Freq: Every day | ORAL | Status: DC
  Administered 2024-10-06: 17 g via ORAL
  Filled 2024-10-06: qty 1

## 2024-10-06 MED ORDER — OXYCODONE HCL 5 MG PO TABS
2.5000 mg | ORAL_TABLET | Freq: Four times a day (QID) | ORAL | Status: DC | PRN
  Administered 2024-10-08 – 2024-10-09 (×4): 2.5 mg via ORAL
  Filled 2024-10-06 (×4): qty 1

## 2024-10-06 MED ORDER — ENOXAPARIN SODIUM 60 MG/0.6ML IJ SOSY
1.0000 mg/kg | PREFILLED_SYRINGE | Freq: Two times a day (BID) | INTRAMUSCULAR | Status: AC
  Administered 2024-10-07 (×2): 60 mg via SUBCUTANEOUS
  Filled 2024-10-06 (×2): qty 0.6

## 2024-10-06 MED ORDER — AMIODARONE HCL 200 MG PO TABS
400.0000 mg | ORAL_TABLET | Freq: Two times a day (BID) | ORAL | Status: DC
  Administered 2024-10-06 – 2024-10-09 (×7): 400 mg via ORAL
  Filled 2024-10-06 (×7): qty 2

## 2024-10-06 NOTE — Progress Notes (Signed)
 Pharmacy Progress Note  Notified by RN that Zosyn  and amiodarone  were run together early this morning, which are incompatible with each other. RN verified that no crystals or other precipitates were observed and line was still good. No adverse effects noted from incompatible co-infusion. Given some of the Zosyn  could have crystallized due to incompatibility leading to suboptimal drug concentrations, moved up the next dose of Zosyn  to now.   Maurilio Patten, PharmD PGY1 Pharmacy Resident [facility] 10/06/2024 12:19 PM

## 2024-10-06 NOTE — Progress Notes (Addendum)
 10/06/2024  Seen in f/u for type 1/2 resp failure and effusions  S: No events, improved with BIPAP + thora.  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 Atilano, will plan on lovenox  bridging (dose tonight and tomorrow am), palliative R pleurX placement and L pleurX removal Monday Provided no major bleeding can restart eliquis  Monday evening NIV PRN if helps and patient willing Will do procedure in endo if there is space otherwise can do at bedside Goal home with hospice Tuesday or Wednesday Will touch base with family/patient Monday   Rolan Sharps MD Pulmonary Critical Care Medicine Securechat if during day (7a-7p) [phone] if after hours (7p-7a)

## 2024-10-06 NOTE — Evaluation (Signed)
 Physical Therapy Evaluation Patient Details Name: Caitlin [name] MRN: [id number] DOB: 07-21-1956 Today's Date: 10/06/2024  History of Present Illness  Pt is a 69 y.o. F presenting [facility] with chest tightness, sweats, and cough. PMH is significant for A-fib, end-stage triple negative breast CA s/p R total mastectomy on palliative chemo, lymphedema R arm s/p radiation, hypothyroidism, PE, SVT, BL malignant pleural effusions, and chronic hypoxic respiratory failure on 2-3L oxygen baseline.    Clinical Impression  Prior to one week ago, pt was mod I with mobility, utilizing rollator and was independent with ADLs. Pt's spouse reports for the past week, she has been using her rollator as a WC for mobility within the house. Pt presents to evaluation with deficits in mobility, strength, power, activity tolerance, pain, and balance. Pt was able to perform bed mobility and sit to stand with moderate physical assistance and stand-step transfer to [facility] with one person HHA. Further mobility deferred due to pt reporting significant fatigue. Discussed possible DME needs and current level of assistance pt is needing with pt's spouse reporting he feels he is able to provide level of assist currently needed. PT will continue to treat pt while she is admitted. Recommending HHPT at discharge to address remaining mobility deficits and optimize return to PLOF.         If plan is discharge home, recommend the following: A little help with walking and/or transfers;A little help with bathing/dressing/bathroom;Assistance with cooking/housework;Assist for transportation   Can travel by private vehicle        Equipment Recommendations None recommended by PT  Recommendations for Other Services  OT consult    Functional Status Assessment Patient has had a recent decline in their functional status and demonstrates the ability to make significant improvements in function in a reasonable and predictable amount of  time.     Precautions / Restrictions Precautions Precautions: Fall Recall of Precautions/Restrictions: Intact Restrictions Weight Bearing Restrictions Per Provider Order: No      Mobility  Bed Mobility Overal bed mobility: Needs Assistance Bed Mobility: Supine to Sit, Sit to Supine     Supine to sit: Mod assist, HOB elevated Sit to supine: Mod assist   General bed mobility comments: For supine to sit on the L side, pt is able to advance BLEs towards EOB, but requires physical assistance for trunk management. Pt is then able to brace BUE to assist with obtaining neutral position for hips. For sit to supine, pt requires physical assistance for BLE management. VC given for sequencing; increased time to complete.    Transfers Overall transfer level: Needs assistance Equipment used: 1 person hand held assist Transfers: Sit to/from Stand, Bed to chair/wheelchair/BSC Sit to Stand: Mod assist   Step pivot transfers: Min assist       General transfer comment: Pt completed STS from EOB w/ moderate physical assistance for initial power up and therapist anterior to patient, and STS from [facility] with minimal physical assistance and RW. Pt completed stand-step transfer from EOB to [facility] on R with 1 HHA anterior to patient with cues for sequencing. Increased time to complete.    Ambulation/Gait                  Stairs            Wheelchair Mobility     Tilt Bed    Modified Rankin (Stroke Patients Only)       Balance Overall balance assessment: Needs assistance Sitting-balance support: Bilateral upper  extremity supported, Feet supported Sitting balance-Leahy Scale: Poor Sitting balance - Comments: pt requires BUE support to maintain upright   Standing balance support: Bilateral upper extremity supported, During functional activity, Reliant on assistive device for balance Standing balance-Leahy Scale: Poor Standing balance comment: reliant on external support                              Pertinent Vitals/Pain Pain Assessment Pain Assessment: Faces Faces Pain Scale: Hurts even more Pain Location: back and chest Pain Descriptors / Indicators: Constant, Discomfort, Grimacing, Guarding, Moaning Pain Intervention(s): Limited activity within patient's tolerance, Monitored during session    Home Living Family/patient expects to be discharged to:: Private residence Living Arrangements: Spouse/significant other Available Help at Discharge: Family;Available 24 hours/day Type of Home: House Home Access: Elevator       Home Layout: Multi-level advertising account executive) Home Equipment: Rollator (4 wheels);Rolling Walker (2 wheels);BSC/3in1;Transport chair;Hospital bed;Shower seat - built in      Prior Function Prior Level of Function : Independent/Modified Independent             Mobility Comments: mod I using rollator for mobility ADLs Comments: indep     Extremity/Trunk Assessment   Upper Extremity Assessment Upper Extremity Assessment: Generalized weakness    Lower Extremity Assessment Lower Extremity Assessment: Generalized weakness    Cervical / Trunk Assessment Cervical / Trunk Assessment: Kyphotic (scoliotic curve)  Communication   Communication Communication: Impaired Factors Affecting Communication: Reduced clarity of speech    Cognition Arousal: Alert Behavior During Therapy: WFL for tasks assessed/performed   PT - Cognitive impairments: No apparent impairments                         Following commands: Intact       Cueing Cueing Techniques: Verbal cues     General Comments General comments (skin integrity, edema, etc.): VSS on 2L    Exercises     Assessment/Plan    PT Assessment Patient needs continued PT services  PT Problem List Decreased strength;Decreased activity tolerance;Decreased balance;Decreased mobility;Pain;Cardiopulmonary status limiting activity       PT Treatment Interventions DME  instruction;Gait training;Functional mobility training;Therapeutic activities;Therapeutic exercise;Balance training;Patient/family education;Wheelchair mobility training;Manual techniques;Modalities    PT Goals (Current goals can be found in the Care Plan section)  Acute Rehab PT Goals Patient Stated Goal: to feel better PT Goal Formulation: With patient/family Time For Goal Achievement: 10/20/24 Potential to Achieve Goals: Fair    Frequency Min 2X/week     Co-evaluation               AM-PAC PT 6 Clicks Mobility  Outcome Measure Help needed turning from your back to your side while in a flat bed without using bedrails?: A Little Help needed moving from lying on your back to sitting on the side of a flat bed without using bedrails?: A Lot Help needed moving to and from a bed to a chair (including a wheelchair)?: A Little Help needed standing up from a chair using your arms (e.g., wheelchair or bedside chair)?: A Lot Help needed to walk in hospital room?: Total Help needed climbing 3-5 steps with a railing? : Total 6 Click Score: 12    End of Session Equipment Utilized During Treatment: Gait belt;Oxygen (2L) Activity Tolerance: Patient tolerated treatment well Patient left: in bed;with call bell/phone within reach;with bed alarm set;with family/visitor present Nurse Communication: Mobility status PT Visit Diagnosis: Unsteadiness  on feet (R26.81);Other abnormalities of gait and mobility (R26.89);Muscle weakness (generalized) (M62.81);Pain;Difficulty in walking, not elsewhere classified (R26.2)    Time: 8676-8581 PT Time Calculation (min) (ACUTE ONLY): 55 min   Charges:   PT Evaluation $PT Eval Moderate Complexity: 1 Mod PT Treatments $Therapeutic Activity: 8-22 mins PT General Charges $$ ACUTE PT VISIT: 1 Visit         Leontine Hilt DPT Acute Rehab Services [phone] Prefer contact via chat   Leontine NOVAK Luwanda Starr 10/06/2024, 2:32 PM

## 2024-10-06 NOTE — Plan of Care (Signed)

## 2024-10-06 NOTE — Progress Notes (Signed)
 "   Progress Note  Patient Name: Caitlin [name] Date of Encounter: 10/06/2024  Primary Cardiologist:   None   Subjective   Sleepy today.  Family at bedside.  Denies any CP or SOB.    Tele personally reviewed and demonstrates NSR.  NO further PAF or SVT on IV Amio  Inpatient Medications    Scheduled Meds:  apixaban   5 mg Oral BID   budesonide  (PULMICORT ) nebulizer solution  0.25 mg Nebulization Daily   doxycycline   100 mg Oral Q12H   levothyroxine   100 mcg Oral Q0600   Continuous Infusions:  amiodarone  30 mg/hr (10/06/24 0644)   piperacillin -tazobactam (ZOSYN )  IV 12.5 mL/hr at 10/06/24 0644   PRN Meds: acetaminophen  **OR** acetaminophen , albuterol , docusate sodium , ketorolac , polyethylene glycol   Vital Signs    Vitals:   10/06/24 0100 10/06/24 0500 10/06/24 0827 10/06/24 0831  BP: (!) 83/67 109/76 96/65   Pulse: (!) 110 (!) 110 99 97  Resp: (!) 24  17 18   Temp: 99.3 F (37.4 C) 98.5 F (36.9 C) 98.6 F (37 C)   TempSrc: Axillary Oral Oral   SpO2: 99% 97% 96% 95%  Weight:      Height:        Intake/Output Summary (Last 24 hours) at 10/06/2024 0845 Last data filed at 10/06/2024 9355 Gross per 24 hour  Intake 994.95 ml  Output 500 ml  Net 494.95 ml   Filed Weights   10/04/24 0810  Weight: 60.3 kg    Telemetry    NSR- Personally Reviewed  ECG    NA - Personally Reviewed  Physical Exam   GEN: Well nourished, well developed in no acute distress HEENT: Normal NECK: No JVD; No carotid bruits LYMPHATICS: No lymphadenopathy CARDIAC:RRR, no murmurs, rubs, gallops RESPIRATORY:  decreased BS at bases with few crackles ABDOMEN: Soft, non-tender, non-distended MUSCULOSKELETAL:  No edema; No deformity  SKIN: Warm and dry NEUROLOGIC:  Alert and oriented x 3 PSYCHIATRIC:  Normal affect   Labs    Chemistry Recent Labs  Lab 10/02/24 0656 10/04/24 0902 10/04/24 0912 10/04/24 0914 10/05/24 0200 10/05/24 0500 10/05/24 1945 10/06/24 0614  NA 137 139  137   < > 137 139  --  142  K 4.0 4.9 4.7   < > 4.0 4.4  --  4.0  CL 97* 100 100  --   --  103  --  100  CO2 25 26  --   --   --  27  --  30  GLUCOSE 106* 106* 106*  --   --  86  --  80  BUN 26* 32* 39*  --   --  28*  --  38*  CREATININE 0.66 0.64 0.80  --   --  0.51  --  0.87  CALCIUM 9.1 9.1  --   --   --  7.7*  --  8.3*  PROT 6.4* 6.5  --   --   --   --  5.7*  --   ALBUMIN 3.1* 3.2*  --   --   --   --   --   --   AST 50* 44*  --   --   --   --   --   --   ALT 10 11  --   --   --   --   --   --   ALKPHOS 120 116  --   --   --   --   --   --  BILITOT 0.3 <0.2  --   --   --   --   --   --   GFRNONAA >60 >60  --   --   --  >60  --  >60  ANIONGAP 14 13  --   --   --  10  --  12   < > = values in this interval not displayed.     Hematology Recent Labs  Lab 10/04/24 0902 10/04/24 0912 10/05/24 0200 10/05/24 0500 10/06/24 0614  WBC 18.2*  --   --  15.2* 14.7*  RBC 3.24*  --   --  2.86* 3.08*  HGB 10.3*   < > 9.9* 9.1* 9.8*  HCT 33.0*   < > 29.0* 29.7* 30.5*  MCV 101.9*  --   --  103.8* 99.0  MCH 31.8  --   --  31.8 31.8  MCHC 31.2  --   --  30.6 32.1  RDW 16.7*  --   --  16.9* 17.2*  PLT 805*  --   --  697* 710*   < > = values in this interval not displayed.    Cardiac EnzymesNo results for input(s): TROPONINI in the last 168 hours. No results for input(s): TROPIPOC in the last 168 hours.   BNPNo results for input(s): BNP, PROBNP in the last 168 hours.   DDimer No results for input(s): DDIMER in the last 168 hours.   Radiology    DG CHEST PORT 1 VIEW Result Date: 10/05/2024 CLINICAL DATA:  Status post right thoracentesis EXAM: PORTABLE CHEST 1 VIEW COMPARISON:  Same day FINDINGS: Right pleural effusion is significantly smaller. No definite evidence of thoracentesis. IMPRESSION: Right pleural effusion is significantly smaller. No definite evidence of thoracentesis. Electronically Signed   By: Lynwood Landy Raddle M.D.   On: 10/05/2024 17:49   DG CHEST PORT 1  VIEW Result Date: 10/05/2024 CLINICAL DATA:  Hypoxia EXAM: PORTABLE CHEST 1 VIEW COMPARISON:  Yesterday FINDINGS: Stable cardiomediastinal silhouette. Stable left-sided chest tube. Stable probable loculated bilateral pleural effusions. Increased bilateral lung opacities are noted concerning for edema or possibly inflammation. Moderate dextroscoliosis of thoracic spine is noted. IMPRESSION: Stable left-sided chest tube. Stable loculated bilateral pleural effusions. Increased bilateral lung opacities are noted concerning for edema or possibly inflammation. Electronically Signed   By: Lynwood Landy Raddle M.D.   On: 10/05/2024 15:34   ECHOCARDIOGRAM COMPLETE Result Date: 10/04/2024    ECHOCARDIOGRAM REPORT   Patient Name:   Caitlin [name] Date of Exam: 10/04/2024 Medical Rec #:  [id number]        Height:       63.0 in Accession #:    [id number]       Weight:       133.0 lb Date of Birth:  05/20/1956       BSA:          1.626 m Patient Age:    69 years         BP:           133/81 mmHg Patient Gender: F                HR:           87 bpm. Exam Location:  Inpatient Procedure: 2D Echo, Cardiac Doppler and Color Doppler (Both Spectral and Color            Flow Doppler were utilized during procedure). Indications:    SVT (Supraventricular Tachycardia)  History:  Patient has prior history of Echocardiogram examinations, most                 recent 08/26/2024. Arrythmias:Atrial Fibrillation. Malignant                 neoplasm of upper-outer quadrant of right breast in female,                 estrogen receptor positive (HCC). S/P mastectomy, right. Hx of                 Pulmonary embolism (HCC). Port-A-Cath in place. Chronic                 anticoagulation.  Sonographer:    Aida Pizza RCS Referring Phys: [id number] GEORGANNA ARCHER IMPRESSIONS  1. Left ventricular ejection fraction, by estimation, is 60 to 65%. The left ventricle has normal function. The left ventricle has no regional wall motion abnormalities. There is  mild concentric left ventricular hypertrophy. Left ventricular diastolic parameters were normal.  2. Right ventricular systolic function is normal. The right ventricular size is normal. There is normal pulmonary artery systolic pressure.  3. A small pericardial effusion is present. The pericardial effusion is circumferential. There is no evidence of cardiac tamponade. Large pleural effusion.  4. The mitral valve is normal in structure. Trivial mitral valve regurgitation. No evidence of mitral stenosis.  5. The aortic valve is tricuspid. Aortic valve regurgitation is mild. No aortic stenosis is present.  6. The inferior vena cava is normal in size with greater than 50% respiratory variability, suggesting right atrial pressure of 3 mmHg. Comparison(s): Changes from prior study are noted. Small circumferential pericardial effusion now present. FINDINGS  Left Ventricle: Left ventricular ejection fraction, by estimation, is 60 to 65%. The left ventricle has normal function. The left ventricle has no regional wall motion abnormalities. The left ventricular internal cavity size was normal in size. There is  mild concentric left ventricular hypertrophy. Left ventricular diastolic parameters were normal. Right Ventricle: The right ventricular size is normal. No increase in right ventricular wall thickness. Right ventricular systolic function is normal. There is normal pulmonary artery systolic pressure. The tricuspid regurgitant velocity is 1.92 m/s, and  with an assumed right atrial pressure of 3 mmHg, the estimated right ventricular systolic pressure is 17.7 mmHg. Left Atrium: Left atrial size was normal in size. Right Atrium: Right atrial size was normal in size. Pericardium: A small pericardial effusion is present. The pericardial effusion is circumferential. There is no evidence of cardiac tamponade. Mitral Valve: The mitral valve is normal in structure. Trivial mitral valve regurgitation. No evidence of mitral valve  stenosis. Tricuspid Valve: The tricuspid valve is normal in structure. Tricuspid valve regurgitation is mild . No evidence of tricuspid stenosis. Aortic Valve: The aortic valve is tricuspid. Aortic valve regurgitation is mild. No aortic stenosis is present. Aortic valve mean gradient measures 4.0 mmHg. Aortic valve peak gradient measures 9.2 mmHg. Aortic valve area, by VTI measures 2.35 cm. Pulmonic Valve: The pulmonic valve was normal in structure. Pulmonic valve regurgitation is not visualized. No evidence of pulmonic stenosis. Aorta: The aortic root and ascending aorta are structurally normal, with no evidence of dilitation. Venous: The inferior vena cava is normal in size with greater than 50% respiratory variability, suggesting right atrial pressure of 3 mmHg. IAS/Shunts: No atrial level shunt detected by color flow Doppler. Additional Comments: There is a large pleural effusion.  LEFT VENTRICLE PLAX 2D LVIDd:  4.00 cm   Diastology LVIDs:         2.70 cm   LV e' medial:    8.81 cm/s LV PW:         1.20 cm   LV E/e' medial:  12.9 LV IVS:        1.10 cm   LV e' lateral:   13.20 cm/s LVOT diam:     1.90 cm   LV E/e' lateral: 8.6 LV SV:         75 LV SV Index:   46 LVOT Area:     2.84 cm  RIGHT VENTRICLE RV S prime:     13.40 cm/s TAPSE (M-mode): 1.9 cm LEFT ATRIUM             Index        RIGHT ATRIUM           Index LA diam:        2.40 cm 1.48 cm/m   RA Area:     13.70 cm LA Vol (A2C):   39.4 ml 24.23 ml/m  RA Volume:   35.70 ml  21.96 ml/m LA Vol (A4C):   36.7 ml 22.57 ml/m LA Biplane Vol: 38.1 ml 23.43 ml/m  AORTIC VALVE AV Area (Vmax):    2.40 cm AV Area (Vmean):   2.36 cm AV Area (VTI):     2.35 cm AV Vmax:           152.00 cm/s AV Vmean:          91.700 cm/s AV VTI:            0.318 m AV Peak Grad:      9.2 mmHg AV Mean Grad:      4.0 mmHg LVOT Vmax:         128.50 cm/s LVOT Vmean:        76.200 cm/s LVOT VTI:          0.264 m LVOT/AV VTI ratio: 0.83  AORTA Ao Root diam: 3.40 cm MITRAL  VALVE                TRICUSPID VALVE MV Area (PHT): 3.21 cm     TR Peak grad:   14.7 mmHg MV Decel Time: 236 msec     TR Vmax:        192.00 cm/s MV E velocity: 114.00 cm/s MV A velocity: 82.30 cm/s   SHUNTS MV E/A ratio:  1.39         Systemic VTI:  0.26 m                             Systemic Diam: 1.90 cm Georganna Archer Electronically signed by Georganna Archer Signature Date/Time: 10/04/2024/5:35:59 PM    Final    DG Chest Port 1 View Result Date: 10/04/2024 EXAM: 1 VIEW(S) XRAY OF THE CHEST 10/04/2024 08:56:00 AM COMPARISON: 10/02/2024 CLINICAL HISTORY: dyspnea FINDINGS: LINES, TUBES AND DEVICES: Left PleurX catheter stable in position. LUNGS AND PLEURA: Stable large loculated left pleural effusion. Increased moderate right pleural effusion. Increased diffuse interstitial opacities and patchy opacities throughout left lung and within right lower lung. No pneumothorax. HEART AND MEDIASTINUM: No acute abnormality of the cardiac and mediastinal silhouettes. BONES AND SOFT TISSUES: Stable dextrocurvature of thoracic spine. No acute osseous abnormality. IMPRESSION: 1. Increased diffuse interstitial and patchy opacities throughout the left lung and within the right lower lung. 2. Stable large loculated left pleural  effusion and increased moderate right pleural effusion. Electronically signed by: Lonni Necessary MD 10/04/2024 10:00 AM EST RP Workstation: [id number]    Cardiac Studies   Echo:  See above.   Patient Profile     69 y.o. 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 Eben.   Her husband is Dr. Deward Soja a long time primary care MD in [place].    Assessment & Plan    SVT:   This has been recurrent over the last several weeks. Options are limited with her underling illness and hypotension.   -maintaining NSR on IV Amio -will stop IV Amio and start Amio 400mg  BID load x 1 week then 400mg  daily x 1 week then 200mg  daily  PAF:   -no further afib -transitioning IV Amio to PO -continue Eliquis  5mg  BID  EDEMA:  -Edema is predominantly lymphedema.     -I&O's incomplete  PERICARDIA EFFUSION:  This is small and not clinically relevant.    METASTATIC BREAST CANCER RIGHT MALIGNANT PLEURAL EFFUSION: -had thoracentesis on right side a few days ago>>reaccumulated -s/p thoracentesis yesterday of 1.3L of dark amber fluid   PULMONARY EMBOLISM -s/p recent thrombectomy -on Eliquis  5mg  BID  For questions or updates, please contact [facility] Please consult [email or web address] for contact info under Cardiology/STEMI.   Signed, Wilbert Bihari, MD  10/06/2024, 8:45 AM    "

## 2024-10-06 NOTE — Progress Notes (Addendum)
 "   HD#1 Subjective:   Summary: Caitlin [name] is a 69 y.o. 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  Patient appears more alert awake and answering questions appropriately.  Denies any current chest pain, shortness of breath or pain.  She is currently on 2 L on nasal cannula, breathing comfortably, without appearing in acute distress.  [name] shares that that they will be having a meeting with palliative care this morning, with plans for home hospice.  Objective:  Vital signs in last 24 hours: Vitals:   10/05/24 2000 10/05/24 2235 10/06/24 0100 10/06/24 0500  BP:  (!) 85/63 (!) 83/67 109/76  Pulse: (!) 109 (!) 102 (!) 110 (!) 110  Resp:  (!) 24 (!) 24   Temp: 99 F (37.2 C)  99.3 F (37.4 C) 98.5 F (36.9 C)  TempSrc:   Axillary Oral  SpO2: 99% 100% 99% 97%  Weight:      Height:       Supplemental O2: Nasal Cannula SpO2: 97 % FiO2 (%): 40 %   Physical Exam:   Constitutional: Appears ill, laying in bed, no apparent distress HENT: normocephalic atraumatic, mucous membranes moist Eyes: conjunctiva non-erythematous Cardiovascular: Regular rate Pulmonary/Chest: Breathing comfortably Abdominal: Soft, nontender MSK: Warm to touch, no lower extremity edema bilaterally Neurological: Awake, alert, following commands.  Answering questions appropriately. Skin: warm and dry Psych: Pleasant  Filed Weights   10/04/24 0810  Weight: 60.3 kg     Intake/Output Summary (Last 24 hours) at 10/06/2024 9287 Last data filed at 10/06/2024 0644 Gross per 24 hour  Intake 994.95 ml  Output  500 ml  Net 494.95 ml   Net IO Since Admission: 743.48 mL [10/06/24 0712]  Pertinent Labs:    Latest Ref Rng & Units 10/06/2024    6:14 AM 10/05/2024    5:00 AM 10/05/2024    2:00 AM  CBC  WBC 4.0 - 10.5 K/uL 14.7  15.2    Hemoglobin 12.0 - 15.0 g/dL 9.8  9.1  9.9   Hematocrit 36.0 - 46.0 % 30.5  29.7  29.0   Platelets 150 - 400 K/uL 710  697         Latest Ref Rng & Units 10/06/2024    6:14 AM 10/05/2024    7:45 PM 10/05/2024    5:00 AM  CMP  Glucose 70 - 99 mg/dL 80   86   BUN 8 - 23 mg/dL 38   28   Creatinine 9.55 - 1.00 mg/dL 9.12   9.48   Sodium 864 - 145 mmol/L 142   139   Potassium 3.5 - 5.1 mmol/L 4.0   4.4   Chloride 98 - 111 mmol/L 100   103   CO2 22 - 32 mmol/L 30   27   Calcium 8.9 - 10.3 mg/dL 8.3   7.7   Total Protein 6.5 - 8.1 g/dL  5.7      Imaging: DG CHEST PORT 1 VIEW Result Date: 10/05/2024 CLINICAL DATA:  Status post right thoracentesis EXAM: PORTABLE CHEST 1 VIEW COMPARISON:  Same day FINDINGS: Right pleural effusion is significantly smaller. No definite evidence of thoracentesis. IMPRESSION: Right pleural effusion is  significantly smaller. No definite evidence of thoracentesis. Electronically Signed   By: Lynwood Landy Raddle M.D.   On: 10/05/2024 17:49   DG CHEST PORT 1 VIEW Result Date: 10/05/2024 CLINICAL DATA:  Hypoxia EXAM: PORTABLE CHEST 1 VIEW COMPARISON:  Yesterday FINDINGS: Stable cardiomediastinal silhouette. Stable left-sided chest tube. Stable probable loculated bilateral pleural effusions. Increased bilateral lung opacities are noted concerning for edema or possibly inflammation. Moderate dextroscoliosis of thoracic spine is noted. IMPRESSION: Stable left-sided chest tube. Stable loculated bilateral pleural effusions. Increased bilateral lung opacities are noted concerning for edema or possibly inflammation. Electronically Signed   By: Lynwood Landy Raddle M.D.   On: 10/05/2024 15:34    Assessment/Plan:   Principal Problem:   Paroxysmal supraventricular  tachycardia Active Problems:   Chronic anticoagulation   Community acquired pneumonia   Metastatic malignant neoplasm (HCC)   Malignant pleural effusion (HCC)   Acute respiratory failure with hypoxia and hypercapnia (HCC)   Patient Summary: Caitlin [name] is a 69 y.o. 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 S/p multiple lumpectomies and partial right mastectomy. Received chemo, radiation, immunotherapy. Suffers unfortunately from malignant pleural effusions. Missed palliative chemo due to the above problems.   -- Appreciate palliative care assistance per meeting today  -- NO BIPAP if respiratory decline > transition to comfort focus   -- [facility] home [facility] > need to verify if they will enroll with palliative chemo    -- If yes then will need DME hospital bed, wheelchair, and BSC.     -- PT/OT orders for DME needs   -- Unclear about pleurx cath  - Tylenol  500 mg every 4 as needed for pain -- Toradol  15 mg every 6 hours as needed for pain control  Sepsis  lactic acidosis  respiratory acidosis w/ metabolic alkalosis compensation  leukocytosis Bilateral lobe pneumonia L > R  Recurrent bilateral malignant pleural effusions status post L PleurX catheter 11/25 Acute on Chronic hypoxic respiratory failure on 2 to 3 L oxygen at baseline S/p thoracentesis R pleural effusion with 1.3 L dark amber fluid drained and sent for pleural studies.  R Pleural studies showed total nucleated cell count 1538 (5% neutrophilic count) > cultures without any growth 1/2 ABG > pH 7.14, pCO2 93, pO2 113, bicarb 31.7; patient was placed on BiPAP, repeat  ABG showed pH 7.4, pCO2 49, pO2 129, bicarb 34.1 Lactic acid 2.2 > 2.1 On evaluation this morning, on 2 L, breathing comfortably, no apparent respiratory distress -Follow-up pleural studies, calculate SAAG -Continue doxycycline  100 mg twice daily for 5 days and Zosyn  with plans to transition to PO   -Albuterol  as needed -Pulmicort  daily -IR order for right PleurX catheter placed > family having ongoing conversation whether they will proceed.  -Appreciate PCCM recommendations   PAF, now NSR  SVT Recent PE s/p thrombectomy on Eliquis  Rates 90s-110s, NSR, on Amio PO taper dose Continue Eliquis  5 mg twice daily Magnesium 2.2, potassium 4  Right upper extremity edema 2/2 radiation/mastectomy/lumpectomy  Present, mainly lymphedema  Chronic constipation Reports last bowel movement 5 days ago. Placed on Senokot twice daily, MiraLAX  daily  Normocytic anemia/anemia of chronic disease Thrombocytosis Hemoglobin 9.8, appears stable from baseline.  No acute  signs or symptoms of bleed.   Hypothyroidism - Continue Synthroid  100 mcg daily   Diet: Normal IVF: None  VTE: Eliquis  Wounds: Assessed by RN  Code: DNR/DNI PT/OT recs: Palliative care meeting Family Update: Husband and sister at bedside   Dispo: Anticipated discharge to Home in 2 days pending adequate management.   Signature: Toma Edwards, D.O.  Internal Medicine Resident, PGY-2 [facility]  7:12 AM, 10/06/2024   Please contact the on call pager after 5 pm and on weekends at [phone].  "

## 2024-10-06 NOTE — Progress Notes (Signed)
 "                                                                                                                                                                                                         Daily Progress Note   Patient Name: Caitlin [name]       Date: 10/06/2024 DOB: 03-09-1956  Age: 69 y.o. MRN#: [id number] Attending Physician: Shawn Sick, MD Primary Care Physician: Toribio Jerel MATSU, MD Admit Date: 10/04/2024  Reason for Consultation/Follow-up: Establishing goals of care  Objective: I have reviewed medical records including: EPIC notes: Nursing, CCM, RT, cardiology.  Overnight events noted of worsening respiratory status requiring BiPAP, IV Lasix , and Amio bolus. MAR: As needed medications administered in the last 24 hours-Toradol  x 2, Tylenol  x 1 Labs personally reviewed: Critically high lactic acid 2.1, elevated WBC 14.7 and trending down assessed for overall health, and disease severity, helping predict prognosis and guide care Vital signs: Hypotensive and tachycardic overnight  Subjective: Received report from primary RN -no acute concerns.  Per RN, patient is back on nasal cannula and tolerating well.  Went to visit patient at bedside for scheduled meeting -she is lying in bed awake, alert but sleepy, oriented, and able to participate in conversation. No signs or non-verbal gestures of pain or discomfort noted. No respiratory distress, increased work of breathing, or secretions noted.  She is on 2 L O2 nasal cannula.  Congested cough noted.  Patient appears to be breathing easier compared to yesterday.  Patient denies pain or shortness of breath at this time.  --------------------------------------------------------------------------------------------  Advance Care Planning Conversation   Pertinent diagnoses: Metastatic breast cancer status post partial mastectomy on palliative chemotherapy, left lower lobe pneumonia, sepsis    The patient and/or family consented to a  voluntary Advance Care Planning conversation in person. Individuals present for the conversation: Patient, husband/[name], sister/Rona  Summary of the conversation:  Emotional support provided to patient and family.  Therapeutic listening provided as they reflect on the events of last night.  [name] husband, who is a longtime family practice physician, has a clear understanding of her current acute medical situation.  Patient and family are interested in discussing her discharge with hospice care.  Provided education and counseling at length on the philosophy and benefits of hospice care. Discussed that it offers a holistic approach to care in the setting of end-stage illness, and is about supporting the patient where they are allowing nature to take it's course. Discussed the hospice team includes RNs, physicians, social workers, and chaplains. They can provide personal  care, support for the family, and help keep patient out of the hospital as well as assist with DME needs for home hospice. Education provided on the difference between home vs residential hospice.  We discussed in detail support for husband if patient were to return home with hospice to include friends/family, private duty caregivers, and/or hospice respite care. We discussed which of these options would provided the patient with the most dignity at end of life. Husband is interested in getting a list of private duty caregivers.   Patient would like to continue palliative chemotherapy as this does help with her swelling and pain.  Discussed that sometimes hospice organizations will enroll patients receiving palliative treatment for symptom management; however, this is an individual case-by-case basis.  Patient indicates that she does not want to be a burden.  Allowed space and time for patient and family to discuss information and the decision was made for her discharge home with hospice if they can continue palliative chemotherapy.   They are most interested in working with [facility]. DME discussed -they would like a hospital bed, wheelchair, bedside commode delivered prior to patient's discharge. We discussed if PT/OT assistance for family education would be beneficial for guidance on how to best care for patient at home - family would find this very helpful and are appreciative.   Discussed in detail goals while patient is hospitalized to include continuing to medically optimize versus transition to full comfort care.  Goal is to medically optimize; however, in the instance of another respiratory decline, patient does not want to go back on BiPAP - transition to full comfort.   Discussed the possibility of right-sided plurex drain placement to assist with fluid management/decrease need for thoracentesis.  Discussed this possibility at length in context of patient's goals, risk for PE stopping anticoagulation for procedure, and cancer location/drain location.  Patient and family are open to IR evaluation and will make stepwise decisions pending their insight.  Patient and family expressed appreciation for PMT assistance today.  All questions and concerns addressed. Encouraged to call with questions and/or concerns. PMT card provided.  11:15 AM Received notification from RN that after discussions with pulmonology, husband has indicated they decided to move forward with PleurX catheter placement on Monday and anticipated discharge date is Tuesday 1/6.   Outcome of the conversation: - Medically optimize - No BiPAP.  In the instance of respiratory decline, transition to full comfort -Discharge home with hospice after DME delivery -Proceed with PleurX catheter placement on Monday -PT/OT consult for family guidance  -[facility] consult for private duty caregiver list for husband  I spent 50 minutes providing separately identifiable ACP services with the patient and/or surrogate decision maker in a voluntary, in-person conversation  discussing the patient's wishes and goals as detailed in the above note.   Length of Stay: 1  Current Medications: Scheduled Meds:   apixaban   5 mg Oral BID   budesonide  (PULMICORT ) nebulizer solution  0.25 mg Nebulization Daily   doxycycline   100 mg Oral Q12H   levothyroxine   100 mcg Oral Q0600    Continuous Infusions:  amiodarone  30 mg/hr (10/06/24 0644)   piperacillin -tazobactam (ZOSYN )  IV 12.5 mL/hr at 10/06/24 0644    PRN Meds: acetaminophen  **OR** acetaminophen , albuterol , docusate sodium , ketorolac , polyethylene glycol  Physical Exam          Vital Signs: BP 96/65 (BP Location: Left Wrist)   Pulse 97   Temp 98.6 F (37 C) (Oral)   Resp 18  Ht 5' 3 (1.6 m)   Wt 60.3 kg   SpO2 95%   BMI 23.56 kg/m  SpO2: SpO2: 95 % O2 Device: O2 Device: Nasal Cannula O2 Flow Rate: O2 Flow Rate (L/min): 2 L/min  Intake/output summary:  Intake/Output Summary (Last 24 hours) at 10/06/2024 0839 Last data filed at 10/06/2024 9355 Gross per 24 hour  Intake 994.95 ml  Output 500 ml  Net 494.95 ml   LBM: Last BM Date :  (PTA) Baseline Weight: Weight: 60.3 kg Most recent weight: Weight: 60.3 kg       Palliative Assessment/Data:      Patient Active Problem List   Diagnosis Date Noted   Malignant pleural effusion (HCC) 10/05/2024   Acute respiratory failure with hypoxia and hypercapnia (HCC) 10/05/2024   Community acquired pneumonia 10/04/2024   Paroxysmal supraventricular tachycardia 10/04/2024   Metastatic malignant neoplasm (HCC) 10/04/2024   Pulmonary embolism (HCC) 08/25/2024   S/P mastectomy, right 05/03/2023   Port-A-Cath in place 12/16/2022   Genetic testing 10/11/2022   Appendicitis with peritonitis 09/29/2022   Family history of prostate cancer 09/29/2022   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female, estrogen receptor positive (HCC) 09/22/2022   Atrial fibrillation with RVR (HCC) 11/30/2010   Chronic anticoagulation 11/30/2010    Hypothyroidism 11/30/2010   PALPITATIONS 11/27/2010    Palliative Care Assessment & Plan   Patient Profile: 69 y.o. 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    Of note, patient has had 2 admissions and 2 ED visits in the last 6 months.  Assessment: Principal Problem:   Paroxysmal supraventricular tachycardia Active Problems:   Chronic anticoagulation   Community acquired pneumonia   Metastatic malignant neoplasm (HCC)   Malignant pleural effusion (HCC)   Acute respiratory failure with hypoxia and hypercapnia (HCC)   Concern about end of life  Recommendations/Plan: Continue to treat the treatable Continue DNR/DNI as previously documented Plan is for discharge home with hospice after medical optimization; however, will need to see if they will enroll patient while still receiving palliative chemotherapy. She wishes to continue palliative chemo for management of swelling/pain. If hospice can support this, patient will need DME delivered prior to discharge In the event of another respiratory decline, do not escalate to Bipap - transition to full comfort Proceed with PleurX catheter placement on Monday 1/5 TOC notified and consult placed for: home hospice requesting [facility], DME needs, husband's request for list of private duty caregivers  PT/OT consulted for assistance with providing family guidance on how to support patient in the home after discharge with hospice  PMT will continue to follow and support holistically  Goals of Care and Additional  Recommendations: Limitations on Scope of Treatment: No Tracheostomy  Code Status:    Code Status Orders  (From admission, onward)           Start     Ordered   10/05/24 1325  Do not attempt resuscitation (DNR)- Limited -Do Not Intubate (DNI)  (Code Status)  Continuous       Question Answer Comment  If pulseless and not breathing No CPR or chest  compressions.   In Pre-Arrest Conditions (Patient Is Breathing and Has A Pulse) Do not intubate. Provide all appropriate non-invasive medical interventions. Avoid ICU transfer unless indicated or required.   Consent: Discussion documented in EHR or advanced directives reviewed      10/05/24 1325           Code Status History     Date Active Date Inactive Code Status Order ID Comments User Context   10/04/2024 1148 10/05/2024 1325 Full Code [id number]  Harrie Bruckner, DO ED   08/25/2024 1452 08/29/2024 1829 Full Code [id number]  Catherine Cools, MD ED   05/03/2023 1120 05/04/2023 1327 Full Code [id number]  Ebbie Cough, MD Inpatient      Advance Directive Documentation    Flowsheet Row Most Recent Value  Type of Advance Directive Healthcare Power of Attorney  Pre-existing out of facility DNR order (yellow form or pink MOST form) --  MOST Form in Place? --    Prognosis:  < 6 months  Discharge Planning: Home with Hospice  Care plan was discussed with primary RN, patient, patient's family, attending team, [facility]  Thank you for allowing the Palliative Medicine Team to assist in the care of this patient.  Billing based on MDM: High  Problems Addressed: One acute or chronic illness or injury that poses a threat to life or bodily function  Amount and/or Complexity of Data: Category 1:Review of prior external note(s) from each unique source, Review of the result(s) of each unique test, and Assessment requiring an independent historian(s) and Category 3:Discussion of management or test interpretation with external  physician/other qualified health care professional/appropriate source (not separately reported)  Risks: Decision regarding hospitalization or escalation of hospital care     Gladyes Kudo CHRISTELLA Sharps, NP  Please contact Palliative Medicine Team phone at [phone] for questions and concerns.   *Portions of this note are a verbal dictation therefore any spelling and/or grammatical errors are due to the Dragon Medical One system interpretation.     "

## 2024-10-06 NOTE — TOC CM/SW Note (Addendum)
 Referral received to assist with the DC plan to return home with hospice. Pt and husband are requesting hospice with [facility]. The husband has been in contact with [facility] already. Per referral, we need to confirm they are willing to enroll her while still receiving palliative chemo. If they will enroll her while still receiving treatments, patient will need DME (hospital bed, wheelchair, and [facility]) prior to discharge. Provided sister with a list of private home health agencies per husband's request.   Contacted [facility], spoke with Megan, she asked that I fax the medical records. Medical records faxed to [phone]. Received confirmation that the fax was successful. Will continue to f/u to assist with the DC plan.

## 2024-10-07 ENCOUNTER — Inpatient Hospital Stay (HOSPITAL_COMMUNITY)

## 2024-10-07 DIAGNOSIS — J9621 Acute and chronic respiratory failure with hypoxia: Secondary | ICD-10-CM | POA: Diagnosis not present

## 2024-10-07 DIAGNOSIS — C799 Secondary malignant neoplasm of unspecified site: Secondary | ICD-10-CM | POA: Diagnosis not present

## 2024-10-07 DIAGNOSIS — J189 Pneumonia, unspecified organism: Secondary | ICD-10-CM | POA: Diagnosis not present

## 2024-10-07 DIAGNOSIS — Z515 Encounter for palliative care: Secondary | ICD-10-CM | POA: Diagnosis not present

## 2024-10-07 DIAGNOSIS — J9602 Acute respiratory failure with hypercapnia: Secondary | ICD-10-CM | POA: Diagnosis not present

## 2024-10-07 DIAGNOSIS — C50911 Malignant neoplasm of unspecified site of right female breast: Secondary | ICD-10-CM | POA: Diagnosis not present

## 2024-10-07 DIAGNOSIS — I471 Supraventricular tachycardia, unspecified: Secondary | ICD-10-CM | POA: Diagnosis not present

## 2024-10-07 DIAGNOSIS — A419 Sepsis, unspecified organism: Secondary | ICD-10-CM | POA: Diagnosis not present

## 2024-10-07 LAB — BLOOD GAS, VENOUS
Acid-Base Excess: 9 mmol/L — ABNORMAL HIGH (ref 0.0–2.0)
Bicarbonate: 33.5 mmol/L — ABNORMAL HIGH (ref 20.0–28.0)
O2 Saturation: 97.6 %
Patient temperature: 36.8
pCO2, Ven: 44 mmHg (ref 44–60)
pH, Ven: 7.49 — ABNORMAL HIGH (ref 7.25–7.43)
pO2, Ven: 72 mmHg — ABNORMAL HIGH (ref 32–45)

## 2024-10-07 LAB — BLOOD GAS, ARTERIAL
Acid-Base Excess: 9.7 mmol/L — ABNORMAL HIGH (ref 0.0–2.0)
Bicarbonate: 34.3 mmol/L — ABNORMAL HIGH (ref 20.0–28.0)
O2 Saturation: 96.8 %
Patient temperature: 36.5
pCO2 arterial: 44 mmHg (ref 32–48)
pH, Arterial: 7.5 — ABNORMAL HIGH (ref 7.35–7.45)
pO2, Arterial: 68 mmHg — ABNORMAL LOW (ref 83–108)

## 2024-10-07 LAB — BASIC METABOLIC PANEL WITH GFR
Anion gap: 10 (ref 5–15)
BUN: 30 mg/dL — ABNORMAL HIGH (ref 8–23)
CO2: 30 mmol/L (ref 22–32)
Calcium: 8.1 mg/dL — ABNORMAL LOW (ref 8.9–10.3)
Chloride: 99 mmol/L (ref 98–111)
Creatinine, Ser: 0.58 mg/dL (ref 0.44–1.00)
GFR, Estimated: >60 mL/min
Glucose, Bld: 87 mg/dL (ref 70–99)
Potassium: 3.3 mmol/L — ABNORMAL LOW (ref 3.5–5.1)
Sodium: 139 mmol/L (ref 135–145)

## 2024-10-07 LAB — CBC
HCT: 29.8 % — ABNORMAL LOW (ref 36.0–46.0)
Hemoglobin: 9.6 g/dL — ABNORMAL LOW (ref 12.0–15.0)
MCH: 31.2 pg (ref 26.0–34.0)
MCHC: 32.2 g/dL (ref 30.0–36.0)
MCV: 96.8 fL (ref 80.0–100.0)
Platelets: 670 K/uL — ABNORMAL HIGH (ref 150–400)
RBC: 3.08 MIL/uL — ABNORMAL LOW (ref 3.87–5.11)
RDW: 17.5 % — ABNORMAL HIGH (ref 11.5–15.5)
WBC: 18.4 K/uL — ABNORMAL HIGH (ref 4.0–10.5)
nRBC: 0 % (ref 0.0–0.2)

## 2024-10-07 LAB — PROTEIN, BODY FLUID (OTHER): Total Protein, Body Fluid Other: 3.5 g/dL

## 2024-10-07 LAB — MAGNESIUM: Magnesium: 2.1 mg/dL (ref 1.7–2.4)

## 2024-10-07 LAB — LD, BODY FLUID (OTHER): LD, Body Fluid: 788 IU/L

## 2024-10-07 MED ORDER — POTASSIUM CHLORIDE 20 MEQ PO PACK
40.0000 meq | PACK | Freq: Once | ORAL | Status: AC
  Administered 2024-10-07: 40 meq via ORAL
  Filled 2024-10-07: qty 2

## 2024-10-07 MED ORDER — FUROSEMIDE 10 MG/ML IJ SOLN
40.0000 mg | Freq: Once | INTRAMUSCULAR | Status: AC
  Administered 2024-10-07: 40 mg via INTRAVENOUS
  Filled 2024-10-07: qty 4

## 2024-10-07 MED ORDER — POTASSIUM CHLORIDE CRYS ER 20 MEQ PO TBCR: 40.0000 meq | EXTENDED_RELEASE_TABLET | Freq: Once | ORAL | Status: DC

## 2024-10-07 MED ORDER — ENSURE PLUS HIGH PROTEIN PO LIQD
237.0000 mL | Freq: Two times a day (BID) | ORAL | Status: DC
  Administered 2024-10-08 – 2024-10-09 (×2): 237 mL via ORAL

## 2024-10-07 MED ORDER — POTASSIUM CHLORIDE CRYS ER 20 MEQ PO TBCR
40.0000 meq | EXTENDED_RELEASE_TABLET | Freq: Once | ORAL | Status: AC
  Administered 2024-10-07: 40 meq via ORAL
  Filled 2024-10-07: qty 2

## 2024-10-07 NOTE — Progress Notes (Signed)
"   Progress Note  Patient Name: Caitlin [name] Date of Encounter: 10/07/2024  Primary Cardiologist:   None   Subjective   Sleepy today.  Family at bedside.    Transitioned from IV Amio to p.o. yesterday   NO further SVT on tele  Inpatient Medications    Scheduled Meds:  [START ON 10/27/2024] amiodarone   200 mg Oral Daily   amiodarone   400 mg Oral BID   [START ON 10/31/2024] amiodarone   400 mg Oral Daily   budesonide  (PULMICORT ) nebulizer solution  0.25 mg Nebulization Daily   doxycycline   100 mg Oral Q12H   enoxaparin  (LOVENOX ) injection  1 mg/kg Subcutaneous Q12H   levothyroxine   100 mcg Oral Q0600   polyethylene glycol  17 g Oral Daily   potassium chloride   40 mEq Oral Once   potassium chloride   40 mEq Oral Once   senna-docusate  1 tablet Oral BID   Continuous Infusions:  piperacillin -tazobactam (ZOSYN )  IV 3.375 g (10/07/24 0436)   PRN Meds: acetaminophen  **OR** acetaminophen , albuterol , ketorolac , oxyCODONE , oxyCODONE    Vital Signs    Vitals:   10/06/24 2119 10/06/24 2353 10/07/24 0411 10/07/24 0755  BP: 101/64 107/71 98/64   Pulse: (!) 101  92 (!) 103  Resp: 16 16 16 17   Temp: 98.7 F (37.1 C) 98.8 F (37.1 C) 99.1 F (37.3 C)   TempSrc: Oral Oral Oral   SpO2: 94% 94% 93% 93%  Weight:      Height:        Intake/Output Summary (Last 24 hours) at 10/07/2024 0820 Last data filed at 10/07/2024 0600 Gross per 24 hour  Intake 684.26 ml  Output 300 ml  Net 384.26 ml   Filed Weights   10/04/24 0810  Weight: 60.3 kg    Telemetry    Normal sinus rhythm- Personally Reviewed  ECG    NA - Personally Reviewed  Physical Exam   GEN: Well nourished, well developed in no acute distress HEENT: Normal NECK: No JVD; No carotid bruits LYMPHATICS: No lymphadenopathy CARDIAC:RRR, no murmurs, rubs, gallops RESPIRATORY: Crackles at bases and anteriorly ABDOMEN: Soft, non-tender, non-distended MUSCULOSKELETAL:  No edema; No deformity  SKIN: Warm and  dry NEUROLOGIC:  Alert and oriented x 3 PSYCHIATRIC:  Normal affect    Labs    Chemistry Recent Labs  Lab 10/02/24 0656 10/04/24 0902 10/04/24 0912 10/05/24 0500 10/05/24 1945 10/06/24 0614 10/07/24 0400  NA 137 139   < > 139  --  142 139  K 4.0 4.9   < > 4.4  --  4.0 3.3*  CL 97* 100   < > 103  --  100 99  CO2 25 26  --  27  --  30 30  GLUCOSE 106* 106*   < > 86  --  80 87  BUN 26* 32*   < > 28*  --  38* 30*  CREATININE 0.66 0.64   < > 0.51  --  0.87 0.58  CALCIUM 9.1 9.1  --  7.7*  --  8.3* 8.1*  PROT 6.4* 6.5  --   --  5.7*  --   --   ALBUMIN 3.1* 3.2*  --   --   --   --   --   AST 50* 44*  --   --   --   --   --   ALT 10 11  --   --   --   --   --   [facility]  120 116  --   --   --   --   --   BILITOT 0.3 <0.2  --   --   --   --   --   GFRNONAA >60 >60  --  >60  --  >60 >60  ANIONGAP 14 13  --  10  --  12 10   < > = values in this interval not displayed.     Hematology Recent Labs  Lab 10/05/24 0500 10/06/24 0614 10/07/24 0400  WBC 15.2* 14.7* 18.4*  RBC 2.86* 3.08* 3.08*  HGB 9.1* 9.8* 9.6*  HCT 29.7* 30.5* 29.8*  MCV 103.8* 99.0 96.8  MCH 31.8 31.8 31.2  MCHC 30.6 32.1 32.2  RDW 16.9* 17.2* 17.5*  PLT 697* 710* 670*    Cardiac EnzymesNo results for input(s): TROPONINI in the last 168 hours. No results for input(s): TROPIPOC in the last 168 hours.   BNPNo results for input(s): BNP, PROBNP in the last 168 hours.   DDimer No results for input(s): DDIMER in the last 168 hours.   Radiology    DG CHEST PORT 1 VIEW Result Date: 10/06/2024 CLINICAL DATA:  Dyspnea EXAM: PORTABLE CHEST 1 VIEW COMPARISON:  Yesterday FINDINGS: Stable cardiomediastinal silhouette. Stable left-sided chest tube without pneumothorax. Stable bilateral loculated pleural effusions. Stable bibasilar opacities concerning for edema or possibly atelectasis. IMPRESSION: Stable left-sided chest tube. Stable bilateral loculated pleural effusions. Stable bibasilar opacities as described  above. Electronically Signed   By: Lynwood Landy Raddle M.D.   On: 10/06/2024 17:40   DG CHEST PORT 1 VIEW Result Date: 10/05/2024 CLINICAL DATA:  Status post right thoracentesis EXAM: PORTABLE CHEST 1 VIEW COMPARISON:  Same day FINDINGS: Right pleural effusion is significantly smaller. No definite evidence of thoracentesis. IMPRESSION: Right pleural effusion is significantly smaller. No definite evidence of thoracentesis. Electronically Signed   By: Lynwood Landy Raddle M.D.   On: 10/05/2024 17:49   DG CHEST PORT 1 VIEW Result Date: 10/05/2024 CLINICAL DATA:  Hypoxia EXAM: PORTABLE CHEST 1 VIEW COMPARISON:  Yesterday FINDINGS: Stable cardiomediastinal silhouette. Stable left-sided chest tube. Stable probable loculated bilateral pleural effusions. Increased bilateral lung opacities are noted concerning for edema or possibly inflammation. Moderate dextroscoliosis of thoracic spine is noted. IMPRESSION: Stable left-sided chest tube. Stable loculated bilateral pleural effusions. Increased bilateral lung opacities are noted concerning for edema or possibly inflammation. Electronically Signed   By: Lynwood Landy Raddle M.D.   On: 10/05/2024 15:34    Cardiac Studies   Echo:  See above.   Patient Profile     69 y.o. 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 Eben.   Her husband is Dr. Deward Soja a long time primary care MD in [place].   Assessment & Plan    SVT:   This has been recurrent over the last several weeks. Options are limited with her underling illness and hypotension.   -Remains in normal sinus rhythm on telemetry -Continue Amio 400 mg twice daily x 1 week  then 400 mg daily x 1 week then 200 mg daily  PAF:   - No further A-fib on telemetry - Now on p.o. Amio load as above - Continue  Eliquis  5 mg twice daily  EDEMA:  -Edema is predominantly lymphedema.     -I&O's incomplete -Chest x-ray yesterday showed stable bilateral lobulated pleural effusions and stable bibasilar opacities possibly edema - I am going to give her a dose of Lasix  40 mg IV today and reassess in the a.m.  PERICARDIA EFFUSION:  This is small and not clinically relevant.    METASTATIC BREAST CANCER RIGHT MALIGNANT PLEURAL EFFUSION: -had thoracentesis on right side a few days ago>>reaccumulated -s/p thoracentesis 1 to of 1.3L of dark amber fluid  -Patient is DNR limited -Patient is planning on going to home in the hospice on discharge  PULMONARY EMBOLISM -s/p recent thrombectomy -Continue Eliquis  5 mg twice daily  For questions or updates, please contact [facility] Please consult [email or web address] for contact info under Cardiology/STEMI.   Signed, Wilbert Bihari, MD  10/07/2024, 8:20 AM    "

## 2024-10-07 NOTE — Plan of Care (Signed)

## 2024-10-07 NOTE — Progress Notes (Signed)
 "   HD#2 Subjective:   Summary: Caitlin [name] is a 69 y.o. 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 Denied any chest pain or shortness of breath.  Husband at bedside reports that she appears in minimal respiratory distress. We did have a conversation about BiPAP, patient said that she does not want to wear BiPAP in the event if she becomes hypoxic and failed HFNC.   Objective:  Vital signs in last 24 hours: Vitals:   10/06/24 2353 10/07/24 0411 10/07/24 0755 10/07/24 0842  BP: 107/71 98/64  (P) 111/72  Pulse:  92 (!) 103 (P) 80  Resp: 16 16 17  (P) 17  Temp: 98.8 F (37.1 C) 99.1 F (37.3 C)  (P) 98.3 F (36.8 C)  TempSrc: Oral Oral  (P) Oral  SpO2: 94% 93% 93% (P) 91%  Weight:      Height:       Supplemental O2: Nasal Cannula SpO2: (P) 91 % O2 Flow Rate (L/min): 2 L/min FiO2 (%): (!) 2 %   Physical Exam:   Constitutional: Appears ill, laying in bed, no apparent distress HENT: normocephalic atraumatic, mucous membranes moist Eyes: conjunctiva non-erythematous Cardiovascular: RR, NSR  Pulmonary/Chest: +crackles LUL, diminished breath sounds bilateral lower lung fields  Abdominal: Soft, nontender MSK: Warm to touch, no lower extremity edema bilaterally Neurological: Awake, alert, following commands.  Answering questions appropriately. Skin: warm and dry Psych: Pleasant  Filed Weights   10/04/24 0810  Weight: 60.3 kg     Intake/Output Summary (Last 24 hours) at 10/07/2024 1053 Last data filed at 10/07/2024 1007 Gross per 24 hour  Intake 924.26 ml  Output 300 ml  Net 624.26 ml    Net IO Since Admission: 1,367.74 mL [10/07/24 1053]  Pertinent Labs:    Latest Ref Rng & Units 10/07/2024    4:00 AM 10/06/2024    6:14 AM 10/05/2024    5:00 AM  CBC  WBC 4.0 - 10.5 K/uL 18.4  14.7  15.2   Hemoglobin 12.0 - 15.0 g/dL 9.6  9.8  9.1   Hematocrit 36.0 - 46.0 % 29.8  30.5  29.7   Platelets 150 - 400 K/uL 670  710  697        Latest Ref Rng & Units 10/07/2024    4:00 AM 10/06/2024    6:14 AM 10/05/2024    7:45 PM  CMP  Glucose 70 - 99 mg/dL 87  80    BUN 8 - 23 mg/dL 30  38    Creatinine 9.55 - 1.00 mg/dL 9.41  9.12    Sodium 864 - 145 mmol/L 139  142    Potassium 3.5 - 5.1 mmol/L 3.3  4.0    Chloride 98 - 111 mmol/L 99  100    CO2 22 - 32 mmol/L 30  30    Calcium 8.9 - 10.3 mg/dL 8.1  8.3    Total Protein 6.5 - 8.1 g/dL   5.7    VBG: pH 7.4, pCO2 44, bicarb 33.5  Imaging: CXR this AM showed   1. Bibasilar opacities and pulmonary edema, stable. 2. Moderate bilateral pleural effusions, unchanged. 3. Left chest tube in place  Assessment/Plan:  Principal Problem:   Paroxysmal supraventricular tachycardia Active Problems:   Chronic anticoagulation   Community acquired pneumonia   Metastatic malignant neoplasm (HCC)   Malignant pleural effusion (HCC)   Acute respiratory failure with hypoxia and hypercapnia (HCC)   Patient Summary: Caitlin [name] is a 69 y.o. 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 S/p multiple lumpectomies and partial right mastectomy. Received chemo, radiation, immunotherapy. Suffers unfortunately from malignant pleural effusions. Missed palliative chemo due to the above problems.    -- Appreciate palliative care assistance per meeting today  -- NO BIPAP (patient does not want to go on Bipap) if respiratory decline > notify Husband and transition to comfort focus   -- [facility] > need to verify if they will enroll with palliative chemo    -- If yes then will need DME hospital bed, wheelchair, and BSC.     -- PT/OT orders for DME needs   -- R sided Pleurx planned tomorrow, does not need to be NPO  - Tylenol  500 mg every 4 as needed for pain -- Toradol  15 mg every 6 hours as needed for pain control  Sepsis  lactic acidosis  respiratory acidosis w/ metabolic alkalosis compensation > improved  leukocytosis Bilateral lobe pneumonia L > R  Recurrent bilateral malignant pleural effusions status post L PleurX catheter 11/25 Acute on Chronic hypoxic respiratory failure on 2 to 3 L oxygen at baseline S/p thoracentesis R pleural effusion with 1.3 L dark amber fluid drained and sent for pleural studies.  R Pleural studies showed total nucleated cell count 1538 (5% neutrophilic count) > cultures without any growth VBG today > pH 7.4, pCO2 44, bicarb 33.5 On evaluation this morning, on 2 L, breathing comfortably, no apparent respiratory distress -Follow-up pleural studies, calculate SAAG -Continue Zosyn  day 4, [completed 2 days of Azithro and 3 days of Doxy]  -Albuterol  as needed -Pulmicort  daily -R Pleurx catheter placement tomorrow by PCCM   -Appreciate PCCM recommendations   PAF, now NSR  SVT Recent PE s/p thrombectomy on Eliquis  Rates 70s-110s, NSR, on Amio PO taper dose Holding Eliquis  until Pleurx tomorrow (Lovenox )  Magnesium 2.1, potassium 3.3 (repleted)  Right upper extremity edema 2/2 radiation/mastectomy/lumpectomy  Present, mainly lymphedema  Chronic constipation Senokot twice daily, MiraLAX  daily.  LBM 10/06/2024  Normocytic anemia/anemia of chronic disease Thrombocytosis Hemoglobin 9.6, appears stable from baseline.  No acute signs or symptoms  of bleed.   Hypothyroidism - Continue Synthroid  100 mcg daily  Diet: Normal IVF: None  VTE: Lovenox  Wounds: Assessed by RN  Code: DNR/DNI PT/OT recs: Palliative care meeting Family Update: Husband at bedside   Dispo: Anticipated discharge to Home in 2 days pending adequate management.   Signature: Toma Edwards, D.O.  Internal Medicine Resident, PGY-2 [facility]  10:53 AM, 10/07/2024   Please contact the on call pager after 5 pm and on weekends at [phone].  "

## 2024-10-07 NOTE — Evaluation (Signed)
 Occupational Therapy Evaluation and Discharge Patient Details Name: Caitlin [name] MRN: [id number] DOB: 10-03-1956 Today's Date: 10/07/2024   History of Present Illness   Pt is a 69 y.o. F presenting [facility] with chest tightness, sweats, and cough. PMH is significant for A-fib, end-stage triple negative breast CA s/p R total mastectomy on palliative chemo, lymphedema R arm s/p radiation, hypothyroidism, PE, SVT, BL malignant pleural effusions, and chronic hypoxic respiratory failure on 2-3L oxygen baseline.     Clinical Impressions Pt with weakness, impaired balance and decreased activity tolerance. Declined OOB to chair. Husband has demonstrated ability to assist with bed mobility and transfers. Educated in pressure relief every 2 hours while in bed and to consider mattress overlay for home hospital bed as pt is not able to perform regular pressure relief on her own. Encouraged use of hospice aide to reduce level of care family will provide, showering on pt's good days vs bed bath on weaker days. Family is seeking private agencies for caregivers per chart. Anticipates returning home after pleurx placed tomorrow and once equipment is set up at home. No further OT needs.      If plan is discharge home, recommend the following:   A lot of help with walking and/or transfers;A lot of help with bathing/dressing/bathroom;Assistance with cooking/housework;Direct supervision/assist for financial management;Assist for transportation;Help with stairs or ramp for entrance     Functional Status Assessment   Patient has had a recent decline in their functional status and demonstrates the ability to make significant improvements in function in a reasonable and predictable amount of time.     Equipment Recommendations   BSC/3in1;Wheelchair (measurements OT);Wheelchair cushion (measurements OT);Hospital bed     Recommendations for Other Services          Precautions/Restrictions   Precautions Precautions: Fall Recall of Precautions/Restrictions: Intact Restrictions Weight Bearing Restrictions Per Provider Order: No     Mobility Bed Mobility Overal bed mobility: Needs Assistance Bed Mobility: Supine to Sit, Sit to Supine     Supine to sit: Mod assist, HOB elevated Sit to supine: Mod assist        Transfers                          Balance Overall balance assessment: Needs assistance Sitting-balance support: Bilateral upper extremity supported, Feet supported Sitting balance-Leahy Scale: Poor     Standing balance support: Bilateral upper extremity supported, During functional activity, Reliant on assistive device for balance Standing balance-Leahy Scale: Poor                             ADL either performed or assessed with clinical judgement   ADL Overall ADL's : Needs assistance/impaired Eating/Feeding: Set up;Bed level   Grooming: Set up;Bed level   Upper Body Bathing: Moderate assistance;Sitting   Lower Body Bathing: Maximal assistance;Bed level   Upper Body Dressing : Minimal assistance;Sitting   Lower Body Dressing: Maximal assistance;Bed level   Toilet Transfer: Moderate assistance;BSC/3in1;Stand-pivot   Toileting- Clothing Manipulation and Hygiene: Set up;Sitting/lateral lean               Vision Baseline Vision/History: 1 Wears glasses Ability to See in Adequate Light: 0 Adequate Patient Visual Report: No change from baseline       Perception         Praxis         Pertinent Vitals/Pain Pain Assessment Pain  Assessment: Faces Pain Score: 0-No pain Pain Intervention(s): Premedicated before session     Extremity/Trunk Assessment Upper Extremity Assessment Upper Extremity Assessment: Generalized weakness   Lower Extremity Assessment Lower Extremity Assessment: Defer to PT evaluation   Cervical / Trunk Assessment Cervical / Trunk Assessment: Other  exceptions (scoliotic, weakness)   Communication Communication Communication: Impaired Factors Affecting Communication: Other (comment) (minimally verbal)   Cognition Arousal: Alert Behavior During Therapy: WFL for tasks assessed/performed               OT - Cognition Comments: pt minimally verbal, cognition appears intact                 Following commands: Intact       Cueing  General Comments   Cueing Techniques: Verbal cues      Exercises     Shoulder Instructions      Home Living Family/patient expects to be discharged to:: Private residence Living Arrangements: Spouse/significant other Available Help at Discharge: Family;Available 24 hours/day Type of Home: House Home Access: Elevator     Home Layout: Multi-level Alternate Level Stairs-Number of Steps: Consulting Civil Engineer Shower/Tub: Producer, Television/film/video: Standard     Home Equipment: Rollator (4 wheels)   Additional Comments: awaiting delivery of hospital bed, w/c, BSC      Prior Functioning/Environment Prior Level of Function : Independent/Modified Independent             Mobility Comments: mod I using rollator for mobility ADLs Comments: indep    OT Problem List:     OT Treatment/Interventions:        OT Goals(Current goals can be found in the care plan section)       OT Frequency:       Co-evaluation              AM-PAC OT 6 Clicks Daily Activity     Outcome Measure Help from another person eating meals?: A Little Help from another person taking care of personal grooming?: A Little Help from another person toileting, which includes using toliet, bedpan, or urinal?: A Lot Help from another person bathing (including washing, rinsing, drying)?: A Lot Help from another person to put on and taking off regular upper body clothing?: A Lot Help from another person to put on and taking off regular lower body clothing?: A Lot 6 Click Score: 14   End of  Session    Activity Tolerance: Patient limited by fatigue Patient left: in bed;with call bell/phone within reach;with bed alarm set;with family/visitor present  OT Visit Diagnosis: Muscle weakness (generalized) (M62.81);Pain                Time: 8482-8467 OT Time Calculation (min): 15 min Charges:  OT General Charges $OT Visit: 1 Visit OT Evaluation $OT Eval Low Complexity: 1 Low  Mliss HERO, OTR/L Acute Rehabilitation Services Office: [phone]   Kennth Mliss Helling 10/07/2024, 3:42 PM

## 2024-10-07 NOTE — Progress Notes (Signed)
" °   10/07/24 2320  BiPAP/CPAP/SIPAP  Reason BIPAP/CPAP not in use Non-compliant (Pt. refused)  BiPAP/CPAP /SiPAP Vitals  Temp 97.7 F (36.5 C)  Pulse Rate 80  Resp 19  BP 112/78  SpO2 97 %  MEWS Score/Color  MEWS Score 0  MEWS Score Color Green    "

## 2024-10-07 NOTE — Progress Notes (Signed)
"                                                                                                                                                                                                         Daily Progress Note   Patient Name: Caitlin [name]       Date: 10/07/2024 DOB: Dec 14, 1955  Age: 68 y.o. MRN#: [id number] Attending Physician: Shawn Sick, MD Primary Care Physician: Toribio Jerel MATSU, MD Admit Date: 10/04/2024  Reason for Consultation/Follow-up: Establishing goals of care  Objective: I have reviewed medical records including: EPIC notes: IM, nursing, cardiology, surgery, TOC, PT.  Plan for PleurX catheter placement tomorrow 1/5.   MAR: IV amiodarone  was transition to oral yesterday.  On IV antibiotics.  As needed medications administered in the last 24 hours-albuterol  x 1, Toradol  x 1, oxycodone  x 1 Available advanced directives: in ACP - none. In shadow chart - none. Durable DNR form completed and placed in shadow chart. Copy was made and will be scanned into Vynca/ACP tab Labs personally reviewed: WBC up from yesterday 18.4> 14.7  Subjective: Unable to receive report from primary RN.  Went to visit patient at bedside-husband/[name] present.  Patient was lying in bed awake, alert, oriented, and able to participate in conversation. No signs or non-verbal gestures of pain or discomfort noted. No respiratory distress, increased work of breathing, or secretions noted.  She is on 2 L O2 nasal cannula.  Patient reports feeling tired today.  Deward notes she is having a lazy day.  Emotional support provided to patient and Deward.  Therapeutic listening provided as Deward reflects on the plan for PleurX catheter placement tomorrow and discharging home with hospice on Tuesday.  He has been in discussions with the hospice team in regards to DME delivery.  Deward also notes that patient was able to get up with PT and she has gotten up to the bedside commode several times.  Chocolate Glucerna  provided the patient per her request.  All questions and concerns addressed. Encouraged to call with questions and/or concerns. PMT card previously provided.  Length of Stay: 2  Current Medications: Scheduled Meds:   [START ON 10/27/2024] amiodarone   200 mg Oral Daily   amiodarone   400 mg Oral BID   [START ON 10/18/2024] amiodarone   400 mg Oral Daily   budesonide  (PULMICORT ) nebulizer solution  0.25 mg Nebulization Daily   levothyroxine   100 mcg Oral Q0600   polyethylene glycol  17 g Oral Daily   senna-docusate  1 tablet Oral BID    Continuous Infusions:  piperacillin -tazobactam (ZOSYN )  IV 3.375 g (10/07/24 1238)    PRN Meds: acetaminophen  **OR** acetaminophen , albuterol , ketorolac , oxyCODONE , oxyCODONE   Physical Exam Vitals and nursing note reviewed.  Constitutional:      General: She is not in acute distress. Pulmonary:     Effort: Pulmonary effort is normal. No respiratory distress.  Skin:    General: Skin is warm and dry.  Neurological:     Mental Status: She is alert and oriented to person, place, and time.  Psychiatric:        Attention and Perception: Attention normal.        Behavior: Behavior is cooperative.        Cognition and Memory: Cognition and memory normal.             Vital Signs: BP 98/73 (BP Location: Left Wrist)   Pulse 97   Temp 99 F (37.2 C) (Oral)   Resp 20   Ht 5' 3 (1.6 m)   Wt 60.3 kg   SpO2 95%   BMI 23.56 kg/m  SpO2: SpO2: 95 % O2 Device: O2 Device: Nasal Cannula O2 Flow Rate: O2 Flow Rate (L/min): 2 L/min  Intake/output summary:  Intake/Output Summary (Last 24 hours) at 10/07/2024 1307 Last data filed at 10/07/2024 1007 Gross per 24 hour  Intake 924.26 ml  Output 300 ml  Net 624.26 ml   LBM: Last BM Date : 10/06/24 Baseline Weight: Weight: 60.3 kg Most recent weight: Weight: 60.3 kg       Palliative Assessment/Data: PPS 50-60%      Patient Active Problem List   Diagnosis Date Noted   Malignant pleural effusion  (HCC) 10/05/2024   Acute respiratory failure with hypoxia and hypercapnia (HCC) 10/05/2024   Community acquired pneumonia 10/04/2024   Paroxysmal supraventricular tachycardia 10/04/2024   Metastatic malignant neoplasm (HCC) 10/04/2024   Pulmonary embolism (HCC) 08/25/2024   S/P mastectomy, right 05/03/2023   Port-A-Cath in place 12/16/2022   Genetic testing 10/11/2022   Appendicitis with peritonitis 09/29/2022   Family history of prostate cancer 09/29/2022   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female, estrogen receptor positive (HCC) 09/22/2022   Atrial fibrillation with RVR (HCC) 11/30/2010   Chronic anticoagulation 11/30/2010   Hypothyroidism 11/30/2010   PALPITATIONS 11/27/2010    Palliative Care Assessment & Plan   Patient Profile: 69 y.o. 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    Of note, patient has had 2 admissions and 2 ED visits in the last 6 months.  Assessment: Principal Problem:   Paroxysmal supraventricular tachycardia Active Problems:   Chronic anticoagulation   Community acquired pneumonia   Metastatic malignant neoplasm (HCC)   Malignant pleural effusion (HCC)   Acute respiratory failure with hypoxia and hypercapnia (HCC)   Concern about end of life  Recommendations/Plan: Continue to treat the treatable Continue DNR/DNI as previously documented Proceed with PleurX catheter placement on Monday 1/5 with discharge goal of Tuesday 1/6 Plan is for discharge home with hospice after medical  optimization; however, will need to see if they will enroll patient while still receiving palliative chemotherapy. She wishes to continue  palliative chemo for management of swelling/pain. If hospice can support this, patient will need DME delivered prior to discharge In the event of another respiratory decline, do not escalate to Bipap - transition to full comfort PMT will continue to follow peripherally. If there are any imminent needs please call the service directly   Goals of Care and Additional Recommendations: Limitations on Scope of Treatment: Avoid Hospitalization and No Tracheostomy  Code Status:    Code Status Orders  (From admission, onward)           Start     Ordered   10/05/24 1325  Do not attempt resuscitation (DNR)- Limited -Do Not Intubate (DNI)  (Code Status)  Continuous       Question Answer Comment  If pulseless and not breathing No CPR or chest compressions.   In Pre-Arrest Conditions (Patient Is Breathing and Has A Pulse) Do not intubate. Provide all appropriate non-invasive medical interventions. Avoid ICU transfer unless indicated or required.   Consent: Discussion documented in EHR or advanced directives reviewed      10/05/24 1325           Code Status History     Date Active Date Inactive Code Status Order ID Comments User Context   10/04/2024 1148 10/05/2024 1325 Full Code [id number]  Harrie Bruckner, DO ED   08/25/2024 1452 08/29/2024 1829 Full Code [id number]  Catherine Cools, MD ED   05/03/2023 1120 05/04/2023 1327 Full Code [id number]  Ebbie Cough, MD Inpatient      Advance Directive Documentation    Flowsheet Row Most Recent Value  Type of Advance Directive Healthcare Power of Attorney  Pre-existing out of facility DNR order (yellow form or pink MOST form) --  MOST Form in Place? --    Prognosis:  < 6 months  Discharge Planning: Home with Hospice  Care plan was discussed with patient, patient's husband  Thank you for  allowing the Palliative Medicine Team to assist in the care of this patient.   Total Time 35 minutes Prolonged Time Billed  no       Jeoffrey CHRISTELLA Sharps, NP  Please contact Palliative Medicine Team phone at [phone] for questions and concerns.   *Portions of this note are a verbal dictation therefore any spelling and/or grammatical errors are due to the Dragon Medical One system interpretation.     "

## 2024-10-07 NOTE — Progress Notes (Signed)
.. ° ° °  PROCEDURAL EXPEDITER PROGRESS NOTE  Patient Name: Caitlin [name]  DOB:[birth year] Date of Admission: 10/04/2024  Date of Assessment:10/07/2024   -------------------------------------------------------------------------------------------------------------------   Brief clinical summary: Pt to have RIGHT pleurX insertion and LEFT pleurX removal done on tomorrow  Orders in place:  Yes   Labs, test, and orders reviewed: Y  Requires surgical clearance:  No  Barriers noted: N/A   -------------------------------------------------------------------------------------------------------------------  [facility], [place], [place] Please contact us  directly via secure chat (search for [facility]) or by calling us  at [phone] [facility]).

## 2024-10-08 ENCOUNTER — Encounter (HOSPITAL_COMMUNITY): Admission: EM | Disposition: A | Payer: Self-pay | Attending: Infectious Diseases

## 2024-10-08 ENCOUNTER — Inpatient Hospital Stay (HOSPITAL_COMMUNITY)

## 2024-10-08 ENCOUNTER — Encounter (HOSPITAL_COMMUNITY): Payer: Self-pay | Admitting: Infectious Diseases

## 2024-10-08 DIAGNOSIS — J910 Malignant pleural effusion: Secondary | ICD-10-CM | POA: Diagnosis not present

## 2024-10-08 DIAGNOSIS — R609 Edema, unspecified: Secondary | ICD-10-CM | POA: Diagnosis not present

## 2024-10-08 DIAGNOSIS — J189 Pneumonia, unspecified organism: Secondary | ICD-10-CM | POA: Diagnosis not present

## 2024-10-08 DIAGNOSIS — D539 Nutritional anemia, unspecified: Secondary | ICD-10-CM

## 2024-10-08 DIAGNOSIS — D72829 Elevated white blood cell count, unspecified: Secondary | ICD-10-CM | POA: Diagnosis not present

## 2024-10-08 DIAGNOSIS — E039 Hypothyroidism, unspecified: Secondary | ICD-10-CM | POA: Diagnosis not present

## 2024-10-08 DIAGNOSIS — C50911 Malignant neoplasm of unspecified site of right female breast: Secondary | ICD-10-CM | POA: Diagnosis not present

## 2024-10-08 DIAGNOSIS — J90 Pleural effusion, not elsewhere classified: Secondary | ICD-10-CM

## 2024-10-08 DIAGNOSIS — J9621 Acute and chronic respiratory failure with hypoxia: Secondary | ICD-10-CM | POA: Diagnosis not present

## 2024-10-08 DIAGNOSIS — Z7901 Long term (current) use of anticoagulants: Secondary | ICD-10-CM | POA: Diagnosis not present

## 2024-10-08 DIAGNOSIS — J9602 Acute respiratory failure with hypercapnia: Secondary | ICD-10-CM | POA: Diagnosis not present

## 2024-10-08 DIAGNOSIS — E8729 Other acidosis: Secondary | ICD-10-CM | POA: Diagnosis not present

## 2024-10-08 DIAGNOSIS — I471 Supraventricular tachycardia, unspecified: Secondary | ICD-10-CM | POA: Diagnosis not present

## 2024-10-08 HISTORY — PX: REMOVAL OF PLEURAL DRAINAGE CATHETER: SHX5080

## 2024-10-08 HISTORY — PX: CHEST TUBE INSERTION: SHX231

## 2024-10-08 LAB — CBC
HCT: 31.6 % — ABNORMAL LOW (ref 36.0–46.0)
Hemoglobin: 10 g/dL — ABNORMAL LOW (ref 12.0–15.0)
MCH: 31.3 pg (ref 26.0–34.0)
MCHC: 31.6 g/dL (ref 30.0–36.0)
MCV: 98.8 fL (ref 80.0–100.0)
Platelets: 620 K/uL — ABNORMAL HIGH (ref 150–400)
RBC: 3.2 MIL/uL — ABNORMAL LOW (ref 3.87–5.11)
RDW: 17.5 % — ABNORMAL HIGH (ref 11.5–15.5)
WBC: 22.2 K/uL — ABNORMAL HIGH (ref 4.0–10.5)
nRBC: 0 % (ref 0.0–0.2)

## 2024-10-08 LAB — BODY FLUID CULTURE W GRAM STAIN: Culture: NO GROWTH

## 2024-10-08 LAB — BLOOD GAS, VENOUS
Acid-Base Excess: 9.1 mmol/L — ABNORMAL HIGH (ref 0.0–2.0)
Bicarbonate: 35.2 mmol/L — ABNORMAL HIGH (ref 20.0–28.0)
Drawn by: 70495
O2 Saturation: 89 %
Patient temperature: 36.6
pCO2, Ven: 52 mmHg (ref 44–60)
pH, Ven: 7.44 — ABNORMAL HIGH (ref 7.25–7.43)
pO2, Ven: 55 mmHg — ABNORMAL HIGH (ref 32–45)

## 2024-10-08 LAB — BASIC METABOLIC PANEL WITH GFR
Anion gap: 11 (ref 5–15)
BUN: 26 mg/dL — ABNORMAL HIGH (ref 8–23)
CO2: 31 mmol/L (ref 22–32)
Calcium: 8.3 mg/dL — ABNORMAL LOW (ref 8.9–10.3)
Chloride: 97 mmol/L — ABNORMAL LOW (ref 98–111)
Creatinine, Ser: 0.65 mg/dL (ref 0.44–1.00)
GFR, Estimated: >60 mL/min
Glucose, Bld: 93 mg/dL (ref 70–99)
Potassium: 3.9 mmol/L (ref 3.5–5.1)
Sodium: 139 mmol/L (ref 135–145)

## 2024-10-08 LAB — MAGNESIUM: Magnesium: 2.2 mg/dL (ref 1.7–2.4)

## 2024-10-08 MED ORDER — APIXABAN 5 MG PO TABS: 5.0000 mg | ORAL_TABLET | Freq: Two times a day (BID) | ORAL | Status: DC

## 2024-10-08 MED ORDER — MIDAZOLAM HCL 2 MG/2ML IJ SOLN
INTRAMUSCULAR | Status: AC
  Filled 2024-10-08: qty 2

## 2024-10-08 MED ORDER — APIXABAN 5 MG PO TABS
5.0000 mg | ORAL_TABLET | Freq: Two times a day (BID) | ORAL | Status: DC
  Administered 2024-10-08 – 2024-10-09 (×2): 5 mg via ORAL
  Filled 2024-10-08 (×2): qty 1

## 2024-10-08 MED ORDER — MIDAZOLAM HCL (PF) 2 MG/2ML IJ SOLN
INTRAMUSCULAR | Status: DC | PRN
  Administered 2024-10-08: 1 mg via INTRAVENOUS

## 2024-10-08 MED ORDER — POTASSIUM CHLORIDE 20 MEQ PO PACK
20.0000 meq | PACK | Freq: Once | ORAL | Status: AC
  Administered 2024-10-08: 20 meq via ORAL
  Filled 2024-10-08: qty 1

## 2024-10-08 MED ORDER — FUROSEMIDE 20 MG PO TABS
20.0000 mg | ORAL_TABLET | Freq: Every day | ORAL | Status: DC
  Administered 2024-10-08: 20 mg via ORAL
  Filled 2024-10-08: qty 1

## 2024-10-08 MED ORDER — FENTANYL CITRATE (PF) 100 MCG/2ML IJ SOLN
INTRAMUSCULAR | Status: AC
  Filled 2024-10-08: qty 2

## 2024-10-08 NOTE — Consult Note (Signed)
 WOC Nurse Consult Note:  WOC consult performed remotely utilizing imaging and chart review  Reason for Consult:R side radiation burns  Wound type:  R upper and middle back area of intense discoloration with scattered raised areas of tissues dark red in appearance, areas of black tissue noted in the superior aspect. Chart review completed, patient completed radiation treatment October 2024, current wound presentation is unlikely to be related to radiation burns.  Further chart review reveals presence of a large infiltrating mass right latissimus dorsi muscle Pressure Injury POA: No Measurement: see nursing flow sheets Wound bed: see above Drainage (amount, consistency, odor) see nursing flow sheets Periwound: intact Dressing procedure/placement/frequency:  R upper and middle back:  Cleanse with NS, pat dry.  Place Xeroform over wound and cover with silicone foam dressing.  Change daily.    WOC team will not follow patient at this time, please re consult if new needs arise or wound deteriorates.  Thank you,  Doyal Polite, MSN, RN, [facility] WOC Team [phone] (Available Mon-Fri 0700-1500)

## 2024-10-08 NOTE — Progress Notes (Signed)
 Interventional Radiology Brief Note:  IR consulted for PleurX eval and placement for Caitlin [name].  Appears pulmonology is also following for same and has scheduled her for left pleurX removal/right pleurX placement in Endo this afternoon at 2pm.  Will cancel IR order.  Please reconsult if IR can be of assistance.   Zaliah Wissner, MS RD PA-C

## 2024-10-08 NOTE — Procedures (Signed)
 PleurX Insertion Procedure Note  QUINCEE GITTENS  [id number]  1956-02-15  Date:10/08/2024  Time:2:29 PM   Provider Performing:Thompson Mckim JAYSON Sharps  Procedure: RIGHT PleurX Tunneled Pleural Catheter Placement (32550)  Indication(s) Relief of dyspnea from recurrent effusion  Consent Risks of the procedure as well as the alternatives and risks of each were explained to the patient and/or caregiver.  Consent for the procedure was obtained.   Anesthesia 1mg  versed  Topical only with 1% lidocaine     Time Out Verified patient identification, verified procedure, site/side was marked, verified correct patient position, special equipment/implants available, medications/allergies/relevant history reviewed, required imaging and test results available.   Sterile Technique Maximal sterile technique including sterile barrier drape, hand hygiene, sterile gown, sterile gloves, mask, hair covering.    Procedure Description Ultrasound used to identify appropriate pleural anatomy for placement and overlying skin marked.  Area of drainage cleaned and draped in sterile fashion.   Lidocaine  was used to anesthetize the skin and subcutaneous tissue.   1.5 cm incision made overlying fluid and another about 5 cm anterior to this along chest wall.  PleurX catheter inserted in usual sterile fashion using modified seldinger technique.  Interrupted silk sutures placed at catheter insertion and tunneling points which will be removed at later date.  PleurX catheter then hooked to suction and 900cc dark fluid removed.  After fluid aspirated, pleurX capped and sterile dressing applied.   Complications/Tolerance None; patient tolerated the procedure well. Chest X-ray is ordered to confirm no post-procedural complication.   EBL Minimal   Specimen(s) none

## 2024-10-08 NOTE — TOC Initial Note (Addendum)
 Transition of Care [facility]) - Initial/Assessment Note    Patient Details  Name: Caitlin [name] MRN: [id number] Date of Birth: [birth year]  [facility] CM/SW Contact:    Sudie Erminio Deems, RN Phone Number: 10/08/2024, 3:27 PM  Clinical Narrative: ICM received consult for Hospice Services with [facility]. Weekend ICM submitted the referral. ICM did call [facility] and the patient can receive Hospice services with palliative chemo per liaison. [facility] states DME has been ordered via [employer] (hospital bed, bedside commode, overbed table, and transport wheelchair). ICM spoke with spouse and DME will be delivered this afternoon around 1600. Spouse states he did receive a list of personal care agencies. Family will be with the patient initially and spouse will reach out to a personal care agency if needed. Daughter was in the room at the time of the visit and she states patient can be transported home via private vehicle. ICM will continue to follow for additional disposition needs.                    Expected Discharge Plan: Home w Hospice Care Barriers to Discharge: No Barriers Identified   Patient Goals and CMS Choice Patient states their goals for this hospitalization and ongoing recovery are:: Plan to return home with hospice          Expected Discharge Plan and Services   Discharge Planning Services: CM Consult Post Acute Care Choice: Hospice Living arrangements for the past 2 months: Single Family Home                 DME Arranged: Hospital bed, Bedside commode, Lightweight manual wheelchair with seat cushion (overbed table) DME Agency: [facility] Practitioner spoke with at [employer] Agency: [facility]. HH Arranged: RN HH Agency: [facility] ([place]) Date HH Agency Contacted: 10/08/24 Time HH Agency Contacted: 1526 Representative spoke with at [facility] Agency: [facility]  Prior Living Arrangements/Services Living  arrangements for the past 2 months: Single Family Home Lives with:: Spouse Patient language and need for interpreter reviewed:: Yes Do you feel safe going back to the place where you live?: Yes      Need for Family Participation in Patient Care: Yes (Comment) Care giver support system in place?: Yes (comment)   Criminal Activity/Legal Involvement Pertinent to Current Situation/Hospitalization: No - Comment as needed  Activities of Daily Living   ADL Screening (condition at time of admission) Independently performs ADLs?: No Does the patient have a NEW difficulty with bathing/dressing/toileting/self-feeding that is expected to last >3 days?: Yes (Initiates electronic notice to provider for possible OT consult) Does the patient have a NEW difficulty with getting in/out of bed, walking, or climbing stairs that is expected to last >3 days?: Yes (Initiates electronic notice to provider for possible PT consult) Does the patient have a NEW difficulty with communication that is expected to last >3 days?: Yes (Initiates electronic notice to provider for possible SLP consult) Is the patient deaf or have difficulty hearing?: No Does the patient have difficulty seeing, even when wearing glasses/contacts?: No Does the patient have difficulty concentrating, remembering, or making decisions?: No  Permission Sought/Granted Permission sought to share information with : Family Supports, Case Manager                Emotional Assessment Appearance:: Appears stated age         Psych Involvement: No (comment)  Admission diagnosis:  SVT (supraventricular tachycardia) [I47.10] Community acquired pneumonia [J18.9]  Metastatic malignant neoplasm, unspecified site [facility]) [C79.9] Community acquired pneumonia of left lower lobe of lung [J18.9] Patient Active Problem List   Diagnosis Date Noted   Pleural effusion 10/08/2024   Malignant pleural effusion (HCC) 10/05/2024   Acute respiratory failure with  hypoxia and hypercapnia (HCC) 10/05/2024   Community acquired pneumonia 10/04/2024   Paroxysmal supraventricular tachycardia 10/04/2024   Metastatic malignant neoplasm (HCC) 10/04/2024   Pulmonary embolism (HCC) 08/25/2024   S/P mastectomy, right 05/03/2023   Port-A-Cath in place 12/16/2022   Genetic testing 10/11/2022   Appendicitis with peritonitis 09/29/2022   Family history of prostate cancer 09/29/2022   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female, estrogen receptor positive (HCC) 09/22/2022   Atrial fibrillation with RVR (HCC) 11/30/2010   Chronic anticoagulation 11/30/2010   Hypothyroidism 11/30/2010   PALPITATIONS 11/27/2010   PCP:  Toribio Jerel MATSU, MD Pharmacy:   [facility] [place], [place] - [address] [place] [address] Phone: [phone] Fax: [phone]  [place] [facility] [address] [place] [address] Phone: [phone] Fax: [phone]  [facility] [phone] - [place], [place] - [address] [place] [address] [place] [address] [place] [address] Phone: [phone] Fax: [phone]     Social Drivers of Health (SDOH) Social History: SDOH Screenings   Food Insecurity: No Food Insecurity (10/05/2024)  Housing: Low Risk (10/05/2024)  Transportation Needs: No Transportation Needs (10/05/2024)  Utilities: Not At Risk (10/05/2024)  Alcohol Screen: Low Risk (05/27/2023)  Depression (PHQ2-9): Low Risk (05/27/2023)  Financial Resource Strain: Low Risk (09/03/2024)   Received from [facility]  Physical Activity: Inactive (09/03/2024)   Received from [facility]  Social Connections: Moderately Integrated (10/05/2024)  Stress: Stress Concern Present (09/03/2024)   Received from [facility]  Tobacco Use: Unknown (10/04/2024)  Health Literacy: Low Risk (11/12/2022)   Received from [facility]   SDOH Interventions:     Readmission Risk Interventions     08/28/2024    1:48 PM  Readmission Risk Prevention Plan  Transportation Screening Complete  PCP or Specialist Appt within 5-7 Days Complete  Home Care Screening Complete  Medication Review (RN CM) Complete

## 2024-10-08 NOTE — Progress Notes (Signed)
"   Progress Note  Patient Name: Caitlin [name] Date of Encounter: 10/08/2024  Primary Cardiologist:   None   Subjective   OV, received additional IV Lasix  40 mg, due to increased work of breathing.  Endorses improved breathing, extremities with less edema.  Patient husband Dr. Soja at bedside  Telemetry personally reviewed NSR.  Inpatient Medications    Scheduled Meds:  [START ON 10/27/2024] amiodarone   200 mg Oral Daily   amiodarone   400 mg Oral BID   [START ON 10/25/2024] amiodarone   400 mg Oral Daily   budesonide  (PULMICORT ) nebulizer solution  0.25 mg Nebulization Daily   feeding supplement  237 mL Oral BID BM   levothyroxine   100 mcg Oral Q0600   polyethylene glycol  17 g Oral Daily   senna-docusate  1 tablet Oral BID   Continuous Infusions:  piperacillin -tazobactam (ZOSYN )  IV 3.375 g (10/08/24 0303)   PRN Meds: acetaminophen  **OR** acetaminophen , albuterol , ketorolac , oxyCODONE , oxyCODONE    Vital Signs    VS stable, 3.5 L [place] Urine output 1.8 L Vitals:   10/07/24 1719 10/07/24 2005 10/07/24 2320 10/08/24 0325  BP: 112/69 111/71 112/78 115/73  Pulse: 93 95 80 82  Resp: 20 (!) 22 19 18   Temp: 98.2 F (36.8 C) 98.5 F (36.9 C) 97.7 F (36.5 C) 98.1 F (36.7 C)  TempSrc: Oral Oral Oral Oral  SpO2: 95% 94% 97% 95%  Weight:      Height:        Intake/Output Summary (Last 24 hours) at 10/08/2024 0831 Last data filed at 10/08/2024 0303 Gross per 24 hour  Intake 702.58 ml  Output 975 ml  Net -272.42 ml   Filed Weights   10/04/24 0810  Weight: 60.3 kg    Telemetry    Normal sinus rhythm- Personally Reviewed  ECG    NA - Personally Reviewed  Physical Exam   GEN: Well nourished, well developed in no acute distress HEENT: Normal NECK: No JVD; No carotid bruits LYMPHATICS: No lymphadenopathy CARDIAC:RRR, no murmurs, rubs, gallops RESPIRATORY: Clear lungs bilaterally, decreased air movement.  No wheezes ABDOMEN: Soft, non-tender,  non-distended MUSCULOSKELETAL:  No edema; No deformity  SKIN: Warm and dry NEUROLOGIC:  Alert and oriented x 3 PSYCHIATRIC:  Normal affect    Labs    Chemistry Recent Labs  Lab 10/02/24 0656 10/04/24 0902 10/04/24 0912 10/05/24 1945 10/06/24 0614 10/07/24 0400 10/08/24 0527  NA 137 139   < >  --  142 139 139  K 4.0 4.9   < >  --  4.0 3.3* 3.9  CL 97* 100   < >  --  100 99 97*  CO2 25 26   < >  --  30 30 31   GLUCOSE 106* 106*   < >  --  80 87 93  BUN 26* 32*   < >  --  38* 30* 26*  CREATININE 0.66 0.64   < >  --  0.87 0.58 0.65  CALCIUM 9.1 9.1   < >  --  8.3* 8.1* 8.3*  PROT 6.4* 6.5  --  5.7*  --   --   --   ALBUMIN 3.1* 3.2*  --   --   --   --   --   AST 50* 44*  --   --   --   --   --   ALT 10 11  --   --   --   --   --  ALKPHOS 120 116  --   --   --   --   --   BILITOT 0.3 <0.2  --   --   --   --   --   GFRNONAA >60 >60   < >  --  >60 >60 >60  ANIONGAP 14 13   < >  --  12 10 11    < > = values in this interval not displayed.     Hematology Recent Labs  Lab 10/06/24 0614 10/07/24 0400 10/08/24 0527  WBC 14.7* 18.4* 22.2*  RBC 3.08* 3.08* 3.20*  HGB 9.8* 9.6* 10.0*  HCT 30.5* 29.8* 31.6*  MCV 99.0 96.8 98.8  MCH 31.8 31.2 31.3  MCHC 32.1 32.2 31.6  RDW 17.2* 17.5* 17.5*  PLT 710* 670* 620*    Cardiac EnzymesNo results for input(s): TROPONINI in the last 168 hours. No results for input(s): TROPIPOC in the last 168 hours.   BNPNo results for input(s): BNP, PROBNP in the last 168 hours.   DDimer No results for input(s): DDIMER in the last 168 hours.   Radiology    DG CHEST PORT 1 VIEW Result Date: 10/07/2024 EXAM: 1 VIEW(S) XRAY OF THE CHEST 10/07/2024 09:12:00 AM COMPARISON: 10/06/2024 CLINICAL HISTORY: Dyspnea FINDINGS: LINES, TUBES AND DEVICES: Left chest tube in place. LUNGS AND PLEURA: Moderate bilateral pleural effusions unchanged. Bibasilar opacities. Pulmonary edema stable. No pneumothorax. HEART AND MEDIASTINUM: No acute abnormality of  the cardiac and mediastinal silhouettes. BONES AND SOFT TISSUES: No acute osseous abnormality. IMPRESSION: 1. Bibasilar opacities and pulmonary edema, stable. 2. Moderate bilateral pleural effusions, unchanged. 3. Left chest tube in place. Electronically signed by: Waddell Calk MD 10/07/2024 09:30 AM EST RP Workstation: [id number]   DG CHEST PORT 1 VIEW Result Date: 10/06/2024 CLINICAL DATA:  Dyspnea EXAM: PORTABLE CHEST 1 VIEW COMPARISON:  Yesterday FINDINGS: Stable cardiomediastinal silhouette. Stable left-sided chest tube without pneumothorax. Stable bilateral loculated pleural effusions. Stable bibasilar opacities concerning for edema or possibly atelectasis. IMPRESSION: Stable left-sided chest tube. Stable bilateral loculated pleural effusions. Stable bibasilar opacities as described above. Electronically Signed   By: Lynwood Landy Raddle M.D.   On: 10/06/2024 17:40    Cardiac Studies   Echo:  See above.   Patient Profile     69 y.o. 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 Eben.   Her husband is Dr. Deward Soja a long time primary care MD in [place].   Assessment & Plan    SVT:    - Improving, recurrent over the last several weeks. Options are limited with her underling illness and hypotension.   - NSR on telemetry; Will continue amiodarone  400 mg twice dailyx 1 week(end date 1/10),then 400 mg daily x 1 week then 200 mg daily.  PAF:   - No further A-fib on telemetry - Now on p.o. Amio load as above - Holding  Eliquis  due to upcoming pleurX catheter insertion, resume later this evening if no bleeding from the site.   EDEMA:  -Much  improved, s/p IV Lasix   2 yesterday.  - okay to resume home dosing po lasix  40mg  PRN.  METASTATIC BREAST CANCER RIGHT MALIGNANT PLEURAL EFFUSION: -had thoracentesis on right  side a few days ago>>reaccumulated -s/p thoracentesis 1 to of 1.3L of dark amber fluid  - Patient is DNR limited - Scheduled for insertion of pleurX catheter today, potential discharge tomorrow.  PULMONARY EMBOLISM -s/p recent thrombectomy -Holding Eliquis  as above.   For questions or updates, please contact [facility] Please consult [email or web address] for contact info under Cardiology/STEMI.   Signed, Missy Sandhoff, MD  10/08/2024, 8:31 AM    "

## 2024-10-08 NOTE — Plan of Care (Signed)

## 2024-10-08 NOTE — Procedures (Signed)
 RT Note- Patient asked for a prn albuterol . After scanning med in, patient decided to wait as her SOB was improving and she did not have any wheezing.  Albuterol  not given.

## 2024-10-08 NOTE — Progress Notes (Signed)
 Physical Therapy Treatment Patient Details Name: Caitlin [name] MRN: [id number] DOB: 04-28-1956 Today's Date: 10/08/2024   History of Present Illness Pt is a 69 y.o. F presenting to [facility] on 10/04[facility]24 with chest tightness, sweats, and cough. PMH is significant for A-fib, end-stage triple negative breast CA s/p R total mastectomy on palliative chemo, lymphedema R arm s/p radiation, hypothyroidism, PE, SVT, BL malignant pleural effusions, and chronic hypoxic respiratory failure on 2-3L oxygen baseline.    PT Comments  Pt remains limited by fatigue at this time, tolerating transfers to/from BSC/hospital bed with assistance of pt's spouse. PT provides verbal cues for bed mobility sequencing initially. PT encourages mobility as tolerated and provides education related to energy conservation techniques. HHPT remains recommended at this time.   If plan is discharge home, recommend the following: A little help with walking and/or transfers;A little help with bathing/dressing/bathroom;Assistance with cooking/housework;Assist for transportation   Can travel by private vehicle        Equipment Recommendations  None recommended by PT (pt/family awaiting delivery of hospital bed, transport chair and a BSC)    Recommendations for Other Services       Precautions / Restrictions Precautions Precautions: Fall Recall of Precautions/Restrictions: Intact Restrictions Weight Bearing Restrictions Per Provider Order: No     Mobility  Bed Mobility Overal bed mobility: Needs Assistance Bed Mobility: Supine to Sit, Sit to Supine     Supine to sit: Min assist, HOB elevated Sit to supine: Contact guard assist, HOB elevated   General bed mobility comments: spouse assisting in bed mobility, PT provides initial cueing for sequencing    Transfers Overall transfer level: Needs assistance Equipment used: 1 person hand held assist Transfers: Sit to/from Stand, Bed to chair/wheelchair/BSC Sit to Stand: Min  assist (PT is unable to gauge assistance needs accurately as pt's spouse assists in transfer, appears to be minA)   Step pivot transfers: Min assist (PT is unable to gauge assistance needs accurately as pt's spouse assists in transfer, appears to be minA)       General transfer comment: pt declines transferring to recliner or ambulation after utilizing the [facility] due to fatigue    Ambulation/Gait                   Stairs             Wheelchair Mobility     Tilt Bed    Modified Rankin (Stroke Patients Only)       Balance Overall balance assessment: Needs assistance Sitting-balance support: No upper extremity supported, Feet supported Sitting balance-Leahy Scale: Fair     Standing balance support: Single extremity supported, Reliant on assistive device for balance Standing balance-Leahy Scale: Poor                              Communication Communication Communication: Impaired Factors Affecting Communication: Other (comment) (minimally verbal)  Cognition Arousal: Alert Behavior During Therapy: Flat affect   PT - Cognitive impairments: No apparent impairments                         Following commands: Intact      Cueing Cueing Techniques: Verbal cues  Exercises      General Comments General comments (skin integrity, edema, etc.): pt on 3.5L [place], SpO2 at 90% at rest, ranges from 88-91% with activity during session      Pertinent Vitals/Pain Pain Assessment Pain  Assessment: Faces Faces Pain Scale: Hurts little more Pain Location: R side Pain Descriptors / Indicators: Sore Pain Intervention(s): Monitored during session    Home Living                          Prior Function            PT Goals (current goals can now be found in the care plan section) Acute Rehab PT Goals Patient Stated Goal: to feel better Progress towards PT goals: Not progressing toward goals - comment (remains limited by fatigue)     Frequency    Min 2X/week      PT Plan      Co-evaluation              AM-PAC PT 6 Clicks Mobility   Outcome Measure  Help needed turning from your back to your side while in a flat bed without using bedrails?: A Little Help needed moving from lying on your back to sitting on the side of a flat bed without using bedrails?: A Little Help needed moving to and from a bed to a chair (including a wheelchair)?: A Little Help needed standing up from a chair using your arms (e.g., wheelchair or bedside chair)?: A Little Help needed to walk in hospital room?: Total Help needed climbing 3-5 steps with a railing? : Total 6 Click Score: 14    End of Session Equipment Utilized During Treatment: Oxygen Activity Tolerance: Patient limited by fatigue Patient left: in bed;with call bell/phone within reach;with family/visitor present Nurse Communication: Mobility status PT Visit Diagnosis: Unsteadiness on feet (R26.81);Other abnormalities of gait and mobility (R26.89);Muscle weakness (generalized) (M62.81);Pain;Difficulty in walking, not elsewhere classified (R26.2)     Time: 9071-9050 PT Time Calculation (min) (ACUTE ONLY): 21 min  Charges:    $Therapeutic Activity: 8-22 mins PT General Charges $$ ACUTE PT VISIT: 1 Visit                     Bernardino JINNY Ruth, PT, DPT Acute Rehabilitation Office [phone]    Bernardino JINNY Ruth 10/08/2024, 11:30 AM

## 2024-10-08 NOTE — Progress Notes (Signed)
 [name] husband requested albuterol  treatment for patient.  Upon assessment, saturations 93-94% on 3L per [place], noted ^ RR and work of breathing. Lungs with bibasilar rales and scattered expiratory wheezes.  Albuterol  nebulizer given and message sent to Dr. Marylu with new orders received for lasix  and blood gas.  Lasix  given per orders and RT obtained ABG per new orders.  Will continue to monitor closely.

## 2024-10-08 NOTE — Progress Notes (Signed)
 "  HD#3 SUBJECTIVE:  Patient Summary: Caitlin [name] is a 69 y.o. 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  Overnight Events: RN noted pt had increased WOB, was confused. Night team Gave lasix . ABG looked appropriate. Checked pt around 1:30 am and she was doing well.   Interim History:  Saw patient at bedside this a.m.  Patient reports feeling fine.  Reports SOB, CP is fine.  Husband at bedside who reports patient felt better after receiving Lasix  last night.  OBJECTIVE:  Vital Signs: Vitals:   10/07/24 2320 10/08/24 0325 10/08/24 0732 10/08/24 0900  BP: 112/78 115/73  100/71  Pulse: 80 82 84 87  Resp: 19 18 15  (!) 28  Temp: 97.7 F (36.5 C) 98.1 F (36.7 C)  97.6 F (36.4 C)  TempSrc: Oral Oral  Oral  SpO2: 97% 95% 94% 94%  Weight:      Height:       Supplemental O2: Nasal Cannula SpO2: 94 % O2 Flow Rate (L/min): 3.5 L/min FiO2 (%): (!) 2 %  Filed Weights   10/04/24 0810  Weight: 60.3 kg     Intake/Output Summary (Last 24 hours) at 10/08/2024 1026 Last data filed at 10/08/2024 0303 Gross per 24 hour  Intake 462.58 ml  Output 975 ml  Net -512.42 ml   Net IO Since Admission: 855.32 mL [10/08/24 1026]  Physical Exam: Physical Exam Constitutional:      Appearance: She is ill-appearing.     Comments: Ill-appearing woman, was resting comfortably in bed, in no respiratory distress  HENT:     Head: Normocephalic.  Cardiovascular:     Rate and Rhythm: Normal rate and regular rhythm.  Pulmonary:     Effort: Pulmonary effort is normal. No respiratory distress.     Comments: Breath sounds normal anteriorly Musculoskeletal:     Right  lower leg: No edema.     Left lower leg: No edema.     Comments: RUE edema present     Patient Lines/Drains/Airways Status     Active Line/Drains/Airways     Name Placement date Placement time Site Days   Peripheral IV 10/04/24 20 G 1.75 Left Antecubital 10/04/24  0901  Antecubital  4   Peripheral IV 10/04/24 20 G 1.88 Anterior;Left;Proximal;Upper Arm 10/04/24  1136  Arm  4   Chest Tube Left;Lateral Pleural 15.5 Fr. 08/28/24  1700  Pleural  41   Wound 10/05/24 1345 Burn Flank Right;Upper;Anterior;Posterior 10/05/24  1345  Flank  3            Pertinent labs and imaging:     Latest Ref Rng & Units 10/08/2024    5:27 AM 10/07/2024    4:00 AM 10/06/2024    6:14 AM  CBC  WBC 4.0 - 10.5 K/uL 22.2  18.4  14.7   Hemoglobin 12.0 - 15.0 g/dL 89.9  9.6  9.8   Hematocrit 36.0 - 46.0 % 31.6  29.8  30.5   Platelets 150 - 400 K/uL 620  670  710        Latest Ref Rng & Units 10/08/2024    5:27 AM 10/07/2024    4:00 AM 10/06/2024    6:14 AM  CMP  Glucose 70 -  99 mg/dL 93  87  80   BUN 8 - 23 mg/dL 26  30  38   Creatinine 0.44 - 1.00 mg/dL 9.34  9.41  9.12   Sodium 135 - 145 mmol/L 139  139  142   Potassium 3.5 - 5.1 mmol/L 3.9  3.3  4.0   Chloride 98 - 111 mmol/L 97  99  100   CO2 22 - 32 mmol/L 31  30  30    Calcium 8.9 - 10.3 mg/dL 8.3  8.1  8.3     No results found.  ASSESSMENT/PLAN:  Assessment: Principal Problem:   Paroxysmal supraventricular tachycardia Active Problems:   Chronic anticoagulation   Community acquired pneumonia   Metastatic malignant neoplasm (HCC)   Malignant pleural effusion (HCC)   Acute respiratory failure with hypoxia and hypercapnia (HCC)   Pleural effusion   Plan:   Metastatic breast cancer CODE STATUS Patient has triple negative metastatic breast cancer s/p multiple lumpectomies and partial right mastectomy.  She has received chemo, radiation, immunotherapy.  Patient will continue with DNR/DNI as previously documented.  Per discussion with  palliative care, patient would like to continue with treatment for anything that is treatable.  Plan is for discharge to home with hospice after medical optimization.  Palliative care is making arrangements to see if patient can continue hospice while still receiving palliative chemotherapy.  Patient does not want to escalate to BiPAP in the event of another respiratory decline; she would like to transition to full comfort then.  -Continue to treat the treatable -DNR/DNI  -No BiPAP in the event of respiratory decline, transition to full comfort then -Discharge to Home with hospice pending arrangement - Palliative care following, appreciate recommendations -Tordol every 6 hours as needed - Tylenol  500 mg every 4 hours as needed  BL Lobe PNA L>R BL Pleural Effusion s/p L PleurX cath on 11/25 Respiratory Acidosis with Metabolic Compensation> Improving Leukocytosis Acute on chronic hypoxic respiratory failure on 2-3 L O2 at baseline Improvement in breathing after receiving Lasix  last night.  Discomfort was likely due to fluid overload.  Elevations in WBC count today, will continue treating with Zosyn  and transition to p.o medication upon discharge.  Patient to undergo right-sided PleurX scheduled today.  No BiPAP per discussion with palliative care, as discussed above.  -Continue Zosyn : day 5 -R PleurX catheter placement today 10/08/2024 - Appreciate PCCM recommendations -Albuterol  as needed - Pulmicort  daily   SVT Recent PE s/p thrombectomy on Eliquis  Patient was transitioned from IV amiodarone  to p.o. taper.  Will continue with amiodarone  per cardiology recommendations. - Continue amiodarone  taper - Continue home Eliquis  after PleurX catheter placement -Check daily electrolytes -Appreciate cardiology recommendations   Hx PAF No signs of PAF during this admission. - Restart Eliquis  after PleurX catheter placement today - Continue with amiodarone , per above   Peripheral Edema Lower  extremity edema has improved completely.  RUE remains the same per patient which is likely due to her partial right mastectomy.  Will continue with 20 mg Lasix  for 1 to 2 days, then can transition back to as needed, per cardiology.  Monitor urine response to 20 mg tonight. - Lasix  20 mg for 1-2 days -Monitor urine output overnight   Hx Chronic Constipation --Miralax  PRN -Senokot daily   Macrocytic anemia  B12 levels are normal and folate levels are high.  Hemoglobin levels are uptrending.  Will monitor levels for now. - Daily CBCs   Hypothyroidism  --Continue home synthroid  100 mcg daily  Best Practice: Diet: Normal IVF: None  VTE: Lovenox  Code: DNR/DNI Family Update: Husband at bedside     Dispo: Anticipated discharge to Home in 1 day pending adequate management and hospice arrangement.  Signature:  Rebecka Edgardo [facility]  10:26 AM, 10/08/2024  On Call pager [phone]  "

## 2024-10-08 NOTE — Progress Notes (Signed)
 10/08/2024  Seen in f/u for effusion  S: No events, some increasing fullness, discomfort in R chest similar to when fluid builds back up.  O:    10/08/2024    9:00 AM 10/08/2024    7:32 AM 10/08/2024    3:25 AM  Vitals with BMI  Systolic 100  115  Diastolic 71  73  Pulse 87 84 82   No distress Reduced breath sounds bases Marked R arm anasarca noted  A: Malignant bilateral effusions; L pleurodeses, R recurrent and symptomatic Recent PE on AC  P: Left pleurX removal and Right pleurX placement Okay to start eliquis  this evening if no bleeding from site Will check on tomorrow to assure everything is settled Plan is home hospice, husband can manage pleurX and reach out to our office if any issues; want to try to reduce her OP office visit burden so she can be with family   Rolan Sharps MD Pulmonary Critical Care Medicine Secure[employer] if during day (7a-7p) [phone] if after hours (7p-7a)

## 2024-10-08 NOTE — Procedures (Signed)
 10/08/2024 Left pleurX removal note Timeout performed Consent signed  Indication: malignant effusion, pleurodesed  Procedure description: dressing removed, sutures removed, pleurX catheter removed using steadily increasing traction.  Removed en bloc.  Some serous fluid from size afterwards.  Dressing applied.  Can change as needed. Discussed with husband.  Complications none Blood loss minimal  Rolan Sharps MD PCCM

## 2024-10-09 ENCOUNTER — Other Ambulatory Visit (HOSPITAL_COMMUNITY): Payer: Self-pay

## 2024-10-09 ENCOUNTER — Encounter: Payer: Self-pay | Admitting: Hematology and Oncology

## 2024-10-09 ENCOUNTER — Encounter (HOSPITAL_COMMUNITY): Payer: Self-pay | Admitting: Internal Medicine

## 2024-10-09 DIAGNOSIS — J910 Malignant pleural effusion: Secondary | ICD-10-CM | POA: Diagnosis not present

## 2024-10-09 DIAGNOSIS — C50911 Malignant neoplasm of unspecified site of right female breast: Secondary | ICD-10-CM | POA: Diagnosis not present

## 2024-10-09 DIAGNOSIS — R6520 Severe sepsis without septic shock: Secondary | ICD-10-CM

## 2024-10-09 DIAGNOSIS — I471 Supraventricular tachycardia, unspecified: Secondary | ICD-10-CM | POA: Diagnosis not present

## 2024-10-09 DIAGNOSIS — Z515 Encounter for palliative care: Secondary | ICD-10-CM

## 2024-10-09 DIAGNOSIS — A419 Sepsis, unspecified organism: Secondary | ICD-10-CM | POA: Diagnosis not present

## 2024-10-09 DIAGNOSIS — J189 Pneumonia, unspecified organism: Secondary | ICD-10-CM | POA: Diagnosis not present

## 2024-10-09 DIAGNOSIS — C799 Secondary malignant neoplasm of unspecified site: Secondary | ICD-10-CM | POA: Diagnosis not present

## 2024-10-09 DIAGNOSIS — Z17421 Hormone receptor negative with human epidermal growth factor receptor 2 negative status: Secondary | ICD-10-CM | POA: Diagnosis not present

## 2024-10-09 DIAGNOSIS — Z66 Do not resuscitate: Secondary | ICD-10-CM

## 2024-10-09 LAB — CBC
HCT: 31.1 % — ABNORMAL LOW (ref 36.0–46.0)
Hemoglobin: 9.8 g/dL — ABNORMAL LOW (ref 12.0–15.0)
MCH: 31.5 pg (ref 26.0–34.0)
MCHC: 31.5 g/dL (ref 30.0–36.0)
MCV: 100 fL (ref 80.0–100.0)
Platelets: 586 K/uL — ABNORMAL HIGH (ref 150–400)
RBC: 3.11 MIL/uL — ABNORMAL LOW (ref 3.87–5.11)
RDW: 17.3 % — ABNORMAL HIGH (ref 11.5–15.5)
WBC: 20.9 K/uL — ABNORMAL HIGH (ref 4.0–10.5)
nRBC: 0 % (ref 0.0–0.2)

## 2024-10-09 LAB — BASIC METABOLIC PANEL WITH GFR
Anion gap: 8 (ref 5–15)
BUN: 31 mg/dL — ABNORMAL HIGH (ref 8–23)
CO2: 33 mmol/L — ABNORMAL HIGH (ref 22–32)
Calcium: 8.2 mg/dL — ABNORMAL LOW (ref 8.9–10.3)
Chloride: 99 mmol/L (ref 98–111)
Creatinine, Ser: 0.68 mg/dL (ref 0.44–1.00)
GFR, Estimated: >60 mL/min
Glucose, Bld: 86 mg/dL (ref 70–99)
Potassium: 4.1 mmol/L (ref 3.5–5.1)
Sodium: 140 mmol/L (ref 135–145)

## 2024-10-09 LAB — CULTURE, BLOOD (SINGLE)
Culture: NO GROWTH
Special Requests: ADEQUATE

## 2024-10-09 LAB — MAGNESIUM: Magnesium: 2.2 mg/dL (ref 1.7–2.4)

## 2024-10-09 MED ORDER — LEVOFLOXACIN 750 MG PO TABS
750.0000 mg | ORAL_TABLET | Freq: Every day | ORAL | 0 refills | Status: AC
  Filled 2024-10-09: qty 2, 2d supply, fill #0

## 2024-10-09 MED ORDER — FUROSEMIDE 40 MG PO TABS
40.0000 mg | ORAL_TABLET | Freq: Every day | ORAL | 0 refills | Status: DC | PRN
  Filled 2024-10-09: qty 30, 30d supply, fill #0

## 2024-10-09 MED ORDER — AMIODARONE HCL 400 MG PO TABS
400.0000 mg | ORAL_TABLET | Freq: Every day | ORAL | 0 refills | Status: DC
  Filled 2024-10-09: qty 7, 7d supply, fill #0

## 2024-10-09 MED ORDER — AMIODARONE HCL 200 MG PO TABS
200.0000 mg | ORAL_TABLET | Freq: Every day | ORAL | 0 refills | Status: DC
  Filled 2024-10-09: qty 44, 44d supply, fill #0

## 2024-10-09 MED ORDER — AMIODARONE HCL 400 MG PO TABS
400.0000 mg | ORAL_TABLET | Freq: Two times a day (BID) | ORAL | 0 refills | Status: DC
  Filled 2024-10-09: qty 7, 4d supply, fill #0

## 2024-10-09 MED ORDER — FUROSEMIDE 40 MG PO TABS: 40.0000 mg | ORAL_TABLET | Freq: Every day | ORAL | Status: DC | PRN

## 2024-10-09 MED ORDER — OXYCODONE HCL 5 MG PO TABS
5.0000 mg | ORAL_TABLET | ORAL | 0 refills | Status: AC | PRN
  Filled 2024-10-09: qty 42, 7d supply, fill #0

## 2024-10-09 NOTE — Telephone Encounter (Signed)
 RN attempted to contact pt via phone call to notify her that per MD her infusion was cancelled due to her active infection. No answer,couldn't leave voicemail. Responded via [employer].

## 2024-10-09 NOTE — Consult Note (Signed)
 WOC Nurse Consult Note: Reason for Consult:right sided chest/axillary wound assessment ; image in chart  Wound type:  full and partial thickness wound in setting of a large infiltrating mass right latissimus dorsi muscle, wound extension from right chest wall through axilla to the right upper back, several pustules have now opened since WOC note dated 1/5.  Pressure Injury POA: NA- not pressure Measurement: see nursing flow sheets Wound bed: intense dark red area of discoloration with pustules present though out, as well as areas of black necrotic tissue, scattered pustules have opened Drainage (amount, consistency, odor) serous/ purulent Periwound: intense erythema surrounding pustules Dressing procedure/placement/frequency:  Patients spouse is present at bedside at time of visit, discussed current wound care.  Will maintain current contact dressing as Xeroform, but remove use of silicone foam and add abd pad and Kerlix wrapped around trunk to help maintain in place.  Discussed options for abd binder vs ace wrap, however patient and spouse felt this type of wrap may be too restrictive/irritating and would like to focus on comfort.  Plan of care discussed with bedside nursing.  Cleanse with NS, pat dry. Place Xeroform over wound, cover with ABD pad and wrap with Kerlix around truck to help hold in place, avoid placing tape directly on skin.  Ensure Xeroform and ABD pad is tucked into the axilla.     WOC team will not follow patient at this time, please re consult if new needs arise.  Thank you,  Doyal Polite, MSN, RN, [facility] WOC Team [phone] (Available Mon-Fri 0700-1500)

## 2024-10-09 NOTE — Progress Notes (Signed)
"   Progress Note  Patient Name: Caitlin [name] Date of Encounter: 10/09/2024  Primary Cardiologist:   None   Subjective  NAEOV S/p interval removal of L chest tube with placement of R PleurX catheter; 900 cc dark fluid removed. F/u CXR negative for PTX.  Seen this AM with husband (Dr. Soja) at bedside. Reports improved breathing. UOP 200 cc measured; additional ~4-5 unmeasured voids.  Inpatient Medications    Scheduled Meds:  [START ON 10/27/2024] amiodarone   200 mg Oral Daily   amiodarone   400 mg Oral BID   [START ON 10/19/2024] amiodarone   400 mg Oral Daily   apixaban   5 mg Oral BID   budesonide  (PULMICORT ) nebulizer solution  0.25 mg Nebulization Daily   feeding supplement  237 mL Oral BID BM   furosemide   20 mg Oral Daily   levothyroxine   100 mcg Oral Q0600   polyethylene glycol  17 g Oral Daily   senna-docusate  1 tablet Oral BID   Continuous Infusions:  piperacillin -tazobactam (ZOSYN )  IV 3.375 g (10/09/24 0423)   PRN Meds: acetaminophen  **OR** acetaminophen , albuterol , ketorolac , oxyCODONE , oxyCODONE    Vital Signs    Vitals:   10/08/24 1929 10/08/24 2326 10/09/24 0401 10/09/24 0742  BP: (!) 97/55 (!) 99/56 (!) 98/56   Pulse: 79 69 83 80  Resp: 18 18 18 15   Temp: 97.7 F (36.5 C) 98 F (36.7 C) 98.1 F (36.7 C)   TempSrc: Oral Oral Oral   SpO2: 96% 95% 96% 99%  Weight:      Height:        Intake/Output Summary (Last 24 hours) at 10/09/2024 0817 Last data filed at 10/09/2024 0500 Gross per 24 hour  Intake 515.98 ml  Output 200 ml  Net 315.98 ml   Filed Weights   10/04/24 0810 10/08/24 1253  Weight: 60.3 kg 60.3 kg    Telemetry    Normal sinus rhythm- Personally Reviewed  ECG    NA - Personally Reviewed  Physical Exam   GEN: Well nourished, well developed in no acute distress HEENT: Normal NECK: No JVD; No carotid bruits LYMPHATICS: No lymphadenopathy CARDIAC:RRR, no murmurs, rubs, gallops RESPIRATORY: Clear lungs bilaterally,  decreased air movement.  No wheezes ABDOMEN: Soft, non-tender, non-distended MUSCULOSKELETAL:  No edema; No deformity  SKIN: Warm and dry NEUROLOGIC:  Alert and oriented x 3 PSYCHIATRIC:  Normal affect    Labs    Chemistry Recent Labs  Lab 10/04/24 0902 10/04/24 0912 10/05/24 1945 10/06/24 0614 10/07/24 0400 10/08/24 0527 10/09/24 0441  NA 139   < >  --    < > 139 139 140  K 4.9   < >  --    < > 3.3* 3.9 4.1  CL 100   < >  --    < > 99 97* 99  CO2 26   < >  --    < > 30 31 33*  GLUCOSE 106*   < >  --    < > 87 93 86  BUN 32*   < >  --    < > 30* 26* 31*  CREATININE 0.64   < >  --    < > 0.58 0.65 0.68  CALCIUM 9.1   < >  --    < > 8.1* 8.3* 8.2*  PROT 6.5  --  5.7*  --   --   --   --   ALBUMIN 3.2*  --   --   --   --   --   --  AST 44*  --   --   --   --   --   --   ALT 11  --   --   --   --   --   --   ALKPHOS 116  --   --   --   --   --   --   BILITOT <0.2  --   --   --   --   --   --   GFRNONAA >60   < >  --    < > >60 >60 >60  ANIONGAP 13   < >  --    < > 10 11 8    < > = values in this interval not displayed.     Hematology Recent Labs  Lab 10/07/24 0400 10/08/24 0527 10/09/24 0441  WBC 18.4* 22.2* 20.9*  RBC 3.08* 3.20* 3.11*  HGB 9.6* 10.0* 9.8*  HCT 29.8* 31.6* 31.1*  MCV 96.8 98.8 100.0  MCH 31.2 31.3 31.5  MCHC 32.2 31.6 31.5  RDW 17.5* 17.5* 17.3*  PLT 670* 620* 586*    Cardiac EnzymesNo results for input(s): TROPONINI in the last 168 hours. No results for input(s): TROPIPOC in the last 168 hours.   BNPNo results for input(s): BNP, PROBNP in the last 168 hours.   DDimer No results for input(s): DDIMER in the last 168 hours.   Radiology    DG CHEST PORT 1 VIEW Result Date: 10/08/2024 EXAM: 1 VIEW(S) XRAY OF THE CHEST 10/08/2024 02:38:00 PM COMPARISON: 10/07/2024 CLINICAL HISTORY: Chest tube in place FINDINGS: LINES, TUBES AND DEVICES: Left chest tube has been removed. There is a new chest tube on the right with distal tip projecting  over the right lung apex. LUNGS AND PLEURA: There are some patchy and strandy opacities in the lung bases which are similar to prior. Small pleural effusions appear unchanged from prior. No pneumothorax. HEART AND MEDIASTINUM: The heart is enlarged. BONES AND SOFT TISSUES: The osseous structures are stable. Right axillary surgical clips are again noted. IMPRESSION: 1. Interval removal of left chest tube and placement of right apical chest tube. No pneumothorax. 2. Small pleural effusions, unchanged. 3. Patchy and strandy bibasilar opacities, similar in appearance. 4. Enlarged heart. Electronically signed by: Greig Pique MD 10/08/2024 03:36 PM EST RP Workstation: [id number]   DG CHEST PORT 1 VIEW Result Date: 10/07/2024 EXAM: 1 VIEW(S) XRAY OF THE CHEST 10/07/2024 09:12:00 AM COMPARISON: 10/06/2024 CLINICAL HISTORY: Dyspnea FINDINGS: LINES, TUBES AND DEVICES: Left chest tube in place. LUNGS AND PLEURA: Moderate bilateral pleural effusions unchanged. Bibasilar opacities. Pulmonary edema stable. No pneumothorax. HEART AND MEDIASTINUM: No acute abnormality of the cardiac and mediastinal silhouettes. BONES AND SOFT TISSUES: No acute osseous abnormality. IMPRESSION: 1. Bibasilar opacities and pulmonary edema, stable. 2. Moderate bilateral pleural effusions, unchanged. 3. Left chest tube in place. Electronically signed by: Waddell Calk MD 10/07/2024 09:30 AM EST RP Workstation: [id number]    Cardiac Studies   Echo:  See above.   Patient Profile     69 y.o. 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 Eben.   Her husband is Dr. Deward Soja a long time primary  care MD in  [place].   Assessment & Plan    SVT:    - Stable, NSR on telemetry. -  continue amiodarone  400 mg twice dailyx 1 week(end date 1/10),then 400 mg daily x 1 week then 200 mg daily.  PAF:   - No further A-fib on telemetry - Now on p.o. Amio load as above - Continue Eliquis  5 mg twice daily  EDEMA:  - Extremity without edema. - UOP incorrectly charted, diuresed well with home Lasix  20 mg. - Continue Lasix  20 mg PRN  METASTATIC BREAST CANCER RIGHT MALIGNANT PLEURAL EFFUSION: -s/p thoracentesis 1 to of 1.3L of dark amber fluid .  - S/p right PleurX catheter - Patient is DNR limited, going home with hospice.  PULMONARY EMBOLISM -s/p recent thrombectomy - Continue Eliquis  5 mg as above.  Cardiology will sign off.  For questions or updates, please contact [facility] Please consult [email or web address] for contact info under Cardiology/STEMI.    Wylee Dorantes, M.D.  IM Resident, PGY-2 "

## 2024-10-09 NOTE — TOC Transition Note (Signed)
 [facility]) - Discharge Note   Patient Details  Name: Caitlin [name] MRN: [id number] Date of Birth: Jan 12, 1956  [facility] CM/SW Contact:  Marval Gell, RN Phone Number: 10/09/2024, 11:37 AM   Clinical Narrative:     [facility] w patient and spouse at bedside. Spouse confirms that all DME has been delivered to the home, he has portable O2 for transport home at bedside. He states he has 12 pleurex drains at home as well. He is workinh with hospice on external urinary catheter, however this can be completed after DC.  Spouse confirms patient will travel home by car.  Updated attending that DME has been delivered to the home.  LVM with [facility] to notify of potential DC today and to confirm when first home visit will be.   Final next level of care: Home w Hospice Care Barriers to Discharge: No Barriers Identified   Patient Goals and CMS Choice Patient states their goals for this hospitalization and ongoing recovery are:: Plan to return home with hospice          Discharge Placement                       Discharge Plan and Services Additional resources added to the After Visit Summary for     Discharge Planning Services: CM Consult Post Acute Care Choice: Hospice          DME Arranged: Hospital bed, Bedside commode, Lightweight manual wheelchair with seat cushion (overbed table) DME Agency: [facility]er spoke with at [employer] Agency: [facility]. HH Arranged: RN HH Agency: [facility] Date HH Agency Contacted: 10/08/24 Time HH Agency Contacted: 1526 Representative spoke with at [facility] Agency: [facility]  Social Drivers of Health (SDOH) Interventions SDOH Screenings   Food Insecurity: No Food Insecurity (10/05/2024)  Housing: Low Risk (10/05/2024)  Transportation Needs: No Transportation Needs (10/05/2024)  Utilities: Not At Risk (10/05/2024)  Alcohol Screen: Low Risk (05/27/2023)  Depression (PHQ2-9): Low Risk  (05/27/2023)  Financial Resource Strain: Low Risk (09/03/2024)   [facility] from [facility]  Physical Activity: Inactive (09/03/2024)   Received from [facility]  Social Connections: Moderately Integrated (10/05/2024)  Stress: Stress Concern Present (09/03/2024)   Received from [facility]  Tobacco Use: Unknown (10/04/2024)  Health Literacy: Low Risk (11/12/2022)   Received from [facility]     Readmission Risk Interventions    08/28/2024    1:48 PM  Readmission Risk Prevention Plan  Transportation Screening Complete  PCP or Specialist Appt within 5-7 Days Complete  Home Care Screening Complete  Medication Review (RN CM) Complete

## 2024-10-09 NOTE — Evaluation (Signed)
 Clinical/Bedside Swallow Evaluation Patient Details  Name: Caitlin [name] MRN: [id number] Date of Birth: May 30, 1956  Today's Date: 10/09/2024 Time: SLP Start Time (ACUTE ONLY): 9063 SLP Stop Time (ACUTE ONLY): 0944 SLP Time Calculation (min) (ACUTE ONLY): 8 min  Past Medical History:  Past Medical History:  Diagnosis Date   Asthma    Breast cancer (HCC)    right   Complication of anesthesia    Dyspnea    Dysrhythmia    2012   Headache    History of kidney stones    passed   Hypothyroidism    Pneumonia 01/26/2023   PONV (postoperative nausea and vomiting)    rt breast ca 08/2022   Right Breast   Scoliosis    Thyroid  disease    Past Surgical History:  Past Surgical History:  Procedure Laterality Date   APPENDECTOMY     AXILLARY SENTINEL NODE BIOPSY Right 10/14/2022   Procedure: RIGHT AXILLARY SENTINEL NODE BIOPSY;  Surgeon: Ebbie Cough, MD;  Location: [facility] OR;  Service: General;  Laterality: Right;   BREAST BIOPSY Right 09/15/2022   US  RT BREAST BX W LOC DEV 1ST LESION IMG BX SPEC US  GUIDE 09/15/2022 [facility]   BREAST BIOPSY  10/12/2022   US  RT RADIOACTIVE SEED LOC 10/12/2022 [facility] MAMMOGRAPHY   BREAST LUMPECTOMY WITH RADIOACTIVE SEED AND SENTINEL LYMPH NODE BIOPSY Right 10/14/2022   Procedure: RIGHT BREAST LUMPECTOMY WITH RADIOACTIVE SEED;  Surgeon: Ebbie Cough, MD;  Location: [facility] OR;  Service: General;  Laterality: Right;   COLONOSCOPY     IR PERC PLEURAL DRAIN W/INDWELL CATH W/IMG GUIDE  08/28/2024   IR RADIOLOGIST EVAL & MGMT  09/26/2024   IR THORACENTESIS ASP PLEURAL SPACE W/IMG GUIDE  09/07/2024   IR THORACENTESIS ASP PLEURAL SPACE W/IMG GUIDE  09/26/2024   IR THROMBECT PRIM MECH INIT (INCLU) MOD SED  08/25/2024   IR TUNNELED CENTRAL VENOUS CATH PLC W IMG  08/25/2024   IR US  GUIDE VASC ACCESS LEFT  08/25/2024   IR US  GUIDE VASC ACCESS RIGHT  08/25/2024   LAPAROSCOPIC APPENDECTOMY     MASTECTOMY Right    PORT-A-CATH REMOVAL Left 05/03/2023    Procedure: REMOVAL PORT-A-CATH;  Surgeon: Ebbie Cough, MD;  Location: [facility] OR;  Service: General;  Laterality: Left;   PORT-A-CATH REMOVAL Left 12/22/2023   Procedure: REMOVAL PORT-A-CATH;  Surgeon: Ebbie Cough, MD;  Location: [facility];  Service: General;  Laterality: Left;   PORTACATH PLACEMENT Left 10/14/2022   Procedure: INSERTION PORT-A-CATH;  Surgeon: Ebbie Cough, MD;  Location: [facility] OR;  Service: General;  Laterality: Left;   PORTACATH PLACEMENT Left 05/19/2023   Procedure: PORT PLACEMENT WITH ULTRASOUND GUIDANCE;  Surgeon: Ebbie Cough, MD;  Location: [facility];  Service: General;  Laterality: Left;   RE-EXCISION OF BREAST LUMPECTOMY Right 11/08/2022   Procedure: RE-EXCISION OF RIGHT BREAST LUMPECTOMY;  Surgeon: Ebbie Cough, MD;  Location: [facility];  Service: General;  Laterality: Right;   RE-EXCISION OF BREAST LUMPECTOMY Right 11/18/2022   Procedure: RE-EXCISION OF RIGHT BREAST LUMPECTOMY;  Surgeon: Ebbie Cough, MD;  Location: [facility] OR;  Service: General;  Laterality: Right;   SIMPLE MASTECTOMY WITH AXILLARY SENTINEL NODE BIOPSY Right 05/03/2023   Procedure: RIGHT MASTECTOMY;  Surgeon: Ebbie Cough, MD;  Location: [facility] OR;  Service: General;  Laterality: Right;  GEN & PEC BLOCK   WISDOM TOOTH EXTRACTION     HPI:  69 yo female presenting to ED 1/1 with chest tightness, sweats, and cough. Admitted with  LLL pneumonia s/p pluerX placement. PMH is significant for A-fib, end-stage triple negative breast CA s/p R total mastectomy on palliative chemo, lymphedema R arm s/p radiation, hypothyroidism, PE, SVT, BL malignant pleural effusions, and chronic hypoxic respiratory failure on 2-3L oxygen baseline    Assessment / Plan / Recommendation  Clinical Impression  Pt presents with no overt s/s of dysphagia or aspiration, also denying previous concerns. Discussed completing an MBS for objective assessment, which pt  declines at this time. Continue current diet without ongoing SLP f/u. SLP Visit Diagnosis: Dysphagia, unspecified (R13.10)    Aspiration Risk  No limitations    Diet Recommendation           Other Recommendations Oral Care Recommendations: Oral care BID     Swallow Evaluation Recommendations Recommendations: PO diet PO Diet Recommendation: Regular;Thin liquids (Level 0) Liquid Administration via: Cup;Straw Medication Administration: Whole meds with liquid Supervision: Patient able to self-feed Swallowing strategies  : Slow rate;Small bites/sips Postural changes: Position pt fully upright for meals Oral care recommendations: Oral care BID (2x/day)   Assistance Recommended at Discharge    Functional Status Assessment Patient has not had a recent decline in their functional status  Frequency and Duration            Prognosis Prognosis for improved oropharyngeal function: Good Barriers to Reach Goals: Time post onset      Swallow Study   General HPI: 69 yo female presenting to ED 1/1 with chest tightness, sweats, and cough. Admitted with LLL pneumonia s/p pluerX placement. PMH is significant for A-fib, end-stage triple negative breast CA s/p R total mastectomy on palliative chemo, lymphedema R arm s/p radiation, hypothyroidism, PE, SVT, BL malignant pleural effusions, and chronic hypoxic respiratory failure on 2-3L oxygen baseline Type of Study: Bedside Swallow Evaluation Previous Swallow Assessment: none in chart Diet Prior to this Study: Regular;Thin liquids (Level 0) Temperature Spikes Noted: No Respiratory Status: Nasal cannula History of Recent Intubation: No Behavior/Cognition: Alert;Cooperative;Pleasant mood Oral Cavity Assessment: Within Functional Limits Oral Care Completed by SLP: No Oral Cavity - Dentition: Adequate natural dentition Vision: Functional for self-feeding Self-Feeding Abilities: Able to feed self Patient Positioning: Upright in bed Baseline Vocal  Quality: Normal Volitional Cough: Strong Volitional Swallow: Able to elicit    Oral/Motor/Sensory Function Overall Oral Motor/Sensory Function: Within functional limits   Ice Chips Ice chips: Not tested   Thin Liquid Thin Liquid: Within functional limits Presentation: Straw;Self Fed    Nectar Thick Nectar Thick Liquid: Not tested   Honey Thick Honey Thick Liquid: Not tested   Puree Puree: Not tested   Solid     Solid: Within functional limits Presentation: Self Fed      Damien Blumenthal, M.A., CCC-SLP Speech Language Pathology, Acute Rehabilitation Services  Secure Chat preferred [phone]  10/09/2024,10:09 AM

## 2024-10-09 NOTE — Discharge Instructions (Signed)
To Caitlin [name] or their caretakers,  You were recently admitted to [facility] for pneumonia and supraventricular tachycardia.   Continue taking your home medications with the following changes:  Start taking Amiodarone  400mg : Take 1 tablet tonight followed by 1 tablet by mouth 2 times daily starting tomorrow 10/10/24 the last dose being on 10/12/2024-this is for your supraventricular tachycardia Amiodarone  400 mg: Take 1 tablet by mouth daily from 10/28/2024 to 10/19/2024-this for your supraventricular tachycardia Amiodarone  200 mg: Take 1 tablet by mouth daily starting 10/20/2024-this is for your supraventricular tachycardia Levaquin  750 mg tablet: Take 1 tablet by mouth daily for 2 days starting today-this is for your pneumonia Furosemide  40 mg: Take 1 tablet as needed for fluid overload or edema. Oxycodone  5 mg: Take 1 tablet by mouth as needed every 4 hours-this is for pain Stop taking Alendronate , azithromycin , cefdinir , ceftriaxone , diltiazem , doxycycline  3.  Continue taking: All your other home medications.  We recommend that you also see your primary care doctor in about a week to make sure that you continue to improve. We are so glad that you are feeling better.  Sincerely,  [facility]

## 2024-10-09 NOTE — Discharge Summary (Signed)
 "  Name: Caitlin [name] MRN: [id number] DOB: 1956/04/27 69 y.o. PCP: Caitlin Jerel MATSU, MD  Date of Admission: 10/04/2024  8:07 AM Date of Discharge: 10/09/24 Attending Physician: Dr. Jone [name]  Discharge Diagnosis: 1. Principal Problem:   Paroxysmal supraventricular tachycardia Active Problems:   Chronic anticoagulation   Community acquired pneumonia   Metastatic malignant neoplasm (HCC)   Malignant pleural effusion (HCC)   Acute respiratory failure with hypoxia and hypercapnia (HCC)   Pleural effusion    Discharge Medications: Allergies as of 10/09/2024       Reactions   Penicillins Rash   childhood reaction        Medication List     STOP taking these medications    alendronate  70 MG tablet Commonly known as: Fosamax    azithromycin  250 MG tablet Commonly known as: ZITHROMAX    cefdinir  300 MG capsule Commonly known as: OMNICEF    cefTRIAXone  1 g injection Commonly known as: ROCEPHIN    diltiazem  120 MG 24 hr capsule Commonly known as: CARDIZEM  CD   diltiazem  30 MG tablet Commonly known as: CARDIZEM    doxycycline  100 MG capsule Commonly known as: VIBRAMYCIN        TAKE these medications    acetaminophen  500 MG tablet Commonly known as: TYLENOL  Take 500 mg by mouth every 6 (six) hours as needed for moderate pain (pain score 4-6).   albuterol  108 (90 Base) MCG/ACT inhaler Commonly known as: VENTOLIN  HFA Inhale 2 puffs into the lungs every 4 (four) hours as needed for shortness of breath (Asthma).   amiodarone  200 MG tablet Commonly known as: PACERONE  Take 2 tablets (400mg ) by mouth twice a day for 4 days, then take 2 tablets (400mg ) daily for 7 days and then take 1 tablet (200mg ) daily. Start taking on: October 27, 2024   Apixaban  Starter Pack (10mg  and 5mg ) Commonly known as: ELIQUIS  STARTER PACK Take as directed on package: start with two-5mg  tablets twice daily for 7 days. On day 8, switch to one-5mg  tablet twice daily. What changed:  how  much to take how to take this when to take this additional instructions Notes to patient: Two tablets twice daily till Saturday, 10/16/2024 AM, then switch to One tablet twice daily from the night of 10/16/2024.   cetirizine 10 MG tablet Commonly known as: ZYRTEC Take 10 mg by mouth daily.   dexamethasone  4 MG tablet Commonly known as: DECADRON  Take 1 tablets  by mouth daily for 3 days starting the day after chemotherapy. Take with food.   docusate sodium  100 MG capsule Commonly known as: COLACE Take 100 mg by mouth 2 (two) times daily as needed for mild constipation.   furosemide  40 MG tablet Commonly known as: LASIX  Take 1 tablet (40 mg total) by mouth daily as needed for fluid or edema (SOB). What changed:  how much to take when to take this reasons to take this   levofloxacin  750 MG tablet Commonly known as: Levaquin  Take 1 tablet (750 mg total) by mouth daily for 2 days.   levothyroxine  100 MCG tablet Commonly known as: SYNTHROID  Take 100 mcg by mouth daily.   lidocaine -prilocaine  cream Commonly known as: EMLA  Apply to affected area once   magnesium citrate Soln Take 1 Bottle by mouth as needed for severe constipation or moderate constipation.   ondansetron  8 MG tablet Commonly known as: Zofran  Take 1 tablet (8 mg total) by mouth every 8 (eight) hours as needed for nausea or vomiting. Start on the third day after chemotherapy.  OVER THE COUNTER MEDICATION Take 1 capsule by mouth 2 (two) times daily. copaiba softgels   oxyCODONE  5 MG immediate release tablet Commonly known as: Oxy IR/ROXICODONE  Take 1 tablet (5 mg total) by mouth every 4 (four) hours as needed for up to 7 days for severe pain (pain score 7-10). What changed: when to take this   polyethylene glycol 17 g packet Commonly known as: MIRALAX  / GLYCOLAX  Take 17 g by mouth daily as needed for moderate constipation.   prochlorperazine  10 MG tablet Commonly known as: COMPAZINE  Take 1 tablet (10 mg  total) by mouth every 6 (six) hours as needed for nausea or vomiting.   Pulmicort  Flexhaler 180 MCG/ACT inhaler Generic drug: budesonide  Inhale 1 puff into the lungs daily.               Discharge Care Instructions  (From admission, onward)           Start     Ordered   10/09/24 0000  Discharge wound care:       Comments: R chest wall/ axilla/ back: Cleanse with NS, pat dry. Place Xeroform over wound, cover with ABD pad and wrap with Kerlix around truck to help hold in place, avoid placing tape directly on skin.  Ensure Xeroform and ABD pad is tucked into the axilla.   10/09/24 1208   10/09/24 0000  Discharge wound care:       Comments: R chest wall/ axilla/ back: Cleanse with NS, pat dry. Place Xeroform over wound, cover with ABD pad and wrap with Kerlix around truck to help hold in place, avoid placing tape directly on skin.  Ensure Xeroform and ABD pad is tucked into the axilla.   10/09/24 1213            Disposition and follow-up:   Caitlin [name] was discharged from [facility] in Stable condition.    Please review assessment and plan regarding CODE STATUS and further management of her critical illness.   Hospital Course by problem list:  Caitlin [name] is a 69 y.o. 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 on palliative chemotherapy, RUE lymphedema, PAF (on Eliquis ), recent massive PE s/p thrombectomy (08/25/24), malignant pleural effusions s/p L PleurX, chronic hypoxic respiratory failure (on 2-3L O2), hypothyroidism, asthma who presented with SOB chest tightness and was admitted for bilateral lobe pneumonia and SVT   Sepsis BL Lobe PNA L>R BL Pleural Effusion s/p L PleurX cath on 11/25 Respiratory Acidosis with Metabolic Compensation> Improving Leukocytosis Acute on chronic hypoxic respiratory failure on 2-3 L O2 at baseline  Patient was seen in the ED on 12/29 and 12/30 for SOB, sweats and cough.  CXR showed  LLL PNA.  She was then treated with Rocephin  and a Zithromax .  Her SOB reoccurred along with chest pressure/increasing oxygen requirement (SVT, see below).  Repeat CXR upon this admission showed opacities in bilateral lobes, left more prominent than right; stable large loculated left pleural effusion and increased moderate right pleural effusion.  Her SOB was likely due to sepsis 2/2 to bilateral pneumonia along with pleural effusions from her malignancy.  Leukocytosis along with lactic acidosis was present which improved throughout the admission.  Patient initially received azithromycin  and CTX, however, was transitioned to Zosyn  for her condition, per pharmacy, along with doxycycline .  [name] condition initially worsened with increased oxygen requirements and respiratory acidosis with metabolic compensation for which pulmonology was consulted.  She was initially placed on BiPAP and  also underwent thoracentesis of R pleural effusion on 10/05/2024 with 1.3 L fluid drained.  [name] breathing continued to get better and she was back to her baseline of around 2L.  She also underwent right PleurX catheter placement on 10/08/2024 with removal of previous left catheter.  On discharge day evaluation, patient reported her breathing was better and she felt well after right pleurX catheter was placed.  She completed a course of doxycycline . she was treated with Zosyn  for 5 days and was d/c with a 2-day course of Levaquin  to complete a total of 7-day course.  Patient was stable on discharge. -2 doses of Levaquin  post-discharge  SVT Hx of PAF Recent PE s/p thrombectomy on Eliquis  Patient had multiple episodes of SVT s/p thrombectomy for which she required cardioversion, adenosine .  All these episodes had occurred within the past month.  Patient presented with tachycardia and SVT with complaints of chest tightness.  She was cardioverted and received adenosine .  Cardiology was consulted for her recurrent SVTs.  Per  cardiology, patient's options were limited due to her underlying illness and hypotension.  They started her on IV amiodarone  and transitioned to p.o. amiodarone  taper dose.  Patient had remote history of PAF but no signs of this admission or recently.  Continued her Eliquis .  On discharge day evaluation, patient's heart rate was controlled. -Amiodarone  taper continued postdischarge with instructions explained in AVS -Continue home Eliquis   Metastatic breast cancer CODE STATUS Patient has triple negative metastatic breast cancer status post multiple lumpectomies and partial right mastectomy.  She has received chemo, radiation, immunotherapy.  Patient had an extensive discussion with palliative care where it was decided that her CODE STATUS would be DNR/DNI but she would like to continue with treatment for anything that was treatable, which includes palliative chemotherapy.  Patient has been opted for discharge to home with hospice after medical optimization.  She was discharged with all the hospice arrangements being made along with appropriate DME requirements.  Patient does not want to escalate to BiPAP in the event of another respiratory decline, she would like to transition to full comfort then.   Right-sided chest/axillary wound 2/2 malignancy Per husband, several pustules from her wound might have started to drain along with some skin breakdown.  Husband and patient stated that wound was not worse but requested to be evaluated by wound care.  See images on chart for more information on wound.  Due to new changes noted, wound care was consulted.  Wound care instructions were provided to the patient and husband in the AVS.  Right upper extremity edema 2/2 radiation/mastectomy/lumpectomy Present, mainly lymphedema  Chronic constipation Treated with Senokot, MiraLAX   Normocytic anemia/anemia of chronic disease Thrombocytosis Stable with no signs of bleed.  Hypothyroidism - Continue with  Synthroid  100 mcg daily    Subjective Saw patient this a.m.  Husband present at bedside.  Patient reports she has been feeling better after the right  PleurX cath placement.  Denies any SOB or pain.  Husband and patient requested wound care to evaluate her right sided chest/axillary wound with concerns of minor wound changes.  Discharge Exam:   BP (!) 99/54 (BP Location: Right Leg)   Pulse 87   Temp 98.1 F (36.7 C) (Oral)   Resp 16   Ht 5' 3 (1.6 m)   Wt 60.3 kg   SpO2 99%   BMI 23.55 kg/m  Discharge exam:  GEN: Tired appearing female, resting comfortably in bed Cardiovascular: RRR-no M/R/G Pulmonary: CTAB-anterior Skin: Extensive  wound on right chest/axillary region with some pustules that have began to drain along with some skin breakdown.  No sign of bloody, pustular discharge or other signs of infection noted on the right PleurX Catheter site. MSK: RUE swelling noted  Pertinent Labs, Studies, and Procedures:     Latest Ref Rng & Units 10/09/2024    4:41 AM 10/08/2024    5:27 AM 10/07/2024    4:00 AM  CBC  WBC 4.0 - 10.5 K/uL 20.9  22.2  18.4   Hemoglobin 12.0 - 15.0 g/dL 9.8  89.9  9.6   Hematocrit 36.0 - 46.0 % 31.1  31.6  29.8   Platelets 150 - 400 K/uL 586  620  670        Latest Ref Rng & Units 10/09/2024    4:41 AM 10/08/2024    5:27 AM 10/07/2024    4:00 AM  CMP  Glucose 70 - 99 mg/dL 86  93  87   BUN 8 - 23 mg/dL 31  26  30    Creatinine 0.44 - 1.00 mg/dL 9.31  9.34  9.41   Sodium 135 - 145 mmol/L 140  139  139   Potassium 3.5 - 5.1 mmol/L 4.1  3.9  3.3   Chloride 98 - 111 mmol/L 99  97  99   CO2 22 - 32 mmol/L 33  31  30   Calcium 8.9 - 10.3 mg/dL 8.2  8.3  8.1     DG CHEST PORT 1 VIEW Result Date: 10/05/2024 CLINICAL DATA:  Status post right thoracentesis EXAM: PORTABLE CHEST 1 VIEW COMPARISON:  Same day FINDINGS: Right pleural effusion is significantly smaller. No definite evidence of thoracentesis. IMPRESSION: Right pleural effusion is significantly smaller.  No definite evidence of thoracentesis. Electronically Signed   By: Lynwood Landy Raddle M.D.   On: 10/05/2024 17:49   DG CHEST PORT 1 VIEW Result Date: 10/05/2024 CLINICAL DATA:  Hypoxia EXAM: PORTABLE CHEST 1 VIEW COMPARISON:  Yesterday FINDINGS: Stable cardiomediastinal silhouette. Stable left-sided chest tube. Stable probable loculated bilateral pleural effusions. Increased bilateral lung opacities are noted concerning for edema or possibly inflammation. Moderate dextroscoliosis of thoracic spine is noted. IMPRESSION: Stable left-sided chest tube. Stable loculated bilateral pleural effusions. Increased bilateral lung opacities are noted concerning for edema or possibly inflammation. Electronically Signed   By: Lynwood Landy Raddle M.D.   On: 10/05/2024 15:34   ECHOCARDIOGRAM COMPLETE Result Date: 10/04/2024    ECHOCARDIOGRAM REPORT   Patient Name:   SHARIECE VIVEIROS Date of Exam: 10/04/2024 Medical Rec #:  [id number]        Height:       63.0 in Accession #:    [id number]       Weight:       133.0 lb Date of Birth:  Mar 09, 1956       BSA:          1.626 m Patient Age:    30 years         BP:           133/81 mmHg Patient Gender: F                HR:           87 bpm. Exam Location:  Inpatient Procedure: 2D Echo, Cardiac Doppler and Color Doppler (Both Spectral and Color            Flow Doppler were utilized during procedure). Indications:    SVT (Supraventricular Tachycardia)  History:        Patient has prior history of Echocardiogram examinations, most                 recent 08/26/2024. Arrythmias:Atrial Fibrillation. Malignant                 neoplasm of upper-outer quadrant of right breast in female,                 estrogen receptor positive (HCC). S/P mastectomy, right. Hx of                 Pulmonary embolism (HCC). Port-A-Cath in place. Chronic                 anticoagulation.  Sonographer:    Aida Pizza RCS Referring Phys: [id number] GEORGANNA ARCHER IMPRESSIONS  1. Left ventricular ejection fraction, by  estimation, is 60 to 65%. The left ventricle has normal function. The left ventricle has no regional wall motion abnormalities. There is mild concentric left ventricular hypertrophy. Left ventricular diastolic parameters were normal.  2. Right ventricular systolic function is normal. The right ventricular size is normal. There is normal pulmonary artery systolic pressure.  3. A small pericardial effusion is present. The pericardial effusion is circumferential. There is no evidence of cardiac tamponade. Large pleural effusion.  4. The mitral valve is normal in structure. Trivial mitral valve regurgitation. No evidence of mitral stenosis.  5. The aortic valve is tricuspid. Aortic valve regurgitation is mild. No aortic stenosis is present.  6. The inferior vena cava is normal in size with greater than 50% respiratory variability, suggesting right atrial pressure of 3 mmHg. Comparison(s): Changes from prior study are noted. Small circumferential pericardial effusion now present. FINDINGS  Left Ventricle: Left ventricular ejection fraction, by estimation, is 60 to 65%. The left ventricle has normal function. The left ventricle has no regional wall motion abnormalities. The left ventricular internal cavity size was normal in size. There is  mild concentric left ventricular hypertrophy. Left ventricular diastolic parameters were normal. Right Ventricle: The right ventricular size is normal. No increase in right ventricular wall thickness. Right ventricular systolic function is normal. There is normal pulmonary artery systolic pressure. The tricuspid regurgitant velocity is 1.92 m/s, and  with an assumed right atrial pressure of 3 mmHg, the estimated right ventricular systolic pressure is 17.7 mmHg. Left Atrium: Left atrial size was normal in size. Right Atrium: Right atrial size was normal in size. Pericardium: A small pericardial effusion is present. The pericardial effusion is circumferential. There is no evidence of  cardiac tamponade. Mitral Valve: The mitral valve is normal in structure. Trivial mitral valve regurgitation. No evidence of mitral valve stenosis. Tricuspid Valve: The tricuspid valve is normal in structure. Tricuspid valve regurgitation is mild . No evidence of tricuspid stenosis. Aortic Valve: The aortic valve is tricuspid. Aortic valve regurgitation is mild. No aortic stenosis is present. Aortic valve mean gradient measures 4.0 mmHg. Aortic valve peak gradient measures 9.2 mmHg. Aortic valve area, by VTI measures 2.35 cm. Pulmonic Valve: The pulmonic valve was normal in structure. Pulmonic valve regurgitation is not visualized. No evidence of pulmonic stenosis. Aorta: The aortic root and ascending aorta are structurally normal, with no evidence of dilitation. Venous: The inferior vena cava is normal in size with greater than 50% respiratory variability, suggesting right atrial pressure of 3 mmHg. IAS/Shunts: No atrial level shunt detected by color flow Doppler. Additional Comments: There is a large pleural effusion.  LEFT VENTRICLE PLAX  2D LVIDd:         4.00 cm   Diastology LVIDs:         2.70 cm   LV e' medial:    8.81 cm/s LV PW:         1.20 cm   LV E/e' medial:  12.9 LV IVS:        1.10 cm   LV e' lateral:   13.20 cm/s LVOT diam:     1.90 cm   LV E/e' lateral: 8.6 LV SV:         75 LV SV Index:   46 LVOT Area:     2.84 cm  RIGHT VENTRICLE RV S prime:     13.40 cm/s TAPSE (M-mode): 1.9 cm LEFT ATRIUM             Index        RIGHT ATRIUM           Index LA diam:        2.40 cm 1.48 cm/m   RA Area:     13.70 cm LA Vol (A2C):   39.4 ml 24.23 ml/m  RA Volume:   35.70 ml  21.96 ml/m LA Vol (A4C):   36.7 ml 22.57 ml/m LA Biplane Vol: 38.1 ml 23.43 ml/m  AORTIC VALVE AV Area (Vmax):    2.40 cm AV Area (Vmean):   2.36 cm AV Area (VTI):     2.35 cm AV Vmax:           152.00 cm/s AV Vmean:          91.700 cm/s AV VTI:            0.318 m AV Peak Grad:      9.2 mmHg AV Mean Grad:      4.0 mmHg LVOT Vmax:          128.50 cm/s LVOT Vmean:        76.200 cm/s LVOT VTI:          0.264 m LVOT/AV VTI ratio: 0.83  AORTA Ao Root diam: 3.40 cm MITRAL VALVE                TRICUSPID VALVE MV Area (PHT): 3.21 cm     TR Peak grad:   14.7 mmHg MV Decel Time: 236 msec     TR Vmax:        192.00 cm/s MV E velocity: 114.00 cm/s MV A velocity: 82.30 cm/s   SHUNTS MV E/A ratio:  1.39         Systemic VTI:  0.26 m                             Systemic Diam: 1.90 cm Georganna Archer Electronically signed by Georganna Archer Signature Date/Time: 10/04/2024/5:35:59 PM    Final    DG Chest Port 1 View Result Date: 10/04/2024 EXAM: 1 VIEW(S) XRAY OF THE CHEST 10/04/2024 08:56:00 AM COMPARISON: 10/02/2024 CLINICAL HISTORY: dyspnea FINDINGS: LINES, TUBES AND DEVICES: Left PleurX catheter stable in position. LUNGS AND PLEURA: Stable large loculated left pleural effusion. Increased moderate right pleural effusion. Increased diffuse interstitial opacities and patchy opacities throughout left lung and within right lower lung. No pneumothorax. HEART AND MEDIASTINUM: No acute abnormality of the cardiac and mediastinal silhouettes. BONES AND SOFT TISSUES: Stable dextrocurvature of thoracic spine. No acute osseous abnormality. IMPRESSION: 1. Increased diffuse interstitial and patchy opacities throughout the left lung and within  the right lower lung. 2. Stable large loculated left pleural effusion and increased moderate right pleural effusion. Electronically signed by: Lonni Necessary MD 10/04/2024 10:00 AM EST RP Workstation: [id number]     Discharge Instructions: Discharge Instructions     Discharge wound care:   Complete by: As directed    R chest wall/ axilla/ back: Cleanse with NS, pat dry. Place Xeroform over wound, cover with ABD pad and wrap with Kerlix around truck to help hold in place, avoid placing tape directly on skin.  Ensure Xeroform and ABD pad is tucked into the axilla.   Discharge wound care:   Complete by: As directed    R  chest wall/ axilla/ back: Cleanse with NS, pat dry. Place Xeroform over wound, cover with ABD pad and wrap with Kerlix around truck to help hold in place, avoid placing tape directly on skin.  Ensure Xeroform and ABD pad is tucked into the axilla.   Increase activity slowly   Complete by: As directed    Increase activity slowly   Complete by: As directed        Signed: Edgardo Pontiff, DO 10/09/2024, 10:01 PM     "

## 2024-10-10 ENCOUNTER — Telehealth: Payer: Self-pay

## 2024-10-10 ENCOUNTER — Inpatient Hospital Stay: Admitting: Pulmonary Disease

## 2024-10-10 LAB — CYTOLOGY - NON PAP

## 2024-10-10 MED FILL — Fosaprepitant Dimeglumine For IV Infusion 150 MG (Base Eq): INTRAVENOUS | Qty: 5 | Status: AC

## 2024-10-10 NOTE — Transitions of Care (Post Inpatient/ED Visit) (Signed)
 10/10/2024  Patient ID: Caitlin [name] Soja, female   DOB: 05-05-1956, 69 y.o.   MRN: [id number]  [facility] RN CM made unsuccessful attempt to reach spouse and left a confidential voice message.   TOC RN made outreach to verify receipt of hospice referral and start of care. RN confirmed that patient is actively enrolled in hospice program.  Hospice Agency: [facility]  Hospice at [facility] RN spoke with: Referral Desk [phone] Start of Care Date: First onsite visit 10/10/2024, Contact made 10/09/2024  No further interventions at this time.  Bing Edison MSN, RN RN [employer] [facility]  [facility] Office Hours M-F [phone] Direct Dial: [phone] Main Phone [phone]  Fax: [phone] [email or web address]

## 2024-10-11 ENCOUNTER — Inpatient Hospital Stay

## 2024-10-11 ENCOUNTER — Inpatient Hospital Stay: Admitting: Hematology and Oncology

## 2024-10-11 ENCOUNTER — Inpatient Hospital Stay: Attending: Hematology and Oncology

## 2024-10-11 ENCOUNTER — Inpatient Hospital Stay: Attending: Hematology and Oncology | Admitting: Hematology and Oncology

## 2024-10-11 DIAGNOSIS — Z170 Estrogen receptor positive status [ER+]: Secondary | ICD-10-CM | POA: Diagnosis not present

## 2024-10-11 DIAGNOSIS — Z1722 Progesterone receptor negative status: Secondary | ICD-10-CM | POA: Diagnosis not present

## 2024-10-11 DIAGNOSIS — C50411 Malignant neoplasm of upper-outer quadrant of right female breast: Secondary | ICD-10-CM

## 2024-10-11 DIAGNOSIS — G893 Neoplasm related pain (acute) (chronic): Secondary | ICD-10-CM | POA: Diagnosis not present

## 2024-10-11 DIAGNOSIS — Z1732 Human epidermal growth factor receptor 2 negative status: Secondary | ICD-10-CM

## 2024-10-11 DIAGNOSIS — J90 Pleural effusion, not elsewhere classified: Secondary | ICD-10-CM | POA: Diagnosis not present

## 2024-10-11 LAB — CHOLESTEROL, BODY FLUID: Cholesterol, Fluid: 90 mg/dL

## 2024-10-11 NOTE — Progress Notes (Signed)
 HEMATOLOGY-ONCOLOGY TELEPHONE VISIT PROGRESS NOTE  I connected with our patient on 10/11/2024 at  9:30 AM EST by telephone and verified that I am speaking with the correct person using two identifiers.  I discussed the limitations, risks, security and privacy concerns of performing an evaluation and management service by telephone and the availability of in person appointments.  I also discussed with the patient that there may be a patient responsible charge related to this service. The patient expressed understanding and agreed to proceed.   History of Present Illness: Telephone follow-up to discuss treatment plan  History of Present Illness Caitlin [name] is a 69 year old female with metastatic estrogen receptor positive breast cancer and recurrent malignant pleural effusions who presents for follow-up of symptom management and recent hospitalizations.  She was recently hospitalized for placement of a right-sided pleural drainage tube with 900 cc evacuated. Since discharge she has had recurrent chest tightness and dyspnea, requiring home drainage of 950 cc and then 675 cc the day before this visit, with plans for further drainage as needed due to persistent symptoms.  She is receiving home hospice care. Hospice staff visit regularly, her father performs daily bandage changes, and she uses a hospital bed, bedside bathroom, and supplemental oxygen. She has poor appetite.  She has not received chemotherapy this week and is undecided about further treatment, as prior chemotherapy gave partial pain relief but caused adverse effects. Her family is considering scheduling chemotherapy for next week to preserve the option, with the plan to cancel if she chooses not to proceed. Her priority is comfort and quality of life.  Sep 04, 2024: Follow-up after recent hospitalization to discuss ongoing treatment plan for metastatic ER+ breast cancer. Enhertu  chemotherapy initiated (cycle 2 scheduled for 09/07/2024),  management of malignant pleural effusion with PleurX catheter, and chronic pain controlled with oxycodone . Chemotherapy-induced nausea and fatigue managed with ondansetron  and prochlorperazine ; recent SVT episode addressed by adjusting diltiazem  and levothyroxine  dosages.    Oncology History  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female, estrogen receptor positive (HCC)  09/15/2022 Initial Diagnosis   Screening mammogram detected right breast mass which measured 1.4 cm by ultrasound at 10 o'clock position, biopsy revealed grade 3 IDC with lymphovascular invasion ER 10%, PR 0%, Ki-67 30%, HER2 0 negative   09/25/2022 Cancer Staging   Staging form: Breast, AJCC 8th Edition - Clinical: Stage IB (cT1c, cN0, cM0, G3, ER+, PR-, HER2-) - Signed by Odean Potts, MD on 09/25/2022 Histologic grading system: 3 grade system    Genetic Testing   [facility] CancerNext-Expanded Panel+RNA was Negative. Report date is 10/06/2021.  The CancerNext-Expanded gene panel offered by [facility] and includes sequencing, rearrangement, and RNA analysis for the following 77 genes: AIP, ALK, APC, ATM, AXIN2, BAP1, BARD1, BLM, BMPR1A, BRCA1, BRCA2, BRIP1, CDC73, CDH1, CDK4, CDKN1B, CDKN2A, CHEK2, CTNNA1, DICER1, FANCC, FH, FLCN, GALNT12, KIF1B, LZTR1, MAX, MEN1, MET, MLH1, MSH2, MSH3, MSH6, MUTYH, NBN, NF1, NF2, NTHL1, PALB2, PHOX2B, PMS2, POT1, PRKAR1A, PTCH1, PTEN, RAD51C, RAD51D, RB1, RECQL, RET, SDHA, SDHAF2, SDHB, SDHC, SDHD, SMAD4, SMARCA4, SMARCB1, SMARCE1, STK11, SUFU, TMEM127, TP53, TSC1, TSC2, VHL and XRCC2 (sequencing and deletion/duplication); EGFR, EGLN1, HOXB13, KIT, MITF, PDGFRA, POLD1, and POLE (sequencing only); EPCAM and GREM1 (deletion/duplication only).    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 with high-grade DCIS, inferior margin positive, angiolymphatic invasion present, 5/5 lymph nodes  positive, additional medial margin: Poorly differentiated carcinoma with  lymphoid stroma, additional inferior margin: Positive, ER 10% weak, PR 0%, HER2 0, Ki-67 30%   10/21/2022 Cancer Staging   Staging form: Breast, AJCC 8th Edition - Pathologic: Stage IIIC (pT1c, pN2a, cM0, G3, ER-, PR-, HER2-) - Signed by Odean Potts, MD on 10/21/2022 Stage prefix: Initial diagnosis Histologic grading system: 3 grade system   11/08/2022 Surgery   Margin reexcision: Inferior margin: Grade 3 IDC, new margin positive Medial margin: Grade 3 IDC new margin widely involved   11/18/2022 Surgery   Right inferior margin reexcision: Invasive poorly differentiated adenocarcinoma grade 3 present in new inferior margin.  Medial margin reexcision: Invasive poorly differentiated adenocarcinoma grade 3 present at new medial margin, posterior margin: Poorly differentiated adenocarcinoma grade 3, margin negative  11/08/2022 and 11/18/2022: Final margin focal positivity. After much discussion the plan is to finish her systemic treatment and come back at a later point to do mastectomy.    12/02/2022 - 04/18/2023 Chemotherapy   Patient is on Treatment Plan : BREAST ADJUVANT DOSE DENSE AC q14d / PACLitaxel  q7d     05/03/2023 Surgery   Right mastectomy: 8 cm grade 3 IDC with extensive lymphatic and vascular invasion RCB class III, deep margin multifocally positive, 5/5 lymph nodes positive, ER weak 10%, PR 0%, HER2 0, Ki-67 30%   05/25/2023 - 08/17/2023 Chemotherapy   Patient is on Treatment Plan : BREAST Pembrolizumab  (200) q21d x 24 months     05/25/2023 -  Adjuvant Chemotherapy   Capectiabine 1000mg  po BID 2 weeks on, 1 week off; began 05/25/2023, second cycle delayed to 07/06/2023   06/08/2023 - 07/21/2023 Radiation Therapy   First Treatment Date: 2023-06-08 - Last Treatment Date: 2023-07-21   Plan Name: CW_R_BO Site: Chest Wall, Right Technique: 3D Mode: Photon Dose Per Fraction: 2 Gy Prescribed Dose (Delivered /  Prescribed): 26 Gy / 26 Gy Prescribed Fxs (Delivered / Prescribed): 13 / 13   Plan Name: CW_R_SCV_PAB Site: Chest Wall, Right Technique: 3D Mode: Photon Dose Per Fraction: 2 Gy Prescribed Dose (Delivered / Prescribed): 50 Gy / 50 Gy Prescribed Fxs (Delivered / Prescribed): 25 / 25   Plan Name: CW_R_Bst_BO Site: Chest Wall, Right Technique: Electron Mode: Electron Dose Per Fraction: 2 Gy Prescribed Dose (Delivered / Prescribed): 14 Gy / 14 Gy Prescribed Fxs (Delivered / Prescribed): 7 / 7   Plan Name: CW_R Site: Chest Wall, Right Technique: 3D Mode: Photon Dose Per Fraction: 2 Gy Prescribed Dose (Delivered / Prescribed): 24 Gy / 24 Gy Prescribed Fxs (Delivered / Prescribed): 12 / 12   08/09/2024 -  Chemotherapy   Patient is on Treatment Plan : BREAST Fam-Trastuzumab Deruxtecan-nxki  (Enhertu ) (5.4) q21d       REVIEW OF SYSTEMS:   Constitutional: Denies fevers, chills or abnormal weight loss All other systems were reviewed with the patient and are negative. Observations/Objective:     Assessment Plan:  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female, estrogen receptor positive (HCC) 10/14/2022:Right lumpectomy: 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  No distant metastatic disease. Bone scan 10/29/2022: No evidence of bone metastasis  11/08/2022 and 11/18/2022: Margin reexcision surgeries: Final margin focal positivity.  After much discussion  the plan is to finish her systemic treatment and come back at a later point to do mastectomy.   Treatment summary: Adjuvant chemotherapy with dose dense Adriamycin  and Cytoxan  followed by Taxol  to be completed 05/02/2023, Keytruda  05/25/2023-08/17/2023 (discontinued due to immune mediated adverse effects: Skin rash) Mastectomy by Dr. Ebbie 05/03/2023:Right mastectomy: 8 cm grade 3 IDC with extensive lymphatic and vascular invasion RCB class III, deep margin multifocally positive, 5/5 lymph nodes positive, ER weak 10%, PR 0%, HER2 0, Ki-67 30%, total lymph nodes: 10/10 (prior lumpectomy she had 5 positive nodes) Adjuvant radiation to start 06/09/2023-07/21/23 Capecitabine  started 05/25/2023-11/25/2023 ------------------------------------------------------------------------------------------------------------------------------------------------ Guardant reveal 12/14/2023: CT DNA detected 2.7% 02/16/2024: CT CAP: Interval enlargement of left superior mediastinal lymph node 2 x 1.8 cm concerning for metastatic disease   Right axillary lymph node biopsy: Positive for metastatic ductal carcinoma ER 0%, PR 0%, HER2 0, Ki-67 50% Guardant 360 12/26/2023: T p53 present, TMB 5.69, MSI high not detected   07/30/2024: CT CAP: New irregular soft tissue mass anterior mediastinum, increased in size and number of lower cervical, mediastinal, left hilar and left axillary nodes.  Large infiltrating mass right latissimus dorsi muscle which is up to intercostal muscles, right upper lobe lymphangitic carcinomatosis changes, no bone metastasis   Hospitalization: 08/25/24-08/29/2024: massive PE S/P thrombectomy (SVT S/P cardioversion), Pleurex Hospitalization [facility]: 09/03/2024-09/04/2024: SVT S/P Adenosine  and Diltiazem    Current treatment: Enhertu  started 08/09/2024 (today is cycle 3) If she is able to tolerate these treatments, and she is responding to the treatment, we may be able to place a port in the future. Echocardiogram  07/27/2024: EF 60 to 65%   Chemotoxicities: Profound nausea and vomiting that lasted a full week after her chemo.  I sent a renewal for Compazine  Generalized fatigue and weight loss Pleural effusions: Left PleurX catheter   Hospitalization: 10/04/2024-10/09/2024: SVT bilateral pneumonia, right PleurX catheter placement  Goals of care discussion: I connected with the patient and her son by telephone to discuss the treatment plan.  They have hospice care assisting them every 2 to 3 days.  The question is whether or not to receive another round of chemo.  I provided my opinion that additional chemotherapy would not be of any significant benefit to her longevity of life.  I anticipate that she has only a few months of survival whether we do the treatment or not.  For now they would like to move the chemo appointment to next week and then make a final decision on chemotherapy sometime in the next few days. Assessment & Plan Metastatic estrogen receptor positive breast cancer Advanced metastatic disease with poor prognosis and limited benefit from further chemotherapy. - Scheduled chemotherapy for next week as an option, with the understanding that it can be canceled if she declines to proceed. - Discussed that further chemotherapy is unlikely to provide significant benefit or alter disease trajectory.  Malignant pleural effusion Recurrent right-sided effusions requiring frequent drainage for symptom relief, indicating disease progression. - Continue regular drainage for symptomatic relief as needed.  Chronic pain due to malignancy Chronic pain partially responsive to chemotherapy, requiring ongoing management for comfort.  Palliative care (hospice) Enrolled in hospice with focus on comfort and quality of life, with limited prognosis. - Continue hospice care at home with regular visits and support. - Encouraged family to contact hospice with any needs or questions.      I discussed the  assessment and treatment plan  with the patient. The patient was provided an opportunity to ask questions and all were answered. The patient agreed with the plan and demonstrated an understanding of the instructions. The patient was advised to call back or seek an in-person evaluation if the symptoms worsen or if the condition fails to improve as anticipated.   I provided 20 minutes of non-face-to-face time during this encounter.  This includes time for charting and coordination of care   Naomi MARLA Chad, MD

## 2024-10-11 NOTE — Assessment & Plan Note (Signed)
 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  No distant metastatic disease. Bone scan 10/29/2022: No evidence of bone metastasis   11/08/2022 and 11/18/2022: Margin reexcision surgeries: Final margin focal positivity.  After much discussion the plan is to finish her systemic treatment and come back at a later point to do mastectomy.   Treatment summary: Adjuvant chemotherapy with dose dense Adriamycin  and Cytoxan  followed by Taxol  to be completed 05/02/2023, Keytruda  05/25/2023-08/17/2023 (discontinued due to immune mediated adverse effects: Skin rash) Mastectomy by Dr. Ebbie 05/03/2023:Right mastectomy: 8 cm grade 3 IDC with extensive lymphatic and vascular invasion RCB class III, deep margin multifocally positive, 5/5 lymph nodes positive, ER weak 10%, PR 0%, HER2 0, Ki-67 30%, total lymph nodes: 10/10 (prior lumpectomy she had 5 positive nodes) Adjuvant radiation to start 06/09/2023-07/21/23 Capecitabine  started 05/25/2023-11/25/2023 ------------------------------------------------------------------------------------------------------------------------------------------------ Guardant reveal 12/14/2023: CT DNA detected 2.7% 02/16/2024: CT CAP: Interval enlargement of left superior mediastinal lymph node 2 x 1.8 cm concerning for metastatic disease   Right axillary lymph node biopsy: Positive for metastatic ductal carcinoma ER 0%, PR 0%, HER2 0, Ki-67 50% Guardant  360 12/26/2023: T p53 present, TMB 5.69, MSI high not detected   07/30/2024: CT CAP: New irregular soft tissue mass anterior mediastinum, increased in size and number of lower cervical, mediastinal, left hilar and left axillary nodes.  Large infiltrating mass right latissimus dorsi muscle which is up to intercostal muscles, right upper lobe lymphangitic carcinomatosis changes, no bone metastasis   Hospitalization: 08/25/24-08/29/2024: massive PE S/P thrombectomy (SVT S/P cardioversion), Pleurex Hospitalization [facility]: 09/03/2024-09/04/2024: SVT S/P Adenosine  and Diltiazem    Current treatment: Enhertu  started 08/09/2024 (today is cycle 3) If she is able to tolerate these treatments, and she is responding to the treatment, we may be able to place a port in the future. Echocardiogram 07/27/2024: EF 60 to 65%   Chemotoxicities: Profound nausea and vomiting that lasted a full week after her chemo.  I sent a renewal for Compazine  Generalized fatigue and weight loss Pleural effusions: Left PleurX catheter   Hospitalization: 10/04/2024-10/09/2024: SVT bilateral pneumonia, right PleurX catheter placement  Plan to obtain scans to assess response to treatment

## 2024-10-12 ENCOUNTER — Telehealth: Payer: Self-pay

## 2024-10-12 NOTE — Telephone Encounter (Signed)
 Copied from CRM [id number]. Topic: General - Other >> Oct 08, 2024 10:51 AM Ismael A wrote: Reason for CRM: pt's husband Deward, called in to acknowledge Dr. Catherine for everything he has done for his wife and wants to thank him and the team for everything - pt currently hospitalized and will be discharged soon to hospice  Attn Alghanim

## 2024-10-12 NOTE — Telephone Encounter (Signed)
That's cool! Thank you!

## 2024-10-13 ENCOUNTER — Other Ambulatory Visit: Payer: Self-pay

## 2024-10-16 ENCOUNTER — Inpatient Hospital Stay: Admitting: Hematology and Oncology

## 2024-10-16 ENCOUNTER — Inpatient Hospital Stay

## 2024-10-17 MED FILL — Fosaprepitant Dimeglumine For IV Infusion 150 MG (Base Eq): INTRAVENOUS | Qty: 5 | Status: AC

## 2024-10-17 NOTE — Progress Notes (Signed)
Mild protein calorie malnutrition

## 2024-10-18 ENCOUNTER — Inpatient Hospital Stay: Admitting: Hematology and Oncology

## 2024-10-18 ENCOUNTER — Inpatient Hospital Stay

## 2024-10-24 ENCOUNTER — Inpatient Hospital Stay

## 2024-10-24 ENCOUNTER — Inpatient Hospital Stay: Admitting: Dietician

## 2024-10-24 ENCOUNTER — Inpatient Hospital Stay: Admitting: Hematology and Oncology

## 2024-11-01 ENCOUNTER — Inpatient Hospital Stay

## 2024-11-01 ENCOUNTER — Inpatient Hospital Stay: Admitting: Dietician

## 2024-11-01 ENCOUNTER — Inpatient Hospital Stay: Admitting: Hematology and Oncology

## 2024-11-04 DEATH — deceased

## 2024-11-08 ENCOUNTER — Inpatient Hospital Stay

## 2024-11-08 ENCOUNTER — Inpatient Hospital Stay: Admitting: Dietician

## 2024-11-08 ENCOUNTER — Inpatient Hospital Stay: Admitting: Hematology and Oncology
